# Patient Record
Sex: Male | Born: 1980 | Race: Black or African American | Hispanic: No | Marital: Single | Smoking: Former smoker
Health system: Southern US, Community
[De-identification: ages and names within clinical notes are randomized; demographics above are authoritative.]

## PROBLEM LIST (undated history)

## (undated) DIAGNOSIS — B029 Zoster without complications: Secondary | ICD-10-CM

## (undated) DIAGNOSIS — I469 Cardiac arrest, cause unspecified: Secondary | ICD-10-CM

## (undated) DIAGNOSIS — N139 Obstructive and reflux uropathy, unspecified: Secondary | ICD-10-CM

## (undated) DIAGNOSIS — Z9289 Personal history of other medical treatment: Secondary | ICD-10-CM

## (undated) DIAGNOSIS — A419 Sepsis, unspecified organism: Secondary | ICD-10-CM

## (undated) DIAGNOSIS — R7881 Bacteremia: Secondary | ICD-10-CM

## (undated) DIAGNOSIS — R6521 Severe sepsis with septic shock: Secondary | ICD-10-CM

## (undated) DIAGNOSIS — N4889 Other specified disorders of penis: Secondary | ICD-10-CM

## (undated) DIAGNOSIS — N289 Disorder of kidney and ureter, unspecified: Secondary | ICD-10-CM

## (undated) DIAGNOSIS — M79606 Pain in leg, unspecified: Secondary | ICD-10-CM

## (undated) DIAGNOSIS — B20 Human immunodeficiency virus [HIV] disease: Secondary | ICD-10-CM

## (undated) DIAGNOSIS — N200 Calculus of kidney: Secondary | ICD-10-CM

## (undated) DIAGNOSIS — T80211A Bloodstream infection due to central venous catheter, initial encounter: Secondary | ICD-10-CM

## (undated) DIAGNOSIS — R000 Tachycardia, unspecified: Secondary | ICD-10-CM

## (undated) DIAGNOSIS — N1330 Unspecified hydronephrosis: Secondary | ICD-10-CM

## (undated) DIAGNOSIS — C801 Malignant (primary) neoplasm, unspecified: Secondary | ICD-10-CM

## (undated) DIAGNOSIS — N189 Chronic kidney disease, unspecified: Secondary | ICD-10-CM

## (undated) DIAGNOSIS — I214 Non-ST elevation (NSTEMI) myocardial infarction: Secondary | ICD-10-CM

## (undated) DIAGNOSIS — K529 Noninfective gastroenteritis and colitis, unspecified: Secondary | ICD-10-CM

## (undated) DIAGNOSIS — J9601 Acute respiratory failure with hypoxia: Secondary | ICD-10-CM

## (undated) DIAGNOSIS — L989 Disorder of the skin and subcutaneous tissue, unspecified: Secondary | ICD-10-CM

## (undated) DIAGNOSIS — R740 Nonspecific elevation of levels of transaminase and lactic acid dehydrogenase [LDH]: Secondary | ICD-10-CM

## (undated) HISTORY — DX: Other specified disorders of penis: N48.89

## (undated) HISTORY — DX: Tachycardia, unspecified: R00.0

## (undated) HISTORY — DX: Pain in leg, unspecified: M79.606

## (undated) HISTORY — DX: Human immunodeficiency virus (HIV) disease: B20

## (undated) HISTORY — PX: OTHER SURGICAL HISTORY: SHX169

## (undated) HISTORY — DX: Disorder of the skin and subcutaneous tissue, unspecified: L98.9

---

## 2006-12-12 ENCOUNTER — Emergency Department (HOSPITAL_COMMUNITY): Admission: EM | Admit: 2006-12-12 | Discharge: 2006-12-12 | Payer: Self-pay | Admitting: Family Medicine

## 2007-09-24 ENCOUNTER — Emergency Department (HOSPITAL_COMMUNITY): Admission: EM | Admit: 2007-09-24 | Discharge: 2007-09-24 | Payer: Self-pay | Admitting: Family Medicine

## 2008-06-08 ENCOUNTER — Emergency Department (HOSPITAL_COMMUNITY): Admission: EM | Admit: 2008-06-08 | Discharge: 2008-06-09 | Payer: Self-pay | Admitting: Emergency Medicine

## 2008-08-24 HISTORY — PX: ABCESS DRAINAGE: SHX399

## 2009-07-14 ENCOUNTER — Emergency Department (HOSPITAL_COMMUNITY): Admission: EM | Admit: 2009-07-14 | Discharge: 2009-07-15 | Payer: Self-pay | Admitting: Emergency Medicine

## 2010-08-17 ENCOUNTER — Emergency Department (HOSPITAL_COMMUNITY)
Admission: EM | Admit: 2010-08-17 | Discharge: 2010-08-17 | Payer: Self-pay | Source: Home / Self Care | Admitting: Emergency Medicine

## 2010-09-06 ENCOUNTER — Encounter (HOSPITAL_BASED_OUTPATIENT_CLINIC_OR_DEPARTMENT_OTHER)
Admission: RE | Admit: 2010-09-06 | Discharge: 2010-09-06 | Disposition: A | Discharge status: Home / Self Care | Payer: Medicare Other | Source: Ambulatory Visit | Attending: General Surgery | Admitting: General Surgery

## 2010-09-06 DIAGNOSIS — Z01812 Encounter for preprocedural laboratory examination: Secondary | ICD-10-CM | POA: Insufficient documentation

## 2010-09-06 LAB — CBC
Hemoglobin: 11.6 g/dL — ABNORMAL LOW (ref 13.0–17.0)
MCH: 30.9 pg (ref 26.0–34.0)
MCHC: 33.8 g/dL (ref 30.0–36.0)
Platelets: 203 10*3/uL (ref 150–400)
RDW: 16.4 % — ABNORMAL HIGH (ref 11.5–15.5)
WBC: 5.2 10*3/uL (ref 4.0–10.5)

## 2010-09-06 LAB — DIFFERENTIAL
Lymphocytes Relative: 49 % — ABNORMAL HIGH (ref 12–46)
Lymphs Abs: 2.5 10*3/uL (ref 0.7–4.0)
Neutro Abs: 1.8 10*3/uL (ref 1.7–7.7)
Neutrophils Relative %: 35 % — ABNORMAL LOW (ref 43–77)

## 2010-09-06 LAB — COMPREHENSIVE METABOLIC PANEL
ALT: 24 U/L (ref 0–53)
Creatinine, Ser: 1.03 mg/dL (ref 0.4–1.5)
GFR calc Af Amer: >60 mL/min (ref >60)
GFR calc non Af Amer: >60 mL/min (ref >60)
Glucose, Bld: 98 mg/dL (ref 70–99)
Sodium: 136 mEq/L (ref 135–145)
Total Bilirubin: 0.2 mg/dL — ABNORMAL LOW (ref 0.3–1.2)

## 2010-09-07 ENCOUNTER — Ambulatory Visit (HOSPITAL_BASED_OUTPATIENT_CLINIC_OR_DEPARTMENT_OTHER)
Admission: RE | Admit: 2010-09-07 | Discharge: 2010-09-07 | Disposition: A | Discharge status: Home / Self Care | Payer: Medicare Other | Source: Ambulatory Visit | Attending: General Surgery | Admitting: General Surgery

## 2010-09-07 ENCOUNTER — Other Ambulatory Visit: Payer: Self-pay | Admitting: General Surgery

## 2010-09-07 DIAGNOSIS — K612 Anorectal abscess: Secondary | ICD-10-CM | POA: Insufficient documentation

## 2010-09-07 DIAGNOSIS — K603 Anal fistula, unspecified: Secondary | ICD-10-CM | POA: Insufficient documentation

## 2010-09-07 DIAGNOSIS — F17200 Nicotine dependence, unspecified, uncomplicated: Secondary | ICD-10-CM | POA: Insufficient documentation

## 2010-09-15 NOTE — Op Note (Signed)
NAME:  DARLY, FAILS NO.:  [id number]  MEDICAL RECORD NO.:  [id number]           PATIENT TYPE:  LOCATION:                                 FACILITY:  PHYSICIAN:  Anselm Pancoast. 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  PROCEDURES:  Examination under anesthesia, posterior fistulotomy, and drainage of posterior perirectal and kind of an early ischiorectal abscess.  ANESTHESIA:  General anesthesia.  SURGEON:  Anselm Pancoast. Zachery Dakins, MD  HISTORY:  Thomas [name] is a 30 year old Caucasian male who was referred to me from the emergency room recently with the following history.  He states that for the last 2-3 years he has had this "cyst" and cyst would reoccur.  This has been I and D'ed on several times. This has been over a period of 3-4 years and when I saw him in the office, he had been seen the in previous day or two, and he had not actually got his antibiotic filled.  On examination, you could see this posterior opening probably about an inch to an inch and half from the dentate line area, and I put a probe in the area and you could go and feel a little area of weak in the posterior midline.  He was such that it was not possible to get a seton on him in the office because of the pain, and I recommended that we plan on examining under anesthesia and then discussed with him that if we could identify the internal fistula opening, which I think we could, we will decide whether this could just be a fistulotomy or seton.  He is having a right much drainage and supposedly, he went ahead and got the doxycycline filled and I had scheduled him for the procedure and I think I saw him in the office about 4 days ago.  He is here today, his lab studies are unremarkable, his white count is not elevated  and on examination, you can see right much purulent drainage from this little external fistula opening.  He was taken back to the operative suite and given 1 g of Ancef.  Induction with an LMA tube and then placed up in the Yellowfin stirrups.  With pressing around the perirectal area, you can see purulence coming from this little external opening.  I prepped the external area with Betadine solution and then draped with towels in a sterile manner.  His legs were up in the Yellowfin stirrups and then I placed an anoscope into the anal canal and used a 10 mL syringe filled with Betadine with an Angiocath put it in the little external opening and squeezed it gently and you could see the Betadine solution coming up in this little posterior area where I thought the internal fistula opening was.  I then put a probe through the area and a kind of curved it and it comes up through the little opening and I bent the probe so I could hold it in place.  I then started on the external portion dividing the skin between these little internal and external openings and the most superficial external portion was just skin and subcutaneous tissue but then we got into the actual areas of the sphincters right in the posterior midline.  With the dividing partially weakening this and then I could see that I was getting really right much purulent drainage coming from the posterior aspect and I elected to go ahead and divide the little area, it is not a real high fistula but it does go around truly most of the posterior sphincter.  Then I went ahead and divided the little remaining portion and then you could see the little pocket to the left and right posterior to that.  I am sure he is having the start of a kind of a horseshoe-type abscess.  Then as far as after irrigating everything, washing it, and I used a few little 3-0 chromic sutures on the mucosal edges and cautery in the muscle area and then  reinspected and looked.  The little area, where I think the internal fistula opening is, had what looks like granulation tissue to me and I did send that as a path report.  I do not think this is going to be Crohn disease or anything.  Interesting note is that the patient's father had a similar problem that required 3 or 4 trips to the operating room and ultimately was managed by a colorectal surgeon in [place] but according to the patient's mother, father's was just a kind of a fistula-in-ano in a similar pattern of this reoccurring over several years time.  The procedure was completed, I put on a little 5% lidocaine ointment on a gauze and kind of packed in the little opening for pain control and a little bit of hemostasis and we will start sitz bathing later this evening and we will keep him on amoxicillin for few days.  I will see him back in the office next week.  I think this area will heal now hopefully without recurrent abscess, but the patient will have a little bit of inability to hold his stool for any prolonged periods of time if he would have diarrhea because of the portion of the sphincter that has been divided.    Anselm Pancoast. Zachery Dakins, M.D.     WJW/MEDQ  D:  09/07/2010  T:  09/08/2010  Job:  [id number]  Electronically Signed by Consuello Bossier M.D. on 09/15/2010 09:43:23 AM

## 2010-10-05 ENCOUNTER — Inpatient Hospital Stay (INDEPENDENT_AMBULATORY_CARE_PROVIDER_SITE_OTHER)
Admission: RE | Admit: 2010-10-05 | Discharge: 2010-10-05 | Disposition: A | Discharge status: Home / Self Care | Payer: Medicare Other | Source: Ambulatory Visit

## 2010-10-05 ENCOUNTER — Ambulatory Visit (INDEPENDENT_AMBULATORY_CARE_PROVIDER_SITE_OTHER): Payer: Medicare Other

## 2010-10-05 DIAGNOSIS — S93409A Sprain of unspecified ligament of unspecified ankle, initial encounter: Secondary | ICD-10-CM

## 2011-04-25 LAB — DIFFERENTIAL
Basophils Relative: 2 — ABNORMAL HIGH
Lymphs Abs: 2.4
Monocytes Absolute: 0.5
Monocytes Relative: 8

## 2011-04-25 LAB — RAPID URINE DRUG SCREEN, HOSP PERFORMED
Amphetamines: NOT DETECTED
Barbiturates: NOT DETECTED
Cocaine: NOT DETECTED
Tetrahydrocannabinol: POSITIVE — AB

## 2011-04-25 LAB — POCT CARDIAC MARKERS: CKMB, poc: 4.9

## 2011-04-25 LAB — CBC
HCT: 42.8
MCHC: 34.2
MCV: 99
Platelets: 213
RDW: 12.3

## 2012-02-19 ENCOUNTER — Emergency Department (INDEPENDENT_AMBULATORY_CARE_PROVIDER_SITE_OTHER)
Admission: EM | Admit: 2012-02-19 | Discharge: 2012-02-19 | Disposition: A | Discharge status: Home / Self Care | Payer: Medicare Other | Source: Home / Self Care | Attending: Emergency Medicine | Admitting: Emergency Medicine

## 2012-02-19 ENCOUNTER — Encounter (HOSPITAL_COMMUNITY): Payer: Self-pay | Admitting: Emergency Medicine

## 2012-02-19 DIAGNOSIS — M7750 Other enthesopathy of unspecified foot: Secondary | ICD-10-CM

## 2012-02-19 DIAGNOSIS — R209 Unspecified disturbances of skin sensation: Secondary | ICD-10-CM | POA: Diagnosis not present

## 2012-02-19 DIAGNOSIS — M659 Synovitis and tenosynovitis, unspecified: Secondary | ICD-10-CM

## 2012-02-19 DIAGNOSIS — R202 Paresthesia of skin: Secondary | ICD-10-CM

## 2012-02-19 MED ORDER — MELOXICAM 7.5 MG PO TABS: 7.5000 mg | ORAL_TABLET | Freq: Every day | ORAL | Status: DC

## 2012-02-19 NOTE — ED Provider Notes (Signed)
History     CSN: [id number]  Arrival date & time 02/19/12  1839   First MD Initiated Contact with Patient 02/19/12 1841      Chief Complaint  Patient presents with  . Foot Pain    (Consider location/radiation/quality/duration/timing/severity/associated sxs/prior treatment) HPI Comments: For about 1 year, patient been experiencing R foot pains and numbness to his toes with on and off swelling of his Rankle. Denies any previous injuries like fracatures or sprains that he can remember. He has not seek medical attention or taking anything for it".  The swelling and the numbness have not been constant, it comes and goes,   Patient is a 31 y.o. male presenting with lower extremity pain. The history is provided by the patient.  Foot Pain This is a chronic problem. The current episode started more than 1 week ago. The problem has not changed since onset.Nothing relieves the symptoms. He has tried nothing for the symptoms.    History reviewed. No pertinent past medical history.  History reviewed. No pertinent past surgical history.  No family history on file.  History  Substance Use Topics  . Smoking status: Current Everyday Smoker  . Smokeless tobacco: Not on file  . Alcohol Use: Yes      Review of Systems  Constitutional: Negative for fever, chills, activity change, appetite change and unexpected weight change.  Musculoskeletal: Positive for joint swelling. Negative for back pain and gait problem.  Skin: Negative for color change, pallor, rash and wound.  Neurological: Positive for numbness. Negative for weakness.    Allergies  Review of patient's allergies indicates no known allergies.  Home Medications   Current Outpatient Rx  Name Route Sig Dispense Refill  . MELOXICAM 7.5 MG PO TABS Oral Take 1 tablet (7.5 mg total) by mouth daily. 7 tablet 0    BP 126/89  Pulse 84  Temp 97.5 F (36.4 C) (Oral)  Resp 16  SpO2 100%  Physical Exam  Nursing note and vitals  reviewed. Constitutional: Vital signs are normal. He appears well-developed and well-nourished.  Non-toxic appearance. He does not have a sickly appearance. He does not appear ill.  HENT:  Head: Normocephalic.  Musculoskeletal: He exhibits no edema and no tenderness.       Right ankle: He exhibits normal range of motion, no swelling, no ecchymosis, no deformity, no laceration and normal pulse. No lateral malleolus, no medial malleolus, no AITFL, no CF ligament, no posterior TFL, no head of 5th metatarsal and no proximal fibula tenderness found. Achilles tendon normal.       Feet:  Neurological: He is alert. No cranial nerve deficit. He exhibits normal muscle tone. Coordination normal.  Skin: No rash noted. No erythema.    ED Course  Procedures (including critical care time)  Labs Reviewed - No data to display No results found.   1. Tendinitis of foot   2. Paresthesias       MDM  Foot intermittent swelling and paresthesias to dorsal aspect of R foot ( non-trauma). Meloxicam for 7 days and follow-up with [facility]        Jimmie Molly, MD 02/19/12 1958

## 2012-02-19 NOTE — ED Notes (Signed)
HERE WITH RIGHT FOOT SHARP INTERMIT PAIN RADIATING TO TOES WITH NUMBNESS X 1 YR BUT IS GETTING WORSE.STATES HE ROLLED ANKLE AND THE SWELLING AND DISCOMFORT NEVER IMPROVED.ALSO C/O NUMBNESS IN L FOOT ALSO

## 2012-02-20 ENCOUNTER — Emergency Department (HOSPITAL_COMMUNITY)
Admission: EM | Admit: 2012-02-20 | Discharge: 2012-02-20 | Disposition: A | Discharge status: Home / Self Care | Payer: Medicare Other | Attending: Emergency Medicine | Admitting: Emergency Medicine

## 2012-02-20 ENCOUNTER — Encounter (HOSPITAL_COMMUNITY): Payer: Self-pay | Admitting: Emergency Medicine

## 2012-02-20 DIAGNOSIS — M25579 Pain in unspecified ankle and joints of unspecified foot: Secondary | ICD-10-CM | POA: Insufficient documentation

## 2012-02-20 DIAGNOSIS — F17200 Nicotine dependence, unspecified, uncomplicated: Secondary | ICD-10-CM | POA: Insufficient documentation

## 2012-02-20 DIAGNOSIS — M545 Low back pain, unspecified: Secondary | ICD-10-CM | POA: Diagnosis not present

## 2012-02-20 DIAGNOSIS — M25571 Pain in right ankle and joints of right foot: Secondary | ICD-10-CM

## 2012-02-20 DIAGNOSIS — R109 Unspecified abdominal pain: Secondary | ICD-10-CM | POA: Diagnosis not present

## 2012-02-20 DIAGNOSIS — R197 Diarrhea, unspecified: Secondary | ICD-10-CM | POA: Diagnosis not present

## 2012-02-20 LAB — POCT I-STAT, CHEM 8
BUN: 18 mg/dL (ref 6–23)
Glucose, Bld: 113 mg/dL — ABNORMAL HIGH (ref 70–99)
HCT: 37 % — ABNORMAL LOW (ref 39.0–52.0)
Hemoglobin: 12.6 g/dL — ABNORMAL LOW (ref 13.0–17.0)
TCO2: 28 mmol/L (ref 0–100)

## 2012-02-20 LAB — URINALYSIS, ROUTINE W REFLEX MICROSCOPIC
Glucose, UA: NEGATIVE mg/dL
Leukocytes, UA: NEGATIVE
Nitrite: NEGATIVE
Urobilinogen, UA: 1 mg/dL (ref 0.0–1.0)

## 2012-02-20 LAB — GLUCOSE, CAPILLARY: Glucose-Capillary: 109 mg/dL — ABNORMAL HIGH (ref 70–99)

## 2012-02-20 LAB — URINE MICROSCOPIC-ADD ON

## 2012-02-20 NOTE — ED Provider Notes (Signed)
Medical screening examination/treatment/procedure(s) were performed by non-physician practitioner and as supervising physician I was immediately available for consultation/collaboration.  Doug Sou, MD 02/20/12 [phone]

## 2012-02-20 NOTE — ED Provider Notes (Signed)
History     CSN: [id number]  Arrival date & time 02/20/12  1120   First MD Initiated Contact with Patient 02/20/12 1339      Chief Complaint  Patient presents with  . low back pain   . Abdominal Pain  . Diarrhea  . Ankle Pain    (Consider location/radiation/quality/duration/timing/severity/associated sxs/prior treatment) HPI  31 y/o male INAD c/o right ankle pain x1 year. Pt has not tried any pain medications over that year. Denies remote trauma, Pain is 6/10 worse at night. Low back pain 7 months, Denies fever, Pt reports polyura x2 weeks. Diarrhea over 2 weeks. Abdominal pain x1week 3/10. Pt also reports 70 lb weight loss over 6 months. Denies night sweats, and easy bruising or swollen lymph nodes.  History reviewed. No pertinent past medical history.  History reviewed. No pertinent past surgical history.  No family history on file.  History  Substance Use Topics  . Smoking status: Current Everyday Smoker  . Smokeless tobacco: Not on file  . Alcohol Use: Yes      Review of Systems  Allergies  Review of patient's allergies indicates no known allergies.  Home Medications   Current Outpatient Rx  Name Route Sig Dispense Refill  . MELOXICAM 7.5 MG PO TABS Oral Take 1 tablet (7.5 mg total) by mouth daily. 7 tablet 0    BP 106/75  Pulse 105  Temp 98.5 F (36.9 C) (Oral)  Resp 18  SpO2 100%  Physical Exam  Constitutional: He is oriented to person, place, and time. He appears well-developed and well-nourished. No distress.  HENT:  Head: Normocephalic and atraumatic.  Right Ear: External ear normal.  Left Ear: External ear normal.  Nose: Nose normal.  Eyes: Conjunctivae and EOM are normal. Pupils are equal, round, and reactive to light. No scleral icterus.  Neck: Normal range of motion. Neck supple.  Cardiovascular: Normal rate, regular rhythm, normal heart sounds and intact distal pulses.   Pulmonary/Chest: Effort normal and breath sounds normal. No  respiratory distress. He has no wheezes. He has no rales. He exhibits no tenderness.  Abdominal: Soft. Bowel sounds are normal.  Musculoskeletal: Normal range of motion.  Neurological: He is alert and oriented to person, place, and time.  Skin: Skin is warm and dry. He is not diaphoretic.  Psychiatric: He has a normal mood and affect.    ED Course  Procedures (including critical care time)  Labs Reviewed  URINALYSIS, ROUTINE W REFLEX MICROSCOPIC - Abnormal; Notable for the following:    APPearance CLOUDY (*)     Hgb urine dipstick SMALL (*)     All other components within normal limits  URINE MICROSCOPIC-ADD ON - Abnormal; Notable for the following:    Squamous Epithelial / LPF FEW (*)     Bacteria, UA FEW (*)     All other components within normal limits  GLUCOSE, CAPILLARY - Abnormal; Notable for the following:    Glucose-Capillary 109 (*)     All other components within normal limits   No results found.   1. Right ankle pain       MDM  Patient presenting with multiple complaints. Urinalysis and i-STAT 9 within normal limits. I will Encourage patient to follow with primary care for more thorough evaluation.       Wynetta Emery, PA-C 02/20/12 1431

## 2012-02-20 NOTE — ED Notes (Signed)
Also states has night sweats, every night, denies any high risk behaviors,

## 2012-02-20 NOTE — ED Notes (Signed)
Multiple issues, states that he has lower back pain, body pain, abd pain. Wt loss in the past few months, pt states that he is not on drugs and that he wants to "just make sure he is ok" no n/v toady but in the past.

## 2012-02-20 NOTE — ED Notes (Signed)
States has lost a lot of weight in past few months, polyuria, thirsty all of the time, also has right foot, ankle, and leg pain.

## 2012-04-09 DIAGNOSIS — Z136 Encounter for screening for cardiovascular disorders: Secondary | ICD-10-CM | POA: Diagnosis not present

## 2012-04-09 DIAGNOSIS — M25579 Pain in unspecified ankle and joints of unspecified foot: Secondary | ICD-10-CM | POA: Diagnosis not present

## 2012-04-09 DIAGNOSIS — Z13220 Encounter for screening for lipoid disorders: Secondary | ICD-10-CM | POA: Diagnosis not present

## 2012-04-09 DIAGNOSIS — R634 Abnormal weight loss: Secondary | ICD-10-CM | POA: Diagnosis not present

## 2012-04-09 DIAGNOSIS — Z131 Encounter for screening for diabetes mellitus: Secondary | ICD-10-CM | POA: Diagnosis not present

## 2012-04-16 DIAGNOSIS — Z113 Encounter for screening for infections with a predominantly sexual mode of transmission: Secondary | ICD-10-CM | POA: Diagnosis not present

## 2012-04-16 DIAGNOSIS — L738 Other specified follicular disorders: Secondary | ICD-10-CM | POA: Diagnosis not present

## 2012-04-16 DIAGNOSIS — Z21 Asymptomatic human immunodeficiency virus [HIV] infection status: Secondary | ICD-10-CM | POA: Diagnosis not present

## 2012-05-09 DIAGNOSIS — R634 Abnormal weight loss: Secondary | ICD-10-CM | POA: Diagnosis not present

## 2012-05-09 DIAGNOSIS — Z21 Asymptomatic human immunodeficiency virus [HIV] infection status: Secondary | ICD-10-CM | POA: Diagnosis not present

## 2012-05-16 ENCOUNTER — Ambulatory Visit: Payer: Medicare Other

## 2012-05-17 ENCOUNTER — Telehealth: Payer: Self-pay

## 2012-05-17 NOTE — Telephone Encounter (Signed)
No show for intake. Phone number not working.  Will make Bridge Counselor Referral.    Laurell Josephs, RN

## 2012-05-24 ENCOUNTER — Telehealth: Payer: Self-pay

## 2012-05-24 NOTE — Telephone Encounter (Signed)
I spoke with patient for reschedule.  He states he will keep his June 06, 2012 appointment .   Laurell Josephs, RN

## 2012-06-06 ENCOUNTER — Other Ambulatory Visit (HOSPITAL_COMMUNITY)
Admission: RE | Admit: 2012-06-06 | Discharge: 2012-06-06 | Disposition: A | Discharge status: Home / Self Care | Payer: Medicare Other | Source: Ambulatory Visit | Attending: Infectious Diseases | Admitting: Infectious Diseases

## 2012-06-06 ENCOUNTER — Ambulatory Visit (INDEPENDENT_AMBULATORY_CARE_PROVIDER_SITE_OTHER): Payer: Medicare Other

## 2012-06-06 DIAGNOSIS — E785 Hyperlipidemia, unspecified: Secondary | ICD-10-CM | POA: Diagnosis not present

## 2012-06-06 DIAGNOSIS — Z21 Asymptomatic human immunodeficiency virus [HIV] infection status: Secondary | ICD-10-CM

## 2012-06-06 DIAGNOSIS — Z113 Encounter for screening for infections with a predominantly sexual mode of transmission: Secondary | ICD-10-CM | POA: Insufficient documentation

## 2012-06-06 DIAGNOSIS — Z23 Encounter for immunization: Secondary | ICD-10-CM | POA: Diagnosis not present

## 2012-06-06 DIAGNOSIS — B20 Human immunodeficiency virus [HIV] disease: Secondary | ICD-10-CM

## 2012-06-06 DIAGNOSIS — R3989 Other symptoms and signs involving the genitourinary system: Secondary | ICD-10-CM

## 2012-06-06 DIAGNOSIS — R198 Other specified symptoms and signs involving the digestive system and abdomen: Secondary | ICD-10-CM | POA: Diagnosis not present

## 2012-06-06 LAB — CBC WITH DIFFERENTIAL/PLATELET
MCH: 27.6 pg (ref 26.0–34.0)
MCHC: 32.8 g/dL (ref 30.0–36.0)
Neutro Abs: 1.6 10*3/uL — ABNORMAL LOW (ref 1.7–7.7)
Platelets: 186 10*3/uL (ref 150–400)
WBC: 4.5 10*3/uL (ref 4.0–10.5)

## 2012-06-07 LAB — HEPATITIS C ANTIBODY: HCV Ab: NEGATIVE

## 2012-06-07 LAB — LIPID PANEL
HDL: 12 mg/dL — ABNORMAL LOW (ref >39)
Triglycerides: 286 mg/dL — ABNORMAL HIGH (ref <150)

## 2012-06-07 LAB — T-HELPER CELL (CD4) - (RCID CLINIC ONLY)
CD4 % Helper T Cell: 39 % (ref 33–55)
CD4 T Cell Abs: 850 uL (ref 400–2700)

## 2012-06-07 LAB — COMPLETE METABOLIC PANEL WITH GFR
ALT: 16 U/L (ref 0–53)
AST: 33 U/L (ref 0–37)
Alkaline Phosphatase: 69 U/L (ref 39–117)
BUN: 13 mg/dL (ref 6–23)
CO2: 29 mEq/L (ref 19–32)
Calcium: 9.3 mg/dL (ref 8.4–10.5)
Creat: 0.92 mg/dL (ref 0.50–1.35)
GFR, Est African American: >89 mL/min
GFR, Est Non African American: >89 mL/min
Potassium: 3.7 mEq/L (ref 3.5–5.3)
Sodium: 131 mEq/L — ABNORMAL LOW (ref 135–145)
Total Protein: 9.9 g/dL — ABNORMAL HIGH (ref 6.0–8.3)

## 2012-06-07 LAB — URINALYSIS
Ketones, ur: NEGATIVE mg/dL
Leukocytes, UA: NEGATIVE
Nitrite: NEGATIVE
Protein, ur: 30 mg/dL — AB
Specific Gravity, Urine: 1.017 (ref 1.005–1.030)

## 2012-06-07 LAB — RPR

## 2012-06-07 LAB — HEPATITIS B CORE ANTIBODY, TOTAL: Hep B Core Total Ab: NEGATIVE

## 2012-06-07 LAB — HIV-1 RNA ULTRAQUANT REFLEX TO GENTYP+: HIV-1 RNA Quant, Log: 6 {Log} — ABNORMAL HIGH (ref <1.30)

## 2012-06-10 LAB — HERPES SIMPLEX VIRUS CULTURE: Organism ID, Bacteria: NOT DETECTED

## 2012-06-12 ENCOUNTER — Telehealth: Payer: Self-pay

## 2012-06-12 NOTE — Telephone Encounter (Signed)
Pt called to inform our office he did purchase Medicare Part D plan for $12.00. He will need the name and strengths of medication called to a representative that is asissting him .  Pt was informed he would not have this information until his December visit.    Thomas [name] K Thomas Empson,RN

## 2012-06-13 LAB — HIV-1 GENOTYPR PLUS

## 2012-06-14 NOTE — Progress Notes (Signed)
Pt reports weight loss of 100 lbs in the last  2 months.  He has complaints of night sweats for 1-2 years with insomnia.  He was very afraid and worried when he lost the weight so fast. His appetite is  normal and he eats regularly on a daily basis.  Pt stated he Father thought he was on crack.      Today he reports bumps present on his inner thigh and penis area.  The bumps per pt are raised bumps filled with "white "stiff". . Upon exam pt has 6-10 raised areas on inner right thigh and smaller bumps present on penis.  The bumps on his thigh appear to be the size pf a pencil eraser. To me it looks like molluscum contagiosum.  The  bumps present on penis  are smaller fluid filled bumps.    Cultures taken from thigh area and penile area.   Dr Luciana Axe will examine this area when he returns for office visit .  Laurell Josephs, RN

## 2012-06-25 ENCOUNTER — Other Ambulatory Visit: Payer: Self-pay | Admitting: *Deleted

## 2012-06-25 ENCOUNTER — Ambulatory Visit: Payer: Medicare Other

## 2012-06-25 ENCOUNTER — Encounter: Payer: Self-pay | Admitting: Internal Medicine

## 2012-06-25 ENCOUNTER — Ambulatory Visit (INDEPENDENT_AMBULATORY_CARE_PROVIDER_SITE_OTHER): Payer: Medicare Other | Admitting: Internal Medicine

## 2012-06-25 ENCOUNTER — Telehealth: Payer: Self-pay

## 2012-06-25 VITALS — BP 135/83 | HR 126 | Temp 97.4°F | Ht 73.0 in | Wt 144.0 lb

## 2012-06-25 DIAGNOSIS — B20 Human immunodeficiency virus [HIV] disease: Secondary | ICD-10-CM

## 2012-06-25 DIAGNOSIS — Z23 Encounter for immunization: Secondary | ICD-10-CM

## 2012-06-25 HISTORY — DX: Human immunodeficiency virus (HIV) disease: B20

## 2012-06-25 MED ORDER — SULFAMETHOXAZOLE-TMP DS 800-160 MG PO TABS: 1.0000 | ORAL_TABLET | Freq: Two times a day (BID) | ORAL | Status: DC

## 2012-06-25 MED ORDER — EFAVIRENZ-EMTRICITAB-TENOFOVIR 600-200-300 MG PO TABS: 1.0000 | ORAL_TABLET | Freq: Every day | ORAL | Status: DC

## 2012-06-25 MED ORDER — ENSURE PO LIQD: 237.0000 mL | Freq: Two times a day (BID) | ORAL | Status: DC

## 2012-06-25 NOTE — Progress Notes (Signed)
  Subjective:    Patient ID: Thomas [name], male    DOB: 1981/01/30, 31 y.o.   MRN: [id number]  HPI He comes in as a new patient. He was recently diagnosed by his primary physician with HIV. He was seen his physician do to about 2 months of significant weight loss and fatigue. He was concerned about diabetes at the time but the positive HIV was found. His CD4 is in the normal range and his viral load is over 1 million. He does have lesions on his penis and also rash on his face. He previously had never been tested for HIV. He has been seen in intake but is asking about his future her ability to have kids. He is here with his father who is aware of his diagnosis and is supportive. He has no other known medical problems. He did have a bacterial culture of his penile lesions or intake which have grown staph aureus. The HSV culture is negative. He does not have any pain or discharge in his penis.   Review of Systems  Constitutional: Positive for activity change, fatigue and unexpected weight change. Negative for fever, chills and appetite change.  HENT: Negative for sore throat and trouble swallowing.   Respiratory: Negative for cough and shortness of breath.   Cardiovascular: Negative for chest pain, palpitations and leg swelling.  Gastrointestinal: Negative for nausea, abdominal pain, diarrhea and abdominal distention.  Genitourinary: Positive for genital sores.  Musculoskeletal: Negative for myalgias, joint swelling and arthralgias.  Skin: Positive for rash.  Neurological: Negative for dizziness and headaches.  Hematological: Negative for adenopathy.       Objective:   Physical Exam  Constitutional: He appears well-developed and well-nourished. No distress.  HENT:  Mouth/Throat: Oropharynx is clear and moist. No oropharyngeal exudate.  Eyes: No scleral icterus.  Cardiovascular: Normal rate, regular rhythm and normal heart sounds.  Exam reveals no gallop and no friction rub.   No murmur  heard. Pulmonary/Chest: Effort normal and breath sounds normal. No respiratory distress. He has no wheezes. He has no rales.  Abdominal: Soft. Bowel sounds are normal. He exhibits no distension. There is no tenderness. There is no rebound.  Genitourinary:       Has multiple 1-2 mm lesions that have the appearance of molluscum contagiosum. No pustule lesions  Lymphadenopathy:    He has no cervical adenopathy.  Neurological: He is alert.  Skin:       He has a facial rash that is micropapular, no erythema  Psychiatric: He has a normal mood and affect. His behavior is normal.          Assessment & Plan:

## 2012-06-25 NOTE — Patient Instructions (Addendum)
Start the medicine today 

## 2012-06-25 NOTE — Telephone Encounter (Signed)
Patient came in to apply for SPAP (newly diagnosed - saw Dr today) and did not bring disability letter for 2013  income - was given a list of what to bring before appt., but said he could not find it - father was with patient and said they would go by social security office - so, rescheduled appt for 12/10 at 2pm as father said they needed time to get the letter - Tammy from [facility] called asking about it as his stepmother had called her - told her what happened and also said would check with Randolm Idol to see if he qualified for the emergency atripla for 1 month - was able to get it and called stepmother to pick up voucher and prescription at front desk - advised Tammy at [facility] as well.

## 2012-06-25 NOTE — Telephone Encounter (Signed)
Patient called to ask where his medication was sent to and advised him [facility] on [address]. He advised he needs it sent to the [facility] on [address]. Advised he will [facility] the [facility] at the [facility] today but in the future could we send meds to [facility], advised him yes. Changed the pharmacy in the system.

## 2012-06-25 NOTE — Progress Notes (Signed)
HPI: Thomas [name] is a 31 y.o. male with a hx of HIV who is here for ART initiation.  Allergies: No Known Allergies  Vitals: Temp: 97.4 F (36.3 C) (12/03 0859) Temp src: Oral (12/03 0859) BP: 135/83 mmHg (12/03 0859) Pulse Rate: 126  (12/03 0859)  Past Medical History: No past medical history on file.  Social History: History   Social History  . Marital Status: Single    Spouse Name: N/A    Number of Children: N/A  . Years of Education: N/A   Social History Main Topics  . Smoking status: Current Every Day Smoker -- 0.7 packs/day for 15 years    Types: Cigarettes  . Smokeless tobacco: Never Used  . Alcohol Use: 1.2 oz/week    2 Cans of beer per week     Comment: rarely   . Drug Use: Yes    Special: Marijuana  . Sexually Active: Yes    Birth Control/ Protection: Condom   Other Topics Concern  . None   Social History Narrative  . None    Home Medications:  (Not in a hospital admission)  Current Regimen: None  Labs: HIV 1 RNA Quant (copies/mL)  Date Value  06/06/2012 [id number]*     CD4 T Cell Abs (cmm)  Date Value  06/06/2012 850      Hep B S Ab (no units)  Date Value  06/06/2012 POS*     Hepatitis B Surface Ag (no units)  Date Value  06/06/2012 NEGATIVE      HCV Ab (no units)  Date Value  06/06/2012 NEGATIVE     CrCl: Estimated Creatinine Clearance: 109.4 ml/min (by C-G formula based on Cr of 0.92).  Lipids:    Component Value Date/Time   CHOL 98 06/06/2012 1450   TRIG 286* 06/06/2012 1450   HDL 12* 06/06/2012 1450   CHOLHDL 8.2 06/06/2012 1450   VLDL 57* 06/06/2012 1450   LDLCALC 29 06/06/2012 1450    Assessment: 31 yo with newly dx HIV who is here for ART initiation. He has decided to start Atripla. I discussed HIV process and why he needs to be on therapy. Explained adverse effects and what to watch out for. I told him that he needs to call the clinic if he has any issues before stopping any of his meds. He is also going to  be on septra.  Recommendations: Atripla 1 tab qHS  Clide Cliff, PharmD Clinical Infectious Disease Pharmacist [facility] 06/25/2012, 9:53 AM

## 2012-06-25 NOTE — Assessment & Plan Note (Addendum)
I have discussed the different options with the patient and discussed HIV at length. He also was seen by the pharmacist. In discussing different options, he has opted to start Atripla. He knows that it is sensitive to resistance mutations if he misses doses. He is motivated to take the medication daily. He noticed it taken on an empty stomach and he is aware of the side effects. He knows to call if he has any significant side effects including rash, abdominal pain or debilitating CNS side effects. He will have his labs checked in one month and I will see him one to 2 weeks after his labs. He is going to start tonight.  I did discuss with him the options of future care if he wants to get pregnant with a stable male partner. I did discuss that he needs to first be undetectable and get on medication and needs to disclose this information to his partner and consider at that time preexposure prophylaxis when the time comes.  More than 60 minutes was spent with the patient including face-to-face counseling, from the pharmacist and nurse coordinator and myself, exam and coronation of care.

## 2012-06-26 ENCOUNTER — Telehealth: Payer: Self-pay | Admitting: *Deleted

## 2012-06-26 NOTE — Telephone Encounter (Signed)
Pt reports the tips of his fingers hurting.  This symptom started prior to his HIV diagnosis.  Pt has a PCP.  RN advised pt to call PCP for an appt for this problem.

## 2012-06-28 ENCOUNTER — Other Ambulatory Visit: Payer: Self-pay | Admitting: *Deleted

## 2012-06-28 NOTE — Telephone Encounter (Signed)
Patient mother called to ask if he needs a refill on his Septra as he was only give 5 days worth. After speaking to the doctor told her the patient was only to take for 5 days.

## 2012-07-01 ENCOUNTER — Other Ambulatory Visit: Payer: Self-pay | Admitting: *Deleted

## 2012-07-01 DIAGNOSIS — B20 Human immunodeficiency virus [HIV] disease: Secondary | ICD-10-CM

## 2012-07-01 MED ORDER — ENSURE PO LIQD: 237.0000 mL | Freq: Two times a day (BID) | ORAL | Status: DC

## 2012-07-01 NOTE — Telephone Encounter (Signed)
Patient called and needed the Rx for his ensure sent to [facility]. Printed the Rx and sent it by [employer].

## 2012-07-02 ENCOUNTER — Ambulatory Visit: Payer: Medicare Other

## 2012-07-03 ENCOUNTER — Encounter: Payer: Self-pay | Admitting: *Deleted

## 2012-07-03 ENCOUNTER — Ambulatory Visit (INDEPENDENT_AMBULATORY_CARE_PROVIDER_SITE_OTHER): Payer: Medicare Other | Admitting: Internal Medicine

## 2012-07-03 ENCOUNTER — Other Ambulatory Visit: Payer: Self-pay | Admitting: Licensed Clinical Social Worker

## 2012-07-03 ENCOUNTER — Encounter: Payer: Self-pay | Admitting: Internal Medicine

## 2012-07-03 ENCOUNTER — Ambulatory Visit: Payer: Medicare Other | Admitting: Infectious Diseases

## 2012-07-03 ENCOUNTER — Ambulatory Visit: Payer: Medicare Other

## 2012-07-03 VITALS — BP 104/74 | HR 130 | Temp 97.3°F | Wt 143.0 lb

## 2012-07-03 DIAGNOSIS — L989 Disorder of the skin and subcutaneous tissue, unspecified: Secondary | ICD-10-CM

## 2012-07-03 DIAGNOSIS — M79609 Pain in unspecified limb: Secondary | ICD-10-CM | POA: Diagnosis not present

## 2012-07-03 DIAGNOSIS — M79606 Pain in leg, unspecified: Secondary | ICD-10-CM

## 2012-07-03 DIAGNOSIS — B20 Human immunodeficiency virus [HIV] disease: Secondary | ICD-10-CM

## 2012-07-03 HISTORY — DX: Disorder of the skin and subcutaneous tissue, unspecified: L98.9

## 2012-07-03 HISTORY — DX: Pain in leg, unspecified: M79.606

## 2012-07-03 MED ORDER — CEPHALEXIN 500 MG PO CAPS: 500.0000 mg | ORAL_CAPSULE | Freq: Four times a day (QID) | ORAL | Status: DC

## 2012-07-03 MED ORDER — EFAVIRENZ-EMTRICITAB-TENOFOVIR 600-200-300 MG PO TABS: 1.0000 | ORAL_TABLET | Freq: Every day | ORAL | Status: DC

## 2012-07-03 NOTE — Assessment & Plan Note (Signed)
I will have him sent for evaluation by sports medicine. No concerning signs or signs of vascular issues. This is chronic in nature. He knows that the appointment is for evaluation and is not for pain control or pain medicines.

## 2012-07-03 NOTE — Progress Notes (Signed)
  Subjective:    Patient ID: Thomas [name], male    DOB: Feb 20, 1981, 31 y.o.   MRN: [id number]  HPI Comes in here for evaluation of a neck wound. He developed scratch on his neck and began to scratch it and it began to get infected and he continued to pick at it. He has noted some puslike drainage. No fever or chills. He also complains of some bilateral leg pain which has been going on for months.   Review of Systems  Constitutional: Negative for fever and chills.  Musculoskeletal: Negative for back pain and gait problem.       Leg pain. It is not worsened with activity. It is constant and has been going on for a number of months.       Objective:   Physical Exam  Skin:       Neck with a dried lesion over his Adam's apple and to the right side. No drainage at this time. No surrounding erythema.          Assessment & Plan:

## 2012-07-03 NOTE — Assessment & Plan Note (Signed)
It looks like it is healing pretty well and is dry. I will however give him a course of Keflex since he does state that he did drain today.

## 2012-07-03 NOTE — Patient Instructions (Signed)
Pt to return to clinic this afternoon for appt w/ Dr. Luciana Axe to evaluate the non-healing skin lesion.

## 2012-07-03 NOTE — Progress Notes (Signed)
Patient ID: ALICIA ACKERT, male   DOB: 05/30/1981, 31 y.o.   MRN: [id number] Pt and mother "walked-in" to clinic this AM.  Pt c/o weeping, draining wound on anterior neck x 6 months.  Has been periodically treating it with peroxide.  Wound appears crusted with dry exudate, approximately 3 inches in length and 1/2 inches in width.  Pt stated that he had also had a lesion under his chin which healed after the pt "plucked a hair" from the lesion.  Requesting appt to examine the lesions.   Appt given for this afternoon w/ Dr. Luciana Axe.    Pt and mother asked this RN about cutting the Atripla tablet in half.  RN asked the pt why he needed to do this.  Pt responded "I have a problem swallowing the tablet without any H2O."  RN asked the pt why he was trying to swallow the tablet without any H2O.  Pt and mother responded that he was told to take the medicine on an empty stomach and stated that he thought that included H2O.   RN assured that pt that taking the tablet with H2O was OK just not food.  RN assured that pt that drinking H2O before, during and after taking the Atripla tablet would make the tablet easier to swallow so that splitting the tablet would be unnecessary.  The pt and his mother verbalized understanding of this information and will start taking the Atripla with H2O.

## 2012-07-15 ENCOUNTER — Other Ambulatory Visit: Payer: Self-pay | Admitting: Licensed Clinical Social Worker

## 2012-07-15 DIAGNOSIS — B20 Human immunodeficiency virus [HIV] disease: Secondary | ICD-10-CM

## 2012-07-15 MED ORDER — EFAVIRENZ-EMTRICITAB-TENOFOVIR 600-200-300 MG PO TABS: 1.0000 | ORAL_TABLET | Freq: Every day | ORAL | Status: DC

## 2012-07-22 ENCOUNTER — Other Ambulatory Visit: Payer: Self-pay | Admitting: *Deleted

## 2012-07-22 ENCOUNTER — Other Ambulatory Visit (INDEPENDENT_AMBULATORY_CARE_PROVIDER_SITE_OTHER): Payer: Medicare Other

## 2012-07-22 DIAGNOSIS — B20 Human immunodeficiency virus [HIV] disease: Secondary | ICD-10-CM | POA: Diagnosis not present

## 2012-07-22 LAB — CBC WITH DIFFERENTIAL/PLATELET
Basophils Absolute: 0.1 10*3/uL (ref 0.0–0.1)
Eosinophils Relative: 5 % (ref 0–5)
HCT: 28 % — ABNORMAL LOW (ref 39.0–52.0)
Hemoglobin: 9.4 g/dL — ABNORMAL LOW (ref 13.0–17.0)
Lymphocytes Relative: 49 % — ABNORMAL HIGH (ref 12–46)
Lymphs Abs: 2.7 10*3/uL (ref 0.7–4.0)
MCV: 86.7 fL (ref 78.0–100.0)
Monocytes Absolute: 0.6 10*3/uL (ref 0.1–1.0)
Monocytes Relative: 11 % (ref 3–12)
RDW: 18.1 % — ABNORMAL HIGH (ref 11.5–15.5)
WBC: 5.5 10*3/uL (ref 4.0–10.5)

## 2012-07-22 MED ORDER — ENSURE PO LIQD: 237.0000 mL | Freq: Two times a day (BID) | ORAL | Status: DC

## 2012-07-23 LAB — COMPLETE METABOLIC PANEL WITH GFR
Albumin: 2.8 g/dL — ABNORMAL LOW (ref 3.5–5.2)
BUN: 19 mg/dL (ref 6–23)
CO2: 27 mEq/L (ref 19–32)
Calcium: 8.7 mg/dL (ref 8.4–10.5)
Chloride: 96 mEq/L (ref 96–112)
Creat: 1.1 mg/dL (ref 0.50–1.35)
GFR, Est African American: >89 mL/min
GFR, Est Non African American: >89 mL/min
Glucose, Bld: 136 mg/dL — ABNORMAL HIGH (ref 70–99)
Potassium: 3.7 mEq/L (ref 3.5–5.3)

## 2012-07-23 LAB — T-HELPER CELL (CD4) - (RCID CLINIC ONLY)
CD4 % Helper T Cell: 46 % (ref 33–55)
CD4 T Cell Abs: 1110 uL (ref 400–2700)

## 2012-08-06 ENCOUNTER — Other Ambulatory Visit: Payer: Self-pay

## 2012-08-06 ENCOUNTER — Other Ambulatory Visit: Payer: Self-pay | Admitting: *Deleted

## 2012-08-06 ENCOUNTER — Encounter: Payer: Self-pay | Admitting: Internal Medicine

## 2012-08-06 ENCOUNTER — Ambulatory Visit (INDEPENDENT_AMBULATORY_CARE_PROVIDER_SITE_OTHER): Payer: Medicare Other | Admitting: Internal Medicine

## 2012-08-06 ENCOUNTER — Other Ambulatory Visit: Payer: Self-pay | Admitting: Internal Medicine

## 2012-08-06 ENCOUNTER — Ambulatory Visit (HOSPITAL_COMMUNITY)
Admission: RE | Admit: 2012-08-06 | Discharge: 2012-08-06 | Disposition: A | Discharge status: Home / Self Care | Payer: Medicare Other | Source: Ambulatory Visit | Attending: Internal Medicine | Admitting: Internal Medicine

## 2012-08-06 VITALS — BP 130/83 | HR 125 | Temp 98.3°F | Ht 73.0 in | Wt 142.0 lb

## 2012-08-06 DIAGNOSIS — I498 Other specified cardiac arrhythmias: Secondary | ICD-10-CM | POA: Diagnosis not present

## 2012-08-06 DIAGNOSIS — R000 Tachycardia, unspecified: Secondary | ICD-10-CM | POA: Insufficient documentation

## 2012-08-06 DIAGNOSIS — L989 Disorder of the skin and subcutaneous tissue, unspecified: Secondary | ICD-10-CM | POA: Diagnosis not present

## 2012-08-06 DIAGNOSIS — B20 Human immunodeficiency virus [HIV] disease: Secondary | ICD-10-CM | POA: Diagnosis not present

## 2012-08-06 DIAGNOSIS — N4889 Other specified disorders of penis: Secondary | ICD-10-CM

## 2012-08-06 HISTORY — DX: Other specified disorders of penis: N48.89

## 2012-08-06 HISTORY — DX: Tachycardia, unspecified: R00.0

## 2012-08-06 LAB — ANEMIA PANEL
%SAT: 22 % (ref 20–55)
ABS Retic: 66.2 10*3/uL (ref 19.0–186.0)
Ferritin: 561 ng/mL — ABNORMAL HIGH (ref 22–322)
TIBC: 226 ug/dL (ref 215–435)
Vitamin B-12: 438 pg/mL (ref 211–911)

## 2012-08-06 LAB — TSH: TSH: 4.687 u[IU]/mL — ABNORMAL HIGH (ref 0.350–4.500)

## 2012-08-06 MED ORDER — VALACYCLOVIR HCL 1 G PO TABS: 1000.0000 mg | ORAL_TABLET | Freq: Two times a day (BID) | ORAL | Status: DC

## 2012-08-06 MED ORDER — EFAVIRENZ-EMTRICITAB-TENOFOVIR 600-200-300 MG PO TABS: 1.0000 | ORAL_TABLET | Freq: Every day | ORAL | Status: DC

## 2012-08-06 MED ORDER — CEPHALEXIN 500 MG PO CAPS: 500.0000 mg | ORAL_CAPSULE | Freq: Four times a day (QID) | ORAL | Status: DC

## 2012-08-06 NOTE — Assessment & Plan Note (Signed)
He is doing well with his Atripla with good tolerance. I will have him repeat the labs in about 6 weeks to assure that it is undetectable. He was reminded to use condoms

## 2012-08-06 NOTE — Assessment & Plan Note (Addendum)
His leg does have a painful area that does touch his scrotum. I'm concerned first with HSV and will give him a course of Valtrex. It also could be a boil an abscess collection. If it is not improved with the Valtrex or the Keflex which she is taking for his face, total consider having him referred to surgery to do an incision and drainage. He also has some 1-2 mm lesions on the ventral side of his penis. They are nontender. They may be molluscum or HPV. They should resolve with improving his HIV virus. If not he may need referral for dermatology.

## 2012-08-06 NOTE — Assessment & Plan Note (Signed)
He is asymptomatic for his tachycardia. I did check an EKG which shows sinus tachycardia though I note that appears to be some early repolarization in 2 and 3. It is her also read as left atrial enlargement possible. With the subtle EKG findings, I do not find anything concerning for acute intervention in particular since he is asymptomatic. It is not consistent with SVT and is regular. I am going to check a TSH and have him evaluated by cardiology since this has been persistent. His initial heart rate was 141 the EKG is 109. She does smoke which could be a cause.

## 2012-08-06 NOTE — Progress Notes (Signed)
  Subjective:    Patient ID: Thomas [name], male    DOB: March 05, 1981, 31 y.o.   MRN: [id number]  HPI He comes in for evaluation of his HIV. In December, he started on Atripla and reports good compliance and excellent tolerance of the medication. He denies any missed doses. His viral load is responded well. His CD4 count is now over 1000. He does complain of continued problems with his skin on his face around his beard. He has some drainage noted and a new area. She thinks it is related to his barber who does not clean his instruments. He also complains of a painful lesion on his right inner thigh near his scrotum and some lesions on his penis. No fever or chills.    He is also noted to be tachycardic as he has been in the past. He has not had this evaluated by his primary physician. He does smoke. He denies any drug use, other than marijuana.     Review of Systems  Constitutional: Positive for activity change and unexpected weight change. Negative for fever, chills and fatigue.  HENT: Negative for sore throat and trouble swallowing.   Respiratory: Negative for cough and shortness of breath.   Cardiovascular: Negative for chest pain, palpitations and leg swelling.  Gastrointestinal: Negative for nausea, abdominal pain and diarrhea.  Musculoskeletal: Negative for myalgias and arthralgias.  Skin: Negative for rash.       He does have a lesion in the right lower area with some hyper keratosis in mild drainage.  Neurological: Negative for dizziness and headaches.       Objective:   Physical Exam  Constitutional: He appears well-developed and well-nourished. No distress.  HENT:  Mouth/Throat: Oropharynx is clear and moist. No oropharyngeal exudate.  Cardiovascular: Regular rhythm and normal heart sounds.  Exam reveals no gallop and no friction rub.   No murmur heard.      tachycardia  Pulmonary/Chest: Effort normal and breath sounds normal. No respiratory distress. He has no wheezes. He has  no rales.  Skin: No rash noted.          Assessment & Plan:

## 2012-08-06 NOTE — Assessment & Plan Note (Signed)
He has another lesion on his face that I suspect is related to his beard. We'll give him another course of Keflex. Advised him to make sure that his barber has clean instruments.

## 2012-08-06 NOTE — Patient Instructions (Signed)
Follow up with cardiology regarding her heart rate

## 2012-08-07 LAB — DRUG SCREEN, URINE
Barbiturate Quant, Ur: NEGATIVE
Creatinine,U: 172.42 mg/dL
Marijuana Metabolite: POSITIVE — AB
Opiates: NEGATIVE
Phencyclidine (PCP): NEGATIVE
Propoxyphene: NEGATIVE

## 2012-08-07 LAB — HSV(HERPES SMPLX)ABS-I+II(IGG+IGM)-BLD
HSV 1 Glycoprotein G Ab, IgG: 0.48 IV
HSV 2 Glycoprotein G Ab, IgG: 22.5 IV — ABNORMAL HIGH

## 2012-08-07 LAB — T4: T4, Total: 5.6 ug/dL (ref 5.0–12.5)

## 2012-08-13 ENCOUNTER — Encounter: Payer: Self-pay | Admitting: *Deleted

## 2012-08-15 DIAGNOSIS — G4700 Insomnia, unspecified: Secondary | ICD-10-CM | POA: Diagnosis not present

## 2012-08-15 DIAGNOSIS — Z21 Asymptomatic human immunodeficiency virus [HIV] infection status: Secondary | ICD-10-CM | POA: Diagnosis not present

## 2012-08-15 DIAGNOSIS — M79609 Pain in unspecified limb: Secondary | ICD-10-CM | POA: Diagnosis not present

## 2012-08-16 ENCOUNTER — Ambulatory Visit: Payer: Medicare Other | Admitting: Cardiovascular Disease

## 2012-08-23 ENCOUNTER — Ambulatory Visit (INDEPENDENT_AMBULATORY_CARE_PROVIDER_SITE_OTHER): Payer: Medicare Other | Admitting: Cardiovascular Disease

## 2012-08-23 ENCOUNTER — Encounter: Payer: Self-pay | Admitting: Cardiovascular Disease

## 2012-08-23 VITALS — BP 126/82 | HR 114 | Wt 139.0 lb

## 2012-08-23 DIAGNOSIS — R000 Tachycardia, unspecified: Secondary | ICD-10-CM | POA: Diagnosis not present

## 2012-08-23 DIAGNOSIS — I498 Other specified cardiac arrhythmias: Secondary | ICD-10-CM

## 2012-08-23 DIAGNOSIS — B20 Human immunodeficiency virus [HIV] disease: Secondary | ICD-10-CM

## 2012-08-23 NOTE — Assessment & Plan Note (Signed)
No evidence of structural heart disease.  Normal ECG and exam  Check 24 hour monitor to document normal circadian variation and Echo to r/o structural heart disease.  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

## 2012-08-23 NOTE — Assessment & Plan Note (Signed)
F/U Dr Luciana Axe  Needs further w/u of anemia  Continue antiretrovirals

## 2012-08-23 NOTE — Progress Notes (Signed)
Patient ID: Thomas [name], male   DOB: 02/25/1981, 32 y.o.   MRN: [id number] 32 yo HIV.  Denies drugs but screen positive for marajuana.  Contracted HIV from sexual encounter Referred for tachycardia  Review of ECG seems chronic.  Rates around 100 More anemic last 6 months with low folate Hct now 28 which is contributing.  No fever or opportunistic infection.  Always tired Sleeps most of Thomas Has no energy.  Has atypical sharp chest pains.  Positional No dyspnea. Sees Dr Luciana Axe for HIV.  No history of congenital heart disease. No syncope.  Has lost a lot of weight in last year.    ROS: Denies fever, malais, weight loss, blurry vision, decreased visual acuity, cough, sputum, SOB, hemoptysis, pleuritic pain, palpitaitons, heartburn, abdominal pain, melena, lower extremity edema, claudication, or rash.  All other systems reviewed and negative   General: Affect appropriate Thin black male HEENT: normal Neck supple with no adenopathy JVP normal no bruits no thyromegaly Lungs clear with no wheezing and good diaphragmatic motion Heart:  S1/S2 no murmur,rub, gallop or click PMI normal Abdomen: benighn, BS positve, no tenderness, no AAA no bruit.  No HSM or HJR Distal pulses intact with no bruits No edema Neuro non-focal Skin warm and dry No muscular weakness  Medications Current Outpatient Prescriptions  Medication Sig Dispense Refill  . cephALEXin (KEFLEX) 500 MG capsule Take 1 capsule (500 mg total) by mouth 4 (four) times daily.  28 capsule  1  . efavirenz-emtricitabine-tenofovir (ATRIPLA) 600-200-300 MG per tablet Take 1 tablet by mouth at bedtime.  30 tablet  6  . ENSURE (ENSURE) Take 237 mLs by mouth 2 (two) times daily between meals.  237 mL  11  . valACYclovir (VALTREX) 1000 MG tablet Take 1 tablet (1,000 mg total) by mouth 2 (two) times daily.  14 tablet  2    Allergies Review of patient's allergies indicates no known allergies.  Family History: No family history on file.  Social  History: History   Social History  . Marital Status: Single    Spouse Name: N/A    Number of Children: N/A  . Years of Education: N/A   Occupational History  . Not on file.   Social History Main Topics  . Smoking status: Current Every Thomas Smoker -- 0.7 packs/Thomas for 15 years    Types: Cigarettes  . Smokeless tobacco: Never Used  . Alcohol Use: 1.2 oz/week    2 Cans of beer per week     Comment: rarely   . Drug Use: Yes    Special: Marijuana  . Sexually Active: Yes    Birth Control/ Protection: Condom   Other Topics Concern  . Not on file   Social History Narrative  . No narrative on file    Electrocardiogram:  SR/ST normal otherwise  Assessment and Plan

## 2012-08-23 NOTE — Patient Instructions (Signed)
Your physician recommends that you schedule a follow-up appointment in: AS NEEDED Your physician recommends that you continue on your current medications as directed. Please refer to the Current Medication list given to you today.  Your physician has requested that you have an echocardiogram. Echocardiography is a painless test that uses sound waves to create images of your heart. It provides your doctor with information about the size and shape of your heart and how well your heart's chambers and valves are working. This procedure takes approximately one hour. There are no restrictions for this procedure.  Your physician has recommended that you wear a holter monitor. Holter monitors are medical devices that record the heart's electrical activity. Doctors most often use these monitors to diagnose arrhythmias. Arrhythmias are problems with the speed or rhythm of the heartbeat. The monitor is a small, portable device. You can wear one while you do your normal daily activities. This is usually used to diagnose what is causing palpitations/syncope (passing out).

## 2012-09-02 ENCOUNTER — Telehealth: Payer: Self-pay | Admitting: *Deleted

## 2012-09-02 ENCOUNTER — Ambulatory Visit (HOSPITAL_COMMUNITY): Payer: Medicare Other | Attending: Cardiology | Admitting: Radiology

## 2012-09-02 ENCOUNTER — Encounter (INDEPENDENT_AMBULATORY_CARE_PROVIDER_SITE_OTHER): Payer: Medicare Other

## 2012-09-02 ENCOUNTER — Other Ambulatory Visit: Payer: Self-pay | Admitting: *Deleted

## 2012-09-02 DIAGNOSIS — I498 Other specified cardiac arrhythmias: Secondary | ICD-10-CM

## 2012-09-02 DIAGNOSIS — B20 Human immunodeficiency virus [HIV] disease: Secondary | ICD-10-CM | POA: Diagnosis not present

## 2012-09-02 DIAGNOSIS — R000 Tachycardia, unspecified: Secondary | ICD-10-CM

## 2012-09-02 DIAGNOSIS — I471 Supraventricular tachycardia: Secondary | ICD-10-CM | POA: Diagnosis not present

## 2012-09-02 DIAGNOSIS — F17200 Nicotine dependence, unspecified, uncomplicated: Secondary | ICD-10-CM | POA: Insufficient documentation

## 2012-09-02 DIAGNOSIS — R079 Chest pain, unspecified: Secondary | ICD-10-CM | POA: Insufficient documentation

## 2012-09-02 DIAGNOSIS — R072 Precordial pain: Secondary | ICD-10-CM

## 2012-09-02 NOTE — Telephone Encounter (Signed)
24 hr holter monitor placed on pt 09/02/12 TK

## 2012-09-02 NOTE — Progress Notes (Signed)
Echocardiogram performed.  

## 2012-09-03 DIAGNOSIS — M25579 Pain in unspecified ankle and joints of unspecified foot: Secondary | ICD-10-CM | POA: Diagnosis not present

## 2012-09-03 DIAGNOSIS — M79609 Pain in unspecified limb: Secondary | ICD-10-CM | POA: Diagnosis not present

## 2012-09-17 ENCOUNTER — Telehealth: Payer: Self-pay | Admitting: *Deleted

## 2012-09-17 MED ORDER — ATENOLOL 25 MG PO TABS: 25.0000 mg | ORAL_TABLET | Freq: Every day | ORAL | Status: DC

## 2012-09-17 NOTE — Telephone Encounter (Signed)
CALLED PT RE MONITOR PER DR NISHAN   MIN HR  100 START ATENOLOL 25 MG  QD AND FOLLOW UP WITH EP APPT MADE WITH DR Johney Frame  FOR 10-07-12 AT 10:45 AM MED SENT  TO [facility] VIA EPIC .Zack Seal

## 2012-09-26 ENCOUNTER — Other Ambulatory Visit: Payer: Medicare Other

## 2012-09-26 ENCOUNTER — Ambulatory Visit: Payer: Medicare Other

## 2012-09-26 DIAGNOSIS — B20 Human immunodeficiency virus [HIV] disease: Secondary | ICD-10-CM

## 2012-09-26 DIAGNOSIS — F329 Major depressive disorder, single episode, unspecified: Secondary | ICD-10-CM | POA: Diagnosis not present

## 2012-09-26 DIAGNOSIS — M25579 Pain in unspecified ankle and joints of unspecified foot: Secondary | ICD-10-CM | POA: Diagnosis not present

## 2012-09-26 DIAGNOSIS — Z21 Asymptomatic human immunodeficiency virus [HIV] infection status: Secondary | ICD-10-CM | POA: Diagnosis not present

## 2012-09-27 LAB — T-HELPER CELL (CD4) - (RCID CLINIC ONLY): CD4 % Helper T Cell: 51 % (ref 33–55)

## 2012-09-29 LAB — HIV-1 RNA QUANT-NO REFLEX-BLD
HIV 1 RNA Quant: 33 copies/mL — ABNORMAL HIGH (ref <20)
HIV-1 RNA Quant, Log: 1.52 {Log} — ABNORMAL HIGH (ref <1.30)

## 2012-10-01 DIAGNOSIS — G589 Mononeuropathy, unspecified: Secondary | ICD-10-CM | POA: Diagnosis not present

## 2012-10-07 ENCOUNTER — Ambulatory Visit (INDEPENDENT_AMBULATORY_CARE_PROVIDER_SITE_OTHER): Payer: Medicare Other | Admitting: Internal Medicine

## 2012-10-07 ENCOUNTER — Encounter: Payer: Self-pay | Admitting: Internal Medicine

## 2012-10-07 VITALS — BP 102/63 | HR 97 | Wt 136.1 lb

## 2012-10-07 DIAGNOSIS — D649 Anemia, unspecified: Secondary | ICD-10-CM

## 2012-10-07 DIAGNOSIS — I498 Other specified cardiac arrhythmias: Secondary | ICD-10-CM | POA: Diagnosis not present

## 2012-10-07 DIAGNOSIS — R000 Tachycardia, unspecified: Secondary | ICD-10-CM

## 2012-10-07 NOTE — Patient Instructions (Addendum)
Your physician recommends that you schedule a follow-up appointment as needed with Dr Johney Frame   Keep hydrated, avoid alcohol and smoking

## 2012-10-10 ENCOUNTER — Ambulatory Visit (INDEPENDENT_AMBULATORY_CARE_PROVIDER_SITE_OTHER): Payer: Medicare Other | Admitting: Internal Medicine

## 2012-10-10 ENCOUNTER — Encounter: Payer: Self-pay | Admitting: Internal Medicine

## 2012-10-10 VITALS — BP 118/78 | HR 96 | Temp 97.9°F | Ht 73.0 in | Wt 135.0 lb

## 2012-10-10 DIAGNOSIS — B20 Human immunodeficiency virus [HIV] disease: Secondary | ICD-10-CM

## 2012-10-10 DIAGNOSIS — D649 Anemia, unspecified: Secondary | ICD-10-CM | POA: Diagnosis not present

## 2012-10-10 DIAGNOSIS — I498 Other specified cardiac arrhythmias: Secondary | ICD-10-CM

## 2012-10-10 DIAGNOSIS — R000 Tachycardia, unspecified: Secondary | ICD-10-CM

## 2012-10-10 LAB — CBC WITH DIFFERENTIAL/PLATELET
Basophils Absolute: 0.1 10*3/uL (ref 0.0–0.1)
Eosinophils Relative: 1 % (ref 0–5)
Lymphocytes Relative: 49 % — ABNORMAL HIGH (ref 12–46)
Neutro Abs: 1.9 10*3/uL (ref 1.7–7.7)
Platelets: 180 10*3/uL (ref 150–400)
RDW: 16 % — ABNORMAL HIGH (ref 11.5–15.5)
WBC: 5.1 10*3/uL (ref 4.0–10.5)

## 2012-10-10 LAB — RETICULOCYTES: RBC.: 3.42 MIL/uL — ABNORMAL LOW (ref 4.22–5.81)

## 2012-10-11 ENCOUNTER — Telehealth: Payer: Self-pay | Admitting: *Deleted

## 2012-10-11 DIAGNOSIS — D649 Anemia, unspecified: Secondary | ICD-10-CM | POA: Insufficient documentation

## 2012-10-11 NOTE — Telephone Encounter (Signed)
Patient notified and he is ok with waiting and watching his labs. Thomas [name]

## 2012-10-11 NOTE — Assessment & Plan Note (Signed)
He is doing well on Atripla and nearly undetectable. RTC in 3 months.

## 2012-10-11 NOTE — Assessment & Plan Note (Signed)
Likely related to HIV with a relatively low retic with some bone marrow suppression.  It has improved some and will continue to monitor.  I will consider hematology input if it does not continue improving some.  It is normocytic so less likely genetic.

## 2012-10-11 NOTE — Assessment & Plan Note (Signed)
Improved on atenolol

## 2012-10-11 NOTE — Telephone Encounter (Signed)
Message copied by Macy Mis on Fri Oct 11, 2012  2:58 PM ------      Message from: Gardiner Barefoot      Created: Fri Oct 11, 2012  2:08 PM       The note is in, though I actually changed my mind.  His anemia is a bit better and is likely related to the HIV that now is getting in better control.  We can hold off on the referral and consider if it doesn't continue to improve.  If he really wants to though, he can be referred since we talked about it.  Thanks            ----- Message -----         From: Wendall Mola, CMA         Sent: 10/11/2012  11:07 AM           To: Gardiner Barefoot, MD            Can you please finish yesterdays note so I can send to hematology      Thank you       ------

## 2012-10-11 NOTE — Progress Notes (Signed)
  Subjective:    Patient ID: Thomas [name], male    DOB: 12-23-1980, 32 y.o.   MRN: [id number]  HPI Here for follow up of his hiv.  Started on Atripla in December of 2013 and reports excellent compliance with no missed doses and good tolerance.  No rashes.  He has been evaluated by cardiology for his tachycardia and holter noted to confirm persistent tachycardia.  He was then started on Atenolol and currently is without tachycardia.  He never developed symptoms.  He also has had anemia with a little folate deficiency.     Review of Systems  Constitutional: Negative for fever, fatigue and unexpected weight change.  HENT: Negative for sore throat and trouble swallowing.   Respiratory: Negative for cough and shortness of breath.   Cardiovascular: Negative for chest pain, palpitations and leg swelling.  Gastrointestinal: Negative for nausea, abdominal pain and diarrhea.  Musculoskeletal: Negative for myalgias, joint swelling and arthralgias.  Skin: Negative for rash.  Neurological: Negative for dizziness and headaches.       Objective:   Physical Exam  Constitutional: He appears well-developed and well-nourished. No distress.  HENT:  Mouth/Throat: Oropharynx is clear and moist. No oropharyngeal exudate.  Cardiovascular: Normal rate, regular rhythm and normal heart sounds.  Exam reveals no gallop and no friction rub.   No murmur heard. Not tachycardic  Pulmonary/Chest: Effort normal and breath sounds normal. No respiratory distress. He has no wheezes. He has no rales.  Skin: No rash noted.          Assessment & Plan:

## 2012-10-20 NOTE — Progress Notes (Signed)
Primary Care Physician: Dorrene German, MD Referring Physician:  Dr Thomas [name] Thomas [name] is a 32 y.o. male with a h/o sinus tachycardia who presents for EP consultation.  The patient has a h/o HIV for which he is medically managed.  He has been observed to have chronic sinus tachycardia. He presented to Dr [name] for consultation.  At that time, he reported occasional sharp chest pains as well as fatigue.  He had an event monitor placed which revealed sinus rhythm with an average heart rate of 102 bpm (range 69-172 bpm).  He was placed on low dose atenolol.  He has done very well since that time.  His chest pain and fatigue have both resolved.  He is doing well from a CV standpoint.  He has poor PO intake chronically and was lost a lot of weight in last year.    Today, he denies symptoms of palpitations, further chest pain, shortness of breath, orthopnea, PND, lower extremity edema, dizziness, presyncope, syncope, or neurologic sequela. The patient is tolerating medications without difficulties and is otherwise without complaint today.   Past Medical History  Diagnosis Date  . HIV disease 06/25/2012  . Skin lesion 07/03/2012  . Leg pain 07/03/2012  . Sinus tachycardia 08/06/2012  . Penile cyst 08/06/2012   No past surgical history on file.  Current Outpatient Prescriptions  Medication Sig Dispense Refill  . atenolol (TENORMIN) 25 MG tablet Take 1 tablet (25 mg total) by mouth daily.  30 tablet  6  . efavirenz-emtricitabine-tenofovir (ATRIPLA) 600-200-300 MG per tablet Take 1 tablet by mouth at bedtime.  30 tablet  6  . ENSURE (ENSURE) Take 237 mLs by mouth 2 (two) times daily between meals.  237 mL  11  . gabapentin (NEURONTIN) 300 MG capsule       . valACYclovir (VALTREX) 1000 MG tablet Take 1 tablet (1,000 mg total) by mouth 2 (two) times daily.  14 tablet  2   No current facility-administered medications for this visit.    No Known Allergies  History   Social History  .  Marital Status: Single    Spouse Name: N/A    Number of Children: N/A  . Years of Education: N/A   Occupational History  . Not on file.   Social History Main Topics  . Smoking status: Current Every Day Smoker -- 0.25 packs/day for 15 years    Types: Cigarettes  . Smokeless tobacco: Never Used  . Alcohol Use: 1.2 oz/week    2 Cans of beer per week     Comment: rarely   . Drug Use: Yes    Special: Marijuana  . Sexually Active: Yes    Birth Control/ Protection: Condom   Other Topics Concern  . Not on file   Social History Narrative  . No narrative on file    No family history on file.  ROS- All systems are reviewed and negative except as per the HPI above  Physical Exam: Filed Vitals:   10/07/12 1109  BP: 102/63  Pulse: 97  Weight: 136 lb 1.9 oz (61.744 kg)    GEN- The patient is thin but pleasant appearing, alert and oriented x 3 today.   Head- normocephalic, atraumatic Eyes-  Sclera clear, conjunctiva pink Ears- hearing intact Oropharynx- clear Neck- supple, no JVD Lungs- Clear to ausculation bilaterally, normal work of breathing Heart- Regular rate and rhythm, no murmurs, rubs or gallops, PMI not laterally displaced GI- soft, NT, ND, + BS Extremities- no  clubbing, cyanosis, or edema MS- thin with diffuse muscle atrophy Skin- no rash or lesion Psych- euthymic mood, full affect Neuro- strength and sensation are intact  EKGs/ event monitor and echo are reviewed  Assessment and Plan:  1. Sinus tachycardia- reactive and likely related to underlying chronic medical illness/ poor PO intake.  He has HIV, chronic anemia, and poor nutrition/ PO intake. Adequate intake and hydration are encouraged.  I have encouraged smoking cessation as smoking has been known to cause persistent tachycardia.  He will continue to receive treatment for his anemia/ HIV.  No further EP workup is advised.  He has clinically improved with low dose beta blocker.  This will be continued.  I  would not favor more aggressive medical management at this point.  He will continue to follow with Dr [name] and I will see as needed going forward.

## 2012-10-24 ENCOUNTER — Other Ambulatory Visit: Payer: Self-pay | Admitting: *Deleted

## 2012-10-24 DIAGNOSIS — A64 Unspecified sexually transmitted disease: Secondary | ICD-10-CM

## 2012-10-24 MED ORDER — VALACYCLOVIR HCL 1 G PO TABS: 1000.0000 mg | ORAL_TABLET | Freq: Two times a day (BID) | ORAL | Status: DC

## 2012-12-07 ENCOUNTER — Encounter (HOSPITAL_COMMUNITY): Payer: Self-pay | Admitting: *Deleted

## 2012-12-07 ENCOUNTER — Emergency Department (INDEPENDENT_AMBULATORY_CARE_PROVIDER_SITE_OTHER): Payer: Medicare Other

## 2012-12-07 ENCOUNTER — Emergency Department (INDEPENDENT_AMBULATORY_CARE_PROVIDER_SITE_OTHER)
Admission: EM | Admit: 2012-12-07 | Discharge: 2012-12-07 | Disposition: A | Discharge status: Home / Self Care | Payer: Medicare Other | Source: Home / Self Care | Attending: Family Medicine | Admitting: Family Medicine

## 2012-12-07 DIAGNOSIS — N201 Calculus of ureter: Secondary | ICD-10-CM | POA: Diagnosis not present

## 2012-12-07 DIAGNOSIS — N200 Calculus of kidney: Secondary | ICD-10-CM | POA: Diagnosis not present

## 2012-12-07 LAB — POCT URINALYSIS DIP (DEVICE)
Protein, ur: 30 mg/dL — AB
Specific Gravity, Urine: 1.02 (ref 1.005–1.030)
Urobilinogen, UA: 0.2 mg/dL (ref 0.0–1.0)

## 2012-12-07 MED ORDER — KETOROLAC TROMETHAMINE 10 MG PO TABS: 10.0000 mg | ORAL_TABLET | Freq: Four times a day (QID) | ORAL | Status: DC | PRN

## 2012-12-07 MED ORDER — TAMSULOSIN HCL 0.4 MG PO CAPS: 0.4000 mg | ORAL_CAPSULE | Freq: Every day | ORAL | Status: DC

## 2012-12-07 NOTE — ED Provider Notes (Addendum)
History     CSN: [id number]  Arrival date & time 12/07/12  1507   None     Chief Complaint  Patient presents with  . Abdominal Pain    (Consider location/radiation/quality/duration/timing/severity/associated sxs/prior treatment) Patient is a 32 y.o. male presenting with abdominal pain. The history is provided by the patient and a parent.  Abdominal Pain Pain location:  LLQ Pain quality: cramping and dull   Pain radiates to:  Does not radiate Pain severity:  Mild Onset quality:  Gradual Duration:  2 days Progression:  Improving Chronicity:  New Context comment:  No bm x 2 d until in [facility] lobby, sx improving. Relieved by:  Bowel activity Associated symptoms: constipation   Associated symptoms: no diarrhea, no nausea and no vomiting     Past Medical History  Diagnosis Date  . HIV disease 06/25/2012  . Skin lesion 07/03/2012  . Leg pain 07/03/2012  . Sinus tachycardia 08/06/2012  . Penile cyst 08/06/2012    History reviewed. No pertinent past surgical history.  History reviewed. No pertinent family history.  History  Substance Use Topics  . Smoking status: Current Every Day Smoker -- 0.25 packs/day for 15 years    Types: Cigarettes  . Smokeless tobacco: Never Used  . Alcohol Use: 1.2 oz/week    2 Cans of beer per week     Comment: rarely       Review of Systems  Constitutional: Negative.   Gastrointestinal: Positive for abdominal pain and constipation. Negative for nausea, vomiting and diarrhea.    Allergies  Review of patient's allergies indicates no known allergies.  Home Medications   Current Outpatient Rx  Name  Route  Sig  Dispense  Refill  . atenolol (TENORMIN) 25 MG tablet   Oral   Take 1 tablet (25 mg total) by mouth daily.   30 tablet   6   . efavirenz-emtricitabine-tenofovir (ATRIPLA) 600-200-300 MG per tablet   Oral   Take 1 tablet by mouth at bedtime.   30 tablet   6   . ENSURE (ENSURE)   Oral   Take 237 mLs by mouth 2 (two) times  daily between meals.   237 mL   11   . gabapentin (NEURONTIN) 300 MG capsule               . ketorolac (TORADOL) 10 MG tablet   Oral   Take 1 tablet (10 mg total) by mouth every 6 (six) hours as needed for pain.   20 tablet   0   . tamsulosin (FLOMAX) 0.4 MG CAPS   Oral   Take 1 capsule (0.4 mg total) by mouth daily.   30 capsule   0   . valACYclovir (VALTREX) 1000 MG tablet   Oral   Take 1 tablet (1,000 mg total) by mouth 2 (two) times daily.   14 tablet   2     BP 129/80  Pulse 109  Temp(Src) 98.4 F (36.9 C) (Oral)  Resp 18  SpO2 98%  Physical Exam  Nursing note and vitals reviewed. Constitutional: He is oriented to person, place, and time. He appears well-developed and well-nourished. No distress.  Cardiovascular: Normal rate, regular rhythm, normal heart sounds and intact distal pulses.   Pulmonary/Chest: Effort normal and breath sounds normal.  Abdominal: Soft. Bowel sounds are normal. He exhibits no distension and no mass. There is no tenderness. There is no rebound and no guarding.  Neurological: He is alert and oriented to person, place, and  time.  Skin: Skin is warm and dry.    ED Course  Procedures (including critical care time)  Labs Reviewed  POCT URINALYSIS DIP (DEVICE) - Abnormal; Notable for the following:    Hgb urine dipstick SMALL (*)    Protein, ur 30 (*)    All other components within normal limits   Dg Abd 1 View  12/07/2012   *RADIOLOGY REPORT*  Clinical Data: Abdominal pain  ABDOMEN - 1 VIEW  Comparison: None  Findings: Bowel gas pattern appears normal.  There are no abnormally dilated loops of small bowel or fluid levels. Bilateral renal stones are identified.  These measure up to 4 mm. No stone identified along the course of the ureters or within the urinary bladder.  IMPRESSION:  1.  Normal bowel gas pattern. 2.  Bilateral renal calculi.   Original Report Authenticated By: Signa Kell, M.D.     1. Ureteral stone       MDM   X-rays reviewed and report per radiologist.         Linna Hoff, MD 12/07/12 1758  Linna Hoff, MD 12/08/12 [phone]

## 2012-12-07 NOTE — ED Notes (Signed)
Pt  Reports  Low  abd   Pain  X  2  Days   denys  Any  Nausea      -  denys  Any  Vomiting       Had  bm  2  Days  -         No  Urinary  Symptoms  Or  Pain on  Urination

## 2012-12-26 NOTE — Progress Notes (Signed)
Patient ID: Thomas [name], male   DOB: [birth year], 32 y.o.   MRN: [id number] THP CM: Neila Gear

## 2013-02-06 ENCOUNTER — Other Ambulatory Visit: Payer: Medicare Other

## 2013-02-06 DIAGNOSIS — B20 Human immunodeficiency virus [HIV] disease: Secondary | ICD-10-CM

## 2013-02-06 LAB — CBC WITH DIFFERENTIAL/PLATELET
Basophils Relative: 1 % (ref 0–1)
Eosinophils Absolute: 0 10*3/uL (ref 0.0–0.7)
Eosinophils Relative: 0 % (ref 0–5)
HCT: 25.1 % — ABNORMAL LOW (ref 39.0–52.0)
Hemoglobin: 8.7 g/dL — ABNORMAL LOW (ref 13.0–17.0)
MCH: 28.5 pg (ref 26.0–34.0)
MCHC: 34.7 g/dL (ref 30.0–36.0)
MCV: 82.3 fL (ref 78.0–100.0)
Monocytes Absolute: 0.9 10*3/uL (ref 0.1–1.0)
Monocytes Relative: 17 % — ABNORMAL HIGH (ref 3–12)

## 2013-02-06 LAB — COMPLETE METABOLIC PANEL WITH GFR
Albumin: 2.8 g/dL — ABNORMAL LOW (ref 3.5–5.2)
Alkaline Phosphatase: 158 U/L — ABNORMAL HIGH (ref 39–117)
BUN: 16 mg/dL (ref 6–23)
GFR, Est Non African American: 80 mL/min
Glucose, Bld: 105 mg/dL — ABNORMAL HIGH (ref 70–99)
Potassium: 4.3 mEq/L (ref 3.5–5.3)
Total Bilirubin: 0.4 mg/dL (ref 0.3–1.2)

## 2013-02-07 LAB — HIV-1 RNA QUANT-NO REFLEX-BLD
HIV 1 RNA Quant: 31 copies/mL — ABNORMAL HIGH (ref <20)
HIV-1 RNA Quant, Log: 1.49 {Log} — ABNORMAL HIGH (ref <1.30)

## 2013-02-07 LAB — T-HELPER CELL (CD4) - (RCID CLINIC ONLY): CD4 T Cell Abs: 730 uL (ref 400–2700)

## 2013-02-20 ENCOUNTER — Ambulatory Visit: Payer: Medicare Other | Admitting: Internal Medicine

## 2013-02-20 ENCOUNTER — Telehealth: Payer: Self-pay | Admitting: *Deleted

## 2013-02-20 NOTE — Telephone Encounter (Signed)
Attempted to call patient to reschedule his appt, he no showed today and his phone is not working. He did however have his labs done 02/06/13. Thomas [name]

## 2013-03-14 ENCOUNTER — Other Ambulatory Visit: Payer: Self-pay | Admitting: Internal Medicine

## 2013-04-02 ENCOUNTER — Telehealth: Payer: Self-pay | Admitting: *Deleted

## 2013-04-02 NOTE — Telephone Encounter (Signed)
Invalid number. Can not confirm

## 2013-04-03 ENCOUNTER — Ambulatory Visit: Payer: Self-pay | Admitting: Nurse Practitioner

## 2013-04-15 ENCOUNTER — Other Ambulatory Visit: Payer: Self-pay | Admitting: Internal Medicine

## 2013-05-20 ENCOUNTER — Other Ambulatory Visit: Payer: Self-pay | Admitting: Internal Medicine

## 2013-06-03 ENCOUNTER — Telehealth: Payer: Self-pay | Admitting: *Deleted

## 2013-06-03 ENCOUNTER — Encounter: Payer: Self-pay | Admitting: *Deleted

## 2013-06-03 NOTE — Telephone Encounter (Signed)
Will send letter.

## 2013-06-05 ENCOUNTER — Other Ambulatory Visit: Payer: Medicare Other

## 2013-06-10 ENCOUNTER — Other Ambulatory Visit: Payer: Medicare Other

## 2013-06-11 ENCOUNTER — Other Ambulatory Visit: Payer: Self-pay | Admitting: Internal Medicine

## 2013-06-17 ENCOUNTER — Telehealth: Payer: Self-pay | Admitting: Internal Medicine

## 2013-06-17 ENCOUNTER — Other Ambulatory Visit: Payer: Self-pay | Admitting: Internal Medicine

## 2013-06-17 ENCOUNTER — Other Ambulatory Visit (INDEPENDENT_AMBULATORY_CARE_PROVIDER_SITE_OTHER): Payer: Medicare Other

## 2013-06-17 ENCOUNTER — Ambulatory Visit: Payer: Medicare Other | Admitting: Internal Medicine

## 2013-06-17 ENCOUNTER — Ambulatory Visit: Payer: Medicare Other

## 2013-06-17 DIAGNOSIS — Z23 Encounter for immunization: Secondary | ICD-10-CM

## 2013-06-17 DIAGNOSIS — Z79899 Other long term (current) drug therapy: Secondary | ICD-10-CM

## 2013-06-17 DIAGNOSIS — D649 Anemia, unspecified: Secondary | ICD-10-CM

## 2013-06-17 DIAGNOSIS — B20 Human immunodeficiency virus [HIV] disease: Secondary | ICD-10-CM

## 2013-06-17 LAB — CBC WITH DIFFERENTIAL/PLATELET
Basophils Absolute: 0 10*3/uL (ref 0.0–0.1)
HCT: 18.8 % — ABNORMAL LOW (ref 39.0–52.0)
Lymphocytes Relative: 25 % (ref 12–46)
Lymphs Abs: 1.1 10*3/uL (ref 0.7–4.0)
MCV: 83.2 fL (ref 78.0–100.0)
Neutro Abs: 2.6 10*3/uL (ref 1.7–7.7)
Platelets: 189 10*3/uL (ref 150–400)
RBC: 2.26 MIL/uL — ABNORMAL LOW (ref 4.22–5.81)
RDW: 19.2 % — ABNORMAL HIGH (ref 11.5–15.5)

## 2013-06-17 LAB — LIPID PANEL: Cholesterol: 88 mg/dL (ref 0–200)

## 2013-06-17 LAB — COMPREHENSIVE METABOLIC PANEL
ALT: 30 U/L (ref 0–53)
AST: 55 U/L — ABNORMAL HIGH (ref 0–37)
Albumin: 2.7 g/dL — ABNORMAL LOW (ref 3.5–5.2)
CO2: 20 mEq/L (ref 19–32)
Calcium: 8.2 mg/dL — ABNORMAL LOW (ref 8.4–10.5)
Chloride: 96 mEq/L (ref 96–112)
Potassium: 4.2 mEq/L (ref 3.5–5.3)
Total Protein: 7.5 g/dL (ref 6.0–8.3)

## 2013-06-17 NOTE — Telephone Encounter (Signed)
   Reason for call:   I received a call from [facility] at 9:21 AM indicating critical lab value for [name] with Hb 6.2. He appears to have had lab work done today @1658  per Dr. Luciana [name].    Pertinent Data:   Hb today 6.2  Last Hb 8.7 02/06/13  HIV--per Dr. Ephriam [name] office note in March 2014: unspecified anemia likely related to HIV with low retic and some BM suppression. Will continue to monitor and consider heme input it does not improve.   Hb has not been <8 per epic records prior to today   Assessment / Plan / Recommendations:   Thomas [name] is a 32 year old male with HIV and hx of sinus tachycardia and anemia with critical lab value of Hb 6.2 per lab's drawn earlier today.  Last Hb noted to be 8.7 in July 2014.   Discussed with Dr. Drue [name], on call ID attending who will get in touch with patient. Patient phone number and emergency contact given to Dr. Drue [name] over the phone.  Will route note to Dr. Luciana [name], Dr. Drue [name], and pcp as well   Darden Palmer, MD   06/17/2013, 9:36 PM

## 2013-06-18 ENCOUNTER — Telehealth: Payer: Self-pay | Admitting: *Deleted

## 2013-06-18 ENCOUNTER — Other Ambulatory Visit: Payer: Medicare Other

## 2013-06-18 ENCOUNTER — Other Ambulatory Visit: Payer: Self-pay | Admitting: *Deleted

## 2013-06-18 ENCOUNTER — Telehealth: Payer: Self-pay | Admitting: Internal Medicine

## 2013-06-18 DIAGNOSIS — Z23 Encounter for immunization: Secondary | ICD-10-CM | POA: Diagnosis not present

## 2013-06-18 DIAGNOSIS — D649 Anemia, unspecified: Secondary | ICD-10-CM

## 2013-06-18 DIAGNOSIS — N201 Calculus of ureter: Secondary | ICD-10-CM | POA: Diagnosis not present

## 2013-06-18 LAB — T-HELPER CELL (CD4) - (RCID CLINIC ONLY): CD4 % Helper T Cell: 36 % (ref 33–55)

## 2013-06-18 LAB — CBC
HCT: 21 % — ABNORMAL LOW (ref 39.0–52.0)
Hemoglobin: 6.8 g/dL — CL (ref 13.0–17.0)
RBC: 2.42 MIL/uL — ABNORMAL LOW (ref 4.22–5.81)
RDW: 19 % — ABNORMAL HIGH (ref 11.5–15.5)
WBC: 6.4 10*3/uL (ref 4.0–10.5)

## 2013-06-18 NOTE — Telephone Encounter (Signed)
Thanks for letting us know of the abn lab. We had him come in for stat labs on 11/26 for repeat and re-evaluation

## 2013-06-18 NOTE — Telephone Encounter (Signed)
Called patient and asked him to come in today for a repeat CBC.  Hgb from 11/25 = 6.2.  Pt denied being symptomatic. Pt will come in today for a repeat. [name], RN

## 2013-06-18 NOTE — Telephone Encounter (Signed)
Spoke to patient last night about abnormal CBC and he did not report any evidence of overt bleeding, or blood in stool. He had repeat cbc this morning at is still abn low with hgb of 6.8 and hct 21. He does report sob with exertion which could be related to anemia. Asked him to call in order to discuss being admitted to work up anemia.

## 2013-06-20 LAB — HIV-1 RNA QUANT-NO REFLEX-BLD: HIV 1 RNA Quant: 23 copies/mL — ABNORMAL HIGH (ref <20)

## 2013-06-23 ENCOUNTER — Telehealth: Payer: Self-pay | Admitting: *Deleted

## 2013-06-23 ENCOUNTER — Ambulatory Visit: Payer: Medicare Other | Admitting: Internal Medicine

## 2013-06-23 ENCOUNTER — Other Ambulatory Visit: Payer: Self-pay | Admitting: *Deleted

## 2013-06-23 DIAGNOSIS — B20 Human immunodeficiency virus [HIV] disease: Secondary | ICD-10-CM

## 2013-06-23 DIAGNOSIS — D649 Anemia, unspecified: Secondary | ICD-10-CM

## 2013-06-23 NOTE — Addendum Note (Signed)
Addended bySteva Colder on: 06/23/2013 04:15 PM   Modules accepted: Orders

## 2013-06-23 NOTE — Telephone Encounter (Signed)
Spoke with patient, he will come in to see Dr. [name] today at 11 for work-up of his anemia.  Gave patient 10:15 appointment slot, but he needs to coordinate a ride and will not be able to arrive any sooner than 11:00. [name], RN

## 2013-06-24 ENCOUNTER — Ambulatory Visit (INDEPENDENT_AMBULATORY_CARE_PROVIDER_SITE_OTHER): Payer: Medicare Other | Admitting: Internal Medicine

## 2013-06-24 ENCOUNTER — Telehealth: Payer: Self-pay | Admitting: *Deleted

## 2013-06-24 ENCOUNTER — Encounter: Payer: Self-pay | Admitting: Internal Medicine

## 2013-06-24 VITALS — BP 102/68 | HR 137 | Temp 98.2°F | Ht 72.0 in | Wt 121.0 lb

## 2013-06-24 DIAGNOSIS — R634 Abnormal weight loss: Secondary | ICD-10-CM | POA: Diagnosis not present

## 2013-06-24 DIAGNOSIS — R64 Cachexia: Secondary | ICD-10-CM | POA: Diagnosis not present

## 2013-06-24 DIAGNOSIS — I498 Other specified cardiac arrhythmias: Secondary | ICD-10-CM

## 2013-06-24 DIAGNOSIS — D638 Anemia in other chronic diseases classified elsewhere: Secondary | ICD-10-CM | POA: Diagnosis not present

## 2013-06-24 DIAGNOSIS — D649 Anemia, unspecified: Secondary | ICD-10-CM | POA: Diagnosis not present

## 2013-06-24 DIAGNOSIS — B20 Human immunodeficiency virus [HIV] disease: Secondary | ICD-10-CM

## 2013-06-24 DIAGNOSIS — R000 Tachycardia, unspecified: Secondary | ICD-10-CM

## 2013-06-24 DIAGNOSIS — Z21 Asymptomatic human immunodeficiency virus [HIV] infection status: Secondary | ICD-10-CM

## 2013-06-24 LAB — IRON AND TIBC
%SAT: 10 % — ABNORMAL LOW (ref 20–55)
Iron: 18 ug/dL — ABNORMAL LOW (ref 42–165)
UIBC: 161 ug/dL (ref 125–400)

## 2013-06-24 LAB — RETICULOCYTES: ABS Retic: 51.2 10*3/uL (ref 19.0–186.0)

## 2013-06-24 LAB — TSH: TSH: 2.143 u[IU]/mL (ref 0.350–4.500)

## 2013-06-24 MED ORDER — ENSURE PO LIQD: 237.0000 mL | Freq: Two times a day (BID) | ORAL | Status: DC

## 2013-06-24 MED ORDER — MEGESTROL ACETATE 400 MG/10ML PO SUSP: 400.0000 mg | Freq: Two times a day (BID) | ORAL | Status: DC

## 2013-06-24 MED ORDER — EFAVIRENZ-EMTRICITAB-TENOFOVIR 600-200-300 MG PO TABS: ORAL_TABLET | ORAL | Status: DC

## 2013-06-24 MED ORDER — FERROUS SULFATE 325 (65 FE) MG PO TABS: 325.0000 mg | ORAL_TABLET | Freq: Two times a day (BID) | ORAL | Status: DC

## 2013-06-24 NOTE — Assessment & Plan Note (Signed)
Be doing okay with his Atripla though has had a notable decrease in his CD4 count. I suspect this is due to his underlying illness is viral suppression has been good. I did actually check with the pharmacy and he has been getting his medication routinely filled at least since July though prior to July I did not have him getting medications at any other pharmacy. So he denies being off medications suspect he has had a period where he was off prior to July. His current numbers OR reassuring that he is currently on treatment.

## 2013-06-24 NOTE — Addendum Note (Signed)
Addended by: Gardiner Barefoot on: 06/24/2013 04:55 PM   Modules accepted: Orders

## 2013-06-24 NOTE — Assessment & Plan Note (Signed)
It is an unclear etiology. He does not seem to be hemolyzing with a normal bilirubin and his platelets and white cell count are essentially within the normal range so it seems to be isolated to his hemoglobin. It has been persistent and dropping since he initially presented. His previous reticulocyte count was significantly suppressed so maybe a bone marrow process though isolated to the hemoglobin.  He may have some underlying genetic issue however is not microcytic. I'm going to resend some of his labs and have him sent to hematology as soon as possible. If he has any further symptomatic issues such as shortness of breath I will consider having him sent to the hospital for further evaluation and transfusion however since this appears to be chronic issue with the decrease over time, there is no acute indication for hospitalization.

## 2013-06-24 NOTE — Assessment & Plan Note (Signed)
This may be part of his underlying illness. He previously had a normal TSH and I will recheck this again. I also will check a urine drug screen to assure no other drugs are involved

## 2013-06-24 NOTE — Progress Notes (Signed)
   Subjective:    Patient ID: Thomas [name], male    DOB: [birth year], 32 y.o.   MRN: [id number]  HPI He comes in for work in visit. He has a history of HIV and I started him on Atripla last year. He also had a notable normocytic anemia at that time. Additionally he was tachycardic. His anemia was significant for a low retic count and otherwise normal iron and ferritin.  He unfortunately fell out of care and has not been seen since March of this year. He then returned for labs last week and his hemoglobin was notably low at 6.2 and this was repeated and again was 6.8.  He has had some fatigue but no shortness of breath. He has had some weight loss. He says he's been taking his Atripla daily and his viral load is suppressed though his CD4 count is down to 370 which is much lower than previous. He denies any sleep issues, no lymphadenopathy. His weight is down 14 pounds from when he was last here in March. He has a poor appetite.   Review of Systems  Constitutional: Positive for unexpected weight change. Negative for fever and fatigue.  HENT: Negative for sore throat.   Eyes: Negative for visual disturbance.  Respiratory: Negative for shortness of breath.   Gastrointestinal: Negative for nausea, abdominal pain, diarrhea and blood in stool.  Endocrine: Negative for cold intolerance, heat intolerance, polydipsia and polyuria.  Musculoskeletal: Negative for joint swelling.  Skin: Positive for pallor. Negative for rash.  Neurological: Negative for dizziness, light-headedness and headaches.  Hematological: Negative for adenopathy.  Psychiatric/Behavioral: Positive for dysphoric mood. The patient is not nervous/anxious.        Objective:   Physical Exam  Constitutional: He is oriented to person, place, and time.  He is thin and pale appearing  HENT:  Mouth/Throat: No oropharyngeal exudate.  Pulmonary/Chest: Effort normal and breath sounds normal. No respiratory distress. He has no wheezes.    Lymphadenopathy:       Head (right side): No submandibular adenopathy present.       Head (left side): No submandibular adenopathy present.       Right cervical: No superficial cervical, no deep cervical and no posterior cervical adenopathy present.      Left cervical: No superficial cervical, no deep cervical and no posterior cervical adenopathy present.       Right: No supraclavicular adenopathy present.       Left: No supraclavicular adenopathy present.  Neurological: He is alert and oriented to person, place, and time.  Skin: Skin is warm and dry. No rash noted. There is pallor.  Psychiatric: He has a normal mood and affect.          Assessment & Plan:

## 2013-06-24 NOTE — Telephone Encounter (Signed)
Notified patient of referral to Hematology at [facility].  Gave patient appointment information, asked him to contact us if he had any questions before his appointment.  Also let him know he has a prescription for iron tablets ready for pick up at [facility]. [name], RN

## 2013-06-24 NOTE — Telephone Encounter (Signed)
Called patient about his missed appt and he came this morning to see Dr Luciana Axe.

## 2013-06-25 LAB — DRUG SCREEN, URINE
Amphetamine Screen, Ur: NEGATIVE
Cocaine Metabolites: NEGATIVE
Creatinine,U: 89.76 mg/dL
Opiates: NEGATIVE
Phencyclidine (PCP): NEGATIVE

## 2013-06-26 ENCOUNTER — Telehealth: Payer: Self-pay | Admitting: Oncology

## 2013-06-26 NOTE — Telephone Encounter (Signed)
C/D 06/26/13 for appt.07/02/13

## 2013-06-27 ENCOUNTER — Other Ambulatory Visit: Payer: Self-pay | Admitting: Oncology

## 2013-06-27 DIAGNOSIS — D539 Nutritional anemia, unspecified: Secondary | ICD-10-CM

## 2013-06-28 LAB — PARVOVIRUS B19 ANTIBODY, IGG AND IGM: Parovirus B19 IgM Abs: 0 index (ref <0.9)

## 2013-07-01 ENCOUNTER — Ambulatory Visit: Payer: Medicare Other | Admitting: Internal Medicine

## 2013-07-02 ENCOUNTER — Encounter: Payer: Self-pay | Admitting: Oncology

## 2013-07-02 ENCOUNTER — Telehealth: Payer: Self-pay | Admitting: Oncology

## 2013-07-02 ENCOUNTER — Ambulatory Visit (HOSPITAL_BASED_OUTPATIENT_CLINIC_OR_DEPARTMENT_OTHER): Payer: Medicare Other | Admitting: Oncology

## 2013-07-02 ENCOUNTER — Other Ambulatory Visit (HOSPITAL_BASED_OUTPATIENT_CLINIC_OR_DEPARTMENT_OTHER): Payer: Medicare Other

## 2013-07-02 ENCOUNTER — Ambulatory Visit: Payer: Medicare Other

## 2013-07-02 VITALS — BP 95/59 | HR 129 | Temp 98.2°F | Resp 20 | Ht 72.0 in | Wt 122.0 lb

## 2013-07-02 DIAGNOSIS — R634 Abnormal weight loss: Secondary | ICD-10-CM

## 2013-07-02 DIAGNOSIS — B20 Human immunodeficiency virus [HIV] disease: Secondary | ICD-10-CM

## 2013-07-02 DIAGNOSIS — D649 Anemia, unspecified: Secondary | ICD-10-CM | POA: Diagnosis not present

## 2013-07-02 DIAGNOSIS — L738 Other specified follicular disorders: Secondary | ICD-10-CM | POA: Diagnosis not present

## 2013-07-02 DIAGNOSIS — Z21 Asymptomatic human immunodeficiency virus [HIV] infection status: Secondary | ICD-10-CM | POA: Diagnosis not present

## 2013-07-02 DIAGNOSIS — D539 Nutritional anemia, unspecified: Secondary | ICD-10-CM | POA: Diagnosis not present

## 2013-07-02 DIAGNOSIS — R599 Enlarged lymph nodes, unspecified: Secondary | ICD-10-CM | POA: Diagnosis not present

## 2013-07-02 DIAGNOSIS — N201 Calculus of ureter: Secondary | ICD-10-CM | POA: Diagnosis not present

## 2013-07-02 DIAGNOSIS — D72819 Decreased white blood cell count, unspecified: Secondary | ICD-10-CM | POA: Diagnosis not present

## 2013-07-02 LAB — COMPREHENSIVE METABOLIC PANEL (CC13)
Albumin: 1.8 g/dL — ABNORMAL LOW (ref 3.5–5.0)
Anion Gap: 13 mEq/L — ABNORMAL HIGH (ref 3–11)
CO2: 24 mEq/L (ref 22–29)
Calcium: 9.5 mg/dL (ref 8.4–10.4)
Glucose: 89 mg/dl (ref 70–140)
Potassium: 3.7 mEq/L (ref 3.5–5.1)
Sodium: 132 mEq/L — ABNORMAL LOW (ref 136–145)
Total Protein: 8.1 g/dL (ref 6.4–8.3)

## 2013-07-02 LAB — CBC WITH DIFFERENTIAL/PLATELET
Basophils Absolute: 0 10*3/uL (ref 0.0–0.1)
Eosinophils Absolute: 0 10*3/uL (ref 0.0–0.5)
HCT: 22.8 % — ABNORMAL LOW (ref 38.4–49.9)
HGB: 6.9 g/dL — CL (ref 13.0–17.1)
MCV: 85.7 fL (ref 79.3–98.0)
NEUT#: 3 10*3/uL (ref 1.5–6.5)
RDW: 19.8 % — ABNORMAL HIGH (ref 11.0–14.6)
lymph#: 1.4 10*3/uL (ref 0.9–3.3)

## 2013-07-02 LAB — CHCC SMEAR

## 2013-07-02 NOTE — Progress Notes (Signed)
Please see consult note.  

## 2013-07-02 NOTE — Telephone Encounter (Signed)
gv and printed appt sched and avs forpt for March 2015  °

## 2013-07-02 NOTE — Progress Notes (Signed)
Checked in new patient with no financial issues and he has appt card.

## 2013-07-02 NOTE — Consult Note (Signed)
Reason for Referral: Anemia.   HPI: 32 year old gentleman currently of [place] where he lived the majority of his life referred to me for the evaluation of anemia. He is a gentleman that was diagnosed with HIV in October of 2013 after he had presented with weight loss and failure to thrive. He was started on a Atripla by Dr. Ronnie Derby who is following him for me to assist disease standpoint. He was lost to followup for few months but presented in November of 2014 with further weight loss and a hemoglobin of 6.2. His white cells were normal at 4.5 and platelet count of 189. His differential for his white cells were perfectly normal. A repeat CBC on 1126 showed a white cell count of 6.4 but a hemoglobin of 6.8. His reticulocyte count was normal and his iron studies showed an iron level of 18 which was low iron saturation of 10% but his ferritin was elevated at 4229. He was started on oral iron which she has not taken units but also was started on Megace and Ensure supplements. Previous hemoglobins have ranged previously between 9 and 10.  Clinically, he reports some mild fatigue and poor appetite but other labs he is really asymptomatic. He has not reported any chest pain shortness of breath or difficulty breathing. Has not reported any hemoptysis or hematemesis. Has not reported any hematochezia or melena. Has not reported any neurological symptoms such as headaches blurred vision double vision or seizures. He has not reported any nausea or vomiting or abdominal pain. Has not reported any constipation or diarrhea. Is not reporting any genitourinary bleeding or dysuria. Has not reported any musculoskeletal complaints. He is continued to perform most activities of daily living without any hindrance or decline.   Past Medical History  Diagnosis Date  . HIV disease 06/25/2012  . Skin lesion 07/03/2012  . Leg pain 07/03/2012  . Sinus tachycardia 08/06/2012  . Penile cyst 08/06/2012  :  No past surgical  history on file.:   Current Outpatient Prescriptions  Medication Sig Dispense Refill  . atenolol (TENORMIN) 25 MG tablet Take 1 tablet (25 mg total) by mouth daily.  30 tablet  6  . efavirenz-emtricitabine-tenofovir (ATRIPLA) 600-200-300 MG per tablet TAKE 1 TABLET BY MOUTH EVERY NIGHT AT BEDTIME  30 tablet  11  . ENSURE (ENSURE) Take 237 mLs by mouth 2 (two) times daily between meals.  237 mL  11  . ferrous sulfate 325 (65 FE) MG tablet Take 1 tablet (325 mg total) by mouth 2 (two) times daily with a meal.  60 tablet  3  . HYDROcodone-acetaminophen (NORCO/VICODIN) 5-325 MG per tablet       . megestrol (MEGACE) 400 MG/10ML suspension Take 10 mLs (400 mg total) by mouth 2 (two) times daily.  240 mL  0  . valACYclovir (VALTREX) 1000 MG tablet Take 1 tablet (1,000 mg total) by mouth 2 (two) times daily.  14 tablet  2   No current facility-administered medications for this visit.      No Known Allergies:  Family History: History of any hemoglobinopathy or blood disorders.   History   Social History  . Marital Status: Single    Spouse Name: N/A    Number of Children: N/A  . Years of Education: N/A   Occupational History  . Not on file.   Social History Main Topics  . Smoking status: Current Every Day Smoker -- 1.00 packs/day for 15 years    Types: Cigarettes  . Smokeless tobacco: Never  Used     Comment: quitline info, counseling  . Alcohol Use: 0.0 oz/week     Comment: rarely a glass of wine  . Drug Use: Yes    Special: Marijuana  . Sexual Activity: Not Currently    Partners: Female    Birth Control/ Protection: Condom     Comment: declined condoms   Other Topics Concern  . Not on file   Social History Narrative  . No narrative on file  :  Pertinent items are noted in HPI.  Exam: Blood pressure 95/59, pulse 129, temperature 98.2 F (36.8 C), temperature source Oral, resp. rate 20, height 6' (1.829 m), weight 122 lb (55.339 kg). General appearance: alert and  cooperative Head: Normocephalic, without obvious abnormality, atraumatic Nose: Nares normal. Septum midline. Mucosa normal. No drainage or sinus tenderness. Throat: lips, mucosa, and tongue normal; teeth and gums normal Neck: no adenopathy, no carotid bruit, no JVD, supple, symmetrical, trachea midline and thyroid not enlarged, symmetric, no tenderness/mass/nodules Back: negative Resp: clear to auscultation bilaterally Chest wall: no tenderness Cardio: regular rate and rhythm, S1, S2 normal, no murmur, click, rub or gallop GI: soft, non-tender; bowel sounds normal; no masses,  no organomegaly Extremities: extremities normal, atraumatic, no cyanosis or edema Pulses: 2+ and symmetric Skin: Skin color, texture, turgor normal. No rashes or lesions Lymph nodes: Cervical, supraclavicular, and axillary nodes normal. Neurologic: Grossly normal   Recent Labs  07/02/13 1348  WBC 5.2  HGB 6.9*  HCT 22.8*  PLT 209      Blood smear review: The peripheral smear was personally reviewed today and showed clearcut microcytosis and hypochromia. The white cells appeared normal. I could not appreciate any red cell fragmentation or spherocytosis.    Assessment and Plan:   32 year old gentleman with the following issues:  1. Normocytic normochromic anemia with an evidence of iron deficiency component. His MCV might be normal but his peripheral smear strongly suggest iron deficiency with microcytosis and hypochromia. His MCV might be normal due to the mixed population of red cells that could be macrocytic due to his HIV medication. Other causes of anemia to be considered would be hemolysis but less likely at this time. Anemia of chronic disease is always a possibility with history of HIV but I doubt that he has a malignant disorder such as leukemia or lymphoma. Anemia it due to his HIV medication Atripla has not been routinely documented. For the management standpoint, I agree with oral iron supplements  given the clear-cut component of his iron deficiency I have offered him IV iron supplementation and he prefer to proceed with oral iron for the time being. I've is reasonable to give this a try and recheck his counts in about 2-3 months. If his hemoglobin have corrected that no further intervention is needed. If he continued to be deficient and we will consider IV iron. If his iron stores are completely replete and he continued to be anemic then we will consider further workup.  2. Weight loss: This is likely related to his HIV rather than an occult malignancy. He is currently on Megace as well as Ensure supplement.

## 2013-07-04 LAB — HAPTOGLOBIN: Haptoglobin: 390 mg/dL — ABNORMAL HIGH (ref 45–215)

## 2013-07-04 LAB — DIRECT ANTIGLOBULIN TEST (NOT AT ARMC): DAT (Complement): NEGATIVE

## 2013-07-08 ENCOUNTER — Ambulatory Visit (INDEPENDENT_AMBULATORY_CARE_PROVIDER_SITE_OTHER): Payer: Medicare Other | Admitting: Internal Medicine

## 2013-07-08 ENCOUNTER — Encounter: Payer: Self-pay | Admitting: Internal Medicine

## 2013-07-08 VITALS — BP 99/66 | Temp 98.5°F | Ht 72.0 in | Wt 120.0 lb

## 2013-07-08 DIAGNOSIS — B20 Human immunodeficiency virus [HIV] disease: Secondary | ICD-10-CM

## 2013-07-08 DIAGNOSIS — L738 Other specified follicular disorders: Secondary | ICD-10-CM

## 2013-07-08 DIAGNOSIS — D649 Anemia, unspecified: Secondary | ICD-10-CM | POA: Diagnosis not present

## 2013-07-08 DIAGNOSIS — N201 Calculus of ureter: Secondary | ICD-10-CM | POA: Diagnosis not present

## 2013-07-08 DIAGNOSIS — L853 Xerosis cutis: Secondary | ICD-10-CM

## 2013-07-08 DIAGNOSIS — R634 Abnormal weight loss: Secondary | ICD-10-CM | POA: Diagnosis not present

## 2013-07-08 DIAGNOSIS — Z21 Asymptomatic human immunodeficiency virus [HIV] infection status: Secondary | ICD-10-CM | POA: Diagnosis not present

## 2013-07-08 MED ORDER — EFAVIRENZ-EMTRICITAB-TENOFOVIR 600-200-300 MG PO TABS: ORAL_TABLET | ORAL | Status: DC

## 2013-07-08 MED ORDER — ATENOLOL 25 MG PO TABS: 25.0000 mg | ORAL_TABLET | Freq: Every day | ORAL | Status: DC

## 2013-07-08 NOTE — Progress Notes (Signed)
   Subjective:    Patient ID: Thomas [name], male    DOB: Feb 01, 1981, 32 y.o.   MRN: [id number]  HPI He comes in for followup of his anemia and HIV. He has since started Ensure and does take his iron pills. He also has been seen by hematology who agrees with iron deficiency anemia and the biggest issue. Certainly HIV may be a big part of this. He has not been taking his atenolol. He has a moderate weight gain and is eating well along with the Ensure. He he has had some problems with dry skin.   Review of Systems  Constitutional: Negative for unexpected weight change.       But still markedly underweight  Eyes: Negative for visual disturbance.  Respiratory: Negative for cough and shortness of breath.   Cardiovascular: Negative for chest pain and palpitations.  Gastrointestinal: Negative for nausea and diarrhea.  Neurological: Negative for dizziness and light-headedness.  Psychiatric/Behavioral: Negative for dysphoric mood.       Objective:   Physical Exam  Constitutional: He is oriented to person, place, and time.  Very thin appearing  HENT:  Mouth/Throat: No oropharyngeal exudate.  Eyes: Right eye exhibits no discharge. Left eye exhibits no discharge. No scleral icterus.  Cardiovascular: Regular rhythm and normal heart sounds.   No murmur heard. Pulmonary/Chest: Effort normal and breath sounds normal. No respiratory distress. He has no wheezes.  Lymphadenopathy:    He has no cervical adenopathy.  Neurological: He is alert and oriented to person, place, and time.  Skin: Skin is warm and dry. No rash noted.  Dry skin throughout  Psychiatric: He has a normal mood and affect.          Assessment & Plan:

## 2013-07-08 NOTE — Assessment & Plan Note (Signed)
He is doing okay with his medicine will continue. We'll check his labs in 3-4 weeks the

## 2013-07-08 NOTE — Assessment & Plan Note (Signed)
I appreciate input from hematology. He will continue with iron and I will check him in about 4 weeks and if he has good improvement with his hemoglobin he will continue. If there is not much improvement I will consider sending him back for IV iron. He will continue to take iron supplementation in iron rich foods.

## 2013-07-08 NOTE — Assessment & Plan Note (Signed)
I recommended Eucerin lotion.

## 2013-08-02 ENCOUNTER — Emergency Department (HOSPITAL_COMMUNITY): Payer: Medicare Other

## 2013-08-02 ENCOUNTER — Inpatient Hospital Stay (HOSPITAL_COMMUNITY)
Admission: EM | Admit: 2013-08-02 | Discharge: 2013-08-11 | DRG: 977 | Disposition: A | Discharge status: Home / Self Care | Payer: Medicare Other | Attending: Internal Medicine | Admitting: Internal Medicine

## 2013-08-02 ENCOUNTER — Encounter (HOSPITAL_COMMUNITY): Payer: Self-pay | Admitting: Emergency Medicine

## 2013-08-02 DIAGNOSIS — R4182 Altered mental status, unspecified: Secondary | ICD-10-CM | POA: Diagnosis present

## 2013-08-02 DIAGNOSIS — B20 Human immunodeficiency virus [HIV] disease: Secondary | ICD-10-CM | POA: Diagnosis not present

## 2013-08-02 DIAGNOSIS — I959 Hypotension, unspecified: Secondary | ICD-10-CM | POA: Diagnosis not present

## 2013-08-02 DIAGNOSIS — D649 Anemia, unspecified: Secondary | ICD-10-CM | POA: Diagnosis not present

## 2013-08-02 DIAGNOSIS — R591 Generalized enlarged lymph nodes: Secondary | ICD-10-CM

## 2013-08-02 DIAGNOSIS — D509 Iron deficiency anemia, unspecified: Secondary | ICD-10-CM | POA: Diagnosis not present

## 2013-08-02 DIAGNOSIS — Z21 Asymptomatic human immunodeficiency virus [HIV] infection status: Secondary | ICD-10-CM | POA: Diagnosis not present

## 2013-08-02 DIAGNOSIS — R51 Headache: Secondary | ICD-10-CM | POA: Diagnosis not present

## 2013-08-02 DIAGNOSIS — F17200 Nicotine dependence, unspecified, uncomplicated: Secondary | ICD-10-CM | POA: Diagnosis present

## 2013-08-02 DIAGNOSIS — R000 Tachycardia, unspecified: Secondary | ICD-10-CM | POA: Diagnosis present

## 2013-08-02 DIAGNOSIS — R64 Cachexia: Secondary | ICD-10-CM | POA: Diagnosis present

## 2013-08-02 DIAGNOSIS — Z833 Family history of diabetes mellitus: Secondary | ICD-10-CM

## 2013-08-02 DIAGNOSIS — Z79899 Other long term (current) drug therapy: Secondary | ICD-10-CM | POA: Diagnosis not present

## 2013-08-02 DIAGNOSIS — C8119 Nodular sclerosis classical Hodgkin lymphoma, extranodal and solid organ sites: Secondary | ICD-10-CM | POA: Diagnosis not present

## 2013-08-02 DIAGNOSIS — D72819 Decreased white blood cell count, unspecified: Secondary | ICD-10-CM | POA: Diagnosis not present

## 2013-08-02 DIAGNOSIS — Z681 Body mass index (BMI) 19 or less, adult: Secondary | ICD-10-CM | POA: Diagnosis not present

## 2013-08-02 DIAGNOSIS — L853 Xerosis cutis: Secondary | ICD-10-CM

## 2013-08-02 DIAGNOSIS — K5900 Constipation, unspecified: Secondary | ICD-10-CM | POA: Diagnosis not present

## 2013-08-02 DIAGNOSIS — R079 Chest pain, unspecified: Secondary | ICD-10-CM | POA: Diagnosis not present

## 2013-08-02 DIAGNOSIS — C8589 Other specified types of non-Hodgkin lymphoma, extranodal and solid organ sites: Secondary | ICD-10-CM | POA: Diagnosis not present

## 2013-08-02 DIAGNOSIS — R6889 Other general symptoms and signs: Secondary | ICD-10-CM | POA: Diagnosis not present

## 2013-08-02 DIAGNOSIS — R197 Diarrhea, unspecified: Secondary | ICD-10-CM | POA: Diagnosis not present

## 2013-08-02 DIAGNOSIS — N200 Calculus of kidney: Secondary | ICD-10-CM | POA: Diagnosis not present

## 2013-08-02 DIAGNOSIS — R680 Hypothermia, not associated with low environmental temperature: Secondary | ICD-10-CM | POA: Diagnosis not present

## 2013-08-02 DIAGNOSIS — T68XXXA Hypothermia, initial encounter: Secondary | ICD-10-CM | POA: Diagnosis not present

## 2013-08-02 DIAGNOSIS — L989 Disorder of the skin and subcutaneous tissue, unspecified: Secondary | ICD-10-CM

## 2013-08-02 DIAGNOSIS — R319 Hematuria, unspecified: Secondary | ICD-10-CM | POA: Diagnosis not present

## 2013-08-02 DIAGNOSIS — E861 Hypovolemia: Secondary | ICD-10-CM | POA: Diagnosis present

## 2013-08-02 DIAGNOSIS — Z886 Allergy status to analgesic agent status: Secondary | ICD-10-CM

## 2013-08-02 DIAGNOSIS — R627 Adult failure to thrive: Secondary | ICD-10-CM | POA: Diagnosis present

## 2013-08-02 DIAGNOSIS — L738 Other specified follicular disorders: Secondary | ICD-10-CM | POA: Diagnosis not present

## 2013-08-02 DIAGNOSIS — R599 Enlarged lymph nodes, unspecified: Secondary | ICD-10-CM | POA: Diagnosis present

## 2013-08-02 DIAGNOSIS — M79606 Pain in leg, unspecified: Secondary | ICD-10-CM

## 2013-08-02 DIAGNOSIS — D696 Thrombocytopenia, unspecified: Secondary | ICD-10-CM | POA: Diagnosis not present

## 2013-08-02 DIAGNOSIS — R42 Dizziness and giddiness: Secondary | ICD-10-CM | POA: Diagnosis not present

## 2013-08-02 DIAGNOSIS — E43 Unspecified severe protein-calorie malnutrition: Secondary | ICD-10-CM | POA: Diagnosis not present

## 2013-08-02 DIAGNOSIS — R0602 Shortness of breath: Secondary | ICD-10-CM | POA: Diagnosis not present

## 2013-08-02 DIAGNOSIS — N4889 Other specified disorders of penis: Secondary | ICD-10-CM

## 2013-08-02 LAB — CBC WITH DIFFERENTIAL/PLATELET
BASOS ABS: 0 10*3/uL (ref 0.0–0.1)
Basophils Relative: 1 % (ref 0–1)
Eosinophils Absolute: 0 10*3/uL (ref 0.0–0.7)
Eosinophils Relative: 0 % (ref 0–5)
HEMATOCRIT: 19.3 % — AB (ref 39.0–52.0)
Hemoglobin: 5.6 g/dL — CL (ref 13.0–17.0)
Lymphocytes Relative: 53 % — ABNORMAL HIGH (ref 12–46)
Lymphs Abs: 2.5 10*3/uL (ref 0.7–4.0)
MCH: 28.9 pg (ref 26.0–34.0)
MCHC: 29 g/dL — ABNORMAL LOW (ref 30.0–36.0)
MCV: 99.5 fL (ref 78.0–100.0)
MONOS PCT: 19 % — AB (ref 3–12)
Monocytes Absolute: 0.9 10*3/uL (ref 0.1–1.0)
NEUTROS PCT: 27 % — AB (ref 43–77)
Neutro Abs: 1.2 10*3/uL — ABNORMAL LOW (ref 1.7–7.7)
PLATELETS: 103 10*3/uL — AB (ref 150–400)
RBC: 1.94 MIL/uL — ABNORMAL LOW (ref 4.22–5.81)
RDW: 26.1 % — AB (ref 11.5–15.5)
WBC: 4.6 10*3/uL (ref 4.0–10.5)

## 2013-08-02 LAB — URINALYSIS, ROUTINE W REFLEX MICROSCOPIC
Bilirubin Urine: NEGATIVE
GLUCOSE, UA: NEGATIVE mg/dL
KETONES UR: NEGATIVE mg/dL
Nitrite: NEGATIVE
PH: 7 (ref 5.0–8.0)
Protein, ur: 30 mg/dL — AB
SPECIFIC GRAVITY, URINE: 1.015 (ref 1.005–1.030)
Urobilinogen, UA: 0.2 mg/dL (ref 0.0–1.0)

## 2013-08-02 LAB — COMPREHENSIVE METABOLIC PANEL
ALBUMIN: 1.8 g/dL — AB (ref 3.5–5.2)
ALK PHOS: 123 U/L — AB (ref 39–117)
ALT: 7 U/L (ref 0–53)
AST: 17 U/L (ref 0–37)
BUN: 18 mg/dL (ref 6–23)
CHLORIDE: 109 meq/L (ref 96–112)
CO2: 23 mEq/L (ref 19–32)
Calcium: 8.2 mg/dL — ABNORMAL LOW (ref 8.4–10.5)
Creatinine, Ser: 0.62 mg/dL (ref 0.50–1.35)
GFR calc Af Amer: >90 mL/min (ref >90)
GFR calc non Af Amer: >90 mL/min (ref >90)
Glucose, Bld: 100 mg/dL — ABNORMAL HIGH (ref 70–99)
POTASSIUM: 3.8 meq/L (ref 3.7–5.3)
SODIUM: 143 meq/L (ref 137–147)
TOTAL PROTEIN: 6.8 g/dL (ref 6.0–8.3)
Total Bilirubin: 0.3 mg/dL (ref 0.3–1.2)

## 2013-08-02 LAB — URINE MICROSCOPIC-ADD ON

## 2013-08-02 LAB — PREPARE RBC (CROSSMATCH)

## 2013-08-02 LAB — ABO/RH: ABO/RH(D): O POS

## 2013-08-02 LAB — LIPASE, BLOOD: Lipase: 19 U/L (ref 11–59)

## 2013-08-02 MED ORDER — PANTOPRAZOLE SODIUM 40 MG IV SOLR
40.0000 mg | Freq: Two times a day (BID) | INTRAVENOUS | Status: DC
  Administered 2013-08-02 – 2013-08-04 (×5): 40 mg via INTRAVENOUS
  Filled 2013-08-02 (×8): qty 40

## 2013-08-02 MED ORDER — EFAVIRENZ-EMTRICITAB-TENOFOVIR 600-200-300 MG PO TABS
1.0000 | ORAL_TABLET | Freq: Every day | ORAL | Status: DC
  Administered 2013-08-02 – 2013-08-10 (×9): 1 via ORAL
  Filled 2013-08-02 (×10): qty 1

## 2013-08-02 MED ORDER — ZOLPIDEM TARTRATE 5 MG PO TABS: 5.0000 mg | ORAL_TABLET | Freq: Every evening | ORAL | Status: DC | PRN

## 2013-08-02 MED ORDER — ONDANSETRON HCL 4 MG PO TABS: 4.0000 mg | ORAL_TABLET | Freq: Four times a day (QID) | ORAL | Status: DC | PRN

## 2013-08-02 MED ORDER — MORPHINE SULFATE 2 MG/ML IJ SOLN: 1.0000 mg | INTRAMUSCULAR | Status: DC | PRN

## 2013-08-02 MED ORDER — ONDANSETRON HCL 4 MG/2ML IJ SOLN: 4.0000 mg | Freq: Four times a day (QID) | INTRAMUSCULAR | Status: DC | PRN

## 2013-08-02 MED ORDER — ACETAMINOPHEN 325 MG PO TABS: 650.0000 mg | ORAL_TABLET | Freq: Four times a day (QID) | ORAL | Status: DC | PRN

## 2013-08-02 MED ORDER — HYDROCODONE-ACETAMINOPHEN 5-325 MG PO TABS
1.0000 | ORAL_TABLET | ORAL | Status: DC | PRN
  Administered 2013-08-08 (×2): 2 via ORAL
  Administered 2013-08-10 – 2013-08-11 (×2): 1 via ORAL
  Filled 2013-08-02: qty 2
  Filled 2013-08-02 (×2): qty 1

## 2013-08-02 MED ORDER — SODIUM CHLORIDE 0.9 % IV BOLUS (SEPSIS)
500.0000 mL | Freq: Once | INTRAVENOUS | Status: AC
  Administered 2013-08-02: 500 mL via INTRAVENOUS

## 2013-08-02 MED ORDER — SODIUM CHLORIDE 0.9 % IV SOLN
INTRAVENOUS | Status: DC
  Administered 2013-08-02: 19:00:00 via INTRAVENOUS

## 2013-08-02 MED ORDER — ACETAMINOPHEN 650 MG RE SUPP: 650.0000 mg | Freq: Four times a day (QID) | RECTAL | Status: DC | PRN

## 2013-08-02 MED ORDER — FERROUS SULFATE 325 (65 FE) MG PO TABS
325.0000 mg | ORAL_TABLET | Freq: Two times a day (BID) | ORAL | Status: DC
  Administered 2013-08-03 – 2013-08-11 (×15): 325 mg via ORAL
  Filled 2013-08-02 (×25): qty 1

## 2013-08-02 MED ORDER — ATENOLOL 25 MG PO TABS
25.0000 mg | ORAL_TABLET | Freq: Every day | ORAL | Status: DC
  Filled 2013-08-02: qty 1

## 2013-08-02 NOTE — ED Notes (Signed)
PA made aware of pts today weight.

## 2013-08-02 NOTE — ED Provider Notes (Signed)
CSN: [id number]     Arrival date & time 08/02/13  1544 History   First MD Initiated Contact with Patient 08/02/13 1548     Chief Complaint  Patient presents with  . Weight Loss    onset 2 weeks unknown weight loss, decreased appetite  . Fatigue    x 1 1/2 month   . Abdominal Pain    onset several days suprapubic pain  . Diarrhea    onset 2 weeks diarreha x 1-2 per day    (Consider location/radiation/quality/duration/timing/severity/associated sxs/prior Treatment) HPI  This is a 33 year old male with a past medical history of HIV who presents the emergency department  after episode this morning of shaking chills, severe fatigue and altered mentation.  He should states that he will this morning sweating and shaking.  His parents state that when they arrived he was sitting in a chair crying.  He was acting as if he was unaware appear he called EMS and had him transported to the emergency department.  The patient had a significant weight loss in a period of a few months.  He was previously taking Megace but stopped using it due to it causing him nausea.  The patient states that he has been taking his anti-retroviral medications as directed.  Review of his records shows a significant drop in his CD4 counts from the 700s down into the 300s last month.  Patient also had a significant drop in his hemoglobin.  He should states that he is extremely fatigued, weak, he has frequent black runny stools.  The patient is currently taking iron.  He also complains of some intermittent hematuria and lower quadrant abdominal pain.  The patient denies chest pain, cough, shortness of breath, headaches, rashes, fever.    Past Medical History  Diagnosis Date  . HIV disease 06/25/2012  . Skin lesion 07/03/2012  . Leg pain 07/03/2012  . Sinus tachycardia 08/06/2012  . Penile cyst 08/06/2012   History reviewed. No pertinent past surgical history. History reviewed. No pertinent family history. History  Substance  Use Topics  . Smoking status: Current Every Day Smoker -- 1.00 packs/day for 15 years    Types: Cigarettes  . Smokeless tobacco: Never Used     Comment: quitline info, counseling  . Alcohol Use: No    Review of Systems Ten systems reviewed and are negative for acute change, except as noted in the HPI.    Allergies  Review of patient's allergies indicates no known allergies.  Home Medications   Current Outpatient Rx  Name  Route  Sig  Dispense  Refill  . atenolol (TENORMIN) 25 MG tablet   Oral   Take 1 tablet (25 mg total) by mouth daily.   30 tablet   11   . efavirenz-emtricitabine-tenofovir (ATRIPLA) 600-200-300 MG per tablet      TAKE 1 TABLET BY MOUTH EVERY NIGHT AT BEDTIME   30 tablet   11   . ENSURE (ENSURE)   Oral   Take 237 mLs by mouth 2 (two) times daily between meals.   237 mL   11   . ferrous sulfate 325 (65 FE) MG tablet   Oral   Take 1 tablet (325 mg total) by mouth 2 (two) times daily with a meal.   60 tablet   3   . megestrol (MEGACE) 400 MG/10ML suspension   Oral   Take 10 mLs (400 mg total) by mouth 2 (two) times daily.   240 mL   0   .  valACYclovir (VALTREX) 1000 MG tablet   Oral   Take 1 tablet (1,000 mg total) by mouth 2 (two) times daily.   14 tablet   2    BP 100/61  Pulse 63  Temp(Src) 97.5 F (36.4 C) (Axillary)  Resp 18  SpO2 96% Physical Exam  Constitutional: He appears cachectic. He has a sickly appearance.  HENT:  Head: Normocephalic and atraumatic.  Eyes: EOM are normal. Pupils are equal, round, and reactive to light.  Pale oral mucosa and conjunctiva  Neck: Normal range of motion. No rigidity. No Brudzinski's sign and no Kernig's sign noted.  Cardiovascular: Regular rhythm.  Bradycardia present.   Pulmonary/Chest: Effort normal and breath sounds normal.  Abdominal: There is tenderness in the right lower quadrant and left lower quadrant. Hernia confirmed negative in the right inguinal area and confirmed negative in the  left inguinal area.    Genitourinary: Testes normal and penis normal. Right testis shows no tenderness. Left testis shows no tenderness. Circumcised.  Lymphadenopathy:    He has cervical adenopathy.       Right cervical: Posterior cervical adenopathy present.       Left cervical: Posterior cervical adenopathy present.       Right: Supraclavicular adenopathy present. No inguinal adenopathy present.       Left: Supraclavicular adenopathy present. No inguinal adenopathy present.  Neurological: He is alert. GCS eye subscore is 4. GCS verbal subscore is 5. GCS motor subscore is 6.  Skin: Skin is warm, dry and intact. No rash noted.  Redundant skin  Psychiatric: He is slowed.    ED Course  Procedures (including critical care time) Labs Review Labs Reviewed - No data to display Imaging Review No results found.  EKG Interpretation   None      CRITICAL CARE Performed by: Margarita Mail   Total critical care time: 35  Critical care time was exclusive of separately billable procedures and treating other patients.  Critical care was necessary to treat or prevent imminent or life-threatening deterioration.  Critical care was time spent personally by me on the following activities: development of treatment plan with patient and/or surrogate as well as nursing, discussions with consultants, evaluation of patient's response to treatment, examination of patient, obtaining history from patient or surrogate, ordering and performing treatments and interventions, ordering and review of laboratory studies, ordering and review of radiographic studies, pulse oximetry and re-evaluation of patient's condition.  MDM   1. Anemia   2. HIV (human immunodeficiency virus infection)   3. Cachexia   4. Lymphadenopathy    5:56 PM BP 92/64  Pulse 44  Temp(Src) 97.5 F (36.4 C) (Axillary)  Resp 11  Wt 105 lb (47.628 kg)  SpO2 95%  Patient with sig. Drop in his hgb, I have seen the patient is  shared visit with Dr. Curly Rim.  Patient concern for possible lymphoma versus other infectious etiology of his posterior chain supraclavicular lymphadenopathy.  The patient is cachectic with significant weight loss.  He also appears to have a significant drop in his hemoglobin.  I spoke with Dr. Johnnye Sima from infectious disease.  Patient will be given blood transfusion of 2 units starting here in the emergency department.  CT head and a chest x-ray showed no acute abnormality. I've spoken with Dr. Laren Everts will admit the patient for his symptomatic anemia.  Stool culture c-diff, cmv  are pending at this time.    Margarita Mail, PA-C 08/02/13 [phone]

## 2013-08-02 NOTE — ED Notes (Addendum)
Report given to Diane advised stool culture anad Cdiff by PCR and  CMV culture still needed to be collected.

## 2013-08-02 NOTE — ED Notes (Signed)
hgb 5.6 called by lab. PA made aware.

## 2013-08-02 NOTE — ED Notes (Signed)
Patient transported to X-ray 

## 2013-08-02 NOTE — Progress Notes (Signed)
Thomas [name] [id number] Admission Data: 08/02/2013 7:56 PM Attending Provider: Merton Border, MD  MHD:[id number], MD Consults/ Treatment Team:    Thomas [name] is a 33 y.o. male patient admitted from ED awake, alert  & orientated  X 3,  Full Code, VSS - Blood pressure 79/54, pulse 62, temperature 97.8 F (36.6 C), temperature source Oral, resp. rate 16, height 6\' 1"  (1.854 m), weight 48.3 kg (106 lb 7.7 oz), SpO2 100.00%.,  no c/o shortness of breath, no c/o chest pain, no distress noted.    IV site WDL:  forearm right, L AC condition patent and no redness with a transparent dsg that's clean dry and intact.  Allergies:   Allergies  Allergen Reactions  . Tylenol [Acetaminophen]     sweats     Past Medical History  Diagnosis Date  . HIV disease 06/25/2012  . Skin lesion 07/03/2012  . Leg pain 07/03/2012  . Sinus tachycardia 08/06/2012  . Penile cyst 08/06/2012    History:  obtained from the patient.   Pt orientation to unit, room and routine. Information packet given to patient/family and safety video watched.  Admission INP armband ID verified with patient/family, and in place. SR up x 2, fall risk assessment complete with Patient and family verbalizing understanding of risks associated with falls. Pt verbalizes an understanding of how to use the call bell and to call for help before getting out of bed.  Skin, clean-dry- intact without evidence of bruising, or skin tears.   No evidence of skin break down noted on exam.     Will cont to monitor and assist as needed.  Park Breed, RN 08/02/2013 7:56 PM

## 2013-08-02 NOTE — ED Notes (Signed)
To room via EMS.  Onset 2 weeks unknown weight loss, appetite, diarrhea x 1-2 per day.  Onset several days intermittant suprapubic pain.  No abd pain at this time.  Onset 1 1/2 months increased fatigue.  EMS BP 98/42 HR 48.  BS 113.  Is med compliant with HIV meds.  MD put pt back on Atenolol 3 weeks ago.

## 2013-08-02 NOTE — ED Provider Notes (Signed)
Medical screening examination/treatment/procedure(s) were conducted as a shared visit with non-physician practitioner(s) and myself.  I personally evaluated the patient during the encounter.  EKG Interpretation    Date/Time:    Ventricular Rate:    PR Interval:    QRS Duration:   QT Interval:    QTC Calculation:   R Axis:     Text Interpretation:              33 yo male with history of HIV presents emergency department with weakness, significant weight loss, lymphadenopathy, and significant anemia. Plan for transfusion and admission to internal medicine. Infectious disease was consulted by Ms. Harris.   Elmer Sow, MD 08/02/13 2103

## 2013-08-02 NOTE — H&P (Signed)
[facility]                                                                                    Patient Demographics  Thomas [name], is a 33 y.o. male  CSN: [id number]  MRN: [id number]  DOB - Jul 07, 1981  Admit Date - 08/02/2013  Outpatient Primary MD for the patient is Philis Fendt, MD   With History of -  Past Medical History  Diagnosis Date  . HIV disease 06/25/2012  . Skin lesion 07/03/2012  . Leg pain 07/03/2012  . Sinus tachycardia 08/06/2012  . Penile cyst 08/06/2012      History reviewed. No pertinent past surgical history.  in for   Chief Complaint  Patient presents with  . Weight Loss    onset 2 weeks unknown weight loss, decreased appetite  . Fatigue    x 1 1/2 month   . Abdominal Pain    onset several days suprapubic pain  . Diarrhea    onset 2 weeks diarreha x 1-2 per day      HPI  Thomas [name]  is a 33 y.o. male, with past medical history significant for HIV, diagnosed in October 2013 and placed on Atripla since then, presenting with altered mental status, anxiety , sweating and shaking . The patient has been having extreme weight loss for the last couple of weeks with frequent black stools at around 1-2 times per day. No nausea or vomiting, no chest pain or shortness of breath. In the emergency room his hemoglobin was found to be 5.6 and I was called to admit. He was also noted to have supraclavicular lymphadenopathy, bilaterally.     Review of Systems    In addition to the HPI above,  No Fever-chills, No Headache, No changes with Vision or hearing, No problems swallowing food or Liquids, No Chest pain, Cough or Shortness of Breath, Mild suprapubic Abdominal pain, No Nausea or Vommitting, Bowel movements are regular, No Blood in stool or Urine, No dysuria, No new skin rashes or bruises, No new joints pains-aches,  No new weakness, tingling, numbness in any extremity, No recent weight gain or loss, No polyuria, polydypsia or  polyphagia, No significant Mental Stressors.  A full 10 point Review of Systems was done, except as stated above, all other Review of Systems were negative.   Social History History  Substance Use Topics  . Smoking status: Current Every Day Smoker -- 1.00 packs/day for 15 years    Types: Cigarettes  . Smokeless tobacco: Never Used     Comment: quitline info, counseling  . Alcohol Use: No     Family History Significant for diabetes mellitus  Prior to Admission medications   Medication Sig Start Date End Date Taking? Authorizing Provider  atenolol (TENORMIN) 25 MG tablet Take 1 tablet (25 mg total) by mouth daily. 07/08/13  Yes [name], MD  efavirenz-emtricitabine-tenofovir (ATRIPLA) 600-200-300 MG per tablet Take 1 tablet by mouth at bedtime.   Yes Historical Provider, MD  ferrous sulfate 325 (65 FE) MG tablet Take 1 tablet (325 mg total) by mouth 2 (two) times daily with a meal. 06/24/13  Yes [name],  MD    Allergies  Allergen Reactions  . Tylenol [Acetaminophen]     sweats    Physical Exam  Vitals  Blood pressure 92/64, pulse 44, temperature 97.5 F (36.4 C), temperature source Axillary, resp. rate 11, weight 47.628 kg (105 lb), SpO2 95.00%.   1. General Young emaciated African American male  2. Normal affect and insight, Not Suicidal or Homicidal, Awake Alert, Oriented X 3.  3. No F.N deficits, ALL C.Nerves Intact, Strength 5/5 all 4 extremities, Sensation intact all 4 extremities, Plantars down going.  4. Ears and Eyes appear Normal, Conjunctivae clear, PERRLA. Moist Oral Mucosa.  5. Supple Neck, No JVD, No cervical lymphadenopathy appriciated, No Carotid Bruits. Supraclavicular lymph node enlargement noted  6. Symmetrical Chest wall movement, Good air movement bilaterally, CTAB.  7. RRR, No Gallops, Rubs or Murmurs, No Parasternal Heave.  8. Positive Bowel Sounds, Abdomen Soft, Non tender, No organomegaly appriciated,No rebound -guarding or  rigidity.  9.  No Cyanosis, Normal Skin Turgor, No Skin Rash or Bruise.  10. Decreased muscle mass,  joints appear normal , no effusions, Normal ROM.      Data Review  CBC  Recent Labs Lab 08/02/13 1620  WBC 4.6  HGB 5.6*  HCT 19.3*  PLT PENDING  MCV 99.5  MCH 28.9  MCHC 29.0*  RDW 26.1*  LYMPHSABS PENDING  MONOABS PENDING  EOSABS PENDING  BASOSABS PENDING   ------------------------------------------------------------------------------------------------------------------  Chemistries   Recent Labs Lab 08/02/13 1620  NA 143  K 3.8  CL 109  CO2 23  GLUCOSE 100*  BUN 18  CREATININE 0.62  CALCIUM 8.2*  AST 17  ALT 7  ALKPHOS 123*  BILITOT 0.3     - Dg Chest 2 View  08/02/2013   CLINICAL DATA:  Shortness of breath and chest pain  EXAM: CHEST  2 VIEW  COMPARISON:  06/09/2008  FINDINGS: The heart size and mediastinal contours are within normal limits. Both lungs are clear. The visualized skeletal structures are unremarkable.  IMPRESSION: No active cardiopulmonary disease.   Electronically Signed   By: Inez Catalina M.D.   On: 08/02/2013 17:20   Ct Head Wo Contrast  08/02/2013   CLINICAL DATA:  Dizziness, headache.  EXAM: CT HEAD WITHOUT CONTRAST  TECHNIQUE: Contiguous axial images were obtained from the base of the skull through the vertex without intravenous contrast.  COMPARISON:  None.  FINDINGS: Bony calvarium appears intact. No mass effect or midline shift is noted. Ventricular size is within normal limits. There is no evidence of mass lesion, hemorrhage or acute infarction.  IMPRESSION: No gross intracranial abnormality seen.   Electronically Signed   By: Sabino Dick M.D.   On: 08/02/2013 17:04     Assessment & Plan 1. Anemia, symptomatic   Transfuse 2 units of packed RBCs   Check CMV and C. difficile 2. HIV     Continue Atripla    According to patient his CD4 count is good. Does not remember it.    Followed by Dr. Dwyane Dee 3. Lymphadenopathy;  bilateral supraclavicular 4. altered mental status    CT of the head is negative      DVT Prophylaxis TED hoses  AM Labs Ordered, also please review Full Orders  Family Communication: Admission, patients condition and plan of care including tests being ordered have been discussed with the patient and father who indicate understanding and agree with the plan and Code Status.  Code Status   we willDisposition Plan: Home  Time spent in  minutes : 32 minutes  Condition GUARDED

## 2013-08-03 DIAGNOSIS — R627 Adult failure to thrive: Secondary | ICD-10-CM

## 2013-08-03 DIAGNOSIS — T68XXXA Hypothermia, initial encounter: Secondary | ICD-10-CM

## 2013-08-03 DIAGNOSIS — R599 Enlarged lymph nodes, unspecified: Secondary | ICD-10-CM

## 2013-08-03 DIAGNOSIS — I959 Hypotension, unspecified: Secondary | ICD-10-CM

## 2013-08-03 DIAGNOSIS — R680 Hypothermia, not associated with low environmental temperature: Secondary | ICD-10-CM

## 2013-08-03 DIAGNOSIS — B20 Human immunodeficiency virus [HIV] disease: Secondary | ICD-10-CM

## 2013-08-03 DIAGNOSIS — D649 Anemia, unspecified: Secondary | ICD-10-CM

## 2013-08-03 LAB — RPR: RPR Ser Ql: NONREACTIVE

## 2013-08-03 LAB — TROPONIN I
Troponin I: <0.3 ng/mL (ref <0.30)
Troponin I: <0.3 ng/mL (ref <0.30)

## 2013-08-03 LAB — CBC
HEMATOCRIT: 23.6 % — AB (ref 39.0–52.0)
Hemoglobin: 7.5 g/dL — ABNORMAL LOW (ref 13.0–17.0)
MCH: 29.9 pg (ref 26.0–34.0)
MCHC: 31.8 g/dL (ref 30.0–36.0)
MCV: 94 fL (ref 78.0–100.0)
Platelets: 77 10*3/uL — ABNORMAL LOW (ref 150–400)
RBC: 2.51 MIL/uL — ABNORMAL LOW (ref 4.22–5.81)
RDW: 22.7 % — AB (ref 11.5–15.5)
WBC: 3.2 10*3/uL — AB (ref 4.0–10.5)

## 2013-08-03 LAB — BASIC METABOLIC PANEL
BUN: 15 mg/dL (ref 6–23)
CALCIUM: 8.1 mg/dL — AB (ref 8.4–10.5)
CO2: 22 meq/L (ref 19–32)
CREATININE: 0.6 mg/dL (ref 0.50–1.35)
Chloride: 112 mEq/L (ref 96–112)
GFR calc Af Amer: >90 mL/min (ref >90)
GFR calc non Af Amer: >90 mL/min (ref >90)
GLUCOSE: 70 mg/dL (ref 70–99)
Potassium: 3.8 mEq/L (ref 3.7–5.3)
Sodium: 145 mEq/L (ref 137–147)

## 2013-08-03 LAB — LIPID PANEL
CHOL/HDL RATIO: 6.6 ratio
Cholesterol: 112 mg/dL (ref 0–200)
HDL: 17 mg/dL — ABNORMAL LOW (ref >39)
LDL CALC: 54 mg/dL (ref 0–99)
TRIGLYCERIDES: 204 mg/dL — AB (ref <150)
VLDL: 41 mg/dL — AB (ref 0–40)

## 2013-08-03 LAB — TSH: TSH: 3.813 u[IU]/mL (ref 0.350–4.500)

## 2013-08-03 LAB — CORTISOL: Cortisol, Plasma: 13.2 ug/dL

## 2013-08-03 LAB — LACTIC ACID, PLASMA: Lactic Acid, Venous: 1.1 mmol/L (ref 0.5–2.2)

## 2013-08-03 MED ORDER — SODIUM CHLORIDE 0.9 % IV SOLN
INTRAVENOUS | Status: DC
  Administered 2013-08-04 – 2013-08-08 (×8): via INTRAVENOUS

## 2013-08-03 MED ORDER — SODIUM CHLORIDE 0.9 % IV BOLUS (SEPSIS)
1000.0000 mL | Freq: Once | INTRAVENOUS | Status: AC
  Administered 2013-08-03: 1000 mL via INTRAVENOUS

## 2013-08-03 NOTE — Progress Notes (Signed)
Pt transfused 2 units of blood. Procedure well tolerated. Bp still same with baseline. Temp below normal. No acute distress noted. Will continue to monitor.

## 2013-08-03 NOTE — Progress Notes (Signed)
Pt on BT no adverse reaction noted. Tolerated well. C/o hunger, educated about diet restrictions. Will continue to monitor.

## 2013-08-03 NOTE — Progress Notes (Signed)
Pt is getting second bag of blood transfusion. No adverse reaction. Will continue to monitor.

## 2013-08-03 NOTE — Progress Notes (Signed)
Patient to be transferred to [place] for closer observation. Report called and all questions answered. Pt contacted his father to tell him of new room assignment.   Samanthan Dugo J. Conley Canal RN

## 2013-08-03 NOTE — Progress Notes (Signed)
EKG done - showed normal sinus rhythm. Rogue Bussing, Np floor cover made aware of result.

## 2013-08-03 NOTE — Progress Notes (Signed)
Pt still has low blood pressure and low temperature. [place] floor cover came up and checked pt. Pt c/o chest pain, stat ekg placed on order. Pt stated he wanted ginger ale for him to burp but Probation officer on this note reminded pt that he is still on NPO. Awaiting for result.

## 2013-08-03 NOTE — Progress Notes (Signed)
[facility] progress note. Chief complaint. Hypothermia, hypotension. History of present illness. This 33 year old male in hospital with weakness, anorexia, extreme weight loss. His hemoglobin was noted to be 5.6 on admission. He has a known history of HIV. Patient also with diarrhea once or twice daily with C. difficile and CMV pending. Patient is currently being transfused the second to of 2 units of packed red blood cells. Nursing noted patient was hypothermic rectal temperature about 94-95 range. I ordered a heating blanket though this is not improved patient's temperature significantly. Patient's blood pressure is also been soft with systolic in the 80 range. I came to see the patient at bedside to further assess. Patient is alert and his only complaint is some mild chest pain with a feeling that food has gotten stuck on the way to his stomach. He denies shortness of breath, productive cough, dysuria or abdominal pain. Patient had a urinalysis and chest x-ray on admission that did not appear suggestive of infection. Vital signs. Temperature 93.7, pulse 63, respiration 15, blood pressure 84/56. O2 sats 100%. General appearance. Frail thin middle-aged male who is alert in no distress. Cardiac. Rate and rhythm regular. Lungs. Breath sounds clear and equal. Abdomen. Soft with positive bowel sounds. No pain. Urinary genital. No pain with palpation over the bladder. No CVA tenderness. Impression/plan Problem #1. Hypothermia/hypotension. Etiology at this point is unclear. I see no indication of an adverse reaction to blood products. Sepsis is a consideration though the patient does not look clinically ill. I will obtain a lactic acid to further evaluate. CBC is pending post completion of packed red blood cell transfusion. I will move patient to a step down unit for closer monitoring. Problem #2. Chest pain. We'll obtain a stat EKG and review. From patient's description this sounds more like reflux  than cardiac pain. There is no radiation, diaphoresis, or associated nausea. Patient states his sensation is like something stuck in his esophagus. Will obtain troponin now and then every 6 hours for a total of 3 sets. We'll repeat an EKG in 6 hours to evaluate for any changes.

## 2013-08-03 NOTE — Progress Notes (Signed)
Utilization review completed.  

## 2013-08-03 NOTE — Progress Notes (Signed)
[facility] PROGRESS NOTE  Thomas [name] ZOX:[id number] DOB: 07-Mar-1981 DOA: 08/02/2013 PCP: Philis Fendt, MD  Assessment/Plan: 1. Anemia, symptomatic  -Hemoglobin 7.5 status post Transfusion of 2 units of packed RBCs  -He is followed by Dr. Alen Blew and last seen in December and started on by mouth iron supplements>> with plans for IV iron if the iron deficiency was persisting -Follow consult him GI, heme to see am  -Follow and transfuse as appropriate  2.Hypotension with hypothermia -Infection versus hypovolemia versus meds -Obtain urine cultures, agree with blood, stool cultures,  C. Difficile -DC atenolol -Also obtain TSH, cortisol levels and follow -Fluid resuscitation, warm IV fluids -Continue Bear hugger 2. HIV/AIDS Continue Atripla  -Appreciate ID input 3. Lymphadenopathy; bilateral supraclavicular  -Surgery consult in a.m. for lymph node biopsy 4. altered mental status  CT of the head is negative -He is mentating well this a.m.   Code Status: full Family Communication: None at bedside Disposition Plan: Keep in step down   Consultants:  Infectious disease  Procedures:  None  Antibiotics:  None  HPI/Subjective: Denies abdominal pain today. States he's been having loose stools1-2/day for about a month, but none today. He denies nausea/vomiting  Objective: Filed Vitals:   08/03/13 1338  BP:   Pulse:   Temp: 95.7 F (35.4 C)  Resp:     Intake/Output Summary (Last 24 hours) at 08/03/13 1349 Last data filed at 08/03/13 1300  Gross per 24 hour  Intake    850 ml  Output    600 ml  Net    250 ml   Filed Weights   08/02/13 1705 08/02/13 1949 08/03/13 1135  Weight: 47.628 kg (105 lb) 48.3 kg (106 lb 7.7 oz) 48.3 kg (106 lb 7.7 oz)    Exam:  General: Thin appearing young male, alert & oriented x 3 In NAD Cardiovascular: RRR, nl S1 s2 Respiratory: CTAB Abdomen: soft +BS NT/ND, no masses palpable Extremities: No cyanosis and no  edema    Data Reviewed: Basic Metabolic Panel:  Recent Labs Lab 08/02/13 1620 08/03/13 0710  NA 143 145  K 3.8 3.8  CL 109 112  CO2 23 22  GLUCOSE 100* 70  BUN 18 15  CREATININE 0.62 0.60  CALCIUM 8.2* 8.1*   Liver Function Tests:  Recent Labs Lab 08/02/13 1620  AST 17  ALT 7  ALKPHOS 123*  BILITOT 0.3  PROT 6.8  ALBUMIN 1.8*    Recent Labs Lab 08/02/13 1620  LIPASE 19   No results found for this basename: AMMONIA,  in the last 168 hours CBC:  Recent Labs Lab 08/02/13 1620 08/03/13 0630  WBC 4.6 3.2*  NEUTROABS 1.2*  --   HGB 5.6* 7.5*  HCT 19.3* 23.6*  MCV 99.5 94.0  PLT 103* 77*   Cardiac Enzymes:  Recent Labs Lab 08/03/13 0540 08/03/13 1005  TROPONINI <0.30 <0.30   BNP (last 3 results) No results found for this basename: PROBNP,  in the last 8760 hours CBG: No results found for this basename: GLUCAP,  in the last 168 hours  No results found for this or any previous visit (from the past 240 hour(s)).   Studies: Dg Chest 2 View  08/02/2013   CLINICAL DATA:  Shortness of breath and chest pain  EXAM: CHEST  2 VIEW  COMPARISON:  06/09/2008  FINDINGS: The heart size and mediastinal contours are within normal limits. Both lungs are clear. The visualized skeletal structures are unremarkable.  IMPRESSION: No active cardiopulmonary disease.  Electronically Signed   By: Inez Catalina M.D.   On: 08/02/2013 17:20   Ct Head Wo Contrast  08/02/2013   CLINICAL DATA:  Dizziness, headache.  EXAM: CT HEAD WITHOUT CONTRAST  TECHNIQUE: Contiguous axial images were obtained from the base of the skull through the vertex without intravenous contrast.  COMPARISON:  None.  FINDINGS: Bony calvarium appears intact. No mass effect or midline shift is noted. Ventricular size is within normal limits. There is no evidence of mass lesion, hemorrhage or acute infarction.  IMPRESSION: No gross intracranial abnormality seen.   Electronically Signed   By: Sabino Dick M.D.   On:  08/02/2013 17:04    Scheduled Meds: . atenolol  25 mg Oral Daily  . efavirenz-emtricitabine-tenofovir  1 tablet Oral QHS  . ferrous sulfate  325 mg Oral BID WC  . pantoprazole (PROTONIX) IV  40 mg Intravenous Q12H  . sodium chloride  1,000 mL Intravenous Once   Continuous Infusions: . sodium chloride 75 mL/hr at 08/02/13 1917  . sodium chloride 125 mL/hr at 08/03/13 1200    Principal Problem:   Anemia, unspecified Active Problems:   Anemia    Time spent: [facility] Pager [phone]. If 7PM-7AM, please contact night-coverage at [email or web address], password [facility] 08/03/2013, 1:49 PM  LOS: 1 day

## 2013-08-03 NOTE — Consult Note (Addendum)
INFECTIOUS DISEASE CONSULT NOTE  Date of Admission:  08/02/2013  Date of Consult:  08/03/2013  Reason for Consult: AIDS Referring Physician: Inis Sizer  Impression/Recommendation AIDS Failure to thrive Hypothermia Anemia LAN  Would Check BCx routine and AFB Check CD4 and HIV RNA with genotype Would ask heme to see in AM Would ask GI to see him in AM Would ask general surgery to see in AM for LN Bx Check TSH, CMV PCR, RPR, cortisol Transfuse PRN Consider CT chest/abd/pelvis for lymphoma eval  Comment- His previous CD4 would seem to preclude MAI as the cause of this syndrome. It would be classic for this scenario. He could have lymphoma, possible with higher CD4. As well, KS could present like this (although his previous CD4 was too high and he does not have skin lesions).  He does not appear septic (lactate normal, normal mentation).   Thank you so much for this interesting consult,   Bobby Rumpf (pager) [phone] [email or web address]  Thomas [name] is an 33 y.o. male.  HPI: 33 yo M with AIDS (on atripla, Dx.2013, last CD4 ) and anemia. He was seen in ID clinic on 12-16 and appeared to be doing fairly well.  HIV 1 RNA Quant (copies/mL)  Date Value  06/17/2013 23*  02/06/2013 31*  09/26/2012 33*     CD4 T Cell Abs (/uL)  Date Value  06/17/2013 370*  02/06/2013 730   09/26/2012 1390    He comes to ED 1-10 with anxiety, sweating, rigors. He also complained of black stools and has lost 6 kg since December. He was previously on megace however this made him nauseated and he stopped it. In ED he was found to have HCT 19.3 (previous 22.8). Since admission he has been hypothermic and hypotensive. He has been transfused 2 u PRBC.    Past Medical History  Diagnosis Date  . HIV disease 06/25/2012  . Skin lesion 07/03/2012  . Leg pain 07/03/2012  . Sinus tachycardia 08/06/2012  . Penile cyst 08/06/2012    History reviewed. No pertinent past surgical history.   Allergies    Allergen Reactions  . Tylenol [Acetaminophen]     sweats    Medications:  Scheduled: . atenolol  25 mg Oral Daily  . efavirenz-emtricitabine-tenofovir  1 tablet Oral QHS  . ferrous sulfate  325 mg Oral BID WC  . pantoprazole (PROTONIX) IV  40 mg Intravenous Q12H  . sodium chloride  1,000 mL Intravenous Once    Total days of antibiotics: 0          Social History:  reports that he has been smoking Cigarettes.  He has a 15 pack-year smoking history. He has never used smokeless tobacco. He reports that he does not drink alcohol or use illicit drugs.  History reviewed. No pertinent family history. Denies- father with previous boils  General ROS: no oral ulcers, loose BM > 1 month, drak green. no SOb, no cough, +rhinorrhea. no sore throat. no dysuria. cramping in hands, no paresthesias, see HPI.   Blood pressure 92/64, pulse 54, temperature 93.4 F (34.1 C), temperature source Oral, resp. rate 15, height _0  (1.854 m), weight 48.3 kg (106 lb 7.7 oz), SpO2 100.00%. General appearance: alert, cooperative and no distress Eyes: negative findings: conjunctivae and sclerae normal and pupils equal, round, reactive to light and accomodation Throat: normal findings: oropharynx pink & moist without lesions or evidence of thrush Neck: supple, symmetrical, trachea midline Lungs: clear to auscultation bilaterally Heart: regular rate and rhythm Abdomen:  normal findings: bowel sounds normal and soft, non-tender Extremities: edema none and tenderness of ankles with gripping Lymph nodes: Supraclavicular adenopathy: large, rubbery, mobile LN in R supraclavicular fossa. ~ 3cm   Results for orders placed during the hospital encounter of 08/02/13 (from the past 48 hour(s))  CBC WITH DIFFERENTIAL     Status: Abnormal   Collection Time    08/02/13  4:20 PM      Result Value Range   WBC 4.6  4.0 - 10.5 K/uL   RBC 1.94 (*) 4.22 - 5.81 MIL/uL   Hemoglobin 5.6 (*) 13.0 - 17.0 g/dL   Comment:  CRITICAL RESULT CALLED TO, READ BACK BY AND VERIFIED WITH:     PRICE,C RN 08/02/13 1713 WOOTEN,K   HCT 19.3 (*) 39.0 - 52.0 %   MCV 99.5  78.0 - 100.0 fL   MCH 28.9  26.0 - 34.0 pg   MCHC 29.0 (*) 30.0 - 36.0 g/dL   RDW 26.1 (*) 11.5 - 15.5 %   Platelets 103 (*) 150 - 400 K/uL   Comment: PLATELET COUNT CONFIRMED BY SMEAR   Neutrophils Relative % 27 (*) 43 - 77 %   Lymphocytes Relative 53 (*) 12 - 46 %   Monocytes Relative 19 (*) 3 - 12 %   Eosinophils Relative 0  0 - 5 %   Basophils Relative 1  0 - 1 %   Neutro Abs 1.2 (*) 1.7 - 7.7 K/uL   Lymphs Abs 2.5  0.7 - 4.0 K/uL   Monocytes Absolute 0.9  0.1 - 1.0 K/uL   Eosinophils Absolute 0.0  0.0 - 0.7 K/uL   Basophils Absolute 0.0  0.0 - 0.1 K/uL   RBC Morphology POLYCHROMASIA PRESENT    COMPREHENSIVE METABOLIC PANEL     Status: Abnormal   Collection Time    08/02/13  4:20 PM      Result Value Range   Sodium 143  137 - 147 mEq/L   Potassium 3.8  3.7 - 5.3 mEq/L   Chloride 109  96 - 112 mEq/L   CO2 23  19 - 32 mEq/L   Glucose, Bld 100 (*) 70 - 99 mg/dL   BUN 18  6 - 23 mg/dL   Creatinine, Ser 0.62  0.50 - 1.35 mg/dL   Calcium 8.2 (*) 8.4 - 10.5 mg/dL   Total Protein 6.8  6.0 - 8.3 g/dL   Albumin 1.8 (*) 3.5 - 5.2 g/dL   AST 17  0 - 37 U/L   ALT 7  0 - 53 U/L   Alkaline Phosphatase 123 (*) 39 - 117 U/L   Total Bilirubin 0.3  0.3 - 1.2 mg/dL   GFR calc non Af Amer >90  >90 mL/min   GFR calc Af Amer >90  >90 mL/min   Comment: (NOTE)     The eGFR has been calculated using the CKD EPI equation.     This calculation has not been validated in all clinical situations.     eGFR's persistently <90 mL/min signify possible Chronic Kidney     Disease.  LIPASE, BLOOD     Status: None   Collection Time    08/02/13  4:20 PM      Result Value Range   Lipase 19  11 - 59 U/L  TYPE AND SCREEN     Status: None   Collection Time    08/02/13  5:35 PM      Result Value Range   ABO/RH(D) O POS  Antibody Screen NEG     Sample Expiration  08/05/2013     Unit Number [id number]     Blood Component Type RED CELLS,LR     Unit division 00     Status of Unit ISSUED     Transfusion Status OK TO TRANSFUSE     Crossmatch Result Compatible     Unit Number [id number]     Blood Component Type RED CELLS,LR     Unit division 00     Status of Unit ISSUED     Transfusion Status OK TO TRANSFUSE     Crossmatch Result Compatible    PREPARE RBC (CROSSMATCH)     Status: None   Collection Time    08/02/13  5:35 PM      Result Value Range   Order Confirmation ORDER PROCESSED BY BLOOD BANK    ABO/RH     Status: None   Collection Time    08/02/13  5:35 PM      Result Value Range   ABO/RH(D) O POS    URINALYSIS, ROUTINE W REFLEX MICROSCOPIC     Status: Abnormal   Collection Time    08/02/13  5:38 PM      Result Value Range   Color, Urine YELLOW  YELLOW   APPearance CLOUDY (*) CLEAR   Specific Gravity, Urine 1.015  1.005 - 1.030   pH 7.0  5.0 - 8.0   Glucose, UA NEGATIVE  NEGATIVE mg/dL   Hgb urine dipstick LARGE (*) NEGATIVE   Bilirubin Urine NEGATIVE  NEGATIVE   Ketones, ur NEGATIVE  NEGATIVE mg/dL   Protein, ur 30 (*) NEGATIVE mg/dL   Urobilinogen, UA 0.2  0.0 - 1.0 mg/dL   Nitrite NEGATIVE  NEGATIVE   Leukocytes, UA SMALL (*) NEGATIVE  URINE MICROSCOPIC-ADD ON     Status: None   Collection Time    08/02/13  5:38 PM      Result Value Range   WBC, UA 7-10  <3 WBC/hpf   RBC / HPF TOO NUMEROUS TO COUNT  <3 RBC/hpf   Urine-Other AMORPHOUS URATES/PHOSPHATES    PREPARE RBC (CROSSMATCH)     Status: None   Collection Time    08/02/13  8:30 PM      Result Value Range   Order Confirmation ORDER PROCESSED BY BLOOD BANK    PREPARE RBC (CROSSMATCH)     Status: None   Collection Time    08/03/13 12:30 AM      Result Value Range   Order Confirmation BB SAMPLE OR UNITS ALREADY AVAILABLE    TROPONIN I     Status: None   Collection Time    08/03/13  5:40 AM      Result Value Range   Troponin I <0.30  <0.30 ng/mL   Comment:             Due to the release kinetics of cTnI,     a negative result within the first hours     of the onset of symptoms does not rule out     myocardial infarction with certainty.     If myocardial infarction is still suspected,     repeat the test at appropriate intervals.  LACTIC ACID, PLASMA     Status: None   Collection Time    08/03/13  5:40 AM      Result Value Range   Lactic Acid, Venous 1.1  0.5 - 2.2 mmol/L  CBC     Status: Abnormal  Collection Time    08/03/13  6:30 AM      Result Value Range   WBC 3.2 (*) 4.0 - 10.5 K/uL   RBC 2.51 (*) 4.22 - 5.81 MIL/uL   Hemoglobin 7.5 (*) 13.0 - 17.0 g/dL   Comment: POST TRANSFUSION SPECIMEN   HCT 23.6 (*) 39.0 - 52.0 %   MCV 94.0  78.0 - 100.0 fL   MCH 29.9  26.0 - 34.0 pg   MCHC 31.8  30.0 - 36.0 g/dL   RDW 22.7 (*) 11.5 - 15.5 %   Platelets 77 (*) 150 - 400 K/uL   Comment: DELTA CHECK NOTED     REPEATED TO VERIFY     PLATELET COUNT CONFIRMED BY SMEAR  BASIC METABOLIC PANEL     Status: Abnormal   Collection Time    08/03/13  7:10 AM      Result Value Range   Sodium 145  137 - 147 mEq/L   Potassium 3.8  3.7 - 5.3 mEq/L   Chloride 112  96 - 112 mEq/L   CO2 22  19 - 32 mEq/L   Glucose, Bld 70  70 - 99 mg/dL   BUN 15  6 - 23 mg/dL   Creatinine, Ser 0.60  0.50 - 1.35 mg/dL   Calcium 8.1 (*) 8.4 - 10.5 mg/dL   GFR calc non Af Amer >90  >90 mL/min   GFR calc Af Amer >90  >90 mL/min   Comment: (NOTE)     The eGFR has been calculated using the CKD EPI equation.     This calculation has not been validated in all clinical situations.     eGFR's persistently <90 mL/min signify possible Chronic Kidney     Disease.  TROPONIN I     Status: None   Collection Time    08/03/13 10:05 AM      Result Value Range   Troponin I <0.30  <0.30 ng/mL   Comment:            Due to the release kinetics of cTnI,     a negative result within the first hours     of the onset of symptoms does not rule out     myocardial infarction with  certainty.     If myocardial infarction is still suspected,     repeat the test at appropriate intervals.   No results found for this basename: sdes, specrequest, cult, reptstatus   Dg Chest 2 View  08/02/2013   CLINICAL DATA:  Shortness of breath and chest pain  EXAM: CHEST  2 VIEW  COMPARISON:  06/09/2008  FINDINGS: The heart size and mediastinal contours are within normal limits. Both lungs are clear. The visualized skeletal structures are unremarkable.  IMPRESSION: No active cardiopulmonary disease.   Electronically Signed   By: Inez Catalina M.D.   On: 08/02/2013 17:20   Ct Head Wo Contrast  08/02/2013   CLINICAL DATA:  Dizziness, headache.  EXAM: CT HEAD WITHOUT CONTRAST  TECHNIQUE: Contiguous axial images were obtained from the base of the skull through the vertex without intravenous contrast.  COMPARISON:  None.  FINDINGS: Bony calvarium appears intact. No mass effect or midline shift is noted. Ventricular size is within normal limits. There is no evidence of mass lesion, hemorrhage or acute infarction.  IMPRESSION: No gross intracranial abnormality seen.   Electronically Signed   By: Sabino Dick M.D.   On: 08/02/2013 17:04   No results found for this or any  previous visit (from the past 240 hour(s)).    08/03/2013, 12:13 PM     LOS: 1 day

## 2013-08-03 NOTE — Progress Notes (Signed)
Temp trending up now- 96.2 still on hugger (at low setting). Will continue to monitor.

## 2013-08-03 NOTE — Progress Notes (Signed)
Pt arrived from 5W-placed back on bear hugger-current temp 93.4 orally, pt refusing rectal temp. Oriented to unit and routine, call bell within reach. Notified CMT and eLink. Pt requesting to eat-currently NPO. Notified attending, new orders given, will carry out and continue to monitor.

## 2013-08-04 ENCOUNTER — Encounter (HOSPITAL_COMMUNITY): Payer: Self-pay | Admitting: *Deleted

## 2013-08-04 ENCOUNTER — Inpatient Hospital Stay (HOSPITAL_COMMUNITY): Payer: Medicare Other

## 2013-08-04 DIAGNOSIS — E43 Unspecified severe protein-calorie malnutrition: Secondary | ICD-10-CM | POA: Insufficient documentation

## 2013-08-04 DIAGNOSIS — D72819 Decreased white blood cell count, unspecified: Secondary | ICD-10-CM

## 2013-08-04 DIAGNOSIS — D696 Thrombocytopenia, unspecified: Secondary | ICD-10-CM

## 2013-08-04 DIAGNOSIS — R599 Enlarged lymph nodes, unspecified: Secondary | ICD-10-CM | POA: Diagnosis not present

## 2013-08-04 DIAGNOSIS — D509 Iron deficiency anemia, unspecified: Principal | ICD-10-CM

## 2013-08-04 LAB — T-HELPER CELLS (CD4) COUNT (NOT AT ARMC)
CD4 % Helper T Cell: 45 % (ref 33–55)
CD4 T Cell Abs: 660 /uL (ref 400–2700)

## 2013-08-04 LAB — BASIC METABOLIC PANEL
BUN: 12 mg/dL (ref 6–23)
CALCIUM: 7.5 mg/dL — AB (ref 8.4–10.5)
CO2: 20 mEq/L (ref 19–32)
Chloride: 111 mEq/L (ref 96–112)
Creatinine, Ser: 0.71 mg/dL (ref 0.50–1.35)
GFR calc Af Amer: >90 mL/min (ref >90)
GLUCOSE: 84 mg/dL (ref 70–99)
Potassium: 3.6 mEq/L — ABNORMAL LOW (ref 3.7–5.3)
Sodium: 141 mEq/L (ref 137–147)

## 2013-08-04 LAB — TYPE AND SCREEN
ABO/RH(D): O POS
ANTIBODY SCREEN: NEGATIVE
UNIT DIVISION: 0
Unit division: 0

## 2013-08-04 LAB — CBC
HEMATOCRIT: 22.5 % — AB (ref 39.0–52.0)
HEMOGLOBIN: 7.1 g/dL — AB (ref 13.0–17.0)
MCH: 30 pg (ref 26.0–34.0)
MCHC: 31.6 g/dL (ref 30.0–36.0)
MCV: 94.9 fL (ref 78.0–100.0)
Platelets: 86 10*3/uL — ABNORMAL LOW (ref 150–400)
RBC: 2.37 MIL/uL — ABNORMAL LOW (ref 4.22–5.81)
RDW: 24 % — ABNORMAL HIGH (ref 11.5–15.5)
WBC: 3.3 10*3/uL — ABNORMAL LOW (ref 4.0–10.5)

## 2013-08-04 LAB — OCCULT BLOOD X 1 CARD TO LAB, STOOL: FECAL OCCULT BLD: NEGATIVE

## 2013-08-04 LAB — GC/CHLAMYDIA PROBE AMP
CT Probe RNA: NEGATIVE
GC Probe RNA: NEGATIVE

## 2013-08-04 LAB — PREPARE RBC (CROSSMATCH)

## 2013-08-04 MED ORDER — ENSURE COMPLETE PO LIQD
237.0000 mL | Freq: Three times a day (TID) | ORAL | Status: DC
  Administered 2013-08-04 – 2013-08-07 (×8): 237 mL via ORAL

## 2013-08-04 MED ORDER — IOHEXOL 300 MG/ML  SOLN
100.0000 mL | Freq: Once | INTRAMUSCULAR | Status: AC | PRN
  Administered 2013-08-04: 100 mL via INTRAVENOUS

## 2013-08-04 MED ORDER — IOHEXOL 300 MG/ML  SOLN
25.0000 mL | INTRAMUSCULAR | Status: AC
  Administered 2013-08-04 (×2): 25 mL via ORAL

## 2013-08-04 NOTE — Consult Note (Addendum)
[facility] Consult: 2:02 PM 08/04/2013  LOS: 2 days    Referring Provider:  Dr Philipp Deputy  Primary Care Physician:  Philis Fendt, MD Primary Gastroenterologist:  Althia Forts.     Reason for Consultation:  Anemia and black stools.    HPI: Thomas [name] is a 33 y.o. male. Has HIV, diagnosed 04/2012.  On Atripla since then.  Latest CD 4 count 660.  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  Admitted 2 days ago with weight loss, black stools. Has been transfused with 2 units PRBCs.  Hgb was 5.6, compared to 6.9 on 07/02/13, 6.2 on 06/17/13, 10.0 in 09/2012. For about one week pt was having loose BM one per day, intermixed with some days of formed stool.  Greenish dark brown (on oral iron).  Mild, limited mid abdominal cramping.  No nausea. No gross blood. Baseline BM is formed, dark brown 2 to 3 times per day.   Stool today is again formed, to the point that specimen sent for C diff was refused by lab.  Stool culture in process.  Not yet sent for FOBT.   Started on oral iron in November 2014. Mostly compliant with this except for a few days when he ran out and delayed refill of prescription.  Followed by Dr Alen Blew as of 07/02/2013 for the anemia. He noted normal retic count, low iron and iron saturation, ferritin 4229, stated "peripheral smear strongly suggest iron deficiency with microcytosis and hypochromia".  He suspected cause was his HIV/chronic disease.  Not convinced it was his Atripla, though remotely possible.   He offered parenteral iron but pt preferred to stay on oral iron.   Has been losing weight (122 to 106# in one month) and previously started on Megace for anorexia.   Stopped taking Megace at Christmas 2014 when he says it caused nausea.      Never had problems with heartburn, dysphagia.  No ASA products or NSAIDs.  No unusual bleeding or bruising.  Never before transfused.     Past Medical History  Diagnosis Date  . HIV disease 06/25/2012  . Skin lesion 07/03/2012  . Leg pain 07/03/2012  . Sinus tachycardia 08/06/2012  . Penile cyst 08/06/2012    Past Surgical History  Procedure Laterality Date  . 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  this after several previous I and Ds of rectal abcess.     Prior to Admission medications   Medication Sig Start Date End Date Taking? Authorizing Provider  atenolol (TENORMIN) 25 MG tablet Take 1 tablet (25 mg total) by mouth daily. 07/08/13  Yes [name], MD  efavirenz-emtricitabine-tenofovir (ATRIPLA) 600-200-300 MG per tablet Take 1 tablet by mouth at bedtime.   Yes Historical Provider, MD  ferrous sulfate 325 (65 FE) MG tablet Take 1 tablet (325 mg total) by mouth 2 (two) times daily with a meal. 06/24/13  Yes [name], MD  Scheduled Meds: . efavirenz-emtricitabine-tenofovir  1 tablet Oral QHS  . ferrous sulfate  325 mg Oral BID WC  . pantoprazole (PROTONIX) IV  40 mg Intravenous Q12H   Infusions: . sodium chloride 75 mL/hr at 08/02/13 1917  . sodium chloride 125 mL/hr at 08/04/13 1010   PRN Meds: acetaminophen, acetaminophen, HYDROcodone-acetaminophen, morphine injection, ondansetron (ZOFRAN) IV, ondansetron, zolpidem   Allergies as of 08/02/2013 - Review Complete 08/02/2013  Allergen Reaction Noted  . Tylenol [acetaminophen]  08/02/2013    History reviewed. No pertinent family history.  History   Social History  . Marital Status: Single    Spouse Name: N/A    Number of Children: N/A  . Years of Education: N/A   Occupational History  . Not on file.   Social History Main Topics  . Smoking status: Current Every  Day Smoker -- 1.00 packs/day for 15 years    Types: Cigarettes  . Smokeless tobacco: Never Used     Comment: quitline info, counseling  . Alcohol Use: No  . Drug Use: No     + UDS for THC on 06/24/2013  . Sexual Activity: Not Currently    Partners: Female    Birth Control/ Protection: Condom     Comment: declined condoms   Other Topics Concern  . Not on file   Social History Narrative  . No narrative on file    REVIEW OF SYSTEMS: Constitutional:  As per [facility] ENT:  No nose bleeds Pulm:  No cough or dyspnea.  CV:  No palpitations, no LE edema.  GU:  No hematuria, no frequency GI:  Per HPI.   Heme:  Per HPI   Transfusions:  Per HPI Neuro:  No headaches, no peripheral tingling or numbness Derm:  No itching, no rash or sores.  Endocrine:  No sweats or chills.  No polyuria or dysuria Immunization:  Prior Hep A vaccine, up to date flu vaccine and Tdap and pneumovax.  Hepatitis B surface Ab positive, , Hep B core AB and surface Ag negative.  Thinks he has been vaccinated for Hep B but it is not documented Travel:  None beyond local counties in last few months.    PHYSICAL EXAM: Vital signs in last 24 hours: Filed Vitals:   08/04/13 1107  BP: 111/75  Pulse: 95  Temp: 97.4 F (36.3 C)  Resp: 19   Wt Readings from Last 3 Encounters:  08/03/13 48.3 kg (106 lb 7.7 oz)  07/08/13 54.432 kg (120 lb)  07/02/13 55.339 kg (122 lb)    General: cachectic looking AAM, not toxic Head:  No swelling or asymmetry  Eyes:  No icterus or pallor Ears:  Not HOH  Nose:  No discharge or congestion Mouth:  No lesions or exudates Neck:  No mass , no bruits, no JVD Lungs:  Clear bil   No cough or dyspnea Heart: RRR Abdomen:  Soft, NT, ND.  No mass or HSM.  No bruits..   Rectal: pt declined rectal exam   Musc/Skeltl: no deformity or swelling Extremities:  + Pedal edema  Neurologic:  Oriented x3.  No tremor.  No limb weakness.  Skin:  No telangectasia Tattoos:  On arm Nodes:  No cervical  adenopathy.    Psych:  Cooperative, relaxed, pleasant.   Intake/Output from previous day: 01/11 0701 - 01/12 0700 In: 1975 [P.O.:600; I.V.:1375] Out: 1300 [Urine:1300] Intake/Output this shift: Total I/O In: 3105 [P.O.:480; I.V.:625; Other:2000] Out: 1225 [Urine:1225]  LAB RESULTS:  Recent Labs  08/02/13 1620  08/03/13 0630 08/04/13 0339  WBC 4.6 3.2* 3.3*  HGB 5.6* 7.5* 7.1*  HCT 19.3* 23.6* 22.5*  PLT 103* 77* 86*   BMET Lab Results  Component Value Date   NA 141 08/04/2013   NA 145 08/03/2013   NA 143 08/02/2013   K 3.6* 08/04/2013   K 3.8 08/03/2013   K 3.8 08/02/2013   CL 111 08/04/2013   CL 112 08/03/2013   CL 109 08/02/2013   CO2 20 08/04/2013   CO2 22 08/03/2013   CO2 23 08/02/2013   GLUCOSE 84 08/04/2013   GLUCOSE 70 08/03/2013   GLUCOSE 100* 08/02/2013   BUN 12 08/04/2013   BUN 15 08/03/2013   BUN 18 08/02/2013   CREATININE 0.71 08/04/2013   CREATININE 0.60 08/03/2013   CREATININE 0.62 08/02/2013   CALCIUM 7.5* 08/04/2013   CALCIUM 8.1* 08/03/2013   CALCIUM 8.2* 08/02/2013   LFT  Recent Labs  08/02/13 1620  PROT 6.8  ALBUMIN 1.8*  AST 17  ALT 7  ALKPHOS 123*  BILITOT 0.3   PT/INR No results found for this basename: INR,  PROTIME   Hepatitis Panel No results found for this basename: HEPBSAG, HCVAB, HEPAIGM, HEPBIGM,  in the last 72 hours C-Diff No components found with this basename: cdiff   Lipase     Component Value Date/Time   LIPASE 19 08/02/2013 1620    Drugs of Abuse     Component Value Date/Time   LABOPIA NEG 06/24/2013 1234   COCAINSCRNUR NEG 06/24/2013 1234   COCAINSCRNUR NONE DETECTED 06/09/2008 0123   LABBENZ NEG 06/24/2013 1234   LABBENZ NONE DETECTED 06/09/2008 0123   AMPHETMU NEG 06/24/2013 1234    RADIOLOGY STUDIES: Dg Chest 2 View 08/02/2013   CLINICAL DATA:  Shortness of breath and chest pain  EXAM: CHEST  2 VIEW  COMPARISON:  06/09/2008  FINDINGS: The heart size and mediastinal contours are within normal limits. Both lungs are  clear. The visualized skeletal structures are unremarkable.  IMPRESSION: No active cardiopulmonary disease.   Electronically Signed   By: Inez Catalina M.D.   On: 08/02/2013 17:20   Ct Head Wo Contrast 08/02/2013   CLINICAL DATA:  Dizziness, headache.  EXAM: CT HEAD WITHOUT CONTRAST  TECHNIQUE: Contiguous axial images were obtained from the base of the skull through the vertex without intravenous contrast.  COMPARISON:  None.  FINDINGS: Bony calvarium appears intact. No mass effect or midline shift is noted. Ventricular size is within normal limits. There is no evidence of mass lesion, hemorrhage or acute infarction.  IMPRESSION: No gross intracranial abnormality seen.   Electronically Signed   By: Sabino Dick M.D.   On: 08/02/2013 17:04    ENDOSCOPIC STUDIES: None ever  IMPRESSION:   *  Chronic anemia.  Slightly worse at admission, despite compliance with po Iron.    *  Loose stools, dark but not melenic or bloody.  Resolved.  Had formed stool today.  Enteric iron causes dark stools and can cause FOBT + stools.  Not convinced of a gi source for the anemia.   *  Weight loss.  Stopped taking Megace after Christmas, when it started causing nausea.   * HIV disease, compliant with Atripla. CD 4 count is 660, c/w low of 370 6 weeks ago.  General downward trend over last 6 months. HIV RNA count overall reduced c/w one year ago.   *  S/p drainage of peri rectal abcess and rectal fisurotomy 08/2010.    PLAN:     *  Await FOBT testing.    Azucena Freed  08/04/2013, 2:02 PM Pager: [phone]       Attending physician's note   I have taken a history, examined the patient and reviewed the chart. I agree with the Advanced Practitioner's note, impression and recommendations. He had a Hematology consult with Dr. [name] in 06/2013. His impression was iron deficiency anemia with possible anemia of chronic disease or anemia related to medication side effects. Agree with fe supplements and would  consider IV Fe while in the hospital. Not convinced he has significant GI blood loss contributing. Awaiting stool hemoccults. Outpatient follow up with Dr. [name].  Ladene Artist, MD [facility]

## 2013-08-04 NOTE — Progress Notes (Signed)
[facility] PROGRESS NOTE  Thomas [name] NKN:[id number] DOB: 1980-09-30 DOA: 08/02/2013 PCP: Philis Fendt, MD  Assessment/Plan: 1. Anemia, symptomatic  -Hemoglobin 7.5 status post Transfusion of 2 units of packed RBCs, 7.1 this am -He is followed by Dr. Alen Blew and last seen in December and started on by mouth iron supplements>> with plans for IV iron if the iron deficiency was persisting - GI consulted this am, LB gi to see, also heme this am  -Follow and transfuse as appropriate  2.Hypotension with hypothermia -Infection versus hypovolemia versus meds - urine cultures,  with blood, stool cultures, so far neg, no sample obtained for C. Difficile - atenolol dc'ed 1/10 -TSH, cortisol levels wnl -BPs improved with hydration and hypothermia resolved with IVF 2. HIV/AIDS Continue Atripla  -Appreciate ID input 3. Lymphadenopathy; bilateral supraclavicular  -ENT consulted for lymph node biopsy -Discussed with Dr Alen Blew and CT of chest abd and pelvis ordered 4. altered mental status  CT of the head is negative -He is mentating well this a.m.   Code Status: full Family Communication: None at bedside Disposition Plan: Keep in step down   Consultants:  Infectious disease  Procedures:  None  Antibiotics:  None  HPI/Subjective: Denies abdominal pain today. States he's been having loose stools1-2/day for about a month, but none today. He denies nausea/vomiting  Objective: Filed Vitals:   08/04/13 0815  BP: 104/68  Pulse: 85  Temp: 98.1 F (36.7 C)  Resp: 15    Intake/Output Summary (Last 24 hours) at 08/04/13 1041 Last data filed at 08/04/13 0900  Gross per 24 hour  Intake   2830 ml  Output   1600 ml  Net   1230 ml   Filed Weights   08/02/13 1705 08/02/13 1949 08/03/13 1135  Weight: 47.628 kg (105 lb) 48.3 kg (106 lb 7.7 oz) 48.3 kg (106 lb 7.7 oz)    Exam:  General: Thin appearing young male, alert & oriented x 3 In NAD Cardiovascular: RRR, nl S1  s2 Respiratory: CTAB Abdomen: soft +BS NT/ND, no masses palpable Extremities: No cyanosis and no edema    Data Reviewed: Basic Metabolic Panel:  Recent Labs Lab 08/02/13 1620 08/03/13 0710 08/04/13 0339  NA 143 145 141  K 3.8 3.8 3.6*  CL 109 112 111  CO2 23 22 20   GLUCOSE 100* 70 84  BUN 18 15 12   CREATININE 0.62 0.60 0.71  CALCIUM 8.2* 8.1* 7.5*   Liver Function Tests:  Recent Labs Lab 08/02/13 1620  AST 17  ALT 7  ALKPHOS 123*  BILITOT 0.3  PROT 6.8  ALBUMIN 1.8*    Recent Labs Lab 08/02/13 1620  LIPASE 19   No results found for this basename: AMMONIA,  in the last 168 hours CBC:  Recent Labs Lab 08/02/13 1620 08/03/13 0630 08/04/13 0339  WBC 4.6 3.2* 3.3*  NEUTROABS 1.2*  --   --   HGB 5.6* 7.5* 7.1*  HCT 19.3* 23.6* 22.5*  MCV 99.5 94.0 94.9  PLT 103* 77* 86*   Cardiac Enzymes:  Recent Labs Lab 08/03/13 0540 08/03/13 1005 08/03/13 1608  TROPONINI <0.30 <0.30 <0.30   BNP (last 3 results) No results found for this basename: PROBNP,  in the last 8760 hours CBG: No results found for this basename: GLUCAP,  in the last 168 hours  Recent Results (from the past 240 hour(s))  CULTURE, BLOOD (ROUTINE X 2)     Status: None   Collection Time    08/03/13  2:14 PM  Result Value Range Status   Specimen Description BLOOD RIGHT ARM   Final   Special Requests BOTTLES DRAWN AEROBIC AND ANAEROBIC 10CC EACH   Final   Culture  Setup Time     Final   Value: 08/03/2013 22:51     Performed at [facility]   Culture     Final   Value:        BLOOD CULTURE RECEIVED NO GROWTH TO DATE CULTURE WILL BE HELD FOR 5 DAYS BEFORE ISSUING A FINAL NEGATIVE REPORT     Performed at [facility]   Report Status PENDING   Incomplete  AFB CULTURE, BLOOD     Status: None   Collection Time    08/03/13  2:14 PM      Result Value Range Status   Specimen Description BLOOD RIGHT ARM   Final   Special Requests BOTTLES DRAWN AEROBIC ONLY 5CC   Final    Culture     Final   Value: CULTURE WILL BE EXAMINED FOR 6 WEEKS BEFORE ISSUING A FINAL REPORT     Performed at [facility]   Report Status PENDING   Incomplete     Studies: Dg Chest 2 View  08/02/2013   CLINICAL DATA:  Shortness of breath and chest pain  EXAM: CHEST  2 VIEW  COMPARISON:  06/09/2008  FINDINGS: The heart size and mediastinal contours are within normal limits. Both lungs are clear. The visualized skeletal structures are unremarkable.  IMPRESSION: No active cardiopulmonary disease.   Electronically Signed   By: Inez Catalina M.D.   On: 08/02/2013 17:20   Ct Head Wo Contrast  08/02/2013   CLINICAL DATA:  Dizziness, headache.  EXAM: CT HEAD WITHOUT CONTRAST  TECHNIQUE: Contiguous axial images were obtained from the base of the skull through the vertex without intravenous contrast.  COMPARISON:  None.  FINDINGS: Bony calvarium appears intact. No mass effect or midline shift is noted. Ventricular size is within normal limits. There is no evidence of mass lesion, hemorrhage or acute infarction.  IMPRESSION: No gross intracranial abnormality seen.   Electronically Signed   By: Sabino Dick M.D.   On: 08/02/2013 17:04    Scheduled Meds: . efavirenz-emtricitabine-tenofovir  1 tablet Oral QHS  . ferrous sulfate  325 mg Oral BID WC  . iohexol  25 mL Oral Q1 Hr x 2  . pantoprazole (PROTONIX) IV  40 mg Intravenous Q12H   Continuous Infusions: . sodium chloride 75 mL/hr at 08/02/13 1917  . sodium chloride 125 mL/hr at 08/04/13 1010    Principal Problem:   Anemia, unspecified Active Problems:   Anemia    Time spent: [facility] Pager [phone]. If 7PM-7AM, please contact night-coverage at [email or web address], password [facility] 08/04/2013, 10:41 AM  LOS: 2 days

## 2013-08-04 NOTE — Progress Notes (Signed)
While discussing all the risks, benefits and options of the approach to the patient's right supraclavicular node, the mother who apparently is the primary caretaker was on the phone. Two times the patient looked over at her to tell her to stop talking so loudly and that the doctor was discussing his issues and would she hang up the phone to participate. Through 20 minutes of discussion about the options and details about surgery versus needle biopsy, the issues with his problem, the findings on his exam, and issues with lymphoma the mother never got off the phone. After completing the patient visit with Mr. Cotten he said he wanted to think about things. I called back to the the nurse later to get Mr. Desena decision regarding needle versus surgical resection. The nurse gave her phone to the patient. Patient still had not decided and felt like he could not decide and he put his mother on the phone who wanted to know all the details that I discussed with him while she was on the phone in the room. I told her that it was unfortunate and rude that she never hung the phone up to participate in the discussion, but I proceeded to tell her all the same details that I told the patient several hours prior. It was not completely clear but it seemed like they had decided to proceed with the fine-needle aspiration in the room tomorrow. When I asked her to give the phone back to the nurse so I could order the fine-needle aspiration tray she said that it was not  her phone and she couldn't find the nurse. She then proceeded to call me' nasty'. I asked her to explain what she meant by that and what I had done besides drawing attention to the fact that she remained on the phone during a very important discussion about  her son. She had no specific reason just  repeated that I was  nasty. I feel like with the mother having such a pivotal participation in the patient's care that the doctor-patient relation which includes her is  severely compromised. I  feel like continuing his care is inappropriate and another otolaryngologist or  general surgeon should be selected.
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
## 2013-08-04 NOTE — Consult Note (Signed)
Reason for Consult: Neck mass Referring Physician: Hospitalist  Thomas [name] is an 33 y.o. male.  HPI: 33 year old with a diagnosis of HIV and has had anemia and weight loss here over a number of weeks to months. He has a concern for lymphoma by the oncologist and a biopsy of his right neck node was recommended. Patient states that this mass has been present for about one year. It has been nontender and asymptomatic. It has not enlarged. He is not aware of any other nodes that he has in other areas of his body. His anemia has fluctuated to some degree. No dysphagia or odynophagia. No hoarseness.  Past Medical History  Diagnosis Date  . HIV disease 06/25/2012  . Skin lesion 07/03/2012  . Leg pain 07/03/2012  . Sinus tachycardia 08/06/2012  . Penile cyst 08/06/2012    History reviewed. No pertinent past surgical history.  History reviewed. No pertinent family history.  Social History:  reports that he has been smoking Cigarettes.  He has a 15 pack-year smoking history. He has never used smokeless tobacco. He reports that he does not drink alcohol or use illicit drugs.  Allergies:  Allergies  Allergen Reactions  . Tylenol [Acetaminophen]     sweats    Medications: I have reviewed the patient's current medications.  Results for orders placed during the hospital encounter of 08/02/13 (from the past 48 hour(s))  CBC WITH DIFFERENTIAL     Status: Abnormal   Collection Time    08/02/13  4:20 PM      Result Value Range   WBC 4.6  4.0 - 10.5 K/uL   RBC 1.94 (*) 4.22 - 5.81 MIL/uL   Hemoglobin 5.6 (*) 13.0 - 17.0 g/dL   Comment: CRITICAL RESULT CALLED TO, READ BACK BY AND VERIFIED WITH:     PRICE,C RN 08/02/13 1713 WOOTEN,K   HCT 19.3 (*) 39.0 - 52.0 %   MCV 99.5  78.0 - 100.0 fL   MCH 28.9  26.0 - 34.0 pg   MCHC 29.0 (*) 30.0 - 36.0 g/dL   RDW 26.1 (*) 11.5 - 15.5 %   Platelets 103 (*) 150 - 400 K/uL   Comment: PLATELET COUNT CONFIRMED BY SMEAR   Neutrophils Relative % 27 (*) 43  - 77 %   Lymphocytes Relative 53 (*) 12 - 46 %   Monocytes Relative 19 (*) 3 - 12 %   Eosinophils Relative 0  0 - 5 %   Basophils Relative 1  0 - 1 %   Neutro Abs 1.2 (*) 1.7 - 7.7 K/uL   Lymphs Abs 2.5  0.7 - 4.0 K/uL   Monocytes Absolute 0.9  0.1 - 1.0 K/uL   Eosinophils Absolute 0.0  0.0 - 0.7 K/uL   Basophils Absolute 0.0  0.0 - 0.1 K/uL   RBC Morphology POLYCHROMASIA PRESENT    COMPREHENSIVE METABOLIC PANEL     Status: Abnormal   Collection Time    08/02/13  4:20 PM      Result Value Range   Sodium 143  137 - 147 mEq/L   Potassium 3.8  3.7 - 5.3 mEq/L   Chloride 109  96 - 112 mEq/L   CO2 23  19 - 32 mEq/L   Glucose, Bld 100 (*) 70 - 99 mg/dL   BUN 18  6 - 23 mg/dL   Creatinine, Ser 0.62  0.50 - 1.35 mg/dL   Calcium 8.2 (*) 8.4 - 10.5 mg/dL   Total Protein 6.8  6.0 -  8.3 g/dL   Albumin 1.8 (*) 3.5 - 5.2 g/dL   AST 17  0 - 37 U/L   ALT 7  0 - 53 U/L   Alkaline Phosphatase 123 (*) 39 - 117 U/L   Total Bilirubin 0.3  0.3 - 1.2 mg/dL   GFR calc non Af Amer >90  >90 mL/min   GFR calc Af Amer >90  >90 mL/min   Comment: (NOTE)     The eGFR has been calculated using the CKD EPI equation.     This calculation has not been validated in all clinical situations.     eGFR's persistently <90 mL/min signify possible Chronic Kidney     Disease.  LIPASE, BLOOD     Status: None   Collection Time    08/02/13  4:20 PM      Result Value Range   Lipase 19  11 - 59 U/L  TYPE AND SCREEN     Status: None   Collection Time    08/02/13  5:35 PM      Result Value Range   ABO/RH(D) O POS     Antibody Screen NEG     Sample Expiration 08/05/2013     Unit Number [id number]     Blood Component Type RED CELLS,LR     Unit division 00     Status of Unit ISSUED,FINAL     Transfusion Status OK TO TRANSFUSE     Crossmatch Result Compatible     Unit Number [id number]     Blood Component Type RED CELLS,LR     Unit division 00     Status of Unit ISSUED,FINAL     Transfusion Status OK TO  TRANSFUSE     Crossmatch Result Compatible    PREPARE RBC (CROSSMATCH)     Status: None   Collection Time    08/02/13  5:35 PM      Result Value Range   Order Confirmation ORDER PROCESSED BY BLOOD BANK    ABO/RH     Status: None   Collection Time    08/02/13  5:35 PM      Result Value Range   ABO/RH(D) O POS    URINALYSIS, ROUTINE W REFLEX MICROSCOPIC     Status: Abnormal   Collection Time    08/02/13  5:38 PM      Result Value Range   Color, Urine YELLOW  YELLOW   APPearance CLOUDY (*) CLEAR   Specific Gravity, Urine 1.015  1.005 - 1.030   pH 7.0  5.0 - 8.0   Glucose, UA NEGATIVE  NEGATIVE mg/dL   Hgb urine dipstick LARGE (*) NEGATIVE   Bilirubin Urine NEGATIVE  NEGATIVE   Ketones, ur NEGATIVE  NEGATIVE mg/dL   Protein, ur 30 (*) NEGATIVE mg/dL   Urobilinogen, UA 0.2  0.0 - 1.0 mg/dL   Nitrite NEGATIVE  NEGATIVE   Leukocytes, UA SMALL (*) NEGATIVE  URINE MICROSCOPIC-ADD ON     Status: None   Collection Time    08/02/13  5:38 PM      Result Value Range   WBC, UA 7-10  <3 WBC/hpf   RBC / HPF TOO NUMEROUS TO COUNT  <3 RBC/hpf   Urine-Other AMORPHOUS URATES/PHOSPHATES    PREPARE RBC (CROSSMATCH)     Status: None   Collection Time    08/02/13  8:30 PM      Result Value Range   Order Confirmation ORDER PROCESSED BY BLOOD BANK    PREPARE RBC (CROSSMATCH)  Status: None   Collection Time    08/03/13 12:30 AM      Result Value Range   Order Confirmation BB SAMPLE OR UNITS ALREADY AVAILABLE    TROPONIN I     Status: None   Collection Time    08/03/13  5:40 AM      Result Value Range   Troponin I <0.30  <0.30 ng/mL   Comment:            Due to the release kinetics of cTnI,     a negative result within the first hours     of the onset of symptoms does not rule out     myocardial infarction with certainty.     If myocardial infarction is still suspected,     repeat the test at appropriate intervals.  LACTIC ACID, PLASMA     Status: None   Collection Time    08/03/13   5:40 AM      Result Value Range   Lactic Acid, Venous 1.1  0.5 - 2.2 mmol/L  CBC     Status: Abnormal   Collection Time    08/03/13  6:30 AM      Result Value Range   WBC 3.2 (*) 4.0 - 10.5 K/uL   RBC 2.51 (*) 4.22 - 5.81 MIL/uL   Hemoglobin 7.5 (*) 13.0 - 17.0 g/dL   Comment: POST TRANSFUSION SPECIMEN   HCT 23.6 (*) 39.0 - 52.0 %   MCV 94.0  78.0 - 100.0 fL   MCH 29.9  26.0 - 34.0 pg   MCHC 31.8  30.0 - 36.0 g/dL   RDW 22.7 (*) 11.5 - 15.5 %   Platelets 77 (*) 150 - 400 K/uL   Comment: DELTA CHECK NOTED     REPEATED TO VERIFY     PLATELET COUNT CONFIRMED BY SMEAR  BASIC METABOLIC PANEL     Status: Abnormal   Collection Time    08/03/13  7:10 AM      Result Value Range   Sodium 145  137 - 147 mEq/L   Potassium 3.8  3.7 - 5.3 mEq/L   Chloride 112  96 - 112 mEq/L   CO2 22  19 - 32 mEq/L   Glucose, Bld 70  70 - 99 mg/dL   BUN 15  6 - 23 mg/dL   Creatinine, Ser 0.60  0.50 - 1.35 mg/dL   Calcium 8.1 (*) 8.4 - 10.5 mg/dL   GFR calc non Af Amer >90  >90 mL/min   GFR calc Af Amer >90  >90 mL/min   Comment: (NOTE)     The eGFR has been calculated using the CKD EPI equation.     This calculation has not been validated in all clinical situations.     eGFR's persistently <90 mL/min signify possible Chronic Kidney     Disease.  TROPONIN I     Status: None   Collection Time    08/03/13 10:05 AM      Result Value Range   Troponin I <0.30  <0.30 ng/mL   Comment:            Due to the release kinetics of cTnI,     a negative result within the first hours     of the onset of symptoms does not rule out     myocardial infarction with certainty.     If myocardial infarction is still suspected,     repeat the test at appropriate intervals.  CULTURE, BLOOD (ROUTINE X 2)     Status: None   Collection Time    08/03/13  2:14 PM      Result Value Range   Specimen Description BLOOD RIGHT ARM     Special Requests BOTTLES DRAWN AEROBIC AND ANAEROBIC 10CC EACH     Culture  Setup Time        Value: 08/03/2013 22:51     Performed at [facility]   Culture       Value:        BLOOD CULTURE RECEIVED NO GROWTH TO DATE CULTURE WILL BE HELD FOR 5 DAYS BEFORE ISSUING A FINAL NEGATIVE REPORT     Performed at [facility]   Report Status PENDING    AFB CULTURE, BLOOD     Status: None   Collection Time    08/03/13  2:14 PM      Result Value Range   Specimen Description BLOOD RIGHT ARM     Special Requests BOTTLES DRAWN AEROBIC ONLY 5CC     Culture       Value: CULTURE WILL BE EXAMINED FOR 6 WEEKS BEFORE ISSUING A FINAL REPORT     Performed at [facility]   Report Status PENDING    TSH     Status: None   Collection Time    08/03/13  2:14 PM      Result Value Range   TSH 3.813  0.350 - 4.500 uIU/mL   Comment: Performed at [place]     Status: None   Collection Time    08/03/13  2:14 PM      Result Value Range   Cortisol, Plasma 13.2     Comment: (NOTE)     AM:  4.3 - 22.4 ug/dL     PM:  3.1 - 16.7 ug/dL     Performed at [facility]  LIPID PANEL     Status: Abnormal   Collection Time    08/03/13  2:14 PM      Result Value Range   Cholesterol 112  0 - 200 mg/dL   Triglycerides 204 (*) <150 mg/dL   HDL 17 (*) >39 mg/dL   Total CHOL/HDL Ratio 6.6     VLDL 41 (*) 0 - 40 mg/dL   LDL Cholesterol 54  0 - 99 mg/dL   Comment:            Total Cholesterol/HDL:CHD Risk     Coronary Heart Disease Risk Table                         Men   Women      1/2 Average Risk   3.4   3.3      Average Risk       5.0   4.4      2 X Average Risk   9.6   7.1      3 X Average Risk  23.4   11.0                Use the calculated Patient Ratio     above and the CHD Risk Table     to determine the patient's CHD Risk.                ATP III CLASSIFICATION (LDL):      <100     mg/dL   Optimal      100-129  mg/dL   Near or Above                        Optimal      130-159  mg/dL   Borderline      160-189  mg/dL   High      >190     mg/dL    Very High  RPR     Status: None   Collection Time    08/03/13  2:14 PM      Result Value Range   RPR NON REACTIVE  NON REACTIVE   Comment: Performed at [facility]  TROPONIN I     Status: None   Collection Time    08/03/13  4:08 PM      Result Value Range   Troponin I <0.30  <0.30 ng/mL   Comment:            Due to the release kinetics of cTnI,     a negative result within the first hours     of the onset of symptoms does not rule out     myocardial infarction with certainty.     If myocardial infarction is still suspected,     repeat the test at appropriate intervals.  CBC     Status: Abnormal   Collection Time    08/04/13  3:39 AM      Result Value Range   WBC 3.3 (*) 4.0 - 10.5 K/uL   RBC 2.37 (*) 4.22 - 5.81 MIL/uL   Hemoglobin 7.1 (*) 13.0 - 17.0 g/dL   HCT 22.5 (*) 39.0 - 52.0 %   MCV 94.9  78.0 - 100.0 fL   MCH 30.0  26.0 - 34.0 pg   MCHC 31.6  30.0 - 36.0 g/dL   RDW 24.0 (*) 11.5 - 15.5 %   Platelets 86 (*) 150 - 400 K/uL   Comment: CONSISTENT WITH PREVIOUS RESULT  BASIC METABOLIC PANEL     Status: Abnormal   Collection Time    08/04/13  3:39 AM      Result Value Range   Sodium 141  137 - 147 mEq/L   Potassium 3.6 (*) 3.7 - 5.3 mEq/L   Chloride 111  96 - 112 mEq/L   CO2 20  19 - 32 mEq/L   Glucose, Bld 84  70 - 99 mg/dL   BUN 12  6 - 23 mg/dL   Creatinine, Ser 0.71  0.50 - 1.35 mg/dL   Calcium 7.5 (*) 8.4 - 10.5 mg/dL   GFR calc non Af Amer >90  >90 mL/min   GFR calc Af Amer >90  >90 mL/min   Comment: (NOTE)     The eGFR has been calculated using the CKD EPI equation.     This calculation has not been validated in all clinical situations.     eGFR's persistently <90 mL/min signify possible Chronic Kidney     Disease.    Dg Chest 2 View  08/02/2013   CLINICAL DATA:  Shortness of breath and chest pain  EXAM: CHEST  2 VIEW  COMPARISON:  06/09/2008  FINDINGS: The heart size and mediastinal contours are within normal limits. Both lungs are clear.  The visualized skeletal structures are unremarkable.  IMPRESSION: No active cardiopulmonary disease.   Electronically Signed   By: Inez Catalina M.D.   On: 08/02/2013 17:20   Ct Head Wo Contrast  08/02/2013   CLINICAL DATA:  Dizziness, headache.  EXAM:  CT HEAD WITHOUT CONTRAST  TECHNIQUE: Contiguous axial images were obtained from the base of the skull through the vertex without intravenous contrast.  COMPARISON:  None.  FINDINGS: Bony calvarium appears intact. No mass effect or midline shift is noted. Ventricular size is within normal limits. There is no evidence of mass lesion, hemorrhage or acute infarction.  IMPRESSION: No gross intracranial abnormality seen.   Electronically Signed   By: Sabino Dick M.D.   On: 08/02/2013 17:04    ROS Blood pressure 111/75, pulse 95, temperature 97.4 F (36.3 C), temperature source Oral, resp. rate 19, height _0  (1.854 m), weight 48.3 kg (106 lb 7.7 oz), SpO2 100.00%. Physical Exam  HENT:  Very cachectic, nose is clear. Oral cavity/oropharynx-no lesions. Neck-there is a few shotty nodes in the left neck in the supraclavicular area. The right side has a 2 cm node is mobile smooth and nontender. No other masses noted.  Eyes: Conjunctivae are normal. Pupils are equal, round, and reactive to light.    Assessment/Plan: Right supraclavicular node-this is a single nontender node that has been the same size for over a year. I would recommend a fine-needle aspiration of this first to make sure that is not related to some other epithelial or sarcoid-type tumor. If that is then negative then a biopsy of the node for lymphoma can be performed. Another option is to perform a core biopsy under ultrasound guidance which might be enough tissue to diagnose all of the potential diagnosis. I talked to the patient about the options and he is going to talk it over with his mother and decide whether he wants to proceed with the needle biopsy. That can be done this afternoon if the  equipment is obtained from pathology. If a ultrasound or CT guidance is decided upon then that can be done in the hospital by radiology.  Melissa Montane 08/04/2013, 12:20 PM

## 2013-08-04 NOTE — Progress Notes (Signed)
INITIAL NUTRITION ASSESSMENT  DOCUMENTATION CODES Per approved criteria  -Severe malnutrition in the context of chronic illness -Underweight   INTERVENTION: 1. Ensure complete PO three times daily, each 8 oz bottle provides 350 kcal and 13 grams protein  2. MD: please consider alternative appetite stimulant per pt and family request.  3. RD to continue to follow nutrition care plan.  NUTRITION DIAGNOSIS: Malnutrition related to chronic illness/HIV as evidenced by severe weight loss and fat/muscle wasting.   Goal: Pt meal completion >/= 90%   Monitor:  Weight trends, labs, oral intake of meals and supplements  Reason for Assessment: Pt identified for nutritional risk by malnutrition screening tool  33 y.o. male  Admitting Dx: Anemia, unspecified  ASSESSMENT: Pt has past medical hx significant for HIV, diagnosed in October 2013. Presented to hospital with altered mental status, anxiety, sweating and shaking. The patient has been having extreme weight loss for the last couple of weeks with frequent black stools at around 1-2 times per day. Work up reveals pt with anemia and neck mass.   Pt reports extreme weight loss since diagnosis of HIV. Pt reports fatigue and weakness. Pt appetite at home has been good with consumption of high calorie/high fat foods (pig feet, chicken wings, fried foods) with at home meal completions >90%, but reports that gaining weight has been unsuccessful. Pt reports taking ensure with his three meals a day. Meal completion while admitted in hospital has been 100%. Pt is willing to take ensure with his meals three times a day. Pt with obvious severe muscle and fat mass loss.   Pt meets criteria for severe malnutrition in the context of chronic illness secondary to severe muscle and fat mass loss and 24% weight loss in a year.  Height: Ht Readings from Last 1 Encounters:  08/03/13 6\' 1"  (1.854 m)    Weight: Wt Readings from Last 1 Encounters:  08/03/13  106 lb 7.7 oz (48.3 kg)    Ideal Body Weight: 184 lbs (83.6 kg)  % Ideal Body Weight: 58%  Wt Readings from Last 10 Encounters:  08/03/13 106 lb 7.7 oz (48.3 kg)  07/08/13 120 lb (54.432 kg)  07/02/13 122 lb (55.339 kg)  06/24/13 121 lb (54.885 kg)  10/10/12 135 lb (61.236 kg)  10/07/12 136 lb 1.9 oz (61.744 kg)  08/23/12 139 lb (63.05 kg)  08/06/12 142 lb (64.411 kg)  07/03/12 143 lb (64.864 kg)  06/25/12 144 lb (65.318 kg)    Usual Body Weight: 135-145 lb  % Usual Body Weight: 76%   BMI:  Body mass index is 14.05 kg/(m^2). Underweight  Estimated Nutritional Needs: Kcal: 1900-2200 kcals/day Protein: 72-85 grams/day Fluid: >1.5L/day  Skin: No issues noted  Diet Order: General Pt meal completion is 100%  EDUCATION NEEDS: -No education needs identified at this time   Intake/Output Summary (Last 24 hours) at 08/04/13 1514 Last data filed at 08/04/13 1300  Gross per 24 hour  Intake   4480 ml  Output   2525 ml  Net   1955 ml    Last BM: 1/11   Labs:   Recent Labs Lab 08/02/13 1620 08/03/13 0710 08/04/13 0339  NA 143 145 141  K 3.8 3.8 3.6*  CL 109 112 111  CO2 23 22 20   BUN 18 15 12   CREATININE 0.62 0.60 0.71  CALCIUM 8.2* 8.1* 7.5*  GLUCOSE 100* 70 84    CBG (last 3)  No results found for this basename: GLUCAP,  in the last 72  hours  Scheduled Meds: . efavirenz-emtricitabine-tenofovir  1 tablet Oral QHS  . ferrous sulfate  325 mg Oral BID WC  . pantoprazole (PROTONIX) IV  40 mg Intravenous Q12H    Continuous Infusions: . sodium chloride 75 mL/hr at 08/02/13 1917  . sodium chloride 125 mL/hr at 08/04/13 1010    Past Medical History  Diagnosis Date  . HIV disease 06/25/2012  . Skin lesion 07/03/2012  . Leg pain 07/03/2012  . Sinus tachycardia 08/06/2012  . Penile cyst 08/06/2012    Past Surgical History  Procedure Laterality Date  . 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   this after several previous I and Ds of rectal abcess.     Kallie Locks Dietetic Student Pager: [phone]  I agree with the above information and made appropriate revisions. Inda Coke MS, RD, LDN Pager: [phone] After-hours pager: [phone]

## 2013-08-05 ENCOUNTER — Other Ambulatory Visit: Payer: Medicare Other

## 2013-08-05 DIAGNOSIS — R64 Cachexia: Secondary | ICD-10-CM

## 2013-08-05 DIAGNOSIS — Z21 Asymptomatic human immunodeficiency virus [HIV] infection status: Secondary | ICD-10-CM

## 2013-08-05 LAB — HIV-1 RNA ULTRAQUANT REFLEX TO GENTYP+
HIV 1 RNA Quant: 289 copies/mL — ABNORMAL HIGH (ref <20)
HIV-1 RNA QUANT, LOG: 2.46 {Log} — AB (ref <1.30)

## 2013-08-05 LAB — URINE CULTURE

## 2013-08-05 LAB — CLOSTRIDIUM DIFFICILE BY PCR: Toxigenic C. Difficile by PCR: NEGATIVE

## 2013-08-05 LAB — CYTOMEGALOVIRUS PCR, QUALITATIVE: Cytomegalovirus DNA: NOT DETECTED

## 2013-08-05 MED ORDER — PANTOPRAZOLE SODIUM 40 MG PO TBEC
40.0000 mg | DELAYED_RELEASE_TABLET | Freq: Two times a day (BID) | ORAL | Status: DC
  Administered 2013-08-05 – 2013-08-11 (×11): 40 mg via ORAL
  Filled 2013-08-05 (×10): qty 1

## 2013-08-05 NOTE — Progress Notes (Signed)
HR maintaining in the low 120s, BP 113/80. Forrest Moron notified. New order received. Will continue to monitor.   Toney Sang A RN

## 2013-08-05 NOTE — Progress Notes (Signed)
Pt and pt's mother requested to walk around the hospital (pt in wheelchair). I told them they needed to stay on the unit for safety and observation purposes. They agreed. Pt and his mother left the unit (I was notified by the unit secretary). I went to the main entrance and the ED and finally found the pt and his mother by the gift shop. I asked them to please come back to the unit with me and they agreed. They verbalized understanding that they needed to remain on the unit. Pt is eating his supper now, vitals stable, no pain or distress observed or stated. Will continue to monitor.  - Soyla Dryer, RN

## 2013-08-05 NOTE — Progress Notes (Signed)
[facility]    Date of Admission:  08/02/2013   Total days of antibiotics 0           ID: Thomas [name] is a 33 y.o. male with HIV, CD 4 count of 660/VL 289 (previously 23 copies) on atripla present with weight loss anemia, and LAD concern for malignancy vs. Other infectious causes such as MAI Principal Problem:   Anemia, unspecified Active Problems:   Anemia   Iron deficiency anemia, unspecified   Protein-calorie malnutrition, severe    Subjective: Afebrile. Being evaluated for surgical biopsy of supraclavicular LN  Medications:  . efavirenz-emtricitabine-tenofovir  1 tablet Oral QHS  . feeding supplement (ENSURE COMPLETE)  237 mL Oral TID WC  . ferrous sulfate  325 mg Oral BID WC  . pantoprazole  40 mg Oral BID AC    Objective: Vital signs in last 24 hours: Temp:  [97.3 F (36.3 C)-97.6 F (36.4 C)] 97.5 F (36.4 C) (01/13 0726) Resp:  [14-17] 15 (01/13 0726) BP: (90-112)/(52-70) 109/68 mmHg (01/13 0726) SpO2:  [98 %-100 %] 98 % (01/13 0726)  Physical Exam  Constitutional: He is oriented to person, place, and time. He appears cachetic. No distress. cachetic HENT:  Mouth/Throat: Oropharynx is clear and moist. No oropharyngeal exudate.  Cardiovascular: Normal rate, regular rhythm and normal heart sounds. Exam reveals no gallop and no friction rub.  No murmur heard.  Pulmonary/Chest: Effort normal and breath sounds normal. No respiratory distress. He has no wheezes.  Abdominal: Soft. Bowel sounds are normal. He exhibits no distension. There is no tenderness.  Lymphadenopathy: right supraclavicular LN, and shotty left sided cervical adenopathy.  Neurological: He is alert and oriented to person, place, and time.  Skin: Skin is warm and dry. No rash noted. No erythema.  Psychiatric: He has a normal mood and affect. His behavior is normal.    Lab Results  Recent Labs  08/03/13 0630 08/03/13 0710 08/04/13 0339  WBC 3.2*  --  3.3*    HGB 7.5*  --  7.1*  HCT 23.6*  --  22.5*  NA  --  145 141  K  --  3.8 3.6*  CL  --  112 111  CO2  --  22 20  BUN  --  15 12  CREATININE  --  0.60 0.71   Liver Panel  Recent Labs  08/02/13 1620  PROT 6.8  ALBUMIN 1.8*  AST 17  ALT 7  ALKPHOS 123*  BILITOT 0.3   Microbiology: 1/11 blood cx NGTD 1/11 ur cx 100,000 CoNS, but patient asymptomatic 1/11 stool cx pending 1/12 cdiff negative Studies/Results: Ct Chest W Contrast  08/04/2013   CLINICAL DATA:  Anemia, fever and weight loss. Evaluate for potential lymphoma. HIV-positive patient.  EXAM: CT CHEST, ABDOMEN AND PELVIS WITHOUT CONTRAST  TECHNIQUE: Multidetector CT imaging of the chest, abdomen and pelvis was performed following the standard protocol without IV contrast.  COMPARISON:  No priors.  FINDINGS: CT CHEST FINDINGS  Mediastinum: Numerous borderline enlarged and mildly enlarged mediastinal lymph nodes are noted, largest of which include a 12 mm low left paratracheal lymph node and a 11 mm lymph node in the superior mediastinum immediately to the right of the innominate artery. Enlarged retrocrural and posterior mediastinal lymph nodes measuring up to 15 mm in short axis. Heart size is normal. Small amount of pericardial fluid and/or thickening, unlikely to be of hemodynamic significance at this time. No associated pericardial calcification. Esophagus is unremarkable  in appearance.  Lungs/Pleura: No acute consolidative airspace disease. No pleural effusions. No suspicious appearing pulmonary nodules or masses.  Musculoskeletal: There are no aggressive appearing lytic or blastic lesions noted in the visualized portions of the skeleton.  CT ABDOMEN AND PELVIS FINDINGS  Abdomen/Pelvis: The spleen is massively enlarged measuring 14.3 x 7.5 x 24.0 cm (estimated splenic volume of 1,287 mL), and the spleen is diffusely heterogeneous in attenuation/enhancement, suggesting diffuse infiltration by a innumerable lesions, presumably  lymphomatous. There is extensive lymphadenopathy throughout the retroperitoneum and upper abdomen. Numerous enlarged lymph nodes in the celiac axis are noted, best demonstrated on image 58 of series 2 retroperitoneal lymph nodes measuring up to 21 mm in short axis are noted in the left para-aortic station anterior to the left renal hilum. This retroperitoneal lymphadenopathy extends to the pelvis where there are also bilateral external iliac lymph nodes (left greater than right), largest of which on the left side measures up to 15 mm.  The appearance of the liver, gallbladder, pancreas and bilateral adrenal glands is unremarkable. There are numerous nonobstructive calculi within the collecting systems of the kidneys bilaterally, largest of which measures 11 mm in the upper pole collecting system of the right kidney. In addition, there appear to be 2 calculi in the proximal right ureter just beyond the right ureteropelvic junction, largest of which measures 1 cm. Mild fullness in the renal collecting systems bilaterally, but no frank hydroureteronephrosis or perinephric stranding to suggest significant urinary tract obstruction at this time. No bladder calculi identified.  Trace volume of ascites in the low anatomic pelvis. No pneumoperitoneum. No pathologic distention of small bowel. Prostate gland and urinary bladder are unremarkable in appearance.  Musculoskeletal: There are no aggressive appearing lytic or blastic lesions noted in the visualized portions of the skeleton.  IMPRESSION: 1. Extensive lymphadenopathy in the chest, abdomen and pelvis, with massive splenomegaly, as detailed above, highly concerning for lymphoma. 2. Numerous nonobstructive calculi within the collecting systems of the kidneys bilaterally, largest of which on the right side measure up to 11 mm. In addition, there are 2 calculi in the proximal right ureter just distal to the right ureteropelvic junction, largest of which measures up to 1  cm. Despite this, there does not appear to be significant urinary tract obstruction at this time. 3. Trace volume of ascites. 4. Small amount of pericardial fluid and/or thickening, unlikely to be of hemodynamic significance at this time.   Electronically Signed   By: Vinnie Langton M.D.   On: 08/04/2013 14:42   Ct Abdomen Pelvis W Contrast  08/04/2013   CLINICAL DATA:  Anemia, fever and weight loss. Evaluate for potential lymphoma. HIV-positive patient.  EXAM: CT CHEST, ABDOMEN AND PELVIS WITHOUT CONTRAST  TECHNIQUE: Multidetector CT imaging of the chest, abdomen and pelvis was performed following the standard protocol without IV contrast.  COMPARISON:  No priors.  FINDINGS: CT CHEST FINDINGS  Mediastinum: Numerous borderline enlarged and mildly enlarged mediastinal lymph nodes are noted, largest of which include a 12 mm low left paratracheal lymph node and a 11 mm lymph node in the superior mediastinum immediately to the right of the innominate artery. Enlarged retrocrural and posterior mediastinal lymph nodes measuring up to 15 mm in short axis. Heart size is normal. Small amount of pericardial fluid and/or thickening, unlikely to be of hemodynamic significance at this time. No associated pericardial calcification. Esophagus is unremarkable in appearance.  Lungs/Pleura: No acute consolidative airspace disease. No pleural effusions. No suspicious appearing pulmonary nodules or  masses.  Musculoskeletal: There are no aggressive appearing lytic or blastic lesions noted in the visualized portions of the skeleton.  CT ABDOMEN AND PELVIS FINDINGS  Abdomen/Pelvis: The spleen is massively enlarged measuring 14.3 x 7.5 x 24.0 cm (estimated splenic volume of 1,287 mL), and the spleen is diffusely heterogeneous in attenuation/enhancement, suggesting diffuse infiltration by a innumerable lesions, presumably lymphomatous. There is extensive lymphadenopathy throughout the retroperitoneum and upper abdomen. Numerous  enlarged lymph nodes in the celiac axis are noted, best demonstrated on image 58 of series 2 retroperitoneal lymph nodes measuring up to 21 mm in short axis are noted in the left para-aortic station anterior to the left renal hilum. This retroperitoneal lymphadenopathy extends to the pelvis where there are also bilateral external iliac lymph nodes (left greater than right), largest of which on the left side measures up to 15 mm.  The appearance of the liver, gallbladder, pancreas and bilateral adrenal glands is unremarkable. There are numerous nonobstructive calculi within the collecting systems of the kidneys bilaterally, largest of which measures 11 mm in the upper pole collecting system of the right kidney. In addition, there appear to be 2 calculi in the proximal right ureter just beyond the right ureteropelvic junction, largest of which measures 1 cm. Mild fullness in the renal collecting systems bilaterally, but no frank hydroureteronephrosis or perinephric stranding to suggest significant urinary tract obstruction at this time. No bladder calculi identified.  Trace volume of ascites in the low anatomic pelvis. No pneumoperitoneum. No pathologic distention of small bowel. Prostate gland and urinary bladder are unremarkable in appearance.  Musculoskeletal: There are no aggressive appearing lytic or blastic lesions noted in the visualized portions of the skeleton.  IMPRESSION: 1. Extensive lymphadenopathy in the chest, abdomen and pelvis, with massive splenomegaly, as detailed above, highly concerning for lymphoma. 2. Numerous nonobstructive calculi within the collecting systems of the kidneys bilaterally, largest of which on the right side measure up to 11 mm. In addition, there are 2 calculi in the proximal right ureter just distal to the right ureteropelvic junction, largest of which measures up to 1 cm. Despite this, there does not appear to be significant urinary tract obstruction at this time. 3. Trace  volume of ascites. 4. Small amount of pericardial fluid and/or thickening, unlikely to be of hemodynamic significance at this time.   Electronically Signed   By: Vinnie Langton M.D.   On: 08/04/2013 14:42     Assessment/Plan: Lymphadenopathy = concur with primary team to do biopsy in order to determine if patient may have malignancy. Recommend to send some tissue for aerobic, fungal, and AFB culture.  hiv = continue with atripla. Unclear if patient had a break in his therapy to account for detectable viral load vs. 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 CD 4 count 660, no risk for OI.  Baxter Flattery [facility] for Infectious Diseases Cell: [phone] Pager: [phone]  08/05/2013, 11:36 AM

## 2013-08-05 NOTE — Progress Notes (Signed)
Daily Rounding Note  08/05/2013, 9:03 AM  LOS: 3 days   SUBJECTIVE:       Another soft, formed, greenish dark brown stool last PM.  No heartburn or nausea.  No abdominal pain.  Appetite better.  OBJECTIVE:         Vital signs in last 24 hours:    Temp:  [97.3 F (36.3 C)-97.6 F (36.4 C)] 97.5 F (36.4 C) (01/13 0726) Pulse Rate:  [95] 95 (01/12 1107) Resp:  [14-19] 15 (01/13 0726) BP: (90-112)/(52-75) 109/68 mmHg (01/13 0726) SpO2:  [98 %-100 %] 98 % (01/13 0726) Last BM Date: 08/04/13 General: looks cachectic but not acutely ill.    Heart: RRR Chest: clear bil Abdomen: soft, NT, ND active BS  Extremities: non-pitting pedal edema Neuro/Psych:  Pleasant, no confusion. No gross deficits.   Intake/Output from previous day: 01/12 0701 - 01/13 0700 In: 5848.3 [P.O.:840; I.V.:3008.3] Out: 4075 [Urine:4075]  Intake/Output this shift: Total I/O In: -  Out: 950 [Urine:950]  Lab Results:  Recent Labs  08/02/13 1620 08/03/13 0630 08/04/13 0339  WBC 4.6 3.2* 3.3*  HGB 5.6* 7.5* 7.1*  HCT 19.3* 23.6* 22.5*  PLT 103* 77* 86*   BMET  Recent Labs  08/02/13 1620 08/03/13 0710 08/04/13 0339  NA 143 145 141  K 3.8 3.8 3.6*  CL 109 112 111  CO2 23 22 20   GLUCOSE 100* 70 84  BUN 18 15 12   CREATININE 0.62 0.60 0.71  CALCIUM 8.2* 8.1* 7.5*   LFT  Recent Labs  08/02/13 1620  PROT 6.8  ALBUMIN 1.8*  AST 17  ALT 7  ALKPHOS 123*  BILITOT 0.3   C diff negative FOBT negative.    Studies/Results: Ct Chest W Contrast Ct Abdomen Pelvis W Contrast 08/04/2013    FINDINGS: CT CHEST FINDINGS  Mediastinum: Numerous borderline enlarged and mildly enlarged mediastinal lymph nodes are noted, largest of which include a 12 mm low left paratracheal lymph node and a 11 mm lymph node in the superior mediastinum immediately to the right of the innominate artery. Enlarged retrocrural and posterior mediastinal lymph  nodes measuring up to 15 mm in short axis. Heart size is normal. Small amount of pericardial fluid and/or thickening, unlikely to be of hemodynamic significance at this time. No associated pericardial calcification. Esophagus is unremarkable in appearance.  Lungs/Pleura: No acute consolidative airspace disease. No pleural effusions. No suspicious appearing pulmonary nodules or masses.  Musculoskeletal: There are no aggressive appearing lytic or blastic lesions noted in the visualized portions of the skeleton.  CT ABDOMEN AND PELVIS FINDINGS  Abdomen/Pelvis: The spleen is massively enlarged measuring 14.3 x 7.5 x 24.0 cm (estimated splenic volume of 1,287 mL), and the spleen is diffusely heterogeneous in attenuation/enhancement, suggesting diffuse infiltration by a innumerable lesions, presumably lymphomatous. There is extensive lymphadenopathy throughout the retroperitoneum and upper abdomen. Numerous enlarged lymph nodes in the celiac axis are noted, best demonstrated on image 58 of series 2 retroperitoneal lymph nodes measuring up to 21 mm in short axis are noted in the left para-aortic station anterior to the left renal hilum. This retroperitoneal lymphadenopathy extends to the pelvis where there are also bilateral external iliac lymph nodes (left greater than right), largest of which on the left side measures up to 15 mm.  The appearance of the liver, gallbladder, pancreas and bilateral adrenal glands is unremarkable. There are numerous nonobstructive calculi within the collecting systems of the kidneys bilaterally, largest of  which measures 11 mm in the upper pole collecting system of the right kidney. In addition, there appear to be 2 calculi in the proximal right ureter just beyond the right ureteropelvic junction, largest of which measures 1 cm. Mild fullness in the renal collecting systems bilaterally, but no frank hydroureteronephrosis or perinephric stranding to suggest significant urinary tract  obstruction at this time. No bladder calculi identified.  Trace volume of ascites in the low anatomic pelvis. No pneumoperitoneum. No pathologic distention of small bowel. Prostate gland and urinary bladder are unremarkable in appearance.  Musculoskeletal: There are no aggressive appearing lytic or blastic lesions noted in the visualized portions of the skeleton.  IMPRESSION: 1. Extensive lymphadenopathy in the chest, abdomen and pelvis, with massive splenomegaly, as detailed above, highly concerning for lymphoma. 2. Numerous nonobstructive calculi within the collecting systems of the kidneys bilaterally, largest of which on the right side measure up to 11 mm. In addition, there are 2 calculi in the proximal right ureter just distal to the right ureteropelvic junction, largest of which measures up to 1 cm. Despite this, there does not appear to be significant urinary tract obstruction at this time. 3. Trace volume of ascites. 4. Small amount of pericardial fluid and/or thickening, unlikely to be of hemodynamic significance at this time.   Electronically Signed   By: Vinnie Langton M.D.   On: 08/04/2013 14:42    ASSESMENT:   * Chronic anemia. Slightly worse at admission, despite compliance with po Iron.  * Loose stools, dark but not melenic or bloody. Resolved. Had formed stool as of 1/13.  FOBT negative 08/04/13  Not convinced of a gi source for the anemia.  * Weight loss. Stopped taking Megace after Christmas, when it started causing nausea.  *  Lymphadenopathy, massive splenomegaly, trace ascites: concerning for lymphoma per CT.  Need lymph node biopsy per Dr Hazeline Junker note.   * HIV disease, compliant with Atripla. CD 4 count is 660, c/w low of 370 6 weeks ago. General downward trend over last 6 months. HIV RNA count overall reduced c/w one year ago.  *  Nephrolithiasis, non-obstructing.  * S/p drainage of peri rectal abcess and rectal fisurotomy 08/2010.    PLAN   *  No plans for EGD or  colonoscopy with the FOBT being negative.  Will sign off.  *  D/c contact precautions.    Azucena Freed  08/05/2013, 9:03 AM Pager: [phone]     Attending physician's note   I have taken an interval history, reviewed the chart and examined the patient. I agree with the Advanced Practitioner's note, impression and recommendations. FOBT negative. Continue Fe replacement. No plans for EGD or colonoscopy at this time. Evaluation of lymphadenopathy and massive splenomegaly is in progress. GI signing off.  Pricilla Riffle. Fuller Plan, MD [facility]

## 2013-08-05 NOTE — Progress Notes (Signed)
[facility] PROGRESS NOTE  Thomas [name] [id number] DOB: Sep 24, 1980 DOA: 08/02/2013 PCP: Philis Fendt, MD  Assessment/Plan: 1. Anemia, symptomatic  -Hemoglobin 7.5 status post Transfusion of 2 units of packed RBCs, 7.1 this am -He is followed by Dr. Alen Blew and last seen in December and started on by mouth iron supplements>> with plans for IV iron if the iron deficiency was persisting - Appreciate GI assistance, patient Hemoccult negative -Per GI no endoscopy indicated at this time -heme following and recommend lymph node bx 2.Hypotension with hypothermia -Infection versus hypovolemia versus meds - urine cultures,  with blood, stool cultures, so far neg,  -Still negative for C. difficile - atenolol dc'ed 1/10 -TSH, cortisol levels wnl -BPs improved with hydration and hypothermia resolved with IVF 2. HIV/AIDS Continue Atripla  -Appreciate ID input 3. Extensive Lymphadenopathy and massive splenomegaly -CT scan of abdomen pelvis and chest with extensive lymphadenopathy in chest abdomen and pelvis and massive splenomegaly -ENT consulted for lymph node biopsy initially and Dr. Janace Hoard saw patient>> please refer to his note of 1/12 he is not able to proceed with bx -I have consulted thoracic surgery today>> Dr. Cyndia Bent to see patient for possible supraclavicular bx to eval lymphoma 4. altered mental status  CT of the head is negative -He is mentating well    Code Status: full Family Communication: mother at bedside Disposition Plan: transfer to tele   Consultants:  Infectious disease  Thoracic surgery  Procedures:  None  Antibiotics:  None  HPI/Subjective: States 1 stool so far today more formed, denies abdominal pain Objective: Filed Vitals:   08/05/13 1500  BP:   Pulse:   Temp: 97.6 F (36.4 C)  Resp:     Intake/Output Summary (Last 24 hours) at 08/05/13 1840 Last data filed at 08/05/13 1800  Gross per 24 hour  Intake 9522.08 ml  Output   4200  ml  Net 5322.08 ml   Filed Weights   08/02/13 1705 08/02/13 1949 08/03/13 1135  Weight: 47.628 kg (105 lb) 48.3 kg (106 lb 7.7 oz) 48.3 kg (106 lb 7.7 oz)    Exam:  General: Thin appearing young male, alert & oriented x 3 In NAD Neck:supraclavicular lymph nodes present bilaterally Cardiovascular: RRR, nl S1 s2 Respiratory: CTAB Abdomen: soft +BS NT/ND,  Extremities: No cyanosis and no edema    Data Reviewed: Basic Metabolic Panel:  Recent Labs Lab 08/02/13 1620 08/03/13 0710 08/04/13 0339  NA 143 145 141  K 3.8 3.8 3.6*  CL 109 112 111  CO2 23 22 20   GLUCOSE 100* 70 84  BUN 18 15 12   CREATININE 0.62 0.60 0.71  CALCIUM 8.2* 8.1* 7.5*   Liver Function Tests:  Recent Labs Lab 08/02/13 1620  AST 17  ALT 7  ALKPHOS 123*  BILITOT 0.3  PROT 6.8  ALBUMIN 1.8*    Recent Labs Lab 08/02/13 1620  LIPASE 19   No results found for this basename: AMMONIA,  in the last 168 hours CBC:  Recent Labs Lab 08/02/13 1620 08/03/13 0630 08/04/13 0339  WBC 4.6 3.2* 3.3*  NEUTROABS 1.2*  --   --   HGB 5.6* 7.5* 7.1*  HCT 19.3* 23.6* 22.5*  MCV 99.5 94.0 94.9  PLT 103* 77* 86*   Cardiac Enzymes:  Recent Labs Lab 08/03/13 0540 08/03/13 1005 08/03/13 1608  TROPONINI <0.30 <0.30 <0.30   BNP (last 3 results) No results found for this basename: PROBNP,  in the last 8760 hours CBG: No results found for this basename:  GLUCAP,  in the last 168 hours  Recent Results (from the past 240 hour(s))  CMV CULTURE CMVC     Status: None   Collection Time    08/02/13  9:12 PM      Result Value Range Status   Specimen Description NASAL SWAB   Final   Special Requests NONE   Final   Culture     Final   Value: Culture has been initiated.     Performed at [facility]   Report Status PENDING   Incomplete  GC/CHLAMYDIA PROBE AMP     Status: None   Collection Time    08/03/13  2:04 PM      Result Value Range Status   CT Probe RNA NEGATIVE  NEGATIVE Final   GC  Probe RNA NEGATIVE  NEGATIVE Final   Comment: (NOTE)                                                                                               **Normal Reference Range: Negative**          Assay performed using the Gen-Probe APTIMA COMBO2 (R) Assay.     Acceptable specimen types for this assay include APTIMA Swabs (Unisex,     endocervical, urethral, or vaginal), first void urine, and ThinPrep     liquid based cytology samples.     Performed at [facility]     Status: None   Collection Time    08/03/13  2:04 PM      Result Value Range Status   Specimen Description URINE, RANDOM   Final   Special Requests NONE   Final   Culture  Setup Time     Final   Value: 08/03/2013 18:59     Performed at [facility]     Final   Value: >=100,000 COLONIES/ML     Performed at [facility]   Culture     Final   Value: STAPHYLOCOCCUS SPECIES (COAGULASE NEGATIVE)     Note: RIFAMPIN AND GENTAMICIN SHOULD NOT BE USED AS SINGLE DRUGS FOR TREATMENT OF STAPH INFECTIONS.     Performed at [facility]   Report Status 08/05/2013 FINAL   Final   Organism ID, Bacteria STAPHYLOCOCCUS SPECIES (COAGULASE NEGATIVE)   Final  CULTURE, BLOOD (ROUTINE X 2)     Status: None   Collection Time    08/03/13  2:14 PM      Result Value Range Status   Specimen Description BLOOD RIGHT ARM   Final   Special Requests BOTTLES DRAWN AEROBIC AND ANAEROBIC 10CC EACH   Final   Culture  Setup Time     Final   Value: 08/03/2013 22:51     Performed at [facility]   Culture     Final   Value:        BLOOD CULTURE RECEIVED NO GROWTH TO DATE CULTURE WILL BE HELD FOR 5 DAYS BEFORE ISSUING A FINAL NEGATIVE REPORT     Performed at [facility]   Report Status PENDING  Incomplete  AFB CULTURE, BLOOD     Status: None   Collection Time    08/03/13  2:14 PM      Result Value Range Status   Specimen Description BLOOD RIGHT ARM   Final   Special Requests  BOTTLES DRAWN AEROBIC ONLY 5CC   Final   Culture     Final   Value: CULTURE WILL BE EXAMINED FOR 6 WEEKS BEFORE ISSUING A FINAL REPORT     Performed at [facility]   Report Status PENDING   Incomplete  STOOL CULTURE     Status: None   Collection Time    08/03/13 10:48 PM      Result Value Range Status   Specimen Description STOOL   Final   Special Requests NONE   Final   Culture     Final   Value: Culture reincubated for better growth     Performed at [facility]   Report Status PENDING   Incomplete  CLOSTRIDIUM DIFFICILE BY PCR     Status: None   Collection Time    08/04/13 10:07 PM      Result Value Range Status   C difficile by pcr NEGATIVE  NEGATIVE Final     Studies: Ct Chest W Contrast  08/04/2013   CLINICAL DATA:  Anemia, fever and weight loss. Evaluate for potential lymphoma. HIV-positive patient.  EXAM: CT CHEST, ABDOMEN AND PELVIS WITHOUT CONTRAST  TECHNIQUE: Multidetector CT imaging of the chest, abdomen and pelvis was performed following the standard protocol without IV contrast.  COMPARISON:  No priors.  FINDINGS: CT CHEST FINDINGS  Mediastinum: Numerous borderline enlarged and mildly enlarged mediastinal lymph nodes are noted, largest of which include a 12 mm low left paratracheal lymph node and a 11 mm lymph node in the superior mediastinum immediately to the right of the innominate artery. Enlarged retrocrural and posterior mediastinal lymph nodes measuring up to 15 mm in short axis. Heart size is normal. Small amount of pericardial fluid and/or thickening, unlikely to be of hemodynamic significance at this time. No associated pericardial calcification. Esophagus is unremarkable in appearance.  Lungs/Pleura: No acute consolidative airspace disease. No pleural effusions. No suspicious appearing pulmonary nodules or masses.  Musculoskeletal: There are no aggressive appearing lytic or blastic lesions noted in the visualized portions of the skeleton.  CT ABDOMEN  AND PELVIS FINDINGS  Abdomen/Pelvis: The spleen is massively enlarged measuring 14.3 x 7.5 x 24.0 cm (estimated splenic volume of 1,287 mL), and the spleen is diffusely heterogeneous in attenuation/enhancement, suggesting diffuse infiltration by a innumerable lesions, presumably lymphomatous. There is extensive lymphadenopathy throughout the retroperitoneum and upper abdomen. Numerous enlarged lymph nodes in the celiac axis are noted, best demonstrated on image 58 of series 2 retroperitoneal lymph nodes measuring up to 21 mm in short axis are noted in the left para-aortic station anterior to the left renal hilum. This retroperitoneal lymphadenopathy extends to the pelvis where there are also bilateral external iliac lymph nodes (left greater than right), largest of which on the left side measures up to 15 mm.  The appearance of the liver, gallbladder, pancreas and bilateral adrenal glands is unremarkable. There are numerous nonobstructive calculi within the collecting systems of the kidneys bilaterally, largest of which measures 11 mm in the upper pole collecting system of the right kidney. In addition, there appear to be 2 calculi in the proximal right ureter just beyond the right ureteropelvic junction, largest of which measures 1 cm. Mild fullness in the  renal collecting systems bilaterally, but no frank hydroureteronephrosis or perinephric stranding to suggest significant urinary tract obstruction at this time. No bladder calculi identified.  Trace volume of ascites in the low anatomic pelvis. No pneumoperitoneum. No pathologic distention of small bowel. Prostate gland and urinary bladder are unremarkable in appearance.  Musculoskeletal: There are no aggressive appearing lytic or blastic lesions noted in the visualized portions of the skeleton.  IMPRESSION: 1. Extensive lymphadenopathy in the chest, abdomen and pelvis, with massive splenomegaly, as detailed above, highly concerning for lymphoma. 2. Numerous  nonobstructive calculi within the collecting systems of the kidneys bilaterally, largest of which on the right side measure up to 11 mm. In addition, there are 2 calculi in the proximal right ureter just distal to the right ureteropelvic junction, largest of which measures up to 1 cm. Despite this, there does not appear to be significant urinary tract obstruction at this time. 3. Trace volume of ascites. 4. Small amount of pericardial fluid and/or thickening, unlikely to be of hemodynamic significance at this time.   Electronically Signed   By: Vinnie Langton M.D.   On: 08/04/2013 14:42   Ct Abdomen Pelvis W Contrast  08/04/2013   CLINICAL DATA:  Anemia, fever and weight loss. Evaluate for potential lymphoma. HIV-positive patient.  EXAM: CT CHEST, ABDOMEN AND PELVIS WITHOUT CONTRAST  TECHNIQUE: Multidetector CT imaging of the chest, abdomen and pelvis was performed following the standard protocol without IV contrast.  COMPARISON:  No priors.  FINDINGS: CT CHEST FINDINGS  Mediastinum: Numerous borderline enlarged and mildly enlarged mediastinal lymph nodes are noted, largest of which include a 12 mm low left paratracheal lymph node and a 11 mm lymph node in the superior mediastinum immediately to the right of the innominate artery. Enlarged retrocrural and posterior mediastinal lymph nodes measuring up to 15 mm in short axis. Heart size is normal. Small amount of pericardial fluid and/or thickening, unlikely to be of hemodynamic significance at this time. No associated pericardial calcification. Esophagus is unremarkable in appearance.  Lungs/Pleura: No acute consolidative airspace disease. No pleural effusions. No suspicious appearing pulmonary nodules or masses.  Musculoskeletal: There are no aggressive appearing lytic or blastic lesions noted in the visualized portions of the skeleton.  CT ABDOMEN AND PELVIS FINDINGS  Abdomen/Pelvis: The spleen is massively enlarged measuring 14.3 x 7.5 x 24.0 cm (estimated  splenic volume of 1,287 mL), and the spleen is diffusely heterogeneous in attenuation/enhancement, suggesting diffuse infiltration by a innumerable lesions, presumably lymphomatous. There is extensive lymphadenopathy throughout the retroperitoneum and upper abdomen. Numerous enlarged lymph nodes in the celiac axis are noted, best demonstrated on image 58 of series 2 retroperitoneal lymph nodes measuring up to 21 mm in short axis are noted in the left para-aortic station anterior to the left renal hilum. This retroperitoneal lymphadenopathy extends to the pelvis where there are also bilateral external iliac lymph nodes (left greater than right), largest of which on the left side measures up to 15 mm.  The appearance of the liver, gallbladder, pancreas and bilateral adrenal glands is unremarkable. There are numerous nonobstructive calculi within the collecting systems of the kidneys bilaterally, largest of which measures 11 mm in the upper pole collecting system of the right kidney. In addition, there appear to be 2 calculi in the proximal right ureter just beyond the right ureteropelvic junction, largest of which measures 1 cm. Mild fullness in the renal collecting systems bilaterally, but no frank hydroureteronephrosis or perinephric stranding to suggest significant urinary tract obstruction at  this time. No bladder calculi identified.  Trace volume of ascites in the low anatomic pelvis. No pneumoperitoneum. No pathologic distention of small bowel. Prostate gland and urinary bladder are unremarkable in appearance.  Musculoskeletal: There are no aggressive appearing lytic or blastic lesions noted in the visualized portions of the skeleton.  IMPRESSION: 1. Extensive lymphadenopathy in the chest, abdomen and pelvis, with massive splenomegaly, as detailed above, highly concerning for lymphoma. 2. Numerous nonobstructive calculi within the collecting systems of the kidneys bilaterally, largest of which on the right side  measure up to 11 mm. In addition, there are 2 calculi in the proximal right ureter just distal to the right ureteropelvic junction, largest of which measures up to 1 cm. Despite this, there does not appear to be significant urinary tract obstruction at this time. 3. Trace volume of ascites. 4. Small amount of pericardial fluid and/or thickening, unlikely to be of hemodynamic significance at this time.   Electronically Signed   By: Vinnie Langton M.D.   On: 08/04/2013 14:42    Scheduled Meds: . efavirenz-emtricitabine-tenofovir  1 tablet Oral QHS  . feeding supplement (ENSURE COMPLETE)  237 mL Oral TID WC  . ferrous sulfate  325 mg Oral BID WC  . pantoprazole  40 mg Oral BID AC   Continuous Infusions: . sodium chloride 75 mL/hr at 08/05/13 1800  . sodium chloride 125 mL/hr at 08/05/13 1800    Principal Problem:   Anemia, unspecified Active Problems:   Anemia   Iron deficiency anemia, unspecified   Protein-calorie malnutrition, severe    Time spent: [facility] Pager [phone]. If 7PM-7AM, please contact night-coverage at [email or web address], password [facility] 08/05/2013, 6:40 PM  LOS: 3 days

## 2013-08-05 NOTE — Progress Notes (Signed)
Results of the CT scan noted. He will need a lymph node biopsy to confirm etiology. The biopsy has to be excisional (by ENT of the neck or General surgery for abdominal). Once the results of the biopsy are in, we will comment on the results.

## 2013-08-06 DIAGNOSIS — R599 Enlarged lymph nodes, unspecified: Secondary | ICD-10-CM

## 2013-08-06 DIAGNOSIS — I498 Other specified cardiac arrhythmias: Secondary | ICD-10-CM

## 2013-08-06 LAB — CBC
HCT: 23.5 % — ABNORMAL LOW (ref 39.0–52.0)
Hemoglobin: 7.3 g/dL — ABNORMAL LOW (ref 13.0–17.0)
MCH: 30.3 pg (ref 26.0–34.0)
MCHC: 31.1 g/dL (ref 30.0–36.0)
MCV: 97.5 fL (ref 78.0–100.0)
Platelets: 87 10*3/uL — ABNORMAL LOW (ref 150–400)
RBC: 2.41 MIL/uL — ABNORMAL LOW (ref 4.22–5.81)
RDW: 24.7 % — ABNORMAL HIGH (ref 11.5–15.5)
WBC: 4.3 10*3/uL (ref 4.0–10.5)

## 2013-08-06 LAB — PREPARE RBC (CROSSMATCH)

## 2013-08-06 MED ORDER — SODIUM CHLORIDE 0.9 % IV BOLUS (SEPSIS)
500.0000 mL | Freq: Once | INTRAVENOUS | Status: AC
  Administered 2013-08-06: 500 mL via INTRAVENOUS

## 2013-08-06 NOTE — Progress Notes (Addendum)
[facility]    Date of Admission:  08/02/2013   Total days of antibiotics 0           ID: Thomas [name] is a 33 y.o. male with HIV, CD 4 count of 660/VL 289 (previously 23 copies) on atripla present with significant weight loss anemia, and LAD concern for malignancy. Imaging suggestive of numerous lymphadenopathy to chest and abd and has massive splenomegaly  Principal Problem:   Anemia, unspecified Active Problems:   Anemia   Iron deficiency anemia, unspecified   Protein-calorie malnutrition, severe    Subjective: Afebrile. Being evaluated for surgical biopsy of supraclavicular LN. Wants to improve on nutritional intake  Medications:  . efavirenz-emtricitabine-tenofovir  1 tablet Oral QHS  . feeding supplement (ENSURE COMPLETE)  237 mL Oral TID WC  . ferrous sulfate  325 mg Oral BID WC  . pantoprazole  40 mg Oral BID AC    Objective: Vital signs in last 24 hours: Temp:  [97.6 F (36.4 C)-98.5 F (36.9 C)] 98.2 F (36.8 C) (01/14 0605) Pulse Rate:  [101-119] 101 (01/14 0605) Resp:  [15-22] 16 (01/14 0605) BP: (95-113)/(50-80) 95/55 mmHg (01/14 0605) SpO2:  [100 %] 100 % (01/14 9833)  Physical Exam  Constitutional: He is oriented to person, place, and time. He appears cachetic. No distress. cachetic HENT:  Mouth/Throat: Oropharynx is clear and moist. No oropharyngeal exudate.  Cardiovascular: Normal rate, regular rhythm and normal heart sounds. Exam reveals no gallop and no friction rub.  No murmur heard.  Pulmonary/Chest: Effort normal and breath sounds normal. No respiratory distress. He has no wheezes.  Abdominal: Soft. Bowel sounds are normal. He exhibits no distension. There is no tenderness. + splenomegaly Lymphadenopathy: right supraclavicular LN, and shotty left sided cervical adenopathy.  Neurological: He is alert and oriented to person, place, and time.  Skin: Skin is warm and dry. No rash noted. No erythema.  Psychiatric: He  has a normal mood and affect. His behavior is normal.    Lab Results  Recent Labs  08/04/13 0339 08/06/13 0451  WBC 3.3* 4.3  HGB 7.1* 7.3*  HCT 22.5* 23.5*  NA 141  --   K 3.6*  --   CL 111  --   CO2 20  --   BUN 12  --   CREATININE 0.71  --    Liver Panel No results found for this basename: PROT, ALBUMIN, AST, ALT, ALKPHOS, BILITOT, BILIDIR, IBILI,  in the last 72 hours Microbiology: 1/11 blood cx NGTD 1/11 ur cx 100,000 CoNS, but patient asymptomatic 1/11 stool cx pending 1/12 cdiff negative Studies/Results: Ct Chest W Contrast  08/04/2013   CLINICAL DATA:  Anemia, fever and weight loss. Evaluate for potential lymphoma. HIV-positive patient.  EXAM: CT CHEST, ABDOMEN AND PELVIS WITHOUT CONTRAST  TECHNIQUE: Multidetector CT imaging of the chest, abdomen and pelvis was performed following the standard protocol without IV contrast.  COMPARISON:  No priors.  FINDINGS: CT CHEST FINDINGS  Mediastinum: Numerous borderline enlarged and mildly enlarged mediastinal lymph nodes are noted, largest of which include a 12 mm low left paratracheal lymph node and a 11 mm lymph node in the superior mediastinum immediately to the right of the innominate artery. Enlarged retrocrural and posterior mediastinal lymph nodes measuring up to 15 mm in short axis. Heart size is normal. Small amount of pericardial fluid and/or thickening, unlikely to be of hemodynamic significance at this time. No associated pericardial calcification. Esophagus is unremarkable in appearance.  Lungs/Pleura: No acute consolidative airspace disease. No pleural effusions. No suspicious appearing pulmonary nodules or masses.  Musculoskeletal: There are no aggressive appearing lytic or blastic lesions noted in the visualized portions of the skeleton.  CT ABDOMEN AND PELVIS FINDINGS  Abdomen/Pelvis: The spleen is massively enlarged measuring 14.3 x 7.5 x 24.0 cm (estimated splenic volume of 1,287 mL), and the spleen is diffusely  heterogeneous in attenuation/enhancement, suggesting diffuse infiltration by a innumerable lesions, presumably lymphomatous. There is extensive lymphadenopathy throughout the retroperitoneum and upper abdomen. Numerous enlarged lymph nodes in the celiac axis are noted, best demonstrated on image 58 of series 2 retroperitoneal lymph nodes measuring up to 21 mm in short axis are noted in the left para-aortic station anterior to the left renal hilum. This retroperitoneal lymphadenopathy extends to the pelvis where there are also bilateral external iliac lymph nodes (left greater than right), largest of which on the left side measures up to 15 mm.  The appearance of the liver, gallbladder, pancreas and bilateral adrenal glands is unremarkable. There are numerous nonobstructive calculi within the collecting systems of the kidneys bilaterally, largest of which measures 11 mm in the upper pole collecting system of the right kidney. In addition, there appear to be 2 calculi in the proximal right ureter just beyond the right ureteropelvic junction, largest of which measures 1 cm. Mild fullness in the renal collecting systems bilaterally, but no frank hydroureteronephrosis or perinephric stranding to suggest significant urinary tract obstruction at this time. No bladder calculi identified.  Trace volume of ascites in the low anatomic pelvis. No pneumoperitoneum. No pathologic distention of small bowel. Prostate gland and urinary bladder are unremarkable in appearance.  Musculoskeletal: There are no aggressive appearing lytic or blastic lesions noted in the visualized portions of the skeleton.  IMPRESSION: 1. Extensive lymphadenopathy in the chest, abdomen and pelvis, with massive splenomegaly, as detailed above, highly concerning for lymphoma. 2. Numerous nonobstructive calculi within the collecting systems of the kidneys bilaterally, largest of which on the right side measure up to 11 mm. In addition, there are 2 calculi in  the proximal right ureter just distal to the right ureteropelvic junction, largest of which measures up to 1 cm. Despite this, there does not appear to be significant urinary tract obstruction at this time. 3. Trace volume of ascites. 4. Small amount of pericardial fluid and/or thickening, unlikely to be of hemodynamic significance at this time.   Electronically Signed   By: Vinnie Langton M.D.   On: 08/04/2013 14:42   Ct Abdomen Pelvis W Contrast  08/04/2013   CLINICAL DATA:  Anemia, fever and weight loss. Evaluate for potential lymphoma. HIV-positive patient.  EXAM: CT CHEST, ABDOMEN AND PELVIS WITHOUT CONTRAST  TECHNIQUE: Multidetector CT imaging of the chest, abdomen and pelvis was performed following the standard protocol without IV contrast.  COMPARISON:  No priors.  FINDINGS: CT CHEST FINDINGS  Mediastinum: Numerous borderline enlarged and mildly enlarged mediastinal lymph nodes are noted, largest of which include a 12 mm low left paratracheal lymph node and a 11 mm lymph node in the superior mediastinum immediately to the right of the innominate artery. Enlarged retrocrural and posterior mediastinal lymph nodes measuring up to 15 mm in short axis. Heart size is normal. Small amount of pericardial fluid and/or thickening, unlikely to be of hemodynamic significance at this time. No associated pericardial calcification. Esophagus is unremarkable in appearance.  Lungs/Pleura: No acute consolidative airspace disease. No pleural effusions. No suspicious appearing pulmonary nodules or masses.  Musculoskeletal:  There are no aggressive appearing lytic or blastic lesions noted in the visualized portions of the skeleton.  CT ABDOMEN AND PELVIS FINDINGS  Abdomen/Pelvis: The spleen is massively enlarged measuring 14.3 x 7.5 x 24.0 cm (estimated splenic volume of 1,287 mL), and the spleen is diffusely heterogeneous in attenuation/enhancement, suggesting diffuse infiltration by a innumerable lesions, presumably  lymphomatous. There is extensive lymphadenopathy throughout the retroperitoneum and upper abdomen. Numerous enlarged lymph nodes in the celiac axis are noted, best demonstrated on image 58 of series 2 retroperitoneal lymph nodes measuring up to 21 mm in short axis are noted in the left para-aortic station anterior to the left renal hilum. This retroperitoneal lymphadenopathy extends to the pelvis where there are also bilateral external iliac lymph nodes (left greater than right), largest of which on the left side measures up to 15 mm.  The appearance of the liver, gallbladder, pancreas and bilateral adrenal glands is unremarkable. There are numerous nonobstructive calculi within the collecting systems of the kidneys bilaterally, largest of which measures 11 mm in the upper pole collecting system of the right kidney. In addition, there appear to be 2 calculi in the proximal right ureter just beyond the right ureteropelvic junction, largest of which measures 1 cm. Mild fullness in the renal collecting systems bilaterally, but no frank hydroureteronephrosis or perinephric stranding to suggest significant urinary tract obstruction at this time. No bladder calculi identified.  Trace volume of ascites in the low anatomic pelvis. No pneumoperitoneum. No pathologic distention of small bowel. Prostate gland and urinary bladder are unremarkable in appearance.  Musculoskeletal: There are no aggressive appearing lytic or blastic lesions noted in the visualized portions of the skeleton.  IMPRESSION: 1. Extensive lymphadenopathy in the chest, abdomen and pelvis, with massive splenomegaly, as detailed above, highly concerning for lymphoma. 2. Numerous nonobstructive calculi within the collecting systems of the kidneys bilaterally, largest of which on the right side measure up to 11 mm. In addition, there are 2 calculi in the proximal right ureter just distal to the right ureteropelvic junction, largest of which measures up to 1  cm. Despite this, there does not appear to be significant urinary tract obstruction at this time. 3. Trace volume of ascites. 4. Small amount of pericardial fluid and/or thickening, unlikely to be of hemodynamic significance at this time.   Electronically Signed   By: Vinnie Langton M.D.   On: 08/04/2013 14:42     Assessment/Plan: Lymphadenopathy = concur with primary team to do biopsy in order to determine if presentation is c/w lymphoma. Consider getting hem/onc consultation. Recommend to send some tissue for aerobic, fungal, and AFB culture. Will check for EBV viral load to see if related to probable  hiv = continue with atripla. Patient reports being very adherent to his meds. 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 CD 4 count 660, no risk for OI.  Cachexia/weight loss = probably due to malignancy. Would do calorie count. Consider adding megace as appetite stimulant  Amias Hutchinson, [facility] for Infectious Diseases Cell: [phone] Pager: [phone]  08/06/2013, 11:25 AM

## 2013-08-06 NOTE — Consult Note (Signed)
[address]       [place] [address]             [phone]    Cardiothoracic Surgery Consultation   Reason for Consult: Extensive adenopathy and weight loss in HIV positive patient. Referring Physician: Minette Headland, MD  Thomas [name] is an 33 y.o. male.  HPI:   The patient is a 33 year old gentleman with a history of HIV diagnosed in October 2013 who has been treated with Atripla since then. He presented with altered mental status, anxiety, diaphoresis, shaking, and a history of significant weight loss over the past few weeks. He has also had frequent black stools and had a Hgb of 5.6 in the ER. He was noted to have significant bilateral supraclavicular adenopathy on exam and CT of chest, abdomen, and pelvis shows significant mediastinal, retrocrural, and abdominal/retroperitoneal lymphadenopathy with marked splenic enlargement.  Past Medical History  Diagnosis Date  . HIV disease 06/25/2012  . Skin lesion 07/03/2012  . Leg pain 07/03/2012  . Sinus tachycardia 08/06/2012  . Penile cyst 08/06/2012    Past Surgical History  Procedure Laterality Date  . 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  this after several previous I and Ds of rectal abcess.     History reviewed. No pertinent family history.  Social History:  reports that he has been smoking Cigarettes.  He has a 15 pack-year smoking history. He has never used smokeless tobacco. He reports that he does not drink alcohol or use illicit drugs.  Allergies:  Allergies  Allergen Reactions  . Tylenol [Acetaminophen]     sweats    Medications:  I have reviewed the patient's current medications. Prior to Admission:  Prescriptions prior to admission  Medication Sig Dispense Refill  . atenolol (TENORMIN) 25 MG tablet Take 1 tablet (25 mg total) by mouth daily.  30 tablet  11  . efavirenz-emtricitabine-tenofovir (ATRIPLA) 600-200-300  MG per tablet Take 1 tablet by mouth at bedtime.      . ferrous sulfate 325 (65 FE) MG tablet Take 1 tablet (325 mg total) by mouth 2 (two) times daily with a meal.  60 tablet  3   Scheduled: . efavirenz-emtricitabine-tenofovir  1 tablet Oral QHS  . feeding supplement (ENSURE COMPLETE)  237 mL Oral TID WC  . ferrous sulfate  325 mg Oral BID WC  . pantoprazole  40 mg Oral BID AC   Continuous: . sodium chloride 75 mL/hr at 08/05/13 1800  . sodium chloride 125 mL/hr at 08/06/13 1947   [id number], acetaminophen, HYDROcodone-acetaminophen, morphine injection, ondansetron (ZOFRAN) IV, ondansetron, zolpidem Anti-infectives   Start     Dose/Rate Route Frequency Ordered Stop   08/02/13 2200  efavirenz-emtricitabine-tenofovir (ATRIPLA) 600-200-300 MG per tablet 1 tablet     1 tablet Oral Daily at bedtime 08/02/13 1835        Results for orders placed during the hospital encounter of 08/02/13 (from the past 48 hour(s))  CLOSTRIDIUM DIFFICILE BY PCR     Status: None   Collection Time    08/04/13 10:07 PM      Result Value Range   C difficile by pcr NEGATIVE  NEGATIVE  OCCULT BLOOD X 1 CARD TO LAB, STOOL     Status: None   Collection Time    08/04/13 10:07 PM      Result Value Range   Fecal Occult Bld NEGATIVE  NEGATIVE  CBC     Status: Abnormal   Collection Time    08/06/13  4:51 AM      Result Value Range   WBC 4.3  4.0 - 10.5 K/uL   RBC 2.41 (*) 4.22 - 5.81 MIL/uL   Hemoglobin 7.3 (*) 13.0 - 17.0 g/dL   HCT 23.5 (*) 39.0 - 52.0 %   MCV 97.5  78.0 - 100.0 fL   MCH 30.3  26.0 - 34.0 pg   MCHC 31.1  30.0 - 36.0 g/dL   RDW 24.7 (*) 11.5 - 15.5 %   Platelets 87 (*) 150 - 400 K/uL   Comment: CONSISTENT WITH PREVIOUS RESULT  PREPARE RBC (CROSSMATCH)     Status: None   Collection Time    08/06/13  4:13 PM      Result Value Range   Order Confirmation ORDER PROCESSED BY BLOOD BANK    TYPE AND SCREEN     Status: None   Collection Time    08/06/13  4:13 PM      Result Value  Range   ABO/RH(D) O POS     Antibody Screen NEG     Sample Expiration 08/09/2013     Unit Number [id number]     Blood Component Type RED CELLS,LR     Unit division 00     Status of Unit ALLOCATED     Transfusion Status OK TO TRANSFUSE     Crossmatch Result Compatible      No results found.  Review of Systems  Constitutional: Positive for weight loss, malaise/fatigue and diaphoresis.  HENT: Negative.   Eyes: Negative.   Respiratory: Positive for shortness of breath and wheezing.   Cardiovascular: Negative.   Gastrointestinal: Negative.   Genitourinary: Negative.   Musculoskeletal: Negative.   Skin: Negative.   Neurological: Negative.   Endo/Heme/Allergies: Negative.   Psychiatric/Behavioral: Negative.    Blood pressure 78/50, pulse 123, temperature 98.7 F (37.1 C), temperature source Oral, resp. rate 16, height 6\' 1"  (1.854 m), weight 106 lb 7.7 oz (48.3 kg), SpO2 100.00%. Physical Exam  Constitutional: He is oriented to person, place, and time.  Chronically ill-appearing cachectic black male in no distress  HENT:  Head: Normocephalic and atraumatic.  Mouth/Throat: Oropharynx is clear and moist.  Eyes: Conjunctivae and EOM are normal. Pupils are equal, round, and reactive to light.  Neck: No JVD present. No thyromegaly present.  Bilateral supraclavicular lymphadenopathy that is firm, nontender and mobile.  Cardiovascular: Normal rate, regular rhythm, normal heart sounds and intact distal pulses.  Exam reveals no gallop and no friction rub.   No murmur heard. Respiratory: Breath sounds normal. No respiratory distress. He has no rales. He exhibits no tenderness.  GI: Soft. Bowel sounds are normal. He exhibits no distension. There is no tenderness.  Musculoskeletal: Normal range of motion. He exhibits no edema and no tenderness.  Neurological: He is alert and oriented to person, place, and time. No cranial nerve deficit or sensory deficit.  Skin: Skin is warm and dry.    Psychiatric: He has a normal mood and affect.   CLINICAL DATA: Anemia, fever and weight loss. Evaluate for  potential lymphoma. HIV-positive patient.  EXAM:  CT CHEST, ABDOMEN AND PELVIS WITHOUT CONTRAST  TECHNIQUE:  Multidetector CT imaging of the chest, abdomen and pelvis was  performed following the standard protocol without IV contrast.  COMPARISON: No priors.  FINDINGS:  CT CHEST FINDINGS  Mediastinum: Numerous borderline enlarged and mildly enlarged  mediastinal lymph nodes  are noted, largest of which include a 12 mm  low left paratracheal lymph node and a 11 mm lymph node in the  superior mediastinum immediately to the right of the innominate  artery. Enlarged retrocrural and posterior mediastinal lymph nodes  measuring up to 15 mm in short axis. Heart size is normal. Small  amount of pericardial fluid and/or thickening, unlikely to be of  hemodynamic significance at this time. No associated pericardial  calcification. Esophagus is unremarkable in appearance.  Lungs/Pleura: No acute consolidative airspace disease. No pleural  effusions. No suspicious appearing pulmonary nodules or masses.  Musculoskeletal: There are no aggressive appearing lytic or blastic  lesions noted in the visualized portions of the skeleton.  CT ABDOMEN AND PELVIS FINDINGS  Abdomen/Pelvis: The spleen is massively enlarged measuring 14.3 x  7.5 x 24.0 cm (estimated splenic volume of 1,287 mL), and the spleen  is diffusely heterogeneous in attenuation/enhancement, suggesting  diffuse infiltration by a innumerable lesions, presumably  lymphomatous. There is extensive lymphadenopathy throughout the  retroperitoneum and upper abdomen. Numerous enlarged lymph nodes in  the celiac axis are noted, best demonstrated on image 58 of series 2  retroperitoneal lymph nodes measuring up to 21 mm in short axis are  noted in the left para-aortic station anterior to the left renal  hilum. This retroperitoneal  lymphadenopathy extends to the pelvis  where there are also bilateral external iliac lymph nodes (left  greater than right), largest of which on the left side measures up  to 15 mm.  The appearance of the liver, gallbladder, pancreas and bilateral  adrenal glands is unremarkable. There are numerous nonobstructive  calculi within the collecting systems of the kidneys bilaterally,  largest of which measures 11 mm in the upper pole collecting system  of the right kidney. In addition, there appear to be 2 calculi in  the proximal right ureter just beyond the right ureteropelvic  junction, largest of which measures 1 cm. Mild fullness in the renal  collecting systems bilaterally, but no frank hydroureteronephrosis  or perinephric stranding to suggest significant urinary tract  obstruction at this time. No bladder calculi identified.  Trace volume of ascites in the low anatomic pelvis. No  pneumoperitoneum. No pathologic distention of small bowel. Prostate  gland and urinary bladder are unremarkable in appearance.  Musculoskeletal: There are no aggressive appearing lytic or blastic  lesions noted in the visualized portions of the skeleton.  IMPRESSION:  1. Extensive lymphadenopathy in the chest, abdomen and pelvis, with  massive splenomegaly, as detailed above, highly concerning for  lymphoma.  2. Numerous nonobstructive calculi within the collecting systems of  the kidneys bilaterally, largest of which on the right side measure  up to 11 mm. In addition, there are 2 calculi in the proximal right  ureter just distal to the right ureteropelvic junction, largest of  which measures up to 1 cm. Despite this, there does not appear to be  significant urinary tract obstruction at this time.  3. Trace volume of ascites.  4. Small amount of pericardial fluid and/or thickening, unlikely to  be of hemodynamic significance at this time.  Electronically Signed  By: Vinnie Langton M.D.  On: 08/04/2013  14:42   Assessment/Plan:  He has HIV disease with anemia, leukopenia, and thrombocytopenia, marked weight loss and diffuse lymphadenopathy. I have been consulted to get a lymph node biopsy to aid with further diagnosis and management, rule out lymphoma. I think the right supraclavicular lymph nodes are the most amenable to excisional  biopsy. I discussed the procedure, indications, benefits, and risks with the patient and his mother. They understand and agree to proceed. I will do this on Friday.  BARTLE,BRYAN K 08/06/2013, 8:02 PM

## 2013-08-06 NOTE — Progress Notes (Addendum)
[facility]                                                                              Patient Demographics  Thomas [name], is a 33 y.o. male, DOB - 1980-08-16, YHC:[id number]  Admit date - 08/02/2013   Admitting Physician Thomas Border, MD  Outpatient Primary MD for the patient is Thomas Fendt, MD  LOS - 4   Chief Complaint  Patient presents with  . Weight Loss    onset 2 weeks unknown weight loss, decreased appetite  . Fatigue    x 1 1/2 month   . Abdominal Pain    onset several days suprapubic pain  . Diarrhea    onset 2 weeks diarreha x 1-2 per day         Assessment & Plan   Symptomatic anemia, iron deficiency -H/H 7.3/23.5 -Patient remained symptomatic with hypotension as well as tachycardia -Patient has received 2 units of packed red blood cells, will order one additional unit to be given -FOBT was negative, GI was consulted and did recommend endoscopy at this time -Hematology also consulted and recommended lymph node biopsy -Patient has been followed by Dr. Bobbye [name] data in the past and was seen in December 2014 and was placed on iron supplementation, with plans for IV iron  Hypotension with hypovolemia -Infectious versus hypovolemia (from anemia) versus medication induced -To date, urine and blood cultures as well as stool cultures negative -C. difficile PCR negative -Atenolol discontinued on 08/02/2013 -TSH as well as cortisol levels were in normal limits -Will give 500 cc bolus of normal saline and continue to monitor  HIV/AIDS -Continue atripla -Infectious disease also following -CD4 count 660  Extensive lymphadenopathy and massive splenomegaly -CT scan of the abdomen and chest with extensive lymphadenopathy -ENT, Thomas [name] consulted for possible lymph node biopsy, , however verbal altercation with patient's mother -Thoracic surgery consulted by Dr. Dillard [name] on 1/13//15, and reconsulted today -Will also consult IR for possible biopsy  Altered  mental status -Resolved, patient is back to his baseline -CT of the head was negative   Protein malnutrition -Will consult nutrition -Continue being supplementation -Consider Megace  Code Status: Full Family Communication: Mother at bedside Disposition Plan: Admitted  Time Spent in minutes   35 minutes  Procedures none  Consults   Infectious disease Oncology Thoracic surgery Interventional radiology  DVT Prophylaxis  TED hose  Lab Results  Component Value Date   PLT 87* 08/06/2013    Medications  Scheduled Meds: . efavirenz-emtricitabine-tenofovir  1 tablet Oral QHS  . feeding supplement (ENSURE COMPLETE)  237 mL Oral TID WC  . ferrous sulfate  325 mg Oral BID WC  . pantoprazole  40 mg Oral BID AC  . sodium chloride  500 mL Intravenous Once   Continuous Infusions: . sodium chloride 75 mL/hr at 08/05/13 1800  . sodium chloride 125 mL/hr at 08/06/13 0540   PRN Meds:.acetaminophen, acetaminophen, HYDROcodone-acetaminophen, morphine injection, ondansetron (ZOFRAN) IV, ondansetron, zolpidem  Antibiotics    Anti-infectives   Start     Dose/Rate Route Frequency Ordered Stop   08/02/13 2200  efavirenz-emtricitabine-tenofovir (ATRIPLA) 600-200-300 MG per tablet 1 tablet     1 tablet Oral  Daily at bedtime 08/02/13 1835          Subjective:   Thomas [name] seen and examined today.  Patient has no complaints today. States he's feeling improved and his appetite is back. He states is extremely hungry.   Objective:   Filed Vitals:   08/05/13 2133 08/05/13 2228 08/06/13 0605 08/06/13 1446  BP: 102/50 109/66 95/55 78/50   Pulse: 119 115 101 123  Temp:  98.5 F (36.9 C) 98.2 F (36.8 C) 98.7 F (37.1 C)  TempSrc:  Oral Oral Oral  Resp: 22 16 16 16   Height:      Weight:      SpO2: 100% 100% 100% 100%    Wt Readings from Last 3 Encounters:  08/03/13 48.3 kg (106 lb 7.7 oz)  07/08/13 54.432 kg (120 lb)  07/02/13 55.339 kg (122 lb)     Intake/Output  Summary (Last 24 hours) at 08/06/13 1518 Last data filed at 08/06/13 1415  Gross per 24 hour  Intake 8378.75 ml  Output   3500 ml  Net 4878.75 ml    Exam  General: Well developed, malnourished, NAD, appears stated age  HEENT: NCAT, PERRLA, EOMI, Anicteic Sclera, mucous membranes moist. No pharyngeal erythema or exudates  Neck: Supple, no JVD, bilateral supraclavicular lymph nodes present  Cardiovascular: S1 S2 auscultated, no rubs, murmurs or gallops. Regular rate and rhythm.  Respiratory: Clear to auscultation bilaterally with equal chest rise  Abdomen: Soft, nontender, nondistended, + bowel sounds  Extremities: warm dry without cyanosis clubbing or edema  Neuro: AAOx3, cranial nerves grossly intact. Strength 5/5 in patient's upper and lower extremities bilaterally  Skin: Without rashes exudates or nodules  Psych: Normal affect and demeanor with intact judgement and insight   Data Review   Micro Results Recent Results (from the past 240 hour(s))  CMV CULTURE CMVC     Status: None   Collection Time    08/02/13  9:12 PM      Result Value Range Status   Specimen Description NASAL SWAB   Final   Special Requests NONE   Final   Culture     Final   Value: Culture has been initiated.     Performed at [facility]   Report Status PENDING   Incomplete  GC/CHLAMYDIA PROBE AMP     Status: None   Collection Time    08/03/13  2:04 PM      Result Value Range Status   CT Probe RNA NEGATIVE  NEGATIVE Final   GC Probe RNA NEGATIVE  NEGATIVE Final   Comment: (NOTE)                                                                                               **Normal Reference Range: Negative**          Assay performed using the Gen-Probe APTIMA COMBO2 (R) Assay.     Acceptable specimen types for this assay include APTIMA Swabs (Unisex,     endocervical, urethral, or vaginal), first void urine, and ThinPrep     liquid based cytology samples.     Performed at  [facility]  URINE CULTURE     Status: None   Collection Time    08/03/13  2:04 PM      Result Value Range Status   Specimen Description URINE, RANDOM   Final   Special Requests NONE   Final   Culture  Setup Time     Final   Value: 08/03/2013 18:59     Performed at [place]     Final   Value: >=100,000 COLONIES/ML     Performed at [facility]   Culture     Final   Value: STAPHYLOCOCCUS SPECIES (COAGULASE NEGATIVE)     Note: RIFAMPIN AND GENTAMICIN SHOULD NOT BE USED AS SINGLE DRUGS FOR TREATMENT OF STAPH INFECTIONS.     Performed at [facility]   Report Status 08/05/2013 FINAL   Final   Organism ID, Bacteria STAPHYLOCOCCUS SPECIES (COAGULASE NEGATIVE)   Final  CULTURE, BLOOD (ROUTINE X 2)     Status: None   Collection Time    08/03/13  2:14 PM      Result Value Range Status   Specimen Description BLOOD RIGHT ARM   Final   Special Requests BOTTLES DRAWN AEROBIC AND ANAEROBIC 10CC EACH   Final   Culture  Setup Time     Final   Value: 08/03/2013 22:51     Performed at [facility]   Culture     Final   Value:        BLOOD CULTURE RECEIVED NO GROWTH TO DATE CULTURE WILL BE HELD FOR 5 DAYS BEFORE ISSUING A FINAL NEGATIVE REPORT     Performed at [facility]   Report Status PENDING   Incomplete  AFB CULTURE, BLOOD     Status: None   Collection Time    08/03/13  2:14 PM      Result Value Range Status   Specimen Description BLOOD RIGHT ARM   Final   Special Requests BOTTLES DRAWN AEROBIC ONLY 5CC   Final   Culture     Final   Value: CULTURE WILL BE EXAMINED FOR 6 WEEKS BEFORE ISSUING A FINAL REPORT     Performed at [facility]   Report Status PENDING   Incomplete  STOOL CULTURE     Status: None   Collection Time    08/03/13 10:48 PM      Result Value Range Status   Specimen Description STOOL   Final   Special Requests NONE   Final   Culture     Final   Value: NO SUSPICIOUS COLONIES, CONTINUING TO HOLD      Performed at [facility]   Report Status PENDING   Incomplete  CLOSTRIDIUM DIFFICILE BY PCR     Status: None   Collection Time    08/04/13 10:07 PM      Result Value Range Status   C difficile by pcr NEGATIVE  NEGATIVE Final    Radiology Reports Dg Chest 2 View  08/02/2013   CLINICAL DATA:  Shortness of breath and chest pain  EXAM: CHEST  2 VIEW  COMPARISON:  06/09/2008  FINDINGS: The heart size and mediastinal contours are within normal limits. Both lungs are clear. The visualized skeletal structures are unremarkable.  IMPRESSION: No active cardiopulmonary disease.   Electronically Signed   By: Inez Catalina M.D.   On: 08/02/2013 17:20   Ct Head Wo Contrast  08/02/2013   CLINICAL DATA:  Dizziness,  headache.  EXAM: CT HEAD WITHOUT CONTRAST  TECHNIQUE: Contiguous axial images were obtained from the base of the skull through the vertex without intravenous contrast.  COMPARISON:  None.  FINDINGS: Bony calvarium appears intact. No mass effect or midline shift is noted. Ventricular size is within normal limits. There is no evidence of mass lesion, hemorrhage or acute infarction.  IMPRESSION: No gross intracranial abnormality seen.   Electronically Signed   By: Sabino Dick M.D.   On: 08/02/2013 17:04   Ct Chest W Contrast  08/04/2013   CLINICAL DATA:  Anemia, fever and weight loss. Evaluate for potential lymphoma. HIV-positive patient.  EXAM: CT CHEST, ABDOMEN AND PELVIS WITHOUT CONTRAST  TECHNIQUE: Multidetector CT imaging of the chest, abdomen and pelvis was performed following the standard protocol without IV contrast.  COMPARISON:  No priors.  FINDINGS: CT CHEST FINDINGS  Mediastinum: Numerous borderline enlarged and mildly enlarged mediastinal lymph nodes are noted, largest of which include a 12 mm low left paratracheal lymph node and a 11 mm lymph node in the superior mediastinum immediately to the right of the innominate artery. Enlarged retrocrural and posterior mediastinal lymph nodes  measuring up to 15 mm in short axis. Heart size is normal. Small amount of pericardial fluid and/or thickening, unlikely to be of hemodynamic significance at this time. No associated pericardial calcification. Esophagus is unremarkable in appearance.  Lungs/Pleura: No acute consolidative airspace disease. No pleural effusions. No suspicious appearing pulmonary nodules or masses.  Musculoskeletal: There are no aggressive appearing lytic or blastic lesions noted in the visualized portions of the skeleton.  CT ABDOMEN AND PELVIS FINDINGS  Abdomen/Pelvis: The spleen is massively enlarged measuring 14.3 x 7.5 x 24.0 cm (estimated splenic volume of 1,287 mL), and the spleen is diffusely heterogeneous in attenuation/enhancement, suggesting diffuse infiltration by a innumerable lesions, presumably lymphomatous. There is extensive lymphadenopathy throughout the retroperitoneum and upper abdomen. Numerous enlarged lymph nodes in the celiac axis are noted, best demonstrated on image 58 of series 2 retroperitoneal lymph nodes measuring up to 21 mm in short axis are noted in the left para-aortic station anterior to the left renal hilum. This retroperitoneal lymphadenopathy extends to the pelvis where there are also bilateral external iliac lymph nodes (left greater than right), largest of which on the left side measures up to 15 mm.  The appearance of the liver, gallbladder, pancreas and bilateral adrenal glands is unremarkable. There are numerous nonobstructive calculi within the collecting systems of the kidneys bilaterally, largest of which measures 11 mm in the upper pole collecting system of the right kidney. In addition, there appear to be 2 calculi in the proximal right ureter just beyond the right ureteropelvic junction, largest of which measures 1 cm. Mild fullness in the renal collecting systems bilaterally, but no frank hydroureteronephrosis or perinephric stranding to suggest significant urinary tract obstruction at  this time. No bladder calculi identified.  Trace volume of ascites in the low anatomic pelvis. No pneumoperitoneum. No pathologic distention of small bowel. Prostate gland and urinary bladder are unremarkable in appearance.  Musculoskeletal: There are no aggressive appearing lytic or blastic lesions noted in the visualized portions of the skeleton.  IMPRESSION: 1. Extensive lymphadenopathy in the chest, abdomen and pelvis, with massive splenomegaly, as detailed above, highly concerning for lymphoma. 2. Numerous nonobstructive calculi within the collecting systems of the kidneys bilaterally, largest of which on the right side measure up to 11 mm. In addition, there are 2 calculi in the proximal right ureter just distal to  the right ureteropelvic junction, largest of which measures up to 1 cm. Despite this, there does not appear to be significant urinary tract obstruction at this time. 3. Trace volume of ascites. 4. Small amount of pericardial fluid and/or thickening, unlikely to be of hemodynamic significance at this time.   Electronically Signed   By: Vinnie Langton M.D.   On: 08/04/2013 14:42   Ct Abdomen Pelvis W Contrast  08/04/2013   CLINICAL DATA:  Anemia, fever and weight loss. Evaluate for potential lymphoma. HIV-positive patient.  EXAM: CT CHEST, ABDOMEN AND PELVIS WITHOUT CONTRAST  TECHNIQUE: Multidetector CT imaging of the chest, abdomen and pelvis was performed following the standard protocol without IV contrast.  COMPARISON:  No priors.  FINDINGS: CT CHEST FINDINGS  Mediastinum: Numerous borderline enlarged and mildly enlarged mediastinal lymph nodes are noted, largest of which include a 12 mm low left paratracheal lymph node and a 11 mm lymph node in the superior mediastinum immediately to the right of the innominate artery. Enlarged retrocrural and posterior mediastinal lymph nodes measuring up to 15 mm in short axis. Heart size is normal. Small amount of pericardial fluid and/or thickening,  unlikely to be of hemodynamic significance at this time. No associated pericardial calcification. Esophagus is unremarkable in appearance.  Lungs/Pleura: No acute consolidative airspace disease. No pleural effusions. No suspicious appearing pulmonary nodules or masses.  Musculoskeletal: There are no aggressive appearing lytic or blastic lesions noted in the visualized portions of the skeleton.  CT ABDOMEN AND PELVIS FINDINGS  Abdomen/Pelvis: The spleen is massively enlarged measuring 14.3 x 7.5 x 24.0 cm (estimated splenic volume of 1,287 mL), and the spleen is diffusely heterogeneous in attenuation/enhancement, suggesting diffuse infiltration by a innumerable lesions, presumably lymphomatous. There is extensive lymphadenopathy throughout the retroperitoneum and upper abdomen. Numerous enlarged lymph nodes in the celiac axis are noted, best demonstrated on image 58 of series 2 retroperitoneal lymph nodes measuring up to 21 mm in short axis are noted in the left para-aortic station anterior to the left renal hilum. This retroperitoneal lymphadenopathy extends to the pelvis where there are also bilateral external iliac lymph nodes (left greater than right), largest of which on the left side measures up to 15 mm.  The appearance of the liver, gallbladder, pancreas and bilateral adrenal glands is unremarkable. There are numerous nonobstructive calculi within the collecting systems of the kidneys bilaterally, largest of which measures 11 mm in the upper pole collecting system of the right kidney. In addition, there appear to be 2 calculi in the proximal right ureter just beyond the right ureteropelvic junction, largest of which measures 1 cm. Mild fullness in the renal collecting systems bilaterally, but no frank hydroureteronephrosis or perinephric stranding to suggest significant urinary tract obstruction at this time. No bladder calculi identified.  Trace volume of ascites in the low anatomic pelvis. No  pneumoperitoneum. No pathologic distention of small bowel. Prostate gland and urinary bladder are unremarkable in appearance.  Musculoskeletal: There are no aggressive appearing lytic or blastic lesions noted in the visualized portions of the skeleton.  IMPRESSION: 1. Extensive lymphadenopathy in the chest, abdomen and pelvis, with massive splenomegaly, as detailed above, highly concerning for lymphoma. 2. Numerous nonobstructive calculi within the collecting systems of the kidneys bilaterally, largest of which on the right side measure up to 11 mm. In addition, there are 2 calculi in the proximal right ureter just distal to the right ureteropelvic junction, largest of which measures up to 1 cm. Despite this, there does not appear  to be significant urinary tract obstruction at this time. 3. Trace volume of ascites. 4. Small amount of pericardial fluid and/or thickening, unlikely to be of hemodynamic significance at this time.   Electronically Signed   By: Vinnie Langton M.D.   On: 08/04/2013 14:42    CBC  Recent Labs Lab 08/02/13 1620 08/03/13 0630 08/04/13 0339 08/06/13 0451  WBC 4.6 3.2* 3.3* 4.3  HGB 5.6* 7.5* 7.1* 7.3*  HCT 19.3* 23.6* 22.5* 23.5*  PLT 103* 77* 86* 87*  MCV 99.5 94.0 94.9 97.5  MCH 28.9 29.9 30.0 30.3  MCHC 29.0* 31.8 31.6 31.1  RDW 26.1* 22.7* 24.0* 24.7*  LYMPHSABS 2.5  --   --   --   MONOABS 0.9  --   --   --   EOSABS 0.0  --   --   --   BASOSABS 0.0  --   --   --     Chemistries   Recent Labs Lab 08/02/13 1620 08/03/13 0710 08/04/13 0339  NA 143 145 141  K 3.8 3.8 3.6*  CL 109 112 111  CO2 23 22 20   GLUCOSE 100* 70 84  BUN 18 15 12   CREATININE 0.62 0.60 0.71  CALCIUM 8.2* 8.1* 7.5*  AST 17  --   --   ALT 7  --   --   ALKPHOS 123*  --   --   BILITOT 0.3  --   --    ------------------------------------------------------------------------------------------------------------------ estimated creatinine clearance is 92.2 ml/min (by C-G formula based  on Cr of 0.71). ------------------------------------------------------------------------------------------------------------------ No results found for this basename: HGBA1C,  in the last 72 hours ------------------------------------------------------------------------------------------------------------------ No results found for this basename: CHOL, HDL, LDLCALC, TRIG, CHOLHDL, LDLDIRECT,  in the last 72 hours ------------------------------------------------------------------------------------------------------------------ No results found for this basename: TSH, T4TOTAL, FREET3, T3FREE, THYROIDAB,  in the last 72 hours ------------------------------------------------------------------------------------------------------------------ No results found for this basename: VITAMINB12, FOLATE, FERRITIN, TIBC, IRON, RETICCTPCT,  in the last 72 hours  Coagulation profile No results found for this basename: INR, PROTIME,  in the last 168 hours  No results found for this basename: DDIMER,  in the last 72 hours  Cardiac Enzymes  Recent Labs Lab 08/03/13 0540 08/03/13 1005 08/03/13 1608  TROPONINI <0.30 <0.30 <0.30   ------------------------------------------------------------------------------------------------------------------ No components found with this basename: POCBNP,     Dianca Owensby D.O. on 08/06/2013 at 3:18 PM  Between 7am to 7pm - Pager - [phone]  After 7pm go to [email or web address] - password [email or web address]  And look for the night coverage person covering for me after hours  [facility] Office  [phone]

## 2013-08-07 DIAGNOSIS — L738 Other specified follicular disorders: Secondary | ICD-10-CM

## 2013-08-07 LAB — TYPE AND SCREEN
ABO/RH(D): O POS
Antibody Screen: NEGATIVE
Unit division: 0

## 2013-08-07 LAB — CBC
HCT: 25.3 % — ABNORMAL LOW (ref 39.0–52.0)
HEMOGLOBIN: 7.8 g/dL — AB (ref 13.0–17.0)
MCH: 30.4 pg (ref 26.0–34.0)
MCHC: 30.8 g/dL (ref 30.0–36.0)
MCV: 98.4 fL (ref 78.0–100.0)
Platelets: 78 10*3/uL — ABNORMAL LOW (ref 150–400)
RBC: 2.57 MIL/uL — ABNORMAL LOW (ref 4.22–5.81)
RDW: 23.1 % — AB (ref 11.5–15.5)
WBC: 4 10*3/uL (ref 4.0–10.5)

## 2013-08-07 LAB — CMV CULTURE CMVC

## 2013-08-07 MED ORDER — ENSURE COMPLETE PO LIQD
237.0000 mL | Freq: Three times a day (TID) | ORAL | Status: DC
  Administered 2013-08-07 – 2013-08-11 (×10): 237 mL via ORAL

## 2013-08-07 MED ORDER — CEFAZOLIN SODIUM 1-5 GM-% IV SOLN
1.0000 g | INTRAVENOUS | Status: DC
  Filled 2013-08-07: qty 50

## 2013-08-07 NOTE — Progress Notes (Signed)
[facility]                                                                              Patient Demographics  Thomas [name], is a 33 y.o. male, DOB - [birth year], EXH:[id number]  Admit date - 08/02/2013   Admitting Physician Merton Border, MD  Outpatient Primary MD for the patient is Philis Fendt, MD  LOS - 5   Chief Complaint  Patient presents with  . Weight Loss    onset 2 weeks unknown weight loss, decreased appetite  . Fatigue    x 1 1/2 month   . Abdominal Pain    onset several days suprapubic pain  . Diarrhea    onset 2 weeks diarreha x 1-2 per day         Assessment & Plan   Symptomatic anemia, iron deficiency -H/H 7.8/25.3 -Patient became tachycardic and hypotensive, received 1 unit PRBCs 08/06/13 (has received 3 units since admission) -FOBT was negative, GI was consulted and did recommend endoscopy at this time -Hematology also consulted and recommended lymph node biopsy which will be done Friday by Dr. Cyndia Bent (thoracic surg) -Patient has been followed by Dr. Bobbye Charleston data in the past and was seen in December 2014 and was placed on iron supplementation, with plans for IV iron  Hypotension with hypovolemia -Infectious versus hypovolemia (from anemia) versus medication induced -To date, urine and blood cultures as well as stool cultures negative -C. difficile PCR negative -Atenolol discontinued on 08/02/2013 -TSH as well as cortisol levels were in normal limits  HIV/AIDS -Continue atripla -Infectious disease also following -CD4 count 660  Extensive lymphadenopathy and massive splenomegaly -CT scan of the abdomen and chest with extensive lymphadenopathy -ENT, Dr. Janace Hoard consulted for possible lymph node biopsy, , however verbal altercation with patient's mother -Thoracic surgery consulted and planned biopsy for Friday, 1/16  Altered mental status -Resolved, patient is back to his baseline -CT of the head was negative   Protein malnutrition -Consulted  nutrition -Continue feeding supplementation -Consider Megace  Code Status: Full Family Communication: Mother at bedside Disposition Plan: Admitted  Time Spent in minutes   25 minutes  Procedures none  Consults   Infectious disease Oncology Thoracic surgery Interventional radiology  DVT Prophylaxis  TED hose  Lab Results  Component Value Date   PLT 78* 08/07/2013    Medications  Scheduled Meds: . efavirenz-emtricitabine-tenofovir  1 tablet Oral QHS  . feeding supplement (ENSURE COMPLETE)  237 mL Oral TID WC  . ferrous sulfate  325 mg Oral BID WC  . pantoprazole  40 mg Oral BID AC   Continuous Infusions: . sodium chloride 75 mL/hr at 08/05/13 1800  . sodium chloride 125 mL/hr at 08/06/13 1947   PRN Meds:.acetaminophen, acetaminophen, HYDROcodone-acetaminophen, morphine injection, ondansetron (ZOFRAN) IV, ondansetron, zolpidem  Antibiotics    Anti-infectives   Start     Dose/Rate Route Frequency Ordered Stop   08/02/13 2200  efavirenz-emtricitabine-tenofovir (ATRIPLA) 600-200-300 MG per tablet 1 tablet     1 tablet Oral Daily at bedtime 08/02/13 1835          Subjective:   Thomas [name] seen and examined today.  Patient has no complaints today and has been  eating.  He states he would like to gain weight..   Objective:   Filed Vitals:   08/06/13 2205 08/06/13 2300 08/07/13 0000 08/07/13 0556  BP: 109/67 99/65 94/58  98/63  Pulse: 69 110 120 67  Temp: 98.4 F (36.9 C) 98.3 F (36.8 C) 98.7 F (37.1 C) 97.8 F (36.6 C)  TempSrc: Oral Oral Oral Oral  Resp: 18 18 18 18   Height:      Weight:      SpO2: 100% 100% 100% 100%    Wt Readings from Last 3 Encounters:  08/03/13 48.3 kg (106 lb 7.7 oz)  07/08/13 54.432 kg (120 lb)  07/02/13 55.339 kg (122 lb)     Intake/Output Summary (Last 24 hours) at 08/07/13 1226 Last data filed at 08/07/13 0941  Gross per 24 hour  Intake   2586 ml  Output   5150 ml  Net  -2564 ml    Exam  General: Well  developed, malnourished, NAD, appears stated age  HEENT: NCAT, PERRLA, EOMI, Anicteic Sclera, mucous membranes moist.  Neck: Supple, no JVD, bilateral supraclavicular lymph nodes present  Cardiovascular: S1 S2 auscultated, no rubs, murmurs or gallops. Regular rate and rhythm.  Respiratory: Clear to auscultation bilaterally with equal chest rise  Abdomen: Soft, nontender, nondistended, + bowel sounds  Extremities: warm dry without cyanosis clubbing or edema  Neuro: AAOx3, cranial nerves grossly intact.   Skin: Without rashes exudates or nodules  Psych: Normal affect and demeanor with intact judgement and insight   Data Review   Micro Results Recent Results (from the past 240 hour(s))  CMV CULTURE CMVC     Status: None   Collection Time    08/02/13  9:12 PM      Result Value Range Status   Specimen Description NASAL SWAB   Final   Special Requests NONE   Final   Culture     Final   Value: Culture has been initiated.     Performed at [facility]   Report Status PENDING   Incomplete  GC/CHLAMYDIA PROBE AMP     Status: None   Collection Time    08/03/13  2:04 PM      Result Value Range Status   CT Probe RNA NEGATIVE  NEGATIVE Final   GC Probe RNA NEGATIVE  NEGATIVE Final   Comment: (NOTE)                                                                                               **Normal Reference Range: Negative**          Assay performed using the Gen-Probe APTIMA COMBO2 (R) Assay.     Acceptable specimen types for this assay include APTIMA Swabs (Unisex,     endocervical, urethral, or vaginal), first void urine, and ThinPrep     liquid based cytology samples.     Performed at [facility]     Status: None   Collection Time    08/03/13  2:04 PM      Result Value Range Status   Specimen Description URINE, RANDOM  Final   Special Requests NONE   Final   Culture  Setup Time     Final   Value: 08/03/2013 18:59     Performed at  [place]     Final   Value: >=100,000 COLONIES/ML     Performed at [facility]   Culture     Final   Value: STAPHYLOCOCCUS SPECIES (COAGULASE NEGATIVE)     Note: RIFAMPIN AND GENTAMICIN SHOULD NOT BE USED AS SINGLE DRUGS FOR TREATMENT OF STAPH INFECTIONS.     Performed at [facility]   Report Status 08/05/2013 FINAL   Final   Organism ID, Bacteria STAPHYLOCOCCUS SPECIES (COAGULASE NEGATIVE)   Final  CULTURE, BLOOD (ROUTINE X 2)     Status: None   Collection Time    08/03/13  2:14 PM      Result Value Range Status   Specimen Description BLOOD RIGHT ARM   Final   Special Requests BOTTLES DRAWN AEROBIC AND ANAEROBIC 10CC EACH   Final   Culture  Setup Time     Final   Value: 08/03/2013 22:51     Performed at [facility]   Culture     Final   Value:        BLOOD CULTURE RECEIVED NO GROWTH TO DATE CULTURE WILL BE HELD FOR 5 DAYS BEFORE ISSUING A FINAL NEGATIVE REPORT     Performed at [facility]   Report Status PENDING   Incomplete  AFB CULTURE, BLOOD     Status: None   Collection Time    08/03/13  2:14 PM      Result Value Range Status   Specimen Description BLOOD RIGHT ARM   Final   Special Requests BOTTLES DRAWN AEROBIC ONLY 5CC   Final   Culture     Final   Value: CULTURE WILL BE EXAMINED FOR 6 WEEKS BEFORE ISSUING A FINAL REPORT     Performed at [facility]   Report Status PENDING   Incomplete  STOOL CULTURE     Status: None   Collection Time    08/03/13 10:48 PM      Result Value Range Status   Specimen Description STOOL   Final   Special Requests NONE   Final   Culture     Final   Value: NO SUSPICIOUS COLONIES, CONTINUING TO HOLD     Performed at [facility]   Report Status PENDING   Incomplete  CLOSTRIDIUM DIFFICILE BY PCR     Status: None   Collection Time    08/04/13 10:07 PM      Result Value Range Status   C difficile by pcr NEGATIVE  NEGATIVE Final    Radiology Reports Dg Chest 2  View  08/02/2013   CLINICAL DATA:  Shortness of breath and chest pain  EXAM: CHEST  2 VIEW  COMPARISON:  06/09/2008  FINDINGS: The heart size and mediastinal contours are within normal limits. Both lungs are clear. The visualized skeletal structures are unremarkable.  IMPRESSION: No active cardiopulmonary disease.   Electronically Signed   By: Inez Catalina M.D.   On: 08/02/2013 17:20   Ct Head Wo Contrast  08/02/2013   CLINICAL DATA:  Dizziness, headache.  EXAM: CT HEAD WITHOUT CONTRAST  TECHNIQUE: Contiguous axial images were obtained from the base of the skull through the vertex without intravenous contrast.  COMPARISON:  None.  FINDINGS: Bony calvarium appears intact. No mass effect or  midline shift is noted. Ventricular size is within normal limits. There is no evidence of mass lesion, hemorrhage or acute infarction.  IMPRESSION: No gross intracranial abnormality seen.   Electronically Signed   By: Sabino Dick M.D.   On: 08/02/2013 17:04   Ct Chest W Contrast  08/04/2013   CLINICAL DATA:  Anemia, fever and weight loss. Evaluate for potential lymphoma. HIV-positive patient.  EXAM: CT CHEST, ABDOMEN AND PELVIS WITHOUT CONTRAST  TECHNIQUE: Multidetector CT imaging of the chest, abdomen and pelvis was performed following the standard protocol without IV contrast.  COMPARISON:  No priors.  FINDINGS: CT CHEST FINDINGS  Mediastinum: Numerous borderline enlarged and mildly enlarged mediastinal lymph nodes are noted, largest of which include a 12 mm low left paratracheal lymph node and a 11 mm lymph node in the superior mediastinum immediately to the right of the innominate artery. Enlarged retrocrural and posterior mediastinal lymph nodes measuring up to 15 mm in short axis. Heart size is normal. Small amount of pericardial fluid and/or thickening, unlikely to be of hemodynamic significance at this time. No associated pericardial calcification. Esophagus is unremarkable in appearance.  Lungs/Pleura: No acute  consolidative airspace disease. No pleural effusions. No suspicious appearing pulmonary nodules or masses.  Musculoskeletal: There are no aggressive appearing lytic or blastic lesions noted in the visualized portions of the skeleton.  CT ABDOMEN AND PELVIS FINDINGS  Abdomen/Pelvis: The spleen is massively enlarged measuring 14.3 x 7.5 x 24.0 cm (estimated splenic volume of 1,287 mL), and the spleen is diffusely heterogeneous in attenuation/enhancement, suggesting diffuse infiltration by a innumerable lesions, presumably lymphomatous. There is extensive lymphadenopathy throughout the retroperitoneum and upper abdomen. Numerous enlarged lymph nodes in the celiac axis are noted, best demonstrated on image 58 of series 2 retroperitoneal lymph nodes measuring up to 21 mm in short axis are noted in the left para-aortic station anterior to the left renal hilum. This retroperitoneal lymphadenopathy extends to the pelvis where there are also bilateral external iliac lymph nodes (left greater than right), largest of which on the left side measures up to 15 mm.  The appearance of the liver, gallbladder, pancreas and bilateral adrenal glands is unremarkable. There are numerous nonobstructive calculi within the collecting systems of the kidneys bilaterally, largest of which measures 11 mm in the upper pole collecting system of the right kidney. In addition, there appear to be 2 calculi in the proximal right ureter just beyond the right ureteropelvic junction, largest of which measures 1 cm. Mild fullness in the renal collecting systems bilaterally, but no frank hydroureteronephrosis or perinephric stranding to suggest significant urinary tract obstruction at this time. No bladder calculi identified.  Trace volume of ascites in the low anatomic pelvis. No pneumoperitoneum. No pathologic distention of small bowel. Prostate gland and urinary bladder are unremarkable in appearance.  Musculoskeletal: There are no aggressive appearing  lytic or blastic lesions noted in the visualized portions of the skeleton.  IMPRESSION: 1. Extensive lymphadenopathy in the chest, abdomen and pelvis, with massive splenomegaly, as detailed above, highly concerning for lymphoma. 2. Numerous nonobstructive calculi within the collecting systems of the kidneys bilaterally, largest of which on the right side measure up to 11 mm. In addition, there are 2 calculi in the proximal right ureter just distal to the right ureteropelvic junction, largest of which measures up to 1 cm. Despite this, there does not appear to be significant urinary tract obstruction at this time. 3. Trace volume of ascites. 4. Small amount of pericardial fluid and/or thickening,  unlikely to be of hemodynamic significance at this time.   Electronically Signed   By: Vinnie Langton M.D.   On: 08/04/2013 14:42   Ct Abdomen Pelvis W Contrast  08/04/2013   CLINICAL DATA:  Anemia, fever and weight loss. Evaluate for potential lymphoma. HIV-positive patient.  EXAM: CT CHEST, ABDOMEN AND PELVIS WITHOUT CONTRAST  TECHNIQUE: Multidetector CT imaging of the chest, abdomen and pelvis was performed following the standard protocol without IV contrast.  COMPARISON:  No priors.  FINDINGS: CT CHEST FINDINGS  Mediastinum: Numerous borderline enlarged and mildly enlarged mediastinal lymph nodes are noted, largest of which include a 12 mm low left paratracheal lymph node and a 11 mm lymph node in the superior mediastinum immediately to the right of the innominate artery. Enlarged retrocrural and posterior mediastinal lymph nodes measuring up to 15 mm in short axis. Heart size is normal. Small amount of pericardial fluid and/or thickening, unlikely to be of hemodynamic significance at this time. No associated pericardial calcification. Esophagus is unremarkable in appearance.  Lungs/Pleura: No acute consolidative airspace disease. No pleural effusions. No suspicious appearing pulmonary nodules or masses.   Musculoskeletal: There are no aggressive appearing lytic or blastic lesions noted in the visualized portions of the skeleton.  CT ABDOMEN AND PELVIS FINDINGS  Abdomen/Pelvis: The spleen is massively enlarged measuring 14.3 x 7.5 x 24.0 cm (estimated splenic volume of 1,287 mL), and the spleen is diffusely heterogeneous in attenuation/enhancement, suggesting diffuse infiltration by a innumerable lesions, presumably lymphomatous. There is extensive lymphadenopathy throughout the retroperitoneum and upper abdomen. Numerous enlarged lymph nodes in the celiac axis are noted, best demonstrated on image 58 of series 2 retroperitoneal lymph nodes measuring up to 21 mm in short axis are noted in the left para-aortic station anterior to the left renal hilum. This retroperitoneal lymphadenopathy extends to the pelvis where there are also bilateral external iliac lymph nodes (left greater than right), largest of which on the left side measures up to 15 mm.  The appearance of the liver, gallbladder, pancreas and bilateral adrenal glands is unremarkable. There are numerous nonobstructive calculi within the collecting systems of the kidneys bilaterally, largest of which measures 11 mm in the upper pole collecting system of the right kidney. In addition, there appear to be 2 calculi in the proximal right ureter just beyond the right ureteropelvic junction, largest of which measures 1 cm. Mild fullness in the renal collecting systems bilaterally, but no frank hydroureteronephrosis or perinephric stranding to suggest significant urinary tract obstruction at this time. No bladder calculi identified.  Trace volume of ascites in the low anatomic pelvis. No pneumoperitoneum. No pathologic distention of small bowel. Prostate gland and urinary bladder are unremarkable in appearance.  Musculoskeletal: There are no aggressive appearing lytic or blastic lesions noted in the visualized portions of the skeleton.  IMPRESSION: 1. Extensive  lymphadenopathy in the chest, abdomen and pelvis, with massive splenomegaly, as detailed above, highly concerning for lymphoma. 2. Numerous nonobstructive calculi within the collecting systems of the kidneys bilaterally, largest of which on the right side measure up to 11 mm. In addition, there are 2 calculi in the proximal right ureter just distal to the right ureteropelvic junction, largest of which measures up to 1 cm. Despite this, there does not appear to be significant urinary tract obstruction at this time. 3. Trace volume of ascites. 4. Small amount of pericardial fluid and/or thickening, unlikely to be of hemodynamic significance at this time.   Electronically Signed   By: Quillian Quince  Entrikin M.D.   On: 08/04/2013 14:42    CBC  Recent Labs Lab 08/02/13 1620 08/03/13 0630 08/04/13 0339 08/06/13 0451 08/07/13 0430  WBC 4.6 3.2* 3.3* 4.3 4.0  HGB 5.6* 7.5* 7.1* 7.3* 7.8*  HCT 19.3* 23.6* 22.5* 23.5* 25.3*  PLT 103* 77* 86* 87* 78*  MCV 99.5 94.0 94.9 97.5 98.4  MCH 28.9 29.9 30.0 30.3 30.4  MCHC 29.0* 31.8 31.6 31.1 30.8  RDW 26.1* 22.7* 24.0* 24.7* 23.1*  LYMPHSABS 2.5  --   --   --   --   MONOABS 0.9  --   --   --   --   EOSABS 0.0  --   --   --   --   BASOSABS 0.0  --   --   --   --     Chemistries   Recent Labs Lab 08/02/13 1620 08/03/13 0710 08/04/13 0339  NA 143 145 141  K 3.8 3.8 3.6*  CL 109 112 111  CO2 23 22 20   GLUCOSE 100* 70 84  BUN 18 15 12   CREATININE 0.62 0.60 0.71  CALCIUM 8.2* 8.1* 7.5*  AST 17  --   --   ALT 7  --   --   ALKPHOS 123*  --   --   BILITOT 0.3  --   --    ------------------------------------------------------------------------------------------------------------------ estimated creatinine clearance is 92.2 ml/min (by C-G formula based on Cr of 0.71). ------------------------------------------------------------------------------------------------------------------ No results found for this basename: HGBA1C,  in the last 72  hours ------------------------------------------------------------------------------------------------------------------ No results found for this basename: CHOL, HDL, LDLCALC, TRIG, CHOLHDL, LDLDIRECT,  in the last 72 hours ------------------------------------------------------------------------------------------------------------------ No results found for this basename: TSH, T4TOTAL, FREET3, T3FREE, THYROIDAB,  in the last 72 hours ------------------------------------------------------------------------------------------------------------------ No results found for this basename: VITAMINB12, FOLATE, FERRITIN, TIBC, IRON, RETICCTPCT,  in the last 72 hours  Coagulation profile No results found for this basename: INR, PROTIME,  in the last 168 hours  No results found for this basename: DDIMER,  in the last 72 hours  Cardiac Enzymes  Recent Labs Lab 08/03/13 0540 08/03/13 1005 08/03/13 1608  TROPONINI <0.30 <0.30 <0.30   ------------------------------------------------------------------------------------------------------------------ No components found with this basename: POCBNP,     Rajeev Escue D.O. on 08/07/2013 at 12:26 PM  Between 7am to 7pm - Pager - [phone]  After 7pm go to [email or web address] - password [email or web address]  And look for the night coverage person covering for me after hours  [facility] Office  [phone]

## 2013-08-07 NOTE — Progress Notes (Signed)
Events noted and labs reviewed. Counts relatively stable including WBC and platelets.  Hgb still around 7 or so.  Lymph node biopsy scheduled for Friday.  I agree with the current management and care and will follow up on the results of his biopsy as soon it becomes available.

## 2013-08-07 NOTE — Progress Notes (Signed)
Procedure(s) (LRB): SUPRACLAVICAL LYMPH NODES BIOPSY (Right) Subjective:  No complaints  Objective: Vital signs in last 24 hours: Temp:  [97.6 F (36.4 C)-98.7 F (37.1 C)] 97.6 F (36.4 C) (01/15 1400) Pulse Rate:  [67-120] 106 (01/15 1400) Cardiac Rhythm:  [-] Sinus tachycardia (01/15 0238) Resp:  [16-18] 17 (01/15 1400) BP: (94-109)/(53-67) 100/60 mmHg (01/15 1400) SpO2:  [100 %] 100 % (01/15 1400)  Hemodynamic parameters for last 24 hours:    Intake/Output from previous day: 01/14 0701 - 01/15 0700 In: 2586 [I.V.:2261; Blood:325] Out: 0626 [Urine:4550] Intake/Output this shift: Total I/O In: -  Out: 2400 [Urine:2400]  General appearance: alert and cooperative Heart: regular rate and rhythm, S1, S2 normal, no murmur, click, rub or gallop Lungs: clear to auscultation bilaterally  Lab Results:  Recent Labs  08/06/13 0451 08/07/13 0430  WBC 4.3 4.0  HGB 7.3* 7.8*  HCT 23.5* 25.3*  PLT 87* 78*   BMET: No results found for this basename: NA, K, CL, CO2, GLUCOSE, BUN, CREATININE, CALCIUM,  in the last 72 hours  PT/INR: No results found for this basename: LABPROT, INR,  in the last 72 hours ABG    Component Value Date/Time   TCO2 28 02/20/2012 1421   CBG (last 3)  No results found for this basename: GLUCAP,  in the last 72 hours  Assessment/Plan:  Stable. Plan right supraclavicular lymph node biopsy tomorrow afternoon. I discussed the procedure, alternatives, benefits, and risks with the patient and his mother and they agree to proceed.  LOS: 5 days    BARTLE,BRYAN K 08/07/2013

## 2013-08-07 NOTE — Progress Notes (Signed)
NUTRITION FOLLOW UP  Intervention:   Continue Ensure Complete TID, each supplement provides 350 kcal and 13 grams of protein Provide Snacks BID  Nutrition Dx:   Malnutrition related to chronic illness/HIV as evidenced by severe weight loss and fat/muscle wasting; ongoing, improving  Goal:   Pt meal completion >/= 90%; being met  Monitor:   Weight trends; now new weight since 1/11 Labs; low hemoglobin, low calcium Oral intake; 100% of meals and supplements  Assessment:   RD consulted for calorie count Pt reports eating 100% of meals yesterday and drinking Ensure TID  Breakfast: 1099 kcal, 38 grams protein Lunch: 890 kcal, 28 grams protein Dinner: 573 kcal, 31 grams protein Supplements: 1050 kcal, 39 grams protein  Total intake: 3612 kcal (190% of minimum estimated needs)  136 grams protein (189% of minimum estimated needs)  Pt reports great appetite now. Pt is exceeding his estimated energy and protein needs. Encouraged pt to continue eating well and continue Ensure supplements to promote weight gain.  Will not continue calorie count as pt is eating very well.   Height: Ht Readings from Last 1 Encounters:  08/03/13 6' 1"  (1.854 m)    Weight Status:   Wt Readings from Last 1 Encounters:  08/03/13 106 lb 7.7 oz (48.3 kg)    Re-estimated needs:  Kcal: 1900-2200 kcals/day  Protein: 72-85 grams/day  Fluid: >1.5 L/day  Skin: intact  Diet Order: General   Intake/Output Summary (Last 24 hours) at 08/07/13 1036 Last data filed at 08/07/13 0941  Gross per 24 hour  Intake   2586 ml  Output   5150 ml  Net  -2564 ml    Last BM: 1/13   Labs:   Recent Labs Lab 08/02/13 1620 08/03/13 0710 08/04/13 0339  NA 143 145 141  K 3.8 3.8 3.6*  CL 109 112 111  CO2 23 22 20   BUN 18 15 12   CREATININE 0.62 0.60 0.71  CALCIUM 8.2* 8.1* 7.5*  GLUCOSE 100* 70 84    CBG (last 3)  No results found for this basename: GLUCAP,  in the last 72 hours  Scheduled Meds: .  efavirenz-emtricitabine-tenofovir  1 tablet Oral QHS  . feeding supplement (ENSURE COMPLETE)  237 mL Oral TID WC  . ferrous sulfate  325 mg Oral BID WC  . pantoprazole  40 mg Oral BID AC    Continuous Infusions: . sodium chloride 75 mL/hr at 08/05/13 1800  . sodium chloride 125 mL/hr at 08/06/13 1947    Pryor Ochoa RD, LDN Inpatient Clinical Dietitian Pager: [phone] After Hours Pager: [phone]

## 2013-08-08 ENCOUNTER — Encounter (HOSPITAL_COMMUNITY)
Admission: EM | Disposition: A | Discharge status: Home / Self Care | Payer: Self-pay | Source: Home / Self Care | Attending: Internal Medicine

## 2013-08-08 ENCOUNTER — Encounter (HOSPITAL_COMMUNITY): Payer: Self-pay | Admitting: Critical Care Medicine

## 2013-08-08 ENCOUNTER — Inpatient Hospital Stay (HOSPITAL_COMMUNITY): Payer: Medicare Other | Admitting: Anesthesiology

## 2013-08-08 ENCOUNTER — Encounter (HOSPITAL_COMMUNITY): Payer: Medicare Other | Admitting: Anesthesiology

## 2013-08-08 DIAGNOSIS — R599 Enlarged lymph nodes, unspecified: Secondary | ICD-10-CM

## 2013-08-08 HISTORY — PX: SUPRACLAVICAL NODE BIOPSY: SHX5165

## 2013-08-08 LAB — CBC
HCT: 24.7 % — ABNORMAL LOW (ref 39.0–52.0)
HEMOGLOBIN: 7.6 g/dL — AB (ref 13.0–17.0)
MCH: 30.3 pg (ref 26.0–34.0)
MCHC: 30.8 g/dL (ref 30.0–36.0)
MCV: 98.4 fL (ref 78.0–100.0)
Platelets: 78 10*3/uL — ABNORMAL LOW (ref 150–400)
RBC: 2.51 MIL/uL — AB (ref 4.22–5.81)
RDW: 22.6 % — ABNORMAL HIGH (ref 11.5–15.5)
WBC: 4.3 10*3/uL (ref 4.0–10.5)

## 2013-08-08 LAB — STOOL CULTURE

## 2013-08-08 LAB — SURGICAL PCR SCREEN
MRSA, PCR: NEGATIVE
Staphylococcus aureus: NEGATIVE

## 2013-08-08 SURGERY — BIOPSY, LYMPH NODE, SUPRACLAVICULAR
Anesthesia: General | Site: Neck | Laterality: Right

## 2013-08-08 MED ORDER — 0.9 % SODIUM CHLORIDE (POUR BTL) OPTIME
TOPICAL | Status: DC | PRN
  Administered 2013-08-08: 1000 mL

## 2013-08-08 MED ORDER — PROPOFOL 10 MG/ML IV BOLUS
INTRAVENOUS | Status: DC | PRN
  Administered 2013-08-08: 30 mg via INTRAVENOUS
  Administered 2013-08-08: 150 mg via INTRAVENOUS

## 2013-08-08 MED ORDER — PHENYLEPHRINE HCL 10 MG/ML IJ SOLN
INTRAMUSCULAR | Status: DC | PRN
  Administered 2013-08-08: 80 ug via INTRAVENOUS
  Administered 2013-08-08: 120 ug via INTRAVENOUS
  Administered 2013-08-08 (×4): 80 ug via INTRAVENOUS

## 2013-08-08 MED ORDER — LIDOCAINE HCL (CARDIAC) 20 MG/ML IV SOLN
INTRAVENOUS | Status: DC | PRN
  Administered 2013-08-08: 60 mg via INTRAVENOUS

## 2013-08-08 MED ORDER — HYDROCODONE-ACETAMINOPHEN 5-325 MG PO TABS
ORAL_TABLET | ORAL | Status: AC
  Filled 2013-08-08: qty 2

## 2013-08-08 MED ORDER — DEXAMETHASONE SODIUM PHOSPHATE 4 MG/ML IJ SOLN
INTRAMUSCULAR | Status: DC | PRN
  Administered 2013-08-08: 4 mg via INTRAVENOUS

## 2013-08-08 MED ORDER — LACTATED RINGERS IV SOLN
INTRAVENOUS | Status: DC | PRN
  Administered 2013-08-08: 13:00:00 via INTRAVENOUS

## 2013-08-08 MED ORDER — MIDAZOLAM HCL 5 MG/5ML IJ SOLN
INTRAMUSCULAR | Status: DC | PRN
  Administered 2013-08-08: 2 mg via INTRAVENOUS

## 2013-08-08 MED ORDER — ONDANSETRON HCL 4 MG/2ML IJ SOLN
INTRAMUSCULAR | Status: DC | PRN
  Administered 2013-08-08: 4 mg via INTRAVENOUS

## 2013-08-08 MED ORDER — FENTANYL CITRATE 0.05 MG/ML IJ SOLN
INTRAMUSCULAR | Status: DC | PRN
  Administered 2013-08-08 (×3): 50 ug via INTRAVENOUS

## 2013-08-08 MED ORDER — LACTATED RINGERS IV SOLN
INTRAVENOUS | Status: DC
  Administered 2013-08-08 – 2013-08-11 (×4): via INTRAVENOUS

## 2013-08-08 SURGICAL SUPPLY — 37 items
CANISTER SUCTION 2500CC (MISCELLANEOUS) ×2 IMPLANT
CLIP TI WIDE RED SMALL 6 (CLIP) ×4 IMPLANT
CONT SPEC 4OZ CLIKSEAL STRL BL (MISCELLANEOUS) ×2 IMPLANT
COVER SURGICAL LIGHT HANDLE (MISCELLANEOUS) ×4 IMPLANT
DERMABOND ADVANCED (GAUZE/BANDAGES/DRESSINGS) ×1
DERMABOND ADVANCED .7 DNX12 (GAUZE/BANDAGES/DRESSINGS) ×1 IMPLANT
DRAPE LAPAROTOMY T 102X78X121 (DRAPES) ×2 IMPLANT
ELECT CAUTERY BLADE 6.4 (BLADE) ×2 IMPLANT
ELECT REM PT RETURN 9FT ADLT (ELECTROSURGICAL) ×2
ELECTRODE REM PT RTRN 9FT ADLT (ELECTROSURGICAL) ×1 IMPLANT
GLOVE BIOGEL PI IND STRL 6.5 (GLOVE) ×1 IMPLANT
GLOVE BIOGEL PI IND STRL 7.0 (GLOVE) ×2 IMPLANT
GLOVE BIOGEL PI INDICATOR 6.5 (GLOVE) ×1
GLOVE BIOGEL PI INDICATOR 7.0 (GLOVE) ×2
GLOVE EUDERMIC 7 POWDERFREE (GLOVE) ×2 IMPLANT
GLOVE SURG SS PI 6.5 STRL IVOR (GLOVE) ×2 IMPLANT
GOWN PREVENTION PLUS XLARGE (GOWN DISPOSABLE) ×6 IMPLANT
GOWN STRL NON-REIN LRG LVL3 (GOWN DISPOSABLE) ×2 IMPLANT
KIT BASIN OR (CUSTOM PROCEDURE TRAY) ×2 IMPLANT
KIT ROOM TURNOVER OR (KITS) ×2 IMPLANT
NEEDLE 22X1 1/2 (OR ONLY) (NEEDLE) IMPLANT
NS IRRIG 1000ML POUR BTL (IV SOLUTION) ×2 IMPLANT
PACK GENERAL/GYN (CUSTOM PROCEDURE TRAY) ×2 IMPLANT
PAD ARMBOARD 7.5X6 YLW CONV (MISCELLANEOUS) ×4 IMPLANT
SPONGE GAUZE 4X4 12PLY (GAUZE/BANDAGES/DRESSINGS) ×2 IMPLANT
SPONGE INTESTINAL PEANUT (DISPOSABLE) ×2 IMPLANT
SUT SILK 2 0 SH (SUTURE) ×2 IMPLANT
SUT SILK 2 0 TIES 10X30 (SUTURE) ×2 IMPLANT
SUT VIC AB 2-0 CT1 27 (SUTURE) ×1
SUT VIC AB 2-0 CT1 TAPERPNT 27 (SUTURE) ×1 IMPLANT
SUT VIC AB 3-0 SH 27 (SUTURE) ×1
SUT VIC AB 3-0 SH 27X BRD (SUTURE) ×1 IMPLANT
SUT VIC AB 3-0 X1 27 (SUTURE) ×2 IMPLANT
SYR CONTROL 10ML LL (SYRINGE) IMPLANT
TOWEL OR 17X24 6PK STRL BLUE (TOWEL DISPOSABLE) ×4 IMPLANT
TOWEL OR 17X26 10 PK STRL BLUE (TOWEL DISPOSABLE) ×2 IMPLANT
WATER STERILE IRR 1000ML POUR (IV SOLUTION) ×2 IMPLANT

## 2013-08-08 NOTE — Anesthesia Preprocedure Evaluation (Addendum)
Anesthesia Evaluation  Patient identified by MRN, date of birth, ID band Patient awake    Reviewed: Allergy & Precautions, H&P , Patient's Chart, lab work & pertinent test results  History of Anesthesia Complications Negative for: history of anesthetic complications  Airway Mallampati: I TM Distance: >3 FB Neck ROM: Full    Dental  (+) Teeth Intact and Dental Advisory Given   Pulmonary Current Smoker,  breath sounds clear to auscultation        Cardiovascular Rhythm:Regular Rate:Tachycardia     Neuro/Psych    GI/Hepatic Severe malnutriton   Endo/Other    Renal/GU      Musculoskeletal   Abdominal   Peds  Hematology  (+) anemia , HIV,   Anesthesia Other Findings   Reproductive/Obstetrics                          Anesthesia Physical Anesthesia Plan  ASA: IV  Anesthesia Plan: General   Post-op Pain Management:    Induction: Intravenous  Airway Management Planned: LMA and Oral ETT  Additional Equipment:   Intra-op Plan:   Post-operative Plan: Possible Post-op intubation/ventilation  Informed Consent: I have reviewed the patients History and Physical, chart, labs and discussed the procedure including the risks, benefits and alternatives for the proposed anesthesia with the patient or authorized representative who has indicated his/her understanding and acceptance.   Dental advisory given  Plan Discussed with: CRNA and Surgeon  Anesthesia Plan Comments:         Anesthesia Quick Evaluation

## 2013-08-08 NOTE — Preoperative (Signed)
Beta Blockers   Reason not to administer Beta Blockers:Not Applicable, atenolol d/c'd this admission due to hypotension

## 2013-08-08 NOTE — Transfer of Care (Signed)
Immediate Anesthesia Transfer of Care Note  Patient: Thomas [name]  Procedure(s) Performed: Procedure(s): SUPRACLAVICULAR LYMPH NODE BIOPSY (Right)  Patient Location: PACU  Anesthesia Type:General  Level of Consciousness: alert and oriented  Airway & Oxygen Therapy: Patient Spontanous Breathing and Patient connected to nasal cannula oxygen  Post-op Assessment: Report given to PACU RN, Post -op Vital signs reviewed and stable and Patient moving all extremities X 4  Post vital signs: Reviewed and stable  Complications: No apparent anesthesia complications

## 2013-08-08 NOTE — Op Note (Signed)
      [address]       [place] [address]             [id number]      CARDIOVASCULAR SURGERY OPERATIVE NOTE  08/08/2013 Thomas [name] [id number]  Surgeon:  Gaye Pollack, MD  First Assistant: Waldron Labs, RNFA   Preoperative Diagnosis:  Supraclavicular adenopathy   Postoperative Diagnosis:  Same   Procedure:  1.  Excisional biopsy of right supraclavicular lymph node  Anesthesia:  General LMA   Clinical History/Surgical Indication:  The patient is a 33 year old gentleman with a history of HIV diagnosed in October 2013 who has been treated with Atripla since then. He presented with altered mental status, anxiety, diaphoresis, shaking, and a history of significant weight loss over the past few weeks. He has also had frequent black stools and had a Hgb of 5.6 in the ER. He was noted to have significant bilateral supraclavicular adenopathy on exam and CT of chest, abdomen, and pelvis shows significant mediastinal, retrocrural, and abdominal/retroperitoneal lymphadenopathy with marked splenic enlargement. He has HIV disease with anemia, leukopenia, and thrombocytopenia, marked weight loss and diffuse lymphadenopathy. I have been consulted to get a lymph node biopsy to aid with further diagnosis and management, rule out lymphoma. I think the right supraclavicular lymph nodes are the most amenable to excisional biopsy. I discussed the procedure, indications, benefits, and risks with the patient and his mother. They understand and agree to proceed.   Preparation:  The patient was seen in the preoperative holding area and the correct patient, correct operation and correct side were confirmed with the patient after reviewing the medical record and examining him. The right supraclavicular fossa was signed by me. The consent was signed by me. Preoperative antibiotics were given. The patient was taken back to the operating room and positioned supine on the operating  room table. After being placed under general anesthesia by the anesthesia team the neck and chest were prepped with betadine soap and solution and draped in the usual sterile manner. A surgical time-out was taken and the correct patient and operative procedure, correct side were confirmed with the nursing and anesthesia staff.   Supraclavicular lymph node biopsy:  A 2 cm curvilinear incision was made over the right lower neck along the natural skin line. The platysma muscle was divided and mobilized slightly off the sternocleidomastoid muscle. This muscle was retracted posteriorly to expose a large lymph node. The loose attachments to the lymph node were divided with cautery and the blood supply ligated with a 2-0 silk suture. The entire lymph node was removed. It measured about 2 cm in diameter. It was sent to pathology. There was complete hemostasis. The platysma muscle was approximated with 3-0 vicryl suture. The skin was closed with 3-0 vicryl subcuticular suture and Dermabond was applied. The sponge, needle and instrument counts were correct according to the nurses. The patient was awakened, extubated and transported to the PACU in satisfactory condition.

## 2013-08-08 NOTE — Progress Notes (Signed)
Called Thomas [name] about Thomas [name] BP=84/50 HR=96. He is not symptomatic. No orders where giving at present time. Will continue checking vitals.

## 2013-08-08 NOTE — Progress Notes (Signed)
Events noted. No major clinical changes.  Biopsy scheduled this afternoon and will awaite the the results of the biopsy. I prefer he stays inpatient till we get a preliminary results on his lymphoma as it might by high grade and might require immediate treatment.  I will be out of the office next week.  Dr. Marin Olp will see him on Monday and beyond if needed.

## 2013-08-08 NOTE — Progress Notes (Signed)
    [facility] [name] for Infectious Disease    Date of Admission:  08/02/2013   Total days of antibiotics 0           ID: Thomas [name] is a 33 y.o. male with HIV, CD 4 count of 660/VL 289 (previously 23 copies) on atripla present with significant weight loss anemia, and LAD concern for malignancy. Imaging suggestive of numerous lymphadenopathy to chest and abd and has massive splenomegaly  Principal Problem:   Anemia, unspecified Active Problems:   Anemia   Iron deficiency anemia, unspecified   Protein-calorie malnutrition, severe    Subjective: Afebrile. Awaiting supraclavicular LN biopsy today  Medications:  .  ceFAZolin (ANCEF) IV  1 g Intravenous On Call to OR  . efavirenz-emtricitabine-tenofovir  1 tablet Oral QHS  . feeding supplement (ENSURE COMPLETE)  237 mL Oral TID BM  . ferrous sulfate  325 mg Oral BID WC  . pantoprazole  40 mg Oral BID AC    Objective: Vital signs in last 24 hours: Temp:  [97.3 F (36.3 C)-97.6 F (36.4 C)] 97.4 F (36.3 C) (01/16 1155) Pulse Rate:  [62-106] 94 (01/16 1155) Resp:  [17-19] 19 (01/16 1155) BP: (84-100)/(49-63) 89/63 mmHg (01/16 1155) SpO2:  [100 %] 100 % (01/16 1155)  Did not exam since he was heading to OR  Lab Results  Recent Labs  08/07/13 0430 08/08/13 0307  WBC 4.0 4.3  HGB 7.8* 7.6*  HCT 25.3* 24.7*   Liver Panel No results found for this basename: PROT, ALBUMIN, AST, ALT, ALKPHOS, BILITOT, BILIDIR, IBILI,  in the last 72 hours Microbiology: 1/11 blood cx NGTD 1/11 ur cx 100,000 CoNS, but patient asymptomatic 1/11 stool cx pending 1/12 cdiff negative Studies/Results: No results found.   Assessment/Plan: Lymphadenopathy = awaiting biopsy and path to confirm suspicion for high grade lymphoma. Recommend to send some tissue for aerobic, fungal, and AFB culture. Will check for EBV viral load to see if related to probable lymphoma  hiv = continue with atripla. Patient reports being very adherent to his meds.  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 CD 4 count 660, no risk for OI. If he is to start chemotherapy, we will recommend to change to different regimen to minimize drug interaction  Cachexia/weight loss = probably due to malignancy. Would do calorie count. Consider adding megace as appetite stimulant  Dr. Lucianne Lei [name] available for questions over the weekend. I will see him back on Monday  Thomas [name], [facility] Cell: [phone] Pager: [phone]  08/08/2013, 12:04 PM

## 2013-08-08 NOTE — Progress Notes (Signed)
[facility]                                                                   Progress Note  Patient Demographics  Thomas [name], is a 33 y.o. male, DOB - [birth year], [id number]  Admit date - 08/02/2013   Admitting Physician Merton Border, MD  Outpatient Primary MD for the patient is Philis Fendt, MD  LOS - 6   Chief Complaint  Patient presents with  . Weight Loss    onset 2 weeks unknown weight loss, decreased appetite  . Fatigue    x 1 1/2 month   . Abdominal Pain    onset several days suprapubic pain  . Diarrhea    onset 2 weeks diarreha x 1-2 per day         Assessment & Plan   Symptomatic anemia, iron deficiency -No longer symptomatic at this time -H/H 7.6/24.7, will continue to monitor -Patient became tachycardic and hypotensive and has received 3 units since admission -FOBT was negative, GI was consulted and did recommend endoscopy at this time -Hematology also consulted and recommended lymph node biopsy which will be done Friday by Dr. Cyndia Bent (thoracic surg) -Patient has been followed by Dr. Bobbye Charleston data in the past and was seen in December 2014 and was placed on iron supplementation, with plans for IV iron  Hypotension with hypovolemia -Infectious versus hypovolemia (from anemia) versus medication induced -To date, urine and blood cultures as well as stool cultures negative -C. difficile PCR negative -Atenolol discontinued on 08/02/2013 -TSH as well as cortisol levels were in normal limits  HIV/AIDS -Continue atripla -Infectious disease also following -CD4 count 660  Extensive lymphadenopathy and massive splenomegaly -CT scan of the abdomen and chest with extensive lymphadenopathy -ENT, Dr. Janace Hoard consulted for possible lymph node biopsy, , however verbal altercation with patient's mother -Thoracic surgery consulted, Dr. Cyndia Bent, and planned biopsy for today  Altered mental status -Resolved, patient is back to his baseline -CT of the head was  negative   Protein malnutrition -Consulted nutrition -Continue feeding supplementation  Code Status: Full Family Communication: Mother at bedside Disposition Plan: Admitted  Time Spent in minutes   25 minutes  Procedures none  Consults   Infectious disease Oncology Thoracic surgery Interventional radiology  DVT Prophylaxis  TED hose  Lab Results  Component Value Date   PLT 78* 08/08/2013    Medications  Scheduled Meds: .  ceFAZolin (ANCEF) IV  1 g Intravenous On Call to OR  . efavirenz-emtricitabine-tenofovir  1 tablet Oral QHS  . feeding supplement (ENSURE COMPLETE)  237 mL Oral TID BM  . ferrous sulfate  325 mg Oral BID WC  . pantoprazole  40 mg Oral BID AC   Continuous Infusions: . sodium chloride 75 mL/hr at 08/05/13 1800  . sodium chloride 125 mL/hr at 08/08/13 0841   PRN Meds:.acetaminophen, acetaminophen, HYDROcodone-acetaminophen, morphine injection, ondansetron (ZOFRAN) IV, ondansetron, zolpidem  Antibiotics    Anti-infectives   Start     Dose/Rate Route Frequency Ordered Stop   08/08/13 0600  ceFAZolin (ANCEF) IVPB 1 g/50 mL premix     1 g 100 mL/hr over 30 Minutes Intravenous On call to O.R. 08/07/13 1750 08/09/13 0559   08/02/13 2200  efavirenz-emtricitabine-tenofovir (ATRIPLA) 600-200-300 MG per  tablet 1 tablet     1 tablet Oral Daily at bedtime 08/02/13 1835          Subjective:   Brevon Siqueira seen and examined today.  Patient has no complaints today, however states he is very sleepy.    Objective:   Filed Vitals:   08/07/13 1400 08/07/13 2101 08/08/13 0525 08/08/13 0610  BP: 100/60 99/62 85/49  84/50  Pulse: 106 62 96   Temp: 97.6 F (36.4 C) 97.4 F (36.3 C) 97.3 F (36.3 C)   TempSrc: Oral Oral Oral   Resp: 17 18 18    Height:      Weight:      SpO2: 100% 100% 100%     Wt Readings from Last 3 Encounters:  08/03/13 48.3 kg (106 lb 7.7 oz)  08/03/13 48.3 kg (106 lb 7.7 oz)  07/08/13 54.432 kg (120 lb)     Intake/Output  Summary (Last 24 hours) at 08/08/13 0848 Last data filed at 08/08/13 0600  Gross per 24 hour  Intake   4464 ml  Output   4800 ml  Net   -336 ml    Exam  General: Well developed, malnourished, NAD, appears stated age  HEENT: NCAT, PERRLA, EOMI, Anicteic Sclera, mucous membranes moist.  Neck: Supple, no JVD, bilateral supraclavicular lymph nodes present  Cardiovascular: S1 S2 auscultated, no rubs, murmurs or gallops. Regular rate and rhythm.  Respiratory: Clear to auscultation bilaterally with equal chest rise  Abdomen: Soft, nontender, nondistended, + bowel sounds  Extremities: warm dry without cyanosis clubbing or edema  Neuro: AAOx3, cranial nerves grossly intact.   Skin: Without rashes exudates or nodules  Psych: Normal affect and demeanor with intact judgement and insight   Data Review   Micro Results Recent Results (from the past 240 hour(s))  CMV CULTURE CMVC     Status: None   Collection Time    08/02/13  9:12 PM      Result Value Range Status   Specimen Description NASAL SWAB   Final   Special Requests NONE   Final   Culture     Final   Value: No Cytomegalovirus identified in cell culture                                                                A negative result does not exclude the possibility of CMV infection;inappropriate specimen collection,storage and transport may lead to false      negative results.     Performed at [facility]   Report Status 08/07/2013 FINAL   Final  GC/CHLAMYDIA PROBE AMP     Status: None   Collection Time    08/03/13  2:04 PM      Result Value Range Status   CT Probe RNA NEGATIVE  NEGATIVE Final   GC Probe RNA NEGATIVE  NEGATIVE Final   Comment: (NOTE)                                                                                               **  Normal Reference Range: Negative**          Assay performed using the Gen-Probe APTIMA COMBO2 (R) Assay.     Acceptable specimen types for this assay include APTIMA  Swabs (Unisex,     endocervical, urethral, or vaginal), first void urine, and ThinPrep     liquid based cytology samples.     Performed at [place]     Status: None   Collection Time    08/03/13  2:04 PM      Result Value Range Status   Specimen Description URINE, RANDOM   Final   Special Requests NONE   Final   Culture  Setup Time     Final   Value: 08/03/2013 18:59     Performed at [place]     Final   Value: >=100,000 COLONIES/ML     Performed at [facility]   Culture     Final   Value: STAPHYLOCOCCUS SPECIES (COAGULASE NEGATIVE)     Note: RIFAMPIN AND GENTAMICIN SHOULD NOT BE USED AS SINGLE DRUGS FOR TREATMENT OF STAPH INFECTIONS.     Performed at [facility]   Report Status 08/05/2013 FINAL   Final   Organism ID, Bacteria STAPHYLOCOCCUS SPECIES (COAGULASE NEGATIVE)   Final  CULTURE, BLOOD (ROUTINE X 2)     Status: None   Collection Time    08/03/13  2:14 PM      Result Value Range Status   Specimen Description BLOOD RIGHT ARM   Final   Special Requests BOTTLES DRAWN AEROBIC AND ANAEROBIC 10CC EACH   Final   Culture  Setup Time     Final   Value: 08/03/2013 22:51     Performed at [facility]   Culture     Final   Value:        BLOOD CULTURE RECEIVED NO GROWTH TO DATE CULTURE WILL BE HELD FOR 5 DAYS BEFORE ISSUING A FINAL NEGATIVE REPORT     Performed at [facility]   Report Status PENDING   Incomplete  AFB CULTURE, BLOOD     Status: None   Collection Time    08/03/13  2:14 PM      Result Value Range Status   Specimen Description BLOOD RIGHT ARM   Final   Special Requests BOTTLES DRAWN AEROBIC ONLY 5CC   Final   Culture     Final   Value: CULTURE WILL BE EXAMINED FOR 6 WEEKS BEFORE ISSUING A FINAL REPORT     Performed at [facility]   Report Status PENDING   Incomplete  STOOL CULTURE     Status: None   Collection Time    08/03/13 10:48 PM      Result Value Range Status    Specimen Description STOOL   Final   Special Requests NONE   Final   Culture     Final   Value: NO SALMONELLA, SHIGELLA, CAMPYLOBACTER, YERSINIA, OR E.COLI 0157:H7 ISOLATED     Performed at [facility]   Report Status 08/08/2013 FINAL   Final  CLOSTRIDIUM DIFFICILE BY PCR     Status: None   Collection Time    08/04/13 10:07 PM      Result Value Range Status   C difficile by pcr NEGATIVE  NEGATIVE Final    Radiology Reports Dg Chest 2 View  08/02/2013   CLINICAL DATA:  Shortness of breath and chest pain  EXAM: CHEST  2 VIEW  COMPARISON:  06/09/2008  FINDINGS: The heart size and mediastinal contours are within normal limits. Both lungs are clear. The visualized skeletal structures are unremarkable.  IMPRESSION: No active cardiopulmonary disease.   Electronically Signed   By: Inez Catalina M.D.   On: 08/02/2013 17:20   Ct Head Wo Contrast  08/02/2013   CLINICAL DATA:  Dizziness, headache.  EXAM: CT HEAD WITHOUT CONTRAST  TECHNIQUE: Contiguous axial images were obtained from the base of the skull through the vertex without intravenous contrast.  COMPARISON:  None.  FINDINGS: Bony calvarium appears intact. No mass effect or midline shift is noted. Ventricular size is within normal limits. There is no evidence of mass lesion, hemorrhage or acute infarction.  IMPRESSION: No gross intracranial abnormality seen.   Electronically Signed   By: Sabino Dick M.D.   On: 08/02/2013 17:04   Ct Chest W Contrast  08/04/2013   CLINICAL DATA:  Anemia, fever and weight loss. Evaluate for potential lymphoma. HIV-positive patient.  EXAM: CT CHEST, ABDOMEN AND PELVIS WITHOUT CONTRAST  TECHNIQUE: Multidetector CT imaging of the chest, abdomen and pelvis was performed following the standard protocol without IV contrast.  COMPARISON:  No priors.  FINDINGS: CT CHEST FINDINGS  Mediastinum: Numerous borderline enlarged and mildly enlarged mediastinal lymph nodes are noted, largest of which include a 12 mm low left  paratracheal lymph node and a 11 mm lymph node in the superior mediastinum immediately to the right of the innominate artery. Enlarged retrocrural and posterior mediastinal lymph nodes measuring up to 15 mm in short axis. Heart size is normal. Small amount of pericardial fluid and/or thickening, unlikely to be of hemodynamic significance at this time. No associated pericardial calcification. Esophagus is unremarkable in appearance.  Lungs/Pleura: No acute consolidative airspace disease. No pleural effusions. No suspicious appearing pulmonary nodules or masses.  Musculoskeletal: There are no aggressive appearing lytic or blastic lesions noted in the visualized portions of the skeleton.  CT ABDOMEN AND PELVIS FINDINGS  Abdomen/Pelvis: The spleen is massively enlarged measuring 14.3 x 7.5 x 24.0 cm (estimated splenic volume of 1,287 mL), and the spleen is diffusely heterogeneous in attenuation/enhancement, suggesting diffuse infiltration by a innumerable lesions, presumably lymphomatous. There is extensive lymphadenopathy throughout the retroperitoneum and upper abdomen. Numerous enlarged lymph nodes in the celiac axis are noted, best demonstrated on image 58 of series 2 retroperitoneal lymph nodes measuring up to 21 mm in short axis are noted in the left para-aortic station anterior to the left renal hilum. This retroperitoneal lymphadenopathy extends to the pelvis where there are also bilateral external iliac lymph nodes (left greater than right), largest of which on the left side measures up to 15 mm.  The appearance of the liver, gallbladder, pancreas and bilateral adrenal glands is unremarkable. There are numerous nonobstructive calculi within the collecting systems of the kidneys bilaterally, largest of which measures 11 mm in the upper pole collecting system of the right kidney. In addition, there appear to be 2 calculi in the proximal right ureter just beyond the right ureteropelvic junction, largest of which  measures 1 cm. Mild fullness in the renal collecting systems bilaterally, but no frank hydroureteronephrosis or perinephric stranding to suggest significant urinary tract obstruction at this time. No bladder calculi identified.  Trace volume of ascites in the low anatomic pelvis. No pneumoperitoneum. No pathologic distention of small bowel. Prostate gland and urinary bladder are unremarkable in appearance.  Musculoskeletal: There are no aggressive appearing lytic or blastic lesions  noted in the visualized portions of the skeleton.  IMPRESSION: 1. Extensive lymphadenopathy in the chest, abdomen and pelvis, with massive splenomegaly, as detailed above, highly concerning for lymphoma. 2. Numerous nonobstructive calculi within the collecting systems of the kidneys bilaterally, largest of which on the right side measure up to 11 mm. In addition, there are 2 calculi in the proximal right ureter just distal to the right ureteropelvic junction, largest of which measures up to 1 cm. Despite this, there does not appear to be significant urinary tract obstruction at this time. 3. Trace volume of ascites. 4. Small amount of pericardial fluid and/or thickening, unlikely to be of hemodynamic significance at this time.   Electronically Signed   By: Vinnie Langton M.D.   On: 08/04/2013 14:42   Ct Abdomen Pelvis W Contrast  08/04/2013   CLINICAL DATA:  Anemia, fever and weight loss. Evaluate for potential lymphoma. HIV-positive patient.  EXAM: CT CHEST, ABDOMEN AND PELVIS WITHOUT CONTRAST  TECHNIQUE: Multidetector CT imaging of the chest, abdomen and pelvis was performed following the standard protocol without IV contrast.  COMPARISON:  No priors.  FINDINGS: CT CHEST FINDINGS  Mediastinum: Numerous borderline enlarged and mildly enlarged mediastinal lymph nodes are noted, largest of which include a 12 mm low left paratracheal lymph node and a 11 mm lymph node in the superior mediastinum immediately to the right of the  innominate artery. Enlarged retrocrural and posterior mediastinal lymph nodes measuring up to 15 mm in short axis. Heart size is normal. Small amount of pericardial fluid and/or thickening, unlikely to be of hemodynamic significance at this time. No associated pericardial calcification. Esophagus is unremarkable in appearance.  Lungs/Pleura: No acute consolidative airspace disease. No pleural effusions. No suspicious appearing pulmonary nodules or masses.  Musculoskeletal: There are no aggressive appearing lytic or blastic lesions noted in the visualized portions of the skeleton.  CT ABDOMEN AND PELVIS FINDINGS  Abdomen/Pelvis: The spleen is massively enlarged measuring 14.3 x 7.5 x 24.0 cm (estimated splenic volume of 1,287 mL), and the spleen is diffusely heterogeneous in attenuation/enhancement, suggesting diffuse infiltration by a innumerable lesions, presumably lymphomatous. There is extensive lymphadenopathy throughout the retroperitoneum and upper abdomen. Numerous enlarged lymph nodes in the celiac axis are noted, best demonstrated on image 58 of series 2 retroperitoneal lymph nodes measuring up to 21 mm in short axis are noted in the left para-aortic station anterior to the left renal hilum. This retroperitoneal lymphadenopathy extends to the pelvis where there are also bilateral external iliac lymph nodes (left greater than right), largest of which on the left side measures up to 15 mm.  The appearance of the liver, gallbladder, pancreas and bilateral adrenal glands is unremarkable. There are numerous nonobstructive calculi within the collecting systems of the kidneys bilaterally, largest of which measures 11 mm in the upper pole collecting system of the right kidney. In addition, there appear to be 2 calculi in the proximal right ureter just beyond the right ureteropelvic junction, largest of which measures 1 cm. Mild fullness in the renal collecting systems bilaterally, but no frank hydroureteronephrosis  or perinephric stranding to suggest significant urinary tract obstruction at this time. No bladder calculi identified.  Trace volume of ascites in the low anatomic pelvis. No pneumoperitoneum. No pathologic distention of small bowel. Prostate gland and urinary bladder are unremarkable in appearance.  Musculoskeletal: There are no aggressive appearing lytic or blastic lesions noted in the visualized portions of the skeleton.  IMPRESSION: 1. Extensive lymphadenopathy in the chest, abdomen and  pelvis, with massive splenomegaly, as detailed above, highly concerning for lymphoma. 2. Numerous nonobstructive calculi within the collecting systems of the kidneys bilaterally, largest of which on the right side measure up to 11 mm. In addition, there are 2 calculi in the proximal right ureter just distal to the right ureteropelvic junction, largest of which measures up to 1 cm. Despite this, there does not appear to be significant urinary tract obstruction at this time. 3. Trace volume of ascites. 4. Small amount of pericardial fluid and/or thickening, unlikely to be of hemodynamic significance at this time.   Electronically Signed   By: Vinnie Langton M.D.   On: 08/04/2013 14:42    CBC  Recent Labs Lab 08/02/13 1620 08/03/13 0630 08/04/13 0339 08/06/13 0451 08/07/13 0430 08/08/13 0307  WBC 4.6 3.2* 3.3* 4.3 4.0 4.3  HGB 5.6* 7.5* 7.1* 7.3* 7.8* 7.6*  HCT 19.3* 23.6* 22.5* 23.5* 25.3* 24.7*  PLT 103* 77* 86* 87* 78* 78*  MCV 99.5 94.0 94.9 97.5 98.4 98.4  MCH 28.9 29.9 30.0 30.3 30.4 30.3  MCHC 29.0* 31.8 31.6 31.1 30.8 30.8  RDW 26.1* 22.7* 24.0* 24.7* 23.1* 22.6*  LYMPHSABS 2.5  --   --   --   --   --   MONOABS 0.9  --   --   --   --   --   EOSABS 0.0  --   --   --   --   --   BASOSABS 0.0  --   --   --   --   --     Chemistries   Recent Labs Lab 08/02/13 1620 08/03/13 0710 08/04/13 0339  NA 143 145 141  K 3.8 3.8 3.6*  CL 109 112 111  CO2 23 22 20   GLUCOSE 100* 70 84  BUN 18 15 12    CREATININE 0.62 0.60 0.71  CALCIUM 8.2* 8.1* 7.5*  AST 17  --   --   ALT 7  --   --   ALKPHOS 123*  --   --   BILITOT 0.3  --   --    ------------------------------------------------------------------------------------------------------------------ estimated creatinine clearance is 92.2 ml/min (by C-G formula based on Cr of 0.71). ------------------------------------------------------------------------------------------------------------------ No results found for this basename: HGBA1C,  in the last 72 hours ------------------------------------------------------------------------------------------------------------------ No results found for this basename: CHOL, HDL, LDLCALC, TRIG, CHOLHDL, LDLDIRECT,  in the last 72 hours ------------------------------------------------------------------------------------------------------------------ No results found for this basename: TSH, T4TOTAL, FREET3, T3FREE, THYROIDAB,  in the last 72 hours ------------------------------------------------------------------------------------------------------------------ No results found for this basename: VITAMINB12, FOLATE, FERRITIN, TIBC, IRON, RETICCTPCT,  in the last 72 hours  Coagulation profile No results found for this basename: INR, PROTIME,  in the last 168 hours  No results found for this basename: DDIMER,  in the last 72 hours  Cardiac Enzymes  Recent Labs Lab 08/03/13 0540 08/03/13 1005 08/03/13 1608  TROPONINI <0.30 <0.30 <0.30   ------------------------------------------------------------------------------------------------------------------ No components found with this basename: POCBNP,     Saidi Santacroce D.O. on 08/08/2013 at 8:48 AM  Between 7am to 7pm - Pager - [phone]  After 7pm go to [email or web address] - password [email or web address]  And look for the night coverage person covering for me after hours  [facility] Office  [phone]

## 2013-08-08 NOTE — Anesthesia Postprocedure Evaluation (Signed)
  Anesthesia Post-op Note  Patient: Thomas [name]  Procedure(s) Performed: Procedure(s): SUPRACLAVICULAR LYMPH NODE BIOPSY (Right)  Patient Location: PACU  Anesthesia Type:General  Level of Consciousness: awake and alert   Airway and Oxygen Therapy: Patient Spontanous Breathing  Post-op Pain: none  Post-op Assessment: Post-op Vital signs reviewed, Patient's Cardiovascular Status Stable and Respiratory Function Stable  Post-op Vital Signs: Reviewed  Filed Vitals:   08/08/13 1415  BP:   Pulse: 84  Temp:   Resp: 16    Complications: No apparent anesthesia complications

## 2013-08-08 NOTE — Brief Op Note (Signed)
08/02/2013 - 08/08/2013  1:34 PM  PATIENT:  [name]  33 y.o. male  PRE-OPERATIVE DIAGNOSIS:  LYMPHADENOPATHY  POST-OPERATIVE DIAGNOSIS:  lymphadenopathy  PROCEDURE:  Procedure(s): SUPRACLAVICULAR LYMPH NODE BIOPSY (Right)  SURGEON:  Surgeon(s) and Role:    * Gaye Pollack, MD - Primary  PHYSICIAN ASSISTANT: none  ASSISTANTS: RNFA   ANESTHESIA:   general  EBL:  Total I/O In: -  Out: 505 [Urine:500; Blood:5]  BLOOD ADMINISTERED:none  DRAINS: none   LOCAL MEDICATIONS USED:  NONE  SPECIMEN:  Source of Specimen:  right supraclavicular lymph node  DISPOSITION OF SPECIMEN:  PATHOLOGY  COUNTS:  YES  DICTATION: .Note written in EPIC  PLAN OF CARE: Admit to inpatient   PATIENT DISPOSITION:  PACU - hemodynamically stable.   Delay start of Pharmacological VTE agent (>24hrs) due to surgical blood loss or risk of bleeding: yes

## 2013-08-08 NOTE — Anesthesia Procedure Notes (Signed)
Procedure Name: LMA Insertion Date/Time: 08/08/2013 12:49 PM Performed by: Carola Frost Pre-anesthesia Checklist: Emergency Drugs available, Patient identified, Timeout performed, Suction available and Patient being monitored Patient Re-evaluated:Patient Re-evaluated prior to inductionOxygen Delivery Method: Circle system utilized Preoxygenation: Pre-oxygenation with 100% oxygen Intubation Type: IV induction LMA: LMA inserted LMA Size: 4.0 Number of attempts: 1 Placement Confirmation: positive ETCO2 and breath sounds checked- equal and bilateral Tube secured with: Tape Dental Injury: Teeth and Oropharynx as per pre-operative assessment

## 2013-08-09 LAB — CULTURE, BLOOD (ROUTINE X 2): Culture: NO GROWTH

## 2013-08-09 LAB — HEMOGLOBIN AND HEMATOCRIT, BLOOD
HCT: 25.9 % — ABNORMAL LOW (ref 39.0–52.0)
Hemoglobin: 7.9 g/dL — ABNORMAL LOW (ref 13.0–17.0)

## 2013-08-09 MED ORDER — DOCUSATE SODIUM 100 MG PO CAPS: 100.0000 mg | ORAL_CAPSULE | Freq: Every day | ORAL | Status: DC | PRN

## 2013-08-09 MED ORDER — POLYETHYLENE GLYCOL 3350 17 G PO PACK
17.0000 g | PACK | Freq: Every day | ORAL | Status: DC
  Administered 2013-08-09 – 2013-08-10 (×2): 17 g via ORAL
  Filled 2013-08-09 (×3): qty 1

## 2013-08-09 NOTE — Progress Notes (Signed)
[facility]                                                                   Progress Note  Patient Demographics  Thomas [name], is a 33 y.o. male, DOB - 10/09/1980, NTI:[id number]  Admit date - 08/02/2013   Admitting Physician Merton Border, MD  Outpatient Primary MD for the patient is Philis Fendt, MD  LOS - 7   Chief Complaint  Patient presents with  . Weight Loss    onset 2 weeks unknown weight loss, decreased appetite  . Fatigue    x 1 1/2 month   . Abdominal Pain    onset several days suprapubic pain  . Diarrhea    onset 2 weeks diarreha x 1-2 per day         Assessment & Plan   Symptomatic anemia, iron deficiency -No longer symptomatic at this time -H/H [phone], will continue to monitor -Patient became tachycardic and hypotensive and has received 3 units since admission -FOBT was negative, GI was consulted and did recommend endoscopy at this time -Hematology consulted -Cardiothoracic surg consulted for supraclavicular lymph node biopsy, done 1/16 -Patient has been followed by Dr. Bobbye Charleston data in the past and was seen in December 2014 and was placed on iron supplementation, with plans for IV iron  Hypotension with hypovolemia -Infectious versus hypovolemia (from anemia) versus medication induced -To date, urine and blood cultures as well as stool cultures negative -C. difficile PCR negative -Atenolol discontinued on 08/02/2013 -TSH as well as cortisol levels were in normal limits  HIV/AIDS -Continue atripla -Infectious disease also following -CD4 count 660  Extensive lymphadenopathy and massive splenomegaly -CT scan of the abdomen and chest with extensive lymphadenopathy -ENT, Dr. Janace Hoard consulted for possible lymph node biopsy, , however verbal altercation with patient's mother -Thoracic surgery consulted, Dr. Cyndia Bent, biopsy conducted, pending results  Altered mental status -Resolved, patient is back to his baseline -CT of the head was negative    Protein malnutrition -Consulted nutrition -Continue feeding supplementation  Code Status: Full Family Communication: Mother at bedside Disposition Plan: Admitted, will likely discharge tomorrow 1/18  Time Spent in minutes   20 minutes  Procedures  Right supraclavicular lymph node biopsy  Consults   Infectious disease Oncology Thoracic surgery Interventional radiology  DVT Prophylaxis  TED hose  Lab Results  Component Value Date   PLT 78* 08/08/2013    Medications  Scheduled Meds: . efavirenz-emtricitabine-tenofovir  1 tablet Oral QHS  . feeding supplement (ENSURE COMPLETE)  237 mL Oral TID BM  . ferrous sulfate  325 mg Oral BID WC  . pantoprazole  40 mg Oral BID AC   Continuous Infusions: . lactated ringers 50 mL/hr at 08/09/13 0853   PRN Meds:.HYDROcodone-acetaminophen, morphine injection, ondansetron (ZOFRAN) IV, ondansetron, zolpidem  Antibiotics    Anti-infectives   Start     Dose/Rate Route Frequency Ordered Stop   08/08/13 0600  [MAR Hold]  ceFAZolin (ANCEF) IVPB 1 g/50 mL premix  Status:  Discontinued     (On MAR Hold since 08/08/13 1215)   1 g 100 mL/hr over 30 Minutes Intravenous On call to O.R. 08/07/13 1750 08/08/13 1438   08/02/13 2200  efavirenz-emtricitabine-tenofovir (ATRIPLA) 600-200-300 MG per tablet 1 tablet  1 tablet Oral Daily at bedtime 08/02/13 1835          Subjective:   Thomas [name] seen and examined today.  Patient has no complaints today.  States he is feeling better.   Objective:   Filed Vitals:   08/08/13 2231 08/09/13 0200 08/09/13 0548 08/09/13 1143  BP: 99/59 100/67 88/55 92/46   Pulse: 112 117 112   Temp: 98.7 F (37.1 C) 97.4 F (36.3 C) 98 F (36.7 C)   TempSrc: Oral Oral Oral   Resp: 20 18 18    Height:      Weight:      SpO2: 99% 100% 100%     Wt Readings from Last 3 Encounters:  08/03/13 48.3 kg (106 lb 7.7 oz)  08/03/13 48.3 kg (106 lb 7.7 oz)  07/08/13 54.432 kg (120 lb)     Intake/Output  Summary (Last 24 hours) at 08/09/13 1156 Last data filed at 08/09/13 1040  Gross per 24 hour  Intake   2251 ml  Output   3984 ml  Net  -1733 ml    Exam  General: Well developed, malnourished, NAD, appears stated age  HEENT: NCAT, PERRLA, EOMI, Anicteic Sclera, mucous membranes moist.  Neck: Supple, no JVD, bilateral supraclavicular lymph nodes present  Cardiovascular: S1 S2 auscultated, no rubs, murmurs or gallops. Regular rate and rhythm.  Respiratory: Clear to auscultation bilaterally with equal chest rise  Abdomen: Soft, nontender, nondistended, + bowel sounds  Extremities: warm dry without cyanosis clubbing or edema  Neuro: AAOx3, cranial nerves grossly intact.   Skin: Without rashes exudates or nodules  Psych: Normal affect and demeanor with intact judgement and insight   Data Review   Micro Results Recent Results (from the past 240 hour(s))  CMV CULTURE CMVC     Status: None   Collection Time    08/02/13  9:12 PM      Result Value Range Status   Specimen Description NASAL SWAB   Final   Special Requests NONE   Final   Culture     Final   Value: No Cytomegalovirus identified in cell culture                                                                A negative result does not exclude the possibility of CMV infection;inappropriate specimen collection,storage and transport may lead to false      negative results.     Performed at [facility]   Report Status 08/07/2013 FINAL   Final  GC/CHLAMYDIA PROBE AMP     Status: None   Collection Time    08/03/13  2:04 PM      Result Value Range Status   CT Probe RNA NEGATIVE  NEGATIVE Final   GC Probe RNA NEGATIVE  NEGATIVE Final   Comment: (NOTE)                                                                                               **  Normal Reference Range: Negative**          Assay performed using the Gen-Probe APTIMA COMBO2 (R) Assay.     Acceptable specimen types for this assay include APTIMA  Swabs (Unisex,     endocervical, urethral, or vaginal), first void urine, and ThinPrep     liquid based cytology samples.     Performed at [facility]     Status: None   Collection Time    08/03/13  2:04 PM      Result Value Range Status   Specimen Description URINE, RANDOM   Final   Special Requests NONE   Final   Culture  Setup Time     Final   Value: 08/03/2013 18:59     Performed at [facility]     Final   Value: >=100,000 COLONIES/ML     Performed at [facility]   Culture     Final   Value: STAPHYLOCOCCUS SPECIES (COAGULASE NEGATIVE)     Note: RIFAMPIN AND GENTAMICIN SHOULD NOT BE USED AS SINGLE DRUGS FOR TREATMENT OF STAPH INFECTIONS.     Performed at [facility]   Report Status 08/05/2013 FINAL   Final   Organism ID, Bacteria STAPHYLOCOCCUS SPECIES (COAGULASE NEGATIVE)   Final  CULTURE, BLOOD (ROUTINE X 2)     Status: None   Collection Time    08/03/13  2:14 PM      Result Value Range Status   Specimen Description BLOOD RIGHT ARM   Final   Special Requests BOTTLES DRAWN AEROBIC AND ANAEROBIC 10CC EACH   Final   Culture  Setup Time     Final   Value: 08/03/2013 22:51     Performed at [facility]   Culture     Final   Value:        BLOOD CULTURE RECEIVED NO GROWTH TO DATE CULTURE WILL BE HELD FOR 5 DAYS BEFORE ISSUING A FINAL NEGATIVE REPORT     Performed at [facility]   Report Status PENDING   Incomplete  AFB CULTURE, BLOOD     Status: None   Collection Time    08/03/13  2:14 PM      Result Value Range Status   Specimen Description BLOOD RIGHT ARM   Final   Special Requests BOTTLES DRAWN AEROBIC ONLY 5CC   Final   Culture     Final   Value: CULTURE WILL BE EXAMINED FOR 6 WEEKS BEFORE ISSUING A FINAL REPORT     Performed at [facility]   Report Status PENDING   Incomplete  STOOL CULTURE     Status: None   Collection Time    08/03/13 10:48 PM      Result Value Range Status    Specimen Description STOOL   Final   Special Requests NONE   Final   Culture     Final   Value: NO SALMONELLA, SHIGELLA, CAMPYLOBACTER, YERSINIA, OR E.COLI 0157:H7 ISOLATED     Performed at [facility]   Report Status 08/08/2013 FINAL   Final  CLOSTRIDIUM DIFFICILE BY PCR     Status: None   Collection Time    08/04/13 10:07 PM      Result Value Range Status   C difficile by pcr NEGATIVE  NEGATIVE Final  SURGICAL PCR SCREEN     Status: None   Collection Time    08/08/13  7:49 AM  Result Value Range Status   MRSA, PCR NEGATIVE  NEGATIVE Final   Staphylococcus aureus NEGATIVE  NEGATIVE Final   Comment:            The Xpert SA Assay (FDA     approved for NASAL specimens     in patients over 57 years of age),     is one component of     a comprehensive surveillance     program.  Test performance has     been validated by [employer] for patients greater     than or equal to 78 year old.     It is not intended     to diagnose infection nor to     guide or monitor treatment.    Radiology Reports Dg Chest 2 View  08/02/2013   CLINICAL DATA:  Shortness of breath and chest pain  EXAM: CHEST  2 VIEW  COMPARISON:  06/09/2008  FINDINGS: The heart size and mediastinal contours are within normal limits. Both lungs are clear. The visualized skeletal structures are unremarkable.  IMPRESSION: No active cardiopulmonary disease.   Electronically Signed   By: Alcide Clever M.D.   On: 08/02/2013 17:20   Ct Head Wo Contrast  08/02/2013   CLINICAL DATA:  Dizziness, headache.  EXAM: CT HEAD WITHOUT CONTRAST  TECHNIQUE: Contiguous axial images were obtained from the base of the skull through the vertex without intravenous contrast.  COMPARISON:  None.  FINDINGS: Bony calvarium appears intact. No mass effect or midline shift is noted. Ventricular size is within normal limits. There is no evidence of mass lesion, hemorrhage or acute infarction.  IMPRESSION: No gross intracranial  abnormality seen.   Electronically Signed   By: Roque Lias M.D.   On: 08/02/2013 17:04   Ct Chest W Contrast  08/04/2013   CLINICAL DATA:  Anemia, fever and weight loss. Evaluate for potential lymphoma. HIV-positive patient.  EXAM: CT CHEST, ABDOMEN AND PELVIS WITHOUT CONTRAST  TECHNIQUE: Multidetector CT imaging of the chest, abdomen and pelvis was performed following the standard protocol without IV contrast.  COMPARISON:  No priors.  FINDINGS: CT CHEST FINDINGS  Mediastinum: Numerous borderline enlarged and mildly enlarged mediastinal lymph nodes are noted, largest of which include a 12 mm low left paratracheal lymph node and a 11 mm lymph node in the superior mediastinum immediately to the right of the innominate artery. Enlarged retrocrural and posterior mediastinal lymph nodes measuring up to 15 mm in short axis. Heart size is normal. Small amount of pericardial fluid and/or thickening, unlikely to be of hemodynamic significance at this time. No associated pericardial calcification. Esophagus is unremarkable in appearance.  Lungs/Pleura: No acute consolidative airspace disease. No pleural effusions. No suspicious appearing pulmonary nodules or masses.  Musculoskeletal: There are no aggressive appearing lytic or blastic lesions noted in the visualized portions of the skeleton.  CT ABDOMEN AND PELVIS FINDINGS  Abdomen/Pelvis: The spleen is massively enlarged measuring 14.3 x 7.5 x 24.0 cm (estimated splenic volume of 1,287 mL), and the spleen is diffusely heterogeneous in attenuation/enhancement, suggesting diffuse infiltration by a innumerable lesions, presumably lymphomatous. There is extensive lymphadenopathy throughout the retroperitoneum and upper abdomen. Numerous enlarged lymph nodes in the celiac axis are noted, best demonstrated on image 58 of series 2 retroperitoneal lymph nodes measuring up to 21 mm in short axis are noted in the left para-aortic station anterior to the left renal hilum. This  retroperitoneal lymphadenopathy extends to the pelvis where there  are also bilateral external iliac lymph nodes (left greater than right), largest of which on the left side measures up to 15 mm.  The appearance of the liver, gallbladder, pancreas and bilateral adrenal glands is unremarkable. There are numerous nonobstructive calculi within the collecting systems of the kidneys bilaterally, largest of which measures 11 mm in the upper pole collecting system of the right kidney. In addition, there appear to be 2 calculi in the proximal right ureter just beyond the right ureteropelvic junction, largest of which measures 1 cm. Mild fullness in the renal collecting systems bilaterally, but no frank hydroureteronephrosis or perinephric stranding to suggest significant urinary tract obstruction at this time. No bladder calculi identified.  Trace volume of ascites in the low anatomic pelvis. No pneumoperitoneum. No pathologic distention of small bowel. Prostate gland and urinary bladder are unremarkable in appearance.  Musculoskeletal: There are no aggressive appearing lytic or blastic lesions noted in the visualized portions of the skeleton.  IMPRESSION: 1. Extensive lymphadenopathy in the chest, abdomen and pelvis, with massive splenomegaly, as detailed above, highly concerning for lymphoma. 2. Numerous nonobstructive calculi within the collecting systems of the kidneys bilaterally, largest of which on the right side measure up to 11 mm. In addition, there are 2 calculi in the proximal right ureter just distal to the right ureteropelvic junction, largest of which measures up to 1 cm. Despite this, there does not appear to be significant urinary tract obstruction at this time. 3. Trace volume of ascites. 4. Small amount of pericardial fluid and/or thickening, unlikely to be of hemodynamic significance at this time.   Electronically Signed   By: Vinnie Langton M.D.   On: 08/04/2013 14:42   Ct Abdomen Pelvis W  Contrast  08/04/2013   CLINICAL DATA:  Anemia, fever and weight loss. Evaluate for potential lymphoma. HIV-positive patient.  EXAM: CT CHEST, ABDOMEN AND PELVIS WITHOUT CONTRAST  TECHNIQUE: Multidetector CT imaging of the chest, abdomen and pelvis was performed following the standard protocol without IV contrast.  COMPARISON:  No priors.  FINDINGS: CT CHEST FINDINGS  Mediastinum: Numerous borderline enlarged and mildly enlarged mediastinal lymph nodes are noted, largest of which include a 12 mm low left paratracheal lymph node and a 11 mm lymph node in the superior mediastinum immediately to the right of the innominate artery. Enlarged retrocrural and posterior mediastinal lymph nodes measuring up to 15 mm in short axis. Heart size is normal. Small amount of pericardial fluid and/or thickening, unlikely to be of hemodynamic significance at this time. No associated pericardial calcification. Esophagus is unremarkable in appearance.  Lungs/Pleura: No acute consolidative airspace disease. No pleural effusions. No suspicious appearing pulmonary nodules or masses.  Musculoskeletal: There are no aggressive appearing lytic or blastic lesions noted in the visualized portions of the skeleton.  CT ABDOMEN AND PELVIS FINDINGS  Abdomen/Pelvis: The spleen is massively enlarged measuring 14.3 x 7.5 x 24.0 cm (estimated splenic volume of 1,287 mL), and the spleen is diffusely heterogeneous in attenuation/enhancement, suggesting diffuse infiltration by a innumerable lesions, presumably lymphomatous. There is extensive lymphadenopathy throughout the retroperitoneum and upper abdomen. Numerous enlarged lymph nodes in the celiac axis are noted, best demonstrated on image 58 of series 2 retroperitoneal lymph nodes measuring up to 21 mm in short axis are noted in the left para-aortic station anterior to the left renal hilum. This retroperitoneal lymphadenopathy extends to the pelvis where there are also bilateral external iliac lymph  nodes (left greater than right), largest of which on the left side  measures up to 15 mm.  The appearance of the liver, gallbladder, pancreas and bilateral adrenal glands is unremarkable. There are numerous nonobstructive calculi within the collecting systems of the kidneys bilaterally, largest of which measures 11 mm in the upper pole collecting system of the right kidney. In addition, there appear to be 2 calculi in the proximal right ureter just beyond the right ureteropelvic junction, largest of which measures 1 cm. Mild fullness in the renal collecting systems bilaterally, but no frank hydroureteronephrosis or perinephric stranding to suggest significant urinary tract obstruction at this time. No bladder calculi identified.  Trace volume of ascites in the low anatomic pelvis. No pneumoperitoneum. No pathologic distention of small bowel. Prostate gland and urinary bladder are unremarkable in appearance.  Musculoskeletal: There are no aggressive appearing lytic or blastic lesions noted in the visualized portions of the skeleton.  IMPRESSION: 1. Extensive lymphadenopathy in the chest, abdomen and pelvis, with massive splenomegaly, as detailed above, highly concerning for lymphoma. 2. Numerous nonobstructive calculi within the collecting systems of the kidneys bilaterally, largest of which on the right side measure up to 11 mm. In addition, there are 2 calculi in the proximal right ureter just distal to the right ureteropelvic junction, largest of which measures up to 1 cm. Despite this, there does not appear to be significant urinary tract obstruction at this time. 3. Trace volume of ascites. 4. Small amount of pericardial fluid and/or thickening, unlikely to be of hemodynamic significance at this time.   Electronically Signed   By: Vinnie Langton M.D.   On: 08/04/2013 14:42    CBC  Recent Labs Lab 08/02/13 1620 08/03/13 0630 08/04/13 0339 08/06/13 0451 08/07/13 0430 08/08/13 0307 08/09/13 0327  WBC  4.6 3.2* 3.3* 4.3 4.0 4.3  --   HGB 5.6* 7.5* 7.1* 7.3* 7.8* 7.6* 7.9*  HCT 19.3* 23.6* 22.5* 23.5* 25.3* 24.7* 25.9*  PLT 103* 77* 86* 87* 78* 78*  --   MCV 99.5 94.0 94.9 97.5 98.4 98.4  --   MCH 28.9 29.9 30.0 30.3 30.4 30.3  --   MCHC 29.0* 31.8 31.6 31.1 30.8 30.8  --   RDW 26.1* 22.7* 24.0* 24.7* 23.1* 22.6*  --   LYMPHSABS 2.5  --   --   --   --   --   --   MONOABS 0.9  --   --   --   --   --   --   EOSABS 0.0  --   --   --   --   --   --   BASOSABS 0.0  --   --   --   --   --   --     Chemistries   Recent Labs Lab 08/02/13 1620 08/03/13 0710 08/04/13 0339  NA 143 145 141  K 3.8 3.8 3.6*  CL 109 112 111  CO2 23 22 20   GLUCOSE 100* 70 84  BUN 18 15 12   CREATININE 0.62 0.60 0.71  CALCIUM 8.2* 8.1* 7.5*  AST 17  --   --   ALT 7  --   --   ALKPHOS 123*  --   --   BILITOT 0.3  --   --    ------------------------------------------------------------------------------------------------------------------ estimated creatinine clearance is 92.2 ml/min (by C-G formula based on Cr of 0.71). ------------------------------------------------------------------------------------------------------------------ No results found for this basename: HGBA1C,  in the last 72 hours ------------------------------------------------------------------------------------------------------------------ No results found for this basename: CHOL, HDL, LDLCALC, TRIG, CHOLHDL, LDLDIRECT,  in the  last 72 hours ------------------------------------------------------------------------------------------------------------------ No results found for this basename: TSH, T4TOTAL, FREET3, T3FREE, THYROIDAB,  in the last 72 hours ------------------------------------------------------------------------------------------------------------------ No results found for this basename: VITAMINB12, FOLATE, FERRITIN, TIBC, IRON, RETICCTPCT,  in the last 72 hours  Coagulation profile No results found for this basename: INR,  PROTIME,  in the last 168 hours  No results found for this basename: DDIMER,  in the last 72 hours  Cardiac Enzymes  Recent Labs Lab 08/03/13 0540 08/03/13 1005 08/03/13 1608  TROPONINI <0.30 <0.30 <0.30   ------------------------------------------------------------------------------------------------------------------ No components found with this basename: POCBNP,     Galileo Colello D.O. on 08/09/2013 at 11:56 AM  Between 7am to 7pm - Pager - [phone]  After 7pm go to [email or web address] - password [email or web address]  And look for the night coverage person covering for me after hours  [facility] Office  [phone]

## 2013-08-09 NOTE — Progress Notes (Addendum)
      [address]       ,[place] [address]             [phone]      1 Day Post-Op Procedure(s) (LRB): SUPRACLAVICULAR LYMPH NODE BIOPSY (Right)  Subjective:  Patient with no complaints.  Minimal pain at incision site.  He does ask if he can shower.  Objective: Vital signs in last 24 hours: Temp:  [97.1 F (36.2 C)-98.7 F (37.1 C)] 98 F (36.7 C) (01/17 0548) Pulse Rate:  [84-117] 112 (01/17 0548) Cardiac Rhythm:  [-] Normal sinus rhythm (01/16 1445) Resp:  [14-20] 18 (01/17 0548) BP: (88-100)/(55-67) 88/55 mmHg (01/17 0548) SpO2:  [99 %-100 %] 100 % (01/17 0548)  Intake/Output from previous day: 01/16 0701 - 01/17 0700 In: 2251 [I.V.:2251] Out: 3734 [Urine:3729; Blood:5]  General appearance: alert, cooperative and no distress Heart: regular rate and rhythm Lungs: clear to auscultation bilaterally Abdomen: soft, non-tender; bowel sounds normal; no masses,  no organomegaly Wound: clean and dry, dermabond in place  Lab Results:  Recent Labs  08/07/13 0430 08/08/13 0307 08/09/13 0327  WBC 4.0 4.3  --   HGB 7.8* 7.6* 7.9*  HCT 25.3* 24.7* 25.9*  PLT 78* 78*  --    BMET: No results found for this basename: NA, K, CL, CO2, GLUCOSE, BUN, CREATININE, CALCIUM,  in the last 72 hours  PT/INR: No results found for this basename: LABPROT, INR,  in the last 72 hours ABG    Component Value Date/Time   TCO2 28 02/20/2012 1421   CBG (last 3)  No results found for this basename: GLUCAP,  in the last 72 hours  Assessment/Plan: S/P Procedure(s) (LRB): SUPRACLAVICULAR LYMPH NODE BIOPSY (Right)  1. Wound- is clean and dry, dermabond in place 2. Pain control- minimal pain 3. Dispo- okay to shower from our standpoint, gave patient proper care instructions for incision in shower 4. Care per primary   LOS: 7 days    BARRETT, ERIN 08/09/2013  I have seen and examined the patient and agree with the assessment and plan as outlined.  OWEN,CLARENCE  H 08/09/2013 12:00 PM

## 2013-08-10 LAB — HEMOGLOBIN AND HEMATOCRIT, BLOOD
HCT: 25.9 % — ABNORMAL LOW (ref 39.0–52.0)
Hemoglobin: 8 g/dL — ABNORMAL LOW (ref 13.0–17.0)

## 2013-08-10 NOTE — Progress Notes (Signed)
Smoking cessation information given to the patient.

## 2013-08-10 NOTE — Progress Notes (Addendum)
      [address]       [place] [address]             [phone]      2 Days Post-Op Procedure(s) (LRB): SUPRACLAVICULAR LYMPH NODE BIOPSY (Right)  Subjective:  Mr. Conery has no complaints this morning.  Possibly going home tomorrow Objective: Vital signs in last 24 hours: Temp:  [97.4 F (36.3 C)-98.5 F (36.9 C)] 97.4 F (36.3 C) (01/18 0540) Pulse Rate:  [106-122] 106 (01/18 0540) Cardiac Rhythm:  [-]  Resp:  [16-18] 16 (01/18 0540) BP: (82-107)/(46-69) 103/69 mmHg (01/18 0540) SpO2:  [100 %] 100 % (01/18 0540)  Intake/Output from previous day: 01/17 0701 - 01/18 0700 In: 1267 [P.O.:720; I.V.:547] Out: 4000 [Urine:4000]  General appearance: alert, cooperative and no distress Heart: regular rate and rhythm Lungs: clear to auscultation bilaterally Wound: clean and dry  Lab Results:  Recent Labs  08/08/13 0307 08/09/13 0327 08/10/13 0515  WBC 4.3  --   --   HGB 7.6* 7.9* 8.0*  HCT 24.7* 25.9* 25.9*  PLT 78*  --   --    BMET: No results found for this basename: NA, K, CL, CO2, GLUCOSE, BUN, CREATININE, CALCIUM,  in the last 72 hours  PT/INR: No results found for this basename: LABPROT, INR,  in the last 72 hours ABG    Component Value Date/Time   TCO2 28 02/20/2012 1421   CBG (last 3)  No results found for this basename: GLUCAP,  in the last 72 hours  Assessment/Plan: S/P Procedure(s) (LRB): SUPRACLAVICULAR LYMPH NODE BIOPSY (Right)  1. Wound clean and dry, dermabond remains intact 2. Dispo- wound looks good, will see patient in 1 week for wound check, pathology is pending, care per primary     LOS: 8 days    BARRETT, ERIN 08/10/2013  I have seen and examined the patient and agree with the assessment and plan as outlined.  Rika Daughdrill H 08/10/2013

## 2013-08-10 NOTE — Progress Notes (Signed)
[facility]                                                                   Progress Note  Patient Demographics  Thomas [name], is a 33 y.o. male, DOB - 09-28-1980, [id number]  Admit date - 08/02/2013   Admitting Physician Merton Border, MD  Outpatient Primary MD for the patient is Philis Fendt, MD  LOS - 8   Chief Complaint  Patient presents with  . Weight Loss    onset 2 weeks unknown weight loss, decreased appetite  . Fatigue    x 1 1/2 month   . Abdominal Pain    onset several days suprapubic pain  . Diarrhea    onset 2 weeks diarreha x 1-2 per day         Assessment & Plan   Symptomatic anemia, iron deficiency -No longer symptomatic at this time -H/H 8.0/25.9, will continue to monitor -No longer tachycardic and hypotensive, has received 3 units since admission -FOBT was negative, GI was consulted and did recommend endoscopy at this time -Hematology consulted -Cardiothoracic surg consulted for supraclavicular lymph node biopsy, done 1/16 -Patient has been followed by Dr. Bobbye Charleston data in the past and was seen in December 2014 and was placed on iron supplementation, with plans for IV iron  Hypotension with hypovolemia -Infectious versus hypovolemia (from anemia) versus medication induced -To date, urine and blood cultures as well as stool cultures negative -C. difficile PCR negative -Atenolol discontinued on 08/02/2013 -TSH as well as cortisol levels were in normal limits  HIV/AIDS -Continue atripla -Infectious disease also following -CD4 count 660  Extensive lymphadenopathy and massive splenomegaly -CT scan of the abdomen and chest with extensive lymphadenopathy -ENT, Dr. Janace Hoard consulted for possible lymph node biopsy, , however verbal altercation with patient's mother -Thoracic surgery consulted, Dr. Cyndia Bent, biopsy conducted, pending results  Altered mental status -Resolved, patient is back to his baseline -CT of the head was negative   Protein  malnutrition -Consulted nutrition -Continue feeding supplementation  Constipation -Will add on stool softeners and continue to monitor -Encouraged patient to get out of bed and walk   Code Status: Full Family Communication: Mother at bedside Disposition Plan: Admitted, will likely discharge tomorrow 1/19  Time Spent in minutes   20 minutes  Procedures  Right supraclavicular lymph node biopsy  Consults   Infectious disease Oncology Thoracic surgery Interventional radiology  DVT Prophylaxis  TED hose  Lab Results  Component Value Date   PLT 78* 08/08/2013    Medications  Scheduled Meds: . efavirenz-emtricitabine-tenofovir  1 tablet Oral QHS  . feeding supplement (ENSURE COMPLETE)  237 mL Oral TID BM  . ferrous sulfate  325 mg Oral BID WC  . pantoprazole  40 mg Oral BID AC  . polyethylene glycol  17 g Oral Daily   Continuous Infusions: . lactated ringers 50 mL/hr at 08/10/13 0311   PRN Meds:.docusate sodium, HYDROcodone-acetaminophen, morphine injection, ondansetron (ZOFRAN) IV, ondansetron, zolpidem  Antibiotics    Anti-infectives   Start     Dose/Rate Route Frequency Ordered Stop   08/08/13 0600  [MAR Hold]  ceFAZolin (ANCEF) IVPB 1 g/50 mL premix  Status:  Discontinued     (On MAR Hold since 08/08/13 1215)   1 g  100 mL/hr over 30 Minutes Intravenous On call to O.R. 08/07/13 1750 08/08/13 1438   08/02/13 2200  efavirenz-emtricitabine-tenofovir (ATRIPLA) 600-200-300 MG per tablet 1 tablet     1 tablet Oral Daily at bedtime 08/02/13 1835          Subjective:   Thomas [name] seen and examined today. States he is feeling better. Patient complains about constipation.  Objective:   Filed Vitals:   08/09/13 1143 08/09/13 1410 08/09/13 2146 08/10/13 0540  BP: 92/46 107/64 100/61 103/69  Pulse:  122 114 106  Temp:  97.8 F (36.6 C) 98.3 F (36.8 C) 97.4 F (36.3 C)  TempSrc:  Oral Oral Oral  Resp:  18 18 16   Height:      Weight:      SpO2:  100% 100%  100%    Wt Readings from Last 3 Encounters:  08/03/13 48.3 kg (106 lb 7.7 oz)  08/03/13 48.3 kg (106 lb 7.7 oz)  07/08/13 54.432 kg (120 lb)     Intake/Output Summary (Last 24 hours) at 08/10/13 [id number] Last data filed at 08/10/13 0541  Gross per 24 hour  Intake   1267 ml  Output   4000 ml  Net  -2733 ml    Exam  General: Well developed, malnourished, NAD, appears stated age  HEENT: NCAT, PERRLA, EOMI, Anicteic Sclera, mucous membranes moist.  Neck: Supple, no JVD, bilateral supraclavicular lymph nodes present  Cardiovascular: S1 S2 auscultated, no rubs, murmurs or gallops. Regular rate and rhythm.  Respiratory: Clear to auscultation bilaterally with equal chest rise  Abdomen: Soft, nontender, nondistended, + bowel sounds  Extremities: warm dry without cyanosis clubbing or edema  Neuro: AAOx3, cranial nerves grossly intact.   Skin: Without rashes exudates or nodules  Psych: Normal affect and demeanor with intact judgement and insight   Data Review   Micro Results Recent Results (from the past 240 hour(s))  CMV CULTURE CMVC     Status: None   Collection Time    08/02/13  9:12 PM      Result Value Range Status   Specimen Description NASAL SWAB   Final   Special Requests NONE   Final   Culture     Final   Value: No Cytomegalovirus identified in cell culture                                                                A negative result does not exclude the possibility of CMV infection;inappropriate specimen collection,storage and transport may lead to false      negative results.     Performed at [facility]   Report Status 08/07/2013 FINAL   Final  GC/CHLAMYDIA PROBE AMP     Status: None   Collection Time    08/03/13  2:04 PM      Result Value Range Status   CT Probe RNA NEGATIVE  NEGATIVE Final   GC Probe RNA NEGATIVE  NEGATIVE Final   Comment: (NOTE)                                                                                                **  Normal Reference Range: Negative**          Assay performed using the Gen-Probe APTIMA COMBO2 (R) Assay.     Acceptable specimen types for this assay include APTIMA Swabs (Unisex,     endocervical, urethral, or vaginal), first void urine, and ThinPrep     liquid based cytology samples.     Performed at [facility]     Status: None   Collection Time    08/03/13  2:04 PM      Result Value Range Status   Specimen Description URINE, RANDOM   Final   Special Requests NONE   Final   Culture  Setup Time     Final   Value: 08/03/2013 18:59     Performed at [place]     Final   Value: >=100,000 COLONIES/ML     Performed at [facility]   Culture     Final   Value: STAPHYLOCOCCUS SPECIES (COAGULASE NEGATIVE)     Note: RIFAMPIN AND GENTAMICIN SHOULD NOT BE USED AS SINGLE DRUGS FOR TREATMENT OF STAPH INFECTIONS.     Performed at [facility]   Report Status 08/05/2013 FINAL   Final   Organism ID, Bacteria STAPHYLOCOCCUS SPECIES (COAGULASE NEGATIVE)   Final  CULTURE, BLOOD (ROUTINE X 2)     Status: None   Collection Time    08/03/13  2:14 PM      Result Value Range Status   Specimen Description BLOOD RIGHT ARM   Final   Special Requests BOTTLES DRAWN AEROBIC AND ANAEROBIC 10CC EACH   Final   Culture  Setup Time     Final   Value: 08/03/2013 22:51     Performed at [facility]   Culture     Final   Value: NO GROWTH 5 DAYS     Performed at [facility]   Report Status 08/09/2013 FINAL   Final  AFB CULTURE, BLOOD     Status: None   Collection Time    08/03/13  2:14 PM      Result Value Range Status   Specimen Description BLOOD RIGHT ARM   Final   Special Requests BOTTLES DRAWN AEROBIC ONLY 5CC   Final   Culture     Final   Value: CULTURE WILL BE EXAMINED FOR 6 WEEKS BEFORE ISSUING A FINAL REPORT     Performed at [facility]   Report Status PENDING   Incomplete  STOOL CULTURE     Status: None    Collection Time    08/03/13 10:48 PM      Result Value Range Status   Specimen Description STOOL   Final   Special Requests NONE   Final   Culture     Final   Value: NO SALMONELLA, SHIGELLA, CAMPYLOBACTER, YERSINIA, OR E.COLI 0157:[id number] ISOLATED     Performed at [facility]   Report Status 08/08/2013 FINAL   Final  CLOSTRIDIUM DIFFICILE BY PCR     Status: None   Collection Time    08/04/13 10:07 PM      Result Value Range Status   C difficile by pcr NEGATIVE  NEGATIVE Final  SURGICAL PCR SCREEN     Status: None   Collection Time    08/08/13  7:49 AM      Result Value Range Status   MRSA, PCR NEGATIVE  NEGATIVE Final   Staphylococcus aureus NEGATIVE  NEGATIVE Final  Comment:            The Xpert SA Assay (FDA     approved for NASAL specimens     in patients over 53 years of age),     is one component of     a comprehensive surveillance     program.  Test performance has     been validated by [employer] for patients greater     than or equal to 54 year old.     It is not intended     to diagnose infection nor to     guide or monitor treatment.    Radiology Reports Dg Chest 2 View  08/02/2013   CLINICAL DATA:  Shortness of breath and chest pain  EXAM: CHEST  2 VIEW  COMPARISON:  06/09/2008  FINDINGS: The heart size and mediastinal contours are within normal limits. Both lungs are clear. The visualized skeletal structures are unremarkable.  IMPRESSION: No active cardiopulmonary disease.   Electronically Signed   By: Inez Catalina M.D.   On: 08/02/2013 17:20   Ct Head Wo Contrast  08/02/2013   CLINICAL DATA:  Dizziness, headache.  EXAM: CT HEAD WITHOUT CONTRAST  TECHNIQUE: Contiguous axial images were obtained from the base of the skull through the vertex without intravenous contrast.  COMPARISON:  None.  FINDINGS: Bony calvarium appears intact. No mass effect or midline shift is noted. Ventricular size is within normal limits. There is no evidence of mass lesion,  hemorrhage or acute infarction.  IMPRESSION: No gross intracranial abnormality seen.   Electronically Signed   By: Sabino Dick M.D.   On: 08/02/2013 17:04   Ct Chest W Contrast  08/04/2013   CLINICAL DATA:  Anemia, fever and weight loss. Evaluate for potential lymphoma. HIV-positive patient.  EXAM: CT CHEST, ABDOMEN AND PELVIS WITHOUT CONTRAST  TECHNIQUE: Multidetector CT imaging of the chest, abdomen and pelvis was performed following the standard protocol without IV contrast.  COMPARISON:  No priors.  FINDINGS: CT CHEST FINDINGS  Mediastinum: Numerous borderline enlarged and mildly enlarged mediastinal lymph nodes are noted, largest of which include a 12 mm low left paratracheal lymph node and a 11 mm lymph node in the superior mediastinum immediately to the right of the innominate artery. Enlarged retrocrural and posterior mediastinal lymph nodes measuring up to 15 mm in short axis. Heart size is normal. Small amount of pericardial fluid and/or thickening, unlikely to be of hemodynamic significance at this time. No associated pericardial calcification. Esophagus is unremarkable in appearance.  Lungs/Pleura: No acute consolidative airspace disease. No pleural effusions. No suspicious appearing pulmonary nodules or masses.  Musculoskeletal: There are no aggressive appearing lytic or blastic lesions noted in the visualized portions of the skeleton.  CT ABDOMEN AND PELVIS FINDINGS  Abdomen/Pelvis: The spleen is massively enlarged measuring 14.3 x 7.5 x 24.0 cm (estimated splenic volume of 1,287 mL), and the spleen is diffusely heterogeneous in attenuation/enhancement, suggesting diffuse infiltration by a innumerable lesions, presumably lymphomatous. There is extensive lymphadenopathy throughout the retroperitoneum and upper abdomen. Numerous enlarged lymph nodes in the celiac axis are noted, best demonstrated on image 58 of series 2 retroperitoneal lymph nodes measuring up to 21 mm in short axis are noted in the  left para-aortic station anterior to the left renal hilum. This retroperitoneal lymphadenopathy extends to the pelvis where there are also bilateral external iliac lymph nodes (left greater than right), largest of which on the left side measures up to 15  mm.  The appearance of the liver, gallbladder, pancreas and bilateral adrenal glands is unremarkable. There are numerous nonobstructive calculi within the collecting systems of the kidneys bilaterally, largest of which measures 11 mm in the upper pole collecting system of the right kidney. In addition, there appear to be 2 calculi in the proximal right ureter just beyond the right ureteropelvic junction, largest of which measures 1 cm. Mild fullness in the renal collecting systems bilaterally, but no frank hydroureteronephrosis or perinephric stranding to suggest significant urinary tract obstruction at this time. No bladder calculi identified.  Trace volume of ascites in the low anatomic pelvis. No pneumoperitoneum. No pathologic distention of small bowel. Prostate gland and urinary bladder are unremarkable in appearance.  Musculoskeletal: There are no aggressive appearing lytic or blastic lesions noted in the visualized portions of the skeleton.  IMPRESSION: 1. Extensive lymphadenopathy in the chest, abdomen and pelvis, with massive splenomegaly, as detailed above, highly concerning for lymphoma. 2. Numerous nonobstructive calculi within the collecting systems of the kidneys bilaterally, largest of which on the right side measure up to 11 mm. In addition, there are 2 calculi in the proximal right ureter just distal to the right ureteropelvic junction, largest of which measures up to 1 cm. Despite this, there does not appear to be significant urinary tract obstruction at this time. 3. Trace volume of ascites. 4. Small amount of pericardial fluid and/or thickening, unlikely to be of hemodynamic significance at this time.   Electronically Signed   By: Vinnie Langton M.D.   On: 08/04/2013 14:42   Ct Abdomen Pelvis W Contrast  08/04/2013   CLINICAL DATA:  Anemia, fever and weight loss. Evaluate for potential lymphoma. HIV-positive patient.  EXAM: CT CHEST, ABDOMEN AND PELVIS WITHOUT CONTRAST  TECHNIQUE: Multidetector CT imaging of the chest, abdomen and pelvis was performed following the standard protocol without IV contrast.  COMPARISON:  No priors.  FINDINGS: CT CHEST FINDINGS  Mediastinum: Numerous borderline enlarged and mildly enlarged mediastinal lymph nodes are noted, largest of which include a 12 mm low left paratracheal lymph node and a 11 mm lymph node in the superior mediastinum immediately to the right of the innominate artery. Enlarged retrocrural and posterior mediastinal lymph nodes measuring up to 15 mm in short axis. Heart size is normal. Small amount of pericardial fluid and/or thickening, unlikely to be of hemodynamic significance at this time. No associated pericardial calcification. Esophagus is unremarkable in appearance.  Lungs/Pleura: No acute consolidative airspace disease. No pleural effusions. No suspicious appearing pulmonary nodules or masses.  Musculoskeletal: There are no aggressive appearing lytic or blastic lesions noted in the visualized portions of the skeleton.  CT ABDOMEN AND PELVIS FINDINGS  Abdomen/Pelvis: The spleen is massively enlarged measuring 14.3 x 7.5 x 24.0 cm (estimated splenic volume of 1,287 mL), and the spleen is diffusely heterogeneous in attenuation/enhancement, suggesting diffuse infiltration by a innumerable lesions, presumably lymphomatous. There is extensive lymphadenopathy throughout the retroperitoneum and upper abdomen. Numerous enlarged lymph nodes in the celiac axis are noted, best demonstrated on image 58 of series 2 retroperitoneal lymph nodes measuring up to 21 mm in short axis are noted in the left para-aortic station anterior to the left renal hilum. This retroperitoneal lymphadenopathy extends to the  pelvis where there are also bilateral external iliac lymph nodes (left greater than right), largest of which on the left side measures up to 15 mm.  The appearance of the liver, gallbladder, pancreas and bilateral adrenal glands is unremarkable. There are numerous  nonobstructive calculi within the collecting systems of the kidneys bilaterally, largest of which measures 11 mm in the upper pole collecting system of the right kidney. In addition, there appear to be 2 calculi in the proximal right ureter just beyond the right ureteropelvic junction, largest of which measures 1 cm. Mild fullness in the renal collecting systems bilaterally, but no frank hydroureteronephrosis or perinephric stranding to suggest significant urinary tract obstruction at this time. No bladder calculi identified.  Trace volume of ascites in the low anatomic pelvis. No pneumoperitoneum. No pathologic distention of small bowel. Prostate gland and urinary bladder are unremarkable in appearance.  Musculoskeletal: There are no aggressive appearing lytic or blastic lesions noted in the visualized portions of the skeleton.  IMPRESSION: 1. Extensive lymphadenopathy in the chest, abdomen and pelvis, with massive splenomegaly, as detailed above, highly concerning for lymphoma. 2. Numerous nonobstructive calculi within the collecting systems of the kidneys bilaterally, largest of which on the right side measure up to 11 mm. In addition, there are 2 calculi in the proximal right ureter just distal to the right ureteropelvic junction, largest of which measures up to 1 cm. Despite this, there does not appear to be significant urinary tract obstruction at this time. 3. Trace volume of ascites. 4. Small amount of pericardial fluid and/or thickening, unlikely to be of hemodynamic significance at this time.   Electronically Signed   By: Vinnie Langton M.D.   On: 08/04/2013 14:42    CBC  Recent Labs Lab 08/04/13 0339 08/06/13 0451 08/07/13 0430  08/08/13 0307 08/09/13 0327 08/10/13 0515  WBC 3.3* 4.3 4.0 4.3  --   --   HGB 7.1* 7.3* 7.8* 7.6* 7.9* 8.0*  HCT 22.5* 23.5* 25.3* 24.7* 25.9* 25.9*  PLT 86* 87* 78* 78*  --   --   MCV 94.9 97.5 98.4 98.4  --   --   MCH 30.0 30.3 30.4 30.3  --   --   MCHC 31.6 31.1 30.8 30.8  --   --   RDW 24.0* 24.7* 23.1* 22.6*  --   --     Chemistries   Recent Labs Lab 08/04/13 0339  NA 141  K 3.6*  CL 111  CO2 20  GLUCOSE 84  BUN 12  CREATININE 0.71  CALCIUM 7.5*   ------------------------------------------------------------------------------------------------------------------ estimated creatinine clearance is 92.2 ml/min (by C-G formula based on Cr of 0.71). ------------------------------------------------------------------------------------------------------------------ No results found for this basename: HGBA1C,  in the last 72 hours ------------------------------------------------------------------------------------------------------------------ No results found for this basename: CHOL, HDL, LDLCALC, TRIG, CHOLHDL, LDLDIRECT,  in the last 72 hours ------------------------------------------------------------------------------------------------------------------ No results found for this basename: TSH, T4TOTAL, FREET3, T3FREE, THYROIDAB,  in the last 72 hours ------------------------------------------------------------------------------------------------------------------ No results found for this basename: VITAMINB12, FOLATE, FERRITIN, TIBC, IRON, RETICCTPCT,  in the last 72 hours  Coagulation profile No results found for this basename: INR, PROTIME,  in the last 168 hours  No results found for this basename: DDIMER,  in the last 72 hours  Cardiac Enzymes  Recent Labs Lab 08/03/13 1005 08/03/13 1608  TROPONINI <0.30 <0.30   ------------------------------------------------------------------------------------------------------------------ No components found with this  basename: POCBNP,     Tinea Nobile D.O. on 08/10/2013 at 8:32 AM  Between 7am to 7pm - Pager - [phone]  After 7pm go to [email or web address] - password [email or web address]  And look for the night coverage person covering for me after hours  [facility] Office  [phone]

## 2013-08-10 NOTE — Discharge Instructions (Signed)
Incision Care ° An incision is a surgical cut to open your skin. You need to take care of your incision. This helps you to not get an infection. °HOME CARE °· Only take medicine as told by your doctor. °· Do not take off your bandage (dressing) or get your incision wet until your doctor approves. Change the bandage and call your doctor if the bandage gets wet, dirty, or starts to smell. °· Take showers. Do not take baths, swim, or do anything that may soak your incision until it heals. °· Return to your normal diet and activities as told or allowed by your doctor. °· Avoid lifting any weight until your doctor approves. °· Put medicine that helps lessen itching on your incision as told by your doctor. Do not pick or scratch at your incision. °· Keep your doctor visit to have your stitches (sutures) or staples removed. °· Drink enough fluids to keep your pee (urine) clear or pale yellow. °GET HELP RIGHT AWAY IF: °· You have a fever. °· You have a rash. °· You are dizzy, or you pass out (faint) while standing. °· You have trouble breathing. °· You have a reaction or side effects to medicine given to you. °· You have redness, puffiness (swelling), or more pain in the incision and medicine does not help. °· You have fluid, blood, or yellowish-white fluid (pus) coming from the incision lasting over 1 day. °· You have muscle aches, chills, or you feel sick. °· You have a bad smell coming from the incision or bandage. °· Your incision opens up after stitches, staples, or sticky strips have been removed. °· You keep feeling sick to your stomach (nauseous) or keep throwing up (vomiting). °MAKE SURE YOU:  °· Understand these instructions. °· Will watch your condition. °· Will get help right away if you are not doing well or get worse. °Document Released: 10/02/2011 Document Reviewed: 10/02/2011 °[employer]® Patient Information ©2014 [employer]. ° °

## 2013-08-11 MED ORDER — PANTOPRAZOLE SODIUM 40 MG PO TBEC
40.0000 mg | DELAYED_RELEASE_TABLET | Freq: Two times a day (BID) | ORAL | Status: DC
Start: 2013-08-11 — End: 2014-01-17

## 2013-08-11 NOTE — Discharge Summary (Signed)
Physician Discharge Summary  Thomas [name] XTG:[id number] DOB: [birth year] DOA: 08/02/2013  PCP: Philis Fendt, MD  Admit date: 08/02/2013 Discharge date: 08/11/2013  Time spent: 35 minutes  Recommendations for Outpatient Follow-up:  Patient will be discharged home. He should continue taking his medications as prescribed. Patient will need to follow up with his primary care physician within one week of discharge. He will also need to follow up with Dr. Dema Severin at the specified time. Patient will need to be followed by oncology.   Discharge Diagnoses:  Principal Problem:   Anemia, unspecified Active Problems:   Anemia   Iron deficiency anemia, unspecified   Protein-calorie malnutrition, severe   Discharge Condition: Stable  Diet recommendation: Regular  Filed Weights   08/02/13 1705 08/02/13 1949 08/03/13 1135  Weight: 47.628 kg (105 lb) 48.3 kg (106 lb 7.7 oz) 48.3 kg (106 lb 7.7 oz)    History of present illness:  Thomas [name] is a 33 y.o. male, with past medical history significant for HIV, diagnosed in October 2013 and placed on Atripla since then, presenting with altered mental status, anxiety , sweating and shaking .  The patient has been having extreme weight loss for the last couple of weeks with frequent black stools at around 1-2 times per day. No nausea or vomiting, no chest pain or shortness of breath.  In the emergency room his hemoglobin was found to be 5.6 and I was called to admit. He was also noted to have supraclavicular lymphadenopathy, bilaterally.    Hospital Course:  Symptomatic anemia, iron deficiency  -No longer symptomatic at this time  -H/H 8.0/25.9, will need monitoring and possible further work up -No longer tachycardic and hypotensive, has received 3 units since admission  -FOBT was negative, GI was consulted and did recommend endoscopy at this time  -Hematology consulted  -Cardiothoracic surg consulted for supraclavicular lymph node biopsy,  done 1/16  -Patient has been followed by Dr. Bobbye Charleston data in the past and was seen in December 2014 and was placed on iron supplementation, with plans for IV iron   Hypotension with hypovolemia  -Infectious versus hypovolemia (from anemia) versus medication induced  -To date, urine and blood cultures as well as stool cultures negative  -C. difficile PCR negative  -Atenolol discontinued on 08/02/2013  -TSH as well as cortisol levels were in normal limits   HIV/AIDS  -Continue atripla  -Infectious disease consulted, patient will need to follow up with his ID physician -CD4 count 660   Extensive lymphadenopathy and massive splenomegaly  -CT scan of the abdomen and chest with extensive lymphadenopathy  -ENT, Dr. Janace Hoard consulted for possible lymph node biopsy, , however verbal altercation with patient's mother  -Thoracic surgery consulted, Dr. Cyndia Bent, biopsy conducted, pending results  -Patient will need to follow up with Dr. Cyndia Bent as an outpatient as well as Dr. Shadad/oncology for biopsy results -Patient did not wish to remain inpatient for results  Altered mental status  -Resolved, patient is back to his baseline  -CT of the head was negative   Protein malnutrition  -Consulted nutrition  -Continue feeding supplementation   Constipation  -Improved, continue over the counter stool softeners.  Procedures: Right supraclavicular lymph node biopsy  Consultations: Infectious disease  Oncology  Thoracic surgery  Interventional radiology  Discharge Exam: Filed Vitals:   08/11/13 0611  BP: 90/58  Pulse: 108  Temp: 98.2 F (36.8 C)  Resp: 18   Exam  General: Well developed, malnourished, NAD, appears stated age  43: NCAT, PERRLA,  EOMI, Anicteic Sclera, mucous membranes moist.  Neck: Supple, no JVD, bilateral supraclavicular lymph nodes present  Cardiovascular: S1 S2 auscultated, Regular rate and rhythm.  Respiratory: Clear to auscultation bilaterally with equal chest rise    Abdomen: Soft, nontender, nondistended, + bowel sounds  Extremities: warm dry without cyanosis clubbing or edema  Neuro: AAOx3, cranial nerves grossly intact.  Skin: Without rashes exudates or nodules  Psych: Normal affect and demeanor with intact judgement and insight  Discharge Instructions  Discharge Orders   Future Appointments Provider Department Dept Phone   08/19/2013 11:00 AM Gardiner Barefoot, MD [facility] [phone]   08/19/2013 4:00 PM Alleen Borne, MD [facility] [phone]   09/30/2013 3:00 PM [facility] [phone]   09/30/2013 3:30 PM Benjiman Core, MD [facility] [phone]   Future Orders Complete By Expires   Discharge instructions  As directed    Comments:     Patient will be discharged home. He should continue taking his medications as prescribed. Patient will need to follow up with his primary care physician within one week of discharge. He will also need to follow up with Dr. Carolyn Stare at the specified time. Patient will need to be followed by oncology.   Increase activity slowly  As directed        Medication List    STOP taking these medications       atenolol 25 MG tablet  Commonly known as:  TENORMIN     ENSURE      TAKE these medications       efavirenz-emtricitabine-tenofovir 600-200-300 MG per tablet  Commonly known as:  ATRIPLA  Take 1 tablet by mouth at bedtime.     ferrous sulfate 325 (65 FE) MG tablet  Take 1 tablet (325 mg total) by mouth 2 (two) times daily with a meal.     pantoprazole 40 MG tablet  Commonly known as:  PROTONIX  Take 1 tablet (40 mg total) by mouth 2 (two) times daily before a meal.       Allergies  Allergen Reactions  . Tylenol [Acetaminophen]     sweats       Follow-up Information   Follow up with Alleen Borne, MD On 08/19/2013. (Office will  contact you with appointment at 4 pm)    Specialty:  Cardiothoracic Surgery   Contact information:   [address] [place] [address] [phone]       Call Eli Hose, MD.   Specialty:  Oncology   Contact information:   [address] [place] [address] [phone]       Follow up with Fleet Contras A, MD. Schedule an appointment as soon as possible for a visit in 1 week.   Specialty:  Internal Medicine   Contact information:   [address] [place] [address] [phone]        The results of significant diagnostics from this hospitalization (including imaging, microbiology, ancillary and laboratory) are listed below for reference.    Significant Diagnostic Studies: Dg Chest 2 View  08/02/2013   CLINICAL DATA:  Shortness of breath and chest pain  EXAM: CHEST  2 VIEW  COMPARISON:  06/09/2008  FINDINGS: The heart size and mediastinal contours are within normal limits. Both lungs are clear. The visualized skeletal structures are unremarkable.  IMPRESSION: No active cardiopulmonary disease.   Electronically Signed  By: Inez Catalina M.D.   On: 08/02/2013 17:20   Ct Head Wo Contrast  08/02/2013   CLINICAL DATA:  Dizziness, headache.  EXAM: CT HEAD WITHOUT CONTRAST  TECHNIQUE: Contiguous axial images were obtained from the base of the skull through the vertex without intravenous contrast.  COMPARISON:  None.  FINDINGS: Bony calvarium appears intact. No mass effect or midline shift is noted. Ventricular size is within normal limits. There is no evidence of mass lesion, hemorrhage or acute infarction.  IMPRESSION: No gross intracranial abnormality seen.   Electronically Signed   By: Sabino Dick M.D.   On: 08/02/2013 17:04   Ct Chest W Contrast  08/04/2013   CLINICAL DATA:  Anemia, fever and weight loss. Evaluate for potential lymphoma. HIV-positive patient.  EXAM: CT CHEST, ABDOMEN AND PELVIS WITHOUT CONTRAST  TECHNIQUE: Multidetector CT imaging of the  chest, abdomen and pelvis was performed following the standard protocol without IV contrast.  COMPARISON:  No priors.  FINDINGS: CT CHEST FINDINGS  Mediastinum: Numerous borderline enlarged and mildly enlarged mediastinal lymph nodes are noted, largest of which include a 12 mm low left paratracheal lymph node and a 11 mm lymph node in the superior mediastinum immediately to the right of the innominate artery. Enlarged retrocrural and posterior mediastinal lymph nodes measuring up to 15 mm in short axis. Heart size is normal. Small amount of pericardial fluid and/or thickening, unlikely to be of hemodynamic significance at this time. No associated pericardial calcification. Esophagus is unremarkable in appearance.  Lungs/Pleura: No acute consolidative airspace disease. No pleural effusions. No suspicious appearing pulmonary nodules or masses.  Musculoskeletal: There are no aggressive appearing lytic or blastic lesions noted in the visualized portions of the skeleton.  CT ABDOMEN AND PELVIS FINDINGS  Abdomen/Pelvis: The spleen is massively enlarged measuring 14.3 x 7.5 x 24.0 cm (estimated splenic volume of 1,287 mL), and the spleen is diffusely heterogeneous in attenuation/enhancement, suggesting diffuse infiltration by a innumerable lesions, presumably lymphomatous. There is extensive lymphadenopathy throughout the retroperitoneum and upper abdomen. Numerous enlarged lymph nodes in the celiac axis are noted, best demonstrated on image 58 of series 2 retroperitoneal lymph nodes measuring up to 21 mm in short axis are noted in the left para-aortic station anterior to the left renal hilum. This retroperitoneal lymphadenopathy extends to the pelvis where there are also bilateral external iliac lymph nodes (left greater than right), largest of which on the left side measures up to 15 mm.  The appearance of the liver, gallbladder, pancreas and bilateral adrenal glands is unremarkable. There are numerous nonobstructive  calculi within the collecting systems of the kidneys bilaterally, largest of which measures 11 mm in the upper pole collecting system of the right kidney. In addition, there appear to be 2 calculi in the proximal right ureter just beyond the right ureteropelvic junction, largest of which measures 1 cm. Mild fullness in the renal collecting systems bilaterally, but no frank hydroureteronephrosis or perinephric stranding to suggest significant urinary tract obstruction at this time. No bladder calculi identified.  Trace volume of ascites in the low anatomic pelvis. No pneumoperitoneum. No pathologic distention of small bowel. Prostate gland and urinary bladder are unremarkable in appearance.  Musculoskeletal: There are no aggressive appearing lytic or blastic lesions noted in the visualized portions of the skeleton.  IMPRESSION: 1. Extensive lymphadenopathy in the chest, abdomen and pelvis, with massive splenomegaly, as detailed above, highly concerning for lymphoma. 2. Numerous nonobstructive calculi within the collecting systems of the kidneys bilaterally, largest of  which on the right side measure up to 11 mm. In addition, there are 2 calculi in the proximal right ureter just distal to the right ureteropelvic junction, largest of which measures up to 1 cm. Despite this, there does not appear to be significant urinary tract obstruction at this time. 3. Trace volume of ascites. 4. Small amount of pericardial fluid and/or thickening, unlikely to be of hemodynamic significance at this time.   Electronically Signed   By: Vinnie Langton M.D.   On: 08/04/2013 14:42   Ct Abdomen Pelvis W Contrast  08/04/2013   CLINICAL DATA:  Anemia, fever and weight loss. Evaluate for potential lymphoma. HIV-positive patient.  EXAM: CT CHEST, ABDOMEN AND PELVIS WITHOUT CONTRAST  TECHNIQUE: Multidetector CT imaging of the chest, abdomen and pelvis was performed following the standard protocol without IV contrast.  COMPARISON:  No  priors.  FINDINGS: CT CHEST FINDINGS  Mediastinum: Numerous borderline enlarged and mildly enlarged mediastinal lymph nodes are noted, largest of which include a 12 mm low left paratracheal lymph node and a 11 mm lymph node in the superior mediastinum immediately to the right of the innominate artery. Enlarged retrocrural and posterior mediastinal lymph nodes measuring up to 15 mm in short axis. Heart size is normal. Small amount of pericardial fluid and/or thickening, unlikely to be of hemodynamic significance at this time. No associated pericardial calcification. Esophagus is unremarkable in appearance.  Lungs/Pleura: No acute consolidative airspace disease. No pleural effusions. No suspicious appearing pulmonary nodules or masses.  Musculoskeletal: There are no aggressive appearing lytic or blastic lesions noted in the visualized portions of the skeleton.  CT ABDOMEN AND PELVIS FINDINGS  Abdomen/Pelvis: The spleen is massively enlarged measuring 14.3 x 7.5 x 24.0 cm (estimated splenic volume of 1,287 mL), and the spleen is diffusely heterogeneous in attenuation/enhancement, suggesting diffuse infiltration by a innumerable lesions, presumably lymphomatous. There is extensive lymphadenopathy throughout the retroperitoneum and upper abdomen. Numerous enlarged lymph nodes in the celiac axis are noted, best demonstrated on image 58 of series 2 retroperitoneal lymph nodes measuring up to 21 mm in short axis are noted in the left para-aortic station anterior to the left renal hilum. This retroperitoneal lymphadenopathy extends to the pelvis where there are also bilateral external iliac lymph nodes (left greater than right), largest of which on the left side measures up to 15 mm.  The appearance of the liver, gallbladder, pancreas and bilateral adrenal glands is unremarkable. There are numerous nonobstructive calculi within the collecting systems of the kidneys bilaterally, largest of which measures 11 mm in the upper  pole collecting system of the right kidney. In addition, there appear to be 2 calculi in the proximal right ureter just beyond the right ureteropelvic junction, largest of which measures 1 cm. Mild fullness in the renal collecting systems bilaterally, but no frank hydroureteronephrosis or perinephric stranding to suggest significant urinary tract obstruction at this time. No bladder calculi identified.  Trace volume of ascites in the low anatomic pelvis. No pneumoperitoneum. No pathologic distention of small bowel. Prostate gland and urinary bladder are unremarkable in appearance.  Musculoskeletal: There are no aggressive appearing lytic or blastic lesions noted in the visualized portions of the skeleton.  IMPRESSION: 1. Extensive lymphadenopathy in the chest, abdomen and pelvis, with massive splenomegaly, as detailed above, highly concerning for lymphoma. 2. Numerous nonobstructive calculi within the collecting systems of the kidneys bilaterally, largest of which on the right side measure up to 11 mm. In addition, there are 2 calculi in the  proximal right ureter just distal to the right ureteropelvic junction, largest of which measures up to 1 cm. Despite this, there does not appear to be significant urinary tract obstruction at this time. 3. Trace volume of ascites. 4. Small amount of pericardial fluid and/or thickening, unlikely to be of hemodynamic significance at this time.   Electronically Signed   By: Vinnie Langton M.D.   On: 08/04/2013 14:42    Microbiology: Recent Results (from the past 240 hour(s))  CMV CULTURE CMVC     Status: None   Collection Time    08/02/13  9:12 PM      Result Value Range Status   Specimen Description NASAL SWAB   Final   Special Requests NONE   Final   Culture     Final   Value: No Cytomegalovirus identified in cell culture                                                                A negative result does not exclude the possibility of CMV infection;inappropriate  specimen collection,storage and transport may lead to false      negative results.     Performed at [facility]   Report Status 08/07/2013 FINAL   Final  GC/CHLAMYDIA PROBE AMP     Status: None   Collection Time    08/03/13  2:04 PM      Result Value Range Status   CT Probe RNA NEGATIVE  NEGATIVE Final   GC Probe RNA NEGATIVE  NEGATIVE Final   Comment: (NOTE)                                                                                               **Normal Reference Range: Negative**          Assay performed using the Gen-Probe APTIMA COMBO2 (R) Assay.     Acceptable specimen types for this assay include APTIMA Swabs (Unisex,     endocervical, urethral, or vaginal), first void urine, and ThinPrep     liquid based cytology samples.     Performed at [facility]     Status: None   Collection Time    08/03/13  2:04 PM      Result Value Range Status   Specimen Description URINE, RANDOM   Final   Special Requests NONE   Final   Culture  Setup Time     Final   Value: 08/03/2013 18:59     Performed at [place]     Final   Value: >=100,000 COLONIES/ML     Performed at [facility]   Culture     Final   Value: STAPHYLOCOCCUS SPECIES (COAGULASE NEGATIVE)     Note: RIFAMPIN AND GENTAMICIN SHOULD NOT BE USED AS SINGLE DRUGS FOR TREATMENT OF STAPH INFECTIONS.     Performed  at [facility]   Report Status 08/05/2013 FINAL   Final   Organism ID, Bacteria STAPHYLOCOCCUS SPECIES (COAGULASE NEGATIVE)   Final  CULTURE, BLOOD (ROUTINE X 2)     Status: None   Collection Time    08/03/13  2:14 PM      Result Value Range Status   Specimen Description BLOOD RIGHT ARM   Final   Special Requests BOTTLES DRAWN AEROBIC AND ANAEROBIC 10CC EACH   Final   Culture  Setup Time     Final   Value: 08/03/2013 22:51     Performed at [facility]   Culture     Final   Value: NO GROWTH 5 DAYS     Performed at [facility]   Report Status 08/09/2013 FINAL   Final  AFB CULTURE, BLOOD     Status: None   Collection Time    08/03/13  2:14 PM      Result Value Range Status   Specimen Description BLOOD RIGHT ARM   Final   Special Requests BOTTLES DRAWN AEROBIC ONLY 5CC   Final   Culture     Final   Value: CULTURE WILL BE EXAMINED FOR 6 WEEKS BEFORE ISSUING A FINAL REPORT     Performed at [facility]   Report Status PENDING   Incomplete  STOOL CULTURE     Status: None   Collection Time    08/03/13 10:48 PM      Result Value Range Status   Specimen Description STOOL   Final   Special Requests NONE   Final   Culture     Final   Value: NO SALMONELLA, SHIGELLA, CAMPYLOBACTER, YERSINIA, OR E.COLI 0157:H7 ISOLATED     Performed at [facility]   Report Status 08/08/2013 FINAL   Final  CLOSTRIDIUM DIFFICILE BY PCR     Status: None   Collection Time    08/04/13 10:07 PM      Result Value Range Status   C difficile by pcr NEGATIVE  NEGATIVE Final  SURGICAL PCR SCREEN     Status: None   Collection Time    08/08/13  7:49 AM      Result Value Range Status   MRSA, PCR NEGATIVE  NEGATIVE Final   Staphylococcus aureus NEGATIVE  NEGATIVE Final   Comment:            The Xpert SA Assay (FDA     approved for NASAL specimens     in patients over 44 years of age),     is one component of     a comprehensive surveillance     program.  Test performance has     been validated by [employer] for patients greater     than or equal to 71 year old.     It is not intended     to diagnose infection nor to     guide or monitor treatment.     Labs: Basic Metabolic Panel: No results found for this basename: NA, K, CL, CO2, GLUCOSE, BUN, CREATININE, CALCIUM, MG, PHOS,  in the last 168 hours Liver Function Tests: No results found for this basename: AST, ALT, ALKPHOS, BILITOT, PROT, ALBUMIN,  in the last 168 hours No results found for this basename: LIPASE, AMYLASE,  in the last 168 hours No  results found for this basename: AMMONIA,  in the last 168 hours CBC:  Recent Labs Lab 08/06/13 0451  08/07/13 0430 08/08/13 0307 08/09/13 0327 08/10/13 0515  WBC 4.3 4.0 4.3  --   --   HGB 7.3* 7.8* 7.6* 7.9* 8.0*  HCT 23.5* 25.3* 24.7* 25.9* 25.9*  MCV 97.5 98.4 98.4  --   --   PLT 87* 78* 78*  --   --    Cardiac Enzymes: No results found for this basename: CKTOTAL, CKMB, CKMBINDEX, TROPONINI,  in the last 168 hours BNP: BNP (last 3 results) No results found for this basename: PROBNP,  in the last 8760 hours CBG: No results found for this basename: GLUCAP,  in the last 168 hours     Signed:  Cristal Ford  [facility] 08/11/2013, 11:22 AM

## 2013-08-11 NOTE — Progress Notes (Signed)
Discharge home. Home discharge instruction given, no question verbalized. 

## 2013-08-11 NOTE — Progress Notes (Addendum)
      [address]       [place] [address]             [phone]      2 Days Post-Op Procedure(s) (LRB): SUPRACLAVICULAR LYMPH NODE BIOPSY (Right)  Subjective:  His only complaint is itching around dermabond.  He is going home today  Objective: Vital signs in last 24 hours: Temp:  [98.2 F (36.8 C)-100.7 F (38.2 C)] 98.2 F (36.8 C) (01/19 0611) Pulse Rate:  [106-122] 108 (01/19 0611) Cardiac Rhythm:  [-]  Resp:  [18] 18 (01/19 0611) BP: (90-104)/(58-69) 90/58 mmHg (01/19 0611) SpO2:  [100 %] 100 % (01/19 0611)  Intake/Output from previous day: 01/18 0701 - 01/19 0700 In: 2312.5 [P.O.:490; I.V.:1822.5] Out: 3975 [Urine:3975]  General appearance: alert, cooperative and no distress Heart: regular rate and rhythm Lungs: clear to auscultation bilaterally Wound: clean and dry. Derma bond intact. No signs of cellulitis.  Lab Results:  Recent Labs  08/09/13 0327 08/10/13 0515  HGB 7.9* 8.0*  HCT 25.9* 25.9*   BMET: No results found for this basename: NA, K, CL, CO2, GLUCOSE, BUN, CREATININE, CALCIUM,  in the last 72 hours  PT/INR: No results found for this basename: LABPROT, INR,  in the last 72 hours ABG    Component Value Date/Time   TCO2 28 02/20/2012 1421   CBG (last 3)  No results found for this basename: GLUCAP,  in the last 72 hours  Assessment/Plan: S/P Procedure(s) (LRB): SUPRACLAVICULAR LYMPH NODE BIOPSY (Right)   His wound looks good, will see patient in 1 week for wound check. Pathology result is pending. Disposition per primary     LOS: 9 days    Jayshawn Colston M PA-C 08/11/2013

## 2013-08-12 ENCOUNTER — Encounter (HOSPITAL_COMMUNITY): Payer: Self-pay | Admitting: Surgery

## 2013-08-14 ENCOUNTER — Telehealth: Payer: Self-pay | Admitting: Medical Oncology

## 2013-08-14 ENCOUNTER — Other Ambulatory Visit: Payer: Self-pay | Admitting: Hematology & Oncology

## 2013-08-14 ENCOUNTER — Other Ambulatory Visit (HOSPITAL_COMMUNITY): Payer: Self-pay | Admitting: Radiology

## 2013-08-14 ENCOUNTER — Telehealth: Payer: Self-pay | Admitting: Hematology & Oncology

## 2013-08-14 DIAGNOSIS — C8110 Nodular sclerosis classical Hodgkin lymphoma, unspecified site: Secondary | ICD-10-CM

## 2013-08-14 DIAGNOSIS — I429 Cardiomyopathy, unspecified: Secondary | ICD-10-CM

## 2013-08-14 NOTE — Telephone Encounter (Signed)
Liliane Channel, from Dr. Antonieta Pert office called. Dr. Marin Olp would like for Dr. Alen Blew to see patient 08/25/13. Rick informed Dr. Alen Blew out of office till next week. Mssg placed in inbox.  Patient with schedule appts: PET 01/23, CT Biopsy 01/28, MUGA 01/29  Patient currently sched with Dr. Alen Blew 03/10.

## 2013-08-14 NOTE — Telephone Encounter (Signed)
Pt aware of 1-23 PET from [place], 1-28 BMBX to be NPO after midnight and he needs a driver. Pt aware of 1-29 MUGA. Rubin Payor RN for Dr. Alen Blew aware pt needs appointment to see MD 08-25-12 and will let pt know.

## 2013-08-15 ENCOUNTER — Telehealth: Payer: Self-pay | Admitting: Hematology & Oncology

## 2013-08-15 ENCOUNTER — Ambulatory Visit (HOSPITAL_COMMUNITY): Payer: Medicare Other

## 2013-08-15 NOTE — Telephone Encounter (Signed)
Received call from [place], pt was no show for PET. I left message on pt cell to call. Dr. Marin Olp aware

## 2013-08-15 NOTE — Telephone Encounter (Signed)
I called and left a message on his phone that he has Hodgkin's disease. I told him that this is very curable with chemotherapy and that the only treatment is chemotherapy.  I told him that we have him set up with his tests that need to be done before any treatment can be given. He apparently did not make his PET scan today.  He has an appointment with Dr. Alen Blew next week.  Again, I told him that this Hodgkin's disease is very curable but that chemotherapy is necessary to do this.  Laurey Arrow

## 2013-08-18 ENCOUNTER — Other Ambulatory Visit: Payer: Self-pay | Admitting: Oncology

## 2013-08-18 ENCOUNTER — Telehealth: Payer: Self-pay | Admitting: Oncology

## 2013-08-18 DIAGNOSIS — B20 Human immunodeficiency virus [HIV] disease: Secondary | ICD-10-CM

## 2013-08-18 DIAGNOSIS — C8190 Hodgkin lymphoma, unspecified, unspecified site: Secondary | ICD-10-CM

## 2013-08-18 NOTE — Telephone Encounter (Signed)
s.w. pt and advised on Feb apt....pt ok adn aware

## 2013-08-19 ENCOUNTER — Encounter (HOSPITAL_COMMUNITY): Payer: Self-pay | Admitting: Pharmacy Technician

## 2013-08-19 ENCOUNTER — Ambulatory Visit: Payer: Medicare Other | Admitting: Internal Medicine

## 2013-08-19 ENCOUNTER — Ambulatory Visit: Payer: Medicare Other | Admitting: Surgery

## 2013-08-19 ENCOUNTER — Telehealth: Payer: Self-pay | Admitting: Hematology & Oncology

## 2013-08-19 NOTE — Telephone Encounter (Signed)
Dixie RN with Dr. Alen Blew aware scheduling talked to pt about biopsy and he put his mom on the phone and they are thinking about not going for appointment.

## 2013-08-20 ENCOUNTER — Telehealth (HOSPITAL_COMMUNITY): Payer: Self-pay | Admitting: *Deleted

## 2013-08-20 ENCOUNTER — Ambulatory Visit (HOSPITAL_COMMUNITY): Admission: RE | Admit: 2013-08-20 | Payer: Medicare Other | Source: Ambulatory Visit

## 2013-08-20 ENCOUNTER — Ambulatory Visit (HOSPITAL_COMMUNITY)
Admission: RE | Admit: 2013-08-20 | Discharge: 2013-08-20 | Disposition: A | Discharge status: Home / Self Care | Payer: Medicare Other | Source: Ambulatory Visit | Attending: Hematology & Oncology | Admitting: Hematology & Oncology

## 2013-08-20 ENCOUNTER — Telehealth: Payer: Self-pay | Admitting: *Deleted

## 2013-08-20 NOTE — Telephone Encounter (Signed)
Received call from Concha Se, patient's case manager with concerns of patient missing appointments. He canceled appts for a bone marrow biopsy and missed appt with Dr. Linus Salmons. Called and spoke with patient's Mom and she said she wanted the results of his lymph node biopsy before the next biopsy is scheduled. She placed a call to Dr. Vivi Martens office regarding this. Patient's appt with Dr. Linus Salmons rescheduled for 08/28/13 at 4:00 pm. Myrtis Hopping

## 2013-08-21 ENCOUNTER — Ambulatory Visit: Payer: Medicare Other

## 2013-08-21 ENCOUNTER — Ambulatory Visit (INDEPENDENT_AMBULATORY_CARE_PROVIDER_SITE_OTHER): Payer: Self-pay | Admitting: Surgery

## 2013-08-21 ENCOUNTER — Encounter: Payer: Self-pay | Admitting: Surgery

## 2013-08-21 ENCOUNTER — Encounter (HOSPITAL_COMMUNITY): Payer: Medicare Other

## 2013-08-21 VITALS — BP 106/54 | HR 115 | Resp 18 | Ht 73.0 in | Wt 127.0 lb

## 2013-08-21 DIAGNOSIS — R599 Enlarged lymph nodes, unspecified: Secondary | ICD-10-CM

## 2013-08-21 DIAGNOSIS — C8190 Hodgkin lymphoma, unspecified, unspecified site: Secondary | ICD-10-CM

## 2013-08-21 DIAGNOSIS — Z9889 Other specified postprocedural states: Secondary | ICD-10-CM

## 2013-08-21 DIAGNOSIS — R590 Localized enlarged lymph nodes: Secondary | ICD-10-CM

## 2013-08-21 NOTE — Progress Notes (Signed)
     HPI:  Patient returns for routine postoperative follow-up having undergone excisional biopsy of a right supraclavicular lymph node  on 08/08/2013. The final pathology showed Hodgkin's lymphoma. The patient's early postoperative recovery while in the hospital was notable for an uncomplicated postop course. Since hospital discharge the patient reports no change in his condition with generalized weakness, poor appetite and failure to thrive. His mother is with him today and she is upset because he was scheduled for other tests for workup of his lymphoma, particularly a bone marrow biopsy which she did not think he was strong enough to have done.   Current Outpatient Prescriptions  Medication Sig Dispense Refill  . atenolol (TENORMIN) 25 MG tablet Take 25 mg by mouth every morning.      Marland Kitchen efavirenz-emtricitabine-tenofovir (ATRIPLA) 600-200-300 MG per tablet Take 1 tablet by mouth at bedtime.      . ferrous sulfate 325 (65 FE) MG tablet Take 1 tablet (325 mg total) by mouth 2 (two) times daily with a meal.  60 tablet  3  . pantoprazole (PROTONIX) 40 MG tablet Take 1 tablet (40 mg total) by mouth 2 (two) times daily before a meal.  30 tablet  0   No current facility-administered medications for this visit.    Physical Exam: BP 106/54  Pulse 115  Resp 18  Ht _0  (1.854 m)  Wt 127 lb (57.607 kg)  BMI 16.76 kg/m2  SpO2 98% He looks cachectic and weak The biopsy site is healing well   Impression:  He has HIV disease and Hodgkin's lymphoma with progressive weight loss and fatigue. I reviewed the pathology again with the patient and his mother, the need for rapid work up and treatment. I discussed the need for the further testing that has been ordered by oncology and strongly encouraged them to proceed with that.  Plan:  The patient's mother said that she would contact Dr. Hazeline Junker office to reschedule the PET, BMBX, and MUGA.

## 2013-08-26 ENCOUNTER — Telehealth: Payer: Self-pay | Admitting: Oncology

## 2013-08-26 ENCOUNTER — Encounter: Payer: Self-pay | Admitting: Oncology

## 2013-08-26 ENCOUNTER — Ambulatory Visit (HOSPITAL_BASED_OUTPATIENT_CLINIC_OR_DEPARTMENT_OTHER): Payer: Medicare Other | Admitting: Oncology

## 2013-08-26 ENCOUNTER — Other Ambulatory Visit (HOSPITAL_BASED_OUTPATIENT_CLINIC_OR_DEPARTMENT_OTHER): Payer: Medicare Other

## 2013-08-26 VITALS — BP 94/52 | HR 107 | Temp 98.0°F | Resp 18 | Ht 73.0 in | Wt 124.0 lb

## 2013-08-26 DIAGNOSIS — D649 Anemia, unspecified: Secondary | ICD-10-CM | POA: Diagnosis not present

## 2013-08-26 DIAGNOSIS — Z21 Asymptomatic human immunodeficiency virus [HIV] infection status: Secondary | ICD-10-CM | POA: Diagnosis not present

## 2013-08-26 DIAGNOSIS — C8198 Hodgkin lymphoma, unspecified, lymph nodes of multiple sites: Secondary | ICD-10-CM

## 2013-08-26 DIAGNOSIS — B20 Human immunodeficiency virus [HIV] disease: Secondary | ICD-10-CM | POA: Diagnosis not present

## 2013-08-26 DIAGNOSIS — R599 Enlarged lymph nodes, unspecified: Secondary | ICD-10-CM | POA: Diagnosis not present

## 2013-08-26 DIAGNOSIS — R634 Abnormal weight loss: Secondary | ICD-10-CM | POA: Diagnosis not present

## 2013-08-26 DIAGNOSIS — C8190 Hodgkin lymphoma, unspecified, unspecified site: Secondary | ICD-10-CM | POA: Insufficient documentation

## 2013-08-26 DIAGNOSIS — D72819 Decreased white blood cell count, unspecified: Secondary | ICD-10-CM | POA: Diagnosis not present

## 2013-08-26 DIAGNOSIS — Z23 Encounter for immunization: Secondary | ICD-10-CM | POA: Diagnosis not present

## 2013-08-26 DIAGNOSIS — L738 Other specified follicular disorders: Secondary | ICD-10-CM | POA: Diagnosis not present

## 2013-08-26 DIAGNOSIS — N201 Calculus of ureter: Secondary | ICD-10-CM | POA: Diagnosis not present

## 2013-08-26 LAB — COMPREHENSIVE METABOLIC PANEL (CC13)
ALK PHOS: 141 U/L (ref 40–150)
ALT: 18 U/L (ref 0–55)
AST: 28 U/L (ref 5–34)
Albumin: 2 g/dL — ABNORMAL LOW (ref 3.5–5.0)
Anion Gap: 12 mEq/L — ABNORMAL HIGH (ref 3–11)
BILIRUBIN TOTAL: 0.65 mg/dL (ref 0.20–1.20)
BUN: 16.7 mg/dL (ref 7.0–26.0)
CO2: 18 mEq/L — ABNORMAL LOW (ref 22–29)
CREATININE: 0.9 mg/dL (ref 0.7–1.3)
Calcium: 9.3 mg/dL (ref 8.4–10.4)
Chloride: 97 mEq/L — ABNORMAL LOW (ref 98–109)
Glucose: 97 mg/dl (ref 70–140)
Potassium: 4 mEq/L (ref 3.5–5.1)
Sodium: 127 mEq/L — ABNORMAL LOW (ref 136–145)
Total Protein: 7.2 g/dL (ref 6.4–8.3)

## 2013-08-26 LAB — CBC WITH DIFFERENTIAL/PLATELET
BASO%: 0.3 % (ref 0.0–2.0)
BASOS ABS: 0 10*3/uL (ref 0.0–0.1)
EOS ABS: 0 10*3/uL (ref 0.0–0.5)
EOS%: 0.1 % (ref 0.0–7.0)
HCT: 19.3 % — ABNORMAL LOW (ref 38.4–49.9)
HEMOGLOBIN: 6.3 g/dL — AB (ref 13.0–17.1)
LYMPH%: 23.1 % (ref 14.0–49.0)
MCH: 29.9 pg (ref 27.2–33.4)
MCHC: 32.7 g/dL (ref 32.0–36.0)
MCV: 91.4 fL (ref 79.3–98.0)
MONO#: 1 10*3/uL — ABNORMAL HIGH (ref 0.1–0.9)
MONO%: 15.7 % — AB (ref 0.0–14.0)
NEUT%: 60.8 % (ref 39.0–75.0)
NEUTROS ABS: 3.8 10*3/uL (ref 1.5–6.5)
PLATELETS: 137 10*3/uL — AB (ref 140–400)
RBC: 2.12 10*6/uL — ABNORMAL LOW (ref 4.20–5.82)
RDW: 20.4 % — ABNORMAL HIGH (ref 11.0–14.6)
WBC: 6.2 10*3/uL (ref 4.0–10.3)
lymph#: 1.4 10*3/uL (ref 0.9–3.3)

## 2013-08-26 LAB — TECHNOLOGIST REVIEW

## 2013-08-26 NOTE — Progress Notes (Signed)
Hematology and Oncology Follow Up Visit  Thomas [name] [id number] 07/03/1981 33 y.o. 08/26/2013 4:32 PM   Principle Diagnosis:  33 year old HIV positive gentleman diagnosed with Hodgkin's disease in January of 2015 after he presented with anemia and diffuse lymphadenopathy.   Prior Therapy: He is status post supraclavicular right lymph node biopsy done on 08/02/2013.  Current therapy: Under evaluation for to start chemotherapy after his staging workup to be completed.  Interim History:  Thomas [name] presents today for a followup visit accompanied by his mother. Since his discharge from the hospital, he has felt relatively better but still overall weak and reports fatigue on exertion. He was set up to undergo staging workup including a PET scan and a bone marrow biopsy but he did not show up to these appointments. Is not reporting any fevers or chills or sweats. Has not reported any pruritus or any constitutional symptoms. He is no longer reporting any lymphadenopathy. Is not reporting any hematochezia or melena or any bleeding problems. His mother is living with him and assisting him in his appointments and his care.  Medications: I have reviewed the patient's current medications.  Current Outpatient Prescriptions  Medication Sig Dispense Refill  . atenolol (TENORMIN) 25 MG tablet Take 25 mg by mouth every morning.      Marland Kitchen efavirenz-emtricitabine-tenofovir (ATRIPLA) 600-200-300 MG per tablet Take 1 tablet by mouth at bedtime.      . ferrous sulfate 325 (65 FE) MG tablet Take 1 tablet (325 mg total) by mouth 2 (two) times daily with a meal.  60 tablet  3  . pantoprazole (PROTONIX) 40 MG tablet Take 1 tablet (40 mg total) by mouth 2 (two) times daily before a meal.  30 tablet  0   No current facility-administered medications for this visit.     Allergies:  Allergies  Allergen Reactions  . Tylenol [Acetaminophen]     sweats    Past Medical History, Surgical history, Social history, and  Family History were reviewed and updated.  Review of Systems: Constitutional:  Negative for fever, chills, night sweats, anorexia, weight loss, pain. Cardiovascular: negative for - chest pain, dyspnea on exertion or palpitations Respiratory: positive for - cough, hemoptysis and orthopnea Neurological: negative for - behavioral changes, dizziness or visual changes Dermatological: negative for acne, eczema and lumps ENT: negative for - headaches, sinus pain, sneezing or sore throat Skin: Negative. Gastrointestinal: negative for - abdominal pain, diarrhea or hematemesis Genito-Urinary: negative for - change in urinary stream, dysuria or hematuria Hematological and Lymphatic: negative for - bruising, jaundice or night sweats Breast: negative Musculoskeletal: negative for - joint swelling, muscle pain or muscular weakness Remaining ROS negative. Physical Exam: Blood pressure 94/52, pulse 107, temperature 98 F (36.7 C), temperature source Oral, resp. rate 18, height 6' 1"  (1.854 m), weight 124 lb (56.246 kg). ECOG: 1 General appearance: alert, cooperative and appears stated age Head: Normocephalic, without obvious abnormality, atraumatic Neck: no adenopathy, no carotid bruit, no JVD, supple, symmetrical, trachea midline and thyroid not enlarged, symmetric, no tenderness/mass/nodules Lymph nodes: Cervical, supraclavicular, and axillary nodes normal. Heart:regular rate and rhythm, S1, S2 normal, no murmur, click, rub or gallop Lung:chest clear, no wheezing, rales, normal symmetric air entry Abdomin: soft, non-tender, without masses or organomegaly EXT:no erythema, induration, or nodules   Lab Results: Lab Results  Component Value Date   WBC 6.2 08/26/2013   HGB 6.3* 08/26/2013   HCT 19.3* 08/26/2013   MCV 91.4 08/26/2013   PLT 137* 08/26/2013  Chemistry      Component Value Date/Time   NA 127* 08/26/2013 1527   NA 141 08/04/2013 0339   K 4.0 08/26/2013 1527   K 3.6* 08/04/2013 0339   CL 111  08/04/2013 0339   CO2 18* 08/26/2013 1527   CO2 20 08/04/2013 0339   BUN 16.7 08/26/2013 1527   BUN 12 08/04/2013 0339   CREATININE 0.9 08/26/2013 1527   CREATININE 0.71 08/04/2013 0339   CREATININE 0.85 06/17/2013 1658      Component Value Date/Time   CALCIUM 9.3 08/26/2013 1527   CALCIUM 7.5* 08/04/2013 0339   ALKPHOS 141 08/26/2013 1527   ALKPHOS 123* 08/02/2013 1620   AST 28 08/26/2013 1527   AST 17 08/02/2013 1620   ALT 18 08/26/2013 1527   ALT 7 08/02/2013 1620   BILITOT 0.65 08/26/2013 1527   BILITOT 0.3 08/02/2013 1620       Impression and Plan:  33 year old gentleman with the following issues:  1. Hodgkin's disease presented with lymphadenopathy above and below the diaphragm without any constitutional symptoms indicating at least stage IIIA. His PET scan and bone marrow biopsy has not been done yet. I had a lengthy discussion today discussing the natural course of this disease with Thomas [name] and his mother that accompanied him today. I explained to him that systemic chemotherapy in the form of ABVD will be the way to go. Complication from this chemotherapy includes but not limited to nausea, vomiting, myelosuppression, neutropenia, neutropenic sepsis and possible need for growth factor support. Rarely that will result in hospitalization and severe illness and rarely death. I've also eluded and explained cardiac and pulmonary complications associated with Adriamycin and bleomycin respectively. I have emphasized to him the importance of obtaining baseline cardiac and pulmonary function in anticipation of these drugs. I also explained to him complications that is delayed such as infertility and secondary cancers and leukemias. After further discussion today he is more agreeable to get treated and agreeable to undergo the staging workup.  I have arranged for him to have a PET/CT scan, an echocardiogram and pulmonary function tests before he leaves today. I will also ask interventional radiology to  potentially perform a bone marrow biopsy and a Port-A-Cath insertion on the same day.  I'll set him up with chemotherapy education class upon the start of his chemotherapy.  2. Anemia: This is undoubtedly related to his Hodgkin's disease but the bone marrow biopsy will determine that as well. He has an element of iron deficiency which she takes iron replacement at this time.  3. IV access: Risks and benefits of a Port-A-Cath insertion were discussed complication that includes thrombosis, bleeding and infection were outlined and he is agreeable to proceed.  4. Nausea prophylaxis: A prescription for anti-emetics as well as an EMLA cream will be given prior to his chemotherapy.  5. HIV: He is on Atripla and followed by ID.    [facility], MD 2/3/20154:32 PM

## 2013-08-26 NOTE — Telephone Encounter (Signed)
per 2/3 pof no f/u yet. echo to [place] for pre auth - [facility] for cardiopulmonary requesting appt for PFT - s/w Tiffany in IR and per Tiffany she will contact pt w/appt for port. central will contact pt re bx and pet scan. pt/mom aware that central to call w/bx and pet - i will call w/pft - IR will call re port

## 2013-08-27 ENCOUNTER — Telehealth: Payer: Self-pay | Admitting: *Deleted

## 2013-08-27 NOTE — Telephone Encounter (Signed)
Patient's mother germaine donaldson calling to ask if dr Alen Blew would write a note for medicaid transportation, attn: mrs blue, that patient must have assistance from his mother when being transported, she must ride with him. Signed Note faxed, [phone]

## 2013-08-28 ENCOUNTER — Telehealth: Payer: Self-pay | Admitting: Oncology

## 2013-08-28 ENCOUNTER — Ambulatory Visit (INDEPENDENT_AMBULATORY_CARE_PROVIDER_SITE_OTHER): Payer: Medicare Other | Admitting: Internal Medicine

## 2013-08-28 ENCOUNTER — Encounter: Payer: Self-pay | Admitting: Internal Medicine

## 2013-08-28 VITALS — BP 107/73 | HR 106 | Temp 97.5°F | Wt 126.0 lb

## 2013-08-28 DIAGNOSIS — I498 Other specified cardiac arrhythmias: Secondary | ICD-10-CM | POA: Diagnosis not present

## 2013-08-28 DIAGNOSIS — C8190 Hodgkin lymphoma, unspecified, unspecified site: Secondary | ICD-10-CM | POA: Diagnosis not present

## 2013-08-28 DIAGNOSIS — B20 Human immunodeficiency virus [HIV] disease: Secondary | ICD-10-CM | POA: Diagnosis not present

## 2013-08-28 DIAGNOSIS — D638 Anemia in other chronic diseases classified elsewhere: Secondary | ICD-10-CM | POA: Diagnosis not present

## 2013-08-28 DIAGNOSIS — Z21 Asymptomatic human immunodeficiency virus [HIV] infection status: Secondary | ICD-10-CM | POA: Diagnosis not present

## 2013-08-28 DIAGNOSIS — R64 Cachexia: Secondary | ICD-10-CM | POA: Diagnosis not present

## 2013-08-28 DIAGNOSIS — R634 Abnormal weight loss: Secondary | ICD-10-CM | POA: Diagnosis not present

## 2013-08-28 DIAGNOSIS — D649 Anemia, unspecified: Secondary | ICD-10-CM | POA: Diagnosis not present

## 2013-08-28 NOTE — Telephone Encounter (Signed)
Pt called and r/s appt from pm to am lab and MD on march 2015

## 2013-08-28 NOTE — Assessment & Plan Note (Signed)
I reinforced with him and his mother the need to follow through and they are aware of the importance of the treatment.

## 2013-08-28 NOTE — Assessment & Plan Note (Signed)
I will recheck his viral load since it was up over 200 last time.  He endorses compliance.  If ok, rtc in 2 months.

## 2013-08-28 NOTE — Addendum Note (Signed)
Addendum created 08/28/13 1418 by Kate Sable, MD   Modules edited: Anesthesia Responsible Staff

## 2013-08-28 NOTE — Progress Notes (Signed)
   Subjective:    Patient ID: Thomas [name], male    DOB: 1980/10/03, 33 y.o.   MRN: [id number]  HPI Here for follow up of HIV.  Events of last few months include hospitalization, diagnosis of lymphoma.  Patient followed by Dr. Doylene Canard and to get staging PET, bone marrow, port-a-cath.  Here with his mother who is here with him in town to take care of him.  Some weight gain.  Still very weak and does not do much activity.  No fever.     Review of Systems  Constitutional: Positive for activity change and fatigue. Negative for fever.  Respiratory: Negative for cough and shortness of breath.   Cardiovascular: Negative for chest pain.  Gastrointestinal:       Some loose stool and has had occasional episodes of incontinence.   Musculoskeletal: Negative for joint swelling.  Skin: Negative for rash.       Objective:   Physical Exam  Constitutional:  Thin, chronically ill appearing  Eyes: Right eye exhibits no discharge. Left eye exhibits no discharge. No scleral icterus.  Cardiovascular: Normal rate, regular rhythm and normal heart sounds.   No murmur heard. Pulmonary/Chest: Effort normal and breath sounds normal. No respiratory distress. He has no wheezes.  Lymphadenopathy:    He has no cervical adenopathy.  Skin: No rash noted.          Assessment & Plan:

## 2013-08-29 ENCOUNTER — Telehealth: Payer: Self-pay | Admitting: Oncology

## 2013-08-29 ENCOUNTER — Other Ambulatory Visit: Payer: Self-pay | Admitting: Radiology

## 2013-08-29 ENCOUNTER — Encounter (HOSPITAL_COMMUNITY): Payer: Self-pay | Admitting: Pharmacy Technician

## 2013-08-29 NOTE — Telephone Encounter (Signed)
s/w pt mom re echo/pft 2/10 9am for echo and 10am for pft. also confirmed 3/10 lb/fu appt.

## 2013-08-31 LAB — HIV-1 RNA ULTRAQUANT REFLEX TO GENTYP+
HIV 1 RNA QUANT: 179 {copies}/mL — AB (ref <20)
HIV-1 RNA Quant, Log: 2.25 {Log} — ABNORMAL HIGH (ref <1.30)

## 2013-09-01 ENCOUNTER — Ambulatory Visit (HOSPITAL_BASED_OUTPATIENT_CLINIC_OR_DEPARTMENT_OTHER): Payer: Medicare Other

## 2013-09-01 ENCOUNTER — Other Ambulatory Visit: Payer: Self-pay | Admitting: Oncology

## 2013-09-01 ENCOUNTER — Ambulatory Visit (HOSPITAL_COMMUNITY)
Admission: RE | Admit: 2013-09-01 | Discharge: 2013-09-01 | Disposition: A | Discharge status: Home / Self Care | Payer: Medicare Other | Source: Ambulatory Visit | Attending: Oncology | Admitting: Oncology

## 2013-09-01 ENCOUNTER — Ambulatory Visit: Payer: Medicare Other | Admitting: Nutrition

## 2013-09-01 ENCOUNTER — Other Ambulatory Visit: Payer: Self-pay | Admitting: Medical Oncology

## 2013-09-01 ENCOUNTER — Telehealth: Payer: Self-pay | Admitting: Oncology

## 2013-09-01 VITALS — BP 101/68 | HR 83 | Temp 96.9°F | Resp 18

## 2013-09-01 DIAGNOSIS — D72819 Decreased white blood cell count, unspecified: Secondary | ICD-10-CM | POA: Diagnosis not present

## 2013-09-01 DIAGNOSIS — D649 Anemia, unspecified: Secondary | ICD-10-CM | POA: Insufficient documentation

## 2013-09-01 DIAGNOSIS — Z21 Asymptomatic human immunodeficiency virus [HIV] infection status: Secondary | ICD-10-CM | POA: Insufficient documentation

## 2013-09-01 DIAGNOSIS — R634 Abnormal weight loss: Secondary | ICD-10-CM | POA: Diagnosis not present

## 2013-09-01 DIAGNOSIS — D696 Thrombocytopenia, unspecified: Secondary | ICD-10-CM | POA: Diagnosis not present

## 2013-09-01 DIAGNOSIS — C8190 Hodgkin lymphoma, unspecified, unspecified site: Secondary | ICD-10-CM

## 2013-09-01 DIAGNOSIS — D539 Nutritional anemia, unspecified: Secondary | ICD-10-CM

## 2013-09-01 DIAGNOSIS — F17200 Nicotine dependence, unspecified, uncomplicated: Secondary | ICD-10-CM | POA: Insufficient documentation

## 2013-09-01 DIAGNOSIS — L738 Other specified follicular disorders: Secondary | ICD-10-CM | POA: Diagnosis not present

## 2013-09-01 DIAGNOSIS — B20 Human immunodeficiency virus [HIV] disease: Secondary | ICD-10-CM | POA: Insufficient documentation

## 2013-09-01 DIAGNOSIS — R599 Enlarged lymph nodes, unspecified: Secondary | ICD-10-CM | POA: Diagnosis not present

## 2013-09-01 DIAGNOSIS — N201 Calculus of ureter: Secondary | ICD-10-CM | POA: Diagnosis not present

## 2013-09-01 DIAGNOSIS — C8589 Other specified types of non-Hodgkin lymphoma, extranodal and solid organ sites: Secondary | ICD-10-CM | POA: Diagnosis not present

## 2013-09-01 DIAGNOSIS — Z79899 Other long term (current) drug therapy: Secondary | ICD-10-CM | POA: Insufficient documentation

## 2013-09-01 LAB — CBC
HEMATOCRIT: 17.5 % — AB (ref 39.0–52.0)
HEMATOCRIT: 17.1 % — AB (ref 39.0–52.0)
HEMOGLOBIN: 5.8 g/dL — AB (ref 13.0–17.0)
HEMOGLOBIN: 5.5 g/dL — AB (ref 13.0–17.0)
MCH: 29.4 pg (ref 26.0–34.0)
MCH: 28.8 pg (ref 26.0–34.0)
MCHC: 32.2 g/dL (ref 30.0–36.0)
MCHC: 33.1 g/dL (ref 30.0–36.0)
MCV: 88.8 fL (ref 78.0–100.0)
MCV: 89.5 fL (ref 78.0–100.0)
Platelets: 122 10*3/uL — ABNORMAL LOW (ref 150–400)
Platelets: 120 10*3/uL — ABNORMAL LOW (ref 150–400)
RBC: 1.97 MIL/uL — AB (ref 4.22–5.81)
RBC: 1.91 MIL/uL — AB (ref 4.22–5.81)
RDW: 20.2 % — ABNORMAL HIGH (ref 11.5–15.5)
RDW: 20.3 % — ABNORMAL HIGH (ref 11.5–15.5)
WBC: 5.5 10*3/uL (ref 4.0–10.5)
WBC: 6.1 10*3/uL (ref 4.0–10.5)

## 2013-09-01 LAB — BONE MARROW EXAM

## 2013-09-01 LAB — APTT: APTT: 47 s — AB (ref 24–37)

## 2013-09-01 LAB — PROTIME-INR
INR: 1.43 (ref 0.00–1.49)
Prothrombin Time: 17.1 seconds — ABNORMAL HIGH (ref 11.6–15.2)

## 2013-09-01 LAB — ABO/RH: ABO/RH(D): O POS

## 2013-09-01 MED ORDER — LIDOCAINE-EPINEPHRINE (PF) 2 %-1:200000 IJ SOLN
INTRAMUSCULAR | Status: AC
  Filled 2013-09-01: qty 20

## 2013-09-01 MED ORDER — HEPARIN SOD (PORK) LOCK FLUSH 100 UNIT/ML IV SOLN
500.0000 [IU] | Freq: Once | INTRAVENOUS | Status: AC
  Administered 2013-09-01: 500 [IU] via INTRAVENOUS
  Filled 2013-09-01: qty 5

## 2013-09-01 MED ORDER — SODIUM CHLORIDE 0.9 % IJ SOLN
10.0000 mL | INTRAMUSCULAR | Status: DC | PRN
  Administered 2013-09-01: 10 mL via INTRAVENOUS
  Filled 2013-09-01: qty 10

## 2013-09-01 MED ORDER — FENTANYL CITRATE 0.05 MG/ML IJ SOLN
INTRAMUSCULAR | Status: DC
Start: 2013-09-01 — End: 2013-09-02
  Filled 2013-09-01: qty 6

## 2013-09-01 MED ORDER — MIDAZOLAM HCL 2 MG/2ML IJ SOLN
INTRAMUSCULAR | Status: AC | PRN
  Administered 2013-09-01 (×4): 0.5 mg via INTRAVENOUS

## 2013-09-01 MED ORDER — MIDAZOLAM HCL 2 MG/2ML IJ SOLN
INTRAMUSCULAR | Status: AC
  Filled 2013-09-01: qty 6

## 2013-09-01 MED ORDER — DIPHENHYDRAMINE HCL 25 MG PO CAPS
25.0000 mg | ORAL_CAPSULE | Freq: Once | ORAL | Status: AC
  Administered 2013-09-01: 25 mg via ORAL

## 2013-09-01 MED ORDER — ACETAMINOPHEN 325 MG PO TABS
650.0000 mg | ORAL_TABLET | Freq: Once | ORAL | Status: AC
  Administered 2013-09-01: 650 mg via ORAL

## 2013-09-01 MED ORDER — LIDOCAINE HCL 1 % IJ SOLN
INTRAMUSCULAR | Status: AC
  Filled 2013-09-01: qty 20

## 2013-09-01 MED ORDER — SODIUM CHLORIDE 0.9 % IV SOLN
250.0000 mL | Freq: Once | INTRAVENOUS | Status: AC
  Administered 2013-09-01: 250 mL via INTRAVENOUS

## 2013-09-01 MED ORDER — CEFAZOLIN SODIUM-DEXTROSE 2-3 GM-% IV SOLR
2.0000 g | Freq: Once | INTRAVENOUS | Status: AC
  Administered 2013-09-01: 2 g via INTRAVENOUS
  Filled 2013-09-01: qty 50

## 2013-09-01 MED ORDER — SODIUM CHLORIDE 0.9 % IV SOLN
INTRAVENOUS | Status: AC | PRN
  Administered 2013-09-01 (×2): 500 mL via INTRAVENOUS

## 2013-09-01 MED ORDER — SODIUM CHLORIDE 0.9 % IV SOLN
Freq: Once | INTRAVENOUS | Status: AC
  Administered 2013-09-01: 08:00:00 via INTRAVENOUS

## 2013-09-01 MED ORDER — DIPHENHYDRAMINE HCL 25 MG PO CAPS
ORAL_CAPSULE | ORAL | Status: AC
  Filled 2013-09-01: qty 1

## 2013-09-01 MED ORDER — ACETAMINOPHEN 325 MG PO TABS
ORAL_TABLET | ORAL | Status: AC
  Filled 2013-09-01: qty 2

## 2013-09-01 NOTE — Procedures (Signed)
Interventional Radiology Procedure Note  Procedure: Placement of a right IJ approach single lumen PowerPort.  Tip is positioned at the superior cavoatrial junction and catheter is ready for immediate use.  Complications: No immediate Recommendations:  - Line left accessed.  Pt to [place] for blood transfusion.  - Ok to shower tomorrow - Do not submerge for 7 days - Routine line care   Signed,  Criselda Peaches, MD [facility]

## 2013-09-01 NOTE — Progress Notes (Signed)
Call from Dr. Beryle Beams, on call MD, regarding patients hgb results. Patient currently in IR for [facility] placement. Reviewed with Dr. Alen Blew, patient to be scheduled for blood transfusion next available opening in transfusion room. Per Karena Addison at short stay they will type and cross patient.   Type and Cross orders placed.

## 2013-09-01 NOTE — Progress Notes (Signed)
Notified Dr. Laurence Ferrari of HGB of 5.8 order to redraw lab.

## 2013-09-01 NOTE — Telephone Encounter (Signed)
lvm for pt regarding to tx today.

## 2013-09-01 NOTE — Discharge Instructions (Signed)
Bone Marrow Aspiration, Bone Marrow Biopsy Care After  May remove dressing in 24 hours. Tylenol if needed for pain. Call [facility] for bleeding or pain not relieved by medication.  Follow instructions from [place] regarding care of your [place].   Read the instructions outlined below and refer to this sheet in the next few weeks. These discharge instructions provide you with general information on caring for yourself after you leave the hospital. Your caregiver may also give you specific instructions. While your treatment has been planned according to the most current medical practices available, unavoidable complications occasionally occur. If you have any problems or questions after discharge, call your caregiver. FINDING OUT THE RESULTS OF YOUR TEST Not all test results are available during your visit. If your test results are not back during the visit, make an appointment with your caregiver to find out the results. Do not assume everything is normal if you have not heard from your caregiver or the medical facility. It is important for you to follow up on all of your test results.  HOME CARE INSTRUCTIONS  You have had sedation and may be sleepy or dizzy. Your thinking may not be as clear as usual. For the next 24 hours:  Only take over-the-counter or prescription medicines for pain, discomfort, and or fever as directed by your caregiver.  Do not drink alcohol.  Do not smoke.  Do not drive.  Do not make important legal decisions.  Do not operate heavy machinery.  Do not care for small children by yourself.  Keep your dressing clean and dry. You may replace dressing with a bandage after 24 hours.  You may take a bath or shower after 24 hours.  Use an ice pack for 20 minutes every 2 hours while awake for pain as needed. SEEK MEDICAL CARE IF:   There is redness, swelling, or increasing pain at the biopsy site.  There is pus coming from the biopsy site.  There is  drainage from a biopsy site lasting longer than one day.  An unexplained oral temperature above 102 F (38.9 C) develops. SEEK IMMEDIATE MEDICAL CARE IF:   You develop a rash.  You have difficulty breathing.  You develop any reaction or side effects to medications given. Document Released: 01/27/2005 Document Revised: 10/02/2011 Document Reviewed: 07/07/2008 [facility] Patient Information 2014 [place].

## 2013-09-01 NOTE — Discharge Instructions (Signed)
Implanted Port Home Guide °An implanted port is a type of central line that is placed under the skin. Central lines are used to provide IV access when treatment or nutrition needs to be given through a person's veins. Implanted ports are used for long-term IV access. An implanted port may be placed because:  °· You need IV medicine that would be irritating to the small veins in your hands or arms.   °· You need long-term IV medicines, such as antibiotics.   °· You need IV nutrition for a long period.   °· You need frequent blood draws for lab tests.   °· You need dialysis.   °Implanted ports are usually placed in the chest area, but they can also be placed in the upper arm, the abdomen, or the leg. An implanted port has two main parts:  °· Reservoir. The reservoir is round and will appear as a small, raised area under your skin. The reservoir is the part where a needle is inserted to give medicines or draw blood.   °· Catheter. The catheter is a thin, flexible tube that extends from the reservoir. The catheter is placed into a large vein. Medicine that is inserted into the reservoir goes into the catheter and then into the vein.   °HOW WILL I CARE FOR MY INCISION SITE? °Do not get the incision site wet. Bathe or shower as directed by your health care provider.  °HOW IS MY PORT ACCESSED? °Special steps must be taken to access the port:  °· Before the port is accessed, a numbing cream can be placed on the skin. This helps numb the skin over the port site.   °· Your health care provider uses a sterile technique to access the port. °· Your health care provider must put on a mask and sterile gloves. °· The skin over your port is cleaned carefully with an antiseptic and allowed to dry. °· The port is gently pinched between sterile gloves, and a needle is inserted into the port. °· Only "non-coring" port needles should be used to access the port. Once the port is accessed, a blood return should be checked. This helps  ensure that the port is in the vein and is not clogged.   °· If your port needs to remain accessed for a constant infusion, a clear (transparent) bandage will be placed over the needle site. The bandage and needle will need to be changed every week, or as directed by your health care provider.   °· Keep the bandage covering the needle clean and dry. Do not get it wet. Follow your health care provider's instructions on how to take a shower or bath while the port is accessed.   °· If your port does not need to stay accessed, no bandage is needed over the port.   °WHAT IS FLUSHING? °Flushing helps keep the port from getting clogged. Follow your health care provider's instructions on how and when to flush the port. Ports are usually flushed with saline solution or a medicine called heparin. The need for flushing will depend on how the port is used.  °· If the port is used for intermittent medicines or blood draws, the port will need to be flushed:   °· After medicines have been given.   °· After blood has been drawn.   °· As part of routine maintenance.   °· If a constant infusion is running, the port may not need to be flushed.   °HOW LONG WILL MY PORT STAY IMPLANTED? °The port can stay in for as long as your health care   provider thinks it is needed. When it is time for the port to come out, surgery will be done to remove it. The procedure is similar to the one performed when the port was put in.  °WHEN SHOULD I SEEK IMMEDIATE MEDICAL CARE? °When you have an implanted port, you should seek immediate medical care if:  °· You notice a bad smell coming from the incision site.   °· You have swelling, redness, or drainage at the incision site.   °· You have more swelling or pain at the port site or the surrounding area.   °· You have a fever that is not controlled with medicine. °Document Released: 07/10/2005 Document Revised: 04/30/2013 Document Reviewed: 03/17/2013 °ExitCare® Patient Information ©2014 [employer]  [employer]. °Moderate Sedation, Adult °Moderate sedation is given to help you relax or even sleep through a procedure. You may remain sleepy, be clumsy, or have poor balance for several hours following this procedure. Arrange for a responsible adult, family member, or friend to take you home. A responsible adult should stay with you for at least 24 hours or until the medicines have worn off. °· Do not participate in any activities where you could become injured for the next 24 hours, or until you feel normal again. Do not: °· Drive. °· Swim. °· Ride a bicycle. °· Operate heavy machinery. °· Cook. °· Use power tools. °· Climb ladders. °· Work at heights. °· Do not make important decisions or sign legal documents until you are improved. °· Vomiting may occur if you eat too soon. When you can drink without vomiting, try water, juice, or soup. Try solid foods if you feel little or no nausea. °· Only take over-the-counter or prescription medications for pain, discomfort, or fever as directed by your caregiver.If pain medications have been prescribed for you, ask your caregiver how soon it is safe to take them. °· Make sure you and your family fully understands everything about the medication given to you. Make sure you understand what side effects may occur. °· You should not drink alcohol, take sleeping pills, or medications that cause drowsiness for at least 24 hours. °· If you smoke, do not smoke alone. °· If you are feeling better, you may resume normal activities 24 hours after receiving sedation. °· Keep all appointments as scheduled. Follow all instructions. °· Ask questions if you do not understand. °SEEK MEDICAL CARE IF:  °· Your skin is pale or bluish in color. °· You continue to feel sick to your stomach (nauseous) or throw up (vomit). °· Your pain is getting worse and not helped by medication. °· You have bleeding or swelling. °· You are still sleepy or feeling clumsy after 24 hours. °SEEK IMMEDIATE MEDICAL CARE IF:   °· You develop a rash. °· You have difficulty breathing. °· You develop any type of allergic problem. °· You have a fever. °Document Released: 04/04/2001 Document Revised: 10/02/2011 Document Reviewed: 03/17/2013 °ExitCare® Patient Information ©2014 [employer]. °Bone Marrow Aspiration, Bone Marrow Biopsy °Care After °Read the instructions outlined below and refer to this sheet in the next few weeks. These discharge instructions provide you with general information on caring for yourself after you leave the hospital. Your caregiver may also give you specific instructions. While your treatment has been planned according to the most current medical practices available, unavoidable complications occasionally occur. If you have any problems or questions after discharge, call your caregiver. °FINDING OUT THE RESULTS OF YOUR TEST °Not all test results are available during your visit. If   your test results are not back during the visit, make an appointment with your caregiver to find out the results. Do not assume everything is normal if you have not heard from your caregiver or the medical facility. It is important for you to follow up on all of your test results.  °HOME CARE INSTRUCTIONS  °You have had sedation and may be sleepy or dizzy. Your thinking may not be as clear as usual. For the next 24 hours: °· Only take over-the-counter or prescription medicines for pain, discomfort, and or fever as directed by your caregiver. °· Do not drink alcohol. °· Do not smoke. °· Do not drive. °· Do not make important legal decisions. °· Do not operate heavy machinery. °· Do not care for small children by yourself. °· Keep your dressing clean and dry. You may replace dressing with a bandage after 24 hours. °· You may take a bath or shower after 24 hours. °· Use an ice pack for 20 minutes every 2 hours while awake for pain as needed. °SEEK MEDICAL CARE IF:  °· There is redness, swelling, or increasing pain at the biopsy  site. °· There is pus coming from the biopsy site. °· There is drainage from a biopsy site lasting longer than one day. °· An unexplained oral temperature above 102° F (38.9° C) develops. °SEEK IMMEDIATE MEDICAL CARE IF:  °· You develop a rash. °· You have difficulty breathing. °· You develop any reaction or side effects to medications given. °Document Released: 01/27/2005 Document Revised: 10/02/2011 Document Reviewed: 07/07/2008 °ExitCare® Patient Information ©2014 [employer]. ° °

## 2013-09-01 NOTE — Procedures (Signed)
Interventional Radiology Procedure Note  Procedure: CT guided aspirate and core biopsy of right iliac bone using On-Control drill.  Complications: None Recommendations: - Bedrest supine x 2 hrs - Pt to IR for portacatheter placement - Hydrocodone PRN  Pain - Follow biopsy results  Signed,  Criselda Peaches, MD [facility]

## 2013-09-01 NOTE — Patient Instructions (Signed)
Blood Transfusion  A blood transfusion replaces your blood or some of its parts. Blood is replaced when you have lost blood because of surgery, an accident, or for severe blood conditions like anemia. You can donate blood to be used on yourself if you have a planned surgery. If you lose blood during that surgery, your own blood can be given back to you. Any blood given to you is checked to make sure it matches your blood type. Your temperature, blood pressure, and heart rate (vital signs) will be checked often.  GET HELP RIGHT AWAY IF:   You feel sick to your stomach (nauseous) or throw up (vomit).  You have watery poop (diarrhea).  You have shortness of breath or trouble breathing.  You have blood in your pee (urine) or have dark colored pee.  You have chest pain or tightness.  Your eyes or skin turn yellow (jaundice).  You have a temperature by mouth above 102 F (38.9 C), not controlled by medicine.  You start to shake and have chills.  You develop a a red rash (hives) or feel itchy.  You develop lightheadedness or feel confused.  You develop back, joint, or muscle pain.  You do not feel hungry (lost appetite).  You feel tired, restless, or nervous.  You develop belly (abdominal) cramps. Document Released: 10/06/2008 Document Revised: 10/02/2011 Document Reviewed: 10/06/2008 Exit[employer] Patient Information 2014 [employer].  

## 2013-09-01 NOTE — H&P (Signed)
Thomas [name] is an 33 y.o. male.   Chief Complaint: "I'm here for a bone marrow biopsy and port a cath" HPI: Patient with HIV and recently diagnosed Hodgkin's disease presents today for staging CT guided bone marrow biopsy and port a cath placement for chemotherapy.  Past Medical History  Diagnosis Date  . HIV disease 06/25/2012  . Skin lesion 07/03/2012  . Leg pain 07/03/2012  . Sinus tachycardia 08/06/2012  . Penile cyst 08/06/2012    Past Surgical History  Procedure Laterality Date  . 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  this after several previous I and Ds of rectal abcess.   [name] node biopsy Right 08/08/2013    Procedure: SUPRACLAVICULAR LYMPH NODE BIOPSY;  Surgeon: Gaye Pollack, MD;  Location: [facility];  Service: Thoracic;  Laterality: Right;    No family history on file. Social History:  reports that he has been smoking Cigarettes.  He has a 15 pack-year smoking history. He has never used smokeless tobacco. He reports that he does not drink alcohol or use illicit drugs.  Allergies:  Allergies  Allergen Reactions  . Tylenol [Acetaminophen]     sweats    Current outpatient prescriptions:atenolol (TENORMIN) 25 MG tablet, Take 25 mg by mouth every morning., Disp: , Rfl: ;  efavirenz-emtricitabine-tenofovir (ATRIPLA) [phone] MG per tablet, Take 1 tablet by mouth at bedtime., Disp: , Rfl: ;  ferrous sulfate 325 (65 FE) MG tablet, Take 1 tablet (325 mg total) by mouth 2 (two) times daily with a meal., Disp: 60 tablet, Rfl: 3 pantoprazole (PROTONIX) 40 MG tablet, Take 1 tablet (40 mg total) by mouth 2 (two) times daily before a meal., Disp: 30 tablet, Rfl: 0 Current facility-administered medications:ceFAZolin (ANCEF) IVPB 2 g/50 mL premix, 2 g, Intravenous, Once, [employer], PA-C;  fentaNYL (SUBLIMAZE) 0.05 MG/ML injection, , , , ;  midazolam (VERSED) 2 MG/2ML injection, , , ,    Results for orders  placed during the hospital encounter of 09/01/13 (from the past 48 hour(s))  APTT     Status: Abnormal   Collection Time    09/01/13  7:45 AM      Result Value Range   aPTT 47 (*) 24 - 37 seconds   Comment:            IF BASELINE aPTT IS ELEVATED,     SUGGEST PATIENT RISK ASSESSMENT     BE USED TO DETERMINE APPROPRIATE     ANTICOAGULANT THERAPY.  CBC     Status: Abnormal   Collection Time    09/01/13  7:45 AM      Result Value Range   WBC 6.1  4.0 - 10.5 K/uL   RBC 1.97 (*) 4.22 - 5.81 MIL/uL   Hemoglobin 5.8 (*) 13.0 - 17.0 g/dL   Comment: REPEATED TO VERIFY     CRITICAL RESULT CALLED TO, READ BACK BY AND VERIFIED WITH:     B. ROBERTS RN AT 0805 ON 02.09.15 BY SHUEA   HCT 17.5 (*) 39.0 - 52.0 %   MCV 88.8  78.0 - 100.0 fL   MCH 29.4  26.0 - 34.0 pg   MCHC 33.1  30.0 - 36.0 g/dL   RDW 20.2 (*) 11.5 - 15.5 %   Platelets 122 (*) 150 - 400 K/uL  PROTIME-INR     Status: Abnormal   Collection Time    09/01/13  7:45 AM  Result Value Range   Prothrombin Time 17.1 (*) 11.6 - 15.2 seconds   INR 1.43  0.00 - 1.49  CBC     Status: Abnormal   Collection Time    09/01/13  8:11 AM      Result Value Range   WBC 5.5  4.0 - 10.5 K/uL   RBC 1.91 (*) 4.22 - 5.81 MIL/uL   Hemoglobin 5.5 (*) 13.0 - 17.0 g/dL   Comment: REPEATED TO VERIFY     CRITICAL VALUE NOTED.  VALUE IS CONSISTENT WITH PREVIOUSLY REPORTED AND CALLED VALUE.     RESULTS VERIFIED VIA RECOLLECT   HCT 17.1 (*) 39.0 - 52.0 %   MCV 89.5  78.0 - 100.0 fL   MCH 28.8  26.0 - 34.0 pg   MCHC 32.2  30.0 - 36.0 g/dL   RDW 20.3 (*) 11.5 - 15.5 %   Platelets 120 (*) 150 - 400 K/uL   Comment: SPECIMEN CHECKED FOR CLOTS     REPEATED TO VERIFY   No results found.  Review of Systems  Constitutional: Positive for weight loss and malaise/fatigue. Negative for fever and chills.  Respiratory: Positive for shortness of breath. Negative for cough and hemoptysis.   Cardiovascular: Negative for chest pain.  Gastrointestinal: Negative  for nausea, vomiting and abdominal pain.  Musculoskeletal: Negative for back pain.  Neurological: Negative for headaches.  Endo/Heme/Allergies: Does not bruise/bleed easily.    Blood pressure 104/65, pulse 114, temperature 99.2 F (37.3 C), temperature source Oral, resp. rate 16, weight 119 lb 8 oz (54.205 kg), SpO2 100.00%. Physical Exam  Constitutional: He is oriented to person, place, and time.  Thin frail BM in NAD  Cardiovascular:  Tachy but regular rhythm  Respiratory: Effort normal and breath sounds normal.  GI: Soft. Bowel sounds are normal. There is no tenderness.  Musculoskeletal: He exhibits no edema.  Neurological: He is alert and oriented to person, place, and time.     Assessment/Plan Patient with HIV and recently diagnosed Hodgkin's disease presents today for staging CT guided bone marrow biopsy and port a cath placement for chemotherapy. Details/risks of procedures d/w pt/mother with their understanding and consent. Pt noted to have hgb of 5.5 today. Case d/w oncology service and pt will undergo transfusion at cancer center today following port a cath placement.  Thomas [name],D KEVIN 09/01/2013, 9:04 AM

## 2013-09-01 NOTE — Progress Notes (Signed)
Patient's BP running consistently low since arrival from IR. Patient asymptomatic. Dr. Alen Blew paged - has not returned page. Rubin Payor, Dr. Hazeline Junker nurse notified and stated she will pass information along to Dr. Alen Blew when he is back from lunch. Dr. Beryle Beams (MD on call) paged and notified of low BP. Per Dr. Beryle Beams, give 2 units of blood. No new orders given since patient is asymptomatic. Will continue to monitor closely. Dr. Beryle Beams came to see patient in fusion room. Okay to run blood at 278mL/hr per Dr. Darnell Level. Upon discharge, BP stable at 101/68. Cindi Carbon, RN

## 2013-09-01 NOTE — Progress Notes (Signed)
Report to Tonya at cancer center patient to go there at 12:30 for blood.

## 2013-09-01 NOTE — Progress Notes (Signed)
Nurse requesting I visit with patient today.  Patient is a 32 year old male diagnosed with Hodgkin's lymphoma.  Past medical history includes HIV, skin lesions, and severe protein calorie malnutrition.  Patient reports usual body weight of 235 pounds 2 years ago.   Weight 142 pounds in January 2014.  Weight on 09/01/2013, was documented as 119.5 pounds, which is a BMI of 15.77, which is underweight.  Patient denies nausea and vomiting.  He does have loose stools 1-3 daily.  Oral intake appears inadequate based on dietary recall.  He will drink Ensure Plus and denies intolerance.  I educated patient on ways to increase calories and protein throughout the day.  I've encouraged him to add protein containing foods to his meals.  Recommended patient drink Ensure Plus or boost plus a minimum of 4 times daily.  Provided patient with fact sheets and coupons.  Questions were answered.  Teach back method used.  I will follow patient as needed.

## 2013-09-01 NOTE — H&P (Signed)
Agree with PA note.  Discussed worsening anemia with Dr. Beryle Beams. He is arranging for transfusion today.   Signed,  Criselda Peaches, MD [facility]

## 2013-09-02 ENCOUNTER — Ambulatory Visit (HOSPITAL_COMMUNITY)
Admission: RE | Admit: 2013-09-02 | Discharge: 2013-09-02 | Disposition: A | Discharge status: Home / Self Care | Payer: Medicare Other | Source: Ambulatory Visit | Attending: Oncology | Admitting: Oncology

## 2013-09-02 DIAGNOSIS — F17200 Nicotine dependence, unspecified, uncomplicated: Secondary | ICD-10-CM | POA: Insufficient documentation

## 2013-09-02 DIAGNOSIS — R0609 Other forms of dyspnea: Secondary | ICD-10-CM | POA: Diagnosis not present

## 2013-09-02 DIAGNOSIS — Z01818 Encounter for other preprocedural examination: Secondary | ICD-10-CM | POA: Insufficient documentation

## 2013-09-02 DIAGNOSIS — R0989 Other specified symptoms and signs involving the circulatory and respiratory systems: Secondary | ICD-10-CM | POA: Insufficient documentation

## 2013-09-02 DIAGNOSIS — Z5111 Encounter for antineoplastic chemotherapy: Secondary | ICD-10-CM | POA: Diagnosis not present

## 2013-09-02 DIAGNOSIS — C8190 Hodgkin lymphoma, unspecified, unspecified site: Secondary | ICD-10-CM

## 2013-09-02 LAB — TYPE AND SCREEN
ABO/RH(D): O POS
ANTIBODY SCREEN: NEGATIVE
UNIT DIVISION: 0
Unit division: 0

## 2013-09-02 NOTE — Progress Notes (Addendum)
  Echocardiogram 2D Echocardiogram limited has been performed.  Thomas [name] 09/02/2013, 9:50 AM

## 2013-09-09 LAB — CHROMOSOME ANALYSIS, BONE MARROW

## 2013-09-10 ENCOUNTER — Encounter (HOSPITAL_COMMUNITY): Payer: Medicare Other

## 2013-09-12 NOTE — Progress Notes (Unsigned)
Patient ID: Thomas [name], male   DOB: 1981/06/09, 33 y.o.   MRN: [id number]

## 2013-09-15 LAB — AFB CULTURE, BLOOD

## 2013-09-17 ENCOUNTER — Telehealth: Payer: Self-pay | Admitting: *Deleted

## 2013-09-17 NOTE — Telephone Encounter (Signed)
Called and spoke with Mrs. Thomas [name]' mother.  She told me his co-pay for his Atripla is $3.60 per month.  I told her we did not have a patient assistance program that would take care of that amount.  I told her to call his case worker to see if he qualifies for the Cap program.

## 2013-09-19 ENCOUNTER — Encounter (HOSPITAL_COMMUNITY): Payer: Self-pay

## 2013-09-19 ENCOUNTER — Ambulatory Visit (HOSPITAL_COMMUNITY)
Admission: RE | Admit: 2013-09-19 | Discharge: 2013-09-19 | Disposition: A | Discharge status: Home / Self Care | Payer: Medicare Other | Source: Ambulatory Visit | Attending: Oncology | Admitting: Oncology

## 2013-09-19 DIAGNOSIS — R599 Enlarged lymph nodes, unspecified: Secondary | ICD-10-CM | POA: Insufficient documentation

## 2013-09-19 DIAGNOSIS — N1330 Unspecified hydronephrosis: Secondary | ICD-10-CM | POA: Insufficient documentation

## 2013-09-19 DIAGNOSIS — C8190 Hodgkin lymphoma, unspecified, unspecified site: Secondary | ICD-10-CM | POA: Diagnosis not present

## 2013-09-19 DIAGNOSIS — N201 Calculus of ureter: Secondary | ICD-10-CM | POA: Insufficient documentation

## 2013-09-19 DIAGNOSIS — N200 Calculus of kidney: Secondary | ICD-10-CM | POA: Diagnosis not present

## 2013-09-19 DIAGNOSIS — R161 Splenomegaly, not elsewhere classified: Secondary | ICD-10-CM | POA: Insufficient documentation

## 2013-09-19 LAB — GLUCOSE, CAPILLARY: Glucose-Capillary: 95 mg/dL (ref 70–99)

## 2013-09-19 MED ORDER — FLUDEOXYGLUCOSE F - 18 (FDG) INJECTION
6.3000 | Freq: Once | INTRAVENOUS | Status: AC | PRN
  Administered 2013-09-19: 6.3 via INTRAVENOUS

## 2013-09-21 HISTORY — PX: BONE MARROW TRANSPLANT: SHX200

## 2013-09-23 ENCOUNTER — Telehealth: Payer: Self-pay

## 2013-09-23 NOTE — Telephone Encounter (Signed)
Patient's Mother has several questions. She did not want to share any information with me. She wanted to speak with Dr Linus Salmons directly.  She asked for results of scans ordered by [facility] which I was not able to share. I advised her to call the [facility] and she stated she had already called and it seems she was not able to get any helpful information.  I asked her to share her concerns so I could speak with the doctor and she refused.  I explained the normal procedure for triage messages that I should screen the calls and then share the information with the physician and do my best to make sure her concerns are heard.  She still refused. She feels there is no way to speak with a doctor by phone and will wait until his next office visit to discuss situation.   I will forward this message to Dr Linus Salmons.   Laverle Patter, RN

## 2013-09-25 ENCOUNTER — Telehealth: Payer: Self-pay | Admitting: Medical Oncology

## 2013-09-25 NOTE — Telephone Encounter (Signed)
Patient's mother called requesting to know when patient will be starting chemotherapy. Reviewed with MD, md to discuss schedule of treatments at upcoming app on 03/10. Mother expressed thanks, no further questions at this time.

## 2013-09-30 ENCOUNTER — Ambulatory Visit: Payer: Medicare Other | Admitting: Oncology

## 2013-09-30 ENCOUNTER — Ambulatory Visit (HOSPITAL_COMMUNITY)
Admission: RE | Admit: 2013-09-30 | Discharge: 2013-09-30 | Disposition: A | Discharge status: Home / Self Care | Payer: Medicare Other | Source: Ambulatory Visit | Attending: Oncology | Admitting: Oncology

## 2013-09-30 ENCOUNTER — Ambulatory Visit: Payer: Medicare Other

## 2013-09-30 ENCOUNTER — Other Ambulatory Visit: Payer: Self-pay | Admitting: *Deleted

## 2013-09-30 ENCOUNTER — Encounter: Payer: Self-pay | Admitting: Oncology

## 2013-09-30 ENCOUNTER — Telehealth: Payer: Self-pay | Admitting: Oncology

## 2013-09-30 ENCOUNTER — Other Ambulatory Visit: Payer: Medicare Other

## 2013-09-30 ENCOUNTER — Ambulatory Visit (HOSPITAL_BASED_OUTPATIENT_CLINIC_OR_DEPARTMENT_OTHER): Payer: Medicare Other | Admitting: Oncology

## 2013-09-30 ENCOUNTER — Other Ambulatory Visit (HOSPITAL_BASED_OUTPATIENT_CLINIC_OR_DEPARTMENT_OTHER): Payer: Medicare Other

## 2013-09-30 VITALS — BP 96/56 | HR 124 | Temp 97.3°F | Resp 18 | Ht 73.0 in | Wt 112.6 lb

## 2013-09-30 DIAGNOSIS — D72819 Decreased white blood cell count, unspecified: Secondary | ICD-10-CM | POA: Diagnosis not present

## 2013-09-30 DIAGNOSIS — D649 Anemia, unspecified: Secondary | ICD-10-CM

## 2013-09-30 DIAGNOSIS — D539 Nutritional anemia, unspecified: Secondary | ICD-10-CM

## 2013-09-30 DIAGNOSIS — Z95828 Presence of other vascular implants and grafts: Secondary | ICD-10-CM

## 2013-09-30 DIAGNOSIS — R5381 Other malaise: Secondary | ICD-10-CM | POA: Diagnosis not present

## 2013-09-30 DIAGNOSIS — C8190 Hodgkin lymphoma, unspecified, unspecified site: Secondary | ICD-10-CM

## 2013-09-30 DIAGNOSIS — D696 Thrombocytopenia, unspecified: Secondary | ICD-10-CM | POA: Diagnosis not present

## 2013-09-30 DIAGNOSIS — Z21 Asymptomatic human immunodeficiency virus [HIV] infection status: Secondary | ICD-10-CM | POA: Diagnosis not present

## 2013-09-30 DIAGNOSIS — B20 Human immunodeficiency virus [HIV] disease: Secondary | ICD-10-CM | POA: Diagnosis not present

## 2013-09-30 DIAGNOSIS — D6481 Anemia due to antineoplastic chemotherapy: Secondary | ICD-10-CM | POA: Diagnosis not present

## 2013-09-30 DIAGNOSIS — C8198 Hodgkin lymphoma, unspecified, lymph nodes of multiple sites: Secondary | ICD-10-CM

## 2013-09-30 DIAGNOSIS — N201 Calculus of ureter: Secondary | ICD-10-CM | POA: Diagnosis not present

## 2013-09-30 DIAGNOSIS — R634 Abnormal weight loss: Secondary | ICD-10-CM | POA: Diagnosis not present

## 2013-09-30 DIAGNOSIS — T451X5A Adverse effect of antineoplastic and immunosuppressive drugs, initial encounter: Secondary | ICD-10-CM | POA: Insufficient documentation

## 2013-09-30 DIAGNOSIS — R630 Anorexia: Secondary | ICD-10-CM

## 2013-09-30 DIAGNOSIS — L738 Other specified follicular disorders: Secondary | ICD-10-CM | POA: Diagnosis not present

## 2013-09-30 DIAGNOSIS — R599 Enlarged lymph nodes, unspecified: Secondary | ICD-10-CM | POA: Diagnosis not present

## 2013-09-30 DIAGNOSIS — R5383 Other fatigue: Secondary | ICD-10-CM

## 2013-09-30 LAB — CBC WITH DIFFERENTIAL/PLATELET
BASO%: 0.5 % (ref 0.0–2.0)
Basophils Absolute: 0 10*3/uL (ref 0.0–0.1)
EOS%: 0 % (ref 0.0–7.0)
Eosinophils Absolute: 0 10*3/uL (ref 0.0–0.5)
HCT: 14.9 % — ABNORMAL LOW (ref 38.4–49.9)
HGB: 4.4 g/dL — CL (ref 13.0–17.1)
LYMPH%: 55.3 % — ABNORMAL HIGH (ref 14.0–49.0)
MCH: 28.6 pg (ref 27.2–33.4)
MCHC: 29.5 g/dL — ABNORMAL LOW (ref 32.0–36.0)
MCV: 96.8 fL (ref 79.3–98.0)
MONO#: 0.5 10*3/uL (ref 0.1–0.9)
MONO%: 9.5 % (ref 0.0–14.0)
NEUT#: 2 10*3/uL (ref 1.5–6.5)
NEUT%: 34.7 % — ABNORMAL LOW (ref 39.0–75.0)
Platelets: 78 10*3/uL — ABNORMAL LOW (ref 140–400)
RBC: 1.54 10*6/uL — ABNORMAL LOW (ref 4.20–5.82)
RDW: 27.6 % — ABNORMAL HIGH (ref 11.0–14.6)
WBC: 5.7 10*3/uL (ref 4.0–10.3)
lymph#: 3.2 10*3/uL (ref 0.9–3.3)
nRBC: 0 % (ref 0–0)

## 2013-09-30 LAB — IRON AND TIBC CHCC
%SAT: 36 % (ref 20–55)
IRON: 36 ug/dL — AB (ref 42–163)
TIBC: 101 ug/dL — ABNORMAL LOW (ref 202–409)
UIBC: 65 ug/dL — AB (ref 117–376)

## 2013-09-30 LAB — TECHNOLOGIST REVIEW

## 2013-09-30 LAB — PREPARE RBC (CROSSMATCH)

## 2013-09-30 LAB — HOLD TUBE, BLOOD BANK

## 2013-09-30 LAB — FERRITIN CHCC: Ferritin: 4391 ng/ml — ABNORMAL HIGH (ref 22–316)

## 2013-09-30 MED ORDER — SODIUM CHLORIDE 0.9 % IJ SOLN
10.0000 mL | INTRAMUSCULAR | Status: DC | PRN
  Administered 2013-09-30: 10 mL via INTRAVENOUS
  Filled 2013-09-30: qty 10

## 2013-09-30 MED ORDER — HEPARIN SOD (PORK) LOCK FLUSH 100 UNIT/ML IV SOLN
500.0000 [IU] | Freq: Once | INTRAVENOUS | Status: AC
  Administered 2013-09-30: 500 [IU] via INTRAVENOUS
  Filled 2013-09-30: qty 5

## 2013-09-30 MED ORDER — PROCHLORPERAZINE MALEATE 10 MG PO TABS: 10.0000 mg | ORAL_TABLET | Freq: Four times a day (QID) | ORAL | Status: DC | PRN

## 2013-09-30 MED ORDER — LIDOCAINE-PRILOCAINE 2.5-2.5 % EX KIT: PACK | Freq: Once | CUTANEOUS | Status: DC

## 2013-09-30 NOTE — Patient Instructions (Signed)

## 2013-09-30 NOTE — Progress Notes (Signed)
Hematology and Oncology Follow Up Visit  Thomas [name] [id number] Oct 25, 1980 33 y.o. 09/30/2013 11:10 AM   Principle Diagnosis:  33 year old HIV positive gentleman diagnosed with Hodgkin's disease in January of 2015 after he presented with anemia and diffuse lymphadenopathy.   Prior Therapy: He is status post supraclavicular right lymph node biopsy done on 08/02/2013.  Current therapy: Systemic chemotherapy in the form of ABVD to start on 10/03/2013.  Interim History:  Mr. Prokop presents today for a followup visit accompanied by his mother. Since his his last visit, he underwent the appropriate staging workup prior to the start of his systemic chemotherapy. He is not reporting any fevers or chills or sweats. Has not reported any pruritus or any constitutional symptoms. He is no longer reporting any lymphadenopathy. Is not reporting any hematochezia or melena or any bleeding problems. He is still quite fatigued and debilitated and has very limited performance status. Has not reported any recent hospitalizations or illnesses. His appetite remains poor with about 10 pound weight loss.  Medications: I have reviewed the patient's current medications.  Current Outpatient Prescriptions  Medication Sig Dispense Refill  . atenolol (TENORMIN) 25 MG tablet Take 25 mg by mouth every morning.      Marland Kitchen efavirenz-emtricitabine-tenofovir (ATRIPLA) [id number] MG per tablet Take 1 tablet by mouth at bedtime.      . ferrous sulfate 325 (65 FE) MG tablet Take 1 tablet (325 mg total) by mouth 2 (two) times daily with a meal.  60 tablet  3  . pantoprazole (PROTONIX) 40 MG tablet Take 1 tablet (40 mg total) by mouth 2 (two) times daily before a meal.  30 tablet  0  . lidocaine-prilocaine (EMLA) cream Apply topically once.  1 each  0  . prochlorperazine (COMPAZINE) 10 MG tablet Take 1 tablet (10 mg total) by mouth every 6 (six) hours as needed for nausea or vomiting.  30 tablet  0   No current  facility-administered medications for this visit.     Allergies:  Allergies  Allergen Reactions  . Tylenol [Acetaminophen]     sweats    Past Medical History, Surgical history, Social history, and Family History were reviewed and updated.  Review of Systems: Constitutional:  Negative for fever, chills, night sweats, anorexia, weight loss, pain. Cardiovascular: negative for - chest pain, dyspnea on exertion or palpitations Respiratory: positive for - cough, hemoptysis and orthopnea Neurological: negative for - behavioral changes, dizziness or visual changes Dermatological: negative for acne, eczema and lumps ENT: negative for - headaches, sinus pain, sneezing or sore throat Skin: Negative. Gastrointestinal: negative for - abdominal pain, diarrhea or hematemesis Genito-Urinary: negative for - change in urinary stream, dysuria or hematuria Hematological and Lymphatic: negative for - bruising, jaundice or night sweats Breast: negative Musculoskeletal: negative for - joint swelling, muscle pain or muscular weakness Remaining ROS negative. Physical Exam: Blood pressure 96/56, pulse 124, temperature 97.3 F (36.3 C), temperature source Oral, resp. rate 18, height _0  (1.854 m), weight 112 lb 9.6 oz (51.075 kg), SpO2 100.00%. ECOG: 1 General appearance: alert, cooperative and appears stated age Head: Normocephalic, without obvious abnormality, atraumatic Neck: no adenopathy, no carotid bruit, no JVD, supple, symmetrical, trachea midline and thyroid not enlarged, symmetric, no tenderness/mass/nodules Lymph nodes: Cervical, supraclavicular, and axillary nodes normal. Heart:regular rate and rhythm, S1, S2 normal, no murmur, click, rub or gallop Lung:chest clear, no wheezing, rales, normal symmetric air entry Abdomin: soft, non-tender, without masses or organomegaly EXT:no erythema, induration, or nodules   Lab Results:  Lab Results  Component Value Date   WBC 5.7 09/30/2013   HGB  4.4* 09/30/2013   HCT 14.9* 09/30/2013   MCV 96.8 09/30/2013   PLT 78* 09/30/2013     Chemistry      Component Value Date/Time   NA 127* 08/26/2013 1527   NA 141 08/04/2013 0339   K 4.0 08/26/2013 1527   K 3.6* 08/04/2013 0339   CL 111 08/04/2013 0339   CO2 18* 08/26/2013 1527   CO2 20 08/04/2013 0339   BUN 16.7 08/26/2013 1527   BUN 12 08/04/2013 0339   CREATININE 0.9 08/26/2013 1527   CREATININE 0.71 08/04/2013 0339   CREATININE 0.85 06/17/2013 1658      Component Value Date/Time   CALCIUM 9.3 08/26/2013 1527   CALCIUM 7.5* 08/04/2013 0339   ALKPHOS 141 08/26/2013 1527   ALKPHOS 123* 08/02/2013 1620   AST 28 08/26/2013 1527   AST 17 08/02/2013 1620   ALT 18 08/26/2013 1527   ALT 7 08/02/2013 1620   BILITOT 0.65 08/26/2013 1527   BILITOT 0.3 08/02/2013 1620     Study Conclusions  - Left ventricle: The cavity size was normal. Wall thickness was normal. Systolic function was normal. The estimated ejection fraction was in the range of 55% to 60%. Wall motion was normal; there were no regional wall motion abnormalities. Left ventricular diastolic function parameters were normal. Logitudinal strain imaging values ranged from -14 to -20% which is normal. - Left atrium: The atrium was normal in size. - Atrial septum: No defect or patent foramen ovale was identified. - Pericardium, extracardiac: There was no pericardial effusion. Impressions:  - Normal study. Transthoracic echocardiography. M-mode, limited 2D, limited spectral Doppler, and color Doppler. Height: Height: 185.4cm. Height: 73in. Weight: Weight: 56.8kg. Weight: 125lb. Body mass index: BMI: 16.5kg/m^2. Body surface area: BSA: 1.20m^2. Blood pressure: 87/60. Patient status: Outpatient. Location: Echo laboratory.   EXAM:  NUCLEAR MEDICINE PET SKULL BASE TO THIGH  FASTING BLOOD GLUCOSE: Value: 90 mg/dl  TECHNIQUE:  6.3 mCi F-18 FDG was injected intravenously. Full-ring PET imaging  was performed from the skull base to thigh after the  radiotracer. CT  data was obtained and used for attenuation correction and anatomic  localization.  COMPARISON: CT CHEST W/CM dated 08/04/2013; CT ABD/PELVIS W CM dated  08/04/2013  FINDINGS:  NECK  Bilateral hypermetabolic level two lymph nodes with SUV max = 5.9.  CHEST  There is bulky hypermetabolic axial lymphadenopathy with SUV max  8.5. Example lymph node measures 30 mm short axis (image 52,  series).  There is bulky hypermetabolic mediastinal hilar adenopathy with SUV  max 8.2. There is no pulmonary parenchymal lesions present.  ABDOMEN/PELVIS  The spleen is massively enlarged and intensely hypermetabolic  measuring 28 cm in craniocaudad dimension with SUV max 8.5  There is hypermetabolic periportal and retrocrural adenopathy. For  example retrocrural lymph node measures 18 mm short axis (image  104).  There is bilateral renal calculi. Moderate hydronephrosis and  hydroureter on the right. There is a partially obstructing calculus  in the proximal right ureter measuring 9 mm. There several distal  right ureteral calculi measuring 8 mm (image 183), 6 mm (image 186)  and 5 mm (image 192). These distal right ureteral calculi are  approximately 2 to 4 cm from the vesicoureteral junction. No  evidence of hydronephrosis or hydroureter on the left. There is a  calculus in the right left renal pelvis measuring 1 5 mm (image  113).  There is intensely  hypermetabolic inguinal and iliac adenopathy.  Left external iliac lymph node measures 22 mm with SUV max 12 point  3.  Of note the liver activity is relatively low (SUV max 1.5).  SKELETON  There is diffuse intense hypermetabolic marrow activity throughout  the visualized skeleton with SUV max 10.7.  IMPRESSION:  1. Intense hypermetabolic adenopathy involving the neck, axilla,  mediastinum, retroperitoneum and iliac nodal stations. The activity  is significantly above liver ( Deauville 5 )  2. Hypermetabolic massively enlarged  spleen ( Deauville 5)  3. Intensely hypermetabolic diffuse marrow activity ( Deauville 5).  4. Bilateral ureteral calculi with increased obstruction on the  right. Recommend urology consultation.  Findings conveyed toFIRAS Korena Nass's nurse Dixxie on 09/19/2013  at12:21.   Impression and Plan:  33 year old gentleman with the following issues:  1. Hodgkin's disease presented with lymphadenopathy above and below the diaphragm without any constitutional symptoms indicating at least stage IIIA. His PET scan and bone marrow biopsy results were discussed with the patient and his mother. He does have disease involvement above and below the diaphragm as well as bone marrow involvement his echocardiogram as well should normal EF. He is ready to proceed with systemic chemotherapy. Complication from this chemotherapy includes but not limited to nausea, vomiting, myelosuppression, neutropenia, neutropenic sepsis and possible need for growth factor support. Rarely that will result in hospitalization and severe illness and rarely death. I've also eluded and explained cardiac and pulmonary complications associated with Adriamycin and bleomycin respectively.   Chemotherapy education class will be set up this week prior to the start of systemic chemotherapy. I anticipate he'll require 4-6 cycles of treatment at this time.    2. Anemia: This is undoubtedly related to his Hodgkin's disease he'll receive 2 units of packed red cell transfusion today.  3. IV access: Port-A-Cath has already been inserted and EMLA cream will be called in today.  4. Nausea prophylaxis: A prescription for anti-emetics will be called in today.  5. HIV: He is on Atripla and followed by ID.   6. thrombocytopenia: Likely related to his malignancy involvement of the bone marrow he has no bleeding at this time requires no transfusion.  7. Followup: Will be on 10/17/2013 for cycle 1 day 15 ABVD.  8. Neutropenic prophylaxis: No receive  Neulasta after each chemotherapy treatment.   [facility], MD 3/10/201511:10 AM

## 2013-09-30 NOTE — Telephone Encounter (Signed)
gv adn printed appt sched and avs for pt for March adn April....sed added tx. °

## 2013-10-02 ENCOUNTER — Ambulatory Visit (HOSPITAL_BASED_OUTPATIENT_CLINIC_OR_DEPARTMENT_OTHER): Payer: Medicare Other

## 2013-10-02 ENCOUNTER — Other Ambulatory Visit: Payer: Medicare Other

## 2013-10-02 ENCOUNTER — Encounter: Payer: Self-pay | Admitting: *Deleted

## 2013-10-02 VITALS — BP 100/78 | HR 77 | Temp 97.8°F | Resp 18

## 2013-10-02 DIAGNOSIS — C8190 Hodgkin lymphoma, unspecified, unspecified site: Secondary | ICD-10-CM

## 2013-10-02 DIAGNOSIS — B20 Human immunodeficiency virus [HIV] disease: Secondary | ICD-10-CM | POA: Diagnosis not present

## 2013-10-02 DIAGNOSIS — D539 Nutritional anemia, unspecified: Secondary | ICD-10-CM

## 2013-10-02 DIAGNOSIS — R5381 Other malaise: Secondary | ICD-10-CM | POA: Diagnosis not present

## 2013-10-02 DIAGNOSIS — D72819 Decreased white blood cell count, unspecified: Secondary | ICD-10-CM | POA: Diagnosis not present

## 2013-10-02 DIAGNOSIS — C8198 Hodgkin lymphoma, unspecified, lymph nodes of multiple sites: Secondary | ICD-10-CM | POA: Diagnosis not present

## 2013-10-02 DIAGNOSIS — R630 Anorexia: Secondary | ICD-10-CM | POA: Diagnosis not present

## 2013-10-02 DIAGNOSIS — L738 Other specified follicular disorders: Secondary | ICD-10-CM | POA: Diagnosis not present

## 2013-10-02 DIAGNOSIS — T451X5A Adverse effect of antineoplastic and immunosuppressive drugs, initial encounter: Secondary | ICD-10-CM | POA: Diagnosis not present

## 2013-10-02 DIAGNOSIS — R634 Abnormal weight loss: Secondary | ICD-10-CM | POA: Diagnosis not present

## 2013-10-02 DIAGNOSIS — D630 Anemia in neoplastic disease: Secondary | ICD-10-CM

## 2013-10-02 DIAGNOSIS — C8110 Nodular sclerosis classical Hodgkin lymphoma, unspecified site: Secondary | ICD-10-CM

## 2013-10-02 DIAGNOSIS — D696 Thrombocytopenia, unspecified: Secondary | ICD-10-CM | POA: Diagnosis not present

## 2013-10-02 DIAGNOSIS — D6481 Anemia due to antineoplastic chemotherapy: Secondary | ICD-10-CM | POA: Diagnosis not present

## 2013-10-02 DIAGNOSIS — R599 Enlarged lymph nodes, unspecified: Secondary | ICD-10-CM | POA: Diagnosis not present

## 2013-10-02 DIAGNOSIS — Z21 Asymptomatic human immunodeficiency virus [HIV] infection status: Secondary | ICD-10-CM | POA: Diagnosis not present

## 2013-10-02 DIAGNOSIS — N201 Calculus of ureter: Secondary | ICD-10-CM | POA: Diagnosis not present

## 2013-10-02 DIAGNOSIS — R5383 Other fatigue: Secondary | ICD-10-CM | POA: Diagnosis not present

## 2013-10-02 DIAGNOSIS — D649 Anemia, unspecified: Secondary | ICD-10-CM | POA: Diagnosis not present

## 2013-10-02 LAB — PREPARE RBC (CROSSMATCH)

## 2013-10-02 MED ORDER — HEPARIN SOD (PORK) LOCK FLUSH 100 UNIT/ML IV SOLN
250.0000 [IU] | INTRAVENOUS | Status: DC | PRN
  Filled 2013-10-02: qty 5

## 2013-10-02 MED ORDER — DIPHENHYDRAMINE HCL 25 MG PO CAPS
25.0000 mg | ORAL_CAPSULE | Freq: Once | ORAL | Status: AC
  Administered 2013-10-02: 25 mg via ORAL

## 2013-10-02 MED ORDER — DIPHENHYDRAMINE HCL 25 MG PO CAPS
ORAL_CAPSULE | ORAL | Status: AC
  Filled 2013-10-02: qty 1

## 2013-10-02 MED ORDER — ACETAMINOPHEN 325 MG PO TABS
ORAL_TABLET | ORAL | Status: AC
  Filled 2013-10-02: qty 2

## 2013-10-02 MED ORDER — SODIUM CHLORIDE 0.9 % IJ SOLN
10.0000 mL | INTRAMUSCULAR | Status: AC | PRN
  Administered 2013-10-02: 10 mL
  Filled 2013-10-02: qty 10

## 2013-10-02 MED ORDER — HEPARIN SOD (PORK) LOCK FLUSH 100 UNIT/ML IV SOLN
500.0000 [IU] | Freq: Once | INTRAVENOUS | Status: AC
  Administered 2013-10-02: 500 [IU] via INTRAVENOUS
  Filled 2013-10-02: qty 5

## 2013-10-02 MED ORDER — ACETAMINOPHEN 325 MG PO TABS
650.0000 mg | ORAL_TABLET | Freq: Once | ORAL | Status: AC
  Administered 2013-10-02: 650 mg via ORAL

## 2013-10-02 MED ORDER — SODIUM CHLORIDE 0.9 % IJ SOLN
3.0000 mL | INTRAMUSCULAR | Status: DC | PRN
  Filled 2013-10-02: qty 10

## 2013-10-02 NOTE — Patient Instructions (Signed)
Blood Transfusion Information WHAT IS A BLOOD TRANSFUSION? A transfusion is the replacement of blood or some of its parts. Blood is made up of multiple cells which provide different functions.  Red blood cells carry oxygen and are used for blood loss replacement.  White blood cells fight against infection.  Platelets control bleeding.  Plasma helps clot blood.  Other blood products are available for specialized needs, such as hemophilia or other clotting disorders. BEFORE THE TRANSFUSION  Who gives blood for transfusions?   You may be able to donate blood to be used at a later date on yourself (autologous donation).  Relatives can be asked to donate blood. This is generally not any safer than if you have received blood from a stranger. The same precautions are taken to ensure safety when a relative's blood is donated.  Healthy volunteers who are fully evaluated to make sure their blood is safe. This is blood bank blood. Transfusion therapy is the safest it has ever been in the practice of medicine. Before blood is taken from a donor, a complete history is taken to make sure that person has no history of diseases nor engages in risky social behavior (examples are intravenous drug use or sexual activity with multiple partners). The donor's travel history is screened to minimize risk of transmitting infections, such as malaria. The donated blood is tested for signs of infectious diseases, such as HIV and hepatitis. The blood is then tested to be sure it is compatible with you in order to minimize the chance of a transfusion reaction. If you or a relative donates blood, this is often done in anticipation of surgery and is not appropriate for emergency situations. It takes many days to process the donated blood. RISKS AND COMPLICATIONS Although transfusion therapy is very safe and saves many lives, the main dangers of transfusion include:   Getting an infectious disease.  Developing a  transfusion reaction. This is an allergic reaction to something in the blood you were given. Every precaution is taken to prevent this. The decision to have a blood transfusion has been considered carefully by your caregiver before blood is given. Blood is not given unless the benefits outweigh the risks. AFTER THE TRANSFUSION  Right after receiving a blood transfusion, you will usually feel much better and more energetic. This is especially true if your red blood cells have gotten low (anemic). The transfusion raises the level of the red blood cells which carry oxygen, and this usually causes an energy increase.  The nurse administering the transfusion will monitor you carefully for complications. HOME CARE INSTRUCTIONS  No special instructions are needed after a transfusion. You may find your energy is better. Speak with your caregiver about any limitations on activity for underlying diseases you may have. SEEK MEDICAL CARE IF:   Your condition is not improving after your transfusion.  You develop redness or irritation at the intravenous (IV) site. SEEK IMMEDIATE MEDICAL CARE IF:  Any of the following symptoms occur over the next 12 hours:  Shaking chills.  You have a temperature by mouth above 102 F (38.9 C), not controlled by medicine.  Chest, back, or muscle pain.  People around you feel you are not acting correctly or are confused.  Shortness of breath or difficulty breathing.  Dizziness and fainting.  You get a rash or develop hives.  You have a decrease in urine output.  Your urine turns a dark color or changes to pink, red, or brown. Any of the following   symptoms occur over the next 10 days:  You have a temperature by mouth above 102 F (38.9 C), not controlled by medicine.  Shortness of breath.  Weakness after normal activity.  The white part of the eye turns yellow (jaundice).  You have a decrease in the amount of urine or are urinating less often.  Your  urine turns a dark color or changes to pink, red, or brown. Document Released: 07/07/2000 Document Revised: 10/02/2011 Document Reviewed: 02/24/2008 [employer] Patient Information 2014 [employer].  

## 2013-10-02 NOTE — Progress Notes (Signed)
[facility] Psychosocial Distress Screening Clinical Social Work  Clinical Social Work was referred by distress screening protocol.  The patient scored a 10 on the Psychosocial Distress Thermometer which indicates severe distress. Clinical Social Worker Intern spoke with Patient in chemo to assess for distress and other psychosocial needs. Patient indicated issues on screen had been resolved.  Patient was given information for contacting [facility] if further assistance was needed.   Clinical Social Worker follow up needed: no  If yes, follow up plan:   Natanya Holecek S. Rarden Work Intern [employer] [phone]

## 2013-10-02 NOTE — Progress Notes (Signed)
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 States " makes me sweat really bad " .  Held per premed for blood and benadryl 25mg  given as ordered.  Informed ordering MD desk nurse who will alert MD.

## 2013-10-03 ENCOUNTER — Other Ambulatory Visit: Payer: Self-pay | Admitting: Oncology

## 2013-10-03 ENCOUNTER — Ambulatory Visit (HOSPITAL_BASED_OUTPATIENT_CLINIC_OR_DEPARTMENT_OTHER): Payer: Medicare Other

## 2013-10-03 VITALS — BP 92/62 | HR 93 | Temp 96.8°F | Resp 16

## 2013-10-03 DIAGNOSIS — N201 Calculus of ureter: Secondary | ICD-10-CM | POA: Diagnosis not present

## 2013-10-03 DIAGNOSIS — B20 Human immunodeficiency virus [HIV] disease: Secondary | ICD-10-CM | POA: Diagnosis not present

## 2013-10-03 DIAGNOSIS — Z21 Asymptomatic human immunodeficiency virus [HIV] infection status: Secondary | ICD-10-CM | POA: Diagnosis not present

## 2013-10-03 DIAGNOSIS — Z5189 Encounter for other specified aftercare: Secondary | ICD-10-CM | POA: Diagnosis not present

## 2013-10-03 DIAGNOSIS — C8190 Hodgkin lymphoma, unspecified, unspecified site: Secondary | ICD-10-CM

## 2013-10-03 DIAGNOSIS — Z5111 Encounter for antineoplastic chemotherapy: Secondary | ICD-10-CM

## 2013-10-03 DIAGNOSIS — R5383 Other fatigue: Secondary | ICD-10-CM | POA: Diagnosis not present

## 2013-10-03 DIAGNOSIS — R630 Anorexia: Secondary | ICD-10-CM | POA: Diagnosis not present

## 2013-10-03 DIAGNOSIS — D72819 Decreased white blood cell count, unspecified: Secondary | ICD-10-CM | POA: Diagnosis not present

## 2013-10-03 DIAGNOSIS — D630 Anemia in neoplastic disease: Secondary | ICD-10-CM | POA: Diagnosis not present

## 2013-10-03 DIAGNOSIS — R5381 Other malaise: Secondary | ICD-10-CM | POA: Diagnosis not present

## 2013-10-03 DIAGNOSIS — C8198 Hodgkin lymphoma, unspecified, lymph nodes of multiple sites: Secondary | ICD-10-CM

## 2013-10-03 DIAGNOSIS — D539 Nutritional anemia, unspecified: Secondary | ICD-10-CM | POA: Diagnosis not present

## 2013-10-03 DIAGNOSIS — R599 Enlarged lymph nodes, unspecified: Secondary | ICD-10-CM | POA: Diagnosis not present

## 2013-10-03 DIAGNOSIS — D649 Anemia, unspecified: Secondary | ICD-10-CM | POA: Diagnosis not present

## 2013-10-03 DIAGNOSIS — L738 Other specified follicular disorders: Secondary | ICD-10-CM | POA: Diagnosis not present

## 2013-10-03 DIAGNOSIS — R634 Abnormal weight loss: Secondary | ICD-10-CM | POA: Diagnosis not present

## 2013-10-03 DIAGNOSIS — D696 Thrombocytopenia, unspecified: Secondary | ICD-10-CM | POA: Diagnosis not present

## 2013-10-03 LAB — TYPE AND SCREEN
ABO/RH(D): O POS
Antibody Screen: NEGATIVE
Unit division: 0
Unit division: 0

## 2013-10-03 MED ORDER — DEXAMETHASONE SODIUM PHOSPHATE 20 MG/5ML IJ SOLN
20.0000 mg | Freq: Once | INTRAMUSCULAR | Status: AC
  Administered 2013-10-03: 20 mg via INTRAVENOUS

## 2013-10-03 MED ORDER — BLEOMYCIN SULFATE CHEMO INJECTION 30 UNIT
9.0000 [IU]/m2 | Freq: Once | INTRAMUSCULAR | Status: AC
  Administered 2013-10-03: 15 [IU] via INTRAVENOUS
  Filled 2013-10-03: qty 5

## 2013-10-03 MED ORDER — SODIUM CHLORIDE 0.9 % IJ SOLN
10.0000 mL | INTRAMUSCULAR | Status: DC | PRN
  Filled 2013-10-03: qty 10

## 2013-10-03 MED ORDER — VINBLASTINE SULFATE CHEMO INJECTION 1 MG/ML
6.2000 mg/m2 | Freq: Once | INTRAVENOUS | Status: AC
  Administered 2013-10-03: 10 mg via INTRAVENOUS
  Filled 2013-10-03: qty 10

## 2013-10-03 MED ORDER — DOXORUBICIN HCL CHEMO IV INJECTION 2 MG/ML
25.0000 mg/m2 | Freq: Once | INTRAVENOUS | Status: AC
  Administered 2013-10-03: 40 mg via INTRAVENOUS
  Filled 2013-10-03: qty 20

## 2013-10-03 MED ORDER — ONDANSETRON 16 MG/50ML IVPB (CHCC)
INTRAVENOUS | Status: AC
  Filled 2013-10-03: qty 16

## 2013-10-03 MED ORDER — HEPARIN SOD (PORK) LOCK FLUSH 100 UNIT/ML IV SOLN
500.0000 [IU] | Freq: Once | INTRAVENOUS | Status: AC | PRN
  Administered 2013-10-03: 500 [IU]
  Filled 2013-10-03: qty 5

## 2013-10-03 MED ORDER — SODIUM CHLORIDE 0.9 % IV SOLN
375.0000 mg/m2 | Freq: Once | INTRAVENOUS | Status: AC
  Administered 2013-10-03: 600 mg via INTRAVENOUS
  Filled 2013-10-03: qty 30

## 2013-10-03 MED ORDER — ONDANSETRON 16 MG/50ML IVPB (CHCC)
16.0000 mg | Freq: Once | INTRAVENOUS | Status: AC
  Administered 2013-10-03: 16 mg via INTRAVENOUS

## 2013-10-03 MED ORDER — DEXAMETHASONE SODIUM PHOSPHATE 20 MG/5ML IJ SOLN
INTRAMUSCULAR | Status: AC
  Filled 2013-10-03: qty 5

## 2013-10-03 MED ORDER — SODIUM CHLORIDE 0.9 % IV SOLN
Freq: Once | INTRAVENOUS | Status: AC
  Administered 2013-10-03: 11:00:00 via INTRAVENOUS

## 2013-10-03 NOTE — Patient Instructions (Addendum)
[facility] Discharge Instructions for Patients Receiving Chemotherapy  Today you received the following chemotherapy agents: Doxorubicin, Vinblastine, Bleocin, Dacarbazine   To help prevent nausea and vomiting after your treatment, we encourage you to take your nausea medication as prescribed by your physician: Compazine 10 mg every 6 hrs as needed. TAKE ONE DOSE TONIGHT BEFORE BEDTIME, REGARDLESS IF YOU ARE NAUSEATED OR NOT.    If you develop nausea and vomiting that is not controlled by your nausea medication, call the clinic.   BELOW ARE SYMPTOMS THAT SHOULD BE REPORTED IMMEDIATELY:  *FEVER GREATER THAN 100.5 F  *CHILLS WITH OR WITHOUT FEVER  NAUSEA AND VOMITING THAT IS NOT CONTROLLED WITH YOUR NAUSEA MEDICATION  *UNUSUAL SHORTNESS OF BREATH  *UNUSUAL BRUISING OR BLEEDING  TENDERNESS IN MOUTH AND THROAT WITH OR WITHOUT PRESENCE OF ULCERS  *URINARY PROBLEMS  *BOWEL PROBLEMS  UNUSUAL RASH Items with * indicate a potential emergency and should be followed up as soon as possible.  Feel free to call the clinic you have any questions or concerns. The clinic phone number is [phone].   Doxorubicin injection What is this medicine? DOXORUBICIN (dox oh ROO bi sin) is a chemotherapy drug. It is used to treat many kinds of cancer like Hodgkin's disease, leukemia, non-Hodgkin's lymphoma, neuroblastoma, sarcoma, and Wilms' tumor. It is also used to treat bladder cancer, breast cancer, lung cancer, ovarian cancer, stomach cancer, and thyroid cancer. This medicine may be used for other purposes; ask your health care provider or pharmacist if you have questions. COMMON BRAND NAME(S): Adriamycin PFS, Adriamycin RDF, Adriamycin, Rubex What should I tell my health care provider before I take this medicine? They need to know if you have any of these conditions: -blood disorders -heart disease, recent heart attack -infection (especially a virus infection such as  chickenpox, cold sores, or herpes) -irregular heartbeat -liver disease -recent or ongoing radiation therapy -an unusual or allergic reaction to doxorubicin, other chemotherapy agents, other medicines, foods, dyes, or preservatives -pregnant or trying to get pregnant -breast-feeding How should I use this medicine? This drug is given as an infusion into a vein. It is administered in a hospital or clinic by a specially trained health care professional. If you have pain, swelling, burning or any unusual feeling around the site of your injection, tell your health care professional right away. Talk to your pediatrician regarding the use of this medicine in children. Special care may be needed. Overdosage: If you think you have taken too much of this medicine contact a poison control center or emergency room at once. NOTE: This medicine is only for you. Do not share this medicine with others. What if I miss a dose? It is important not to miss your dose. Call your doctor or health care professional if you are unable to keep an appointment. What may interact with this medicine? Do not take this medicine with any of the following medications: -cisapride -droperidol -halofantrine -pimozide -zidovudine This medicine may also interact with the following medications: -chloroquine -chlorpromazine -clarithromycin -cyclophosphamide -cyclosporine -erythromycin -medicines for depression, anxiety, or psychotic disturbances -medicines for irregular heart beat like amiodarone, bepridil, dofetilide, encainide, flecainide, propafenone, quinidine -medicines for seizures like ethotoin, fosphenytoin, phenytoin -medicines for nausea, vomiting like dolasetron, ondansetron, palonosetron -medicines to increase blood counts like filgrastim, pegfilgrastim, sargramostim -methadone -methotrexate -pentamidine -progesterone -vaccines -verapamil Talk to your doctor or health care professional before taking any of  these medicines: -acetaminophen -aspirin -ibuprofen -ketoprofen -naproxen This list may not describe all possible interactions.  Give your health care provider a list of all the medicines, herbs, non-prescription drugs, or dietary supplements you use. Also tell them if you smoke, drink alcohol, or use illegal drugs. Some items may interact with your medicine. What should I watch for while using this medicine? Your condition will be monitored carefully while you are receiving this medicine. You will need important blood work done while you are taking this medicine. This drug may make you feel generally unwell. This is not uncommon, as chemotherapy can affect healthy cells as well as cancer cells. Report any side effects. Continue your course of treatment even though you feel ill unless your doctor tells you to stop. Your urine may turn red for a few days after your dose. This is not blood. If your urine is dark or brown, call your doctor. In some cases, you may be given additional medicines to help with side effects. Follow all directions for their use. Call your doctor or health care professional for advice if you get a fever, chills or sore throat, or other symptoms of a cold or flu. Do not treat yourself. This drug decreases your body's ability to fight infections. Try to avoid being around people who are sick. This medicine may increase your risk to bruise or bleed. Call your doctor or health care professional if you notice any unusual bleeding. Be careful brushing and flossing your teeth or using a toothpick because you may get an infection or bleed more easily. If you have any dental work done, tell your dentist you are receiving this medicine. Avoid taking products that contain aspirin, acetaminophen, ibuprofen, naproxen, or ketoprofen unless instructed by your doctor. These medicines may hide a fever. Men and women of childbearing age should use effective birth control methods while using  taking this medicine. Do not become pregnant while taking this medicine. There is a potential for serious side effects to an unborn child. Talk to your health care professional or pharmacist for more information. Do not breast-feed an infant while taking this medicine. Do not let others touch your urine or other body fluids for 5 days after each treatment with this medicine. Caregivers should wear latex gloves to avoid touching body fluids during this time. There is a maximum amount of this medicine you should receive throughout your life. The amount depends on the medical condition being treated and your overall health. Your doctor will watch how much of this medicine you receive in your lifetime. Tell your doctor if you have taken this medicine before. What side effects may I notice from receiving this medicine? Side effects that you should report to your doctor or health care professional as soon as possible: -allergic reactions like skin rash, itching or hives, swelling of the face, lips, or tongue -low blood counts - this medicine may decrease the number of white blood cells, red blood cells and platelets. You may be at increased risk for infections and bleeding. -signs of infection - fever or chills, cough, sore throat, pain or difficulty passing urine -signs of decreased platelets or bleeding - bruising, pinpoint red spots on the skin, black, tarry stools, blood in the urine -signs of decreased red blood cells - unusually weak or tired, fainting spells, lightheadedness -breathing problems -chest pain -fast, irregular heartbeat -mouth sores -nausea, vomiting -pain, swelling, redness at site where injected -pain, tingling, numbness in the hands or feet -swelling of ankles, feet, or hands -unusual bleeding or bruising Side effects that usually do not require medical attention (report  to your doctor or health care professional if they continue or are bothersome): -diarrhea -facial  flushing -hair loss -loss of appetite -missed menstrual periods -nail discoloration or damage -red or watery eyes -red colored urine -stomach upset This list may not describe all possible side effects. Call your doctor for medical advice about side effects. You may report side effects to FDA at 1-800-FDA-1088. Where should I keep my medicine? This drug is given in a hospital or clinic and will not be stored at home. NOTE: This sheet is a summary. It may not cover all possible information. If you have questions about this medicine, talk to your doctor, pharmacist, or health care provider.  2014, [employer]. (2012-11-05 09:54:34)   Vinblastine injection What is this medicine? VINBLASTINE (vin BLAS teen) is a chemotherapy drug. It slows the growth of cancer cells. This medicine is used to treat many types of cancer like breast cancer, testicular cancer, Hodgkin's disease, non-Hodgkin's lymphoma, and sarcoma. This medicine may be used for other purposes; ask your health care provider or pharmacist if you have questions. COMMON BRAND NAME(S): Velban What should I tell my health care provider before I take this medicine? They need to know if you have any of these conditions: -blood disorders -dental disease -gout -infection (especially a virus infection such as chickenpox, cold sores, or herpes) -liver disease -lung disease -nervous system disease -recent or ongoing radiation therapy -an unusual or allergic reaction to vinblastine, other chemotherapy agents, other medicines, foods, dyes, or preservatives -pregnant or trying to get pregnant -breast-feeding How should I use this medicine? This drug is given as an infusion into a vein. It is administered in a hospital or clinic by a specially trained health care professional. If you have pain, swelling, burning or any unusual feeling around the site of your injection, tell your health care professional right away. Talk to your  pediatrician regarding the use of this medicine in children. While this drug may be prescribed for selected conditions, precautions do apply. Overdosage: If you think you have taken too much of this medicine contact a poison control center or emergency room at once. NOTE: This medicine is only for you. Do not share this medicine with others. What if I miss a dose? It is important not to miss your dose. Call your doctor or health care professional if you are unable to keep an appointment. What may interact with this medicine? Do not take this medicine with any of the following medications: -erythromycin -itraconazole -mibefradil -voriconazole This medicine may also interact with the following medications: -cyclosporine -fluconazole -ketoconazole -medicines for seizures like phenytoin -medicines to increase blood counts like filgrastim, pegfilgrastim, sargramostim -vaccines -verapamil Talk to your doctor or health care professional before taking any of these medicines: -acetaminophen -aspirin -ibuprofen -ketoprofen -naproxen This list may not describe all possible interactions. Give your health care provider a list of all the medicines, herbs, non-prescription drugs, or dietary supplements you use. Also tell them if you smoke, drink alcohol, or use illegal drugs. Some items may interact with your medicine. What should I watch for while using this medicine? Your condition will be monitored carefully while you are receiving this medicine. You will need important blood work done while you are taking this medicine. This drug may make you feel generally unwell. This is not uncommon, as chemotherapy can affect healthy cells as well as cancer cells. Report any side effects. Continue your course of treatment even though you feel ill unless your doctor tells you to  stop. In some cases, you may be given additional medicines to help with side effects. Follow all directions for their use. Call your  doctor or health care professional for advice if you get a fever, chills or sore throat, or other symptoms of a cold or flu. Do not treat yourself. This drug decreases your body's ability to fight infections. Try to avoid being around people who are sick. This medicine may increase your risk to bruise or bleed. Call your doctor or health care professional if you notice any unusual bleeding. Be careful brushing and flossing your teeth or using a toothpick because you may get an infection or bleed more easily. If you have any dental work done, tell your dentist you are receiving this medicine. Avoid taking products that contain aspirin, acetaminophen, ibuprofen, naproxen, or ketoprofen unless instructed by your doctor. These medicines may hide a fever. Do not become pregnant while taking this medicine. Women should inform their doctor if they wish to become pregnant or think they might be pregnant. There is a potential for serious side effects to an unborn child. Talk to your health care professional or pharmacist for more information. Do not breast-feed an infant while taking this medicine. Men may have a lower sperm count while taking this medicine. Talk to your doctor if you plan to father a child. What side effects may I notice from receiving this medicine? Side effects that you should report to your doctor or health care professional as soon as possible: -allergic reactions like skin rash, itching or hives, swelling of the face, lips, or tongue -low blood counts - This drug may decrease the number of white blood cells, red blood cells and platelets. You may be at increased risk for infections and bleeding. -signs of infection - fever or chills, cough, sore throat, pain or difficulty passing urine -signs of decreased platelets or bleeding - bruising, pinpoint red spots on the skin, black, tarry stools, nosebleeds -signs of decreased red blood cells - unusually weak or tired, fainting spells,  lightheadedness -breathing problems -changes in hearing -change in the amount of urine -chest pain -high blood pressure -mouth sores -nausea and vomiting -pain, swelling, redness or irritation at the injection site -pain, tingling, numbness in the hands or feet -problems with balance, dizziness -seizures Side effects that usually do not require medical attention (report to your doctor or health care professional if they continue or are bothersome): -constipation -hair loss -jaw pain -loss of appetite -sensitivity to light -stomach pain -tumor pain This list may not describe all possible side effects. Call your doctor for medical advice about side effects. You may report side effects to FDA at 1-800-FDA-1088. Where should I keep my medicine? This drug is given in a hospital or clinic and will not be stored at home. NOTE: This sheet is a summary. It may not cover all possible information. If you have questions about this medicine, talk to your doctor, pharmacist, or health care provider.  2014, [employer] Standard. (2008-04-06 17:15:59)   Bleomycin injection What is this medicine? BLEOMYCIN (blee oh MYE sin) is a chemotherapy drug. It is used to treat many kinds of cancer like lymphoma, cervical cancer, head and neck cancer, and testicular cancer. It is also used to prevent and to treat fluid build-up around the lungs caused by some cancers. This medicine may be used for other purposes; ask your health care provider or pharmacist if you have questions. COMMON BRAND NAME(S): Blenoxane What should I tell my health care provider  before I take this medicine? They need to know if you have any of these conditions: -cigarette smoker -kidney disease -lung disease -recent or ongoing radiation therapy -an unusual or allergic reaction to bleomycin, other chemotherapy agents, other medicines, foods, dyes, or preservatives -pregnant or trying to get pregnant -breast-feeding How should I  use this medicine? This drug is given as an infusion into a vein or a body cavity. It can also be given as an injection into a muscle or under the skin. It is administered in a hospital or clinic by a specially trained health care professional. Talk to your pediatrician regarding the use of this medicine in children. Special care may be needed. Overdosage: If you think you have taken too much of this medicine contact a poison control center or emergency room at once. NOTE: This medicine is only for you. Do not share this medicine with others. What if I miss a dose? It is important not to miss your dose. Call your doctor or health care professional if you are unable to keep an appointment. What may interact with this medicine? -certain antibiotics given by injection -cisplatin -cyclosporine -diuretics -foscarnet -medicines to increase blood counts like filgrastim, pegfilgrastim, sargramostim -vaccines This list may not describe all possible interactions. Give your health care provider a list of all the medicines, herbs, non-prescription drugs, or dietary supplements you use. Also tell them if you smoke, drink alcohol, or use illegal drugs. Some items may interact with your medicine. What should I watch for while using this medicine? Visit your doctor for checks on your progress. This drug may make you feel generally unwell. This is not uncommon, as chemotherapy can affect healthy cells as well as cancer cells. Report any side effects. Continue your course of treatment even though you feel ill unless your doctor tells you to stop. Call your doctor or health care professional for advice if you get a fever, chills or sore throat, or other symptoms of a cold or flu. Do not treat yourself. This drug decreases your body's ability to fight infections. Try to avoid being around people who are sick. Avoid taking products that contain aspirin, acetaminophen, ibuprofen, naproxen, or ketoprofen unless  instructed by your doctor. These medicines may hide a fever. Do not become pregnant while taking this medicine. Women should inform their doctor if they wish to become pregnant or think they might be pregnant. There is a potential for serious side effects to an unborn child. Talk to your health care professional or pharmacist for more information. Do not breast-feed an infant while taking this medicine. There is a maximum amount of this medicine you should receive throughout your life. The amount depends on the medical condition being treated and your overall health. Your doctor will watch how much of this medicine you receive in your lifetime. Tell your doctor if you have taken this medicine before. What side effects may I notice from receiving this medicine? Side effects that you should report to your doctor or health care professional as soon as possible: -allergic reactions like skin rash, itching or hives, swelling of the face, lips, or tongue -breathing problems -chest pain -confusion -cough -fast, irregular heartbeat -feeling faint or lightheaded, falls -fever or chills -mouth sores -pain, tingling, numbness in the hands or feet -trouble passing urine or change in the amount of urine -yellowing of the eyes or skin Side effects that usually do not require medical attention (report to your doctor or health care professional if they continue or  are bothersome): -darker skin color -hair loss -irritation at site where injected -loss of appetite -nail changes -nausea and vomiting -weight loss This list may not describe all possible side effects. Call your doctor for medical advice about side effects. You may report side effects to FDA at 1-800-FDA-1088. Where should I keep my medicine? This drug is given in a hospital or clinic and will not be stored at home. NOTE: This sheet is a summary. It may not cover all possible information. If you have questions about this medicine, talk to your  doctor, pharmacist, or health care provider.  2014, [employer] Standard. (2012-11-05 09:36:48)   Dacarbazine, DTIC injection What is this medicine? DACARBAZINE (da KAR ba zeen) is a chemotherapy drug. This medicine is used to treat skin cancer. It is also used with other medicines to treat Hodgkin's disease. This medicine may be used for other purposes; ask your health care provider or pharmacist if you have questions. COMMON BRAND NAME(S): DTIC-Dome What should I tell my health care provider before I take this medicine? They need to know if you have any of these conditions: -infection (especially virus infection such as chickenpox, cold sores, or herpes) -kidney disease -liver disease -low blood counts like low platelets, red blood cells, white blood cells -recent radiation therapy -an unusual or allergic reaction to dacarbazine, other chemotherapy agents, other medicines, foods, dyes, or preservatives -pregnant or trying to get pregnant -breast-feeding How should I use this medicine? This drug is given as an injection or infusion into a vein. It is administered in a hospital or clinic by a specially trained health care professional. Talk to your pediatrician regarding the use of this medicine in children. While this drug may be prescribed for selected conditions, precautions do apply. Overdosage: If you think you have taken too much of this medicine contact a poison control center or emergency room at once. NOTE: This medicine is only for you. Do not share this medicine with others. What if I miss a dose? It is important not to miss your dose. Call your doctor or health care professional if you are unable to keep an appointment. What may interact with this medicine? -medicines to increase blood counts like filgrastim, pegfilgrastim, sargramostim -vaccines This list may not describe all possible interactions. Give your health care provider a list of all the medicines, herbs,  non-prescription drugs, or dietary supplements you use. Also tell them if you smoke, drink alcohol, or use illegal drugs. Some items may interact with your medicine. What should I watch for while using this medicine? Your condition will be monitored carefully while you are receiving this medicine. You will need important blood work done while you are taking this medicine. This drug may make you feel generally unwell. This is not uncommon, as chemotherapy can affect healthy cells as well as cancer cells. Report any side effects. Continue your course of treatment even though you feel ill unless your doctor tells you to stop. Call your doctor or health care professional for advice if you get a fever, chills or sore throat, or other symptoms of a cold or flu. Do not treat yourself. This drug decreases your body's ability to fight infections. Try to avoid being around people who are sick. This medicine may increase your risk to bruise or bleed. Call your doctor or health care professional if you notice any unusual bleeding. Be careful brushing and flossing your teeth or using a toothpick because you may get an infection or bleed more easily. If  you have any dental work done, tell your dentist you are receiving this medicine. Avoid taking products that contain aspirin, acetaminophen, ibuprofen, naproxen, or ketoprofen unless instructed by your doctor. These medicines may hide a fever. Do not become pregnant while taking this medicine. Women should inform their doctor if they wish to become pregnant or think they might be pregnant. There is a potential for serious side effects to an unborn child. Talk to your health care professional or pharmacist for more information. Do not breast-feed an infant while taking this medicine. What side effects may I notice from receiving this medicine? Side effects that you should report to your doctor or health care professional as soon as possible: -allergic reactions like skin  rash, itching or hives, swelling of the face, lips, or tongue -low blood counts - this medicine may decrease the number of white blood cells, red blood cells and platelets. You may be at increased risk for infections and bleeding. -signs of infection - fever or chills, cough, sore throat, pain or difficulty passing urine -signs of decreased platelets or bleeding - bruising, pinpoint red spots on the skin, black, tarry stools, blood in the urine -signs of decreased red blood cells - unusually weak or tired, fainting spells, lightheadedness -breathing problems -muscle pains -pain at site where injected -trouble passing urine or change in the amount of urine -vomiting -yellowing of the eyes or skin Side effects that usually do not require medical attention (report to your doctor or health care professional if they continue or are bothersome): -diarrhea -hair loss -loss of appetite -nausea -skin more sensitive to sun or ultraviolet light -stomach upset This list may not describe all possible side effects. Call your doctor for medical advice about side effects. You may report side effects to FDA at 1-800-FDA-1088. Where should I keep my medicine? This drug is given in a hospital or clinic and will not be stored at home. NOTE: This sheet is a summary. It may not cover all possible information. If you have questions about this medicine, talk to your doctor, pharmacist, or health care provider.  2014, Elsevier/Gold Standard. (2007-10-29 16:56:39)

## 2013-10-03 NOTE — Progress Notes (Signed)
Per Dr. Alen Blew, okay to treat with 09/30/13 labs.

## 2013-10-04 ENCOUNTER — Ambulatory Visit: Payer: Medicare Other

## 2013-10-06 ENCOUNTER — Telehealth: Payer: Self-pay | Admitting: *Deleted

## 2013-10-06 ENCOUNTER — Ambulatory Visit (HOSPITAL_BASED_OUTPATIENT_CLINIC_OR_DEPARTMENT_OTHER): Payer: Medicare Other

## 2013-10-06 VITALS — BP 90/55 | HR 102 | Temp 97.2°F

## 2013-10-06 DIAGNOSIS — Z5111 Encounter for antineoplastic chemotherapy: Secondary | ICD-10-CM | POA: Diagnosis not present

## 2013-10-06 DIAGNOSIS — R5381 Other malaise: Secondary | ICD-10-CM | POA: Diagnosis not present

## 2013-10-06 DIAGNOSIS — C8190 Hodgkin lymphoma, unspecified, unspecified site: Secondary | ICD-10-CM

## 2013-10-06 DIAGNOSIS — C8198 Hodgkin lymphoma, unspecified, lymph nodes of multiple sites: Secondary | ICD-10-CM

## 2013-10-06 DIAGNOSIS — D696 Thrombocytopenia, unspecified: Secondary | ICD-10-CM | POA: Diagnosis not present

## 2013-10-06 DIAGNOSIS — Z5189 Encounter for other specified aftercare: Secondary | ICD-10-CM | POA: Diagnosis not present

## 2013-10-06 DIAGNOSIS — Z21 Asymptomatic human immunodeficiency virus [HIV] infection status: Secondary | ICD-10-CM | POA: Diagnosis not present

## 2013-10-06 DIAGNOSIS — D649 Anemia, unspecified: Secondary | ICD-10-CM | POA: Diagnosis not present

## 2013-10-06 DIAGNOSIS — B20 Human immunodeficiency virus [HIV] disease: Secondary | ICD-10-CM | POA: Diagnosis not present

## 2013-10-06 DIAGNOSIS — D539 Nutritional anemia, unspecified: Secondary | ICD-10-CM | POA: Diagnosis not present

## 2013-10-06 DIAGNOSIS — R630 Anorexia: Secondary | ICD-10-CM | POA: Diagnosis not present

## 2013-10-06 DIAGNOSIS — N201 Calculus of ureter: Secondary | ICD-10-CM | POA: Diagnosis not present

## 2013-10-06 DIAGNOSIS — D72819 Decreased white blood cell count, unspecified: Secondary | ICD-10-CM | POA: Diagnosis not present

## 2013-10-06 DIAGNOSIS — D630 Anemia in neoplastic disease: Secondary | ICD-10-CM | POA: Diagnosis not present

## 2013-10-06 DIAGNOSIS — L738 Other specified follicular disorders: Secondary | ICD-10-CM | POA: Diagnosis not present

## 2013-10-06 DIAGNOSIS — R634 Abnormal weight loss: Secondary | ICD-10-CM | POA: Diagnosis not present

## 2013-10-06 DIAGNOSIS — R599 Enlarged lymph nodes, unspecified: Secondary | ICD-10-CM | POA: Diagnosis not present

## 2013-10-06 MED ORDER — PEGFILGRASTIM INJECTION 6 MG/0.6ML
6.0000 mg | Freq: Once | SUBCUTANEOUS | Status: AC
  Administered 2013-10-06: 6 mg via SUBCUTANEOUS
  Filled 2013-10-06: qty 0.6

## 2013-10-06 NOTE — Patient Instructions (Signed)

## 2013-10-06 NOTE — Telephone Encounter (Addendum)
Mother called to report they spoke with Dr. Alen Blew and was told to come to office today for Thomas [name] to receive his Neulasta injection that he missed on Saturday. She had no transportation for this Saturday. Is waiting for her ride to call and let them know when they can bring Thomas [name] in. Tentatively put him down with flush nurse today at 1:30 pm.

## 2013-10-06 NOTE — Telephone Encounter (Addendum)
Spoke to patient when he arrived for Neulasta injection. Eating well and pushing fluids. Had one episode of emesis yesterday that was resolved with Compazine dose. Reviewed Compazine dosing with him. He has mild constipation and reports his mother has purchased a laxative for him. Told him to have BM at least every 3 days. He adds that coming in on Saturday is difficult due to the [place] service he uses does not run on Saturday and can be difficult to get a ride. Asking about changing treatment day to avoid Saturday appointments. Forwarded this request to MD for approval/schedule change.

## 2013-10-06 NOTE — Telephone Encounter (Signed)
Left VM for patient to call office and request triage nurse.

## 2013-10-17 ENCOUNTER — Ambulatory Visit (HOSPITAL_BASED_OUTPATIENT_CLINIC_OR_DEPARTMENT_OTHER): Payer: Medicare Other | Admitting: Physician Assistant

## 2013-10-17 ENCOUNTER — Ambulatory Visit (HOSPITAL_BASED_OUTPATIENT_CLINIC_OR_DEPARTMENT_OTHER): Payer: Medicare Other

## 2013-10-17 ENCOUNTER — Encounter: Payer: Self-pay | Admitting: Physician Assistant

## 2013-10-17 ENCOUNTER — Other Ambulatory Visit (HOSPITAL_BASED_OUTPATIENT_CLINIC_OR_DEPARTMENT_OTHER): Payer: Medicare Other

## 2013-10-17 ENCOUNTER — Telehealth: Payer: Self-pay | Admitting: Oncology

## 2013-10-17 VITALS — BP 95/64 | HR 86 | Temp 97.5°F | Resp 17 | Ht 73.0 in | Wt 113.5 lb

## 2013-10-17 VITALS — BP 87/55 | HR 93 | Temp 97.7°F | Resp 16

## 2013-10-17 DIAGNOSIS — D630 Anemia in neoplastic disease: Secondary | ICD-10-CM

## 2013-10-17 DIAGNOSIS — C8190 Hodgkin lymphoma, unspecified, unspecified site: Secondary | ICD-10-CM

## 2013-10-17 DIAGNOSIS — C8198 Hodgkin lymphoma, unspecified, lymph nodes of multiple sites: Secondary | ICD-10-CM

## 2013-10-17 DIAGNOSIS — Z21 Asymptomatic human immunodeficiency virus [HIV] infection status: Secondary | ICD-10-CM | POA: Diagnosis not present

## 2013-10-17 DIAGNOSIS — N201 Calculus of ureter: Secondary | ICD-10-CM | POA: Diagnosis not present

## 2013-10-17 DIAGNOSIS — R599 Enlarged lymph nodes, unspecified: Secondary | ICD-10-CM | POA: Diagnosis not present

## 2013-10-17 DIAGNOSIS — D6481 Anemia due to antineoplastic chemotherapy: Secondary | ICD-10-CM

## 2013-10-17 DIAGNOSIS — T451X5A Adverse effect of antineoplastic and immunosuppressive drugs, initial encounter: Secondary | ICD-10-CM

## 2013-10-17 DIAGNOSIS — B20 Human immunodeficiency virus [HIV] disease: Secondary | ICD-10-CM | POA: Diagnosis not present

## 2013-10-17 DIAGNOSIS — D696 Thrombocytopenia, unspecified: Secondary | ICD-10-CM

## 2013-10-17 DIAGNOSIS — Z5111 Encounter for antineoplastic chemotherapy: Secondary | ICD-10-CM

## 2013-10-17 DIAGNOSIS — D72819 Decreased white blood cell count, unspecified: Secondary | ICD-10-CM | POA: Diagnosis not present

## 2013-10-17 DIAGNOSIS — D539 Nutritional anemia, unspecified: Secondary | ICD-10-CM | POA: Diagnosis not present

## 2013-10-17 DIAGNOSIS — L738 Other specified follicular disorders: Secondary | ICD-10-CM | POA: Diagnosis not present

## 2013-10-17 DIAGNOSIS — R634 Abnormal weight loss: Secondary | ICD-10-CM | POA: Diagnosis not present

## 2013-10-17 LAB — COMPREHENSIVE METABOLIC PANEL (CC13)
ALBUMIN: 2.2 g/dL — AB (ref 3.5–5.0)
ALK PHOS: 140 U/L (ref 40–150)
ALT: 10 U/L (ref 0–55)
AST: 14 U/L (ref 5–34)
Anion Gap: 10 mEq/L (ref 3–11)
BILIRUBIN TOTAL: 0.24 mg/dL (ref 0.20–1.20)
BUN: 18.4 mg/dL (ref 7.0–26.0)
CO2: 24 mEq/L (ref 22–29)
Calcium: 8.9 mg/dL (ref 8.4–10.4)
Chloride: 109 mEq/L (ref 98–109)
Creatinine: 1.4 mg/dL — ABNORMAL HIGH (ref 0.7–1.3)
GLUCOSE: 89 mg/dL (ref 70–140)
POTASSIUM: 3.8 meq/L (ref 3.5–5.1)
SODIUM: 143 meq/L (ref 136–145)
Total Protein: 7 g/dL (ref 6.4–8.3)

## 2013-10-17 LAB — CBC WITH DIFFERENTIAL/PLATELET
BASO%: 0.3 % (ref 0.0–2.0)
Basophils Absolute: 0 10*3/uL (ref 0.0–0.1)
EOS%: 0.3 % (ref 0.0–7.0)
Eosinophils Absolute: 0 10*3/uL (ref 0.0–0.5)
HCT: 23.4 % — ABNORMAL LOW (ref 38.4–49.9)
HGB: 7.5 g/dL — ABNORMAL LOW (ref 13.0–17.1)
LYMPH%: 18 % (ref 14.0–49.0)
MCH: 32.9 pg (ref 27.2–33.4)
MCHC: 32.2 g/dL (ref 32.0–36.0)
MCV: 102.4 fL — AB (ref 79.3–98.0)
MONO#: 0.5 10*3/uL (ref 0.1–0.9)
MONO%: 4.1 % (ref 0.0–14.0)
NEUT%: 77.3 % — ABNORMAL HIGH (ref 39.0–75.0)
NEUTROS ABS: 9.8 10*3/uL — AB (ref 1.5–6.5)
Platelets: 77 10*3/uL — ABNORMAL LOW (ref 140–400)
RBC: 2.28 10*6/uL — AB (ref 4.20–5.82)
RDW: 27.4 % — ABNORMAL HIGH (ref 11.0–14.6)
WBC: 12.6 10*3/uL — ABNORMAL HIGH (ref 4.0–10.3)
lymph#: 2.3 10*3/uL (ref 0.9–3.3)

## 2013-10-17 LAB — PREPARE RBC (CROSSMATCH)

## 2013-10-17 MED ORDER — SODIUM CHLORIDE 0.9 % IV SOLN
9.5000 [IU]/m2 | Freq: Once | INTRAVENOUS | Status: AC
  Administered 2013-10-17: 15 [IU] via INTRAVENOUS
  Filled 2013-10-17: qty 5

## 2013-10-17 MED ORDER — ACETAMINOPHEN 325 MG PO TABS: 650.0000 mg | ORAL_TABLET | Freq: Once | ORAL | Status: DC

## 2013-10-17 MED ORDER — DIPHENHYDRAMINE HCL 25 MG PO CAPS
ORAL_CAPSULE | ORAL | Status: AC
  Filled 2013-10-17: qty 1

## 2013-10-17 MED ORDER — SODIUM CHLORIDE 0.9 % IV SOLN
6.2000 mg/m2 | Freq: Once | INTRAVENOUS | Status: AC
  Administered 2013-10-17: 10 mg via INTRAVENOUS
  Filled 2013-10-17: qty 10

## 2013-10-17 MED ORDER — DEXAMETHASONE SODIUM PHOSPHATE 20 MG/5ML IJ SOLN
20.0000 mg | Freq: Once | INTRAMUSCULAR | Status: AC
  Administered 2013-10-17: 20 mg via INTRAVENOUS

## 2013-10-17 MED ORDER — ONDANSETRON 16 MG/50ML IVPB (CHCC)
16.0000 mg | Freq: Once | INTRAVENOUS | Status: AC
  Administered 2013-10-17: 16 mg via INTRAVENOUS

## 2013-10-17 MED ORDER — DOXORUBICIN HCL CHEMO IV INJECTION 2 MG/ML
25.0000 mg/m2 | Freq: Once | INTRAVENOUS | Status: AC
  Administered 2013-10-17: 40 mg via INTRAVENOUS
  Filled 2013-10-17: qty 20

## 2013-10-17 MED ORDER — SODIUM CHLORIDE 0.9 % IJ SOLN
10.0000 mL | INTRAMUSCULAR | Status: DC | PRN
  Filled 2013-10-17: qty 10

## 2013-10-17 MED ORDER — IBUPROFEN 200 MG PO TABS
ORAL_TABLET | ORAL | Status: AC
  Filled 2013-10-17: qty 3

## 2013-10-17 MED ORDER — SODIUM CHLORIDE 0.9 % IJ SOLN
10.0000 mL | INTRAMUSCULAR | Status: AC | PRN
  Administered 2013-10-17: 10 mL
  Filled 2013-10-17: qty 10

## 2013-10-17 MED ORDER — ONDANSETRON 16 MG/50ML IVPB (CHCC)
INTRAVENOUS | Status: AC
  Filled 2013-10-17: qty 16

## 2013-10-17 MED ORDER — HEPARIN SOD (PORK) LOCK FLUSH 100 UNIT/ML IV SOLN
500.0000 [IU] | Freq: Every day | INTRAVENOUS | Status: AC | PRN
  Administered 2013-10-17: 500 [IU]
  Filled 2013-10-17: qty 5

## 2013-10-17 MED ORDER — SODIUM CHLORIDE 0.9 % IV SOLN
250.0000 mL | Freq: Once | INTRAVENOUS | Status: AC
  Administered 2013-10-17: 250 mL via INTRAVENOUS

## 2013-10-17 MED ORDER — DIPHENHYDRAMINE HCL 25 MG PO CAPS
25.0000 mg | ORAL_CAPSULE | Freq: Once | ORAL | Status: AC
  Administered 2013-10-17: 25 mg via ORAL

## 2013-10-17 MED ORDER — HEPARIN SOD (PORK) LOCK FLUSH 100 UNIT/ML IV SOLN
500.0000 [IU] | Freq: Once | INTRAVENOUS | Status: DC | PRN
  Filled 2013-10-17: qty 5

## 2013-10-17 MED ORDER — IBUPROFEN 200 MG PO TABS
600.0000 mg | ORAL_TABLET | Freq: Once | ORAL | Status: AC
Start: 2013-10-17 — End: 2013-10-17
  Administered 2013-10-17: 600 mg via ORAL

## 2013-10-17 MED ORDER — DACARBAZINE 200 MG IV SOLR
375.0000 mg/m2 | Freq: Once | INTRAVENOUS | Status: AC
  Administered 2013-10-17: 600 mg via INTRAVENOUS
  Filled 2013-10-17: qty 30

## 2013-10-17 MED ORDER — SODIUM CHLORIDE 0.9 % IV SOLN
Freq: Once | INTRAVENOUS | Status: AC
  Administered 2013-10-17: 12:00:00 via INTRAVENOUS

## 2013-10-17 MED ORDER — DEXAMETHASONE SODIUM PHOSPHATE 20 MG/5ML IJ SOLN
INTRAMUSCULAR | Status: AC
Start: 2013-10-17 — End: 2013-10-17
  Filled 2013-10-17: qty 5

## 2013-10-17 NOTE — Patient Instructions (Signed)
Your hemoglobin is low and we will arrange to transfuse you a unit of blood to address this level of anemia Return on 10/18/2012 for your Neulasta injection Continue weekly labs as scheduled Followup with Dr. Alen Blew on 10/31/2013 prior to the start of her next scheduled cycle of chemotherapy

## 2013-10-17 NOTE — Progress Notes (Signed)
Ok to treat with lab values of 10/17/13 per [facility].  Pt to receive one unit of blood today.  Completed and faxed Medicaid verification of receipt.  Original returned to patient.   Blood return verified prior to Adriamycin. Blood return verified prior to Vinblastine.

## 2013-10-17 NOTE — Telephone Encounter (Signed)
gv adn printed appt sched and avs for March and April....sed added tx.Marland KitchenMarland KitchenMarland Kitchen

## 2013-10-17 NOTE — Progress Notes (Signed)
Hematology and Oncology Follow Up Visit  Thomas [name] [id number] [birth year] 33 y.o. 10/17/2013 12:04 PM   Principle Diagnosis:  33 year old HIV positive gentleman diagnosed with Hodgkin's disease in January of 2015 after he presented with anemia and diffuse lymphadenopathy.   Prior Therapy: He is status post supraclavicular right lymph node biopsy done on 08/02/2013.  Current therapy: Systemic chemotherapy in the form of ABVD to start on 10/03/2013.  Interim History:  [name] presents today for a followup visit accompanied by his mother. Overall is tolerating his chemotherapy without difficulty. He reports occasional subjective chills but has no documented fever. His appetite is fair however his mother is not does not feel as though he is eating enough. He does drink 3 Ensure nutritional supplements daily. His mother states she fixes some of the plate of food. I discussed at length having him eat several small meals throughout the day as it would be less overwhelming for him and he would be more likely to ingest more. The mother states that they have a significant transportation problems and she does not on a car. She did arrange transportation for him to come to the [facility] tomorrow (Saturday, 10/18/2013) to receive his injection. He denies fevers or chills or sweats. Denied pruritus or any constitutional symptoms. He is no longer reporting any lymphadenopathy. Denied hematochezia or melena or any bleeding problems. He continues to be quite fatigued and debilitated and has very limited performance status. Has not reported any recent hospitalizations or illnesses. His appetite remains poor bu t has gained about 1 pound since his last office visit.  Medications: I have reviewed the patient's current medications.  Current Outpatient Prescriptions  Medication Sig Dispense Refill  . atenolol (TENORMIN) 25 MG tablet Take 25 mg by mouth every morning.      Marland Kitchen efavirenz-emtricitabine-tenofovir  (ATRIPLA) 600-200-300 MG per tablet Take 1 tablet by mouth at bedtime.      . ferrous sulfate 325 (65 FE) MG tablet Take 1 tablet (325 mg total) by mouth 2 (two) times daily with a meal.  60 tablet  3  . lidocaine-prilocaine (EMLA) cream Apply topically once.  1 each  0  . prochlorperazine (COMPAZINE) 10 MG tablet Take 1 tablet (10 mg total) by mouth every 6 (six) hours as needed for nausea or vomiting.  30 tablet  0  . pantoprazole (PROTONIX) 40 MG tablet Take 1 tablet (40 mg total) by mouth 2 (two) times daily before a meal.  30 tablet  0   No current facility-administered medications for this visit.   Facility-Administered Medications Ordered in Other Visits  Medication Dose Route Frequency Provider Last Rate Last Dose  . bleomycin (BLEOCIN) 15 Units in sodium chloride 0.9 % 50 mL chemo infusion  9.5 Units/m2 (Treatment Plan Actual) Intravenous Once Wyatt Portela, MD      . dacarbazine (DTIC) 600 mg in sodium chloride 0.9 % 250 mL chemo infusion  375 mg/m2 (Treatment Plan Actual) Intravenous Once Wyatt Portela, MD      . DOXOrubicin (ADRIAMYCIN) chemo injection 40 mg  25 mg/m2 (Treatment Plan Actual) Intravenous Once Wyatt Portela, MD      . heparin lock flush 100 unit/mL  500 Units Intracatheter Once PRN Wyatt Portela, MD      . ondansetron (ZOFRAN) IVPB 16 mg  16 mg Intravenous Once Wyatt Portela, MD   16 mg at 10/17/13 1155  . sodium chloride 0.9 % injection 10 mL  10 mL Intracatheter PRN  Wyatt Portela, MD      . vinBLAStine (VELBAN) 10 mg in sodium chloride 0.9 % 50 mL chemo infusion  6.2 mg/m2 (Treatment Plan Actual) Intravenous Once Wyatt Portela, MD         Allergies:  Allergies  Allergen Reactions  . Tylenol [Acetaminophen]     sweats    Past Medical History, Surgical history, Social history, and Family History were reviewed and updated.  Review of Systems: Constitutional:  Negative for fever, chills, night sweats, anorexia, weight loss, pain. Cardiovascular: negative  for - chest pain, dyspnea on exertion or palpitations Respiratory: positive for - cough, hemoptysis and orthopnea Neurological: negative for - behavioral changes, dizziness or visual changes Dermatological: negative for acne, eczema and lumps ENT: negative for - headaches, sinus pain, sneezing or sore throat Skin: Negative. Gastrointestinal: negative for - abdominal pain, diarrhea or hematemesis Genito-Urinary: negative for - change in urinary stream, dysuria or hematuria Hematological and Lymphatic: negative for - bruising, jaundice or night sweats Breast: negative Musculoskeletal: negative for - joint swelling, muscle pain or muscular weakness Remaining ROS negative.  Physical Exam: Blood pressure 95/64, pulse 86, temperature 97.5 F (36.4 C), temperature source Oral, resp. rate 17, height 6' 1"  (1.854 m), weight 113 lb 8 oz (51.483 kg), SpO2 100.00%.  ECOG: 1  General appearance: alert, cooperative and appears stated age Head: Normocephalic, without obvious abnormality, atraumatic Neck: no adenopathy, no carotid bruit, no JVD, supple, symmetrical, trachea midline and thyroid not enlarged, symmetric, no tenderness/mass/nodules Lymph nodes: Cervical, supraclavicular, and axillary nodes normal. Heart:regular rate and rhythm, S1, S2 normal, no murmur, click, rub or gallop Lung:chest clear, no wheezing, rales, normal symmetric air entry Abdomin: soft, non-tender, without masses or organomegaly EXT:no erythema, induration, or nodules   Lab Results: Lab Results  Component Value Date   WBC 12.6* 10/17/2013   HGB 7.5* 10/17/2013   HCT 23.4* 10/17/2013   MCV 102.4* 10/17/2013   PLT 77* 10/17/2013     Chemistry      Component Value Date/Time   NA 143 10/17/2013 0944   NA 141 08/04/2013 0339   K 3.8 10/17/2013 0944   K 3.6* 08/04/2013 0339   CL 111 08/04/2013 0339   CO2 24 10/17/2013 0944   CO2 20 08/04/2013 0339   BUN 18.4 10/17/2013 0944   BUN 12 08/04/2013 0339   CREATININE 1.4*  10/17/2013 0944   CREATININE 0.71 08/04/2013 0339   CREATININE 0.85 06/17/2013 1658      Component Value Date/Time   CALCIUM 8.9 10/17/2013 0944   CALCIUM 7.5* 08/04/2013 0339   ALKPHOS 140 10/17/2013 0944   ALKPHOS 123* 08/02/2013 1620   AST 14 10/17/2013 0944   AST 17 08/02/2013 1620   ALT 10 10/17/2013 0944   ALT 7 08/02/2013 1620   BILITOT 0.24 10/17/2013 0944   BILITOT 0.3 08/02/2013 1620     Study Conclusions  - Left ventricle: The cavity size was normal. Wall thickness was normal. Systolic function was normal. The estimated ejection fraction was in the range of 55% to 60%. Wall motion was normal; there were no regional wall motion abnormalities. Left ventricular diastolic function parameters were normal. Logitudinal strain imaging values ranged from -14 to -20% which is normal. - Left atrium: The atrium was normal in size. - Atrial septum: No defect or patent foramen ovale was identified. - Pericardium, extracardiac: There was no pericardial effusion. Impressions:  - Normal study. Transthoracic echocardiography. M-mode, limited 2D, limited spectral Doppler, and color Doppler. Height: Height:  185.4cm. Height: 73in. Weight: Weight: 56.8kg. Weight: 125lb. Body mass index: BMI: 16.5kg/m^2. Body surface area: BSA: 1.61m2. Blood pressure: 87/60. Patient status: Outpatient. Location: Echo laboratory.   EXAM:  NUCLEAR MEDICINE PET SKULL BASE TO THIGH  FASTING BLOOD GLUCOSE: Value: 90 mg/dl  TECHNIQUE:  6.3 mCi F-18 FDG was injected intravenously. Full-ring PET imaging  was performed from the skull base to thigh after the radiotracer. CT  data was obtained and used for attenuation correction and anatomic  localization.  COMPARISON: CT CHEST W/CM dated 08/04/2013; CT ABD/PELVIS W CM dated  08/04/2013  FINDINGS:  NECK  Bilateral hypermetabolic level two lymph nodes with SUV max = 5.9.  CHEST  There is bulky hypermetabolic axial lymphadenopathy with SUV max  8.5. Example lymph  node measures 30 mm short axis (image 52,  series).  There is bulky hypermetabolic mediastinal hilar adenopathy with SUV  max 8.2. There is no pulmonary parenchymal lesions present.  ABDOMEN/PELVIS  The spleen is massively enlarged and intensely hypermetabolic  measuring 28 cm in craniocaudad dimension with SUV max 8.5  There is hypermetabolic periportal and retrocrural adenopathy. For  example retrocrural lymph node measures 18 mm short axis (image  104).  There is bilateral renal calculi. Moderate hydronephrosis and  hydroureter on the right. There is a partially obstructing calculus  in the proximal right ureter measuring 9 mm. There several distal  right ureteral calculi measuring 8 mm (image 183), 6 mm (image 186)  and 5 mm (image 192). These distal right ureteral calculi are  approximately 2 to 4 cm from the vesicoureteral junction. No  evidence of hydronephrosis or hydroureter on the left. There is a  calculus in the right left renal pelvis measuring 1 5 mm (image  113).  There is intensely hypermetabolic inguinal and iliac adenopathy.  Left external iliac lymph node measures 22 mm with SUV max 12 point  3.  Of note the liver activity is relatively low (SUV max 1.5).  SKELETON  There is diffuse intense hypermetabolic marrow activity throughout  the visualized skeleton with SUV max 10.7.  IMPRESSION:  1. Intense hypermetabolic adenopathy involving the neck, axilla,  mediastinum, retroperitoneum and iliac nodal stations. The activity  is significantly above liver ( [place] 5 )  2. Hypermetabolic massively enlarged spleen ( [place] 5)  3. Intensely hypermetabolic diffuse marrow activity ( [place]auville 5).  4. Bilateral ureteral calculi with increased obstruction on the  right. Recommend urology consultation.  Findings conveyed toFIRAS SHADAD's nurse Dixxie on 09/19/2013  at12:21.   Impression and Plan:  33year old gentleman with the following issues:  1. Hodgkin's  disease presented with lymphadenopathy above and below the diaphragm without any constitutional symptoms indicating at least stage IIIA. His PET scan and bone marrow biopsy results were discussed with the patient and his mother. He does have disease involvement above and below the diaphragm as well as bone marrow involvement his echocardiogram as well should normal EF. He is currently tolerating his chemotherapy without difficulty. Patient reviewed with Dr. SAlen Blew He will proceed with day 15 of cycle 1 of his systemic chemotherapy with ABVD today as scheduled. His mother will make every effort to get him here on 10/18/2013 for his Neulasta injection.  Chemotherapy -expecting to complete a total of 4-6 cycles of treatment    2. Anemia: This is undoubtedly related to his Hodgkin's disease. His hemoglobin of 7.5 g/dL. He will receive a single unit of blood today to address the chemotherapy-induced anemia.  3. IV access: Port-A-Cath has  already been inserted and is utilizing EMLA cream   4. Nausea prophylaxis: A prescription for anti-emetics will be called in today.  5. HIV: He is on Atripla and followed by ID.   6. thrombocytopenia: Likely related to his malignancy involvement of the bone marrow he has no bleeding at this time requires no transfusion.  7. Followup: Will be on 10/31/2013 for cycle 2 day 1 ABVD.  8. Neutropenic prophylaxis: To receive Neulasta after each chemotherapy treatment.   YALE, GOLLA, PA-C  3/27/201512:04 PM

## 2013-10-17 NOTE — Patient Instructions (Signed)
[facility] Discharge Instructions for Patients Receiving Chemotherapy  Today you received the following chemotherapy agents: adriamycin, vinblastine, bleomycin, dacarbazine.  To help prevent nausea and vomiting after your treatment, we encourage you to take your nausea medication.  Take it as often as prescribed.     If you develop nausea and vomiting that is not controlled by your nausea medication, call the clinic. If it is after clinic hours your family physician or the after hours number for the clinic or go to the Emergency Department.   BELOW ARE SYMPTOMS THAT SHOULD BE REPORTED IMMEDIATELY:  *FEVER GREATER THAN 100.5 F  *CHILLS WITH OR WITHOUT FEVER  NAUSEA AND VOMITING THAT IS NOT CONTROLLED WITH YOUR NAUSEA MEDICATION  *UNUSUAL SHORTNESS OF BREATH  *UNUSUAL BRUISING OR BLEEDING  TENDERNESS IN MOUTH AND THROAT WITH OR WITHOUT PRESENCE OF ULCERS  *URINARY PROBLEMS  *BOWEL PROBLEMS  UNUSUAL RASH Items with * indicate a potential emergency and should be followed up as soon as possible.  Feel free to call the clinic you have any questions or concerns. The clinic phone number is [phone].   I have been informed and understand all the instructions given to me. I know to contact the clinic, my physician, or go to the Emergency Department if any problems should occur. I do not have any questions at this time, but understand that I may call the clinic during office hours   should I have any questions or need assistance in obtaining follow up care.    __________________________________________  _____________  __________ Signature of Patient or Authorized Representative            Date                   Time    __________________________________________ Nurse's Signature   Blood Transfusion Information WHAT IS A BLOOD TRANSFUSION? A transfusion is the replacement of blood or some of its parts. Blood is made up of multiple cells which provide  different functions.  Red blood cells carry oxygen and are used for blood loss replacement.  White blood cells fight against infection.  Platelets control bleeding.  Plasma helps clot blood.  Other blood products are available for specialized needs, such as hemophilia or other clotting disorders. BEFORE THE TRANSFUSION  Who gives blood for transfusions?   You may be able to donate blood to be used at a later date on yourself (autologous donation).  Relatives can be asked to donate blood. This is generally not any safer than if you have received blood from a stranger. The same precautions are taken to ensure safety when a relative's blood is donated.  Healthy volunteers who are fully evaluated to make sure their blood is safe. This is blood bank blood. Transfusion therapy is the safest it has ever been in the practice of medicine. Before blood is taken from a donor, a complete history is taken to make sure that person has no history of diseases nor engages in risky social behavior (examples are intravenous drug use or sexual activity with multiple partners). The donor's travel history is screened to minimize risk of transmitting infections, such as malaria. The donated blood is tested for signs of infectious diseases, such as HIV and hepatitis. The blood is then tested to be sure it is compatible with you in order to minimize the chance of a transfusion reaction. If you or a relative donates blood, this is often done in anticipation of surgery and is not appropriate  for emergency situations. It takes many days to process the donated blood. RISKS AND COMPLICATIONS Although transfusion therapy is very safe and saves many lives, the main dangers of transfusion include:   Getting an infectious disease.  Developing a transfusion reaction. This is an allergic reaction to something in the blood you were given. Every precaution is taken to prevent this. The decision to have a blood transfusion has  been considered carefully by your caregiver before blood is given. Blood is not given unless the benefits outweigh the risks. AFTER THE TRANSFUSION  Right after receiving a blood transfusion, you will usually feel much better and more energetic. This is especially true if your red blood cells have gotten low (anemic). The transfusion raises the level of the red blood cells which carry oxygen, and this usually causes an energy increase.  The nurse administering the transfusion will monitor you carefully for complications. HOME CARE INSTRUCTIONS  No special instructions are needed after a transfusion. You may find your energy is better. Speak with your caregiver about any limitations on activity for underlying diseases you may have. SEEK MEDICAL CARE IF:   Your condition is not improving after your transfusion.  You develop redness or irritation at the intravenous (IV) site. SEEK IMMEDIATE MEDICAL CARE IF:  Any of the following symptoms occur over the next 12 hours:  Shaking chills.  You have a temperature by mouth above 102 F (38.9 C), not controlled by medicine.  Chest, back, or muscle pain.  People around you feel you are not acting correctly or are confused.  Shortness of breath or difficulty breathing.  Dizziness and fainting.  You get a rash or develop hives.  You have a decrease in urine output.  Your urine turns a dark color or changes to pink, red, or brown. Any of the following symptoms occur over the next 10 days:  You have a temperature by mouth above 102 F (38.9 C), not controlled by medicine.  Shortness of breath.  Weakness after normal activity.  The white part of the eye turns yellow (jaundice).  You have a decrease in the amount of urine or are urinating less often.  Your urine turns a dark color or changes to pink, red, or brown. Document Released: 07/07/2000 Document Revised: 10/02/2011 Document Reviewed: 02/24/2008 [facility] Patient Information  2014 [place].

## 2013-10-18 ENCOUNTER — Ambulatory Visit (HOSPITAL_BASED_OUTPATIENT_CLINIC_OR_DEPARTMENT_OTHER): Payer: Medicare Other

## 2013-10-18 VITALS — BP 93/53 | HR 95 | Temp 97.8°F

## 2013-10-18 DIAGNOSIS — N201 Calculus of ureter: Secondary | ICD-10-CM | POA: Diagnosis not present

## 2013-10-18 DIAGNOSIS — Z5189 Encounter for other specified aftercare: Secondary | ICD-10-CM

## 2013-10-18 DIAGNOSIS — B20 Human immunodeficiency virus [HIV] disease: Secondary | ICD-10-CM | POA: Diagnosis not present

## 2013-10-18 DIAGNOSIS — C8190 Hodgkin lymphoma, unspecified, unspecified site: Secondary | ICD-10-CM

## 2013-10-18 DIAGNOSIS — C8198 Hodgkin lymphoma, unspecified, lymph nodes of multiple sites: Secondary | ICD-10-CM | POA: Diagnosis not present

## 2013-10-18 DIAGNOSIS — L738 Other specified follicular disorders: Secondary | ICD-10-CM | POA: Diagnosis not present

## 2013-10-18 DIAGNOSIS — D72819 Decreased white blood cell count, unspecified: Secondary | ICD-10-CM | POA: Diagnosis not present

## 2013-10-18 DIAGNOSIS — D539 Nutritional anemia, unspecified: Secondary | ICD-10-CM | POA: Diagnosis not present

## 2013-10-18 DIAGNOSIS — R634 Abnormal weight loss: Secondary | ICD-10-CM | POA: Diagnosis not present

## 2013-10-18 DIAGNOSIS — R599 Enlarged lymph nodes, unspecified: Secondary | ICD-10-CM | POA: Diagnosis not present

## 2013-10-18 DIAGNOSIS — Z21 Asymptomatic human immunodeficiency virus [HIV] infection status: Secondary | ICD-10-CM | POA: Diagnosis not present

## 2013-10-18 LAB — TYPE AND SCREEN
ABO/RH(D): O POS
Antibody Screen: NEGATIVE
UNIT DIVISION: 0

## 2013-10-18 MED ORDER — PEGFILGRASTIM INJECTION 6 MG/0.6ML
6.0000 mg | Freq: Once | SUBCUTANEOUS | Status: AC
  Administered 2013-10-18: 6 mg via SUBCUTANEOUS

## 2013-10-18 NOTE — Patient Instructions (Signed)

## 2013-10-21 ENCOUNTER — Other Ambulatory Visit: Payer: Self-pay | Admitting: *Deleted

## 2013-10-21 DIAGNOSIS — D649 Anemia, unspecified: Secondary | ICD-10-CM

## 2013-10-21 DIAGNOSIS — D6481 Anemia due to antineoplastic chemotherapy: Secondary | ICD-10-CM

## 2013-10-21 DIAGNOSIS — T451X5A Adverse effect of antineoplastic and immunosuppressive drugs, initial encounter: Principal | ICD-10-CM

## 2013-10-21 LAB — PULMONARY FUNCTION TEST
DL/VA % PRED: 58 %
DL/VA: 2.73 ml/min/mmHg/L
DLCO cor % pred: 51 %
DLCO cor: 16.61 ml/min/mmHg
DLCO unc % pred: 51 %
DLCO unc: 16.61 ml/min/mmHg
FEF 25-75 PRE: 4 L/s
FEF2575-%Pred-Pre: 96 %
FEV1-%Pred-Pre: 104 %
FEV1-PRE: 3.98 L
FEV1FVC-%Pred-Pre: 97 %
FEV6-%PRED-PRE: 107 %
FEV6-Pre: 4.85 L
FEV6FVC-%PRED-PRE: 101 %
FVC-%Pred-Pre: 106 %
FVC-Pre: 4.85 L
PRE FEV1/FVC RATIO: 82 %
PRE FEV6/FVC RATIO: 100 %

## 2013-10-21 MED ORDER — PROCHLORPERAZINE MALEATE 10 MG PO TABS: 10.0000 mg | ORAL_TABLET | Freq: Four times a day (QID) | ORAL | Status: DC | PRN

## 2013-10-21 MED ORDER — LIDOCAINE-PRILOCAINE 2.5-2.5 % EX KIT: PACK | CUTANEOUS | Status: DC

## 2013-10-21 MED ORDER — FERROUS SULFATE 325 (65 FE) MG PO TABS: 325.0000 mg | ORAL_TABLET | Freq: Two times a day (BID) | ORAL | Status: DC

## 2013-10-27 ENCOUNTER — Telehealth: Payer: Self-pay | Admitting: Dietician

## 2013-10-27 NOTE — Telephone Encounter (Signed)
Brief Outpatient Oncology Nutrition Note  Patient has been identified to be at risk on malnutrition screen.  Wt Readings from Last 10 Encounters:  10/17/13 113 lb 8 oz (51.483 kg)  09/30/13 112 lb 9.6 oz (51.075 kg)  09/01/13 119 lb 8 oz (54.205 kg)  08/28/13 126 lb (57.153 kg)  08/26/13 124 lb (56.246 kg)  08/21/13 127 lb (57.607 kg)  08/03/13 106 lb 7.7 oz (48.3 kg)  08/03/13 106 lb 7.7 oz (48.3 kg)  07/08/13 120 lb (54.432 kg)  07/02/13 122 lb (55.339 kg)    Dx:  Hodgkin's lymphoma with hx of HIV and Severe protein calorie malnutrition.    Called patient due to low weight.  Spoke with patient's mother who reported that intake has improved and he continues to drink 3 Ensure daily.  He has a case worker who brings the Ensure free of charge.  Discussed tips to increase calorie and protein intake.  [place] RD last saw patient 09/01/13 and is available as needed.  Antonieta Iba, RD, LDN

## 2013-10-30 ENCOUNTER — Other Ambulatory Visit: Payer: Self-pay

## 2013-10-31 ENCOUNTER — Ambulatory Visit (HOSPITAL_BASED_OUTPATIENT_CLINIC_OR_DEPARTMENT_OTHER): Payer: Medicare Other

## 2013-10-31 ENCOUNTER — Telehealth: Payer: Self-pay | Admitting: Oncology

## 2013-10-31 ENCOUNTER — Encounter: Payer: Self-pay | Admitting: Oncology

## 2013-10-31 ENCOUNTER — Telehealth: Payer: Self-pay | Admitting: *Deleted

## 2013-10-31 ENCOUNTER — Ambulatory Visit (HOSPITAL_BASED_OUTPATIENT_CLINIC_OR_DEPARTMENT_OTHER): Payer: Medicare Other | Admitting: Oncology

## 2013-10-31 VITALS — BP 93/58 | HR 99 | Temp 97.4°F | Resp 19 | Ht 73.0 in | Wt 120.7 lb

## 2013-10-31 DIAGNOSIS — R5381 Other malaise: Secondary | ICD-10-CM | POA: Diagnosis not present

## 2013-10-31 DIAGNOSIS — C8190 Hodgkin lymphoma, unspecified, unspecified site: Secondary | ICD-10-CM

## 2013-10-31 DIAGNOSIS — C8198 Hodgkin lymphoma, unspecified, lymph nodes of multiple sites: Secondary | ICD-10-CM | POA: Diagnosis not present

## 2013-10-31 DIAGNOSIS — D649 Anemia, unspecified: Secondary | ICD-10-CM

## 2013-10-31 DIAGNOSIS — Z95828 Presence of other vascular implants and grafts: Secondary | ICD-10-CM

## 2013-10-31 DIAGNOSIS — R634 Abnormal weight loss: Secondary | ICD-10-CM | POA: Diagnosis not present

## 2013-10-31 DIAGNOSIS — D696 Thrombocytopenia, unspecified: Secondary | ICD-10-CM

## 2013-10-31 DIAGNOSIS — N201 Calculus of ureter: Secondary | ICD-10-CM | POA: Diagnosis not present

## 2013-10-31 DIAGNOSIS — R599 Enlarged lymph nodes, unspecified: Secondary | ICD-10-CM | POA: Diagnosis not present

## 2013-10-31 DIAGNOSIS — Z21 Asymptomatic human immunodeficiency virus [HIV] infection status: Secondary | ICD-10-CM | POA: Diagnosis not present

## 2013-10-31 DIAGNOSIS — B20 Human immunodeficiency virus [HIV] disease: Secondary | ICD-10-CM

## 2013-10-31 DIAGNOSIS — R5383 Other fatigue: Secondary | ICD-10-CM

## 2013-10-31 DIAGNOSIS — D539 Nutritional anemia, unspecified: Secondary | ICD-10-CM | POA: Diagnosis not present

## 2013-10-31 DIAGNOSIS — D509 Iron deficiency anemia, unspecified: Secondary | ICD-10-CM

## 2013-10-31 DIAGNOSIS — Z5111 Encounter for antineoplastic chemotherapy: Secondary | ICD-10-CM | POA: Diagnosis not present

## 2013-10-31 DIAGNOSIS — L738 Other specified follicular disorders: Secondary | ICD-10-CM | POA: Diagnosis not present

## 2013-10-31 DIAGNOSIS — D72819 Decreased white blood cell count, unspecified: Secondary | ICD-10-CM | POA: Diagnosis not present

## 2013-10-31 LAB — CBC WITH DIFFERENTIAL/PLATELET
BASO%: 0.7 % (ref 0.0–2.0)
BASOS ABS: 0.1 10*3/uL (ref 0.0–0.1)
EOS ABS: 0.1 10*3/uL (ref 0.0–0.5)
EOS%: 0.9 % (ref 0.0–7.0)
HEMATOCRIT: 23.6 % — AB (ref 38.4–49.9)
HEMOGLOBIN: 8 g/dL — AB (ref 13.0–17.1)
LYMPH#: 1.7 10*3/uL (ref 0.9–3.3)
LYMPH%: 21.7 % (ref 14.0–49.0)
MCH: 34.5 pg — ABNORMAL HIGH (ref 27.2–33.4)
MCHC: 33.8 g/dL (ref 32.0–36.0)
MCV: 101.9 fL — ABNORMAL HIGH (ref 79.3–98.0)
MONO#: 0.6 10*3/uL (ref 0.1–0.9)
MONO%: 7.2 % (ref 0.0–14.0)
NEUT#: 5.5 10*3/uL (ref 1.5–6.5)
NEUT%: 69.5 % (ref 39.0–75.0)
PLATELETS: 191 10*3/uL (ref 140–400)
RBC: 2.32 10*6/uL — ABNORMAL LOW (ref 4.20–5.82)
RDW: 26.9 % — ABNORMAL HIGH (ref 11.0–14.6)
WBC: 8 10*3/uL (ref 4.0–10.3)

## 2013-10-31 LAB — COMPREHENSIVE METABOLIC PANEL (CC13)
ALT: 6 U/L (ref 0–55)
AST: 15 U/L (ref 5–34)
Albumin: 2.9 g/dL — ABNORMAL LOW (ref 3.5–5.0)
Alkaline Phosphatase: 121 U/L (ref 40–150)
Anion Gap: 6 mEq/L (ref 3–11)
BUN: 19.6 mg/dL (ref 7.0–26.0)
CALCIUM: 8.9 mg/dL (ref 8.4–10.4)
CHLORIDE: 105 meq/L (ref 98–109)
CO2: 22 mEq/L (ref 22–29)
Creatinine: 1.1 mg/dL (ref 0.7–1.3)
GLUCOSE: 92 mg/dL (ref 70–140)
Potassium: 4 mEq/L (ref 3.5–5.1)
Sodium: 133 mEq/L — ABNORMAL LOW (ref 136–145)
Total Protein: 7.4 g/dL (ref 6.4–8.3)

## 2013-10-31 MED ORDER — SODIUM CHLORIDE 0.9 % IV SOLN
Freq: Once | INTRAVENOUS | Status: AC
  Administered 2013-10-31: 14:00:00 via INTRAVENOUS

## 2013-10-31 MED ORDER — ONDANSETRON 16 MG/50ML IVPB (CHCC)
INTRAVENOUS | Status: AC
  Filled 2013-10-31: qty 16

## 2013-10-31 MED ORDER — DEXAMETHASONE SODIUM PHOSPHATE 20 MG/5ML IJ SOLN
20.0000 mg | Freq: Once | INTRAMUSCULAR | Status: AC
  Administered 2013-10-31: 20 mg via INTRAVENOUS

## 2013-10-31 MED ORDER — SODIUM CHLORIDE 0.9 % IV SOLN
9.0000 [IU]/m2 | Freq: Once | INTRAVENOUS | Status: AC
  Administered 2013-10-31: 15 [IU] via INTRAVENOUS
  Filled 2013-10-31: qty 5

## 2013-10-31 MED ORDER — DOXORUBICIN HCL CHEMO IV INJECTION 2 MG/ML
25.0000 mg/m2 | Freq: Once | INTRAVENOUS | Status: AC
  Administered 2013-10-31: 40 mg via INTRAVENOUS
  Filled 2013-10-31: qty 20

## 2013-10-31 MED ORDER — VINBLASTINE SULFATE CHEMO INJECTION 1 MG/ML
6.2000 mg/m2 | Freq: Once | INTRAVENOUS | Status: AC
  Administered 2013-10-31: 10 mg via INTRAVENOUS
  Filled 2013-10-31: qty 10

## 2013-10-31 MED ORDER — SODIUM CHLORIDE 0.9 % IV SOLN
375.0000 mg/m2 | Freq: Once | INTRAVENOUS | Status: AC
  Administered 2013-10-31: 600 mg via INTRAVENOUS
  Filled 2013-10-31: qty 30

## 2013-10-31 MED ORDER — ONDANSETRON 16 MG/50ML IVPB (CHCC)
16.0000 mg | Freq: Once | INTRAVENOUS | Status: AC
  Administered 2013-10-31: 16 mg via INTRAVENOUS

## 2013-10-31 MED ORDER — SODIUM CHLORIDE 0.9 % IJ SOLN
10.0000 mL | INTRAMUSCULAR | Status: DC | PRN
  Filled 2013-10-31: qty 10

## 2013-10-31 MED ORDER — SODIUM CHLORIDE 0.9 % IJ SOLN
10.0000 mL | INTRAMUSCULAR | Status: DC | PRN
  Administered 2013-10-31 (×2): 10 mL via INTRAVENOUS
  Filled 2013-10-31: qty 10

## 2013-10-31 MED ORDER — DEXAMETHASONE SODIUM PHOSPHATE 20 MG/5ML IJ SOLN
INTRAMUSCULAR | Status: AC
  Filled 2013-10-31: qty 5

## 2013-10-31 MED ORDER — CARRINGTON MOISTURE BARRIER EX CREA: TOPICAL_CREAM | CUTANEOUS | Status: DC

## 2013-10-31 MED ORDER — HEPARIN SOD (PORK) LOCK FLUSH 100 UNIT/ML IV SOLN
500.0000 [IU] | Freq: Once | INTRAVENOUS | Status: AC | PRN
  Administered 2013-10-31: 500 [IU]
  Filled 2013-10-31: qty 5

## 2013-10-31 NOTE — Telephone Encounter (Signed)
Gave pt appt for lab,md and chemo for APril and May 2015

## 2013-10-31 NOTE — Progress Notes (Signed)
Faxed script for eucerin cream to [facility]. Faxed medicaid transportation verification of receipt to [place]. [phone]

## 2013-10-31 NOTE — Progress Notes (Signed)
Hematology and Oncology Follow Up Visit  Thomas [name] [id number] [birth year] 33 y.o. 10/31/2013 1:16 PM   Principle Diagnosis:  33 year old HIV positive gentleman diagnosed with Hodgkin's disease in January of 2015 after he presented with anemia and diffuse lymphadenopathy.   Prior Therapy: He is status post supraclavicular right lymph node biopsy done on 08/02/2013.  Current therapy: Systemic chemotherapy in the form of ABVD to start on 10/03/2013.  Interim History:  [name] presents today for a followup visit accompanied by his mother. Overall is tolerating his chemotherapy without Complications.  His appetite is improving rapidly and have gained 6-7 pound since last visit. He denies fevers or chills or sweats. Denied pruritus or any constitutional symptoms. He is no longer reporting any lymphadenopathy. Denied hematochezia or melena or any bleeding problems. He continues to be quite fatigued and debilitated and has very limited performance status. Has not reported any recent hospitalizations or illnesses. He is reporting increased skin dryness and slight pruritus and his back and legs.  Medications: I have reviewed the patient's current medications.  Current Outpatient Prescriptions  Medication Sig Dispense Refill  . atenolol (TENORMIN) 25 MG tablet Take 25 mg by mouth every morning.      Marland Kitchen efavirenz-emtricitabine-tenofovir (ATRIPLA) 600-200-300 MG per tablet Take 1 tablet by mouth at bedtime.      . ferrous sulfate 325 (65 FE) MG tablet Take 1 tablet (325 mg total) by mouth 2 (two) times daily with a meal.  60 tablet  5  . lidocaine-prilocaine (EMLA) cream APPLY TOPICALLY TO PORT AS DIRECTED.  1 each  0  . pantoprazole (PROTONIX) 40 MG tablet Take 1 tablet (40 mg total) by mouth 2 (two) times daily before a meal.  30 tablet  0  . prochlorperazine (COMPAZINE) 10 MG tablet Take 1 tablet (10 mg total) by mouth every 6 (six) hours as needed for nausea or vomiting.  30 tablet  0  . Skin  Protectants, Misc. (EUCERIN) cream Apply to Skin twice a day.  397 g  0   No current facility-administered medications for this visit.   Facility-Administered Medications Ordered in Other Visits  Medication Dose Route Frequency Provider Last Rate Last Dose  . sodium chloride 0.9 % injection 10 mL  10 mL Intravenous PRN Wyatt Portela, MD   10 mL at 10/31/13 1200     Allergies:  Allergies  Allergen Reactions  . Tylenol [Acetaminophen]     sweats    Past Medical History, Surgical history, Social history, and Family History were reviewed and updated.  Review of Systems: Constitutional:  Negative for fever, chills, night sweats, anorexia, weight loss, pain. Cardiovascular: negative for - chest pain, dyspnea on exertion or palpitations Respiratory: positive for - cough, hemoptysis and orthopnea Neurological: negative for - behavioral changes, dizziness or visual changes Dermatological: negative for acne, eczema and lumps ENT: negative for - headaches, sinus pain, sneezing or sore throat Skin: Negative. Gastrointestinal: negative for - abdominal pain, diarrhea or hematemesis Genito-Urinary: negative for - change in urinary stream, dysuria or hematuria Hematological and Lymphatic: negative for - bruising, jaundice or night sweats Breast: negative Musculoskeletal: negative for - joint swelling, muscle pain or muscular weakness Remaining ROS negative.  Physical Exam: Blood pressure 93/58, pulse 99, temperature 97.4 F (36.3 C), temperature source Oral, resp. rate 19, height 6\' 1"  (1.854 m), weight 120 lb 11.2 oz (54.749 kg).  ECOG: 1  General appearance: alert, cooperative and appears stated age Head: Normocephalic, without obvious abnormality, atraumatic Neck: no  adenopathy, no carotid bruit, no JVD, supple, symmetrical, trachea midline and thyroid not enlarged, symmetric, no tenderness/mass/nodules Lymph nodes: Cervical, supraclavicular, and axillary nodes normal. Heart:regular  rate and rhythm, S1, S2 normal, no murmur, click, rub or gallop Lung:chest clear, no wheezing, rales, normal symmetric air entry Abdomin: soft, non-tender, without masses or organomegaly EXT:no erythema, induration, or nodules Skin: Dry skin noted on his upper abdomen and lower extremities but no rashes or lesions.   Lab Results: Lab Results  Component Value Date   WBC 8.0 10/31/2013   HGB 8.0* 10/31/2013   HCT 23.6* 10/31/2013   MCV 101.9* 10/31/2013   PLT 191 10/31/2013     Chemistry      Component Value Date/Time   NA 133* 10/31/2013 1156   NA 141 08/04/2013 0339   K 4.0 10/31/2013 1156   K 3.6* 08/04/2013 0339   CL 111 08/04/2013 0339   CO2 22 10/31/2013 1156   CO2 20 08/04/2013 0339   BUN 19.6 10/31/2013 1156   BUN 12 08/04/2013 0339   CREATININE 1.1 10/31/2013 1156   CREATININE 0.71 08/04/2013 0339   CREATININE 0.85 06/17/2013 1658      Component Value Date/Time   CALCIUM 8.9 10/31/2013 1156   CALCIUM 7.5* 08/04/2013 0339   ALKPHOS 121 10/31/2013 1156   ALKPHOS 123* 08/02/2013 1620   AST 15 10/31/2013 1156   AST 17 08/02/2013 1620   ALT 6 10/31/2013 1156   ALT 7 08/02/2013 1620   BILITOT <0.20 10/31/2013 1156   BILITOT 0.3 08/02/2013 1620      Impression and Plan:  33 year old gentleman with the following issues:  1. Hodgkin's disease presented with lymphadenopathy above and below the diaphragm without any constitutional symptoms indicating at least stage IIIA. He is ready to proceed with cycle 2 day 1 without any treatment delays.   2. Anemia: This is undoubtedly related to his Hodgkin's disease. His hemoglobin is improving with the treatment which is expected at this time. He does not require any blood transfusions at this time.  3. IV access: Port-A-Cath has already been inserted and is utilizing EMLA cream   4. Nausea prophylaxis: A prescription for anti-emetics is available for him to take.  5. HIV: He is on Atripla and followed by ID.   6. 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 His blood counts are recovering nicely.  7. Followup: Will be on 11/14/2013 for cycle 2 day 8 ABVD.  8. Neutropenic prophylaxis: To receive Neulasta after each chemotherapy treatment.  9. Skin dryness: I've prescribed to him Eucerin cream to apply to affected area as needed.   Wyatt Portela, MD 4/10/20151:16 PM

## 2013-10-31 NOTE — Patient Instructions (Signed)
[facility] Discharge Instructions for Patients Receiving Chemotherapy  Today you received the following chemotherapy agents Adriamycin/Velban/Bleomycin/DTIC To help prevent nausea and vomiting after your treatment, we encourage you to take your nausea medication as prescribed.   If you develop nausea and vomiting that is not controlled by your nausea medication, call the clinic.   BELOW ARE SYMPTOMS THAT SHOULD BE REPORTED IMMEDIATELY:  *FEVER GREATER THAN 100.5 F  *CHILLS WITH OR WITHOUT FEVER  NAUSEA AND VOMITING THAT IS NOT CONTROLLED WITH YOUR NAUSEA MEDICATION  *UNUSUAL SHORTNESS OF BREATH  *UNUSUAL BRUISING OR BLEEDING  TENDERNESS IN MOUTH AND THROAT WITH OR WITHOUT PRESENCE OF ULCERS  *URINARY PROBLEMS  *BOWEL PROBLEMS  UNUSUAL RASH Items with * indicate a potential emergency and should be followed up as soon as possible.  Feel free to call the clinic you have any questions or concerns. The clinic phone number is [phone].    

## 2013-10-31 NOTE — Telephone Encounter (Signed)
Per staff message and POF I have scheduled appts.  JMW  

## 2013-10-31 NOTE — Progress Notes (Signed)
Ok to treat with hgb 8.0 per Dr. Alen Blew.

## 2013-10-31 NOTE — Patient Instructions (Signed)

## 2013-11-01 ENCOUNTER — Ambulatory Visit (HOSPITAL_BASED_OUTPATIENT_CLINIC_OR_DEPARTMENT_OTHER): Payer: Medicare Other

## 2013-11-01 VITALS — BP 89/47 | HR 100 | Temp 98.0°F | Resp 20

## 2013-11-01 DIAGNOSIS — Z5189 Encounter for other specified aftercare: Secondary | ICD-10-CM | POA: Diagnosis not present

## 2013-11-01 DIAGNOSIS — C8198 Hodgkin lymphoma, unspecified, lymph nodes of multiple sites: Secondary | ICD-10-CM

## 2013-11-01 MED ORDER — PEGFILGRASTIM INJECTION 6 MG/0.6ML
6.0000 mg | Freq: Once | SUBCUTANEOUS | Status: AC
  Administered 2013-11-01: 6 mg via SUBCUTANEOUS

## 2013-11-01 NOTE — Patient Instructions (Signed)

## 2013-11-04 ENCOUNTER — Ambulatory Visit: Payer: Medicare Other | Admitting: Internal Medicine

## 2013-11-13 ENCOUNTER — Other Ambulatory Visit: Payer: Self-pay | Admitting: Oncology

## 2013-11-13 DIAGNOSIS — C8190 Hodgkin lymphoma, unspecified, unspecified site: Secondary | ICD-10-CM

## 2013-11-14 ENCOUNTER — Ambulatory Visit: Payer: Medicare Other

## 2013-11-14 ENCOUNTER — Other Ambulatory Visit: Payer: Medicare Other

## 2013-11-15 ENCOUNTER — Ambulatory Visit: Payer: Medicare Other

## 2013-11-17 ENCOUNTER — Telehealth: Payer: Self-pay | Admitting: *Deleted

## 2013-11-17 NOTE — Telephone Encounter (Signed)
Patient's mother called and rescheduled his missed appts. I have rescheduled his appts and called her with new appts.  JMW

## 2013-11-17 NOTE — Telephone Encounter (Signed)
Left message for desk RN that we will need to move appts.

## 2013-11-18 ENCOUNTER — Other Ambulatory Visit: Payer: Self-pay | Admitting: Medical Oncology

## 2013-11-18 ENCOUNTER — Encounter: Payer: Self-pay | Admitting: Pharmacist

## 2013-11-18 NOTE — Progress Notes (Signed)
Patient with r/s appt for lab and treatment on 04/29 d/t missed tx appt on 04/24. MD informed.  Per MD, POF sent to scheduling for future appts to be adjusted d/t r/s missed appts.

## 2013-11-19 ENCOUNTER — Other Ambulatory Visit (HOSPITAL_BASED_OUTPATIENT_CLINIC_OR_DEPARTMENT_OTHER): Payer: Medicare Other

## 2013-11-19 ENCOUNTER — Other Ambulatory Visit: Payer: Self-pay | Admitting: Oncology

## 2013-11-19 ENCOUNTER — Ambulatory Visit (HOSPITAL_BASED_OUTPATIENT_CLINIC_OR_DEPARTMENT_OTHER): Payer: Medicare Other

## 2013-11-19 ENCOUNTER — Encounter: Payer: Self-pay | Admitting: *Deleted

## 2013-11-19 VITALS — BP 97/65 | HR 86 | Temp 97.7°F | Resp 18

## 2013-11-19 DIAGNOSIS — Z21 Asymptomatic human immunodeficiency virus [HIV] infection status: Secondary | ICD-10-CM | POA: Diagnosis not present

## 2013-11-19 DIAGNOSIS — L738 Other specified follicular disorders: Secondary | ICD-10-CM | POA: Diagnosis not present

## 2013-11-19 DIAGNOSIS — R599 Enlarged lymph nodes, unspecified: Secondary | ICD-10-CM | POA: Diagnosis not present

## 2013-11-19 DIAGNOSIS — N201 Calculus of ureter: Secondary | ICD-10-CM | POA: Diagnosis not present

## 2013-11-19 DIAGNOSIS — Z5111 Encounter for antineoplastic chemotherapy: Secondary | ICD-10-CM | POA: Diagnosis not present

## 2013-11-19 DIAGNOSIS — C8198 Hodgkin lymphoma, unspecified, lymph nodes of multiple sites: Secondary | ICD-10-CM

## 2013-11-19 DIAGNOSIS — R634 Abnormal weight loss: Secondary | ICD-10-CM | POA: Diagnosis not present

## 2013-11-19 DIAGNOSIS — D539 Nutritional anemia, unspecified: Secondary | ICD-10-CM | POA: Diagnosis not present

## 2013-11-19 DIAGNOSIS — D72819 Decreased white blood cell count, unspecified: Secondary | ICD-10-CM | POA: Diagnosis not present

## 2013-11-19 DIAGNOSIS — B20 Human immunodeficiency virus [HIV] disease: Secondary | ICD-10-CM | POA: Diagnosis not present

## 2013-11-19 DIAGNOSIS — C8190 Hodgkin lymphoma, unspecified, unspecified site: Secondary | ICD-10-CM

## 2013-11-19 LAB — COMPREHENSIVE METABOLIC PANEL (CC13)
ALT: 10 U/L (ref 0–55)
ANION GAP: 9 meq/L (ref 3–11)
AST: 15 U/L (ref 5–34)
Albumin: 3 g/dL — ABNORMAL LOW (ref 3.5–5.0)
Alkaline Phosphatase: 141 U/L (ref 40–150)
BUN: 26.5 mg/dL — ABNORMAL HIGH (ref 7.0–26.0)
CO2: 21 meq/L — AB (ref 22–29)
CREATININE: 1.6 mg/dL — AB (ref 0.7–1.3)
Calcium: 9.9 mg/dL (ref 8.4–10.4)
Chloride: 104 mEq/L (ref 98–109)
Glucose: 83 mg/dl (ref 70–140)
Potassium: 3.8 mEq/L (ref 3.5–5.1)
Sodium: 134 mEq/L — ABNORMAL LOW (ref 136–145)
Total Bilirubin: 0.23 mg/dL (ref 0.20–1.20)
Total Protein: 8.1 g/dL (ref 6.4–8.3)

## 2013-11-19 LAB — CBC WITH DIFFERENTIAL/PLATELET
BASO%: 0.5 % (ref 0.0–2.0)
BASOS ABS: 0 10*3/uL (ref 0.0–0.1)
EOS%: 1 % (ref 0.0–7.0)
Eosinophils Absolute: 0 10*3/uL (ref 0.0–0.5)
HEMATOCRIT: 25.8 % — AB (ref 38.4–49.9)
HEMOGLOBIN: 8.8 g/dL — AB (ref 13.0–17.1)
LYMPH%: 26.9 % (ref 14.0–49.0)
MCH: 34 pg — AB (ref 27.2–33.4)
MCHC: 34.1 g/dL (ref 32.0–36.0)
MCV: 99.6 fL — ABNORMAL HIGH (ref 79.3–98.0)
MONO#: 0.4 10*3/uL (ref 0.1–0.9)
MONO%: 10.1 % (ref 0.0–14.0)
NEUT#: 2.5 10*3/uL (ref 1.5–6.5)
NEUT%: 61.5 % (ref 39.0–75.0)
PLATELETS: 207 10*3/uL (ref 140–400)
RBC: 2.59 10*6/uL — ABNORMAL LOW (ref 4.20–5.82)
RDW: 19.6 % — ABNORMAL HIGH (ref 11.0–14.6)
WBC: 4 10*3/uL (ref 4.0–10.3)
lymph#: 1.1 10*3/uL (ref 0.9–3.3)

## 2013-11-19 MED ORDER — DEXAMETHASONE SODIUM PHOSPHATE 20 MG/5ML IJ SOLN
20.0000 mg | Freq: Once | INTRAMUSCULAR | Status: AC
  Administered 2013-11-19: 20 mg via INTRAVENOUS

## 2013-11-19 MED ORDER — VINBLASTINE SULFATE CHEMO INJECTION 1 MG/ML
10.0000 mg | Freq: Once | INTRAVENOUS | Status: AC
  Administered 2013-11-19: 10 mg via INTRAVENOUS
  Filled 2013-11-19: qty 10

## 2013-11-19 MED ORDER — SODIUM CHLORIDE 0.9 % IJ SOLN
10.0000 mL | INTRAMUSCULAR | Status: DC | PRN
  Administered 2013-11-19: 10 mL
  Filled 2013-11-19: qty 10

## 2013-11-19 MED ORDER — DEXAMETHASONE SODIUM PHOSPHATE 20 MG/5ML IJ SOLN
INTRAMUSCULAR | Status: AC
  Filled 2013-11-19: qty 5

## 2013-11-19 MED ORDER — SODIUM CHLORIDE 0.9 % IV SOLN
Freq: Once | INTRAVENOUS | Status: AC
  Administered 2013-11-19: 14:00:00 via INTRAVENOUS

## 2013-11-19 MED ORDER — SODIUM CHLORIDE 0.9 % IV SOLN
375.0000 mg/m2 | Freq: Once | INTRAVENOUS | Status: AC
  Administered 2013-11-19: 600 mg via INTRAVENOUS
  Filled 2013-11-19: qty 30

## 2013-11-19 MED ORDER — ONDANSETRON 16 MG/50ML IVPB (CHCC)
INTRAVENOUS | Status: AC
  Filled 2013-11-19: qty 16

## 2013-11-19 MED ORDER — BLEOMYCIN SULFATE CHEMO INJECTION 30 UNIT
15.0000 [IU] | Freq: Once | INTRAMUSCULAR | Status: AC
  Administered 2013-11-19: 15 [IU] via INTRAVENOUS
  Filled 2013-11-19: qty 5

## 2013-11-19 MED ORDER — HEPARIN SOD (PORK) LOCK FLUSH 100 UNIT/ML IV SOLN
500.0000 [IU] | Freq: Once | INTRAVENOUS | Status: AC | PRN
  Administered 2013-11-19: 500 [IU]
  Filled 2013-11-19: qty 5

## 2013-11-19 MED ORDER — DOXORUBICIN HCL CHEMO IV INJECTION 2 MG/ML
25.0000 mg/m2 | Freq: Once | INTRAVENOUS | Status: AC
  Administered 2013-11-19: 40 mg via INTRAVENOUS
  Filled 2013-11-19: qty 20

## 2013-11-19 MED ORDER — ONDANSETRON 16 MG/50ML IVPB (CHCC)
16.0000 mg | Freq: Once | INTRAVENOUS | Status: AC
  Administered 2013-11-19: 16 mg via INTRAVENOUS

## 2013-11-19 NOTE — Patient Instructions (Signed)
[facility] Discharge Instructions for Patients Receiving Chemotherapy  Today you received the following chemotherapy agents: adriamycin, vinblastine, bleomycin, dacarbazine  To help prevent nausea and vomiting after your treatment, we encourage you to take your nausea medication.  Take it as often as prescribed.     If you develop nausea and vomiting that is not controlled by your nausea medication, call the clinic. If it is after clinic hours your family physician or the after hours number for the clinic or go to the Emergency Department.   BELOW ARE SYMPTOMS THAT SHOULD BE REPORTED IMMEDIATELY:  *FEVER GREATER THAN 100.5 F  *CHILLS WITH OR WITHOUT FEVER  NAUSEA AND VOMITING THAT IS NOT CONTROLLED WITH YOUR NAUSEA MEDICATION  *UNUSUAL SHORTNESS OF BREATH  *UNUSUAL BRUISING OR BLEEDING  TENDERNESS IN MOUTH AND THROAT WITH OR WITHOUT PRESENCE OF ULCERS  *URINARY PROBLEMS  *BOWEL PROBLEMS  UNUSUAL RASH Items with * indicate a potential emergency and should be followed up as soon as possible.  Feel free to call the clinic you have any questions or concerns. The clinic phone number is [phone].   I have been informed and understand all the instructions given to me. I know to contact the clinic, my physician, or go to the Emergency Department if any problems should occur. I do not have any questions at this time, but understand that I may call the clinic during office hours   should I have any questions or need assistance in obtaining follow up care.    __________________________________________  _____________  __________ Signature of Patient or Authorized Representative            Date                   Time    __________________________________________ Nurse's Signature    

## 2013-11-19 NOTE — Progress Notes (Signed)
Ok to treat with Cr 1.6 per Dr. Shadad 

## 2013-11-20 ENCOUNTER — Encounter: Payer: Self-pay | Admitting: Internal Medicine

## 2013-11-20 ENCOUNTER — Other Ambulatory Visit: Payer: Self-pay | Admitting: *Deleted

## 2013-11-20 ENCOUNTER — Ambulatory Visit (INDEPENDENT_AMBULATORY_CARE_PROVIDER_SITE_OTHER): Payer: Medicare Other | Admitting: Internal Medicine

## 2013-11-20 ENCOUNTER — Ambulatory Visit (HOSPITAL_BASED_OUTPATIENT_CLINIC_OR_DEPARTMENT_OTHER): Payer: Medicare Other

## 2013-11-20 VITALS — BP 94/60 | HR 105 | Temp 98.6°F | Ht 73.0 in | Wt 124.0 lb

## 2013-11-20 VITALS — BP 84/53 | HR 101 | Temp 98.1°F

## 2013-11-20 DIAGNOSIS — B20 Human immunodeficiency virus [HIV] disease: Secondary | ICD-10-CM | POA: Diagnosis not present

## 2013-11-20 DIAGNOSIS — Z5189 Encounter for other specified aftercare: Secondary | ICD-10-CM

## 2013-11-20 DIAGNOSIS — C8190 Hodgkin lymphoma, unspecified, unspecified site: Secondary | ICD-10-CM

## 2013-11-20 DIAGNOSIS — C8198 Hodgkin lymphoma, unspecified, lymph nodes of multiple sites: Secondary | ICD-10-CM | POA: Diagnosis not present

## 2013-11-20 DIAGNOSIS — D6481 Anemia due to antineoplastic chemotherapy: Secondary | ICD-10-CM

## 2013-11-20 DIAGNOSIS — E43 Unspecified severe protein-calorie malnutrition: Secondary | ICD-10-CM | POA: Diagnosis not present

## 2013-11-20 DIAGNOSIS — T451X5A Adverse effect of antineoplastic and immunosuppressive drugs, initial encounter: Principal | ICD-10-CM

## 2013-11-20 MED ORDER — PROCHLORPERAZINE MALEATE 10 MG PO TABS: 10.0000 mg | ORAL_TABLET | Freq: Four times a day (QID) | ORAL | Status: DC | PRN

## 2013-11-20 MED ORDER — PEGFILGRASTIM INJECTION 6 MG/0.6ML
6.0000 mg | Freq: Once | SUBCUTANEOUS | Status: AC
  Administered 2013-11-20: 6 mg via SUBCUTANEOUS
  Filled 2013-11-20: qty 0.6

## 2013-11-20 NOTE — Patient Instructions (Signed)

## 2013-11-20 NOTE — Progress Notes (Signed)
   Subjective:    Patient ID: DOIL KAMARA, male    DOB: Jun 25, 1981, 33 y.o.   MRN: [id number]  HPI  Here for follow up of HIV.  Events of last few months include hospitalization, diagnosis of lymphoma.  Patient followed by Dr. Doylene Canard and getting chemo for Hodgkins lymphoma.    Some weight gain of about 4 lbs.  Pleased with the progress of chemo and is feeling better overall.     Review of Systems  Constitutional: Positive for activity change and fatigue. Negative for fever.  Respiratory: Negative for cough and shortness of breath.   Cardiovascular: Negative for chest pain.  Gastrointestinal: Negative for nausea and diarrhea.          Musculoskeletal: Negative for joint swelling.  Skin: Positive for color change. Negative for rash.       Fingers are darkening a bit       Objective:   Physical Exam  Constitutional:  Thin, chronically ill appearing  Eyes: Right eye exhibits no discharge. Left eye exhibits no discharge. No scleral icterus.  Cardiovascular: Normal rate, regular rhythm and normal heart sounds.   No murmur heard. Pulmonary/Chest: Effort normal and breath sounds normal. No respiratory distress. He has no wheezes.  Lymphadenopathy:    He has no cervical adenopathy.  Skin: No rash noted.  Some hyperpigmentation of fingers patient reports is related to chemo          Assessment & Plan:

## 2013-11-20 NOTE — Assessment & Plan Note (Addendum)
Will check labs today.   RTC 3 months.

## 2013-11-20 NOTE — Assessment & Plan Note (Signed)
Encouraged continued eating.

## 2013-11-21 LAB — HIV-1 RNA QUANT-NO REFLEX-BLD

## 2013-11-21 LAB — T-HELPER CELL (CD4) - (RCID CLINIC ONLY)
CD4 % Helper T Cell: 34 % (ref 33–55)
CD4 T Cell Abs: 360 /uL — ABNORMAL LOW (ref 400–2700)

## 2013-11-27 ENCOUNTER — Telehealth: Payer: Self-pay | Admitting: Medical Oncology

## 2013-11-27 NOTE — Telephone Encounter (Signed)
Patient with appt tomorrow for labs/tx, per pharmacy too soon since pt received tx only ten days ago, due to pt missed a sched appt tx schedule is off. Reviewed with MD, per MD patient to be r/s for labs/tx/ and injection next week.  Patient informed not to come to tomorrow's appt's because he just received tx ten days ago and to expect call from scheduling for new appts for next week. Patient expressed understanding.    Per MD, POF sent for adjustment to pt/'s sched.

## 2013-11-28 ENCOUNTER — Ambulatory Visit: Payer: Medicare Other | Admitting: Physician Assistant

## 2013-11-28 ENCOUNTER — Ambulatory Visit: Payer: Medicare Other

## 2013-11-28 ENCOUNTER — Other Ambulatory Visit: Payer: Medicare Other

## 2013-11-28 ENCOUNTER — Telehealth: Payer: Self-pay | Admitting: *Deleted

## 2013-11-28 NOTE — Telephone Encounter (Signed)
Per staff message and POF I have scheduled appts.  JMW  

## 2013-11-29 ENCOUNTER — Ambulatory Visit: Payer: Medicare Other

## 2013-12-01 ENCOUNTER — Telehealth: Payer: Self-pay | Admitting: Oncology

## 2013-12-01 NOTE — Telephone Encounter (Signed)
s/w pt mom re appts for 5/15 and 5/16. mom aware remaining appts to be adjusted if needed after f/u on 5/15.

## 2013-12-05 ENCOUNTER — Telehealth: Payer: Self-pay | Admitting: Oncology

## 2013-12-05 ENCOUNTER — Ambulatory Visit (HOSPITAL_BASED_OUTPATIENT_CLINIC_OR_DEPARTMENT_OTHER): Payer: Medicare Other | Admitting: Physician Assistant

## 2013-12-05 ENCOUNTER — Other Ambulatory Visit (HOSPITAL_BASED_OUTPATIENT_CLINIC_OR_DEPARTMENT_OTHER): Payer: Medicare Other

## 2013-12-05 ENCOUNTER — Other Ambulatory Visit: Payer: Self-pay

## 2013-12-05 ENCOUNTER — Ambulatory Visit (HOSPITAL_BASED_OUTPATIENT_CLINIC_OR_DEPARTMENT_OTHER): Payer: Medicare Other

## 2013-12-05 ENCOUNTER — Encounter: Payer: Self-pay | Admitting: Physician Assistant

## 2013-12-05 VITALS — BP 100/68 | HR 84 | Temp 97.8°F | Resp 20 | Ht 73.0 in | Wt 129.2 lb

## 2013-12-05 DIAGNOSIS — B20 Human immunodeficiency virus [HIV] disease: Secondary | ICD-10-CM | POA: Diagnosis not present

## 2013-12-05 DIAGNOSIS — L988 Other specified disorders of the skin and subcutaneous tissue: Secondary | ICD-10-CM

## 2013-12-05 DIAGNOSIS — D630 Anemia in neoplastic disease: Secondary | ICD-10-CM | POA: Diagnosis not present

## 2013-12-05 DIAGNOSIS — N201 Calculus of ureter: Secondary | ICD-10-CM | POA: Diagnosis not present

## 2013-12-05 DIAGNOSIS — Z21 Asymptomatic human immunodeficiency virus [HIV] infection status: Secondary | ICD-10-CM | POA: Diagnosis not present

## 2013-12-05 DIAGNOSIS — D72819 Decreased white blood cell count, unspecified: Secondary | ICD-10-CM | POA: Diagnosis not present

## 2013-12-05 DIAGNOSIS — D696 Thrombocytopenia, unspecified: Secondary | ICD-10-CM

## 2013-12-05 DIAGNOSIS — D539 Nutritional anemia, unspecified: Secondary | ICD-10-CM | POA: Diagnosis not present

## 2013-12-05 DIAGNOSIS — L738 Other specified follicular disorders: Secondary | ICD-10-CM | POA: Diagnosis not present

## 2013-12-05 DIAGNOSIS — R599 Enlarged lymph nodes, unspecified: Secondary | ICD-10-CM | POA: Diagnosis not present

## 2013-12-05 DIAGNOSIS — C8198 Hodgkin lymphoma, unspecified, lymph nodes of multiple sites: Secondary | ICD-10-CM | POA: Diagnosis not present

## 2013-12-05 DIAGNOSIS — Z5111 Encounter for antineoplastic chemotherapy: Secondary | ICD-10-CM

## 2013-12-05 DIAGNOSIS — C8190 Hodgkin lymphoma, unspecified, unspecified site: Secondary | ICD-10-CM

## 2013-12-05 DIAGNOSIS — R634 Abnormal weight loss: Secondary | ICD-10-CM | POA: Diagnosis not present

## 2013-12-05 DIAGNOSIS — D509 Iron deficiency anemia, unspecified: Secondary | ICD-10-CM

## 2013-12-05 LAB — COMPREHENSIVE METABOLIC PANEL (CC13)
ALK PHOS: 130 U/L (ref 40–150)
ALT: 14 U/L (ref 0–55)
ANION GAP: 9 meq/L (ref 3–11)
AST: 15 U/L (ref 5–34)
Albumin: 3.2 g/dL — ABNORMAL LOW (ref 3.5–5.0)
BUN: 27.5 mg/dL — ABNORMAL HIGH (ref 7.0–26.0)
CO2: 23 meq/L (ref 22–29)
CREATININE: 1.6 mg/dL — AB (ref 0.7–1.3)
Calcium: 9.8 mg/dL (ref 8.4–10.4)
Chloride: 103 mEq/L (ref 98–109)
GLUCOSE: 85 mg/dL (ref 70–140)
Potassium: 4 mEq/L (ref 3.5–5.1)
SODIUM: 135 meq/L — AB (ref 136–145)
Total Protein: 7.8 g/dL (ref 6.4–8.3)

## 2013-12-05 LAB — CBC WITH DIFFERENTIAL/PLATELET
BASO%: 0.3 % (ref 0.0–2.0)
Basophils Absolute: 0 10*3/uL (ref 0.0–0.1)
EOS ABS: 0.1 10*3/uL (ref 0.0–0.5)
EOS%: 1.7 % (ref 0.0–7.0)
HCT: 31.1 % — ABNORMAL LOW (ref 38.4–49.9)
HGB: 10 g/dL — ABNORMAL LOW (ref 13.0–17.1)
LYMPH%: 21.2 % (ref 14.0–49.0)
MCH: 33.4 pg (ref 27.2–33.4)
MCHC: 32.2 g/dL (ref 32.0–36.0)
MCV: 104 fL — AB (ref 79.3–98.0)
MONO#: 0.4 10*3/uL (ref 0.1–0.9)
MONO%: 7.2 % (ref 0.0–14.0)
NEUT%: 69.6 % (ref 39.0–75.0)
NEUTROS ABS: 4.1 10*3/uL (ref 1.5–6.5)
Platelets: 168 10*3/uL (ref 140–400)
RBC: 2.99 10*6/uL — AB (ref 4.20–5.82)
RDW: 17.4 % — ABNORMAL HIGH (ref 11.0–14.6)
WBC: 5.9 10*3/uL (ref 4.0–10.3)
lymph#: 1.2 10*3/uL (ref 0.9–3.3)

## 2013-12-05 MED ORDER — BLEOMYCIN SULFATE CHEMO INJECTION 30 UNIT
16.0000 [IU] | Freq: Once | INTRAMUSCULAR | Status: AC
  Administered 2013-12-05: 16 [IU] via INTRAVENOUS
  Filled 2013-12-05: qty 5.33

## 2013-12-05 MED ORDER — ONDANSETRON 16 MG/50ML IVPB (CHCC)
16.0000 mg | Freq: Once | INTRAVENOUS | Status: AC
  Administered 2013-12-05: 16 mg via INTRAVENOUS

## 2013-12-05 MED ORDER — DEXAMETHASONE SODIUM PHOSPHATE 20 MG/5ML IJ SOLN
INTRAMUSCULAR | Status: AC
  Filled 2013-12-05: qty 5

## 2013-12-05 MED ORDER — VINBLASTINE SULFATE CHEMO INJECTION 1 MG/ML
10.0000 mg | Freq: Once | INTRAVENOUS | Status: AC
  Administered 2013-12-05: 10 mg via INTRAVENOUS
  Filled 2013-12-05: qty 10

## 2013-12-05 MED ORDER — SODIUM CHLORIDE 0.9 % IJ SOLN
10.0000 mL | INTRAMUSCULAR | Status: DC | PRN
  Administered 2013-12-05: 10 mL
  Filled 2013-12-05: qty 10

## 2013-12-05 MED ORDER — ONDANSETRON 16 MG/50ML IVPB (CHCC)
INTRAVENOUS | Status: AC
  Filled 2013-12-05: qty 16

## 2013-12-05 MED ORDER — SODIUM CHLORIDE 0.9 % IV SOLN
Freq: Once | INTRAVENOUS | Status: AC
  Administered 2013-12-05: 10:00:00 via INTRAVENOUS

## 2013-12-05 MED ORDER — HEPARIN SOD (PORK) LOCK FLUSH 100 UNIT/ML IV SOLN
500.0000 [IU] | Freq: Once | INTRAVENOUS | Status: AC | PRN
  Administered 2013-12-05: 500 [IU]
  Filled 2013-12-05: qty 5

## 2013-12-05 MED ORDER — SODIUM CHLORIDE 0.9 % IV SOLN
375.0000 mg/m2 | Freq: Once | INTRAVENOUS | Status: AC
  Administered 2013-12-05: 600 mg via INTRAVENOUS
  Filled 2013-12-05: qty 30

## 2013-12-05 MED ORDER — DOXORUBICIN HCL CHEMO IV INJECTION 2 MG/ML
25.0000 mg/m2 | Freq: Once | INTRAVENOUS | Status: AC
  Administered 2013-12-05: 40 mg via INTRAVENOUS
  Filled 2013-12-05: qty 20

## 2013-12-05 MED ORDER — DEXAMETHASONE SODIUM PHOSPHATE 20 MG/5ML IJ SOLN
20.0000 mg | Freq: Once | INTRAMUSCULAR | Status: AC
  Administered 2013-12-05: 20 mg via INTRAVENOUS

## 2013-12-05 NOTE — Patient Instructions (Signed)
[facility] Discharge Instructions for Patients Receiving Chemotherapy  Today you received the following chemotherapy agents: Adriamycin, Vinblastine, Bleocin, Dacarbazine  To help prevent nausea and vomiting after your treatment, we encourage you to take your nausea medication: Compazine 10 mg every 6 hrs as needed.    If you develop nausea and vomiting that is not controlled by your nausea medication, call the clinic.   BELOW ARE SYMPTOMS THAT SHOULD BE REPORTED IMMEDIATELY:  *FEVER GREATER THAN 100.5 F  *CHILLS WITH OR WITHOUT FEVER  NAUSEA AND VOMITING THAT IS NOT CONTROLLED WITH YOUR NAUSEA MEDICATION  *UNUSUAL SHORTNESS OF BREATH  *UNUSUAL BRUISING OR BLEEDING  TENDERNESS IN MOUTH AND THROAT WITH OR WITHOUT PRESENCE OF ULCERS  *URINARY PROBLEMS  *BOWEL PROBLEMS  UNUSUAL RASH Items with * indicate a potential emergency and should be followed up as soon as possible.  Feel free to call the clinic you have any questions or concerns. The clinic phone number is [phone].

## 2013-12-05 NOTE — Telephone Encounter (Signed)
gv adn prnted appt sched and avs for pt for May adn June....sed added tx.

## 2013-12-05 NOTE — Progress Notes (Signed)
Per Adrena PA okay to tx with creatinine 1.6.

## 2013-12-05 NOTE — Progress Notes (Signed)
Hematology and Oncology Follow Up Visit  Thomas [name] [id number] April 07, 1981 33 y.o. 12/05/2013 2:41 PM   Principle Diagnosis:  33 year old HIV positive gentleman diagnosed with Hodgkin's disease in January of 2015 after he presented with anemia and diffuse lymphadenopathy.   Prior Therapy: He is status post supraclavicular right lymph node biopsy done on 08/02/2013.  Current therapy: Systemic chemotherapy in the form of ABVD started on 10/03/2013.  Interim History:  Thomas [name] presents today for a followup visit accompanied by his mother. Overall is tolerating his chemotherapy without complications.  His appetite is improving rapidly and has gained an additional 5 pounds. He denies fevers or chills or sweats. Denied pruritus or any constitutional symptoms. He is no longer reporting any lymphadenopathy. Denied hematochezia or melena or any bleeding problems. He continues to be quite fatigued and debilitated and has very limited performance status. Has not reported any recent hospitalizations or illnesses. His skin dryness has improved with the use of topical emollients as prescribed. He is encouraged to continue with his increase by mouth intake and to consume 2-3 Ensure type supple omental drinks daily.  Medications: I have reviewed the patient's current medications.  Current Outpatient Prescriptions  Medication Sig Dispense Refill  . atenolol (TENORMIN) 25 MG tablet Take 25 mg by mouth every morning.      Marland Kitchen efavirenz-emtricitabine-tenofovir (ATRIPLA) 600-200-300 MG per tablet Take 1 tablet by mouth at bedtime.      . ferrous sulfate 325 (65 FE) MG tablet Take 1 tablet (325 mg total) by mouth 2 (two) times daily with a meal.  60 tablet  5  . lidocaine-prilocaine (EMLA) cream APPLY TOPICALLY TO PORT AS DIRECTED.  1 each  0  . pantoprazole (PROTONIX) 40 MG tablet Take 1 tablet (40 mg total) by mouth 2 (two) times daily before a meal.  30 tablet  0  . prochlorperazine (COMPAZINE) 10 MG tablet  Take 1 tablet (10 mg total) by mouth every 6 (six) hours as needed for nausea or vomiting.  30 tablet  0  . Skin Protectants, Misc. (EUCERIN) cream Apply to Skin twice a day.  397 g  0   No current facility-administered medications for this visit.   Facility-Administered Medications Ordered in Other Visits  Medication Dose Route Frequency Provider Last Rate Last Dose  . sodium chloride 0.9 % injection 10 mL  10 mL Intravenous PRN Wyatt Portela, MD   10 mL at 10/31/13 1601  . sodium chloride 0.9 % injection 10 mL  10 mL Intracatheter PRN Wyatt Portela, MD   10 mL at 12/05/13 1243     Allergies:  Allergies  Allergen Reactions  . Tylenol [Acetaminophen]     sweats    Past Medical History, Surgical history, Social history, and Family History were reviewed and updated.  Review of Systems: Constitutional:  Negative for fever, chills, night sweats, anorexia, weight loss, pain. Cardiovascular: negative for - chest pain, dyspnea on exertion or palpitations Respiratory: positive for - cough, hemoptysis and orthopnea Neurological: negative for - behavioral changes, dizziness or visual changes Dermatological: negative for acne, eczema and lumps ENT: negative for - headaches, sinus pain, sneezing or sore throat Skin: Negative. Gastrointestinal: negative for - abdominal pain, diarrhea or hematemesis Genito-Urinary: negative for - change in urinary stream, dysuria or hematuria Hematological and Lymphatic: negative for - bruising, jaundice or night sweats Breast: negative Musculoskeletal: negative for - joint swelling, muscle pain or muscular weakness Remaining ROS negative.  Physical Exam: Blood pressure 100/68, pulse 84,  temperature 97.8 F (36.6 C), temperature source Oral, resp. rate 20, height 6\' 1"  (1.854 m), weight 129 lb 3.2 oz (58.605 kg), SpO2 100.00%.  ECOG: 1  General appearance: alert, cooperative and appears stated age Head: Normocephalic, without obvious abnormality,  atraumatic Neck: no adenopathy, no carotid bruit, no JVD, supple, symmetrical, trachea midline and thyroid not enlarged, symmetric, no tenderness/mass/nodules Lymph nodes: Cervical, supraclavicular, and axillary nodes normal. Heart:regular rate and rhythm, S1, S2 normal, no murmur, click, rub or gallop Lung:chest clear, no wheezing, rales, normal symmetric air entry Abdomin: soft, non-tender, without masses or organomegaly EXT:no erythema, induration, or nodules Skin: Globally less dry without distinct rash or lesions   Lab Results: Lab Results  Component Value Date   WBC 5.9 12/05/2013   HGB 10.0* 12/05/2013   HCT 31.1* 12/05/2013   MCV 104.0* 12/05/2013   PLT 168 12/05/2013     Chemistry      Component Value Date/Time   NA 135* 12/05/2013 0901   NA 141 08/04/2013 0339   K 4.0 12/05/2013 0901   K 3.6* 08/04/2013 0339   CL 111 08/04/2013 0339   CO2 23 12/05/2013 0901   CO2 20 08/04/2013 0339   BUN 27.5* 12/05/2013 0901   BUN 12 08/04/2013 0339   CREATININE 1.6* 12/05/2013 0901   CREATININE 0.71 08/04/2013 0339   CREATININE 0.85 06/17/2013 1658      Component Value Date/Time   CALCIUM 9.8 12/05/2013 0901   CALCIUM 7.5* 08/04/2013 0339   ALKPHOS 130 12/05/2013 0901   ALKPHOS 123* 08/02/2013 1620   AST 15 12/05/2013 0901   AST 17 08/02/2013 1620   ALT 14 12/05/2013 0901   ALT 7 08/02/2013 1620   BILITOT <0.20 12/05/2013 0901   BILITOT 0.3 08/02/2013 1620      Impression and Plan:  33 year old gentleman with the following issues:  1. Hodgkin's disease presented with lymphadenopathy above and below the diaphragm without any constitutional symptoms indicating at least stage IIIA. He is ready to proceed with cycle 3 day 8 without any treatment delays.   2. Anemia: This is undoubtedly related to his Hodgkin's disease. His hemoglobin is improving with the treatment which is expected at this time. He does not require any blood transfusions at this time.  3. IV access: Port-A-Cath has already  been inserted and is utilizing EMLA cream   4. Nausea prophylaxis: A prescription for anti-emetics is available for him to take.  5. HIV: He is on Atripla and followed by ID.   6. thrombocytopenia: Likely related to his malignancy involvement of the bone marrow. His blood counts are recovering nicely.  7. Followup: Will be on 12/19/2013 for cycle 3 day 15 ABVD.  8. Neutropenic prophylaxis: To receive Neulasta after each chemotherapy treatment.  9. Skin dryness: Continue Eucerin cream to apply to affected area as needed.   Carlton Adam, PA-C  5/15/20152:41 PM

## 2013-12-06 ENCOUNTER — Ambulatory Visit (HOSPITAL_BASED_OUTPATIENT_CLINIC_OR_DEPARTMENT_OTHER): Payer: Medicare Other

## 2013-12-06 VITALS — BP 91/56 | HR 93 | Temp 97.8°F

## 2013-12-06 DIAGNOSIS — Z5189 Encounter for other specified aftercare: Secondary | ICD-10-CM | POA: Diagnosis not present

## 2013-12-06 DIAGNOSIS — C8190 Hodgkin lymphoma, unspecified, unspecified site: Secondary | ICD-10-CM | POA: Diagnosis not present

## 2013-12-06 MED ORDER — PEGFILGRASTIM INJECTION 6 MG/0.6ML
6.0000 mg | Freq: Once | SUBCUTANEOUS | Status: AC
  Administered 2013-12-06: 6 mg via SUBCUTANEOUS

## 2013-12-08 NOTE — Patient Instructions (Signed)
Continue using Eucerin as prescribed for your dry skin Continue labs chemotherapy as scheduled Followup 2 weeks

## 2013-12-10 ENCOUNTER — Ambulatory Visit (HOSPITAL_COMMUNITY)
Admission: RE | Admit: 2013-12-10 | Discharge: 2013-12-10 | Disposition: A | Discharge status: Home / Self Care | Payer: Medicare Other | Source: Ambulatory Visit | Attending: Oncology | Admitting: Oncology

## 2013-12-10 ENCOUNTER — Encounter (HOSPITAL_COMMUNITY): Payer: Self-pay

## 2013-12-10 DIAGNOSIS — N200 Calculus of kidney: Secondary | ICD-10-CM | POA: Diagnosis not present

## 2013-12-10 DIAGNOSIS — N1330 Unspecified hydronephrosis: Secondary | ICD-10-CM | POA: Insufficient documentation

## 2013-12-10 DIAGNOSIS — R161 Splenomegaly, not elsewhere classified: Secondary | ICD-10-CM | POA: Diagnosis not present

## 2013-12-10 DIAGNOSIS — C8190 Hodgkin lymphoma, unspecified, unspecified site: Secondary | ICD-10-CM | POA: Diagnosis not present

## 2013-12-10 DIAGNOSIS — N201 Calculus of ureter: Secondary | ICD-10-CM | POA: Diagnosis not present

## 2013-12-10 DIAGNOSIS — D509 Iron deficiency anemia, unspecified: Secondary | ICD-10-CM

## 2013-12-10 DIAGNOSIS — Z21 Asymptomatic human immunodeficiency virus [HIV] infection status: Secondary | ICD-10-CM | POA: Diagnosis not present

## 2013-12-10 DIAGNOSIS — R599 Enlarged lymph nodes, unspecified: Secondary | ICD-10-CM | POA: Diagnosis not present

## 2013-12-10 DIAGNOSIS — K8020 Calculus of gallbladder without cholecystitis without obstruction: Secondary | ICD-10-CM | POA: Diagnosis not present

## 2013-12-10 DIAGNOSIS — C8589 Other specified types of non-Hodgkin lymphoma, extranodal and solid organ sites: Secondary | ICD-10-CM | POA: Diagnosis not present

## 2013-12-10 LAB — GLUCOSE, CAPILLARY: GLUCOSE-CAPILLARY: 85 mg/dL (ref 70–99)

## 2013-12-10 MED ORDER — FLUDEOXYGLUCOSE F - 18 (FDG) INJECTION
7.0000 | Freq: Once | INTRAVENOUS | Status: AC | PRN
  Administered 2013-12-10: 7 via INTRAVENOUS

## 2013-12-12 ENCOUNTER — Other Ambulatory Visit: Payer: Medicare Other

## 2013-12-12 ENCOUNTER — Ambulatory Visit: Payer: Medicare Other | Admitting: Oncology

## 2013-12-12 ENCOUNTER — Ambulatory Visit: Payer: Medicare Other

## 2013-12-13 ENCOUNTER — Ambulatory Visit: Payer: Medicare Other

## 2013-12-19 ENCOUNTER — Ambulatory Visit (HOSPITAL_BASED_OUTPATIENT_CLINIC_OR_DEPARTMENT_OTHER): Payer: Medicare Other | Admitting: Physician Assistant

## 2013-12-19 ENCOUNTER — Ambulatory Visit (HOSPITAL_BASED_OUTPATIENT_CLINIC_OR_DEPARTMENT_OTHER): Payer: Medicare Other

## 2013-12-19 ENCOUNTER — Telehealth: Payer: Self-pay | Admitting: Oncology

## 2013-12-19 ENCOUNTER — Encounter: Payer: Self-pay | Admitting: Physician Assistant

## 2013-12-19 ENCOUNTER — Other Ambulatory Visit: Payer: Medicare Other

## 2013-12-19 ENCOUNTER — Other Ambulatory Visit (HOSPITAL_BASED_OUTPATIENT_CLINIC_OR_DEPARTMENT_OTHER): Payer: Medicare Other

## 2013-12-19 VITALS — BP 95/57 | HR 89

## 2013-12-19 VITALS — BP 90/64 | HR 74 | Temp 98.0°F | Resp 17 | Ht 73.0 in | Wt 133.9 lb

## 2013-12-19 DIAGNOSIS — R599 Enlarged lymph nodes, unspecified: Secondary | ICD-10-CM | POA: Diagnosis not present

## 2013-12-19 DIAGNOSIS — C8190 Hodgkin lymphoma, unspecified, unspecified site: Secondary | ICD-10-CM

## 2013-12-19 DIAGNOSIS — D696 Thrombocytopenia, unspecified: Secondary | ICD-10-CM

## 2013-12-19 DIAGNOSIS — D649 Anemia, unspecified: Secondary | ICD-10-CM

## 2013-12-19 DIAGNOSIS — B20 Human immunodeficiency virus [HIV] disease: Secondary | ICD-10-CM

## 2013-12-19 DIAGNOSIS — C8198 Hodgkin lymphoma, unspecified, lymph nodes of multiple sites: Secondary | ICD-10-CM | POA: Diagnosis not present

## 2013-12-19 DIAGNOSIS — Z5111 Encounter for antineoplastic chemotherapy: Secondary | ICD-10-CM | POA: Diagnosis not present

## 2013-12-19 DIAGNOSIS — N201 Calculus of ureter: Secondary | ICD-10-CM | POA: Diagnosis not present

## 2013-12-19 LAB — CBC WITH DIFFERENTIAL/PLATELET
BASO%: 0.6 % (ref 0.0–2.0)
Basophils Absolute: 0 10*3/uL (ref 0.0–0.1)
EOS%: 1.2 % (ref 0.0–7.0)
Eosinophils Absolute: 0.1 10*3/uL (ref 0.0–0.5)
HCT: 32.3 % — ABNORMAL LOW (ref 38.4–49.9)
HGB: 10.7 g/dL — ABNORMAL LOW (ref 13.0–17.1)
LYMPH%: 16.5 % (ref 14.0–49.0)
MCH: 35.1 pg — ABNORMAL HIGH (ref 27.2–33.4)
MCHC: 33 g/dL (ref 32.0–36.0)
MCV: 106.6 fL — ABNORMAL HIGH (ref 79.3–98.0)
MONO#: 0.5 10*3/uL (ref 0.1–0.9)
MONO%: 10.1 % (ref 0.0–14.0)
NEUT#: 3.4 10*3/uL (ref 1.5–6.5)
NEUT%: 71.6 % (ref 39.0–75.0)
Platelets: 181 10*3/uL (ref 140–400)
RBC: 3.04 10*6/uL — ABNORMAL LOW (ref 4.20–5.82)
RDW: 17.3 % — ABNORMAL HIGH (ref 11.0–14.6)
WBC: 4.7 10*3/uL (ref 4.0–10.3)
lymph#: 0.8 10*3/uL — ABNORMAL LOW (ref 0.9–3.3)

## 2013-12-19 LAB — COMPREHENSIVE METABOLIC PANEL (CC13)
ALT: 11 U/L (ref 0–55)
AST: 13 U/L (ref 5–34)
Albumin: 3.2 g/dL — ABNORMAL LOW (ref 3.5–5.0)
Alkaline Phosphatase: 136 U/L (ref 40–150)
Anion Gap: 11 mEq/L (ref 3–11)
BUN: 22.8 mg/dL (ref 7.0–26.0)
CALCIUM: 9.2 mg/dL (ref 8.4–10.4)
CHLORIDE: 107 meq/L (ref 98–109)
CO2: 18 mEq/L — ABNORMAL LOW (ref 22–29)
CREATININE: 1.7 mg/dL — AB (ref 0.7–1.3)
Glucose: 100 mg/dl (ref 70–140)
POTASSIUM: 3.3 meq/L — AB (ref 3.5–5.1)
Sodium: 136 mEq/L (ref 136–145)
Total Bilirubin: 0.21 mg/dL (ref 0.20–1.20)
Total Protein: 7.6 g/dL (ref 6.4–8.3)

## 2013-12-19 MED ORDER — SODIUM CHLORIDE 0.9 % IJ SOLN
10.0000 mL | INTRAMUSCULAR | Status: DC | PRN
  Administered 2013-12-19: 10 mL
  Filled 2013-12-19: qty 10

## 2013-12-19 MED ORDER — BLEOMYCIN SULFATE CHEMO INJECTION 30 UNIT
10.0000 [IU]/m2 | Freq: Once | INTRAMUSCULAR | Status: AC
  Administered 2013-12-19: 16 [IU] via INTRAVENOUS
  Filled 2013-12-19: qty 5.33

## 2013-12-19 MED ORDER — DEXAMETHASONE SODIUM PHOSPHATE 20 MG/5ML IJ SOLN
INTRAMUSCULAR | Status: AC
  Filled 2013-12-19: qty 5

## 2013-12-19 MED ORDER — SODIUM CHLORIDE 0.9 % IV SOLN
6.1500 mg/m2 | Freq: Once | INTRAVENOUS | Status: AC
  Administered 2013-12-19: 10 mg via INTRAVENOUS
  Filled 2013-12-19: qty 10

## 2013-12-19 MED ORDER — DACARBAZINE 200 MG IV SOLR
375.0000 mg/m2 | Freq: Once | INTRAVENOUS | Status: AC
  Administered 2013-12-19: 600 mg via INTRAVENOUS
  Filled 2013-12-19: qty 30

## 2013-12-19 MED ORDER — DEXAMETHASONE SODIUM PHOSPHATE 20 MG/5ML IJ SOLN
20.0000 mg | Freq: Once | INTRAMUSCULAR | Status: AC
  Administered 2013-12-19: 20 mg via INTRAVENOUS

## 2013-12-19 MED ORDER — HEPARIN SOD (PORK) LOCK FLUSH 100 UNIT/ML IV SOLN
500.0000 [IU] | Freq: Once | INTRAVENOUS | Status: AC | PRN
  Administered 2013-12-19: 500 [IU]
  Filled 2013-12-19: qty 5

## 2013-12-19 MED ORDER — ONDANSETRON 16 MG/50ML IVPB (CHCC)
16.0000 mg | Freq: Once | INTRAVENOUS | Status: AC
  Administered 2013-12-19: 16 mg via INTRAVENOUS

## 2013-12-19 MED ORDER — SODIUM CHLORIDE 0.9 % IV SOLN
Freq: Once | INTRAVENOUS | Status: AC
  Administered 2013-12-19: 10:00:00 via INTRAVENOUS

## 2013-12-19 MED ORDER — DOXORUBICIN HCL CHEMO IV INJECTION 2 MG/ML
25.0000 mg/m2 | Freq: Once | INTRAVENOUS | Status: AC
Start: 2013-12-19 — End: 2013-12-19
  Administered 2013-12-19: 40 mg via INTRAVENOUS
  Filled 2013-12-19: qty 20

## 2013-12-19 MED ORDER — ONDANSETRON 16 MG/50ML IVPB (CHCC)
INTRAVENOUS | Status: AC
Start: 2013-12-19 — End: 2013-12-19
  Filled 2013-12-19: qty 16

## 2013-12-19 NOTE — Patient Instructions (Signed)
[facility] Discharge Instructions for Patients Receiving Chemotherapy  Today you received the following chemotherapy agents :  Adriamycin, Velban, Bleomycin, Dacarbazine.  To help prevent nausea and vomiting after your treatment, we encourage you to take your nausea medication as prescribed.   If you develop nausea and vomiting that is not controlled by your nausea medication, call the clinic.   BELOW ARE SYMPTOMS THAT SHOULD BE REPORTED IMMEDIATELY:  *FEVER GREATER THAN 100.5 F  *CHILLS WITH OR WITHOUT FEVER  NAUSEA AND VOMITING THAT IS NOT CONTROLLED WITH YOUR NAUSEA MEDICATION  *UNUSUAL SHORTNESS OF BREATH  *UNUSUAL BRUISING OR BLEEDING  TENDERNESS IN MOUTH AND THROAT WITH OR WITHOUT PRESENCE OF ULCERS  *URINARY PROBLEMS  *BOWEL PROBLEMS  UNUSUAL RASH Items with * indicate a potential emergency and should be followed up as soon as possible.  Feel free to call the clinic you have any questions or concerns. The clinic phone number is [phone].

## 2013-12-19 NOTE — Patient Instructions (Signed)
Your PET scan showed significant improvment in your disease Follow up in 2 weeks

## 2013-12-19 NOTE — Progress Notes (Signed)
Pt saw Thomas [name], Thomas [name] today prior to chemo.  Proceed with chemo as planned after PA reviewed all lab results.  Explanations given to pt about giving extra normal saline due to low blood pressure.  Instructed pt to eat foods high in potassium due to low potassium level as per PA's instructions.  Pt voiced understanding.

## 2013-12-19 NOTE — Progress Notes (Addendum)
Hematology and Oncology Follow Up Visit  Thomas [name] [id number] 1980-08-31 33 y.o. 12/19/2013 1:52 PM   Principle Diagnosis:  33 year old HIV positive gentleman diagnosed with Hodgkin's disease in January of 2015 after he presented with anemia and diffuse lymphadenopathy.   Prior Therapy: He is status post supraclavicular right lymph node biopsy done on 08/02/2013.  Current therapy: Systemic chemotherapy in the form of ABVD started on 10/03/2013.  Interim History:  Thomas [name] presents today for a followup visit un-accompanied. He reports that his mother is currently in orientation with the city to drive buses.  Overall is tolerating his chemotherapy without complications.  His appetite continues to improve and has gained an additional 4.7 pounds. He denies fevers or chills or sweats. Denied pruritus or any constitutional symptoms. He is no longer reporting any lymphadenopathy. Denied hematochezia or melena or any bleeding problems. He continues to be quite fatigued and debilitated and has very limited performance status. Has not reported any recent hospitalizations or illnesses. His skin dryness has improved with the use of topical emollients as prescribed. He is encouraged to continue with his increased by mouth intake and to consume 2-3 Ensure type supple omental drinks daily. He recently had a half restaging PET CT scan and presents to discuss results as well as proceed with day 15 of his current cycle of chemotherapy.  Medications: I have reviewed the patient's current medications.  Current Outpatient Prescriptions  Medication Sig Dispense Refill  . atenolol (TENORMIN) 25 MG tablet Take 25 mg by mouth every morning.      Marland Kitchen efavirenz-emtricitabine-tenofovir (ATRIPLA) 600-200-300 MG per tablet Take 1 tablet by mouth at bedtime.      . ferrous sulfate 325 (65 FE) MG tablet Take 1 tablet (325 mg total) by mouth 2 (two) times daily with a meal.  60 tablet  5  . lidocaine-prilocaine (EMLA)  cream APPLY TOPICALLY TO PORT AS DIRECTED.  1 each  0  . pantoprazole (PROTONIX) 40 MG tablet Take 1 tablet (40 mg total) by mouth 2 (two) times daily before a meal.  30 tablet  0  . prochlorperazine (COMPAZINE) 10 MG tablet Take 1 tablet (10 mg total) by mouth every 6 (six) hours as needed for nausea or vomiting.  30 tablet  0  . Skin Protectants, Misc. (EUCERIN) cream Apply to Skin twice a day.  397 g  0   No current facility-administered medications for this visit.   Facility-Administered Medications Ordered in Other Visits  Medication Dose Route Frequency Provider Last Rate Last Dose  . sodium chloride 0.9 % injection 10 mL  10 mL Intravenous PRN Wyatt Portela, MD   10 mL at 10/31/13 1601  . sodium chloride 0.9 % injection 10 mL  10 mL Intracatheter PRN Wyatt Portela, MD   10 mL at 12/19/13 1241     Allergies:  Allergies  Allergen Reactions  . Tylenol [Acetaminophen]     sweats    Past Medical History, Surgical history, Social history, and Family History were reviewed and updated.  Review of Systems: Constitutional:  Negative for fever, chills, night sweats, anorexia, weight loss, pain. Cardiovascular: negative for - chest pain, dyspnea on exertion or palpitations Respiratory: positive for - cough, hemoptysis and orthopnea Neurological: negative for - behavioral changes, dizziness or visual changes Dermatological: negative for acne, eczema and lumps ENT: negative for - headaches, sinus pain, sneezing or sore throat Skin: Negative. Gastrointestinal: negative for - abdominal pain, diarrhea or hematemesis Genito-Urinary: negative for - change in  urinary stream, dysuria or hematuria Hematological and Lymphatic: negative for - bruising, jaundice or night sweats Breast: negative Musculoskeletal: negative for - joint swelling, muscle pain or muscular weakness Remaining ROS negative.  Physical Exam: Blood pressure 90/64, pulse 74, temperature 98 F (36.7 C), temperature source  Oral, resp. rate 17, height 6\' 1"  (1.854 m), weight 133 lb 14.4 oz (60.737 kg), SpO2 100.00%.  ECOG: 1  General appearance: alert, cooperative and appears stated age Head: Normocephalic, without obvious abnormality, atraumatic Neck: no carotid bruit, no JVD, supple, symmetrical, trachea midline, thyroid not enlarged, symmetric, no tenderness/mass/nodules and Mild palpable left supraclavicular adenopathy, nontender Lymph nodes: Mildly palpable/1+ left supraclavicular lymphadenopathy, nontender Heart:regular rate and rhythm, S1, S2 normal, no murmur, click, rub or gallop Lung:chest clear, no wheezing, rales, normal symmetric air entry Abdomin: soft, non-tender, without masses or organomegaly EXT:no erythema, induration, or nodules Skin: Globally less dry without distinct rash or lesions   Lab Results: Lab Results  Component Value Date   WBC 4.7 12/19/2013   HGB 10.7* 12/19/2013   HCT 32.3* 12/19/2013   MCV 106.6* 12/19/2013   PLT 181 12/19/2013     Chemistry      Component Value Date/Time   NA 136 12/19/2013 0818   NA 141 08/04/2013 0339   K 3.3* 12/19/2013 0818   K 3.6* 08/04/2013 0339   CL 111 08/04/2013 0339   CO2 18* 12/19/2013 0818   CO2 20 08/04/2013 0339   BUN 22.8 12/19/2013 0818   BUN 12 08/04/2013 0339   CREATININE 1.7* 12/19/2013 0818   CREATININE 0.71 08/04/2013 0339   CREATININE 0.85 06/17/2013 1658      Component Value Date/Time   CALCIUM 9.2 12/19/2013 0818   CALCIUM 7.5* 08/04/2013 0339   ALKPHOS 136 12/19/2013 0818   ALKPHOS 123* 08/02/2013 1620   AST 13 12/19/2013 0818   AST 17 08/02/2013 1620   ALT 11 12/19/2013 0818   ALT 7 08/02/2013 1620   BILITOT 0.21 12/19/2013 0818   BILITOT 0.3 08/02/2013 1620      Impression and Plan:  34 year old gentleman with the following issues:  1. Hodgkin's disease presented with lymphadenopathy above and below the diaphragm without any constitutional symptoms indicating at least stage IIIA. He is ready to proceed with cycle 3 day 15  without any treatment delays. His restaging PET/CT revealed marked response to therapy of his lymphoma within the neck, chest, abdomen and pelvis. No residual malignant range activity identified. There is decreased splenomegaly with resolution of splenic hypermetabolism. Nontender DG avid adenopathy remains drop the chest abdomen and pelvis. Hypermetabolic superior segment left lower lobe opacity with new ill-defined right lower lobe nodule opacities, favoring infection. His reticulocyte recommendation correlate with infectious symptoms. Right ureteric calculus with similar moderate right hydronephrosis. Bilateral nephrolithiasis as well as cholelithiasis.   2. Anemia: This is undoubtedly related to his Hodgkin's disease. His hemoglobin continues to improve with the treatment which is expected at this time. He does not require any blood transfusions at this time.  3. IV access: Port-A-Cath has already been inserted and is utilizing EMLA cream   4. Nausea prophylaxis: A prescription for anti-emetics is available for him to take.  5. HIV: He is on Atripla and followed by ID.   6. thrombocytopenia: Likely related to his malignancy involvement of the bone marrow. His blood counts are recovering nicely.  7. Followup: Will be on 01/02/2014 for cycle 4 day 1 ABVD.  8. Neutropenic prophylaxis: To receive Neulasta after each chemotherapy treatment.  9. Skin dryness: Continue Eucerin cream to apply to affected area as needed.  10. Hypokalemia-patient's potassium level is 3.3 mEq per liter today. He is advised to increase his dietary intake of potassium rich foods  11. Mild renal insufficiency-his creatinine today is 1.7. Patient will receive 500 mL is of normal saline in the infusion area today with this chemotherapy.   Carlton Adam, PA-C  5/29/20151:52 PM

## 2013-12-19 NOTE — Telephone Encounter (Signed)
gv and printed appt sched and avs for pt for May and June °

## 2013-12-20 ENCOUNTER — Ambulatory Visit (HOSPITAL_BASED_OUTPATIENT_CLINIC_OR_DEPARTMENT_OTHER): Payer: Medicare Other

## 2013-12-20 VITALS — BP 94/61 | HR 99 | Temp 98.0°F

## 2013-12-20 DIAGNOSIS — C8198 Hodgkin lymphoma, unspecified, lymph nodes of multiple sites: Secondary | ICD-10-CM | POA: Diagnosis not present

## 2013-12-20 DIAGNOSIS — Z5189 Encounter for other specified aftercare: Secondary | ICD-10-CM

## 2013-12-20 MED ORDER — PEGFILGRASTIM INJECTION 6 MG/0.6ML
6.0000 mg | Freq: Once | SUBCUTANEOUS | Status: AC
  Administered 2013-12-20: 6 mg via SUBCUTANEOUS

## 2013-12-20 NOTE — Patient Instructions (Signed)

## 2013-12-23 ENCOUNTER — Encounter: Payer: Self-pay | Admitting: Oncology

## 2013-12-23 ENCOUNTER — Telehealth: Payer: Self-pay | Admitting: Medical Oncology

## 2013-12-23 NOTE — Telephone Encounter (Signed)
Per MD, informed patient's mother that letter from Dr. Alen Blew is ready for p.u. And will be placed with Ms. Thomas [name]. Mrs Thomas [name] expressed thanks and states will pick up ltr tomorrow.

## 2014-01-02 ENCOUNTER — Ambulatory Visit (HOSPITAL_BASED_OUTPATIENT_CLINIC_OR_DEPARTMENT_OTHER): Payer: Medicare Other

## 2014-01-02 ENCOUNTER — Telehealth: Payer: Self-pay | Admitting: Oncology

## 2014-01-02 ENCOUNTER — Other Ambulatory Visit (HOSPITAL_BASED_OUTPATIENT_CLINIC_OR_DEPARTMENT_OTHER): Payer: Medicare Other

## 2014-01-02 ENCOUNTER — Other Ambulatory Visit: Payer: Medicare Other

## 2014-01-02 ENCOUNTER — Telehealth: Payer: Self-pay | Admitting: *Deleted

## 2014-01-02 ENCOUNTER — Ambulatory Visit (HOSPITAL_BASED_OUTPATIENT_CLINIC_OR_DEPARTMENT_OTHER): Payer: Medicare Other | Admitting: Oncology

## 2014-01-02 ENCOUNTER — Ambulatory Visit: Payer: Medicare Other

## 2014-01-02 ENCOUNTER — Encounter: Payer: Self-pay | Admitting: Oncology

## 2014-01-02 VITALS — BP 91/58 | HR 81 | Temp 98.7°F | Resp 20 | Ht 73.0 in | Wt 138.6 lb

## 2014-01-02 DIAGNOSIS — C8198 Hodgkin lymphoma, unspecified, lymph nodes of multiple sites: Secondary | ICD-10-CM

## 2014-01-02 DIAGNOSIS — B20 Human immunodeficiency virus [HIV] disease: Secondary | ICD-10-CM | POA: Diagnosis not present

## 2014-01-02 DIAGNOSIS — D649 Anemia, unspecified: Secondary | ICD-10-CM | POA: Diagnosis not present

## 2014-01-02 DIAGNOSIS — Z5111 Encounter for antineoplastic chemotherapy: Secondary | ICD-10-CM

## 2014-01-02 DIAGNOSIS — D696 Thrombocytopenia, unspecified: Secondary | ICD-10-CM | POA: Diagnosis not present

## 2014-01-02 DIAGNOSIS — N201 Calculus of ureter: Secondary | ICD-10-CM | POA: Diagnosis not present

## 2014-01-02 DIAGNOSIS — C8190 Hodgkin lymphoma, unspecified, unspecified site: Secondary | ICD-10-CM

## 2014-01-02 DIAGNOSIS — N289 Disorder of kidney and ureter, unspecified: Secondary | ICD-10-CM | POA: Diagnosis not present

## 2014-01-02 DIAGNOSIS — E876 Hypokalemia: Secondary | ICD-10-CM

## 2014-01-02 LAB — CBC WITH DIFFERENTIAL/PLATELET
BASO%: 0.8 % (ref 0.0–2.0)
BASOS ABS: 0 10*3/uL (ref 0.0–0.1)
EOS%: 0.7 % (ref 0.0–7.0)
Eosinophils Absolute: 0 10*3/uL (ref 0.0–0.5)
HCT: 32.7 % — ABNORMAL LOW (ref 38.4–49.9)
HEMOGLOBIN: 10.8 g/dL — AB (ref 13.0–17.1)
LYMPH%: 13.4 % — ABNORMAL LOW (ref 14.0–49.0)
MCH: 35.2 pg — AB (ref 27.2–33.4)
MCHC: 32.9 g/dL (ref 32.0–36.0)
MCV: 106.9 fL — ABNORMAL HIGH (ref 79.3–98.0)
MONO#: 0.5 10*3/uL (ref 0.1–0.9)
MONO%: 9.7 % (ref 0.0–14.0)
NEUT#: 4.2 10*3/uL (ref 1.5–6.5)
NEUT%: 75.4 % — ABNORMAL HIGH (ref 39.0–75.0)
Platelets: 170 10*3/uL (ref 140–400)
RBC: 3.06 10*6/uL — AB (ref 4.20–5.82)
RDW: 15.8 % — AB (ref 11.0–14.6)
WBC: 5.6 10*3/uL (ref 4.0–10.3)
lymph#: 0.7 10*3/uL — ABNORMAL LOW (ref 0.9–3.3)

## 2014-01-02 LAB — COMPREHENSIVE METABOLIC PANEL (CC13)
ALBUMIN: 3.4 g/dL — AB (ref 3.5–5.0)
ALK PHOS: 138 U/L (ref 40–150)
ALT: 11 U/L (ref 0–55)
AST: 13 U/L (ref 5–34)
Anion Gap: 7 mEq/L (ref 3–11)
BUN: 20.1 mg/dL (ref 7.0–26.0)
CHLORIDE: 104 meq/L (ref 98–109)
CO2: 25 mEq/L (ref 22–29)
Calcium: 9.4 mg/dL (ref 8.4–10.4)
Creatinine: 1.8 mg/dL — ABNORMAL HIGH (ref 0.7–1.3)
Glucose: 88 mg/dl (ref 70–140)
POTASSIUM: 3.7 meq/L (ref 3.5–5.1)
SODIUM: 135 meq/L — AB (ref 136–145)
TOTAL PROTEIN: 7.7 g/dL (ref 6.4–8.3)
Total Bilirubin: 0.21 mg/dL (ref 0.20–1.20)

## 2014-01-02 MED ORDER — SODIUM CHLORIDE 0.9 % IV SOLN
375.0000 mg/m2 | Freq: Once | INTRAVENOUS | Status: AC
  Administered 2014-01-02: 600 mg via INTRAVENOUS
  Filled 2014-01-02: qty 30

## 2014-01-02 MED ORDER — ONDANSETRON 16 MG/50ML IVPB (CHCC)
16.0000 mg | Freq: Once | INTRAVENOUS | Status: AC
  Administered 2014-01-02: 16 mg via INTRAVENOUS

## 2014-01-02 MED ORDER — HEPARIN SOD (PORK) LOCK FLUSH 100 UNIT/ML IV SOLN
500.0000 [IU] | Freq: Once | INTRAVENOUS | Status: AC | PRN
  Administered 2014-01-02: 500 [IU]
  Filled 2014-01-02: qty 5

## 2014-01-02 MED ORDER — ONDANSETRON 16 MG/50ML IVPB (CHCC)
INTRAVENOUS | Status: AC
  Filled 2014-01-02: qty 16

## 2014-01-02 MED ORDER — DEXAMETHASONE SODIUM PHOSPHATE 20 MG/5ML IJ SOLN
20.0000 mg | Freq: Once | INTRAMUSCULAR | Status: AC
  Administered 2014-01-02: 20 mg via INTRAVENOUS

## 2014-01-02 MED ORDER — DEXAMETHASONE SODIUM PHOSPHATE 20 MG/5ML IJ SOLN
INTRAMUSCULAR | Status: AC
  Filled 2014-01-02: qty 5

## 2014-01-02 MED ORDER — SODIUM CHLORIDE 0.9 % IV SOLN
6.0000 mg/m2 | Freq: Once | INTRAVENOUS | Status: AC
  Administered 2014-01-02: 10 mg via INTRAVENOUS
  Filled 2014-01-02: qty 10

## 2014-01-02 MED ORDER — SODIUM CHLORIDE 0.9 % IV SOLN
10.0000 [IU]/m2 | Freq: Once | INTRAVENOUS | Status: AC
  Administered 2014-01-02: 16 [IU] via INTRAVENOUS
  Filled 2014-01-02: qty 5.33

## 2014-01-02 MED ORDER — DOXORUBICIN HCL CHEMO IV INJECTION 2 MG/ML
25.0000 mg/m2 | Freq: Once | INTRAVENOUS | Status: AC
  Administered 2014-01-02: 40 mg via INTRAVENOUS
  Filled 2014-01-02: qty 20

## 2014-01-02 MED ORDER — SODIUM CHLORIDE 0.9 % IV SOLN
Freq: Once | INTRAVENOUS | Status: AC
  Administered 2014-01-02: 13:00:00 via INTRAVENOUS

## 2014-01-02 MED ORDER — SODIUM CHLORIDE 0.9 % IJ SOLN
10.0000 mL | INTRAMUSCULAR | Status: DC | PRN
  Administered 2014-01-02: 10 mL
  Filled 2014-01-02: qty 10

## 2014-01-02 NOTE — Progress Notes (Signed)
Hematology and Oncology Follow Up Visit  Thomas [name] [id number] 07/05/1981 33 y.o. 01/02/2014 11:55 AM   Principle Diagnosis:  33 year old HIV positive gentleman diagnosed with Hodgkin's disease in January of 2015 after he presented with anemia and diffuse lymphadenopathy. Stage IIIA disease.    Prior Therapy: He is status post supraclavicular right lymph node biopsy done on 08/02/2013.  Current therapy: Systemic chemotherapy in the form of ABVD started on 10/03/2013.  Interim History:  Thomas [name] presents today for a followup visit by himself. Since his last visit, he continues to do very well. He continues to gain weight and have improved appetite. He also noticed significant improvement in his quality of life and energy level. He is tolerating his chemotherapy without complications. He denies any nausea, vomiting or abdominal pain. He does not report any chest pain or shortness of breath. He denies fevers or chills or sweats. Denied pruritus or any constitutional symptoms. He is no longer reporting any lymphadenopathy. Denied hematochezia or melena or any bleeding problems. Has not reported any recent hospitalizations or illnesses. His skin dryness has improved with the use of topical emollients as prescribed. He does not report any headaches or blurry vision or double vision. Did not report any alteration of mental status her psychiatric issues. As that report any syncope or seizures. Does not report any chest pain palpitation or difficulty breathing. Does not report any leg edema or orthopnea. He does not report any abdominal distention or early satiety. As that report any ascites. Does not report any musculoskeletal complaints. Does not report any arthralgias or myalgias or skin rashes or lymphadenopathy. Is not reporting any petechiae rashes or bleeding.  Medications: I have reviewed the patient's current medications. Unchanged by my review today Current Outpatient Prescriptions   Medication Sig Dispense Refill  . atenolol (TENORMIN) 25 MG tablet Take 25 mg by mouth every morning.      Marland Kitchen efavirenz-emtricitabine-tenofovir (ATRIPLA) 600-200-300 MG per tablet Take 1 tablet by mouth at bedtime.      . ferrous sulfate 325 (65 FE) MG tablet Take 1 tablet (325 mg total) by mouth 2 (two) times daily with a meal.  60 tablet  5  . lidocaine-prilocaine (EMLA) cream APPLY TOPICALLY TO PORT AS DIRECTED.  1 each  0  . pantoprazole (PROTONIX) 40 MG tablet Take 1 tablet (40 mg total) by mouth 2 (two) times daily before a meal.  30 tablet  0  . prochlorperazine (COMPAZINE) 10 MG tablet Take 1 tablet (10 mg total) by mouth every 6 (six) hours as needed for nausea or vomiting.  30 tablet  0  . Skin Protectants, Misc. (EUCERIN) cream Apply to Skin twice a day.  397 g  0   No current facility-administered medications for this visit.   Facility-Administered Medications Ordered in Other Visits  Medication Dose Route Frequency Provider Last Rate Last Dose  . sodium chloride 0.9 % injection 10 mL  10 mL Intravenous PRN Wyatt Portela, MD   10 mL at 10/31/13 1601     Allergies:  Allergies  Allergen Reactions  . Tylenol [Acetaminophen]     sweats    Past Medical History, Surgical history, Social history, and Family History were reviewed and updated.  Review of Systems:  Remaining ROS negative.  Physical Exam: Blood pressure 91/58, pulse 81, temperature 98.7 F (37.1 C), temperature source Oral, resp. rate 20, height 6\' 1"  (1.854 m), weight 138 lb 9.6 oz (62.869 kg).  ECOG: 1  General appearance: alert and  cooperative much healthier appearing gentleman than previously.  Head: Normocephalic, without obvious abnormality, atraumatic Neck: no carotid bruit no thyroid masses or lymphadenopathy. Lymph nodes: No lymphadenopathy noted.  Heart:regular rate and rhythm, S1, S2 normal, no murmur, click, rub or gallop Lung:chest clear, no wheezing, rales, normal symmetric air  entry Abdomin: soft, non-tender, without masses or organomegaly EXT:no erythema, induration, or nodules Skin: Dry skin noted. Neurological exam: No motor or since deficits.    Lab Results: Lab Results  Component Value Date   WBC 5.6 01/02/2014   HGB 10.8* 01/02/2014   HCT 32.7* 01/02/2014   MCV 106.9* 01/02/2014   PLT 170 01/02/2014     Chemistry      Component Value Date/Time   NA 136 12/19/2013 0818   NA 141 08/04/2013 0339   K 3.3* 12/19/2013 0818   K 3.6* 08/04/2013 0339   CL 111 08/04/2013 0339   CO2 18* 12/19/2013 0818   CO2 20 08/04/2013 0339   BUN 22.8 12/19/2013 0818   BUN 12 08/04/2013 0339   CREATININE 1.7* 12/19/2013 0818   CREATININE 0.71 08/04/2013 0339   CREATININE 0.85 06/17/2013 1658      Component Value Date/Time   CALCIUM 9.2 12/19/2013 0818   CALCIUM 7.5* 08/04/2013 0339   ALKPHOS 136 12/19/2013 0818   ALKPHOS 123* 08/02/2013 1620   AST 13 12/19/2013 0818   AST 17 08/02/2013 1620   ALT 11 12/19/2013 0818   ALT 7 08/02/2013 1620   BILITOT 0.21 12/19/2013 0818   BILITOT 0.3 08/02/2013 1620      Impression and Plan:  33 year old gentleman with the following issues:  1. Hodgkin's disease presented with lymphadenopathy above and below the diaphragm as well as bone marrow involvement without any constitutional symptoms indicating at least stage IIIA. He is status post 3 cycles of ABVD with a PET/CT scan on 12/10/2013 showed near-complete response. The plan is to continue with the current regimen for a total of 6 cycles. He continues to tolerate chemotherapy without any complications and continues to have excellent clinical response. If continued to gain weight and have dramatic improvements in his performance status.   2. Anemia: This is undoubtedly related to his Hodgkin's disease. His hemoglobin has improved and does not require any intervention.  3. IV access: Port-A-Cath has already been inserted and is utilizing EMLA cream. No complications noted to  4. Nausea  prophylaxis: A prescription for anti-emetics is available for him to take. But has not been an issue at this time.  5. HIV: He is on Atripla and followed by infectious disease service.  6. Thrombocytopenia: Likely related to his malignancy involvement of the bone marrow. His count has normalized now.  7. Followup: Will be 6/26 before his next chemotherapy cycle which will be day 8 of cycle 4.  8. Neutropenic prophylaxis: To receive Neulasta after each chemotherapy treatment.  9. Skin dryness: Continue Eucerin cream to apply to affected area as needed. This is improved at this time.  10. Hypokalemia: Repeat potassium is pending from today and we'll continue to monitor.   11. Mild renal insufficiency: His creatinine have increased slightly to 1.7 and that will  be repeated today. This could be related to mild dehydration and we'll follow closely.   [facility], MD 6/12/201511:55 AM

## 2014-01-02 NOTE — Telephone Encounter (Signed)
, °

## 2014-01-02 NOTE — Telephone Encounter (Signed)
Per staff phone call and POF I have schedueld appts. Scheduler advised of appts.  JMW  

## 2014-01-02 NOTE — Patient Instructions (Signed)
[facility] Discharge Instructions for Patients Receiving Chemotherapy  Today you received the following chemotherapy agents ABVD for adriamycin, vinblastin, bleomycin, DTIC  To help prevent nausea and vomiting after your treatment, we encourage you to take your nausea medication IF NEEDED   If you develop nausea and vomiting that is not controlled by your nausea medication, call the clinic.   BELOW ARE SYMPTOMS THAT SHOULD BE REPORTED IMMEDIATELY:  *FEVER GREATER THAN 100.5 F  *CHILLS WITH OR WITHOUT FEVER  NAUSEA AND VOMITING THAT IS NOT CONTROLLED WITH YOUR NAUSEA MEDICATION  *UNUSUAL SHORTNESS OF BREATH  *UNUSUAL BRUISING OR BLEEDING  TENDERNESS IN MOUTH AND THROAT WITH OR WITHOUT PRESENCE OF ULCERS  *URINARY PROBLEMS  *BOWEL PROBLEMS  UNUSUAL RASH Items with * indicate a potential emergency and should be followed up as soon as possible.  Feel free to call the clinic you have any questions or concerns. The clinic phone number is [phone].

## 2014-01-03 ENCOUNTER — Ambulatory Visit (HOSPITAL_BASED_OUTPATIENT_CLINIC_OR_DEPARTMENT_OTHER): Payer: Medicare Other

## 2014-01-03 ENCOUNTER — Ambulatory Visit: Payer: Medicare Other

## 2014-01-03 VITALS — BP 90/56 | HR 103 | Temp 98.1°F

## 2014-01-03 DIAGNOSIS — Z5189 Encounter for other specified aftercare: Secondary | ICD-10-CM

## 2014-01-03 DIAGNOSIS — C8198 Hodgkin lymphoma, unspecified, lymph nodes of multiple sites: Secondary | ICD-10-CM

## 2014-01-03 DIAGNOSIS — C8190 Hodgkin lymphoma, unspecified, unspecified site: Secondary | ICD-10-CM

## 2014-01-03 MED ORDER — PEGFILGRASTIM INJECTION 6 MG/0.6ML
6.0000 mg | Freq: Once | SUBCUTANEOUS | Status: AC
  Administered 2014-01-03: 6 mg via SUBCUTANEOUS

## 2014-01-15 ENCOUNTER — Other Ambulatory Visit: Payer: Self-pay | Admitting: Medical Oncology

## 2014-01-16 ENCOUNTER — Ambulatory Visit (HOSPITAL_BASED_OUTPATIENT_CLINIC_OR_DEPARTMENT_OTHER): Payer: Medicare Other | Admitting: Physician Assistant

## 2014-01-16 ENCOUNTER — Other Ambulatory Visit: Payer: Medicare Other

## 2014-01-16 ENCOUNTER — Ambulatory Visit (HOSPITAL_BASED_OUTPATIENT_CLINIC_OR_DEPARTMENT_OTHER): Payer: Medicare Other

## 2014-01-16 ENCOUNTER — Telehealth: Payer: Self-pay | Admitting: Oncology

## 2014-01-16 ENCOUNTER — Encounter: Payer: Self-pay | Admitting: Physician Assistant

## 2014-01-16 ENCOUNTER — Other Ambulatory Visit (HOSPITAL_BASED_OUTPATIENT_CLINIC_OR_DEPARTMENT_OTHER): Payer: Medicare Other

## 2014-01-16 VITALS — BP 84/54 | HR 104 | Temp 98.2°F | Resp 18 | Ht 73.0 in | Wt 135.9 lb

## 2014-01-16 DIAGNOSIS — C8198 Hodgkin lymphoma, unspecified, lymph nodes of multiple sites: Secondary | ICD-10-CM

## 2014-01-16 DIAGNOSIS — N289 Disorder of kidney and ureter, unspecified: Secondary | ICD-10-CM

## 2014-01-16 DIAGNOSIS — C8190 Hodgkin lymphoma, unspecified, unspecified site: Secondary | ICD-10-CM

## 2014-01-16 DIAGNOSIS — D696 Thrombocytopenia, unspecified: Secondary | ICD-10-CM

## 2014-01-16 DIAGNOSIS — Z5111 Encounter for antineoplastic chemotherapy: Secondary | ICD-10-CM

## 2014-01-16 DIAGNOSIS — E876 Hypokalemia: Secondary | ICD-10-CM | POA: Diagnosis not present

## 2014-01-16 DIAGNOSIS — B20 Human immunodeficiency virus [HIV] disease: Secondary | ICD-10-CM

## 2014-01-16 LAB — COMPREHENSIVE METABOLIC PANEL (CC13)
ALK PHOS: 147 U/L (ref 40–150)
ALT: 13 U/L (ref 0–55)
AST: 15 U/L (ref 5–34)
Albumin: 3.2 g/dL — ABNORMAL LOW (ref 3.5–5.0)
Anion Gap: 10 mEq/L (ref 3–11)
BUN: 15.8 mg/dL (ref 7.0–26.0)
CO2: 20 mEq/L — ABNORMAL LOW (ref 22–29)
Calcium: 9 mg/dL (ref 8.4–10.4)
Chloride: 109 mEq/L (ref 98–109)
Creatinine: 1.6 mg/dL — ABNORMAL HIGH (ref 0.7–1.3)
Glucose: 112 mg/dl (ref 70–140)
Potassium: 3.4 mEq/L — ABNORMAL LOW (ref 3.5–5.1)
SODIUM: 139 meq/L (ref 136–145)
Total Bilirubin: 0.22 mg/dL (ref 0.20–1.20)
Total Protein: 7.4 g/dL (ref 6.4–8.3)

## 2014-01-16 LAB — CBC WITH DIFFERENTIAL/PLATELET
BASO%: 0.8 % (ref 0.0–2.0)
Basophils Absolute: 0 10*3/uL (ref 0.0–0.1)
EOS%: 0.3 % (ref 0.0–7.0)
Eosinophils Absolute: 0 10*3/uL (ref 0.0–0.5)
HCT: 32.6 % — ABNORMAL LOW (ref 38.4–49.9)
HGB: 10.8 g/dL — ABNORMAL LOW (ref 13.0–17.1)
LYMPH%: 15 % (ref 14.0–49.0)
MCH: 35.6 pg — AB (ref 27.2–33.4)
MCHC: 33.2 g/dL (ref 32.0–36.0)
MCV: 107.3 fL — AB (ref 79.3–98.0)
MONO#: 0.4 10*3/uL (ref 0.1–0.9)
MONO%: 11.5 % (ref 0.0–14.0)
NEUT%: 72.4 % (ref 39.0–75.0)
NEUTROS ABS: 2.8 10*3/uL (ref 1.5–6.5)
Platelets: 161 10*3/uL (ref 140–400)
RBC: 3.03 10*6/uL — AB (ref 4.20–5.82)
RDW: 16.7 % — AB (ref 11.0–14.6)
WBC: 3.8 10*3/uL — ABNORMAL LOW (ref 4.0–10.3)
lymph#: 0.6 10*3/uL — ABNORMAL LOW (ref 0.9–3.3)

## 2014-01-16 MED ORDER — VINBLASTINE SULFATE CHEMO INJECTION 1 MG/ML
6.0000 mg/m2 | Freq: Once | INTRAVENOUS | Status: AC
  Administered 2014-01-16: 10 mg via INTRAVENOUS
  Filled 2014-01-16: qty 10

## 2014-01-16 MED ORDER — DACARBAZINE 200 MG IV SOLR
375.0000 mg/m2 | Freq: Once | INTRAVENOUS | Status: AC
  Administered 2014-01-16: 600 mg via INTRAVENOUS
  Filled 2014-01-16: qty 30

## 2014-01-16 MED ORDER — SODIUM CHLORIDE 0.9 % IV SOLN
Freq: Once | INTRAVENOUS | Status: AC
  Administered 2014-01-16: 10:00:00 via INTRAVENOUS

## 2014-01-16 MED ORDER — ONDANSETRON 16 MG/50ML IVPB (CHCC)
INTRAVENOUS | Status: AC
  Filled 2014-01-16: qty 16

## 2014-01-16 MED ORDER — SODIUM CHLORIDE 0.9 % IV SOLN
10.0000 [IU]/m2 | Freq: Once | INTRAVENOUS | Status: AC
  Administered 2014-01-16: 16 [IU] via INTRAVENOUS
  Filled 2014-01-16: qty 5.33

## 2014-01-16 MED ORDER — DEXAMETHASONE SODIUM PHOSPHATE 20 MG/5ML IJ SOLN
INTRAMUSCULAR | Status: AC
  Filled 2014-01-16: qty 5

## 2014-01-16 MED ORDER — ONDANSETRON 16 MG/50ML IVPB (CHCC)
16.0000 mg | Freq: Once | INTRAVENOUS | Status: AC
  Administered 2014-01-16: 16 mg via INTRAVENOUS

## 2014-01-16 MED ORDER — DEXAMETHASONE SODIUM PHOSPHATE 20 MG/5ML IJ SOLN
20.0000 mg | Freq: Once | INTRAMUSCULAR | Status: AC
  Administered 2014-01-16: 20 mg via INTRAVENOUS

## 2014-01-16 MED ORDER — DOXORUBICIN HCL CHEMO IV INJECTION 2 MG/ML
25.0000 mg/m2 | Freq: Once | INTRAVENOUS | Status: AC
  Administered 2014-01-16: 40 mg via INTRAVENOUS
  Filled 2014-01-16: qty 20

## 2014-01-16 MED ORDER — SODIUM CHLORIDE 0.9 % IJ SOLN
10.0000 mL | INTRAMUSCULAR | Status: DC | PRN
  Administered 2014-01-16: 10 mL
  Filled 2014-01-16: qty 10

## 2014-01-16 MED ORDER — HEPARIN SOD (PORK) LOCK FLUSH 100 UNIT/ML IV SOLN
500.0000 [IU] | Freq: Once | INTRAVENOUS | Status: AC | PRN
  Administered 2014-01-16: 500 [IU]
  Filled 2014-01-16: qty 5

## 2014-01-16 NOTE — Progress Notes (Signed)
Adrena, PA aware of creatinine of 1.6 and states okay to proceed with treatment.

## 2014-01-16 NOTE — Progress Notes (Signed)
Hematology and Oncology Follow Up Visit  Thomas [name] [id number] [birth year] 33 y.o. 01/16/2014 10:52 AM   Principle Diagnosis:  33 year old HIV positive gentleman diagnosed with Hodgkin's disease in January of 2015 after he presented with anemia and diffuse lymphadenopathy. Stage IIIA disease.    Prior Therapy: He is status post supraclavicular right lymph node biopsy done on 08/02/2013.  Current therapy: Systemic chemotherapy in the form of ABVD started on 10/03/2013.  Interim History:  Mr. Tooker presents today for a followup visit by himself. Since his last visit, he continues to do very well. His appetite is fair but he has lost a little weight since his last office visit.  His energy level remains good as does his quality of life. He is tolerating his chemotherapy without complications. He denies any nausea, vomiting or abdominal pain. He does not report any chest pain or shortness of breath. He denies fevers or chills or sweats. Denied pruritus or any constitutional symptoms. He denies any lymphadenopathy. Denied hematochezia or melena or any bleeding problems. Has not reported any recent hospitalizations or illnesses. He does not report any headaches or blurry vision or double vision. Did not report any alteration of mental status her psychiatric issues. Does not report any syncope or seizures. Does not report any chest pain palpitation or difficulty breathing. Does not report any leg edema or orthopnea. He does not report any abdominal distention or early satiety. Does not report any ascites. Does not report any musculoskeletal complaints. Does not report any arthralgias or myalgias or skin rashes or lymphadenopathy. Denies petechiae rashes or bleeding.  Medications: I have reviewed the patient's current medications. Unchanged by my review today Current Outpatient Prescriptions  Medication Sig Dispense Refill  . atenolol (TENORMIN) 25 MG tablet Take 25 mg by mouth every morning.      Marland Kitchen  efavirenz-emtricitabine-tenofovir (ATRIPLA) 600-200-300 MG per tablet Take 1 tablet by mouth at bedtime.      . ferrous sulfate 325 (65 FE) MG tablet Take 1 tablet (325 mg total) by mouth 2 (two) times daily with a meal.  60 tablet  5  . lidocaine-prilocaine (EMLA) cream APPLY TOPICALLY TO PORT AS DIRECTED.  1 each  0  . pantoprazole (PROTONIX) 40 MG tablet Take 1 tablet (40 mg total) by mouth 2 (two) times daily before a meal.  30 tablet  0  . prochlorperazine (COMPAZINE) 10 MG tablet Take 1 tablet (10 mg total) by mouth every 6 (six) hours as needed for nausea or vomiting.  30 tablet  0  . Skin Protectants, Misc. (EUCERIN) cream Apply to Skin twice a day.  397 g  0   No current facility-administered medications for this visit.   Facility-Administered Medications Ordered in Other Visits  Medication Dose Route Frequency Provider Last Rate Last Dose  . bleomycin (BLEOCIN) 16 Units in sodium chloride 0.9 % 50 mL chemo infusion  10 Units/m2 (Treatment Plan Actual) Intravenous Once Wyatt Portela, MD      . dacarbazine (DTIC) 600 mg in sodium chloride 0.9 % 250 mL chemo infusion  375 mg/m2 (Treatment Plan Actual) Intravenous Once Wyatt Portela, MD      . heparin lock flush 100 unit/mL  500 Units Intracatheter Once PRN Wyatt Portela, MD      . sodium chloride 0.9 % injection 10 mL  10 mL Intravenous PRN Wyatt Portela, MD   10 mL at 10/31/13 1601  . sodium chloride 0.9 % injection 10 mL  10 mL Intracatheter  PRN Wyatt Portela, MD         Allergies:  Allergies  Allergen Reactions  . Tylenol [Acetaminophen]     sweats    Past Medical History, Surgical history, Social history, and Family History were reviewed and updated.  Review of Systems:  Remaining ROS negative.  Physical Exam: Blood pressure 84/54, pulse 104, temperature 98.2 F (36.8 C), temperature source Oral, resp. rate 18, height 6\' 1"  (1.854 m), weight 135 lb 14.4 oz (61.644 kg), SpO2 100.00%.  ECOG: 1  General appearance:  alert and cooperative much healthier appearing gentleman than previously.  Head: Normocephalic, without obvious abnormality, atraumatic Neck: no carotid bruit no thyroid masses or lymphadenopathy. Lymph nodes: No lymphadenopathy noted.  Heart:regular rate and rhythm, S1, S2 normal, no murmur, click, rub or gallop Lung:chest clear, no wheezing, rales, normal symmetric air entry Abdomin: soft, non-tender, without masses or organomegaly EXT:no erythema, induration, or nodules Skin: Dry skin noted. Neurological exam: No motor or since deficits.    Lab Results: Lab Results  Component Value Date   WBC 3.8* 01/16/2014   HGB 10.8* 01/16/2014   HCT 32.6* 01/16/2014   MCV 107.3* 01/16/2014   PLT 161 01/16/2014     Chemistry      Component Value Date/Time   NA 139 01/16/2014 0839   NA 141 08/04/2013 0339   K 3.4* 01/16/2014 0839   K 3.6* 08/04/2013 0339   CL 111 08/04/2013 0339   CO2 20* 01/16/2014 0839   CO2 20 08/04/2013 0339   BUN 15.8 01/16/2014 0839   BUN 12 08/04/2013 0339   CREATININE 1.6* 01/16/2014 0839   CREATININE 0.71 08/04/2013 0339   CREATININE 0.85 06/17/2013 1658      Component Value Date/Time   CALCIUM 9.0 01/16/2014 0839   CALCIUM 7.5* 08/04/2013 0339   ALKPHOS 147 01/16/2014 0839   ALKPHOS 123* 08/02/2013 1620   AST 15 01/16/2014 0839   AST 17 08/02/2013 1620   ALT 13 01/16/2014 0839   ALT 7 08/02/2013 1620   BILITOT 0.22 01/16/2014 0839   BILITOT 0.3 08/02/2013 1620      Impression and Plan:  33 year old gentleman with the following issues:  1. Hodgkin's disease presented with lymphadenopathy above and below the diaphragm as well as bone marrow involvement without any constitutional symptoms indicating at least stage IIIA. He is status post 3 cycles of ABVD, as well as day 1 of cycle#4,  with a PET/CT scan on 12/10/2013 showed near-complete response. The plan is to continue with the current treatment of 6 cycles. He continues to tolerate chemotherapy without any complications  and continues to have excellent clinical response.   2. Anemia: This is undoubtedly related to his Hodgkin's disease. His hemoglobin has improved, remains stable and does not require any intervention.  3. IV access: Port-A-Cath has already been inserted and is utilizing EMLA cream. No complications noted to  4. Nausea prophylaxis: A prescription for anti-emetics is available for him to take. But has not been an issue at this time.  5. HIV: He is on Atripla and followed by infectious disease service.  6. Thrombocytopenia: Likely related to his malignancy involvement of the bone marrow. His count has normalized now.  7. Followup: Will be 6/26 before his next chemotherapy cycle which will be day 8 of cycle 4.  8. Neutropenic prophylaxis: To receive Neulasta after each chemotherapy treatment.  9. Skin dryness: Continue Eucerin cream to apply to affected area as needed. This is improved at this time.  10. Hypokalemia: Repeat potassium is slightly low today at 3.4,. Patient is encouraged to increase his dietary intake of potassium rich foods.  11. Mild renal insufficiency: His creatinine have increased slightly to 1.6 and that will  be repeated today. This could be related to mild dehydration and we'll follow closely.   Wynetta Emery, ADRENA E, PA-C  6/26/201510:52 AM

## 2014-01-16 NOTE — Patient Instructions (Signed)
Continue labs and chemotherapy as scheduled Followup in 2 weeks prior to the start of your next scheduled cycle of chemotherapy 

## 2014-01-16 NOTE — Patient Instructions (Addendum)
[facility] Discharge Instructions for Patients Receiving Chemotherapy  Today you received the following chemotherapy agents Adriamycin, Vinblastine, Bleomycin, Dacarbazine.  To help prevent nausea and vomiting after your treatment, we encourage you to take your nausea medication, Compazine, every 6 hours as needed.   If you develop nausea and vomiting that is not controlled by your nausea medication, call the clinic.   BELOW ARE SYMPTOMS THAT SHOULD BE REPORTED IMMEDIATELY:  *FEVER GREATER THAN 100.5 F  *CHILLS WITH OR WITHOUT FEVER  NAUSEA AND VOMITING THAT IS NOT CONTROLLED WITH YOUR NAUSEA MEDICATION  *UNUSUAL SHORTNESS OF BREATH  *UNUSUAL BRUISING OR BLEEDING  TENDERNESS IN MOUTH AND THROAT WITH OR WITHOUT PRESENCE OF ULCERS  *URINARY PROBLEMS  *BOWEL PROBLEMS  UNUSUAL RASH Items with * indicate a potential emergency and should be followed up as soon as possible.  Feel free to call the clinic if you have any questions or concerns. The clinic phone number is [phone].

## 2014-01-16 NOTE — Telephone Encounter (Signed)
gv adn prnted appt appt sched adn avs....sed added tx.

## 2014-01-17 ENCOUNTER — Telehealth: Payer: Self-pay | Admitting: Medical Oncology

## 2014-01-17 ENCOUNTER — Encounter (HOSPITAL_COMMUNITY): Payer: Self-pay | Admitting: Emergency Medicine

## 2014-01-17 ENCOUNTER — Ambulatory Visit: Payer: Medicare Other

## 2014-01-17 ENCOUNTER — Inpatient Hospital Stay (HOSPITAL_COMMUNITY)
Admission: EM | Admit: 2014-01-17 | Discharge: 2014-01-22 | DRG: 974 | Disposition: A | Discharge status: Home / Self Care | Payer: Medicare Other | Attending: Internal Medicine | Admitting: Internal Medicine

## 2014-01-17 DIAGNOSIS — D709 Neutropenia, unspecified: Secondary | ICD-10-CM | POA: Diagnosis present

## 2014-01-17 DIAGNOSIS — Z87442 Personal history of urinary calculi: Secondary | ICD-10-CM | POA: Diagnosis not present

## 2014-01-17 DIAGNOSIS — R5081 Fever presenting with conditions classified elsewhere: Secondary | ICD-10-CM | POA: Diagnosis present

## 2014-01-17 DIAGNOSIS — B20 Human immunodeficiency virus [HIV] disease: Secondary | ICD-10-CM | POA: Diagnosis not present

## 2014-01-17 DIAGNOSIS — J9600 Acute respiratory failure, unspecified whether with hypoxia or hypercapnia: Secondary | ICD-10-CM | POA: Diagnosis present

## 2014-01-17 DIAGNOSIS — N19 Unspecified kidney failure: Secondary | ICD-10-CM | POA: Diagnosis not present

## 2014-01-17 DIAGNOSIS — N1330 Unspecified hydronephrosis: Secondary | ICD-10-CM | POA: Diagnosis present

## 2014-01-17 DIAGNOSIS — D509 Iron deficiency anemia, unspecified: Secondary | ICD-10-CM | POA: Diagnosis not present

## 2014-01-17 DIAGNOSIS — R6521 Severe sepsis with septic shock: Secondary | ICD-10-CM

## 2014-01-17 DIAGNOSIS — E876 Hypokalemia: Secondary | ICD-10-CM | POA: Diagnosis present

## 2014-01-17 DIAGNOSIS — R0602 Shortness of breath: Secondary | ICD-10-CM | POA: Diagnosis not present

## 2014-01-17 DIAGNOSIS — Z21 Asymptomatic human immunodeficiency virus [HIV] infection status: Secondary | ICD-10-CM | POA: Diagnosis not present

## 2014-01-17 DIAGNOSIS — Z87891 Personal history of nicotine dependence: Secondary | ICD-10-CM | POA: Diagnosis not present

## 2014-01-17 DIAGNOSIS — R404 Transient alteration of awareness: Secondary | ICD-10-CM | POA: Diagnosis not present

## 2014-01-17 DIAGNOSIS — E872 Acidosis, unspecified: Secondary | ICD-10-CM | POA: Diagnosis present

## 2014-01-17 DIAGNOSIS — J9601 Acute respiratory failure with hypoxia: Secondary | ICD-10-CM

## 2014-01-17 DIAGNOSIS — Z79899 Other long term (current) drug therapy: Secondary | ICD-10-CM | POA: Diagnosis not present

## 2014-01-17 DIAGNOSIS — E869 Volume depletion, unspecified: Secondary | ICD-10-CM | POA: Diagnosis not present

## 2014-01-17 DIAGNOSIS — R5381 Other malaise: Secondary | ICD-10-CM | POA: Diagnosis not present

## 2014-01-17 DIAGNOSIS — D61810 Antineoplastic chemotherapy induced pancytopenia: Secondary | ICD-10-CM | POA: Diagnosis present

## 2014-01-17 DIAGNOSIS — E8809 Other disorders of plasma-protein metabolism, not elsewhere classified: Secondary | ICD-10-CM | POA: Diagnosis present

## 2014-01-17 DIAGNOSIS — D61818 Other pancytopenia: Secondary | ICD-10-CM

## 2014-01-17 DIAGNOSIS — N179 Acute kidney failure, unspecified: Secondary | ICD-10-CM | POA: Diagnosis not present

## 2014-01-17 DIAGNOSIS — R918 Other nonspecific abnormal finding of lung field: Secondary | ICD-10-CM | POA: Diagnosis not present

## 2014-01-17 DIAGNOSIS — N200 Calculus of kidney: Secondary | ICD-10-CM | POA: Diagnosis not present

## 2014-01-17 DIAGNOSIS — R112 Nausea with vomiting, unspecified: Secondary | ICD-10-CM

## 2014-01-17 DIAGNOSIS — E860 Dehydration: Secondary | ICD-10-CM | POA: Diagnosis not present

## 2014-01-17 DIAGNOSIS — E43 Unspecified severe protein-calorie malnutrition: Secondary | ICD-10-CM | POA: Diagnosis present

## 2014-01-17 DIAGNOSIS — C8190 Hodgkin lymphoma, unspecified, unspecified site: Secondary | ICD-10-CM | POA: Diagnosis not present

## 2014-01-17 DIAGNOSIS — T451X5A Adverse effect of antineoplastic and immunosuppressive drugs, initial encounter: Secondary | ICD-10-CM | POA: Diagnosis present

## 2014-01-17 DIAGNOSIS — Z4682 Encounter for fitting and adjustment of non-vascular catheter: Secondary | ICD-10-CM | POA: Diagnosis not present

## 2014-01-17 DIAGNOSIS — I959 Hypotension, unspecified: Secondary | ICD-10-CM | POA: Diagnosis not present

## 2014-01-17 DIAGNOSIS — A419 Sepsis, unspecified organism: Secondary | ICD-10-CM | POA: Diagnosis not present

## 2014-01-17 DIAGNOSIS — R197 Diarrhea, unspecified: Secondary | ICD-10-CM

## 2014-01-17 DIAGNOSIS — K529 Noninfective gastroenteritis and colitis, unspecified: Secondary | ICD-10-CM | POA: Diagnosis present

## 2014-01-17 DIAGNOSIS — R6520 Severe sepsis without septic shock: Secondary | ICD-10-CM | POA: Diagnosis not present

## 2014-01-17 DIAGNOSIS — R5383 Other fatigue: Secondary | ICD-10-CM | POA: Diagnosis not present

## 2014-01-17 DIAGNOSIS — J189 Pneumonia, unspecified organism: Secondary | ICD-10-CM | POA: Diagnosis not present

## 2014-01-17 DIAGNOSIS — R579 Shock, unspecified: Secondary | ICD-10-CM | POA: Diagnosis not present

## 2014-01-17 HISTORY — DX: Malignant (primary) neoplasm, unspecified: C80.1

## 2014-01-17 MED ORDER — SODIUM CHLORIDE 0.9 % IV SOLN
1000.0000 mL | Freq: Once | INTRAVENOUS | Status: AC
  Administered 2014-01-17: 1000 mL via INTRAVENOUS

## 2014-01-17 MED ORDER — ONDANSETRON HCL 4 MG/2ML IJ SOLN
4.0000 mg | Freq: Once | INTRAMUSCULAR | Status: AC
  Administered 2014-01-17: 4 mg via INTRAVENOUS
  Filled 2014-01-17: qty 2

## 2014-01-17 MED ORDER — SODIUM CHLORIDE 0.9 % IV SOLN
1000.0000 mL | INTRAVENOUS | Status: DC
  Administered 2014-01-18 – 2014-01-21 (×7): 1000 mL via INTRAVENOUS

## 2014-01-17 MED ORDER — IBUPROFEN 800 MG PO TABS
800.0000 mg | ORAL_TABLET | Freq: Once | ORAL | Status: AC
  Administered 2014-01-18: 800 mg via ORAL
  Filled 2014-01-17: qty 1

## 2014-01-17 MED ORDER — LIDOCAINE-PRILOCAINE 2.5-2.5 % EX CREA
TOPICAL_CREAM | Freq: Once | CUTANEOUS | Status: AC
  Administered 2014-01-17: via TOPICAL
  Filled 2014-01-17: qty 5

## 2014-01-17 NOTE — Telephone Encounter (Signed)
I called pt because he did not show up for his Neulasta appt today. Thomas [name] told me  he has been sick with diarrhea since yesterday after he got home from chemo. He said he was up all night with diarrhea. He took one Imodium this am and vomited it up because " I didn't have anything to eat": He ate a biscuit and then took another Imodium just now and it has stayed down. I spoke to his   mother and asked her if she can bring her here for fluids and she said "no" ( she does not have any transportation) and she cancelled his transportation for today because [name] told her he did not feel good enough to come in . I instructed Thomas [name] and his mother  that he needs to drink a lot of fluid today , water, Gatorade, etc.  He said he would. He denied having nausea, just diarrhea. I also told his mother he needs to go to ED if his diarrhea starts up again, does not feel any better ,  feels weaker or has any fever , vomiting. She said she would call 911 if this happened and have EMS take him to ED . I also told her to call Monday regarding getting this Neulasta.

## 2014-01-17 NOTE — Progress Notes (Signed)
Did not show up for injection.

## 2014-01-17 NOTE — ED Notes (Signed)
Per RN, blood work will be drawn from pt's port

## 2014-01-17 NOTE — ED Notes (Signed)
Per EMS pt had chemo at 0900 01/16/14 at 1400 pt became weak, N/V/D.  In field pt was hypotensive.  Pt denies pain.

## 2014-01-17 NOTE — ED Notes (Signed)
Pt is asking about receiving Neupogen shot here in ED as he was to sick to make it to the [facility] center today to get it.

## 2014-01-17 NOTE — ED Notes (Signed)
Bed: AY30 Expected date:  Expected time:  Means of arrival:  Comments: EMS/hypotensive

## 2014-01-17 NOTE — ED Provider Notes (Signed)
CSN: [id number]     Arrival date & time 01/17/14  2227 History   First MD Initiated Contact with Patient 01/17/14 2245     Chief Complaint  Patient presents with  . Hypotension     (Consider location/radiation/quality/duration/timing/severity/associated sxs/prior Treatment) The history is provided by the patient and medical records.   This is a 33 y.o. M with PMH significant for HIV, Hodgkins Lymphoma currently undergoing chemotherapy (ABVD) by Dr. [name], presenting to the ED for N/V/D onset yesterday afternoon.  Patient states he went to his chemotherapy yesterday morning at 0900 and upon returning home at 1400 felt extremely weak and fatigued.  States he continues having watery diarrhea throughout the night and was unable to get much sleep.  States today he has been having nausea and non-bloody, non-bilious emesis throughout the day today.  He has had minimal PO intake-- water and chicken broth only.  Endorses subjective fever and chills.  Was unable to make it in to cancer center today to get his neupogen injection today.  Denies abdominal pain, chest pain, SOB, palpitations, dizziness, or lightheadedness.  En route to hospital with EMS, pt was hypotensive.  On arrival to ED, pt remains hypotensive and febrile with rectal temp of 102.8F.  Last CD4 count 360 on medical records from April.  Past Medical History  Diagnosis Date  . HIV disease 06/25/2012  . Skin lesion 07/03/2012  . Leg pain 07/03/2012  . Sinus tachycardia 08/06/2012  . Penile cyst 08/06/2012  . Cancer    Past Surgical History  Procedure Laterality Date  . 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  this after several previous I and Ds of rectal abcess.   [name] node biopsy Right 08/08/2013    Procedure: SUPRACLAVICULAR LYMPH NODE BIOPSY;  Surgeon: Gaye Pollack, MD;  Location: [facility];  Service: Thoracic;  Laterality: Right;  . 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 No pertinent family history. History  Substance Use Topics  . Smoking status: Former Smoker -- 1.00 packs/day for 15 years    Types: Cigarettes    Start date: 08/24/2013  . Smokeless tobacco: Never Used     Comment: quitline info, counseling  . Alcohol Use: No    Review of Systems  Gastrointestinal: Positive for nausea, vomiting and diarrhea.  All other systems reviewed and are negative.    Allergies  Tylenol  Home Medications   Prior to Admission medications   Medication Sig Start Date End Date Taking? Authorizing Provider  atenolol (TENORMIN) 25 MG tablet Take 25 mg by mouth every morning.    Historical Provider, MD  efavirenz-emtricitabine-tenofovir (ATRIPLA) [phone] MG per tablet Take 1 tablet by mouth at bedtime.    Historical Provider, MD  ferrous sulfate 325 (65 FE) MG tablet Take 1 tablet (325 mg total) by mouth 2 (two) times daily with a meal. 10/21/13   [name], MD  lidocaine-prilocaine (EMLA) cream APPLY TOPICALLY TO PORT AS DIRECTED. 10/21/13   Wyatt Portela, MD  pantoprazole (PROTONIX) 40 MG tablet Take 1 tablet (40 mg total) by mouth 2 (two) times daily before a meal. 08/11/13   Maryann Mikhail, DO  prochlorperazine (COMPAZINE) 10 MG tablet Take 1 tablet (10 mg total) by mouth every 6 (six) hours as needed for nausea or vomiting. 11/20/13   Wyatt Portela, MD  Skin Protectants, Misc. (EUCERIN) cream Apply to Skin twice a day. 10/31/13  Wyatt Portela, MD   BP 74/36  Temp(Src) 102.3 F (39.1 C) (Rectal)  Resp 16  Physical Exam  Nursing note and vitals reviewed. Constitutional: He is oriented to person, place, and time. He appears well-developed and well-nourished. No distress.  Thin, chronically ill appearing, shivering  HENT:  Head: Normocephalic and atraumatic.  Mouth/Throat: Oropharynx is clear and moist.  Dry mucous membranes  Eyes: Conjunctivae and EOM are normal. Pupils are equal, round, and reactive to light.  Neck:  Normal range of motion. Neck supple.  Cardiovascular: Normal rate, regular rhythm and normal heart sounds.   Pulmonary/Chest: Effort normal and breath sounds normal. No respiratory distress. He has no wheezes.  Abdominal: Soft. Bowel sounds are normal. There is no tenderness. There is no guarding.  Abdomen soft, non-distended, no peritoneal signs  Musculoskeletal: Normal range of motion. He exhibits no edema.  Neurological: He is alert and oriented to person, place, and time.  Skin: Skin is warm and dry. He is not diaphoretic.  Psychiatric: He has a normal mood and affect.    ED Course  Procedures (including critical care time)  CRITICAL CARE Performed by: Larene Pickett   Total critical care time: 45  Critical care time was exclusive of separately billable procedures and treating other patients.  Critical care was necessary to treat or prevent imminent or life-threatening deterioration.  Critical care was time spent personally by me on the following activities: development of treatment plan with patient and/or surrogate as well as nursing, discussions with consultants, evaluation of patient's response to treatment, examination of patient, obtaining history from patient or surrogate, ordering and performing treatments and interventions, ordering and review of laboratory studies, ordering and review of radiographic studies, pulse oximetry and re-evaluation of patient's condition.  Labs Review Labs Reviewed  CBC WITH DIFFERENTIAL - Abnormal; Notable for the following:    WBC 2.0 (*)    RBC 2.41 (*)    Hemoglobin 8.5 (*)    HCT 24.0 (*)    MCH 35.3 (*)    RDW 16.2 (*)    Platelets 22 (*)    Neutro Abs 1.5 (*)    Lymphs Abs 0.4 (*)    All other components within normal limits  COMPREHENSIVE METABOLIC PANEL - Abnormal; Notable for the following:    Sodium 129 (*)    CO2 12 (*)    BUN 61 (*)    Creatinine, Ser 5.12 (*)    Calcium 7.1 (*)    Albumin 2.8 (*)    AST 121 (*)     ALT 123 (*)    Alkaline Phosphatase 119 (*)    GFR calc non Af Amer 14 (*)    GFR calc Af Amer 16 (*)    All other components within normal limits  CULTURE, BLOOD (ROUTINE X 2)  CULTURE, BLOOD (ROUTINE X 2)  URINE CULTURE  URINALYSIS, ROUTINE W REFLEX MICROSCOPIC  I-STAT CG4 LACTIC ACID, ED    Imaging Review No results found.   EKG Interpretation None      MDM   Final diagnoses:  Neutropenic fever  Renal failure  Hodgkin's lymphoma  HIV (human immunodeficiency virus infection)  Nausea vomiting and diarrhea   33 y.o. Immunocompromised male with HIV and Hodgkins Lymphoma presenting to the ED for weakness, N/V/D after chemo tx yesterday morning.  Pt febrile on arrival, rectal temp 102.75F, hypotensive at 74/36.  Labs pending at this time.  Pts port accessed and peripheral IV line with IVFB started.  Motrin  and zofran given.  Labs with pancytopenia-- WBC decreased from 3.8 to 2.0 in 24 hours, platelet count now 22, down from 161 yesterday without any source of active bleeding.  Pt with renal failure with SrCr of 5.12 compared with 1.6 yesterday.  BP with slight improvement after fluids.  Concern for impending sepsis given immunocompromised state.  Lactic acid, urine, urine culture, blood cultures x2, and CXR pending.  Pt started on broad spectrum vancomycin and zosyn.  Pt placed on neutropenic precautions.  BP now improved to 92/35 after 2L.  Pt has 3rd and 4th liter of IVFBs currently infusing.  Will plan for admission to step-down unit.  Larene Pickett, PA-C 01/18/14 [phone]

## 2014-01-18 ENCOUNTER — Inpatient Hospital Stay (HOSPITAL_COMMUNITY): Payer: Medicare Other

## 2014-01-18 ENCOUNTER — Emergency Department (HOSPITAL_COMMUNITY): Payer: Medicare Other

## 2014-01-18 ENCOUNTER — Other Ambulatory Visit: Payer: Self-pay

## 2014-01-18 DIAGNOSIS — D709 Neutropenia, unspecified: Secondary | ICD-10-CM | POA: Diagnosis not present

## 2014-01-18 DIAGNOSIS — D61810 Antineoplastic chemotherapy induced pancytopenia: Secondary | ICD-10-CM | POA: Diagnosis present

## 2014-01-18 DIAGNOSIS — N179 Acute kidney failure, unspecified: Secondary | ICD-10-CM | POA: Diagnosis not present

## 2014-01-18 DIAGNOSIS — E872 Acidosis, unspecified: Secondary | ICD-10-CM | POA: Diagnosis present

## 2014-01-18 DIAGNOSIS — R6521 Severe sepsis with septic shock: Secondary | ICD-10-CM

## 2014-01-18 DIAGNOSIS — E869 Volume depletion, unspecified: Secondary | ICD-10-CM | POA: Diagnosis not present

## 2014-01-18 DIAGNOSIS — Z4682 Encounter for fitting and adjustment of non-vascular catheter: Secondary | ICD-10-CM | POA: Diagnosis not present

## 2014-01-18 DIAGNOSIS — R112 Nausea with vomiting, unspecified: Secondary | ICD-10-CM | POA: Diagnosis not present

## 2014-01-18 DIAGNOSIS — R6520 Severe sepsis without septic shock: Secondary | ICD-10-CM | POA: Diagnosis present

## 2014-01-18 DIAGNOSIS — E876 Hypokalemia: Secondary | ICD-10-CM | POA: Diagnosis present

## 2014-01-18 DIAGNOSIS — A419 Sepsis, unspecified organism: Secondary | ICD-10-CM

## 2014-01-18 DIAGNOSIS — N1330 Unspecified hydronephrosis: Secondary | ICD-10-CM | POA: Diagnosis present

## 2014-01-18 DIAGNOSIS — C8190 Hodgkin lymphoma, unspecified, unspecified site: Secondary | ICD-10-CM | POA: Diagnosis not present

## 2014-01-18 DIAGNOSIS — J189 Pneumonia, unspecified organism: Secondary | ICD-10-CM | POA: Diagnosis not present

## 2014-01-18 DIAGNOSIS — Z79899 Other long term (current) drug therapy: Secondary | ICD-10-CM | POA: Diagnosis not present

## 2014-01-18 DIAGNOSIS — Z87891 Personal history of nicotine dependence: Secondary | ICD-10-CM | POA: Diagnosis not present

## 2014-01-18 DIAGNOSIS — J9600 Acute respiratory failure, unspecified whether with hypoxia or hypercapnia: Secondary | ICD-10-CM | POA: Diagnosis not present

## 2014-01-18 DIAGNOSIS — B20 Human immunodeficiency virus [HIV] disease: Secondary | ICD-10-CM | POA: Diagnosis present

## 2014-01-18 DIAGNOSIS — R0602 Shortness of breath: Secondary | ICD-10-CM | POA: Diagnosis not present

## 2014-01-18 DIAGNOSIS — E8809 Other disorders of plasma-protein metabolism, not elsewhere classified: Secondary | ICD-10-CM | POA: Diagnosis present

## 2014-01-18 DIAGNOSIS — R197 Diarrhea, unspecified: Secondary | ICD-10-CM | POA: Diagnosis not present

## 2014-01-18 DIAGNOSIS — R5081 Fever presenting with conditions classified elsewhere: Secondary | ICD-10-CM | POA: Diagnosis present

## 2014-01-18 DIAGNOSIS — E860 Dehydration: Secondary | ICD-10-CM | POA: Diagnosis not present

## 2014-01-18 DIAGNOSIS — T451X5A Adverse effect of antineoplastic and immunosuppressive drugs, initial encounter: Secondary | ICD-10-CM | POA: Diagnosis present

## 2014-01-18 DIAGNOSIS — R918 Other nonspecific abnormal finding of lung field: Secondary | ICD-10-CM | POA: Diagnosis not present

## 2014-01-18 DIAGNOSIS — E43 Unspecified severe protein-calorie malnutrition: Secondary | ICD-10-CM | POA: Diagnosis present

## 2014-01-18 DIAGNOSIS — Z21 Asymptomatic human immunodeficiency virus [HIV] infection status: Secondary | ICD-10-CM | POA: Diagnosis not present

## 2014-01-18 DIAGNOSIS — D509 Iron deficiency anemia, unspecified: Secondary | ICD-10-CM | POA: Diagnosis not present

## 2014-01-18 DIAGNOSIS — Z87442 Personal history of urinary calculi: Secondary | ICD-10-CM | POA: Diagnosis not present

## 2014-01-18 DIAGNOSIS — I959 Hypotension, unspecified: Secondary | ICD-10-CM | POA: Diagnosis not present

## 2014-01-18 DIAGNOSIS — N200 Calculus of kidney: Secondary | ICD-10-CM | POA: Diagnosis not present

## 2014-01-18 DIAGNOSIS — R579 Shock, unspecified: Secondary | ICD-10-CM | POA: Diagnosis not present

## 2014-01-18 HISTORY — DX: Severe sepsis with septic shock: R65.21

## 2014-01-18 HISTORY — DX: Sepsis, unspecified organism: A41.9

## 2014-01-18 LAB — COMPREHENSIVE METABOLIC PANEL
ALK PHOS: 119 U/L — AB (ref 39–117)
ALT: 123 U/L — ABNORMAL HIGH (ref 0–53)
AST: 121 U/L — ABNORMAL HIGH (ref 0–37)
Albumin: 2.8 g/dL — ABNORMAL LOW (ref 3.5–5.2)
BUN: 61 mg/dL — AB (ref 6–23)
CO2: 12 mEq/L — ABNORMAL LOW (ref 19–32)
Calcium: 7.1 mg/dL — ABNORMAL LOW (ref 8.4–10.5)
Chloride: 97 mEq/L (ref 96–112)
Creatinine, Ser: 5.12 mg/dL — ABNORMAL HIGH (ref 0.50–1.35)
GFR calc non Af Amer: 14 mL/min — ABNORMAL LOW (ref >90)
GFR, EST AFRICAN AMERICAN: 16 mL/min — AB (ref >90)
GLUCOSE: 88 mg/dL (ref 70–99)
Potassium: 4.6 mEq/L (ref 3.7–5.3)
Sodium: 129 mEq/L — ABNORMAL LOW (ref 137–147)
TOTAL PROTEIN: 6.7 g/dL (ref 6.0–8.3)
Total Bilirubin: 0.5 mg/dL (ref 0.3–1.2)

## 2014-01-18 LAB — BLOOD GAS, ARTERIAL
Acid-base deficit: 16.8 mmol/L — ABNORMAL HIGH (ref 0.0–2.0)
Bicarbonate: 8.8 mEq/L — ABNORMAL LOW (ref 20.0–24.0)
DRAWN BY: 23604
O2 CONTENT: 3 L/min
O2 SAT: 82.7 %
Patient temperature: 98.6
TCO2: 8.6 mmol/L (ref 0–100)
pCO2 arterial: 20.4 mmHg — ABNORMAL LOW (ref 35.0–45.0)
pH, Arterial: 7.259 — ABNORMAL LOW (ref 7.350–7.450)
pO2, Arterial: 55.2 mmHg — ABNORMAL LOW (ref 80.0–100.0)

## 2014-01-18 LAB — PROTIME-INR
INR: 1.42 (ref 0.00–1.49)
PROTHROMBIN TIME: 17.4 s — AB (ref 11.6–15.2)

## 2014-01-18 LAB — URINE MICROSCOPIC-ADD ON

## 2014-01-18 LAB — CBC WITH DIFFERENTIAL/PLATELET
BASOS ABS: 0 10*3/uL (ref 0.0–0.1)
Basophils Relative: 1 % (ref 0–1)
EOS ABS: 0 10*3/uL (ref 0.0–0.7)
Eosinophils Relative: 0 % (ref 0–5)
HEMATOCRIT: 24 % — AB (ref 39.0–52.0)
Hemoglobin: 8.5 g/dL — ABNORMAL LOW (ref 13.0–17.0)
LYMPHS ABS: 0.4 10*3/uL — AB (ref 0.7–4.0)
Lymphocytes Relative: 18 % (ref 12–46)
MCH: 35.3 pg — ABNORMAL HIGH (ref 26.0–34.0)
MCHC: 35.4 g/dL (ref 30.0–36.0)
MCV: 99.6 fL (ref 78.0–100.0)
MONOS PCT: 5 % (ref 3–12)
Monocytes Absolute: 0.1 10*3/uL (ref 0.1–1.0)
Neutro Abs: 1.5 10*3/uL — ABNORMAL LOW (ref 1.7–7.7)
Neutrophils Relative %: 76 % (ref 43–77)
PLATELETS: 22 10*3/uL — AB (ref 150–400)
RBC: 2.41 MIL/uL — ABNORMAL LOW (ref 4.22–5.81)
RDW: 16.2 % — AB (ref 11.5–15.5)
WBC Morphology: INCREASED
WBC: 2 10*3/uL — AB (ref 4.0–10.5)

## 2014-01-18 LAB — URINALYSIS, ROUTINE W REFLEX MICROSCOPIC
BILIRUBIN URINE: NEGATIVE
Glucose, UA: NEGATIVE mg/dL
Ketones, ur: NEGATIVE mg/dL
NITRITE: NEGATIVE
Protein, ur: 100 mg/dL — AB
SPECIFIC GRAVITY, URINE: 1.011 (ref 1.005–1.030)
UROBILINOGEN UA: 0.2 mg/dL (ref 0.0–1.0)
pH: 5.5 (ref 5.0–8.0)

## 2014-01-18 LAB — BASIC METABOLIC PANEL
BUN: 58 mg/dL — AB (ref 6–23)
BUN: 57 mg/dL — ABNORMAL HIGH (ref 6–23)
CO2: 9 mEq/L — CL (ref 19–32)
CO2: 11 mEq/L — ABNORMAL LOW (ref 19–32)
CREATININE: 4.36 mg/dL — AB (ref 0.50–1.35)
Calcium: 6.4 mg/dL — CL (ref 8.4–10.5)
Calcium: 6.9 mg/dL — ABNORMAL LOW (ref 8.4–10.5)
Chloride: 105 mEq/L (ref 96–112)
Chloride: 108 mEq/L (ref 96–112)
Creatinine, Ser: 3.75 mg/dL — ABNORMAL HIGH (ref 0.50–1.35)
GFR calc Af Amer: 19 mL/min — ABNORMAL LOW (ref >90)
GFR calc Af Amer: 23 mL/min — ABNORMAL LOW (ref >90)
GFR calc non Af Amer: 20 mL/min — ABNORMAL LOW (ref >90)
GFR, EST NON AFRICAN AMERICAN: 17 mL/min — AB (ref >90)
GLUCOSE: 106 mg/dL — AB (ref 70–99)
Glucose, Bld: 164 mg/dL — ABNORMAL HIGH (ref 70–99)
POTASSIUM: 4.5 meq/L (ref 3.7–5.3)
Potassium: 4 mEq/L (ref 3.7–5.3)
SODIUM: 136 meq/L — AB (ref 137–147)
Sodium: 133 mEq/L — ABNORMAL LOW (ref 137–147)

## 2014-01-18 LAB — I-STAT CHEM 8, ED
BUN: 60 mg/dL — AB (ref 6–23)
CHLORIDE: 109 meq/L (ref 96–112)
CREATININE: 4.7 mg/dL — AB (ref 0.50–1.35)
Calcium, Ion: 0.92 mmol/L — ABNORMAL LOW (ref 1.12–1.23)
Glucose, Bld: 86 mg/dL (ref 70–99)
HCT: 19 % — ABNORMAL LOW (ref 39.0–52.0)
Hemoglobin: 6.5 g/dL — CL (ref 13.0–17.0)
Potassium: 3.5 mEq/L — ABNORMAL LOW (ref 3.7–5.3)
SODIUM: 138 meq/L (ref 137–147)
TCO2: 9 mmol/L (ref 0–100)

## 2014-01-18 LAB — BLOOD GAS, VENOUS
Acid-base deficit: 13.7 mmol/L — ABNORMAL HIGH (ref 0.0–2.0)
Bicarbonate: 12.3 mEq/L — ABNORMAL LOW (ref 20.0–24.0)
DRAWN BY: 276051
Delivery systems: POSITIVE
FIO2: 0.3 %
O2 Saturation: 51.5 %
PCO2 VEN: 28.4 mmHg — AB (ref 45.0–50.0)
PEEP/CPAP: 5 cmH2O
PO2 VEN: 31.3 mmHg (ref 30.0–45.0)
Patient temperature: 97
Pressure control: 5 cmH2O
RATE: 14 resp/min
TCO2: 11.8 mmol/L (ref 0–100)
pH, Ven: 7.252 (ref 7.250–7.300)

## 2014-01-18 LAB — APTT: aPTT: 40 seconds — ABNORMAL HIGH (ref 24–37)

## 2014-01-18 LAB — I-STAT CG4 LACTIC ACID, ED
Lactic Acid, Venous: <0.3 mmol/L — ABNORMAL LOW (ref 0.5–2.2)
Lactic Acid, Venous: 0.4 mmol/L — ABNORMAL LOW (ref 0.5–2.2)

## 2014-01-18 LAB — CARBOXYHEMOGLOBIN
Carboxyhemoglobin: 1.6 % — ABNORMAL HIGH (ref 0.5–1.5)
METHEMOGLOBIN: 2.2 % — AB (ref 0.0–1.5)
O2 Saturation: 63.9 %
TOTAL HEMOGLOBIN: 7.4 g/dL — AB (ref 13.5–18.0)

## 2014-01-18 LAB — PROCALCITONIN: Procalcitonin: >175 ng/mL

## 2014-01-18 LAB — PHOSPHORUS: Phosphorus: 4 mg/dL (ref 2.3–4.6)

## 2014-01-18 LAB — CBC
HEMATOCRIT: 23.5 % — AB (ref 39.0–52.0)
Hemoglobin: 8.4 g/dL — ABNORMAL LOW (ref 13.0–17.0)
MCH: 35.3 pg — ABNORMAL HIGH (ref 26.0–34.0)
MCHC: 35.7 g/dL (ref 30.0–36.0)
MCV: 98.7 fL (ref 78.0–100.0)
Platelets: 27 10*3/uL — CL (ref 150–400)
RBC: 2.38 MIL/uL — ABNORMAL LOW (ref 4.22–5.81)
RDW: 16.3 % — ABNORMAL HIGH (ref 11.5–15.5)
WBC: 4.8 10*3/uL (ref 4.0–10.5)

## 2014-01-18 LAB — CORTISOL: Cortisol, Plasma: 47.2 ug/dL

## 2014-01-18 LAB — MRSA PCR SCREENING: MRSA by PCR: NEGATIVE

## 2014-01-18 LAB — FIBRINOGEN: FIBRINOGEN: 501 mg/dL — AB (ref 204–475)

## 2014-01-18 LAB — SODIUM, URINE, RANDOM: SODIUM UR: 51 meq/L

## 2014-01-18 LAB — CREATININE, URINE, RANDOM: Creatinine, Urine: 24.6 mg/dL

## 2014-01-18 LAB — TROPONIN I: Troponin I: <0.3 ng/mL (ref <0.30)

## 2014-01-18 LAB — LACTIC ACID, PLASMA: Lactic Acid, Venous: 0.6 mmol/L (ref 0.5–2.2)

## 2014-01-18 LAB — CLOSTRIDIUM DIFFICILE BY PCR: CDIFFPCR: NEGATIVE

## 2014-01-18 LAB — PREPARE RBC (CROSSMATCH)

## 2014-01-18 LAB — MAGNESIUM: Magnesium: 1.5 mg/dL (ref 1.5–2.5)

## 2014-01-18 MED ORDER — TBO-FILGRASTIM 300 MCG/0.5ML ~~LOC~~ SOSY
300.0000 ug | PREFILLED_SYRINGE | Freq: Once | SUBCUTANEOUS | Status: AC
  Administered 2014-01-18: 300 ug via SUBCUTANEOUS
  Filled 2014-01-18: qty 0.5

## 2014-01-18 MED ORDER — VANCOMYCIN HCL IN DEXTROSE 1-5 GM/200ML-% IV SOLN
1000.0000 mg | Freq: Once | INTRAVENOUS | Status: AC
  Administered 2014-01-18: 1000 mg via INTRAVENOUS
  Filled 2014-01-18: qty 200

## 2014-01-18 MED ORDER — EFAVIRENZ 600 MG PO TABS
600.0000 mg | ORAL_TABLET | Freq: Every day | ORAL | Status: DC
  Administered 2014-01-18: 600 mg via ORAL
  Filled 2014-01-18 (×2): qty 1

## 2014-01-18 MED ORDER — PIPERACILLIN-TAZOBACTAM 3.375 G IVPB 30 MIN
3.3750 g | Freq: Once | INTRAVENOUS | Status: AC
  Administered 2014-01-18: 3.375 g via INTRAVENOUS
  Filled 2014-01-18: qty 50

## 2014-01-18 MED ORDER — CHLORHEXIDINE GLUCONATE 0.12 % MT SOLN
15.0000 mL | Freq: Two times a day (BID) | OROMUCOSAL | Status: DC
  Administered 2014-01-18: 15 mL via OROMUCOSAL
  Filled 2014-01-18 (×2): qty 15

## 2014-01-18 MED ORDER — PIPERACILLIN-TAZOBACTAM IN DEX 2-0.25 GM/50ML IV SOLN
2.2500 g | Freq: Four times a day (QID) | INTRAVENOUS | Status: DC
  Administered 2014-01-18 – 2014-01-19 (×5): 2.25 g via INTRAVENOUS
  Filled 2014-01-18 (×7): qty 50

## 2014-01-18 MED ORDER — BIOTENE DRY MOUTH MT LIQD
15.0000 mL | Freq: Two times a day (BID) | OROMUCOSAL | Status: DC
  Administered 2014-01-18 – 2014-01-22 (×8): 15 mL via OROMUCOSAL

## 2014-01-18 MED ORDER — SODIUM BICARBONATE 8.4 % IV SOLN
INTRAVENOUS | Status: DC
  Administered 2014-01-18 – 2014-01-19 (×3): via INTRAVENOUS
  Filled 2014-01-18 (×8): qty 150

## 2014-01-18 MED ORDER — VANCOMYCIN HCL IN DEXTROSE 1-5 GM/200ML-% IV SOLN: 1000.0000 mg | INTRAVENOUS | Status: DC

## 2014-01-18 MED ORDER — HYDROCORTISONE NA SUCCINATE PF 100 MG IJ SOLR
100.0000 mg | Freq: Three times a day (TID) | INTRAMUSCULAR | Status: DC
  Administered 2014-01-18 – 2014-01-19 (×4): 100 mg via INTRAVENOUS
  Filled 2014-01-18 (×4): qty 2

## 2014-01-18 MED ORDER — SODIUM CHLORIDE 0.9 % IV BOLUS (SEPSIS)
1000.0000 mL | Freq: Once | INTRAVENOUS | Status: AC
  Administered 2014-01-18: 1000 mL via INTRAVENOUS

## 2014-01-18 MED ORDER — HYDROCORTISONE NA SUCCINATE PF 100 MG IJ SOLR
50.0000 mg | Freq: Once | INTRAMUSCULAR | Status: AC
  Administered 2014-01-18: 50 mg via INTRAVENOUS
  Filled 2014-01-18: qty 2

## 2014-01-18 MED ORDER — EMTRICITABINE 200 MG PO CAPS: 200.0000 mg | ORAL_CAPSULE | ORAL | Status: DC

## 2014-01-18 MED ORDER — CALCIUM GLUCONATE 10 % IV SOLN
1.0000 g | Freq: Once | INTRAVENOUS | Status: AC
  Administered 2014-01-18: 1 g via INTRAVENOUS
  Filled 2014-01-18: qty 10

## 2014-01-18 MED ORDER — DEXTROSE 5 % IV SOLN
25.0000 ug/min | INTRAVENOUS | Status: DC
  Administered 2014-01-18: 25 ug/min via INTRAVENOUS
  Administered 2014-01-18: 34.933 ug/min via INTRAVENOUS
  Administered 2014-01-18: 20 ug/min via INTRAVENOUS
  Administered 2014-01-18: 35 ug/min via INTRAVENOUS
  Filled 2014-01-18 (×4): qty 4

## 2014-01-18 MED ORDER — SODIUM CHLORIDE 0.9 % IV BOLUS (SEPSIS): 1000.0000 mL | INTRAVENOUS | Status: DC | PRN

## 2014-01-18 MED ORDER — PANTOPRAZOLE SODIUM 40 MG IV SOLR
40.0000 mg | Freq: Every day | INTRAVENOUS | Status: DC
  Administered 2014-01-18: 40 mg via INTRAVENOUS
  Filled 2014-01-18: qty 40

## 2014-01-18 MED ORDER — CALCIUM GLUCONATE 10 % IV SOLN
1.0000 g | Freq: Once | INTRAVENOUS | Status: AC
  Administered 2014-01-18: 1 g via INTRAVENOUS
  Filled 2014-01-18 (×2): qty 10

## 2014-01-18 MED ORDER — VANCOMYCIN HCL IN DEXTROSE 1-5 GM/200ML-% IV SOLN: 1000.0000 mg | Freq: Once | INTRAVENOUS | Status: DC

## 2014-01-18 MED ORDER — SODIUM CHLORIDE 0.9 % IV SOLN: 250.0000 mL | INTRAVENOUS | Status: DC | PRN

## 2014-01-18 MED ORDER — TENOFOVIR DISOPROXIL FUMARATE 300 MG PO TABS: 300.0000 mg | ORAL_TABLET | ORAL | Status: DC

## 2014-01-18 MED ORDER — EFAVIRENZ-EMTRICITAB-TENOFOVIR 600-200-300 MG PO TABS: 1.0000 | ORAL_TABLET | Freq: Every day | ORAL | Status: DC

## 2014-01-18 MED ORDER — PIPERACILLIN-TAZOBACTAM 3.375 G IVPB 30 MIN: 3.3750 g | Freq: Once | INTRAVENOUS | Status: DC

## 2014-01-18 MED ORDER — EFAVIRENZ 200 MG PO CAPS
600.0000 mg | ORAL_CAPSULE | Freq: Every day | ORAL | Status: DC
  Filled 2014-01-18: qty 3

## 2014-01-18 MED ORDER — SODIUM CHLORIDE 0.9 % IV SOLN
0.0300 [IU]/min | INTRAVENOUS | Status: DC
  Administered 2014-01-18: 0.03 [IU]/min via INTRAVENOUS
  Filled 2014-01-18: qty 2.5

## 2014-01-18 NOTE — ED Notes (Signed)
[phone] Mother[facility] phone # [place], contact if any changes!

## 2014-01-18 NOTE — Progress Notes (Signed)
eLink Physician-Brief Progress Note Patient Name: Thomas [name] DOB: 19-Oct-1980 MRN: [id number]  Date of Service  01/18/2014   HPI/Events of Note   Pt with increased WOB.  Significant metabolic acidosis.  eICU Interventions  Will add HCO3 infusion and try on BiPAP.   Intervention Category Major Interventions: Other:  [name] 01/18/2014, 7:03 AM

## 2014-01-18 NOTE — Progress Notes (Signed)
PULMONARY / CRITICAL CARE MEDICINE   Name: Thomas [name] MRN: [id number] DOB: 1980-08-07    ADMISSION DATE:  01/17/2014  PRIMARY SERVICE: PCCM  CHIEF COMPLAINT:  Diarrhea, dizziness.   BRIEF PATIENT DESCRIPTION:  33 years old male with PMH relevant for HIV -(CD4 360 on 11/20/13 )on antiretroviral therapy and Hodgkin's lymphoma IIIA diagnosed in January 2015 and on chemotherapy (ABVD) with last cycle given 01/16/14. Presents with severe diarrhea, dizziness and weakness. In the ED found to be hypotensive with no response in BP after 8 L IVF.   SIGNIFICANT EVENTS / STUDIES:  - Normal Chest X ray  LINES / TUBES: - Right port - [facility] 6/28 >>  CULTURES: Blood culture 6/27 > Urine culture 6/27 > Sputum culture ordered  ANTIBIOTICS: - Zosyn 01/17/14 > - Vancomycin 01/17/14 >  SUBJECTIVE:  Febrile Appears comfortable on BiPAP  VITAL SIGNS: Temp:  [96.8 F (36 C)-102.3 F (39.1 C)] 96.8 F (36 C) (06/28 0900) Pulse Rate:  [49-113] 72 (06/28 1045) Resp:  [16-25] 18 (06/28 1045) BP: (65-138)/(0-92) 94/60 mmHg (06/28 1045) SpO2:  [73 %-100 %] 100 % (06/28 1045) FiO2 (%):  [30 %] 30 % (06/28 0800) Weight:  [61.236 kg (135 lb)-67.6 kg (149 lb 0.5 oz)] 67.6 kg (149 lb 0.5 oz) (06/28 0700) HEMODYNAMICS:   VENTILATOR SETTINGS: Vent Mode:  [-] PCV;CPAP FiO2 (%):  [30 %] 30 % Set Rate:  [14 bmp] 14 bmp PEEP:  [5 cmH20] 5 cmH20 INTAKE / OUTPUT: Intake/Output     06/27 0701 - 06/28 0700 06/28 0701 - 06/29 0700   I.V. (mL/kg) 3500 (51.8)    Total Intake(mL/kg) 3500 (51.8)    Urine (mL/kg/hr) 1050 875 (3.1)   Total Output 1050 875   Net +2450 -875          PHYSICAL EXAMINATION: General: Pleasant male patient in no acute distress on BiPAP Eyes: Anicteric sclerae. ENT: Oropharynx clear. Dry mucous membranes. No thrush Lymph: No cervical, supraclavicular, or axillary lymphadenopathy. Heart: Normal S1, S2. No murmurs, rubs, or gallops appreciated. No bruits, equal pulses. Lungs:  Normal excursion, no dullness to percussion. Good air movement bilaterally, without wheezes or crackles. Normal upper airway sounds without evidence of stridor. Abdomen: Abdomen soft, non-tender and not distended, normoactive bowel sounds. No hepatosplenomegaly or masses. Musculoskeletal: No clubbing or synovitis, good peripheral pulses Skin: No rashes or lesions, no pallor, good cap Refill Neuro: No focal neurologic deficits.  LABS:  CBC  Recent Labs Lab 01/16/14 0838 01/17/14 2345 01/18/14 0303 01/18/14 0520  WBC 3.8* 2.0*  --  4.8  HGB 10.8* 8.5* 6.5* 8.4*  HCT 32.6* 24.0* 19.0* 23.5*  PLT 161 22*  --  27*   Coag's  Recent Labs Lab 01/18/14 0514  APTT 40*  INR 1.42   BMET  Recent Labs Lab 01/16/14 0839 01/17/14 2345 01/18/14 0303 01/18/14 0520  NA 139 129* 138 133*  K 3.4* 4.6 3.5* 4.0  CL  --  97 109 105  CO2 20* 12*  --  9*  BUN 15.8 61* 60* 58*  CREATININE 1.6* 5.12* 4.70* 4.36*  GLUCOSE 112 88 86 164*   Electrolytes  Recent Labs Lab 01/16/14 0839 01/17/14 2345 01/18/14 0520  CALCIUM 9.0 7.1* 6.4*  MG  --   --  1.5  PHOS  --   --  4.0   Sepsis Markers  Recent Labs Lab 01/18/14 0108 01/18/14 0303 01/18/14 1000  LATICACIDVEN <0.30* 0.40* 0.6   ABG  Recent Labs Lab 01/18/14 0600  PHART 7.259*  PCO2ART 20.4*  PO2ART 55.2*   Liver Enzymes  Recent Labs Lab 01/16/14 0839 01/17/14 2345  AST 15 121*  ALT 13 123*  ALKPHOS 147 119*  BILITOT 0.22 0.5  ALBUMIN 3.2* 2.8*   Cardiac Enzymes  Recent Labs Lab 01/18/14 0514  TROPONINI <0.30   Glucose No results found for this basename: GLUCAP,  in the last 168 hours  Imaging Dg Chest Port 1 View  01/18/2014   CLINICAL DATA:  Central line placement  EXAM: PORTABLE CHEST - 1 VIEW  COMPARISON:  01/08/2014  FINDINGS: Cardiomediastinal silhouette is stable. Persistent streaky bilateral interstitial prominence and patchy airspace disease consistent with asymmetric edema or pneumonia.  Right Port-A-Cath is unchanged in position. There is a left IJ central line with tip in SVC. No pneumothorax.  IMPRESSION: Persistent streaky bilateral interstitial prominence and patchy airspace disease consistent with asymmetric edema or pneumonia. Right Port-A-Cath is unchanged in position. There is a left IJ central line with tip in SVC. No pneumothorax.   Electronically Signed   By: Lahoma Crocker M.D.   On: 01/18/2014 10:01   Dg Chest Portable 1 View  01/18/2014   CLINICAL DATA:  Volume overload.  Shortness of breath, hypotension  EXAM: PORTABLE CHEST - 1 VIEW  COMPARISON:  Chest x-ray from the same day at 1:36 a.m.  FINDINGS: Right IJ porta catheter in stable position. Normal heart size. No change in upper mediastinal contours when accounting for portable technique.  There is new interstitial and airspace disease which is asymmetric to the peripheral right chest.  IMPRESSION: New lung opacities, right more than left. Rapid development favors asymmetric edema, acute lung injury, or aspiration pneumonitis.   Electronically Signed   By: Jorje Guild M.D.   On: 01/18/2014 06:36   Dg Chest Port 1 View  01/18/2014   CLINICAL DATA:  Hypotension  EXAM: PORTABLE CHEST - 1 VIEW  COMPARISON:  08/02/2013  FINDINGS: Right IJ porta catheter, tip at the upper cavoatrial junction.  Normal heart size and mediastinal contours. No consolidation, edema, effusion, or pneumothorax. Remote surgical site at the right neck base.  IMPRESSION: No active disease.   Electronically Signed   By: Jorje Guild M.D.   On: 01/18/2014 02:22     CXR: Central line in position No parenchymal lung infiltrates.   ASSESSMENT / PLAN:  PULMONARY A: 1) acute hypoxic respiratory failure P:  Initiate BiPAP  CARDIOVASCULAR A:  1) Septic shock, possible source is GI given severe diarrhea. Poor response to IVF's. Normal lactate.  Repeat lactate remains low which is reassuring- P:  - Will continue IVF resuscitation per sepsis  protocol - Levophed to goal MAP of 65, add vasopressin - Stress dose steroids  RENAL A:   1) Acute renal failure, likely pre renal 2) Hypocalcemia  3) Hypokalemia 4) normal anion gap metabolic acidosis - related to diarrhea P:   -  check feNa - Will replenish lytes as indicated.  -Add bicarbonate drip  GASTROINTESTINAL A:   1) Severe diarrhea P:   - C. Diff - Stool GI pathogen panel   HEMATOLOGIC A:   1) Pancytopenia likely secondary to chemotherapy / sepsis P:  - Will transfuse for Hgb < 7 - Gets Neulasta after chemotherapy, we'll dose Neupogen as inpatient, expect counts to drop - No evidence of bleeding, will follow platelet count and transfuse for plt < 20,000  INFECTIOUS A:   1) Septic shock. Likely source is the GI tract  P:   -  Zosyn - Vancomycin - Will follow cultures   ENDOCRINE A:   1) No issues P:   - Stress dose steroids  NEUROLOGIC A:   1) No issues P:    Summary - respiratory distress likely related to severe acidosis and shock, he seems to have settled with BiPAP. Renal function is improving with hydration  I have personally obtained a history, examined the patient, evaluated laboratory and imaging results, formulated the assessment and plan and placed orders. CRITICAL CARE: The patient is critically ill with multiple organ systems failure and requires high complexity decision making for assessment and support, frequent evaluation and titration of therapies, application of advanced monitoring technologies and extensive interpretation of multiple databases. Critical Care Time devoted to patient care services described in this note is 35 minutes.    Kara Mead MD. Shade Flood. [facility] Pager [phone] If no response call [phone]   01/18/2014, 11:10 AM

## 2014-01-18 NOTE — ED Notes (Signed)
ICU unable to take pt at this time per [facility], CN.  Geryl Rankins and Clarise Cruz, [facility] all made aware.  Will continue to monitor pt.

## 2014-01-18 NOTE — Procedures (Signed)
Central Venous Catheter Insertion Procedure Note Thomas [name] [id number] August 22, 1980  Procedure: Insertion of Central Venous Catheter Indications: Assessment of intravascular volume, Drug and/or fluid administration and Frequent blood sampling  Procedure Details Consent: Risks of procedure as well as the alternatives and risks of each were explained to the (patient/caregiver).  Consent for procedure obtained. Time Out: Verified patient identification, verified procedure, site/side was marked, verified correct patient position, special equipment/implants available, medications/allergies/relevent history reviewed, required imaging and test results available.  Performed  Maximum sterile technique was used including antiseptics, cap, gloves, gown, hand hygiene, mask and sheet. Skin prep: Chlorhexidine; local anesthetic administered A antimicrobial bonded/coated triple lumen catheter was placed in the left internal jugular vein using the Seldinger technique.  Evaluation Blood flow good Complications: No apparent complications Patient did tolerate procedure well. Chest X-ray ordered to verify placement.  CXR: pending.  Ultrasound used to guide needle -pic in chart  ALVA,RAKESH V. 01/18/2014, 9:44 AM

## 2014-01-18 NOTE — ED Notes (Signed)
Per RN, labs are going to be pulled from pt's port

## 2014-01-18 NOTE — ED Notes (Signed)
Critical labs C02 9 and Calcium of 6.4 reported to Dr. Marlowe Sax and Vickii Penna

## 2014-01-18 NOTE — ED Provider Notes (Addendum)
Medical screening examination/treatment/procedure(s) were conducted as a shared visit with non-physician practitioner(s) or resident  and myself.  I personally evaluated the patient during the encounter and agree with the findings and plan unless otherwise indicated.    I have personally reviewed any xrays and/ or EKG's with the provider and I agree with interpretation.     patient with history of HIV on medication and CD4 count greater than 200 1 month ago, Hodgkin's lymphoma with last chemotherapy on Friday presents with general weakness, fevers and diarrhea for the past 24 hours Patient has recurrent diarrhea and intermittent vomiting nonbloody and worsening general fatigue. No active bleeding. Patient is on Neupogen.  On exam patient has mild general weakness/fatigue appearance, dry mucous membranes, sinus tachycardia, soft abdomen no tenderness, lungs clear to auscultation.  Patient's blood pressure trended down to 60 systolic. Difficult IV access ultrasound-guided IV placed and bedside ultrasound performed with no pericardial effusion. Discussed goal of 4-5 L IV fluid boluses and likely stepped-down admission. Broad antibiotics given with sepsis presentation and pancytopenia. 16-gauge peripheral long IV placed by myself ultrasound-guided. Multiple rechecks in ER and patient currently on 5 and 6 L. Discuss case with triad recommended ICU and I discussed case with intensivist who agreed with their admission. Septic shock. With persistent hypotension steroids and calcium ordered.  CRITICAL CARE Performed by: Mariea Clonts   Total critical care time: 120 min  Critical care time was exclusive of separately billable procedures and treating other patients.  Critical care was necessary to treat or prevent imminent or life-threatening deterioration.  Critical care was time spent personally by me on the following activities: development of treatment plan with patient and/or surrogate as well as  nursing, discussions with consultants, evaluation of patient's response to treatment, examination of patient, obtaining history from patient or surrogate, ordering and performing treatments and interventions, ordering and review of laboratory studies, ordering and review of radiographic studies, pulse oximetry and re-evaluation of patient's condition. Emergency Ultrasound Study:   Angiocath insertion Performed by: Mariea Clonts  Consent: Verbal consent obtained. Risks and benefits: risks, benefits and alternatives were discussed Immediately prior to procedure the correct patient, procedure, equipment, support staff and site/side marked as needed.  Indication: difficult IV access Preparation: Patient was prepped and draped in the usual sterile fashion. Vein Location: right AC vein was visualized during assessment for potential access sites and was found to be patent/ easily compressed with linear ultrasound.  The needle was visualized with real-time ultrasound and guided into the vein. Gauge: 16 g   Image saved and stored.  Normal blood return.  Patient tolerance: Patient tolerated the procedure well with no immediate complications.    EMERGENCY DEPARTMENT [place] CARDIAC EXAM "Study: Limited Ultrasound of the heart and pericardium"  INDICATIONS:Hypotension and Tachycardia Multiple views of the heart and pericardium were obtained in real-time with a multi-frequency probe.  PERFORMED HM:CNOBSJ  IMAGES ARCHIVED?: Yes  FINDINGS: No pericardial effusion, Hyperdynamic contractility and Tamponade physiology absent  LIMITATIONS: none  VIEWS USED: Parasternal long axis, Parasternal short axis and Inferior Vena Cava  INTERPRETATION: Cardiac activity present, Cardiac tamponade absent, Probable low CVP and Increased contractility  The patients results and plan were reviewed and discussed.   Any x-rays performed were personally reviewed by myself.   Differential diagnosis were considered with  the presenting HPI.  Medications  0.9 %  sodium chloride infusion (0 mLs Intravenous Stopped 01/18/14 0124)    Followed by  0.9 %  sodium chloride infusion (1,000 mLs  Intravenous New Bag/Given 01/18/14 0142)  norepinephrine (LEVOPHED) 4 mg in dextrose 5 % 250 mL infusion (34.933 mcg/min Intravenous New Bag/Given 01/18/14 0600)  sodium chloride 0.9 % bolus 1,000 mL (not administered)  sodium chloride 0.9 % bolus 1,000 mL (not administered)  0.9 %  sodium chloride infusion (not administered)  pantoprazole (PROTONIX) injection 40 mg (not administered)  vancomycin (VANCOCIN) IVPB 1000 mg/200 mL premix (not administered)  piperacillin-tazobactam (ZOSYN) IVPB 2.25 g (2.25 g Intravenous New Bag/Given 01/18/14 0608)  hydrocortisone sodium succinate (SOLU-CORTEF) 100 MG injection 100 mg (100 mg Intravenous Given 01/18/14 0729)  calcium gluconate 1 g in sodium chloride 0.9 % 100 mL IVPB (not administered)  sodium bicarbonate 150 mEq in dextrose 5 % 1,000 mL infusion (not administered)  ondansetron (ZOFRAN) injection 4 mg (4 mg Intravenous Given 01/17/14 2308)  lidocaine-prilocaine (EMLA) cream ( Topical Given 01/17/14 2354)  ibuprofen (ADVIL,MOTRIN) tablet 800 mg (800 mg Oral Given 01/18/14 0105)  vancomycin (VANCOCIN) IVPB 1000 mg/200 mL premix (0 mg Intravenous Stopped 01/18/14 0248)  piperacillin-tazobactam (ZOSYN) IVPB 3.375 g (0 g Intravenous Stopped 01/18/14 0142)  sodium chloride 0.9 % bolus 1,000 mL (0 mLs Intravenous Stopped 01/18/14 0255)  sodium chloride 0.9 % bolus 1,000 mL (0 mLs Intravenous Stopped 01/18/14 0254)  calcium gluconate 1 g in sodium chloride 0.9 % 100 mL IVPB (1 g Intravenous New Bag/Given 01/18/14 0339)  hydrocortisone sodium succinate (SOLU-CORTEF) 100 MG injection 50 mg (50 mg Intravenous Given 01/18/14 0339)     Filed Vitals:   01/18/14 0100 01/18/14 0130 01/18/14 0200 01/18/14 0218  BP: 82/41 92/35 70/23  75/33  Pulse: 108  113 113  Temp:    101.6 F (38.7 C)  TempSrc:     Rectal  Resp: 19 22 22 20   Height: 6\' 1"  (1.854 m)     Weight: 135 lb (61.236 kg)     SpO2: 100%  100% 100%    Admission/ observation were discussed with the admitting physician, patient and/or family and they are comfortable with the plan.   Septic shock, pancytopenia, ARF, Dehydration, Diarrhea, HIV, HL  ICU assessed in the ER in place further orders to assist and patient care. Patient in the ER for prolonged period he paged ICU in nursing also discussed with nursing house supervisor to assist in bed placement. Blood pressure did improve on levo fed however trended back down. Repeat calcium IV ordered and I should updated. ABG and repeat labs. Patient has mild shortness of breath and clinical concern for possible mild fluid overload as patient received 7 L to resuscitation. Patient has produced urine output.   Mariea Clonts, MD 01/18/14 2706  Mariea Clonts, MD 01/18/14 [phone]

## 2014-01-18 NOTE — ED Notes (Signed)
Dr. Reather Converse at bedside and aware of BP-pending further orders for pressors

## 2014-01-18 NOTE — H&P (Signed)
PULMONARY / CRITICAL CARE MEDICINE   Name: Thomas [name] MRN: [id number] DOB: [birth year]    ADMISSION DATE:  01/17/2014  PRIMARY SERVICE: PCCM  CHIEF COMPLAINT:  Diarrhea, dizziness.   BRIEF PATIENT DESCRIPTION:  33 years old male with PMH relevant for HIV on antiretroviral therapy and Hodgkin's lymphoma IIIA diagnosed in January 2015 and on chemotherapy (ABVD) with last cycle given 01/16/14. Presents with severe diarrhea, dizziness and weakness. In the ED found to be hypotensive with no response in BP after 8 L IVF.   SIGNIFICANT EVENTS / STUDIES:  - Normal Chest X ray  LINES / TUBES: - Right port - Peripheral IV  CULTURES: Blood culture 6/27 > Urine culture 6/27 > Sputum culture ordered  ANTIBIOTICS: - Zosyn 01/17/14 > - Vancomycin 01/17/14 >  HISTORY OF PRESENT ILLNESS:   33 years old male with PMH relevant for HIV on antiretroviral therapy (ATRIPLA) and Hodgkin's lymphoma IIIA diagnosed in January 2015 and on chemotherapy (ABVD) with last cycle given 01/16/14. Presents with severe diarrhea, dizziness and weakness. He also had chills and questionable fever. These symptoms started on Friday 6/26 after his chemotherapy. Today he presents to the ED at [facility] with worsening symptoms. Found to have a fever of 101 and hypotensive with poor response to 8 L IVF's. Apart from the diarrhea there are no other obvious sources of infection. His lactate is normal but his creatinine increased from 1.6 on 6/26 to 5.12. His WBC is 2.0. At the time of my examination the patient is awake, alert with MAP of 65 on Levophed.   PAST MEDICAL HISTORY :  Past Medical History  Diagnosis Date  . HIV disease 06/25/2012  . Skin lesion 07/03/2012  . Leg pain 07/03/2012  . Sinus tachycardia 08/06/2012  . Penile cyst 08/06/2012  . Cancer    Past Surgical History  Procedure Laterality Date  . 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  this after  several previous I and Ds of rectal abcess.   [name] node biopsy Right 08/08/2013    Procedure: SUPRACLAVICULAR LYMPH NODE BIOPSY;  Surgeon: Gaye Pollack, MD;  Location: [place] OR;  Service: Thoracic;  Laterality: Right;  . 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 Authorizing Provider  atenolol (TENORMIN) 25 MG tablet Take 25 mg by mouth every morning.   Yes Historical Provider, MD  efavirenz-emtricitabine-tenofovir (ATRIPLA) 600-200-300 MG per tablet Take 1 tablet by mouth at bedtime.   Yes Historical Provider, MD  ferrous sulfate 325 (65 FE) MG tablet Take 1 tablet (325 mg total) by mouth 2 (two) times daily with a meal. 10/21/13  Yes [name], MD  lidocaine-prilocaine (EMLA) cream APPLY TOPICALLY TO PORT AS DIRECTED. 10/21/13  Yes Wyatt Portela, MD  prochlorperazine (COMPAZINE) 10 MG tablet Take 1 tablet (10 mg total) by mouth every 6 (six) hours as needed for nausea or vomiting. 11/20/13  Yes Wyatt Portela, MD   Allergies  Allergen Reactions  . Tylenol [Acetaminophen]     sweats    FAMILY HISTORY:  History reviewed. No pertinent family history. SOCIAL HISTORY:  reports that he has quit smoking. His smoking use included Cigarettes. He started smoking about 4 months ago. He has a 15 pack-year smoking history. He has never used smokeless tobacco. He reports that he does not drink alcohol or use illicit drugs.  REVIEW OF  SYSTEMS:  All systems reviewed and found negative except for what I mentioned in the HPI.   SUBJECTIVE:   VITAL SIGNS: Temp:  [98.6 F (37 C)-102.3 F (39.1 C)] 98.6 F (37 C) (06/28 0350) Pulse Rate:  [95-113] 95 (06/28 0430) Resp:  [16-25] 21 (06/28 0430) BP: (65-108)/(0-64) 99/55 mmHg (06/28 0430) SpO2:  [73 %-100 %] 97 % (06/28 0430) Weight:  [135 lb (61.236 kg)] 135 lb (61.236 kg) (06/28 0100) HEMODYNAMICS:   VENTILATOR SETTINGS:   INTAKE / OUTPUT: Intake/Output     06/27  0701 - 06/28 0700   I.V. (mL/kg) 3500 (57.2)   Total Intake(mL/kg) 3500 (57.2)   Urine (mL/kg/hr) 250   Total Output 250   Net +3250         PHYSICAL EXAMINATION: General: Pleasant male patient in no acute distress. Eyes: Anicteric sclerae. ENT: Oropharynx clear. Dry mucous membranes. No thrush Lymph: No cervical, supraclavicular, or axillary lymphadenopathy. Heart: Normal S1, S2. No murmurs, rubs, or gallops appreciated. No bruits, equal pulses. Lungs: Normal excursion, no dullness to percussion. Good air movement bilaterally, without wheezes or crackles. Normal upper airway sounds without evidence of stridor. Abdomen: Abdomen soft, non-tender and not distended, normoactive bowel sounds. No hepatosplenomegaly or masses. Musculoskeletal: No clubbing or synovitis. Skin: No rashes or lesions Neuro: No focal neurologic deficits.  LABS:  CBC  Recent Labs Lab 01/16/14 0838 01/17/14 2345 01/18/14 0303  WBC 3.8* 2.0*  --   HGB 10.8* 8.5* 6.5*  HCT 32.6* 24.0* 19.0*  PLT 161 22*  --    Coag's No results found for this basename: APTT, INR,  in the last 168 hours BMET  Recent Labs Lab 01/16/14 0839 01/17/14 2345 01/18/14 0303  NA 139 129* 138  K 3.4* 4.6 3.5*  CL  --  97 109  CO2 20* 12*  --   BUN 15.8 61* 60*  CREATININE 1.6* 5.12* 4.70*  GLUCOSE 112 88 86   Electrolytes  Recent Labs Lab 01/16/14 0839 01/17/14 2345  CALCIUM 9.0 7.1*   Sepsis Markers  Recent Labs Lab 01/18/14 0108 01/18/14 0303  LATICACIDVEN <0.30* 0.40*   ABG No results found for this basename: PHART, PCO2ART, PO2ART,  in the last 168 hours Liver Enzymes  Recent Labs Lab 01/16/14 0839 01/17/14 2345  AST 15 121*  ALT 13 123*  ALKPHOS 147 119*  BILITOT 0.22 0.5  ALBUMIN 3.2* 2.8*   Cardiac Enzymes No results found for this basename: TROPONINI, PROBNP,  in the last 168 hours Glucose No results found for this basename: GLUCAP,  in the last 168 hours  Imaging Dg Chest Port 1  View  01/18/2014   CLINICAL DATA:  Hypotension  EXAM: PORTABLE CHEST - 1 VIEW  COMPARISON:  08/02/2013  FINDINGS: Right IJ porta catheter, tip at the upper cavoatrial junction.  Normal heart size and mediastinal contours. No consolidation, edema, effusion, or pneumothorax. Remote surgical site at the right neck base.  IMPRESSION: No active disease.   Electronically Signed   By: Jorje Guild M.D.   On: 01/18/2014 02:22     CXR:  No parenchymal lung infiltrates.   ASSESSMENT / PLAN:  PULMONARY A: 1) No issues P:    CARDIOVASCULAR A:  1) Septic shock, possible source is GI given severe diarrhea. Poor response to IVF's. Normal lactate.  P:  - Will continue IVF resuscitation per sepsis protocol - Levophed to goal MAP of 65 - Will follow lactate - Stress dose steroids  RENAL A:   1)  Acute renal failure, likely pre renal 2) Hypocalcemia  3) Hypokalemia P:   - Will follow BMP - Will replenish lytes as indicated.   GASTROINTESTINAL A:   1) Severe diarrhea P:   - C. Diff - Stool GI pathogen panel   HEMATOLOGIC A:   1) Pancytopenia likely secondary to chemotherapy / sepsis P:  - Will transfuse 2 units of PRBC's for Hgb < 7 - Gets Neulasta after chemotherapy - No evidence of bleeding, will follow platelet count and transfuse for plt < 20,000  INFECTIOUS A:   1) Septic shock. Likely source is the GI tract  P:   - Zosyn - Vancomycin - Will follow cultures - ID consult in am  ENDOCRINE A:   1) No issues P:   - Stress dose steroids  NEUROLOGIC A:   1) No issues P:     I have personally obtained a history, examined the patient, evaluated laboratory and imaging results, formulated the assessment and plan and placed orders. CRITICAL CARE: The patient is critically ill with multiple organ systems failure and requires high complexity decision making for assessment and support, frequent evaluation and titration of therapies, application of advanced monitoring  technologies and extensive interpretation of multiple databases. Critical Care Time devoted to patient care services described in this note is 60 minutes.   Waynetta Pean, MD Pulmonary and [place] Pager: [phone]  01/18/2014, 5:01 AM

## 2014-01-18 NOTE — Progress Notes (Signed)
Attempted ABG x 2, however sample was venous. Called results to Dr Earnest Conroy. Received instructions for Pt to remain on BiPap. Entered results as Venous Blood Gas per MD order.

## 2014-01-18 NOTE — ED Notes (Signed)
Pt remains alert and oriented with hypotension-MP ST with rate 108/normal saline continues to be pressure bagged in-Vancomycin infused

## 2014-01-18 NOTE — Progress Notes (Signed)
ANTIBIOTIC CONSULT NOTE - INITIAL  Pharmacy Consult for Vancomycin and Zosyn  Indication: rule out sepsis  Allergies  Allergen Reactions  . Tylenol [Acetaminophen]     sweats    Patient Measurements: Height: 6\' 1"  (185.4 cm) Weight: 135 lb (61.236 kg) IBW/kg (Calculated) : 79.9 Adjusted Body Weight:   Vital Signs: Temp: 98.6 F (37 C) (06/28 0350) Temp src: Oral (06/28 0350) BP: 103/64 mmHg (06/28 0345) Pulse Rate: 101 (06/28 0345) Intake/Output from previous day: 06/27 0701 - 06/28 0700 In: 3500 [I.V.:3500] Out: 250 [Urine:250] Intake/Output from this shift: Total I/O In: 3500 [I.V.:3500] Out: 250 [Urine:250]  Labs:  Recent Labs  01/16/14 0838 01/16/14 0839 01/17/14 2345 01/18/14 0303  WBC 3.8*  --  2.0*  --   HGB 10.8*  --  8.5* 6.5*  PLT 161  --  22*  --   CREATININE  --  1.6* 5.12* 4.70*   Estimated Creatinine Clearance: 19.9 ml/min (by C-G formula based on Cr of 4.7). No results found for this basename: VANCOTROUGH, VANCOPEAK, VANCORANDOM, GENTTROUGH, GENTPEAK, GENTRANDOM, TOBRATROUGH, TOBRAPEAK, TOBRARND, AMIKACINPEAK, AMIKACINTROU, AMIKACIN,  in the last 72 hours   Microbiology: No results found for this or any previous visit (from the past 720 hour(s)).  Medical History: Past Medical History  Diagnosis Date  . HIV disease 06/25/2012  . Skin lesion 07/03/2012  . Leg pain 07/03/2012  . Sinus tachycardia 08/06/2012  . Penile cyst 08/06/2012  . Cancer     Medications:  Anti-infectives   Start     Dose/Rate Route Frequency Ordered Stop   01/19/14 2200  vancomycin (VANCOCIN) IVPB 1000 mg/200 mL premix     1,000 mg 200 mL/hr over 60 Minutes Intravenous Every 48 hours 01/18/14 0357     01/18/14 0600  piperacillin-tazobactam (ZOSYN) IVPB 2.25 g     2.25 g 100 mL/hr over 30 Minutes Intravenous 4 times per day 01/18/14 0357     01/18/14 0400  piperacillin-tazobactam (ZOSYN) IVPB 3.375 g  Status:  Discontinued     3.375 g 100 mL/hr over 30 Minutes  Intravenous  Once 01/18/14 0346 01/18/14 0351   01/18/14 0400  vancomycin (VANCOCIN) IVPB 1000 mg/200 mL premix  Status:  Discontinued     1,000 mg 200 mL/hr over 60 Minutes Intravenous  Once 01/18/14 0346 01/18/14 0351   01/18/14 0100  vancomycin (VANCOCIN) IVPB 1000 mg/200 mL premix     1,000 mg 200 mL/hr over 60 Minutes Intravenous  Once 01/18/14 0053 01/18/14 0248   01/18/14 0100  piperacillin-tazobactam (ZOSYN) IVPB 3.375 g     3.375 g 100 mL/hr over 30 Minutes Intravenous  Once 01/18/14 0053 01/18/14 0142     Assessment: Patient with sepsis, HIV and hodgkins lymphona undergoing chemotherapy (ABVD).  First dose of antibiotics already given.  Patient with very reduced renal function <19mL/min.    Goal of Therapy:  Vancomycin trough level 15-20 mcg/ml Zosyn based on renal function   Plan:  Measure antibiotic drug levels at steady state Follow up culture results Vancomycin 1gm iv q48hr Zosyn 2.25gm iv q6hr  Tyler Deis, [facility] [place] [place] 01/18/2014,3:58 AM

## 2014-01-18 NOTE — ED Notes (Signed)
Pt is awake and alert-rigorous chills-see rectal temp/Dr. Reather Converse aware-2 more liters normal saline to be pressure bagged in

## 2014-01-18 NOTE — ED Notes (Signed)
Platelets 22, Rn and Dr. Venora Maples notified

## 2014-01-19 ENCOUNTER — Other Ambulatory Visit: Payer: Self-pay | Admitting: *Deleted

## 2014-01-19 ENCOUNTER — Inpatient Hospital Stay (HOSPITAL_COMMUNITY): Payer: Medicare Other

## 2014-01-19 DIAGNOSIS — R6520 Severe sepsis without septic shock: Secondary | ICD-10-CM

## 2014-01-19 DIAGNOSIS — E860 Dehydration: Secondary | ICD-10-CM

## 2014-01-19 DIAGNOSIS — J189 Pneumonia, unspecified organism: Secondary | ICD-10-CM

## 2014-01-19 DIAGNOSIS — R112 Nausea with vomiting, unspecified: Secondary | ICD-10-CM

## 2014-01-19 DIAGNOSIS — D509 Iron deficiency anemia, unspecified: Secondary | ICD-10-CM

## 2014-01-19 DIAGNOSIS — I959 Hypotension, unspecified: Secondary | ICD-10-CM

## 2014-01-19 DIAGNOSIS — N179 Acute kidney failure, unspecified: Secondary | ICD-10-CM

## 2014-01-19 DIAGNOSIS — Z21 Asymptomatic human immunodeficiency virus [HIV] infection status: Secondary | ICD-10-CM

## 2014-01-19 DIAGNOSIS — R197 Diarrhea, unspecified: Secondary | ICD-10-CM

## 2014-01-19 DIAGNOSIS — C8190 Hodgkin lymphoma, unspecified, unspecified site: Secondary | ICD-10-CM

## 2014-01-19 DIAGNOSIS — A419 Sepsis, unspecified organism: Secondary | ICD-10-CM

## 2014-01-19 DIAGNOSIS — E43 Unspecified severe protein-calorie malnutrition: Secondary | ICD-10-CM

## 2014-01-19 LAB — COMPREHENSIVE METABOLIC PANEL
ALT: 63 U/L — ABNORMAL HIGH (ref 0–53)
AST: 33 U/L (ref 0–37)
Albumin: 2.1 g/dL — ABNORMAL LOW (ref 3.5–5.2)
Alkaline Phosphatase: 76 U/L (ref 39–117)
BILIRUBIN TOTAL: 0.4 mg/dL (ref 0.3–1.2)
BUN: 50 mg/dL — ABNORMAL HIGH (ref 6–23)
CO2: 15 meq/L — AB (ref 19–32)
CREATININE: 3.37 mg/dL — AB (ref 0.50–1.35)
Calcium: 6.8 mg/dL — ABNORMAL LOW (ref 8.4–10.5)
Chloride: 108 mEq/L (ref 96–112)
GFR calc Af Amer: 27 mL/min — ABNORMAL LOW (ref >90)
GFR, EST NON AFRICAN AMERICAN: 23 mL/min — AB (ref >90)
Glucose, Bld: 103 mg/dL — ABNORMAL HIGH (ref 70–99)
Potassium: 3 mEq/L — ABNORMAL LOW (ref 3.7–5.3)
Sodium: 141 mEq/L (ref 137–147)
Total Protein: 5.3 g/dL — ABNORMAL LOW (ref 6.0–8.3)

## 2014-01-19 LAB — CBC
HCT: 19.5 % — ABNORMAL LOW (ref 39.0–52.0)
Hemoglobin: 7.2 g/dL — ABNORMAL LOW (ref 13.0–17.0)
MCH: 34.6 pg — AB (ref 26.0–34.0)
MCHC: 36.9 g/dL — AB (ref 30.0–36.0)
MCV: 93.8 fL (ref 78.0–100.0)
PLATELETS: 13 10*3/uL — AB (ref 150–400)
RBC: 2.08 MIL/uL — ABNORMAL LOW (ref 4.22–5.81)
RDW: 18.8 % — AB (ref 11.5–15.5)
WBC: 6.1 10*3/uL (ref 4.0–10.5)

## 2014-01-19 LAB — URINE CULTURE
COLONY COUNT: NO GROWTH
Culture: NO GROWTH

## 2014-01-19 LAB — PREPARE RBC (CROSSMATCH)

## 2014-01-19 LAB — MAGNESIUM: Magnesium: 1.6 mg/dL (ref 1.5–2.5)

## 2014-01-19 LAB — PROCALCITONIN: Procalcitonin: 131.45 ng/mL

## 2014-01-19 MED ORDER — POTASSIUM CHLORIDE 20 MEQ/15ML (10%) PO LIQD
20.0000 meq | Freq: Once | ORAL | Status: AC
  Administered 2014-01-19: 20 meq via ORAL
  Filled 2014-01-19: qty 15

## 2014-01-19 MED ORDER — PIPERACILLIN-TAZOBACTAM 3.375 G IVPB: 3.3750 g | Freq: Three times a day (TID) | INTRAVENOUS | Status: DC

## 2014-01-19 MED ORDER — METRONIDAZOLE 500 MG PO TABS
500.0000 mg | ORAL_TABLET | Freq: Three times a day (TID) | ORAL | Status: DC
  Administered 2014-01-19 – 2014-01-22 (×10): 500 mg via ORAL
  Filled 2014-01-19 (×13): qty 1

## 2014-01-19 MED ORDER — POTASSIUM CHLORIDE 10 MEQ/50ML IV SOLN
10.0000 meq | INTRAVENOUS | Status: DC
  Administered 2014-01-19 (×4): 10 meq via INTRAVENOUS
  Filled 2014-01-19 (×3): qty 50

## 2014-01-19 MED ORDER — TENOFOVIR DISOPROXIL FUMARATE 300 MG PO TABS
300.0000 mg | ORAL_TABLET | ORAL | Status: DC
  Filled 2014-01-19: qty 1

## 2014-01-19 MED ORDER — VANCOMYCIN HCL IN DEXTROSE 750-5 MG/150ML-% IV SOLN
750.0000 mg | INTRAVENOUS | Status: DC
  Filled 2014-01-19 (×2): qty 150

## 2014-01-19 MED ORDER — ETRAVIRINE 100 MG PO TABS
200.0000 mg | ORAL_TABLET | Freq: Two times a day (BID) | ORAL | Status: DC
  Administered 2014-01-19 – 2014-01-22 (×6): 200 mg via ORAL
  Filled 2014-01-19 (×8): qty 2

## 2014-01-19 MED ORDER — RALTEGRAVIR POTASSIUM 400 MG PO TABS
400.0000 mg | ORAL_TABLET | Freq: Two times a day (BID) | ORAL | Status: DC
  Administered 2014-01-19 – 2014-01-22 (×6): 400 mg via ORAL
  Filled 2014-01-19 (×7): qty 1

## 2014-01-19 MED ORDER — CIPROFLOXACIN HCL 500 MG PO TABS
500.0000 mg | ORAL_TABLET | Freq: Two times a day (BID) | ORAL | Status: DC
  Administered 2014-01-19 – 2014-01-22 (×7): 500 mg via ORAL
  Filled 2014-01-19 (×9): qty 1

## 2014-01-19 MED ORDER — SODIUM CHLORIDE 0.9 % IJ SOLN
10.0000 mL | INTRAMUSCULAR | Status: DC | PRN
  Administered 2014-01-21: 20 mL
  Administered 2014-01-22: 10 mL

## 2014-01-19 MED ORDER — EMTRICITABINE 200 MG PO CAPS
200.0000 mg | ORAL_CAPSULE | ORAL | Status: DC
  Administered 2014-01-19 – 2014-01-21 (×2): 200 mg via ORAL
  Filled 2014-01-19 (×2): qty 1

## 2014-01-19 MED ORDER — SODIUM CHLORIDE 0.9 % IV SOLN: 250.0000 mL | INTRAVENOUS | Status: DC | PRN

## 2014-01-19 MED ORDER — HYDROCORTISONE NA SUCCINATE PF 100 MG IJ SOLR
50.0000 mg | Freq: Two times a day (BID) | INTRAMUSCULAR | Status: DC
  Administered 2014-01-19 – 2014-01-21 (×4): 50 mg via INTRAVENOUS
  Filled 2014-01-19: qty 2
  Filled 2014-01-19: qty 1
  Filled 2014-01-19: qty 2
  Filled 2014-01-19 (×3): qty 1
  Filled 2014-01-19: qty 2

## 2014-01-19 MED ORDER — ENSURE PO LIQD: 237.0000 mL | Freq: Three times a day (TID) | ORAL | Status: DC

## 2014-01-19 MED ORDER — LOPERAMIDE HCL 2 MG PO CAPS
2.0000 mg | ORAL_CAPSULE | ORAL | Status: DC | PRN
  Administered 2014-01-19 – 2014-01-21 (×2): 2 mg via ORAL
  Filled 2014-01-19 (×2): qty 1

## 2014-01-19 MED ORDER — BOOST / RESOURCE BREEZE PO LIQD
1.0000 | Freq: Three times a day (TID) | ORAL | Status: DC
  Administered 2014-01-19 – 2014-01-22 (×6): 1 via ORAL

## 2014-01-19 NOTE — Progress Notes (Signed)
Critical result of Platelete 13, called to [facility] ( Dr Melvyn Novas), new order to transfuse platelet received, no active bleed noted nor reported by patient. Pt will continue to be monitored.

## 2014-01-19 NOTE — Consult Note (Signed)
[facility]    Date of Admission:  01/17/2014  Date of Consult:  01/19/2014  Reason for Consult: HIV regimen in context of acute renal failure Referring Physician: Dr.Siqueiros   HPI: Thomas [name] is an 33 y.o. 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 By Saturday  Was having severe uncontrollable watery diarrhea, dizziness, weakness found to be severely hypotensvie and with AKI. He has been given IVF, admitted to ICU, treated with vancomycin and zosyn and then ciprofloxacin. His CDI PCR was negative. GI stool pathogen has been sent. He has had improvement in his renal fxn with CR now down to 3.37. His Atripla has been appropriately split into sustiva qdaily and qod TNF and EMTricitabine.   We were consulted regarding optimal ARV reigmen in setting of his AKI   Past Medical History  Diagnosis Date  . HIV disease 06/25/2012  . Skin lesion 07/03/2012  . Leg pain 07/03/2012  . Sinus tachycardia 08/06/2012  . Penile cyst 08/06/2012  . Cancer     Past Surgical History  Procedure Laterality Date  . 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  this after several previous I and Ds of rectal abcess.   [name] node biopsy Right 08/08/2013    Procedure: SUPRACLAVICULAR LYMPH NODE BIOPSY;  Surgeon: Gaye Pollack, MD;  Location: [facility];  Service: Thoracic;  Laterality: Right;  . 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 Tylenol [Acetaminophen]     sweats     Medications: I have reviewed patients current medications as documented in Epic Anti-infectives   Start     Dose/Rate Route Frequency Ordered Stop   01/20/14 2200  emtricitabine (EMTRIVA) capsule 200 mg  Status:  Discontinued     200 mg Oral every 72 hours 01/18/14 1714  01/19/14 0802   01/20/14 2200  tenofovir (VIREAD) tablet 300 mg  Status:  Discontinued     300 mg Oral every 72 hours 01/18/14 1714 01/19/14 0804   01/19/14 2200  vancomycin (VANCOCIN) IVPB 1000 mg/200 mL premix  Status:  Discontinued     1,000 mg 200 mL/hr over 60 Minutes Intravenous Every 48 hours 01/18/14 0357 01/19/14 0636   01/19/14 2200  emtricitabine (EMTRIVA) capsule 200 mg    Comments:  Dose adjusted for estimated CrCl 30-49 mL/min   200 mg Oral Every 48 hours 01/19/14 0803     01/19/14 2200  tenofovir (VIREAD) tablet 300 mg  Status:  Discontinued    Comments:  Dose adjusted for estimated CrCl 30-49 mL/min   300 mg Oral Every 48 hours 01/19/14 0805 01/19/14 1537   01/19/14 1700  etravirine (INTELENCE) tablet 200 mg     200 mg Oral 2 times daily with meals 01/19/14 1537     01/19/14 1545  raltegravir (ISENTRESS) tablet 400 mg     400 mg Oral 2 times daily 01/19/14 1537     01/19/14 1400  metroNIDAZOLE (FLAGYL) tablet 500 mg     500 mg Oral 3 times per day 01/19/14 1205     01/19/14 1215  ciprofloxacin (CIPRO) tablet 500 mg     500 mg Oral 2 times daily 01/19/14 1205     01/19/14 1200  piperacillin-tazobactam (ZOSYN) IVPB 3.375 g  Status:  Discontinued  3.375 g 12.5 mL/hr over 240 Minutes Intravenous 3 times per day 01/19/14 0636 01/19/14 1205   01/19/14 1000  vancomycin (VANCOCIN) IVPB 750 mg/150 ml premix  Status:  Discontinued     750 mg 150 mL/hr over 60 Minutes Intravenous Every 24 hours 01/19/14 0636 01/19/14 1205   01/18/14 2200  efavirenz-emtricitabine-tenofovir (ATRIPLA) [phone] MG per tablet 1 tablet  Status:  Discontinued     1 tablet Oral Daily at bedtime 01/18/14 1532 01/18/14 1643   01/18/14 2200  efavirenz (SUSTIVA) capsule 600 mg  Status:  Discontinued     600 mg Oral Daily at bedtime 01/18/14 1714 01/18/14 1727   01/18/14 2200  efavirenz (SUSTIVA) tablet 600 mg  Status:  Discontinued     600 mg Oral Daily at bedtime 01/18/14 1727 01/19/14 1537   01/18/14  0600  piperacillin-tazobactam (ZOSYN) IVPB 2.25 g  Status:  Discontinued     2.25 g 100 mL/hr over 30 Minutes Intravenous 4 times per day 01/18/14 0357 01/19/14 0636   01/18/14 0400  piperacillin-tazobactam (ZOSYN) IVPB 3.375 g  Status:  Discontinued     3.375 g 100 mL/hr over 30 Minutes Intravenous  Once 01/18/14 0346 01/18/14 0351   01/18/14 0400  vancomycin (VANCOCIN) IVPB 1000 mg/200 mL premix  Status:  Discontinued     1,000 mg 200 mL/hr over 60 Minutes Intravenous  Once 01/18/14 0346 01/18/14 0351   01/18/14 0100  vancomycin (VANCOCIN) IVPB 1000 mg/200 mL premix     1,000 mg 200 mL/hr over 60 Minutes Intravenous  Once 01/18/14 0053 01/18/14 0248   01/18/14 0100  piperacillin-tazobactam (ZOSYN) IVPB 3.375 g     3.375 g 100 mL/hr over 30 Minutes Intravenous  Once 01/18/14 0053 01/18/14 0142      Social History:  reports that he has quit smoking. His smoking use included Cigarettes. He started smoking about 4 months ago. He has a 15 pack-year smoking history. He has never used smokeless tobacco. He reports that he does not drink alcohol or use illicit drugs.  History reviewed. No pertinent family history.  As in HPI and primary teams notes otherwise 12 point review of systems is negative  Blood pressure 105/76, pulse 67, temperature 97.7 F (36.5 C), temperature source Oral, resp. rate 26, height $RemoveBe'6\' 1"'ecmYCzHNt$  (1.854 m), weight 147 lb 7.8 oz (66.9 kg), SpO2 100.00%. General: Alert and awake, oriented x3, not in any acute distress was laughing and joking with RN HEENT: anicteric sclera, pupils reactive to light and accommodation, EOMI, G: soft nd, diffusely tender Skin: no rashes Neuro: nonfocal,   Results for orders placed during the hospital encounter of 01/17/14 (from the past 48 hour(s))  CBC WITH DIFFERENTIAL     Status: Abnormal   Collection Time    01/17/14 11:45 PM      Result Value Ref Range   WBC 2.0 (*) 4.0 - 10.5 K/uL   RBC 2.41 (*) 4.22 - 5.81 MIL/uL   Hemoglobin 8.5 (*)  13.0 - 17.0 g/dL   HCT 24.0 (*) 39.0 - 52.0 %   MCV 99.6  78.0 - 100.0 fL   MCH 35.3 (*) 26.0 - 34.0 pg   MCHC 35.4  30.0 - 36.0 g/dL   RDW 16.2 (*) 11.5 - 15.5 %   Platelets 22 (*) 150 - 400 K/uL   Comment: SPECIMEN CHECKED FOR CLOTS     REPEATED TO VERIFY     CRITICAL RESULT CALLED TO, READ BACK BY AND VERIFIED WITH:     ADENNIS  RN AT 0004 ON [id number] BY DLONG   Neutrophils Relative % 76  43 - 77 %   Lymphocytes Relative 18  12 - 46 %   Monocytes Relative 5  3 - 12 %   Eosinophils Relative 0  0 - 5 %   Basophils Relative 1  0 - 1 %   Neutro Abs 1.5 (*) 1.7 - 7.7 K/uL   Lymphs Abs 0.4 (*) 0.7 - 4.0 K/uL   Monocytes Absolute 0.1  0.1 - 1.0 K/uL   Eosinophils Absolute 0.0  0.0 - 0.7 K/uL   Basophils Absolute 0.0  0.0 - 0.1 K/uL   RBC Morphology ELLIPTOCYTES     WBC Morphology INCREASED BANDS (>20% BANDS)    COMPREHENSIVE METABOLIC PANEL     Status: Abnormal   Collection Time    01/17/14 11:45 PM      Result Value Ref Range   Sodium 129 (*) 137 - 147 mEq/L   Potassium 4.6  3.7 - 5.3 mEq/L   Chloride 97  96 - 112 mEq/L   CO2 12 (*) 19 - 32 mEq/L   Glucose, Bld 88  70 - 99 mg/dL   BUN 61 (*) 6 - 23 mg/dL   Creatinine, Ser 5.12 (*) 0.50 - 1.35 mg/dL   Calcium 7.1 (*) 8.4 - 10.5 mg/dL   Total Protein 6.7  6.0 - 8.3 g/dL   Albumin 2.8 (*) 3.5 - 5.2 g/dL   AST 121 (*) 0 - 37 U/L   ALT 123 (*) 0 - 53 U/L   Alkaline Phosphatase 119 (*) 39 - 117 U/L   Total Bilirubin 0.5  0.3 - 1.2 mg/dL   GFR calc non Af Amer 14 (*) >90 mL/min   GFR calc Af Amer 16 (*) >90 mL/min   Comment: (NOTE)     The eGFR has been calculated using the CKD EPI equation.     This calculation has not been validated in all clinical situations.     eGFR's persistently <90 mL/min signify possible Chronic Kidney     Disease.  CULTURE, BLOOD (ROUTINE X 2)     Status: None   Collection Time    01/18/14 12:55 AM      Result Value Ref Range   Specimen Description BLOOD LEFT ARM     Special Requests BOTTLES DRAWN  AEROBIC AND ANAEROBIC 5CC     Culture  Setup Time       Value: 01/18/2014 16:18     Performed at [facility]   Culture       Value:        BLOOD CULTURE RECEIVED NO GROWTH TO DATE CULTURE WILL BE HELD FOR 5 DAYS BEFORE ISSUING A FINAL NEGATIVE REPORT     Performed at [facility]   Report Status PENDING    I-STAT CG4 LACTIC ACID, ED     Status: Abnormal   Collection Time    01/18/14  1:08 AM      Result Value Ref Range   Lactic Acid, Venous <0.30 (*) 0.5 - 2.2 mmol/L  CULTURE, BLOOD (ROUTINE X 2)     Status: None   Collection Time    01/18/14  1:16 AM      Result Value Ref Range   Specimen Description BLOOD LEFT ANTECUBITAL     Special Requests BOTTLES DRAWN AEROBIC AND ANAEROBIC 2CC     Culture  Setup Time       Value: 01/18/2014 16:19  Performed at [facility]   Culture       Value:        BLOOD CULTURE RECEIVED NO GROWTH TO DATE CULTURE WILL BE HELD FOR 5 DAYS BEFORE ISSUING A FINAL NEGATIVE REPORT     Performed at [facility]   Report Status PENDING    I-STAT CG4 LACTIC ACID, ED     Status: Abnormal   Collection Time    01/18/14  3:03 AM      Result Value Ref Range   Lactic Acid, Venous 0.40 (*) 0.5 - 2.2 mmol/L  I-STAT CHEM 8, ED     Status: Abnormal   Collection Time    01/18/14  3:03 AM      Result Value Ref Range   Sodium 138  137 - 147 mEq/L   Potassium 3.5 (*) 3.7 - 5.3 mEq/L   Chloride 109  96 - 112 mEq/L   BUN 60 (*) 6 - 23 mg/dL   Creatinine, Ser 4.70 (*) 0.50 - 1.35 mg/dL   Glucose, Bld 86  70 - 99 mg/dL   Calcium, Ion 0.92 (*) 1.12 - 1.23 mmol/L   TCO2 9  0 - 100 mmol/L   Hemoglobin 6.5 (*) 13.0 - 17.0 g/dL   HCT 19.0 (*) 39.0 - 52.0 %   Comment NOTIFIED PHYSICIAN    URINALYSIS, ROUTINE W REFLEX MICROSCOPIC     Status: Abnormal   Collection Time    01/18/14  3:42 AM      Result Value Ref Range   Color, Urine YELLOW  YELLOW   APPearance TURBID (*) CLEAR   Specific Gravity, Urine 1.011  1.005 - 1.030   pH 5.5  5.0  - 8.0   Glucose, UA NEGATIVE  NEGATIVE mg/dL   Hgb urine dipstick LARGE (*) NEGATIVE   Bilirubin Urine NEGATIVE  NEGATIVE   Ketones, ur NEGATIVE  NEGATIVE mg/dL   Protein, ur 100 (*) NEGATIVE mg/dL   Urobilinogen, UA 0.2  0.0 - 1.0 mg/dL   Nitrite NEGATIVE  NEGATIVE   Leukocytes, UA LARGE (*) NEGATIVE  URINE CULTURE     Status: None   Collection Time    01/18/14  3:42 AM      Result Value Ref Range   Specimen Description URINE, RANDOM     Special Requests Immunocompromised     Culture  Setup Time       Value: 01/18/2014 16:29     Performed at [facility]       Value: NO GROWTH     Performed at [facility]   Culture       Value: NO GROWTH     Performed at [facility]   Report Status 01/19/2014 FINAL    URINE MICROSCOPIC-ADD ON     Status: Abnormal   Collection Time    01/18/14  3:42 AM      Result Value Ref Range   WBC, UA TOO NUMEROUS TO COUNT  <3 WBC/hpf   RBC / HPF TOO NUMEROUS TO COUNT  <3 RBC/hpf   Bacteria, UA FEW (*) RARE   Casts RED CELL CAST (*) NEGATIVE   Crystals CA OXALATE CRYSTALS (*) NEGATIVE  CORTISOL     Status: None   Collection Time    01/18/14  5:11 AM      Result Value Ref Range   Cortisol, Plasma 47.2     Comment: (NOTE)     AM:  4.3 -  22.4 ug/dL     PM:  3.1 - 16.7 ug/dL     Performed at [facility]  TROPONIN I     Status: None   Collection Time    01/18/14  5:14 AM      Result Value Ref Range   Troponin I <0.30  <0.30 ng/mL   Comment:            Due to the release kinetics of cTnI,     a negative result within the first hours     of the onset of symptoms does not rule out     myocardial infarction with certainty.     If myocardial infarction is still suspected,     repeat the test at appropriate intervals.  PROTIME-INR     Status: Abnormal   Collection Time    01/18/14  5:14 AM      Result Value Ref Range   Prothrombin Time 17.4 (*) 11.6 - 15.2 seconds   INR 1.42  0.00 - 1.49  APTT      Status: Abnormal   Collection Time    01/18/14  5:14 AM      Result Value Ref Range   aPTT 40 (*) 24 - 37 seconds   Comment:            IF BASELINE aPTT IS ELEVATED,     SUGGEST PATIENT RISK ASSESSMENT     BE USED TO DETERMINE APPROPRIATE     ANTICOAGULANT THERAPY.  FIBRINOGEN     Status: Abnormal   Collection Time    01/18/14  5:14 AM      Result Value Ref Range   Fibrinogen 501 (*) 204 - 475 mg/dL  TYPE AND SCREEN     Status: None   Collection Time    01/18/14  5:16 AM      Result Value Ref Range   ABO/RH(D) O POS     Antibody Screen NEG     Sample Expiration 01/21/2014     Unit Number [id number]     Blood Component Type RED CELLS,LR     Unit division 00     Status of Unit ISSUED     Transfusion Status OK TO TRANSFUSE     Crossmatch Result Compatible     Unit Number [id number]     Blood Component Type RBC LR PHER2     Unit division 00     Status of Unit ALLOCATED     Transfusion Status OK TO TRANSFUSE     Crossmatch Result Compatible    PREPARE RBC (CROSSMATCH)     Status: None   Collection Time    01/18/14  5:17 AM      Result Value Ref Range   Order Confirmation ORDER PROCESSED BY BLOOD BANK    CBC     Status: Abnormal   Collection Time    01/18/14  5:20 AM      Result Value Ref Range   WBC 4.8  4.0 - 10.5 K/uL   RBC 2.38 (*) 4.22 - 5.81 MIL/uL   Hemoglobin 8.4 (*) 13.0 - 17.0 g/dL   Comment: DELTA CHECK NOTED     REPEATED TO VERIFY   HCT 23.5 (*) 39.0 - 52.0 %   MCV 98.7  78.0 - 100.0 fL   MCH 35.3 (*) 26.0 - 34.0 pg   MCHC 35.7  30.0 - 36.0 g/dL   RDW 16.3 (*) 11.5 - 15.5 %   Platelets 27 (*) 150 -  400 K/uL   Comment: REPEATED TO VERIFY     SPECIMEN CHECKED FOR CLOTS     CONSISTENT WITH PREVIOUS RESULT     CRITICAL VALUE NOTED.  VALUE IS CONSISTENT WITH PREVIOUSLY REPORTED AND CALLED VALUE.  BASIC METABOLIC PANEL     Status: Abnormal   Collection Time    01/18/14  5:20 AM      Result Value Ref Range   Sodium 133 (*) 137 - 147 mEq/L    Potassium 4.0  3.7 - 5.3 mEq/L   Chloride 105  96 - 112 mEq/L   CO2 9 (*) 19 - 32 mEq/L   Comment: CRITICAL RESULT CALLED TO, READ BACK BY AND VERIFIED WITH:     DENNIS,A/ED _0  ON 01/18/14 BY KARCZEWSKI,S.   Glucose, Bld 164 (*) 70 - 99 mg/dL   BUN 58 (*) 6 - 23 mg/dL   Creatinine, Ser 4.36 (*) 0.50 - 1.35 mg/dL   Calcium 6.4 (*) 8.4 - 10.5 mg/dL   Comment: CRITICAL RESULT CALLED TO, READ BACK BY AND VERIFIED WITH:     DENNIS,A/ED _1  ON 01/18/14 BY KARCZEWSKI,S.   GFR calc non Af Amer 17 (*) >90 mL/min   GFR calc Af Amer 19 (*) >90 mL/min   Comment: (NOTE)     The eGFR has been calculated using the CKD EPI equation.     This calculation has not been validated in all clinical situations.     eGFR's persistently <90 mL/min signify possible Chronic Kidney     Disease.  MAGNESIUM     Status: None   Collection Time    01/18/14  5:20 AM      Result Value Ref Range   Magnesium 1.5  1.5 - 2.5 mg/dL  PHOSPHORUS     Status: None   Collection Time    01/18/14  5:20 AM      Result Value Ref Range   Phosphorus 4.0  2.3 - 4.6 mg/dL  BLOOD GAS, ARTERIAL     Status: Abnormal   Collection Time    01/18/14  6:00 AM      Result Value Ref Range   O2 Content 3.0     Delivery systems NASAL CANNULA     pH, Arterial 7.259 (*) 7.350 - 7.450   pCO2 arterial 20.4 (*) 35.0 - 45.0 mmHg   pO2, Arterial 55.2 (*) 80.0 - 100.0 mmHg   Bicarbonate 8.8 (*) 20.0 - 24.0 mEq/L   TCO2 8.6  0 - 100 mmol/L   Acid-base deficit 16.8 (*) 0.0 - 2.0 mmol/L   O2 Saturation 82.7     Patient temperature 98.6     Collection site RIGHT RADIAL     Drawn by [id number]     Sample type ARTERIAL DRAW     Allens test (pass/fail) PASS  PASS  MRSA PCR SCREENING     Status: None   Collection Time    01/18/14  7:11 AM      Result Value Ref Range   MRSA by PCR NEGATIVE  NEGATIVE   Comment:            The GeneXpert MRSA Assay (FDA     approved for NASAL specimens     only), is one component of a     comprehensive MRSA  colonization     surveillance program. It is not     intended to diagnose MRSA     infection nor to guide or     monitor treatment for  MRSA infections.  SODIUM, URINE, RANDOM     Status: None   Collection Time    01/18/14  9:16 AM      Result Value Ref Range   Sodium, Ur 51     Comment: Performed at [facility], URINE, RANDOM     Status: None   Collection Time    01/18/14  9:16 AM      Result Value Ref Range   Creatinine, Urine 24.6     Comment: Performed at [place] ACID, PLASMA     Status: None   Collection Time    01/18/14 10:00 AM      Result Value Ref Range   Lactic Acid, Venous 0.6  0.5 - 2.2 mmol/L  PROCALCITONIN     Status: None   Collection Time    01/18/14 10:00 AM      Result Value Ref Range   Procalcitonin >175.00     Comment:            Interpretation:     PCT >= 10 ng/mL:     Important systemic inflammatory response,     almost exclusively due to severe bacterial     sepsis or septic shock.     (NOTE)             ICU PCT Algorithm               Non ICU PCT Algorithm        ----------------------------     ------------------------------             PCT < 0.25 ng/mL                 PCT < 0.1 ng/mL         Stopping of antibiotics            Stopping of antibiotics           strongly encouraged.               strongly encouraged.        ----------------------------     ------------------------------           PCT level decrease by               PCT < 0.25 ng/mL           >= 80% from peak PCT           OR PCT 0.25 - 0.5 ng/mL          Stopping of antibiotics                                                 encouraged.         Stopping of antibiotics               encouraged.        ----------------------------     ------------------------------           PCT level decrease by              PCT >= 0.25 ng/mL           < 80% from peak PCT            AND PCT >= 0.5 ng/mL  Continuing antibiotics                                                   encouraged.           Continuing antibiotics                encouraged.        ----------------------------     ------------------------------         PCT level increase compared          PCT > 0.5 ng/mL             with peak PCT AND              PCT >= 0.5 ng/mL             Escalation of antibiotics                                              strongly encouraged.          Escalation of antibiotics            strongly encouraged.  CLOSTRIDIUM DIFFICILE BY PCR     Status: None   Collection Time    01/18/14 11:24 AM      Result Value Ref Range   C difficile by pcr NEGATIVE  NEGATIVE   Comment: Performed at [facility]     Status: Abnormal   Collection Time    01/18/14 12:05 PM      Result Value Ref Range   Total hemoglobin 7.4 (*) 13.5 - 18.0 g/dL   O2 Saturation 63.9     Carboxyhemoglobin 1.6 (*) 0.5 - 1.5 %   Methemoglobin 2.2 (*) 0.0 - 1.5 %  BASIC METABOLIC PANEL     Status: Abnormal   Collection Time    01/18/14  3:50 PM      Result Value Ref Range   Sodium 136 (*) 137 - 147 mEq/L   Potassium 4.5  3.7 - 5.3 mEq/L   Chloride 108  96 - 112 mEq/L   CO2 11 (*) 19 - 32 mEq/L   Glucose, Bld 106 (*) 70 - 99 mg/dL   BUN 57 (*) 6 - 23 mg/dL   Creatinine, Ser 3.75 (*) 0.50 - 1.35 mg/dL   Calcium 6.9 (*) 8.4 - 10.5 mg/dL   GFR calc non Af Amer 20 (*) >90 mL/min   GFR calc Af Amer 23 (*) >90 mL/min   Comment: (NOTE)     The eGFR has been calculated using the CKD EPI equation.     This calculation has not been validated in all clinical situations.     eGFR's persistently <90 mL/min signify possible Chronic Kidney     Disease.  BLOOD GAS, VENOUS     Status: Abnormal   Collection Time    01/18/14  7:00 PM      Result Value Ref Range   FIO2 0.30     Delivery systems BILEVEL POSITIVE AIRWAY PRESSURE     Mode PRESSURE CONTROL     Rate 14     Peep/cpap 5.0     Pressure control 5.0     pH, Ven 7.252  7.250 - 7.300    pCO2, Ven 28.4 (*) 45.0 - 50.0 mmHg   pO2, Ven 31.3  30.0 - 45.0 mmHg   Comment: CRITICAL RESULT CALLED TO, READ BACK BY AND VERIFIED WITH:     DR Z, AT 1915 BY MJACKSON,RRT,[place] [id number]   Bicarbonate 12.3 (*) 20.0 - 24.0 mEq/L   TCO2 11.8  0 - 100 mmol/L   Acid-base deficit 13.7 (*) 0.0 - 2.0 mmol/L   O2 Saturation 51.5     Patient temperature 97.0     Collection site LEFT RADIAL     Drawn by [id number]     Sample type VENOUS    PROCALCITONIN     Status: None   Collection Time    01/19/14  5:00 AM      Result Value Ref Range   Procalcitonin 131.45     Comment:            Interpretation:     PCT >= 10 ng/mL:     Important systemic inflammatory response,     almost exclusively due to severe bacterial     sepsis or septic shock.     (NOTE)             ICU PCT Algorithm               Non ICU PCT Algorithm        ----------------------------     ------------------------------             PCT < 0.25 ng/mL                 PCT < 0.1 ng/mL         Stopping of antibiotics            Stopping of antibiotics           strongly encouraged.               strongly encouraged.        ----------------------------     ------------------------------           PCT level decrease by               PCT < 0.25 ng/mL           >= 80% from peak PCT           OR PCT 0.25 - 0.5 ng/mL          Stopping of antibiotics                                                 encouraged.         Stopping of antibiotics               encouraged.        ----------------------------     ------------------------------           PCT level decrease by              PCT >= 0.25 ng/mL           < 80% from peak PCT            AND PCT >= 0.5 ng/mL            Continuing antibiotics  encouraged.           Continuing antibiotics                encouraged.        ----------------------------     ------------------------------         PCT level increase compared          PCT > 0.5  ng/mL             with peak PCT AND              PCT >= 0.5 ng/mL             Escalation of antibiotics                                              strongly encouraged.          Escalation of antibiotics            strongly encouraged.  CBC     Status: Abnormal   Collection Time    01/19/14  5:00 AM      Result Value Ref Range   WBC 4.3  4.0 - 10.5 K/uL   RBC 1.78 (*) 4.22 - 5.81 MIL/uL   Hemoglobin 6.3 (*) 13.0 - 17.0 g/dL   Comment: REPEATED TO VERIFY     CRITICAL RESULT CALLED TO, READ BACK BY AND VERIFIED WITH:     M. MORGAN RN AT 0630 ON 06.29.15 BY SHUEA   HCT 17.2 (*) 39.0 - 52.0 %   MCV 96.6  78.0 - 100.0 fL   MCH 35.4 (*) 26.0 - 34.0 pg   MCHC 36.6 (*) 30.0 - 36.0 g/dL   RDW 16.0 (*) 11.5 - 15.5 %   Platelets 16 (*) 150 - 400 K/uL   Comment: SPECIMEN CHECKED FOR CLOTS     REPEATED TO VERIFY     DELTA CHECK NOTED     PLATELET COUNT CONFIRMED BY SMEAR     CRITICAL RESULT CALLED TO, READ BACK BY AND VERIFIED WITH:     Lucila Maine RN AT 0630 ON 06.29.15 BY SHUEA  COMPREHENSIVE METABOLIC PANEL     Status: Abnormal   Collection Time    01/19/14  5:00 AM      Result Value Ref Range   Sodium 141  137 - 147 mEq/L   Potassium 3.0 (*) 3.7 - 5.3 mEq/L   Comment: DELTA CHECK NOTED     REPEATED TO VERIFY   Chloride 108  96 - 112 mEq/L   CO2 15 (*) 19 - 32 mEq/L   Glucose, Bld 103 (*) 70 - 99 mg/dL   BUN 50 (*) 6 - 23 mg/dL   Creatinine, Ser 3.37 (*) 0.50 - 1.35 mg/dL   Calcium 6.8 (*) 8.4 - 10.5 mg/dL   Total Protein 5.3 (*) 6.0 - 8.3 g/dL   Albumin 2.1 (*) 3.5 - 5.2 g/dL   AST 33  0 - 37 U/L   ALT 63 (*) 0 - 53 U/L   Alkaline Phosphatase 76  39 - 117 U/L   Total Bilirubin 0.4  0.3 - 1.2 mg/dL   GFR calc non Af Amer 23 (*) >90 mL/min   GFR calc Af Amer 27 (*) >90 mL/min   Comment: (NOTE)     The eGFR has been calculated using the CKD EPI equation.  This calculation has not been validated in all clinical situations.     eGFR's persistently <90 mL/min signify possible  Chronic Kidney     Disease.  MAGNESIUM     Status: None   Collection Time    01/19/14  5:00 AM      Result Value Ref Range   Magnesium 1.6  1.5 - 2.5 mg/dL  PREPARE RBC (CROSSMATCH)     Status: None   Collection Time    01/19/14  7:00 AM      Result Value Ref Range   Order Confirmation ORDER PROCESSED BY BLOOD BANK        Component Value Date/Time   SDES URINE, RANDOM 01/18/2014 0342   SPECREQUEST Immunocompromised 01/18/2014 0342   CULT  Value: NO GROWTH Performed at [facility] 01/18/2014 0342   REPTSTATUS 01/19/2014 FINAL 01/18/2014 0342   Dg Chest Port 1 View  01/19/2014   CLINICAL DATA:  Pneumonia.  EXAM: PORTABLE CHEST - 1 VIEW  COMPARISON:  Single view of the chest 01/18/2014. CT chest, abdomen and pelvis 08/04/2013.  FINDINGS: The patient is rotated on the study. Bilateral alveolar and interstitial opacities seen on the comparison studies has improved. Heart size is normal. No pneumothorax or pleural effusion is identified.  IMPRESSION: Improved aeration compatible with resolving pneumonia and/or edema.   Electronically Signed   By: Inge Rise M.D.   On: 01/19/2014 07:42   Dg Chest Port 1 View  01/18/2014   CLINICAL DATA:  Central line placement  EXAM: PORTABLE CHEST - 1 VIEW  COMPARISON:  01/08/2014  FINDINGS: Cardiomediastinal silhouette is stable. Persistent streaky bilateral interstitial prominence and patchy airspace disease consistent with asymmetric edema or pneumonia. Right Port-A-Cath is unchanged in position. There is a left IJ central line with tip in SVC. No pneumothorax.  IMPRESSION: Persistent streaky bilateral interstitial prominence and patchy airspace disease consistent with asymmetric edema or pneumonia. Right Port-A-Cath is unchanged in position. There is a left IJ central line with tip in SVC. No pneumothorax.   Electronically Signed   By: Lahoma Crocker M.D.   On: 01/18/2014 10:01   Dg Chest Portable 1 View  01/18/2014   CLINICAL DATA:  Volume overload.   Shortness of breath, hypotension  EXAM: PORTABLE CHEST - 1 VIEW  COMPARISON:  Chest x-ray from the same day at 1:36 a.m.  FINDINGS: Right IJ porta catheter in stable position. Normal heart size. No change in upper mediastinal contours when accounting for portable technique.  There is new interstitial and airspace disease which is asymmetric to the peripheral right chest.  IMPRESSION: New lung opacities, right more than left. Rapid development favors asymmetric edema, acute lung injury, or aspiration pneumonitis.   Electronically Signed   By: Jorje Guild M.D.   On: 01/18/2014 06:36   Dg Chest Port 1 View  01/18/2014   CLINICAL DATA:  Hypotension  EXAM: PORTABLE CHEST - 1 VIEW  COMPARISON:  08/02/2013  FINDINGS: Right IJ porta catheter, tip at the upper cavoatrial junction.  Normal heart size and mediastinal contours. No consolidation, edema, effusion, or pneumothorax. Remote surgical site at the right neck base.  IMPRESSION: No active disease.   Electronically Signed   By: Jorje Guild M.D.   On: 01/18/2014 02:22     Recent Results (from the past 720 hour(s))  CULTURE, BLOOD (ROUTINE X 2)     Status: None   Collection Time    01/18/14 12:55 AM      Result Value Ref  Range Status   Specimen Description BLOOD LEFT ARM   Final   Special Requests BOTTLES DRAWN AEROBIC AND ANAEROBIC 5CC   Final   Culture  Setup Time     Final   Value: 01/18/2014 16:18     Performed at [facility]   Culture     Final   Value:        BLOOD CULTURE RECEIVED NO GROWTH TO DATE CULTURE WILL BE HELD FOR 5 DAYS BEFORE ISSUING A FINAL NEGATIVE REPORT     Performed at [facility]   Report Status PENDING   Incomplete  CULTURE, BLOOD (ROUTINE X 2)     Status: None   Collection Time    01/18/14  1:16 AM      Result Value Ref Range Status   Specimen Description BLOOD LEFT ANTECUBITAL   Final   Special Requests BOTTLES DRAWN AEROBIC AND ANAEROBIC 2CC   Final   Culture  Setup Time     Final   Value:  01/18/2014 16:19     Performed at [facility]   Culture     Final   Value:        BLOOD CULTURE RECEIVED NO GROWTH TO DATE CULTURE WILL BE HELD FOR 5 DAYS BEFORE ISSUING A FINAL NEGATIVE REPORT     Performed at [facility]   Report Status PENDING   Incomplete  URINE CULTURE     Status: None   Collection Time    01/18/14  3:42 AM      Result Value Ref Range Status   Specimen Description URINE, RANDOM   Final   Special Requests Immunocompromised   Final   Culture  Setup Time     Final   Value: 01/18/2014 16:29     Performed at [facility]     Final   Value: NO GROWTH     Performed at [facility]   Culture     Final   Value: NO GROWTH     Performed at [facility]   Report Status 01/19/2014 FINAL   Final  MRSA PCR SCREENING     Status: None   Collection Time    01/18/14  7:11 AM      Result Value Ref Range Status   MRSA by PCR NEGATIVE  NEGATIVE Final   Comment:            The GeneXpert MRSA Assay (FDA     approved for NASAL specimens     only), is one component of a     comprehensive MRSA colonization     surveillance program. It is not     intended to diagnose MRSA     infection nor to guide or     monitor treatment for     MRSA infections.  CLOSTRIDIUM DIFFICILE BY PCR     Status: None   Collection Time    01/18/14 11:24 AM      Result Value Ref Range Status   C difficile by pcr NEGATIVE  NEGATIVE Final   Comment: Performed at [facility]     Impression/Recommendation  Active Problems:   Septic shock   Thomas [name] is a 33 y.o. male with  HIV, Hodgkins lymphoma on ABVD chemotherpay admitted with severe diarrhea, hypotension, AKI  #1 Severe diarrhea: seems to be improving on Ciprofloxacin. CDI was negative. --continue supportive care --continue cipro for now --followup stool studies  #  2 HIV: has always been highly compliant and has very involved mother with whom I spent great deal of time  on the phone. His HIV is undoubtedly still controlled though his CD4 could be low secondary to chemotherapy  Given the AKI, I will change him to a more "kidney friendly regimen" and take him off TNF and change to   Isentress 444m BID  Intelence 2052mBID (could also be given as 40060maily)  And Emtricitabine every other day  Will check CD4 to see if he might not now need PCP prophylaxis due to chemotherapy  I spent greater than 40 minutes with the patient including greater than 50% of time in face to face counsel of the patient and in coordination of their care.   #3 AKI: likely due to his severe diarrhea and dehydration --continue supportive care and willl change off of TNF     01/19/2014, 3:43 PM   Thank you so much for this interesting consult  [phone]) 01/19/2014, 3:43 PM  CorRhina Brackettm 01/19/2014, 3:43 PM

## 2014-01-19 NOTE — Progress Notes (Signed)
CRITICAL VALUE ALERT  Critical value received:  Hemoglobin 6.3 and platelets 16  Date of notification:  01/19/14  Time of notification:  0630  Critical value read back:Yes.    Nurse who received alert:  Manuela Neptune    MD notified (1st page): Dr. Jimmy Footman  Time of first page:  [phone]    Responding MD:  Dr. Jimmy Footman Time MD responded:  [phone]

## 2014-01-19 NOTE — Progress Notes (Signed)
INITIAL NUTRITION ASSESSMENT  DOCUMENTATION CODES Per approved criteria  -Not Applicable   INTERVENTION: - Resource Breeze TID - Diet advancement per MD - RD to continue to monitor   NUTRITION DIAGNOSIS: Increased nutrient needs related to HIV with lymphoma as evidenced by MD notes.   Goal: Advance diet as tolerated to regular diet Resolution of diarrhea  Monitor:  Weights, labs, diet advancement, diarrhea  Reason for Assessment: RN request  33 y.o. male  Admitting Dx: Diarrhea, dizziness   ASSESSMENT: Pt with HIV on antiretroviral therapy and Hodgkin's lymphoma IIIA diagnosed in January 2015 and on chemotherapy with last cycle given 01/16/14. Presents with severe diarrhea, dizziness and weakness. In the ED found to be hypotensive with no response in BP after 8 L IVF. RD contacted by RN to see as pt with poor intake.   - Pt has been seen by outpatient [facility] RD - Reports not eating anything since Friday PTA but had some broth today - Prior to Friday he was eating 2-3 meals/day and drinking 3 Ensure/day - Weight up 14 pounds in the past month - Denies any nausea/vomiting today and reports having 1-2 episodes of diarrhea today, improved from 10 times/day over the weekend - Pt under the covers and falling asleep at end of visit, did not perform nutrition focused physical exam  Potassium low, getting oral replacement ALT elevated   Height: Ht Readings from Last 1 Encounters:  01/18/14 6\' 1"  (1.854 m)    Weight: Wt Readings from Last 1 Encounters:  01/19/14 147 lb 7.8 oz (66.9 kg)    Ideal Body Weight: 184 lbs   % Ideal Body Weight: 80%  Wt Readings from Last 10 Encounters:  01/19/14 147 lb 7.8 oz (66.9 kg)  01/16/14 135 lb 14.4 oz (61.644 kg)  01/02/14 138 lb 9.6 oz (62.869 kg)  12/19/13 133 lb 14.4 oz (60.737 kg)  12/05/13 129 lb 3.2 oz (58.605 kg)  11/20/13 124 lb (56.246 kg)  10/31/13 120 lb 11.2 oz (54.749 kg)  10/17/13 113 lb 8 oz  (51.483 kg)  09/30/13 112 lb 9.6 oz (51.075 kg)  09/01/13 119 lb 8 oz (54.205 kg)    Usual Body Weight: 133 lbs last month  % Usual Body Weight: 111%  BMI:  Body mass index is 19.46 kg/(m^2).  Estimated Nutritional Needs: Kcal: 2100-2300 Protein: 100-120g Fluid: 2.1-2.3L/day   Skin: Intact   Diet Order: Clear Liquid  EDUCATION NEEDS: -No education needs identified at this time   Intake/Output Summary (Last 24 hours) at 01/19/14 1703 Last data filed at 01/19/14 1400  Gross per 24 hour  Intake 5762.5 ml  Output   3265 ml  Net 2497.5 ml    Last BM: PTA  Labs:   Recent Labs Lab 01/18/14 0303 01/18/14 0520 01/18/14 1550 01/19/14 0500  NA 138 133* 136* 141  K 3.5* 4.0 4.5 3.0*  CL 109 105 108 108  CO2  --  9* 11* 15*  BUN 60* 58* 57* 50*  CREATININE 4.70* 4.36* 3.75* 3.37*  CALCIUM  --  6.4* 6.9* 6.8*  MG  --  1.5  --  1.6  PHOS  --  4.0  --   --   GLUCOSE 86 164* 106* 103*    CBG (last 3)  No results found for this basename: GLUCAP,  in the last 72 hours  Scheduled Meds: . antiseptic oral rinse  15 mL Mouth Rinse q12n4p  . chlorhexidine  15 mL Mouth Rinse BID  .  ciprofloxacin  500 mg Oral BID  . emtricitabine  200 mg Oral Q48H  . etravirine  200 mg Oral BID WC  . hydrocortisone sod succinate (SOLU-CORTEF) inj  50 mg Intravenous Q12H  . metroNIDAZOLE  500 mg Oral 3 times per day  . raltegravir  400 mg Oral BID    Continuous Infusions: . sodium chloride 1,000 mL (01/19/14 0400)    Past Medical History  Diagnosis Date  . HIV disease 06/25/2012  . Skin lesion 07/03/2012  . Leg pain 07/03/2012  . Sinus tachycardia 08/06/2012  . Penile cyst 08/06/2012  . Cancer     Past Surgical History  Procedure Laterality Date  . 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  this after several previous I and Ds of rectal abcess.   [name] node biopsy Right 08/08/2013    Procedure:  SUPRACLAVICULAR LYMPH NODE BIOPSY;  Surgeon: Gaye Pollack, MD;  Location: [facility];  Service: Thoracic;  Laterality: Right;  . 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

## 2014-01-19 NOTE — Progress Notes (Signed)
ANTIBIOTIC CONSULT NOTE - FOLLOW UP  Pharmacy Consult for Vancomycin and Zosyn Indication: Septic shock  Allergies  Allergen Reactions  . Tylenol [Acetaminophen]     sweats    Patient Measurements: Height: 6\' 1"  (185.4 cm) Weight: 147 lb 7.8 oz (66.9 kg) IBW/kg (Calculated) : 79.9   Vital Signs: Temp: 97.6 F (36.4 C) (06/29 0400) Temp src: Oral (06/29 0300) BP: 92/62 mmHg (06/29 0700) Pulse Rate: 80 (06/29 0700) Intake/Output from previous day: 06/28 0701 - 06/29 0700 In: 7539.2 [I.V.:7339.2; IV Piggyback:200] Out: 4090 [Urine:4090] Intake/Output from this shift: Total I/O In: 50 [IV Piggyback:50] Out: -   Labs:  Recent Labs  01/17/14 2345 01/18/14 0303 01/18/14 0520 01/18/14 0916 01/18/14 1550 01/19/14 0500  WBC 2.0*  --  4.8  --   --  4.3  HGB 8.5* 6.5* 8.4*  --   --  6.3*  PLT 22*  --  27*  --   --  16*  LABCREA  --   --   --  24.6  --   --   CREATININE 5.12* 4.70* 4.36*  --  3.75* 3.37*   Estimated Creatinine Clearance: 30.3 ml/min (by C-G formula based on Cr of 3.37). No results found for this basename: VANCOTROUGH, Corlis Leak, VANCORANDOM, [place], GENTPEAK, GENTRANDOM, TOBRATROUGH, TOBRAPEAK, TOBRARND, AMIKACINPEAK, AMIKACINTROU, AMIKACIN,  in the last 72 hours   Microbiology: Recent Results (from the past 720 hour(s))  MRSA PCR SCREENING     Status: None   Collection Time    01/18/14  7:11 AM      Result Value Ref Range Status   MRSA by PCR NEGATIVE  NEGATIVE Final   Comment:            The GeneXpert MRSA Assay (FDA     approved for NASAL specimens     only), is one component of a     comprehensive MRSA colonization     surveillance program. It is not     intended to diagnose MRSA     infection nor to guide or     monitor treatment for     MRSA infections.  CLOSTRIDIUM DIFFICILE BY PCR     Status: None   Collection Time    01/18/14 11:24 AM      Result Value Ref Range Status   C difficile by pcr NEGATIVE  NEGATIVE Final   Comment:  Performed at [facility]   Start     Dose/Rate Route Frequency Ordered Stop   01/20/14 2200  emtricitabine (EMTRIVA) capsule 200 mg  Status:  Discontinued     200 mg Oral every 72 hours 01/18/14 1714 01/19/14 0802   01/20/14 2200  tenofovir (VIREAD) tablet 300 mg  Status:  Discontinued     300 mg Oral every 72 hours 01/18/14 1714 01/19/14 0804   01/19/14 2200  vancomycin (VANCOCIN) IVPB 1000 mg/200 mL premix  Status:  Discontinued     1,000 mg 200 mL/hr over 60 Minutes Intravenous Every 48 hours 01/18/14 0357 01/19/14 0636   01/19/14 2200  emtricitabine (EMTRIVA) capsule 200 mg    Comments:  Dose adjusted for estimated CrCl 30-49 mL/min   200 mg Oral Every 48 hours 01/19/14 0803     01/19/14 2200  tenofovir (VIREAD) tablet 300 mg    Comments:  Dose adjusted for estimated CrCl 30-49 mL/min   300 mg Oral Every 48 hours 01/19/14 0805     01/19/14 1200  piperacillin-tazobactam (ZOSYN) IVPB 3.375 g  3.375 g 12.5 mL/hr over 240 Minutes Intravenous 3 times per day 01/19/14 0636     01/19/14 1000  vancomycin (VANCOCIN) IVPB 750 mg/150 ml premix     750 mg 150 mL/hr over 60 Minutes Intravenous Every 24 hours 01/19/14 0636     01/18/14 2200  efavirenz-emtricitabine-tenofovir (ATRIPLA) 600-200-300 MG per tablet 1 tablet  Status:  Discontinued     1 tablet Oral Daily at bedtime 01/18/14 1532 01/18/14 1643   01/18/14 2200  efavirenz (SUSTIVA) capsule 600 mg  Status:  Discontinued     600 mg Oral Daily at bedtime 01/18/14 1714 01/18/14 1727   01/18/14 2200  efavirenz (SUSTIVA) tablet 600 mg     600 mg Oral Daily at bedtime 01/18/14 1727     01/18/14 0600  piperacillin-tazobactam (ZOSYN) IVPB 2.25 g  Status:  Discontinued     2.25 g 100 mL/hr over 30 Minutes Intravenous 4 times per day 01/18/14 0357 01/19/14 0636   01/18/14 0400  piperacillin-tazobactam (ZOSYN) IVPB 3.375 g  Status:  Discontinued     3.375 g 100 mL/hr over 30 Minutes Intravenous  Once 01/18/14 0346  01/18/14 0351   01/18/14 0400  vancomycin (VANCOCIN) IVPB 1000 mg/200 mL premix  Status:  Discontinued     1,000 mg 200 mL/hr over 60 Minutes Intravenous  Once 01/18/14 0346 01/18/14 0351   01/18/14 0100  vancomycin (VANCOCIN) IVPB 1000 mg/200 mL premix     1,000 mg 200 mL/hr over 60 Minutes Intravenous  Once 01/18/14 0053 01/18/14 0248   01/18/14 0100  piperacillin-tazobactam (ZOSYN) IVPB 3.375 g     3.375 g 100 mL/hr over 30 Minutes Intravenous  Once 01/18/14 0053 01/18/14 0142      Assessment: 33 y/o M with HIV, on ART; Hodgkin's lymphoma, on chemotherapy (received ABVD on 6/26) presented to ED with diarrhea and weakness, was found to be in shock of presumed septic origin.  Empiric Zosyn and vancomycin were ordered with pharmacy dosing assistance requested.  6/29: D#2 Zosyn 2.25 grams IV q6h / Vancomycin 1 gram IV q48h. Serum creatinine rapidly improving UOP excellent Off vasopressors Blood and urine culture pending.  Sputum culture ordered but not yet received in lab.   Goal of Therapy:  Appropriate antibiotic dosing for renal function; eradication of infection. Vancomycin trough 15-20   Plan:  1. Increase Zosyn to 3.375 grams IV q8h extended-infusion regimen (each dose over 4 hours). 2. Increase Vancomycin to 750 mg IV q24h, next dose tomorrow at 10am. 3. Vancomycin random level tomorrow AM to confirm appropriateness of increased dosage. 4. Adjust emtricitabine and tenofovir dosages to match improved renal function.  Await ID consult today for further adjustments as needed. 5. Follow serum creatinine, cultures, and clinical course.  Clayburn Pert, PharmD, BCPS Pager: [phone] 01/19/2014  8:16 AM    Absher, Julieta Bellini 01/19/2014,8:11 AM

## 2014-01-19 NOTE — Progress Notes (Signed)
eLink Physician-Brief Progress Note Patient Name: Thomas [name] DOB: January 19, 1981 MRN: [id number]  Date of Service  01/19/2014   HPI/Events of Note  plts down to 14k  Lab Results  Component Value Date   PLT 13* 01/19/2014   PLT 16* 01/19/2014   PLT 27* 01/18/2014   PLT 161 01/16/2014   PLT 170 01/02/2014   PLT 181 12/19/2013      Recent Labs Lab 01/18/14 0520 01/19/14 0500 01/19/14 2010  HGB 8.4* 6.3* 7.2*      eICU Interventions  Since actively slow blood loss and will be < 10 k likely by am rec trx now x one pheresed unit plts   Intervention Category Intermediate Interventions: Thrombocytopenia - evaluation and management  Christinia Gully 01/19/2014, 9:20 PM

## 2014-01-19 NOTE — Progress Notes (Signed)
PULMONARY / CRITICAL CARE MEDICINE   Name: Thomas [name] MRN: [id number] DOB: 07-27-1980    ADMISSION DATE:  01/17/2014  PRIMARY SERVICE: PCCM  CHIEF COMPLAINT:  Diarrhea, dizziness.   BRIEF PATIENT DESCRIPTION:  33 years old male with PMH relevant for HIV -(CD4 360 on 11/20/13 )on antiretroviral therapy and Hodgkin's lymphoma IIIA diagnosed in January 2015 and on chemotherapy (ABVD) with last cycle given 01/16/14. Presents with severe diarrhea, dizziness and weakness. In the ED found to be hypotensive with no response in BP after 8 L IVF.   SIGNIFICANT EVENTS / STUDIES:  6/27 Admitted 6/28 cxr with RUL edema vs pneumonia 6/29 Improved CXR  LINES / TUBES: - Right port - LIJ 6/28 >>  CULTURES: Blood culture 6/27 > Urine culture 6/27 > Sputum culture ordered  ANTIBIOTICS: - Zosyn 01/17/14 > 6/29 - Vancomycin 01/17/14 > 6/29 - cipro 6/29 - flagyl 6/29  SUBJECTIVE:   Afebrile overnight. Off pressors since yesterday afternoon. Off Bipap since last night. Currently on RA. Patient states he passed a kidney stone in the ED before being admitted.   VITAL SIGNS: Temp:  [96.2 F (35.7 C)-97.6 F (36.4 C)] 97.6 F (36.4 C) (06/29 0400) Pulse Rate:  [25-98] 80 (06/29 0700) Resp:  [17-29] 24 (06/29 0700) BP: (80-149)/(46-98) 92/62 mmHg (06/29 0700) SpO2:  [73 %-100 %] 100 % (06/29 0700) FiO2 (%):  [30 %] 30 % (06/28 2315) Weight:  [66.9 kg (147 lb 7.8 oz)] 66.9 kg (147 lb 7.8 oz) (06/29 0300) RA  Intake/Output     06/28 0701 - 06/29 0700 06/29 0701 - 06/30 0700   I.V. (mL/kg) 7339.2 (109.7)    IV Piggyback 200 50   Total Intake(mL/kg) 7539.2 (112.7) 50 (0.7)   Urine (mL/kg/hr) 4090 (2.5)    Total Output 4090     Net +3449.2 +50        Urine Occurrence 1 x    Stool Occurrence 3 x      PHYSICAL EXAMINATION: General: Pleasant male patient in no acute distress  Neuro: A/Ox4, follows all commands HEENT: PERRL Heart: Regular, no murmurs. Lungs: CTA Abdomen: Abdomen  soft, non-tender and not distended, normoactive bowel sounds.  Musculoskeletal: MAEs Skin: Intact  LABS:  CBC  Recent Labs Lab 01/17/14 2345 01/18/14 0303 01/18/14 0520 01/19/14 0500  WBC 2.0*  --  4.8 4.3  HGB 8.5* 6.5* 8.4* 6.3*  HCT 24.0* 19.0* 23.5* 17.2*  PLT 22*  --  27* 16*   Coag's  Recent Labs Lab 01/18/14 0514  APTT 40*  INR 1.42   BMET  Recent Labs Lab 01/18/14 0520 01/18/14 1550 01/19/14 0500  NA 133* 136* 141  K 4.0 4.5 3.0*  CL 105 108 108  CO2 9* 11* 15*  BUN 58* 57* 50*  CREATININE 4.36* 3.75* 3.37*  GLUCOSE 164* 106* 103*   Electrolytes  Recent Labs Lab 01/18/14 0520 01/18/14 1550 01/19/14 0500  CALCIUM 6.4* 6.9* 6.8*  MG 1.5  --  1.6  PHOS 4.0  --   --    Sepsis Markers  Recent Labs Lab 01/18/14 0108 01/18/14 0303 01/18/14 1000 01/19/14 0500  LATICACIDVEN <0.30* 0.40* 0.6  --   PROCALCITON  --   --  >175.00 131.45   ABG  Recent Labs Lab 01/18/14 0600  PHART 7.259*  PCO2ART 20.4*  PO2ART 55.2*   Liver Enzymes  Recent Labs Lab 01/16/14 0839 01/17/14 2345 01/19/14 0500  AST 15 121* 33  ALT 13 123* 63*  ALKPHOS 147 119*  76  BILITOT 0.22 0.5 0.4  ALBUMIN 3.2* 2.8* 2.1*   Cardiac Enzymes  Recent Labs Lab 01/18/14 0514  TROPONINI <0.30   Glucose No results found for this basename: GLUCAP,  in the last 168 hours  Imaging Dg Chest Port 1 View  01/19/2014   CLINICAL DATA:  Pneumonia.  EXAM: PORTABLE CHEST - 1 VIEW  COMPARISON:  Single view of the chest 01/18/2014. CT chest, abdomen and pelvis 08/04/2013.  FINDINGS: The patient is rotated on the study. Bilateral alveolar and interstitial opacities seen on the comparison studies has improved. Heart size is normal. No pneumothorax or pleural effusion is identified.  IMPRESSION: Improved aeration compatible with resolving pneumonia and/or edema.   Electronically Signed   By: Inge Rise M.D.   On: 01/19/2014 07:42   Dg Chest Port 1 View  01/18/2014    CLINICAL DATA:  Central line placement  EXAM: PORTABLE CHEST - 1 VIEW  COMPARISON:  01/08/2014  FINDINGS: Cardiomediastinal silhouette is stable. Persistent streaky bilateral interstitial prominence and patchy airspace disease consistent with asymmetric edema or pneumonia. Right Port-A-Cath is unchanged in position. There is a left IJ central line with tip in SVC. No pneumothorax.  IMPRESSION: Persistent streaky bilateral interstitial prominence and patchy airspace disease consistent with asymmetric edema or pneumonia. Right Port-A-Cath is unchanged in position. There is a left IJ central line with tip in SVC. No pneumothorax.   Electronically Signed   By: Lahoma Crocker M.D.   On: 01/18/2014 10:01   Dg Chest Portable 1 View  01/18/2014   CLINICAL DATA:  Volume overload.  Shortness of breath, hypotension  EXAM: PORTABLE CHEST - 1 VIEW  COMPARISON:  Chest x-ray from the same day at 1:36 a.m.  FINDINGS: Right IJ porta catheter in stable position. Normal heart size. No change in upper mediastinal contours when accounting for portable technique.  There is new interstitial and airspace disease which is asymmetric to the peripheral right chest.  IMPRESSION: New lung opacities, right more than left. Rapid development favors asymmetric edema, acute lung injury, or aspiration pneumonitis.   Electronically Signed   By: Jorje Guild M.D.   On: 01/18/2014 06:36   Dg Chest Port 1 View  01/18/2014   CLINICAL DATA:  Hypotension  EXAM: PORTABLE CHEST - 1 VIEW  COMPARISON:  08/02/2013  FINDINGS: Right IJ porta catheter, tip at the upper cavoatrial junction.  Normal heart size and mediastinal contours. No consolidation, edema, effusion, or pneumothorax. Remote surgical site at the right neck base.  IMPRESSION: No active disease.   Electronically Signed   By: Jorje Guild M.D.   On: 01/18/2014 02:22   CXR: Improving edema vs RUL pneumonia 6/29  ASSESSMENT / PLAN:  PULMONARY A: 1) acute hypoxic respiratory failure -  resolved on RA P:   - F/u ABG in AM? - Taper Steroids - decreased solu-cortef  to 50 mg BID - Maintain O2 sat >92%  CARDIOVASCULAR A:  1) Septic shock, possible source is GI given severe diarrhea. Poor response to IVF's. Normal lactate.  Off pressors, MAP >65  P:  - Goal MAPs >65 - Stress dose steroids  RENAL A:   1) Acute renal failure, - AKI - Fena 5.72% 2) Hypocalcemia  3) Hypokalemia 4) mixed metabolic acidosis related to uremia and diarrhea  Bicarb deficit 240 meq/L 6/29 H/o kidney stones  Improving Serum Cr  P:   - Will replenish lytes as indicated - PO potassium - Renal US? - D/c bicarb gtt - KVO  fluids  GASTROINTESTINAL A:   1) Severe diarrhea -improving -C-diff negative 6/28 Nutrition P:   - ADAT  - Stool GI pathogen panel  - D/c protonix  HEMATOLOGIC A:   1) Pancytopenia likely secondary to chemotherapy / sepsis Receiving 1 unit PRBCs for h/h 6.3/17.2 P:  - F/u transfusion CBC - Will transfuse for Hgb < 7 - Gets Neulasta after chemotherapy, we'll dose Neupogen as inpatient, expect counts to drop - No evidence of bleeding, will follow platelet count and transfuse for plt < 20,000  INFECTIOUS A:   1) Septic shock. Likely source is the GI tract  > acute infectious diarrhea Afebrile, neutropenic,6/29 CXR improving P:   - Zosyn D/c - Vancomycin D/c - Start cipro/flagyl for GI  Source - re-order stool cultur - Will follow cultures   ENDOCRINE A:   1) No issues P:   - Stress dose steroids  NEUROLOGIC A:   1) No issues  Summary: Patient doing well this AM on RA and off pressors. Metabolic acidosis improving - off bicarb gtt. Serum creatinine trending down and FeNa 5.72% suggesting ATN related to sepsis/hypotension, which seems to be responding to fluid resuscitation. Continue to trend serum creatinine, consider Renal U/S? Patient with recent kidney stone. Decreased diarrhea today and c-diff negative, consider changing abx to cipro/flagyl PO for  treatment of GI source. Anemia this AM requiring 1 unit PRBCs, will follow-up CBC.   Transfer to [facility], SDU, PCCM to sign off  Lalla Brothers ACNP student  Attending:  I have seen and examined the patient with nurse practitioner/resident and agree with and have edited the note above.   Roselie Awkward, MD [place] PCCM Pager: [phone] Cell: [phone] If no response, call [phone]

## 2014-01-19 NOTE — Progress Notes (Signed)
eLink Physician-Brief Progress Note Patient Name: Thomas [name] DOB: [birth year] MRN: [id number]  Date of Service  01/19/2014   HPI/Events of Note  Hgb down to 6.3 from 8.4  eICU Interventions  Plan: Transfuse 1 unit pRBC Post-transfusion CBC   Intervention Category Intermediate Interventions: Bleeding - evaluation and treatment with blood products  DETERDING,ELIZABETH 01/19/2014, 6:35 AM

## 2014-01-19 NOTE — Progress Notes (Signed)
eLink Physician-Brief Progress Note Patient Name: ABIGAIL MARSIGLIA DOB: 12-28-1980 MRN: [id number]  Date of Service  01/19/2014   HPI/Events of Note  Hypokalemia in the setting of renal insufficiency   eICU Interventions  Potassium replaced   Intervention Category Intermediate Interventions: Electrolyte abnormality - evaluation and management  DETERDING,ELIZABETH 01/19/2014, 6:58 AM

## 2014-01-20 DIAGNOSIS — J9600 Acute respiratory failure, unspecified whether with hypoxia or hypercapnia: Secondary | ICD-10-CM

## 2014-01-20 DIAGNOSIS — D709 Neutropenia, unspecified: Secondary | ICD-10-CM

## 2014-01-20 DIAGNOSIS — D61818 Other pancytopenia: Secondary | ICD-10-CM

## 2014-01-20 DIAGNOSIS — R5081 Fever presenting with conditions classified elsewhere: Secondary | ICD-10-CM

## 2014-01-20 LAB — PREPARE PLATELET PHERESIS: Unit division: 0

## 2014-01-20 LAB — BASIC METABOLIC PANEL
BUN: 34 mg/dL — AB (ref 6–23)
CALCIUM: 6.1 mg/dL — AB (ref 8.4–10.5)
CO2: 18 meq/L — AB (ref 19–32)
Chloride: 113 mEq/L — ABNORMAL HIGH (ref 96–112)
Creatinine, Ser: 2.59 mg/dL — ABNORMAL HIGH (ref 0.50–1.35)
GFR calc Af Amer: 37 mL/min — ABNORMAL LOW (ref >90)
GFR calc non Af Amer: 32 mL/min — ABNORMAL LOW (ref >90)
GLUCOSE: 91 mg/dL (ref 70–99)
Potassium: 2.8 mEq/L — CL (ref 3.7–5.3)
Sodium: 143 mEq/L (ref 137–147)

## 2014-01-20 LAB — GI PATHOGEN PANEL BY PCR, STOOL
C difficile toxin A/B: NEGATIVE
Campylobacter by PCR: NEGATIVE
Cryptosporidium by PCR: NEGATIVE
E COLI (ETEC) LT/ST: NEGATIVE
E coli (STEC): NEGATIVE
E coli 0157 by PCR: NEGATIVE
G LAMBLIA BY PCR: NEGATIVE
NOROVIRUS G1/G2: NEGATIVE
Rotavirus A by PCR: NEGATIVE
SHIGELLA BY PCR: NEGATIVE
Salmonella by PCR: NEGATIVE

## 2014-01-20 LAB — CBC
HCT: 17.2 % — ABNORMAL LOW (ref 39.0–52.0)
HCT: 18.9 % — ABNORMAL LOW (ref 39.0–52.0)
HEMOGLOBIN: 6.3 g/dL — AB (ref 13.0–17.0)
HEMOGLOBIN: 7 g/dL — AB (ref 13.0–17.0)
MCH: 35.4 pg — ABNORMAL HIGH (ref 26.0–34.0)
MCH: 34.8 pg — AB (ref 26.0–34.0)
MCHC: 36.6 g/dL — ABNORMAL HIGH (ref 30.0–36.0)
MCHC: 37 g/dL — ABNORMAL HIGH (ref 30.0–36.0)
MCV: 96.6 fL (ref 78.0–100.0)
MCV: 94 fL (ref 78.0–100.0)
Platelets: 16 10*3/uL — CL (ref 150–400)
Platelets: 25 10*3/uL — CL (ref 150–400)
RBC: 1.78 MIL/uL — ABNORMAL LOW (ref 4.22–5.81)
RBC: 2.01 MIL/uL — AB (ref 4.22–5.81)
RDW: 16 % — ABNORMAL HIGH (ref 11.5–15.5)
RDW: 19.4 % — ABNORMAL HIGH (ref 11.5–15.5)
WBC: 4.3 10*3/uL (ref 4.0–10.5)
WBC: 3.7 10*3/uL — ABNORMAL LOW (ref 4.0–10.5)

## 2014-01-20 LAB — PROCALCITONIN: Procalcitonin: 53.13 ng/mL

## 2014-01-20 MED ORDER — POTASSIUM CHLORIDE CRYS ER 20 MEQ PO TBCR
40.0000 meq | EXTENDED_RELEASE_TABLET | ORAL | Status: AC
  Administered 2014-01-20 (×2): 40 meq via ORAL
  Filled 2014-01-20 (×2): qty 2

## 2014-01-20 MED ORDER — CALCIUM CARBONATE-VITAMIN D 500-200 MG-UNIT PO TABS: 2.0000 | ORAL_TABLET | Freq: Two times a day (BID) | ORAL | Status: DC

## 2014-01-20 MED ORDER — CALCIUM CARBONATE-VITAMIN D 500-200 MG-UNIT PO TABS
1.0000 | ORAL_TABLET | Freq: Two times a day (BID) | ORAL | Status: DC
  Filled 2014-01-20 (×2): qty 1

## 2014-01-20 MED ORDER — ONDANSETRON HCL 4 MG/2ML IJ SOLN
4.0000 mg | Freq: Three times a day (TID) | INTRAMUSCULAR | Status: DC | PRN
Start: 2014-01-20 — End: 2014-01-22
  Administered 2014-01-20 – 2014-01-22 (×2): 4 mg via INTRAVENOUS
  Filled 2014-01-20 (×2): qty 2

## 2014-01-20 MED ORDER — SODIUM CHLORIDE 0.9 % IV SOLN
1.0000 g | Freq: Once | INTRAVENOUS | Status: AC
  Administered 2014-01-20: 1 g via INTRAVENOUS
  Filled 2014-01-20: qty 10

## 2014-01-20 NOTE — Progress Notes (Signed)
[facility] Progress Note Patient Name: LYNWOOD KUBISIAK DOB: [birth year] MRN: [id number]  Date of Service  01/20/2014   HPI/Events of Note  hypokaliemia and hypocalcemia   eICU Interventions  Potassium and calcium replaced   Intervention Category Intermediate Interventions: Electrolyte abnormality - evaluation and management  DETERDING,ELIZABETH 01/20/2014, 5:36 AM

## 2014-01-20 NOTE — Progress Notes (Addendum)
Patient ID: Thomas [name], male   DOB: [birth year], 33 y.o.   MRN: [id number] [facility] PROGRESS NOTE  Thomas [name] TDV:[id number] DOB: [birth year] DOA: 01/17/2014 PCP: Philis Fendt, MD  Brief narrative: 33 y.o. male with history of HIV on HAART, recently diagnosed Hodgkin's lymphoma in 07/2013 (initial presentation with anemia and diffuse lymphadenopathy), status post supraclavicular right lymph node biopsy done on 08/02/2013, systemic chemotherapy in the form of ABVD started on 10/03/2013, last cycle given 01/16/2014 who presented to [facility] ED 01/17/2014 with diarrhea, dizziness and weakness and associated fevers and chills. In ED, pt was hypotensive and remained hypotensive despite 8 L fluid resuscitation. He was admitted by PCCM as he required pressor support with Levophed for septic shock thought to be secondary to GI losses. [facility] assumed care starting 01/20/2014.   Assessment/Plan:  Principal Problem: Septic shock / Neutropenic fever   Patient was hypotensive on admission, has required 8 L IV fluids and persistently hypotensive, HR 113, RR 21, T max 102.3 F and WBC count as low as 2. His lactic acid was low on admission. Pt has required pressor support with Levophed on admission. Procalcitonin was 131 on admission. Source of infection was thought to be GI / diarrhea. He was initially on broad spectrum abx, vanco and zosyn until 01/19/2014. This was then changed to cipro and flagyl per ID recomendations on 01/19/2014.  Has received chemotherapy 01/16/2014. Please note that the broad spectrum abx coverage would cover for neutropenic fever management as well   C. Diff was negative. Blood cultures to date are negative. Urine culutre showed no growth.  Active Problems: Acute respiratory failure with hypoxia  Oxygen saturation was 73% on room air on admission.   Pt had CXR done 01/18/14 and initial study did not show acute cardiopulmonary process but repeat CXR did show new lung opacity right  more than left possibly asymmetric edema, acute lung injury, or aspiration pneumonitis. He was on vanco and zosyn on admission until 01/19/2014. CXR repeated 6/29/2015showed improving aeration / resolving edema or pneumonia  Tapering solu-cortef (has required stress dose steroids on admission)  Respiratory status remains stable, maintaining oxygen saturation 100% without  oxygen support HIV on HAART  Appreciate ID following   HAART: emtricitabine every other day, intelence 200 mg PO BID, isentress 400 mg PO BID Acute renal failure / mixed metabolic acidosis  Secondary to diarrhea, dehydration, septic shock  Creatinine trending down, 3.37 --> 2.59  Has required bicarb drip but this has now been stopped since bicarb is improving   Renal US showed mild right hydronephrosis. He has good urien output. GU may be consulted if UO starts to decrease and if renal function deteriorates.  Pancytopenia  Secondary to bone marrow suppression from Hodgkin's lymphoma and sequela of chemotherapy   WBC count 3.7, Hgb 7.0 and platelet count 25 (trended up from yesterday 13)  Please note patient has received 1 unit PRBC for Hgb of 6.3 Hypocalcemia  Patient was given one dose of calcium gluconate; supplementation with calcium BID   DVT prophylaxis: SCD's while pt in hospital; he has thrombocytopenia   Code Status: full code  Family Communication: plan of care discussed with the patient Disposition Plan: transfer out of SDU; order is in place; RN aware of transfer   Leisa Lenz, MD  [facility] Pager [phone]  If 7PM-7AM, please contact night-coverage [email or web address] Password [email or web address] 01/20/2014, 6:35 PM   LOS: 3 days   Consultants:  Under PCCM initially but [facility] assumed primary team starting  01/20/2014   Infectious disease   Procedures:  None   Antibiotics:  Zosyn 01/17/14 --> 6/29   Vancomycin 01/17/14 --> 6/29   Cipro 6/29 -->  Flagyl 6/29 -->  HPI/Subjective: No acute  overnight events.  Objective: Filed Vitals:   01/20/14 0800 01/20/14 1000 01/20/14 1200 01/20/14 1346  BP: 110/74 105/76 109/78 110/76  Pulse: 78 63 69 69  Temp: 98.4 F (36.9 C)  98.1 F (36.7 C) 97.8 F (36.6 C)  TempSrc: Oral  Oral Oral  Resp: 20 23 22 20   Height:    6\' 1"  (1.854 m)  Weight:      SpO2: 98% 100% 99% 99%    Intake/Output Summary (Last 24 hours) at 01/20/14 1835 Last data filed at 01/20/14 1533  Gross per 24 hour  Intake 3317.5 ml  Output   3500 ml  Net -182.5 ml    Exam:   General:  Pt is alert, follows commands appropriately, not in acute distress  Cardiovascular: Regular rate and rhythm, S1/S2, no murmurs  Respiratory: Clear to auscultation bilaterally, no wheezing  Abdomen: Soft, non tender, non distended, bowel sounds present  Extremities: No edema, pulses DP and PT palpable bilaterally  Neuro: Grossly nonfocal  Data Reviewed: Basic Metabolic Panel:  Recent Labs Lab 01/17/14 2345 01/18/14 0303 01/18/14 0520 01/18/14 1550 01/19/14 0500 01/20/14 0455  NA 129* 138 133* 136* 141 143  K 4.6 3.5* 4.0 4.5 3.0* 2.8*  CL 97 109 105 108 108 113*  CO2 12*  --  9* 11* 15* 18*  GLUCOSE 88 86 164* 106* 103* 91  BUN 61* 60* 58* 57* 50* 34*  CREATININE 5.12* 4.70* 4.36* 3.75* 3.37* 2.59*  CALCIUM 7.1*  --  6.4* 6.9* 6.8* 6.1*  MG  --   --  1.5  --  1.6  --   PHOS  --   --  4.0  --   --   --    Liver Function Tests:  Recent Labs Lab 01/16/14 0839 01/17/14 2345 01/19/14 0500  AST 15 121* 33  ALT 13 123* 63*  ALKPHOS 147 119* 76  BILITOT 0.22 0.5 0.4  PROT 7.4 6.7 5.3*  ALBUMIN 3.2* 2.8* 2.1*   No results found for this basename: LIPASE, AMYLASE,  in the last 168 hours No results found for this basename: AMMONIA,  in the last 168 hours CBC:  Recent Labs Lab 01/16/14 0838  01/17/14 2345 01/18/14 0303 01/18/14 0520 01/19/14 0500 01/19/14 2010 01/20/14 0455  WBC 3.8*  --  2.0*  --  4.8 4.3 6.1 3.7*  NEUTROABS 2.8  --  1.5*  --    --   --   --   --   HGB 10.8*  < > 8.5* 6.5* 8.4* 6.3* 7.2* 7.0*  HCT 32.6*  < > 24.0* 19.0* 23.5* 17.2* 19.5* 18.9*  MCV 107.3*  --  99.6  --  98.7 96.6 93.8 94.0  PLT 161  --  22*  --  27* 16* 13* 25*  < > = values in this interval not displayed. Cardiac Enzymes:  Recent Labs Lab 01/18/14 0514  TROPONINI <0.30   BNP: No components found with this basename: POCBNP,  CBG: No results found for this basename: GLUCAP,  in the last 168 hours  CULTURE, BLOOD (ROUTINE X 2)     Status: None   Collection Time    01/18/14 12:55 AM      Result Value Ref Range Status   Specimen Description BLOOD LEFT  ARM   Final   Value:        BLOOD CULTURE RECEIVED NO GROWTH TO DATE CULTURE WILL BE HELD FOR 5 DAYS BEFORE ISSUING A FINAL NEGATIVE REPORT     Performed at [facility]   Report Status PENDING   Incomplete  CULTURE, BLOOD (ROUTINE X 2)     Status: None   Collection Time    01/18/14  1:16 AM      Result Value Ref Range Status   Specimen Description BLOOD LEFT ANTECUBITAL   Final   Value:        BLOOD CULTURE RECEIVED NO GROWTH TO DATE CULTURE WILL BE HELD FOR 5 DAYS BEFORE ISSUING A FINAL NEGATIVE REPORT     Performed at [facility]   Report Status PENDING   Incomplete  URINE CULTURE     Status: None   Collection Time    01/18/14  3:42 AM      Result Value Ref Range Status   Specimen Description URINE, RANDOM   Final   Value: NO GROWTH     Performed at [facility]   Report Status 01/19/2014 FINAL   Final  MRSA PCR SCREENING     Status: None   Collection Time    01/18/14  7:11 AM      Result Value Ref Range Status   MRSA by PCR NEGATIVE  NEGATIVE Final  CLOSTRIDIUM DIFFICILE BY PCR     Status: None   Collection Time    01/18/14 11:24 AM      Result Value Ref Range Status   C difficile by pcr NEGATIVE  NEGATIVE Final   Comment: Performed at [facility]     Studies: US Renal 01/20/2014     IMPRESSION: 1. Mild right hydronephrosis. Documented  right ureteral jet/patency. 2. Bilateral nephrolithiasis. 3. Bilateral pleural effusions.     Dg Chest Port 1 View 01/19/2014   IMPRESSION: Improved aeration compatible with resolving pneumonia and/or edema.      Scheduled Meds: . ciprofloxacin  500 mg Oral BID  . emtricitabine  200 mg Oral Q48H  . etravirine  200 mg Oral BID WC  . feeding supplement  1 Container Oral TID BM  . hydrocortisone sod suc  50 mg Intravenous Q12H  . metroNIDAZOLE  500 mg Oral 3 times per day  . raltegravir  400 mg Oral BID

## 2014-01-20 NOTE — Progress Notes (Signed)
[place] for Infectious Disease  Day # 2 cipro and flagyl  Also with two days of zosyn, one dose of vancomycin  Subjective: Pt feeling better less diarrhea   Antibiotics:  Anti-infectives   Start     Dose/Rate Route Frequency Ordered Stop   01/20/14 2200  emtricitabine (EMTRIVA) capsule 200 mg  Status:  Discontinued     200 mg Oral every 72 hours 01/18/14 1714 01/19/14 0802   01/20/14 2200  tenofovir (VIREAD) tablet 300 mg  Status:  Discontinued     300 mg Oral every 72 hours 01/18/14 1714 01/19/14 0804   01/19/14 2200  vancomycin (VANCOCIN) IVPB 1000 mg/200 mL premix  Status:  Discontinued     1,000 mg 200 mL/hr over 60 Minutes Intravenous Every 48 hours 01/18/14 0357 01/19/14 0636   01/19/14 2200  emtricitabine (EMTRIVA) capsule 200 mg    Comments:  Dose adjusted for estimated CrCl 30-49 mL/min   200 mg Oral Every 48 hours 01/19/14 0803     01/19/14 2200  tenofovir (VIREAD) tablet 300 mg  Status:  Discontinued    Comments:  Dose adjusted for estimated CrCl 30-49 mL/min   300 mg Oral Every 48 hours 01/19/14 0805 01/19/14 1537   01/19/14 1700  raltegravir (ISENTRESS) tablet 400 mg     400 mg Oral 2 times daily 01/19/14 1537     01/19/14 1700  etravirine (INTELENCE) tablet 200 mg     200 mg Oral 2 times daily with meals 01/19/14 1537     01/19/14 1400  metroNIDAZOLE (FLAGYL) tablet 500 mg     500 mg Oral 3 times per day 01/19/14 1205     01/19/14 1215  ciprofloxacin (CIPRO) tablet 500 mg     500 mg Oral 2 times daily 01/19/14 1205     01/19/14 1200  piperacillin-tazobactam (ZOSYN) IVPB 3.375 g  Status:  Discontinued     3.375 g 12.5 mL/hr over 240 Minutes Intravenous 3 times per day 01/19/14 0636 01/19/14 1205   01/19/14 1000  vancomycin (VANCOCIN) IVPB 750 mg/150 ml premix  Status:  Discontinued     750 mg 150 mL/hr over 60 Minutes Intravenous Every 24 hours 01/19/14 0636 01/19/14 1205   01/18/14 2200  efavirenz-emtricitabine-tenofovir (ATRIPLA) [phone] MG per  tablet 1 tablet  Status:  Discontinued     1 tablet Oral Daily at bedtime 01/18/14 1532 01/18/14 1643   01/18/14 2200  efavirenz (SUSTIVA) capsule 600 mg  Status:  Discontinued     600 mg Oral Daily at bedtime 01/18/14 1714 01/18/14 1727   01/18/14 2200  efavirenz (SUSTIVA) tablet 600 mg  Status:  Discontinued     600 mg Oral Daily at bedtime 01/18/14 1727 01/19/14 1537   01/18/14 0600  piperacillin-tazobactam (ZOSYN) IVPB 2.25 g  Status:  Discontinued     2.25 g 100 mL/hr over 30 Minutes Intravenous 4 times per day 01/18/14 0357 01/19/14 0636   01/18/14 0400  piperacillin-tazobactam (ZOSYN) IVPB 3.375 g  Status:  Discontinued     3.375 g 100 mL/hr over 30 Minutes Intravenous  Once 01/18/14 0346 01/18/14 0351   01/18/14 0400  vancomycin (VANCOCIN) IVPB 1000 mg/200 mL premix  Status:  Discontinued     1,000 mg 200 mL/hr over 60 Minutes Intravenous  Once 01/18/14 0346 01/18/14 0351   01/18/14 0100  vancomycin (VANCOCIN) IVPB 1000 mg/200 mL premix     1,000 mg 200 mL/hr over 60 Minutes Intravenous  Once 01/18/14 0053 01/18/14 0248  01/18/14 0100  piperacillin-tazobactam (ZOSYN) IVPB 3.375 g     3.375 g 100 mL/hr over 30 Minutes Intravenous  Once 01/18/14 0053 01/18/14 0142      Medications: Scheduled Meds: . antiseptic oral rinse  15 mL Mouth Rinse q12n4p  . ciprofloxacin  500 mg Oral BID  . emtricitabine  200 mg Oral Q48H  . etravirine  200 mg Oral BID WC  . feeding supplement (RESOURCE BREEZE)  1 Container Oral TID BM  . hydrocortisone sod succinate (SOLU-CORTEF) inj  50 mg Intravenous Q12H  . metroNIDAZOLE  500 mg Oral 3 times per day  . raltegravir  400 mg Oral BID   Continuous Infusions: . sodium chloride 1,000 mL (01/20/14 0330)   PRN Meds:.sodium chloride, loperamide, ondansetron, sodium chloride, sodium chloride, sodium chloride    Objective: Weight change: 4 lb 3 oz (1.9 kg)  Intake/Output Summary (Last 24 hours) at 01/20/14 1843 Last data filed at 01/20/14 1533   Gross per 24 hour  Intake 3317.5 ml  Output   3500 ml  Net -182.5 ml   Blood pressure 110/76, pulse 69, temperature 97.8 F (36.6 C), temperature source Oral, resp. rate 20, height 6' 1"  (1.854 m), weight 151 lb 10.8 oz (68.8 kg), SpO2 99.00%. Temp:  [97.8 F (36.6 C)-98.6 F (37 C)] 97.8 F (36.6 C) (06/30 1346) Pulse Rate:  [63-96] 69 (06/30 1346) Resp:  [18-30] 20 (06/30 1346) BP: (98-112)/(60-87) 110/76 mmHg (06/30 1346) SpO2:  [98 %-100 %] 99 % (06/30 1346) Weight:  [151 lb 10.8 oz (68.8 kg)] 151 lb 10.8 oz (68.8 kg) (06/30 0500)  Physical Exam: General: Alert and awake, oriented x3, not in any acute distress  HEENT: anicteric sclera, pupils reactive to light and accommodation, EOMI,  G: soft nd, diffusely tender  Skin: no rashes  Neuro: nonfocal,   CBC:  Recent Labs Lab 01/17/14 2345 01/18/14 0303 01/18/14 0514 01/18/14 0520 01/19/14 0500 01/19/14 2010 01/20/14 0455  HGB 8.5* 6.5*  --  8.4* 6.3* 7.2* 7.0*  HCT 24.0* 19.0*  --  23.5* 17.2* 19.5* 18.9*  PLT 22*  --   --  27* 16* 13* 25*  INR  --   --  1.42  --   --   --   --   APTT  --   --  40*  --   --   --   --      BMET  Recent Labs  01/19/14 0500 01/20/14 0455  NA 141 143  K 3.0* 2.8*  CL 108 113*  CO2 15* 18*  GLUCOSE 103* 91  BUN 50* 34*  CREATININE 3.37* 2.59*  CALCIUM 6.8* 6.1*     Liver Panel   Recent Labs  01/17/14 2345 01/19/14 0500  PROT 6.7 5.3*  ALBUMIN 2.8* 2.1*  AST 121* 33  ALT 123* 63*  ALKPHOS 119* 76  BILITOT 0.5 0.4       Sedimentation Rate No results found for this basename: ESRSEDRATE,  in the last 72 hours C-Reactive Protein No results found for this basename: CRP,  in the last 72 hours  Micro Results: Recent Results (from the past 240 hour(s))  CULTURE, BLOOD (ROUTINE X 2)     Status: None   Collection Time    01/18/14 12:55 AM      Result Value Ref Range Status   Specimen Description BLOOD LEFT ARM   Final   Special Requests BOTTLES DRAWN AEROBIC  AND ANAEROBIC 5CC   Final   Culture  Setup Time     Final   Value: 01/18/2014 16:18     Performed at [facility]   Culture     Final   Value:        BLOOD CULTURE RECEIVED NO GROWTH TO DATE CULTURE WILL BE HELD FOR 5 DAYS BEFORE ISSUING A FINAL NEGATIVE REPORT     Performed at [facility]   Report Status PENDING   Incomplete  CULTURE, BLOOD (ROUTINE X 2)     Status: None   Collection Time    01/18/14  1:16 AM      Result Value Ref Range Status   Specimen Description BLOOD LEFT ANTECUBITAL   Final   Special Requests BOTTLES DRAWN AEROBIC AND ANAEROBIC 2CC   Final   Culture  Setup Time     Final   Value: 01/18/2014 16:19     Performed at [facility]   Culture     Final   Value:        BLOOD CULTURE RECEIVED NO GROWTH TO DATE CULTURE WILL BE HELD FOR 5 DAYS BEFORE ISSUING A FINAL NEGATIVE REPORT     Performed at [facility]   Report Status PENDING   Incomplete  URINE CULTURE     Status: None   Collection Time    01/18/14  3:42 AM      Result Value Ref Range Status   Specimen Description URINE, RANDOM   Final   Special Requests Immunocompromised   Final   Culture  Setup Time     Final   Value: 01/18/2014 16:29     Performed at [place]     Final   Value: NO GROWTH     Performed at [facility]   Culture     Final   Value: NO GROWTH     Performed at [facility]   Report Status 01/19/2014 FINAL   Final  MRSA PCR SCREENING     Status: None   Collection Time    01/18/14  7:11 AM      Result Value Ref Range Status   MRSA by PCR NEGATIVE  NEGATIVE Final   Comment:            The GeneXpert MRSA Assay (FDA     approved for NASAL specimens     only), is one component of a     comprehensive MRSA colonization     surveillance program. It is not     intended to diagnose MRSA     infection nor to guide or     monitor treatment for     MRSA infections.  CLOSTRIDIUM DIFFICILE BY PCR     Status: None    Collection Time    01/18/14 11:24 AM      Result Value Ref Range Status   C difficile by pcr NEGATIVE  NEGATIVE Final   Comment: Performed at [facility]    Studies/Results: US Renal  01/20/2014   CLINICAL DATA:  Acute kidney injury.  EXAM: RENAL/URINARY TRACT ULTRASOUND COMPLETE  COMPARISON:  PET-CT 12/10/2013  FINDINGS: Right Kidney:  Length: 12 cm. Echogenicity is within normal limits. Hydronephrosis, but decreased from comparison PET-CT. The cause is not identified. A right ureteral jet is present, consistent with at least partial ureteral patency. There are echogenic medullary structures consistent with stones, underestimated by ultrasound compared to previous CT. At least 2 stones are seen in the upper and  interpolar region, 6 to 7 mm individually.  Left Kidney:  Length: 13 cm. Echogenicity is within normal limits. No hydronephrosis. Stone burden is underestimated compared to preceding CT. An lower pole calculus is noted at 7 mm and interpolar stone is noted at 5 mm.  Bladder:  Appears normal for degree of bladder distention.  Bilateral pleural effusions are noted. There is splenomegaly (618 cc volume) which is known per previous PET-CT imaging.  IMPRESSION: 1. Mild right hydronephrosis. Documented right ureteral jet/patency. 2. Bilateral nephrolithiasis. 3. Bilateral pleural effusions.   Electronically Signed   By: Jorje Guild M.D.   On: 01/20/2014 04:35   Dg Chest Port 1 View  01/19/2014   CLINICAL DATA:  Pneumonia.  EXAM: PORTABLE CHEST - 1 VIEW  COMPARISON:  Single view of the chest 01/18/2014. CT chest, abdomen and pelvis 08/04/2013.  FINDINGS: The patient is rotated on the study. Bilateral alveolar and interstitial opacities seen on the comparison studies has improved. Heart size is normal. No pneumothorax or pleural effusion is identified.  IMPRESSION: Improved aeration compatible with resolving pneumonia and/or edema.   Electronically Signed   By: Inge Rise M.D.   On:  01/19/2014 07:42      Assessment/Plan:  Active Problems:   Septic shock    Thomas [name] is a 33 y.o. male with  HIV, Hodgkins lymphoma on ABVD chemotherpay admitted with severe diarrhea, hypotension, picture consistent with sepsis, pancytopenia  AKI,    #1 Severe diarrhea: seems to be improving on Ciprofloxacin and flagyl. CDI was negative.  --continue supportive care  --continue cipro and flagyl  --followup stool studies   #2 HIV: has always been highly compliant   Given the AKI, I changed  him to a more "kidney friendly regimen" and take him off TNF and change to   Isentress 441m BID  Intelence 2037mBID (could also be given as 4001maily)   And Emtricitabine every other day   CD4 is OK despite chemotherapy, sepsis  and pancytopenia    his AKI is recovering and if continues to improve hopefully can puth him back on a Single tablet regimen  IF He is hLA B701 this could potentially be TRIUMEQ which does not contain TNF vs rechallenge with TNF containing regimen when kidneys recovered  #3 AKI: likely due to his severe diarrhea and dehydration  --continue supportive care and willl changed off of TNF  #4 Pancytopenia: Platletes stilll profoundlylow this am, sp tranfusion  Dr. ComLinus Salmons pick up the service tomorrow.   LOS: 3 days   CorAlcide Evener30/2015, 6:43 PM

## 2014-01-20 NOTE — Progress Notes (Signed)
Attempted to call to give report to RN on [facility]. RN unavailable at this time. Contact info forwarded.

## 2014-01-20 NOTE — Progress Notes (Signed)
CRITICAL VALUE ALERT  Critical value received:  K  2.8, Ca 6.1  Date of notification: 01/20/14   Time of notification:  0530  Critical value read back:Yes.    Nurse who received alert:  Jari Favre RN  MD notified (1st page):  E-Link  Time of first page:  0533  MD notified (2nd page):  Time of second page:  Responding MD:  Dr Jimmy Footman  Time MD responded:  [phone]

## 2014-01-20 NOTE — ED Provider Notes (Signed)
See separate note  Medical screening examination/treatment/procedure(s) were conducted as a shared visit with non-physician practitioner(s) or resident  and myself.  I personally evaluated the patient during the encounter and agree with the findings and plan unless otherwise indicated.    I have personally reviewed any xrays and/ or EKG's with the provider and I agree with interpretation.     Mariea Clonts, MD 01/20/14 [phone]

## 2014-01-21 DIAGNOSIS — E876 Hypokalemia: Secondary | ICD-10-CM

## 2014-01-21 DIAGNOSIS — R579 Shock, unspecified: Secondary | ICD-10-CM

## 2014-01-21 DIAGNOSIS — E869 Volume depletion, unspecified: Secondary | ICD-10-CM

## 2014-01-21 LAB — MAGNESIUM: Magnesium: 1 mg/dL — ABNORMAL LOW (ref 1.5–2.5)

## 2014-01-21 MED ORDER — CALCIUM CARBONATE ANTACID 500 MG PO CHEW
800.0000 mg | CHEWABLE_TABLET | Freq: Three times a day (TID) | ORAL | Status: DC
  Administered 2014-01-21 – 2014-01-22 (×4): 800 mg via ORAL
  Filled 2014-01-21 (×6): qty 4

## 2014-01-21 MED ORDER — HYDROCORTISONE NA SUCCINATE PF 100 MG IJ SOLR
50.0000 mg | Freq: Every day | INTRAMUSCULAR | Status: DC
  Administered 2014-01-22: 50 mg via INTRAVENOUS
  Filled 2014-01-21: qty 1

## 2014-01-21 MED ORDER — SODIUM CHLORIDE 0.9 % IV SOLN
INTRAVENOUS | Status: DC
  Administered 2014-01-21: 100 mL/h via INTRAVENOUS
  Administered 2014-01-21 – 2014-01-22 (×3): via INTRAVENOUS

## 2014-01-21 MED ORDER — POTASSIUM CHLORIDE 10 MEQ/100ML IV SOLN
10.0000 meq | INTRAVENOUS | Status: DC
  Administered 2014-01-21: 10 meq via INTRAVENOUS
  Filled 2014-01-21 (×4): qty 100

## 2014-01-21 MED ORDER — POTASSIUM CHLORIDE CRYS ER 20 MEQ PO TBCR
40.0000 meq | EXTENDED_RELEASE_TABLET | Freq: Every day | ORAL | Status: DC
  Administered 2014-01-21 – 2014-01-22 (×2): 40 meq via ORAL
  Filled 2014-01-21 (×2): qty 2

## 2014-01-21 NOTE — Progress Notes (Signed)
Patient ID: Thomas [name], male   DOB: 23-Dec-1980, 33 y.o.   MRN: [id number] [facility] PROGRESS NOTE  Thomas [name] [id number] DOB: 09/22/1980 DOA: 01/17/2014 PCP: Philis Fendt, MD  Brief narrative: 33 y.o. male with history of HIV on HAART, recently diagnosed Hodgkin's lymphoma in 07/2013 (initial presentation with anemia and diffuse lymphadenopathy), status post supraclavicular right lymph node biopsy done on 08/02/2013, systemic chemotherapy in the form of ABVD started on 10/03/2013, last cycle given 01/16/2014 who presented to [facility] ED 01/17/2014 with diarrhea, dizziness and weakness and associated fevers and chills. In ED, pt was hypotensive and remained hypotensive despite 8 L fluid resuscitation. He was admitted by PCCM as he required pressor support with Levophed for septic shock thought to be secondary to GI losses. [facility] assumed care starting 01/20/2014.   Assessment/Plan:  Principal Problem: Septic shock / Neutropenic fever   Patient was hypotensive on admission, has required 8 L IV fluids and persistently hypotensive, HR 113, RR 21, T max 102.3 F and WBC count as low as 2. His lactic acid was low on admission. Pt has required pressor support with Levophed on admission. Procalcitonin was 131 on admission. Source of infection was thought to be GI / diarrhea. He was initially on broad spectrum abx, vanco and zosyn until 01/19/2014. This was then changed to cipro and flagyl per ID recomendations on 01/19/2014.  Has received chemotherapy 01/16/2014. Please note that the broad spectrum abx coverage would cover for neutropenic fever management as well   C. Diff was negative. Blood cultures to date are negative. Urine culutre showed no growth.  Clinically improving  Active Problems: Acute respiratory failure with hypoxia  Oxygen saturation was 73% on room air on admission.   Pt had CXR done 01/18/14 and initial study did not show acute cardiopulmonary process but repeat CXR did  show new lung opacity right more than left possibly asymmetric edema, acute lung injury, or aspiration pneumonitis. He was on vanco and zosyn on admission until 01/19/2014. CXR repeated 6/29/2015showed improving aeration / resolving edema or pneumonia  Tapering solu-cortef (has required stress dose steroids on admission)  Respiratory status remains stable, maintaining oxygen saturation 100% without [place] oxygen support HIV on HAART  Appreciate ID following   HAART: emtricitabine every other day, intelence 200 mg PO BID, isentress 400 mg PO BID Acute renal failure / mixed metabolic acidosis  Secondary to diarrhea, dehydration, septic shock  Creatinine trending down, 3.37 --> 2.59  Has required bicarb drip but this has now been stopped since bicarb is improving   Renal US showed mild right hydronephrosis. He has good urien output.   Pancytopenia  Secondary to bone marrow suppression from Hodgkin's lymphoma and sequela of chemotherapy   WBC count 3.7, Hgb 7.0 and platelet count 25 (trended up from yesterday 13)  Please note patient has received 1 unit PRBC for Hgb of 6.3 Hypokalemia Likely secondary to GI loses from Diarrhea Replacing Hypocalcemia  Likely related to poor nutritional status, hypoalbuminemia, high volume fluid resuscitation   Calcium Carbonate TID with meals  DVT prophylaxis: SCD's while pt in hospital; he has thrombocytopenia   Code Status: full code  Family Communication: plan of care discussed with the patient Disposition Plan: Continue supportive care  Kelvin Cellar, MD  [facility] Pager [phone]  If 7PM-7AM, please contact night-coverage [email or web address] Password [email or web address] 01/21/2014, 11:00 AM   LOS: 4 days   Consultants:  Under PCCM initially but [facility] assumed primary team starting 01/20/2014   Infectious disease  Procedures:  None   Antibiotics:  Zosyn 01/17/14 --> 6/29   Vancomycin 01/17/14 --> 6/29   Cipro 6/29 -->  Flagyl 6/29  -->  HPI/Subjective: Patient reports feeling better today, tolerating PO intake  Objective: Filed Vitals:   01/20/14 1200 01/20/14 1346 01/20/14 2215 01/21/14 0443  BP: 109/78 110/76 116/81 110/75  Pulse: 69 69 59 64  Temp: 98.1 F (36.7 C) 97.8 F (36.6 C) 97.9 F (36.6 C) 97.6 F (36.4 C)  TempSrc: Oral Oral Oral Oral  Resp: 22 20 24 20   Height:  6\' 1"  (1.854 m)    Weight:    70.262 kg (154 lb 14.4 oz)  SpO2: 99% 99% 100% 100%    Intake/Output Summary (Last 24 hours) at 01/21/14 1100 Last data filed at 01/21/14 0817  Gross per 24 hour  Intake   2865 ml  Output   3575 ml  Net   -710 ml    Exam:   General:  Pt is alert, follows commands appropriately, not in acute distress, nontoxic appearing  Cardiovascular: Regular rate and rhythm, S1/S2, no murmurs  Respiratory: Clear to auscultation bilaterally, no wheezing  Abdomen: Soft, non tender, non distended, bowel sounds present  Extremities: No edema, pulses DP and PT palpable bilaterally  Neuro: Grossly nonfocal  Data Reviewed: Basic Metabolic Panel:  Recent Labs Lab 01/17/14 2345 01/18/14 0303 01/18/14 0520 01/18/14 1550 01/19/14 0500 01/20/14 0455  NA 129* 138 133* 136* 141 143  K 4.6 3.5* 4.0 4.5 3.0* 2.8*  CL 97 109 105 108 108 113*  CO2 12*  --  9* 11* 15* 18*  GLUCOSE 88 86 164* 106* 103* 91  BUN 61* 60* 58* 57* 50* 34*  CREATININE 5.12* 4.70* 4.36* 3.75* 3.37* 2.59*  CALCIUM 7.1*  --  6.4* 6.9* 6.8* 6.1*  MG  --   --  1.5  --  1.6  --   PHOS  --   --  4.0  --   --   --    Liver Function Tests:  Recent Labs Lab 01/16/14 0839 01/17/14 2345 01/19/14 0500  AST 15 121* 33  ALT 13 123* 63*  ALKPHOS 147 119* 76  BILITOT 0.22 0.5 0.4  PROT 7.4 6.7 5.3*  ALBUMIN 3.2* 2.8* 2.1*   No results found for this basename: LIPASE, AMYLASE,  in the last 168 hours No results found for this basename: AMMONIA,  in the last 168 hours CBC:  Recent Labs Lab 01/16/14 0838  01/17/14 2345 01/18/14 0303  01/18/14 0520 01/19/14 0500 01/19/14 2010 01/20/14 0455  WBC 3.8*  --  2.0*  --  4.8 4.3 6.1 3.7*  NEUTROABS 2.8  --  1.5*  --   --   --   --   --   HGB 10.8*  < > 8.5* 6.5* 8.4* 6.3* 7.2* 7.0*  HCT 32.6*  < > 24.0* 19.0* 23.5* 17.2* 19.5* 18.9*  MCV 107.3*  --  99.6  --  98.7 96.6 93.8 94.0  PLT 161  --  22*  --  27* 16* 13* 25*  < > = values in this interval not displayed. Cardiac Enzymes:  Recent Labs Lab 01/18/14 0514  TROPONINI <0.30   BNP: No components found with this basename: POCBNP,  CBG: No results found for this basename: GLUCAP,  in the last 168 hours  CULTURE, BLOOD (ROUTINE X 2)     Status: None   Collection Time    01/18/14 12:55 AM  Result Value Ref Range Status   Specimen Description BLOOD LEFT ARM   Final   Value:        BLOOD CULTURE RECEIVED NO GROWTH TO DATE CULTURE WILL BE HELD FOR 5 DAYS BEFORE ISSUING A FINAL NEGATIVE REPORT     Performed at [facility]   Report Status PENDING   Incomplete  CULTURE, BLOOD (ROUTINE X 2)     Status: None   Collection Time    01/18/14  1:16 AM      Result Value Ref Range Status   Specimen Description BLOOD LEFT ANTECUBITAL   Final   Value:        BLOOD CULTURE RECEIVED NO GROWTH TO DATE CULTURE WILL BE HELD FOR 5 DAYS BEFORE ISSUING A FINAL NEGATIVE REPORT     Performed at [facility]   Report Status PENDING   Incomplete  URINE CULTURE     Status: None   Collection Time    01/18/14  3:42 AM      Result Value Ref Range Status   Specimen Description URINE, RANDOM   Final   Value: NO GROWTH     Performed at [facility]   Report Status 01/19/2014 FINAL   Final  MRSA PCR SCREENING     Status: None   Collection Time    01/18/14  7:11 AM      Result Value Ref Range Status   MRSA by PCR NEGATIVE  NEGATIVE Final  CLOSTRIDIUM DIFFICILE BY PCR     Status: None   Collection Time    01/18/14 11:24 AM      Result Value Ref Range Status   C difficile by pcr NEGATIVE  NEGATIVE Final    Comment: Performed at [facility]     Studies: US Renal 01/20/2014     IMPRESSION: 1. Mild right hydronephrosis. Documented right ureteral jet/patency. 2. Bilateral nephrolithiasis. 3. Bilateral pleural effusions.     Dg Chest Port 1 View 01/19/2014   IMPRESSION: Improved aeration compatible with resolving pneumonia and/or edema.      Scheduled Meds: . ciprofloxacin  500 mg Oral BID  . emtricitabine  200 mg Oral Q48H  . etravirine  200 mg Oral BID WC  . feeding supplement  1 Container Oral TID BM  . hydrocortisone sod suc  50 mg Intravenous Q12H  . metroNIDAZOLE  500 mg Oral 3 times per day  . raltegravir  400 mg Oral BID

## 2014-01-21 NOTE — Progress Notes (Signed)
[place] [facility]  Date of Admission:  01/17/2014  Antibiotics: cipro flagyl  Subjective: Feels much better, only 3 loose stools today  Objective: Temp:  [97.6 F (36.4 C)-97.9 F (36.6 C)] 97.9 F (36.6 C) (07/01 1330) Pulse Rate:  [59-72] 72 (07/01 1330) Resp:  [18-24] 18 (07/01 1330) BP: (109-116)/(72-81) 109/72 mmHg (07/01 1330) SpO2:  [100 %] 100 % (07/01 1330) Weight:  [154 lb 14.4 oz (70.262 kg)] 154 lb 14.4 oz (70.262 kg) (07/01 0443)  General: awake, alert, nad Skin: no rashes Lungs: CTA B Cor: RRR without m Abdomen: soft, nt, nd, minimal hyperactive bowel sounds Ext: no edema  Lab Results Lab Results  Component Value Date   WBC 3.7* 01/20/2014   HGB 7.0* 01/20/2014   HCT 18.9* 01/20/2014   MCV 94.0 01/20/2014   PLT 25* 01/20/2014    Lab Results  Component Value Date   CREATININE 2.59* 01/20/2014   BUN 34* 01/20/2014   NA 143 01/20/2014   K 2.8* 01/20/2014   CL 113* 01/20/2014   CO2 18* 01/20/2014    Lab Results  Component Value Date   ALT 63* 01/19/2014   AST 33 01/19/2014   ALKPHOS 76 01/19/2014   BILITOT 0.4 01/19/2014      Microbiology: Recent Results (from the past 240 hour(s))  CULTURE, BLOOD (ROUTINE X 2)     Status: None   Collection Time    01/18/14 12:55 AM      Result Value Ref Range Status   Specimen Description BLOOD LEFT ARM   Final   Special Requests BOTTLES DRAWN AEROBIC AND ANAEROBIC 5CC   Final   Culture  Setup Time     Final   Value: 01/18/2014 16:18     Performed at [facility]   Culture     Final   Value:        BLOOD CULTURE RECEIVED NO GROWTH TO DATE CULTURE WILL BE HELD FOR 5 DAYS BEFORE ISSUING A FINAL NEGATIVE REPORT     Performed at [facility]   Report Status PENDING   Incomplete  CULTURE, BLOOD (ROUTINE X 2)     Status: None   Collection Time    01/18/14  1:16 AM      Result Value Ref Range Status   Specimen Description BLOOD LEFT ANTECUBITAL   Final   Special Requests BOTTLES  DRAWN AEROBIC AND ANAEROBIC 2CC   Final   Culture  Setup Time     Final   Value: 01/18/2014 16:19     Performed at [facility]   Culture     Final   Value:        BLOOD CULTURE RECEIVED NO GROWTH TO DATE CULTURE WILL BE HELD FOR 5 DAYS BEFORE ISSUING A FINAL NEGATIVE REPORT     Performed at [facility]   Report Status PENDING   Incomplete  URINE CULTURE     Status: None   Collection Time    01/18/14  3:42 AM      Result Value Ref Range Status   Specimen Description URINE, RANDOM   Final   Special Requests Immunocompromised   Final   Culture  Setup Time     Final   Value: 01/18/2014 16:29     Performed at [place]     Final   Value: NO GROWTH     Performed at [facility]  Final   Value: NO GROWTH     Performed at [facility]   Report Status 01/19/2014 FINAL   Final  MRSA PCR SCREENING     Status: None   Collection Time    01/18/14  7:11 AM      Result Value Ref Range Status   MRSA by PCR NEGATIVE  NEGATIVE Final   Comment:            The GeneXpert MRSA Assay (FDA     approved for NASAL specimens     only), is one component of a     comprehensive MRSA colonization     surveillance program. It is not     intended to diagnose MRSA     infection nor to guide or     monitor treatment for     MRSA infections.  CLOSTRIDIUM DIFFICILE BY PCR     Status: None   Collection Time    01/18/14 11:24 AM      Result Value Ref Range Status   C difficile by pcr NEGATIVE  NEGATIVE Final   Comment: Performed at [facility]    Studies/Results: US Renal  01/20/2014   CLINICAL DATA:  Acute kidney injury.  EXAM: RENAL/URINARY TRACT ULTRASOUND COMPLETE  COMPARISON:  PET-CT 12/10/2013  FINDINGS: Right Kidney:  Length: 12 cm. Echogenicity is within normal limits. Hydronephrosis, but decreased from comparison PET-CT. The cause is not identified. A right ureteral jet is present, consistent with at least partial  ureteral patency. There are echogenic medullary structures consistent with stones, underestimated by ultrasound compared to previous CT. At least 2 stones are seen in the upper and interpolar region, 6 to 7 mm individually.  Left Kidney:  Length: 13 cm. Echogenicity is within normal limits. No hydronephrosis. Stone burden is underestimated compared to preceding CT. An lower pole calculus is noted at 7 mm and interpolar stone is noted at 5 mm.  Bladder:  Appears normal for degree of bladder distention.  Bilateral pleural effusions are noted. There is splenomegaly (618 cc volume) which is known per previous PET-CT imaging.  IMPRESSION: 1. Mild right hydronephrosis. Documented right ureteral jet/patency. 2. Bilateral nephrolithiasis. 3. Bilateral pleural effusions.   Electronically Signed   By: Jorje Guild M.D.   On: 01/20/2014 04:35    Assessment/Plan: 1)  Diarrhea with shock, volume depletion - much improved.  Able to keep hydrated on his own now with minimal loses.   -continue with cipro and flagyl through 7/5.    If ok in am, ok from ID standpoint for dc, he has follow up with me in August already.    Thanks  Scharlene Gloss, [place] [facility] [place] [email or web address] [phone] pager   [phone] cell 01/21/2014, 3:42 PM

## 2014-01-22 DIAGNOSIS — K529 Noninfective gastroenteritis and colitis, unspecified: Secondary | ICD-10-CM | POA: Diagnosis present

## 2014-01-22 DIAGNOSIS — E876 Hypokalemia: Secondary | ICD-10-CM | POA: Diagnosis present

## 2014-01-22 HISTORY — DX: Noninfective gastroenteritis and colitis, unspecified: K52.9

## 2014-01-22 LAB — TYPE AND SCREEN
ABO/RH(D): O POS
Antibody Screen: NEGATIVE
Unit division: 0
Unit division: 0

## 2014-01-22 LAB — CBC
HEMATOCRIT: 21.3 % — AB (ref 39.0–52.0)
Hemoglobin: 7.4 g/dL — ABNORMAL LOW (ref 13.0–17.0)
MCH: 33.5 pg (ref 26.0–34.0)
MCHC: 34.7 g/dL (ref 30.0–36.0)
MCV: 96.4 fL (ref 78.0–100.0)
Platelets: 41 10*3/uL — ABNORMAL LOW (ref 150–400)
RBC: 2.21 MIL/uL — ABNORMAL LOW (ref 4.22–5.81)
RDW: 18.4 % — AB (ref 11.5–15.5)
WBC: 2 10*3/uL — AB (ref 4.0–10.5)

## 2014-01-22 LAB — COMPREHENSIVE METABOLIC PANEL
ALK PHOS: 76 U/L (ref 39–117)
ALT: 24 U/L (ref 0–53)
AST: 13 U/L (ref 0–37)
Albumin: 2.2 g/dL — ABNORMAL LOW (ref 3.5–5.2)
Anion gap: 11 (ref 5–15)
BUN: 17 mg/dL (ref 6–23)
CO2: 17 mEq/L — ABNORMAL LOW (ref 19–32)
Calcium: 6 mg/dL — CL (ref 8.4–10.5)
Chloride: 113 mEq/L — ABNORMAL HIGH (ref 96–112)
Creatinine, Ser: 1.98 mg/dL — ABNORMAL HIGH (ref 0.50–1.35)
GFR calc Af Amer: 51 mL/min — ABNORMAL LOW (ref >90)
GFR calc non Af Amer: 44 mL/min — ABNORMAL LOW (ref >90)
GLUCOSE: 87 mg/dL (ref 70–99)
POTASSIUM: 2.5 meq/L — AB (ref 3.7–5.3)
SODIUM: 141 meq/L (ref 137–147)
Total Bilirubin: 0.3 mg/dL (ref 0.3–1.2)
Total Protein: 5.2 g/dL — ABNORMAL LOW (ref 6.0–8.3)

## 2014-01-22 LAB — POTASSIUM: Potassium: 3.5 mEq/L — ABNORMAL LOW (ref 3.7–5.3)

## 2014-01-22 MED ORDER — ENSURE COMPLETE PO LIQD: 237.0000 mL | Freq: Two times a day (BID) | ORAL | Status: DC

## 2014-01-22 MED ORDER — METRONIDAZOLE 500 MG PO TABS: 500.0000 mg | ORAL_TABLET | Freq: Three times a day (TID) | ORAL | Status: DC

## 2014-01-22 MED ORDER — POTASSIUM CHLORIDE CRYS ER 20 MEQ PO TBCR: 20.0000 meq | EXTENDED_RELEASE_TABLET | Freq: Every day | ORAL | Status: DC

## 2014-01-22 MED ORDER — POTASSIUM CHLORIDE CRYS ER 20 MEQ PO TBCR
40.0000 meq | EXTENDED_RELEASE_TABLET | Freq: Four times a day (QID) | ORAL | Status: AC
  Administered 2014-01-22 (×2): 40 meq via ORAL
  Filled 2014-01-22 (×3): qty 2

## 2014-01-22 MED ORDER — CALCIUM CARBONATE ANTACID 500 MG PO CHEW: 800.0000 mg | CHEWABLE_TABLET | Freq: Three times a day (TID) | ORAL | Status: DC

## 2014-01-22 MED ORDER — MAGNESIUM SULFATE 40 MG/ML IJ SOLN
2.0000 g | Freq: Once | INTRAMUSCULAR | Status: AC
Start: 2014-01-22 — End: 2014-01-22
  Administered 2014-01-22: 2 g via INTRAVENOUS
  Filled 2014-01-22: qty 50

## 2014-01-22 MED ORDER — POTASSIUM CHLORIDE 10 MEQ/100ML IV SOLN
10.0000 meq | INTRAVENOUS | Status: AC
  Administered 2014-01-22 (×4): 10 meq via INTRAVENOUS
  Filled 2014-01-22 (×4): qty 100

## 2014-01-22 MED ORDER — HEPARIN SOD (PORK) LOCK FLUSH 100 UNIT/ML IV SOLN
500.0000 [IU] | INTRAVENOUS | Status: AC | PRN
  Administered 2014-01-22: 500 [IU]

## 2014-01-22 MED ORDER — CIPROFLOXACIN HCL 500 MG PO TABS: 500.0000 mg | ORAL_TABLET | Freq: Two times a day (BID) | ORAL | Status: DC

## 2014-01-22 NOTE — Progress Notes (Signed)
NUTRITION FOLLOW UP  Intervention:   -Modify Resource Breeze to Ensure Complete BID -Will continue to monitor  Nutrition Dx:   Increased nutrient needs related to HIV with lymphoma as evidenced by MD notes.    Goal:   Advance diet as tolerated to regular diet - met Resolution of diarrhea- progessing   Monitor:   Total protein/energy intake, labs, weights, GI profile  Assessment:   6/29: - Pt has been seen by outpatient [facility]  - Reports not eating anything since Friday PTA but had some broth today  - Prior to Friday he was eating 2-3 meals/day and drinking 3 Ensure/day  - Weight up 14 pounds in the past month  - Denies any nausea/vomiting today and reports having 1-2 episodes of diarrhea today, improved from 10 times/day over the weekend  - Pt under the covers and falling asleep at end of visit, did not perform nutrition focused physical exam  Potassium low, getting oral replacement  ALT elevated  7/02: -Diet advanced to Regular on 6/29 -Pt tolerating solid foods w/out nausea/abd pain. PO intake 75%, has been skipping breakfast as pt is not hungry -Receiving Resource Breeze TID; however pt request to modify to Ensure Complete as he prefers that supplement and is now able to tolerate. -Loose stools improving. Pt C.diff negative. -Low K and Ca are being repleted; likely r/t hx of poor PO intake and loose stools -Current weight likely not accurate as pt's weight averages 133 lbs per previous medical records  Height: Ht Readings from Last 1 Encounters:  01/20/14 6' 1"  (1.854 m)    Weight Status:   Wt Readings from Last 1 Encounters:  01/22/14 419 lb 3.2 oz (190.148 kg)    Re-estimated needs:  Kcal: 2100-2300  Protein: 100-120g  Fluid: 2.1-2.3L/day    Skin: Intact  Diet Order: General   Intake/Output Summary (Last 24 hours) at 01/22/14 1536 Last data filed at 01/22/14 1413  Gross per 24 hour  Intake   1020 ml  Output   4600 ml  Net   -3580 ml    Last BM: 6/30   Labs:   Recent Labs Lab 01/18/14 0303 01/18/14 0520  01/19/14 0500 01/20/14 0455 01/21/14 1030 01/22/14 0430 01/22/14 1325  NA 138 133*  < > 141 143  --  141  --   K 3.5* 4.0  < > 3.0* 2.8*  --  2.5* 3.5*  CL 109 105  < > 108 113*  --  113*  --   CO2  --  9*  < > 15* 18*  --  17*  --   BUN 60* 58*  < > 50* 34*  --  17  --   CREATININE 4.70* 4.36*  < > 3.37* 2.59*  --  1.98*  --   CALCIUM  --  6.4*  < > 6.8* 6.1*  --  6.0*  --   MG  --  1.5  --  1.6  --  1.0*  --   --   PHOS  --  4.0  --   --   --   --   --   --   GLUCOSE 86 164*  < > 103* 91  --  87  --   < > = values in this interval not displayed.  CBG (last 3)  No results found for this basename: GLUCAP,  in the last 72 hours  Scheduled Meds: . antiseptic oral rinse  15 mL Mouth Rinse  [id number]  . calcium carbonate  800 mg of elemental calcium Oral TID  . ciprofloxacin  500 mg Oral BID  . emtricitabine  200 mg Oral Q48H  . etravirine  200 mg Oral BID WC  . feeding supplement (ENSURE COMPLETE)  237 mL Oral BID BM  . hydrocortisone sod succinate (SOLU-CORTEF) inj  50 mg Intravenous Daily  . metroNIDAZOLE  500 mg Oral 3 times per day  . potassium chloride  40 mEq Oral Daily  . raltegravir  400 mg Oral BID    Continuous Infusions: . sodium chloride 100 mL/hr at 01/22/14 [place] LDN Clinical Dietitian [phone]

## 2014-01-22 NOTE — Progress Notes (Signed)
CCl has improved so he can go back on Atripla at home.  We will follow up with him next week and repeat a BMP to assure it is ok.   COMER, ROBERT

## 2014-01-22 NOTE — Progress Notes (Signed)
Pt verbalized understanding all  discharge instructions prior to discharge. Left Neck post IJ removal with dressing intact no bleeding or other problems noted with site. Pt reminded to leave dressing on for 24 hrs understanding verbalized. Discharged to home via wheelchair.

## 2014-01-22 NOTE — Discharge Summary (Signed)
Physician Discharge Summary  DELLIS VOGHT Thomas [name]:[id number] DOB: 1981/06/28 DOA: 01/17/2014  PCP: Philis Fendt, MD  Admit date: 01/17/2014 Discharge date: 01/22/2014  Time spent: 35 minutes  Recommendations for Outpatient Follow-up:  1. Please obtain a BMP in 1 week, he was treated for hypokalemia during this hospitalization, having potassium level of 3.5 prior to discharge. He was also found to have AKI, having a creatinine of 1.9 on the day of discharge, improved from 4.7.   Discharge Diagnoses:  Principal Problem:   Septic shock Active Problems:   Enteritis   HIV disease   Protein-calorie malnutrition, severe   Hodgkin's disease   Hypokalemia   Discharge Condition: Stable/Improved  Diet recommendation: Regular diet with protein boost  Filed Weights   01/20/14 0500 01/21/14 0443 01/22/14 0430  Weight: 68.8 kg (151 lb 10.8 oz) 70.262 kg (154 lb 14.4 oz) 190.148 kg (419 lb 3.2 oz)    History of present illness:  33 years old male with PMH relevant for HIV on antiretroviral therapy (ATRIPLA) and Hodgkin's lymphoma IIIA diagnosed in January 2015 and on chemotherapy (ABVD) with last cycle given 01/16/14. Presents with severe diarrhea, dizziness and weakness. He also had chills and questionable fever. These symptoms started on Friday 6/26 after his chemotherapy. Today he presents to the ED at [facility] with worsening symptoms. Found to have a fever of 101 and hypotensive with poor response to 8 L IVF's. Apart from the diarrhea there are no other obvious sources of infection. His lactate is normal but his creatinine increased from 1.6 on 6/26 to 5.12. His WBC is 2.0. At the time of my examination the patient is awake, alert with MAP of 65 on Levophed.    Hospital Course:  Patient is a 33 y.o. male with history of HIV on HAART, recently diagnosed Hodgkin's lymphoma in 07/2013 (initial presentation with anemia and diffuse lymphadenopathy), status post supraclavicular right lymph node biopsy done  on 08/02/2013, systemic chemotherapy in the form of ABVD started on 10/03/2013, last cycle given 01/16/2014 who presented to [facility] ED 01/17/2014 with complaints of diarrhea, dizziness and weakness and associated fevers and chills. In ED, pt was hypotensive and remained hypotensive despite 8 L fluid resuscitation. He was admitted by PCCM as he required pressor support with Levophed for septic shock thought to be secondary to GI losses. [facility] assumed care starting 01/20/2014. During this hospitalization he was seen by Dr Linus Salmons and Dr Tommy Medal of infectious disease. Diarrhea improved with emperic antibiotic therapy with Cipro and Flagyl. Stool for C diff was negative. He showed gradual clinical improvement with a decrease in his diarrhea. Kidney function improved as well with creatinine trending down to 1.98 on 01/22/2014 from 4.70 on 01/18/2014. During this hospitalization he was found to have hypokalemia and hypomagnesemia, likely related to his diarrheal illness. His potassium improving to 3.5 on the day of discharge. He was tolerating PO intake, afebrile, with diarrhea improved. ID cleared him for discharge on 01/22/2014 and will follow him up at the office. He was discharged in stable condition on 01/22/2014.    Consultations:  PCCM  Infectious Disease  Discharge Exam: Filed Vitals:   01/22/14 1411  BP: 118/87  Pulse: 67  Temp: 97.6 F (36.4 C)  Resp: 18    General: Pt is alert, follows commands appropriately, not in acute distress, nontoxic appearing  Cardiovascular: Regular rate and rhythm, S1/S2, no murmurs  Respiratory: Clear to auscultation bilaterally, no wheezing  Abdomen: Soft, non tender, non distended, bowel sounds present  Extremities: No edema, pulses DP and PT palpable bilaterally  Neuro: Grossly nonfocal   Discharge Instructions You were cared for by a hospitalist during your hospital stay. If you have any questions about your discharge medications or the care you received while you were  in the hospital after you are discharged, you can call the unit and asked to speak with the hospitalist on call if the hospitalist that took care of you is not available. Once you are discharged, your primary care physician will handle any further medical issues. Please note that NO REFILLS for any discharge medications will be authorized once you are discharged, as it is imperative that you return to your primary care physician (or establish a relationship with a primary care physician if you do not have one) for your aftercare needs so that they can reassess your need for medications and monitor your lab values.      Discharge Instructions   Call MD for:  difficulty breathing, headache or visual disturbances    Complete by:  As directed      Call MD for:  extreme fatigue    Complete by:  As directed      Call MD for:  persistant dizziness or light-headedness    Complete by:  As directed      Call MD for:  persistant nausea and vomiting    Complete by:  As directed      Call MD for:  severe uncontrolled pain    Complete by:  As directed      Call MD for:  temperature >100.4    Complete by:  As directed      Diet - low sodium heart healthy    Complete by:  As directed      Increase activity slowly    Complete by:  As directed             Medication List    STOP taking these medications       atenolol 25 MG tablet  Commonly known as:  TENORMIN      TAKE these medications       calcium carbonate 500 MG chewable tablet  Commonly known as:  TUMS - dosed in mg elemental calcium  Chew 4 tablets (800 mg of elemental calcium total) by mouth 3 (three) times daily.     ciprofloxacin 500 MG tablet  Commonly known as:  CIPRO  Take 1 tablet (500 mg total) by mouth 2 (two) times daily.     efavirenz-emtricitabine-tenofovir 600-200-300 MG per tablet  Commonly known as:  ATRIPLA  Take 1 tablet by mouth at bedtime.     ENSURE  Take 237 mLs by mouth 3 (three) times daily between meals.      ferrous sulfate 325 (65 FE) MG tablet  Take 1 tablet (325 mg total) by mouth 2 (two) times daily with a meal.     lidocaine-prilocaine cream  Commonly known as:  EMLA  APPLY TOPICALLY TO PORT AS DIRECTED.     metroNIDAZOLE 500 MG tablet  Commonly known as:  FLAGYL  Take 1 tablet (500 mg total) by mouth 3 (three) times daily.     potassium chloride SA 20 MEQ tablet  Commonly known as:  K-DUR,KLOR-CON  Take 1 tablet (20 mEq total) by mouth daily.     prochlorperazine 10 MG tablet  Commonly known as:  COMPAZINE  Take 1 tablet (10 mg total) by mouth every 6 (six) hours as needed for nausea or vomiting.  Allergies  Allergen Reactions  . Tylenol [Acetaminophen]     sweats   Follow-up Information   Follow up with AVBUERE,EDWIN A, MD In 2 weeks.   Specialty:  Internal Medicine   Contact information:   [place] [address] [phone]       Follow up with Scharlene Gloss, MD In 1 week.   Specialty:  Infectious Diseases   Contact information:   [address] [place] [address] [phone]        The results of significant diagnostics from this hospitalization (including imaging, microbiology, ancillary and laboratory) are listed below for reference.    Significant Diagnostic Studies: US Renal  01/20/2014   CLINICAL DATA:  Acute kidney injury.  EXAM: RENAL/URINARY TRACT ULTRASOUND COMPLETE  COMPARISON:  PET-CT 12/10/2013  FINDINGS: Right Kidney:  Length: 12 cm. Echogenicity is within normal limits. Hydronephrosis, but decreased from comparison PET-CT. The cause is not identified. A right ureteral jet is present, consistent with at least partial ureteral patency. There are echogenic medullary structures consistent with stones, underestimated by ultrasound compared to previous CT. At least 2 stones are seen in the upper and interpolar region, 6 to 7 mm individually.  Left Kidney:  Length: 13 cm. Echogenicity is within normal limits. No  hydronephrosis. Stone burden is underestimated compared to preceding CT. An lower pole calculus is noted at 7 mm and interpolar stone is noted at 5 mm.  Bladder:  Appears normal for degree of bladder distention.  Bilateral pleural effusions are noted. There is splenomegaly (618 cc volume) which is known per previous PET-CT imaging.  IMPRESSION: 1. Mild right hydronephrosis. Documented right ureteral jet/patency. 2. Bilateral nephrolithiasis. 3. Bilateral pleural effusions.   Electronically Signed   By: Jorje Guild M.D.   On: 01/20/2014 04:35   Dg Chest Port 1 View  01/19/2014   CLINICAL DATA:  Pneumonia.  EXAM: PORTABLE CHEST - 1 VIEW  COMPARISON:  Single view of the chest 01/18/2014. CT chest, abdomen and pelvis 08/04/2013.  FINDINGS: The patient is rotated on the study. Bilateral alveolar and interstitial opacities seen on the comparison studies has improved. Heart size is normal. No pneumothorax or pleural effusion is identified.  IMPRESSION: Improved aeration compatible with resolving pneumonia and/or edema.   Electronically Signed   By: Inge Rise M.D.   On: 01/19/2014 07:42   Dg Chest Port 1 View  01/18/2014   CLINICAL DATA:  Central line placement  EXAM: PORTABLE CHEST - 1 VIEW  COMPARISON:  01/08/2014  FINDINGS: Cardiomediastinal silhouette is stable. Persistent streaky bilateral interstitial prominence and patchy airspace disease consistent with asymmetric edema or pneumonia. Right Port-A-Cath is unchanged in position. There is a left IJ central line with tip in SVC. No pneumothorax.  IMPRESSION: Persistent streaky bilateral interstitial prominence and patchy airspace disease consistent with asymmetric edema or pneumonia. Right Port-A-Cath is unchanged in position. There is a left IJ central line with tip in SVC. No pneumothorax.   Electronically Signed   By: Lahoma Crocker M.D.   On: 01/18/2014 10:01   Dg Chest Portable 1 View  01/18/2014   CLINICAL DATA:  Volume overload.  Shortness of  breath, hypotension  EXAM: PORTABLE CHEST - 1 VIEW  COMPARISON:  Chest x-ray from the same day at 1:36 a.m.  FINDINGS: Right IJ porta catheter in stable position. Normal heart size. No change in upper mediastinal contours when accounting for portable technique.  There is new interstitial and airspace disease which is asymmetric to  the peripheral right chest.  IMPRESSION: New lung opacities, right more than left. Rapid development favors asymmetric edema, acute lung injury, or aspiration pneumonitis.   Electronically Signed   By: Jorje Guild M.D.   On: 01/18/2014 06:36   Dg Chest Port 1 View  01/18/2014   CLINICAL DATA:  Hypotension  EXAM: PORTABLE CHEST - 1 VIEW  COMPARISON:  08/02/2013  FINDINGS: Right IJ porta catheter, tip at the upper cavoatrial junction.  Normal heart size and mediastinal contours. No consolidation, edema, effusion, or pneumothorax. Remote surgical site at the right neck base.  IMPRESSION: No active disease.   Electronically Signed   By: Jorje Guild M.D.   On: 01/18/2014 02:22    Microbiology: Recent Results (from the past 240 hour(s))  CULTURE, BLOOD (ROUTINE X 2)     Status: None   Collection Time    01/18/14 12:55 AM      Result Value Ref Range Status   Specimen Description BLOOD LEFT ARM   Final   Special Requests BOTTLES DRAWN AEROBIC AND ANAEROBIC 5CC   Final   Culture  Setup Time     Final   Value: 01/18/2014 16:18     Performed at [facility]   Culture     Final   Value:        BLOOD CULTURE RECEIVED NO GROWTH TO DATE CULTURE WILL BE HELD FOR 5 DAYS BEFORE ISSUING A FINAL NEGATIVE REPORT     Performed at [facility]   Report Status PENDING   Incomplete  CULTURE, BLOOD (ROUTINE X 2)     Status: None   Collection Time    01/18/14  1:16 AM      Result Value Ref Range Status   Specimen Description BLOOD LEFT ANTECUBITAL   Final   Special Requests BOTTLES DRAWN AEROBIC AND ANAEROBIC 2CC   Final   Culture  Setup Time     Final   Value:  01/18/2014 16:19     Performed at [facility]   Culture     Final   Value:        BLOOD CULTURE RECEIVED NO GROWTH TO DATE CULTURE WILL BE HELD FOR 5 DAYS BEFORE ISSUING A FINAL NEGATIVE REPORT     Performed at [facility]   Report Status PENDING   Incomplete  URINE CULTURE     Status: None   Collection Time    01/18/14  3:42 AM      Result Value Ref Range Status   Specimen Description URINE, RANDOM   Final   Special Requests Immunocompromised   Final   Culture  Setup Time     Final   Value: 01/18/2014 16:29     Performed at [place]     Final   Value: NO GROWTH     Performed at [facility]   Culture     Final   Value: NO GROWTH     Performed at [facility]   Report Status 01/19/2014 FINAL   Final  MRSA PCR SCREENING     Status: None   Collection Time    01/18/14  7:11 AM      Result Value Ref Range Status   MRSA by PCR NEGATIVE  NEGATIVE Final   Comment:            The GeneXpert MRSA Assay (FDA     approved for NASAL specimens  only), is one component of a     comprehensive MRSA colonization     surveillance program. It is not     intended to diagnose MRSA     infection nor to guide or     monitor treatment for     MRSA infections.  CLOSTRIDIUM DIFFICILE BY PCR     Status: None   Collection Time    01/18/14 11:24 AM      Result Value Ref Range Status   C difficile by pcr NEGATIVE  NEGATIVE Final   Comment: Performed at [place]: Basic Metabolic Panel:  Recent Labs Lab 01/18/14 0303 01/18/14 0520 01/18/14 1550 01/19/14 0500 01/20/14 0455 01/21/14 1030 01/22/14 0430 01/22/14 1325  NA 138 133* 136* 141 143  --  141  --   K 3.5* 4.0 4.5 3.0* 2.8*  --  2.5* 3.5*  CL 109 105 108 108 113*  --  113*  --   CO2  --  9* 11* 15* 18*  --  17*  --   GLUCOSE 86 164* 106* 103* 91  --  87  --   BUN 60* 58* 57* 50* 34*  --  17  --   CREATININE 4.70* 4.36* 3.75* 3.37* 2.59*  --  1.98*   --   CALCIUM  --  6.4* 6.9* 6.8* 6.1*  --  6.0*  --   MG  --  1.5  --  1.6  --  1.0*  --   --   PHOS  --  4.0  --   --   --   --   --   --    Liver Function Tests:  Recent Labs Lab 01/16/14 0839 01/17/14 2345 01/19/14 0500 01/22/14 0430  AST 15 121* 33 13  ALT 13 123* 63* 24  ALKPHOS 147 119* 76 76  BILITOT 0.22 0.5 0.4 0.3  PROT 7.4 6.7 5.3* 5.2*  ALBUMIN 3.2* 2.8* 2.1* 2.2*   No results found for this basename: LIPASE, AMYLASE,  in the last 168 hours No results found for this basename: AMMONIA,  in the last 168 hours CBC:  Recent Labs Lab 01/16/14 0838  01/17/14 2345  01/18/14 0520 01/19/14 0500 01/19/14 2010 01/20/14 0455 01/22/14 0430  WBC 3.8*  < > 2.0*  --  4.8 4.3 6.1 3.7* 2.0*  NEUTROABS 2.8  --  1.5*  --   --   --   --   --   --   HGB 10.8*  --  8.5*  < > 8.4* 6.3* 7.2* 7.0* 7.4*  HCT 32.6*  --  24.0*  < > 23.5* 17.2* 19.5* 18.9* 21.3*  MCV 107.3*  < > 99.6  --  98.7 96.6 93.8 94.0 96.4  PLT 161  < > 22*  --  27* 16* 13* 25* 41*  < > = values in this interval not displayed. Cardiac Enzymes:  Recent Labs Lab 01/18/14 0514  TROPONINI <0.30   BNP: BNP (last 3 results) No results found for this basename: PROBNP,  in the last 8760 hours CBG: No results found for this basename: GLUCAP,  in the last 168 hours     Signed:  Kelvin Cellar  [facility] 01/22/2014, 2:56 PM

## 2014-01-24 LAB — CULTURE, BLOOD (ROUTINE X 2)
Culture: NO GROWTH
Culture: NO GROWTH

## 2014-01-26 LAB — HLA B*5701: HLA B 5701: NEGATIVE

## 2014-01-29 ENCOUNTER — Ambulatory Visit: Payer: Medicare Other | Admitting: Internal Medicine

## 2014-01-30 ENCOUNTER — Ambulatory Visit: Payer: Medicare Other | Admitting: Physician Assistant

## 2014-01-30 ENCOUNTER — Other Ambulatory Visit: Payer: Medicare Other

## 2014-01-30 ENCOUNTER — Ambulatory Visit: Payer: Medicare Other

## 2014-01-31 ENCOUNTER — Ambulatory Visit: Payer: Medicare Other

## 2014-02-13 ENCOUNTER — Telehealth: Payer: Self-pay | Admitting: Oncology

## 2014-02-13 ENCOUNTER — Other Ambulatory Visit (HOSPITAL_BASED_OUTPATIENT_CLINIC_OR_DEPARTMENT_OTHER): Payer: Medicare Other

## 2014-02-13 ENCOUNTER — Ambulatory Visit (HOSPITAL_BASED_OUTPATIENT_CLINIC_OR_DEPARTMENT_OTHER): Payer: Medicare Other | Admitting: Oncology

## 2014-02-13 ENCOUNTER — Ambulatory Visit (HOSPITAL_BASED_OUTPATIENT_CLINIC_OR_DEPARTMENT_OTHER): Payer: Medicare Other

## 2014-02-13 VITALS — BP 99/60 | HR 83 | Temp 97.9°F | Resp 18 | Ht 73.0 in | Wt 143.5 lb

## 2014-02-13 DIAGNOSIS — R634 Abnormal weight loss: Secondary | ICD-10-CM | POA: Diagnosis not present

## 2014-02-13 DIAGNOSIS — C8198 Hodgkin lymphoma, unspecified, lymph nodes of multiple sites: Secondary | ICD-10-CM

## 2014-02-13 DIAGNOSIS — D72819 Decreased white blood cell count, unspecified: Secondary | ICD-10-CM | POA: Diagnosis not present

## 2014-02-13 DIAGNOSIS — Z5111 Encounter for antineoplastic chemotherapy: Secondary | ICD-10-CM | POA: Diagnosis not present

## 2014-02-13 DIAGNOSIS — L738 Other specified follicular disorders: Secondary | ICD-10-CM | POA: Diagnosis not present

## 2014-02-13 DIAGNOSIS — N289 Disorder of kidney and ureter, unspecified: Secondary | ICD-10-CM | POA: Diagnosis not present

## 2014-02-13 DIAGNOSIS — N201 Calculus of ureter: Secondary | ICD-10-CM | POA: Diagnosis not present

## 2014-02-13 DIAGNOSIS — D696 Thrombocytopenia, unspecified: Secondary | ICD-10-CM

## 2014-02-13 DIAGNOSIS — D539 Nutritional anemia, unspecified: Secondary | ICD-10-CM | POA: Diagnosis not present

## 2014-02-13 DIAGNOSIS — D649 Anemia, unspecified: Secondary | ICD-10-CM | POA: Diagnosis not present

## 2014-02-13 DIAGNOSIS — R599 Enlarged lymph nodes, unspecified: Secondary | ICD-10-CM | POA: Diagnosis not present

## 2014-02-13 DIAGNOSIS — C8190 Hodgkin lymphoma, unspecified, unspecified site: Secondary | ICD-10-CM

## 2014-02-13 DIAGNOSIS — Z21 Asymptomatic human immunodeficiency virus [HIV] infection status: Secondary | ICD-10-CM | POA: Diagnosis not present

## 2014-02-13 DIAGNOSIS — E876 Hypokalemia: Secondary | ICD-10-CM | POA: Diagnosis not present

## 2014-02-13 DIAGNOSIS — B20 Human immunodeficiency virus [HIV] disease: Secondary | ICD-10-CM

## 2014-02-13 LAB — COMPREHENSIVE METABOLIC PANEL (CC13)
ALK PHOS: 258 U/L — AB (ref 40–150)
ALT: 17 U/L (ref 0–55)
AST: 22 U/L (ref 5–34)
Albumin: 3.1 g/dL — ABNORMAL LOW (ref 3.5–5.0)
Anion Gap: 7 mEq/L (ref 3–11)
BILIRUBIN TOTAL: 0.2 mg/dL (ref 0.20–1.20)
BUN: 12.4 mg/dL (ref 7.0–26.0)
CO2: 22 mEq/L (ref 22–29)
Calcium: 9.1 mg/dL (ref 8.4–10.4)
Chloride: 111 mEq/L — ABNORMAL HIGH (ref 98–109)
Creatinine: 1.6 mg/dL — ABNORMAL HIGH (ref 0.7–1.3)
Glucose: 91 mg/dl (ref 70–140)
Potassium: 3.6 mEq/L (ref 3.5–5.1)
SODIUM: 140 meq/L (ref 136–145)
Total Protein: 7.4 g/dL (ref 6.4–8.3)

## 2014-02-13 LAB — CBC WITH DIFFERENTIAL/PLATELET
BASO%: 1 % (ref 0.0–2.0)
BASOS ABS: 0 10*3/uL (ref 0.0–0.1)
EOS ABS: 0.1 10*3/uL (ref 0.0–0.5)
EOS%: 2.7 % (ref 0.0–7.0)
HCT: 31.6 % — ABNORMAL LOW (ref 38.4–49.9)
HGB: 10.5 g/dL — ABNORMAL LOW (ref 13.0–17.1)
LYMPH%: 32.9 % (ref 14.0–49.0)
MCH: 34 pg — AB (ref 27.2–33.4)
MCHC: 33.3 g/dL (ref 32.0–36.0)
MCV: 102.2 fL — AB (ref 79.3–98.0)
MONO#: 0.4 10*3/uL (ref 0.1–0.9)
MONO%: 12.7 % (ref 0.0–14.0)
NEUT%: 50.7 % (ref 39.0–75.0)
NEUTROS ABS: 1.5 10*3/uL (ref 1.5–6.5)
Platelets: 155 10*3/uL (ref 140–400)
RBC: 3.09 10*6/uL — ABNORMAL LOW (ref 4.20–5.82)
RDW: 18.8 % — ABNORMAL HIGH (ref 11.0–14.6)
WBC: 2.9 10*3/uL — ABNORMAL LOW (ref 4.0–10.3)
lymph#: 0.9 10*3/uL (ref 0.9–3.3)

## 2014-02-13 MED ORDER — SODIUM CHLORIDE 0.9 % IV SOLN
10.0000 [IU]/m2 | Freq: Once | INTRAVENOUS | Status: AC
  Administered 2014-02-13: 16 [IU] via INTRAVENOUS
  Filled 2014-02-13: qty 5.33

## 2014-02-13 MED ORDER — HEPARIN SOD (PORK) LOCK FLUSH 100 UNIT/ML IV SOLN
500.0000 [IU] | Freq: Once | INTRAVENOUS | Status: AC | PRN
  Administered 2014-02-13: 500 [IU]
  Filled 2014-02-13: qty 5

## 2014-02-13 MED ORDER — ONDANSETRON 16 MG/50ML IVPB (CHCC)
16.0000 mg | Freq: Once | INTRAVENOUS | Status: AC
  Administered 2014-02-13: 16 mg via INTRAVENOUS

## 2014-02-13 MED ORDER — DACARBAZINE 200 MG IV SOLR
375.0000 mg/m2 | Freq: Once | INTRAVENOUS | Status: AC
  Administered 2014-02-13: 600 mg via INTRAVENOUS
  Filled 2014-02-13: qty 30

## 2014-02-13 MED ORDER — DEXAMETHASONE SODIUM PHOSPHATE 20 MG/5ML IJ SOLN
INTRAMUSCULAR | Status: AC
  Filled 2014-02-13: qty 5

## 2014-02-13 MED ORDER — SODIUM CHLORIDE 0.9 % IJ SOLN
10.0000 mL | INTRAMUSCULAR | Status: DC | PRN
  Administered 2014-02-13: 10 mL
  Filled 2014-02-13: qty 10

## 2014-02-13 MED ORDER — DOXORUBICIN HCL CHEMO IV INJECTION 2 MG/ML
25.0000 mg/m2 | Freq: Once | INTRAVENOUS | Status: AC
  Administered 2014-02-13: 40 mg via INTRAVENOUS
  Filled 2014-02-13: qty 20

## 2014-02-13 MED ORDER — DEXAMETHASONE SODIUM PHOSPHATE 20 MG/5ML IJ SOLN
20.0000 mg | Freq: Once | INTRAMUSCULAR | Status: AC
  Administered 2014-02-13: 20 mg via INTRAVENOUS

## 2014-02-13 MED ORDER — VINBLASTINE SULFATE CHEMO INJECTION 1 MG/ML
6.0000 mg/m2 | Freq: Once | INTRAVENOUS | Status: AC
  Administered 2014-02-13: 10 mg via INTRAVENOUS
  Filled 2014-02-13: qty 10

## 2014-02-13 MED ORDER — ONDANSETRON 16 MG/50ML IVPB (CHCC)
INTRAVENOUS | Status: AC
  Filled 2014-02-13: qty 16

## 2014-02-13 MED ORDER — SODIUM CHLORIDE 0.9 % IV SOLN
Freq: Once | INTRAVENOUS | Status: AC
  Administered 2014-02-13: 10:00:00 via INTRAVENOUS

## 2014-02-13 NOTE — Patient Instructions (Signed)
[facility] Discharge Instructions for Patients Receiving Chemotherapy  Today you received the following chemotherapy agents: adriamycin, vinblastine, bleomycin, dacarbazine  To help prevent nausea and vomiting after your treatment, we encourage you to take your nausea medication.  Take it as often as prescribed.     If you develop nausea and vomiting that is not controlled by your nausea medication, call the clinic. If it is after clinic hours your family physician or the after hours number for the clinic or go to the Emergency Department.   BELOW ARE SYMPTOMS THAT SHOULD BE REPORTED IMMEDIATELY:  *FEVER GREATER THAN 100.5 F  *CHILLS WITH OR WITHOUT FEVER  NAUSEA AND VOMITING THAT IS NOT CONTROLLED WITH YOUR NAUSEA MEDICATION  *UNUSUAL SHORTNESS OF BREATH  *UNUSUAL BRUISING OR BLEEDING  TENDERNESS IN MOUTH AND THROAT WITH OR WITHOUT PRESENCE OF ULCERS  *URINARY PROBLEMS  *BOWEL PROBLEMS  UNUSUAL RASH Items with * indicate a potential emergency and should be followed up as soon as possible.  Feel free to call the clinic you have any questions or concerns. The clinic phone number is [phone].   I have been informed and understand all the instructions given to me. I know to contact the clinic, my physician, or go to the Emergency Department if any problems should occur. I do not have any questions at this time, but understand that I may call the clinic during office hours   should I have any questions or need assistance in obtaining follow up care.    __________________________________________  _____________  __________ Signature of Patient or Authorized Representative            Date                   Time    __________________________________________ Nurse's Signature

## 2014-02-13 NOTE — Telephone Encounter (Signed)
gv and printed appt sched and avs for pt for July thru Sept...sed added tx. °

## 2014-02-13 NOTE — Progress Notes (Signed)
OK to treat with creatinine 1.6 per Dr. Alen Blew.   Greater than 10% variance in dose ok per pharmacy.  Per Montel Clock, Dr. Alen Blew did not want dose adjusted.

## 2014-02-13 NOTE — Progress Notes (Signed)
Hematology and Oncology Follow Up Visit  Thomas [name] [id number] 04/22/1981 33 y.o. 02/13/2014 9:24 AM   Principle Diagnosis:  33 year old HIV positive gentleman diagnosed with Hodgkin's disease in January of 2015 after he presented with anemia and diffuse lymphadenopathy. Stage IIIA disease.    Prior Therapy: He is status post supraclavicular right lymph node biopsy done on 08/02/2013.  Current therapy: Systemic chemotherapy in the form of ABVD started on 10/03/2013. He is here for the start of cycle 5 (day 1).   Interim History:  Thomas [name] presents today for a followup visit by himself. Since his last visit, he missed the beginning of cycle 5 on 01/30/2014 due to hospitalization. He presented with acute febrile illness and diarrhea and developed septic shock. He did require brief ICU stay and pressor support. He recovered well and was discharged without any he did require transfusions of packed red cells and platelets. Since his discharge, he has felt better and no longer reporting any diarrhea or fevers. He continues to gain weight and have improved appetite. He also noticed significant improvement in his quality of life and energy level. He is tolerating his chemotherapy without He denies any nausea, vomiting or abdominal pain. He does not report any chest pain or shortness of breath. He denies fevers or chills or sweats. Denied pruritus or any constitutional symptoms. He is no longer reporting any lymphadenopathy. Denied hematochezia or melena or any bleeding problems. He does not report any headaches or blurry vision or double vision. Did not report any alteration of mental status her psychiatric issues. As that report any syncope or seizures. Does not report any chest pain palpitation or difficulty breathing. Does not report any leg edema or orthopnea. He does not report any abdominal distention or early satiety. As that report any ascites. Does not report any musculoskeletal complaints. Does  not report any arthralgias or myalgias or skin rashes or lymphadenopathy. Is not reporting any petechiae rashes or bleeding. Rest of his review of system is unremarkable.  Medications: I have reviewed the patient's current medications. Unchanged by my review today Current Outpatient Prescriptions  Medication Sig Dispense Refill  . calcium carbonate (TUMS - DOSED IN MG ELEMENTAL CALCIUM) 500 MG chewable tablet Chew 4 tablets (800 mg of elemental calcium total) by mouth 3 (three) times daily.  90 tablet  0  . ciprofloxacin (CIPRO) 500 MG tablet Take 1 tablet (500 mg total) by mouth 2 (two) times daily.  12 tablet  0  . efavirenz-emtricitabine-tenofovir (ATRIPLA) 600-200-300 MG per tablet Take 1 tablet by mouth at bedtime.      . ENSURE (ENSURE) Take 237 mLs by mouth 3 (three) times daily between meals.  237 mL  12  . ferrous sulfate 325 (65 FE) MG tablet Take 1 tablet (325 mg total) by mouth 2 (two) times daily with a meal.  60 tablet  5  . lidocaine-prilocaine (EMLA) cream APPLY TOPICALLY TO PORT AS DIRECTED.  1 each  0  . metroNIDAZOLE (FLAGYL) 500 MG tablet Take 1 tablet (500 mg total) by mouth 3 (three) times daily.  18 tablet  0  . potassium chloride SA (K-DUR,KLOR-CON) 20 MEQ tablet Take 1 tablet (20 mEq total) by mouth daily.  10 tablet  0  . prochlorperazine (COMPAZINE) 10 MG tablet Take 1 tablet (10 mg total) by mouth every 6 (six) hours as needed for nausea or vomiting.  30 tablet  0   No current facility-administered medications for this visit.   Facility-Administered Medications Ordered  in Other Visits  Medication Dose Route Frequency Provider Last Rate Last Dose  . sodium chloride 0.9 % injection 10 mL  10 mL Intravenous PRN Wyatt Portela, MD   10 mL at 10/31/13 1601     Allergies:  Allergies  Allergen Reactions  . Tylenol [Acetaminophen]     sweats    Past Medical History, Surgical history, Social history, and Family History were reviewed and updated.    Physical  Exam: Blood pressure 99/60, pulse 83, temperature 97.9 F (36.6 C), temperature source Oral, resp. rate 18, height 6\' 1"  (1.854 m), weight 143 lb 8 oz (65.091 kg).  ECOG: 1  General appearance: alert and cooperative not in any distress. Head: Normocephalic, without obvious abnormality, atraumatic Neck: no carotid bruit no thyroid masses or lymphadenopathy. Lymph nodes: No lymphadenopathy noted.  Heart:regular rate and rhythm, S1, S2 normal, no murmur, click, rub or gallop Lung:chest clear, no wheezing, rales, normal symmetric air entry Abdomin: soft, non-tender, without masses or organomegaly EXT:no erythema, induration, or nodules Skin: Dry skin noted. Neurological exam: No motor or since deficits.    Lab Results: Lab Results  Component Value Date   WBC 2.9* 02/13/2014   HGB 10.5* 02/13/2014   HCT 31.6* 02/13/2014   MCV 102.2* 02/13/2014   PLT 155 02/13/2014     Chemistry      Component Value Date/Time   NA 141 01/22/2014 0430   NA 139 01/16/2014 0839   K 3.5* 01/22/2014 1325   K 3.4* 01/16/2014 0839   CL 113* 01/22/2014 0430   CO2 17* 01/22/2014 0430   CO2 20* 01/16/2014 0839   BUN 17 01/22/2014 0430   BUN 15.8 01/16/2014 0839   CREATININE 1.98* 01/22/2014 0430   CREATININE 1.6* 01/16/2014 0839   CREATININE 0.85 06/17/2013 1658      Component Value Date/Time   CALCIUM 6.0* 01/22/2014 0430   CALCIUM 9.0 01/16/2014 0839   ALKPHOS 76 01/22/2014 0430   ALKPHOS 147 01/16/2014 0839   AST 13 01/22/2014 0430   AST 15 01/16/2014 0839   ALT 24 01/22/2014 0430   ALT 13 01/16/2014 0839   BILITOT 0.3 01/22/2014 0430   BILITOT 0.22 01/16/2014 0839      Impression and Plan:  33 year old gentleman with the following issues:  1. Hodgkin's disease presented with lymphadenopathy above and below the diaphragm as well as bone marrow involvement without any constitutional symptoms indicating at least stage IIIA. He is status post 3 cycles of ABVD with a PET/CT scan on 12/10/2013 showed near-complete response.  The plan is to continue with the current regimen for a total of 6 cycles. He will receive cycle 5 day 1 today for a total of 6 cycles.   2. Anemia: This is undoubtedly related to his Hodgkin's disease. His hemoglobin has improved and does not require any intervention. No need for any packed red cell transfusions.  3. IV access: Port-A-Cath has already been inserted and is utilizing EMLA cream. No erythema or induration or infection around the site.  4. Nausea prophylaxis: A prescription for anti-emetics is available for him to take. Unchanged at this time.  5. HIV: He is on Atripla and followed by infectious disease service.  6. Thrombocytopenia: Likely related to his malignancy involvement of the bone marrow. His count has been restored to normal range.  7. Neutropenic prophylaxis: To receive Neulasta after each chemotherapy treatment.  8. Hypokalemia: Repeat potassium is pending from today and we'll continue to monitor.   9. Mild renal  insufficiency: His creatinine has increased during his hospitalization up to 5.12 but now have returned back to close to baseline.  10. Followup: Will be in 2 weeks for day 15 of cycle 5.   [facility], MD 7/24/20159:24 AM

## 2014-02-14 ENCOUNTER — Telehealth: Payer: Self-pay | Admitting: *Deleted

## 2014-02-14 ENCOUNTER — Ambulatory Visit: Payer: Medicare Other

## 2014-02-14 NOTE — Telephone Encounter (Addendum)
Attempted to call pt & listed phone # doesn't work.  Tried mother's work # & left message for her to call us but never heard from her.  Will report to Dr. Alen Blew.

## 2014-02-16 ENCOUNTER — Ambulatory Visit (HOSPITAL_BASED_OUTPATIENT_CLINIC_OR_DEPARTMENT_OTHER): Payer: Medicare Other

## 2014-02-16 VITALS — BP 93/62 | HR 108 | Temp 97.9°F

## 2014-02-16 DIAGNOSIS — B20 Human immunodeficiency virus [HIV] disease: Secondary | ICD-10-CM | POA: Diagnosis not present

## 2014-02-16 DIAGNOSIS — D696 Thrombocytopenia, unspecified: Secondary | ICD-10-CM | POA: Diagnosis not present

## 2014-02-16 DIAGNOSIS — N289 Disorder of kidney and ureter, unspecified: Secondary | ICD-10-CM | POA: Diagnosis not present

## 2014-02-16 DIAGNOSIS — C8198 Hodgkin lymphoma, unspecified, lymph nodes of multiple sites: Secondary | ICD-10-CM

## 2014-02-16 DIAGNOSIS — R599 Enlarged lymph nodes, unspecified: Secondary | ICD-10-CM | POA: Diagnosis not present

## 2014-02-16 DIAGNOSIS — Z5189 Encounter for other specified aftercare: Secondary | ICD-10-CM

## 2014-02-16 DIAGNOSIS — E876 Hypokalemia: Secondary | ICD-10-CM | POA: Diagnosis not present

## 2014-02-16 DIAGNOSIS — D539 Nutritional anemia, unspecified: Secondary | ICD-10-CM | POA: Diagnosis not present

## 2014-02-16 DIAGNOSIS — D649 Anemia, unspecified: Secondary | ICD-10-CM | POA: Diagnosis not present

## 2014-02-16 DIAGNOSIS — C8190 Hodgkin lymphoma, unspecified, unspecified site: Secondary | ICD-10-CM

## 2014-02-16 DIAGNOSIS — Z5111 Encounter for antineoplastic chemotherapy: Secondary | ICD-10-CM | POA: Diagnosis not present

## 2014-02-16 DIAGNOSIS — L738 Other specified follicular disorders: Secondary | ICD-10-CM | POA: Diagnosis not present

## 2014-02-16 DIAGNOSIS — N201 Calculus of ureter: Secondary | ICD-10-CM | POA: Diagnosis not present

## 2014-02-16 DIAGNOSIS — R634 Abnormal weight loss: Secondary | ICD-10-CM | POA: Diagnosis not present

## 2014-02-16 DIAGNOSIS — D72819 Decreased white blood cell count, unspecified: Secondary | ICD-10-CM | POA: Diagnosis not present

## 2014-02-16 DIAGNOSIS — Z21 Asymptomatic human immunodeficiency virus [HIV] infection status: Secondary | ICD-10-CM | POA: Diagnosis not present

## 2014-02-16 MED ORDER — PEGFILGRASTIM INJECTION 6 MG/0.6ML
6.0000 mg | Freq: Once | SUBCUTANEOUS | Status: AC
  Administered 2014-02-16: 6 mg via SUBCUTANEOUS
  Filled 2014-02-16: qty 0.6

## 2014-02-23 ENCOUNTER — Emergency Department (HOSPITAL_COMMUNITY): Payer: Medicare Other

## 2014-02-23 ENCOUNTER — Encounter (HOSPITAL_COMMUNITY): Payer: Self-pay | Admitting: Emergency Medicine

## 2014-02-23 ENCOUNTER — Inpatient Hospital Stay (HOSPITAL_COMMUNITY)
Admission: EM | Admit: 2014-02-23 | Discharge: 2014-03-03 | DRG: 659 | Disposition: A | Discharge status: Home Health | Payer: Medicare Other | Attending: Internal Medicine | Admitting: Internal Medicine

## 2014-02-23 DIAGNOSIS — Z79899 Other long term (current) drug therapy: Secondary | ICD-10-CM | POA: Diagnosis not present

## 2014-02-23 DIAGNOSIS — N201 Calculus of ureter: Secondary | ICD-10-CM | POA: Diagnosis present

## 2014-02-23 DIAGNOSIS — E43 Unspecified severe protein-calorie malnutrition: Secondary | ICD-10-CM | POA: Diagnosis not present

## 2014-02-23 DIAGNOSIS — Z21 Asymptomatic human immunodeficiency virus [HIV] infection status: Secondary | ICD-10-CM | POA: Diagnosis not present

## 2014-02-23 DIAGNOSIS — I959 Hypotension, unspecified: Secondary | ICD-10-CM | POA: Diagnosis not present

## 2014-02-23 DIAGNOSIS — D638 Anemia in other chronic diseases classified elsewhere: Secondary | ICD-10-CM | POA: Diagnosis present

## 2014-02-23 DIAGNOSIS — E876 Hypokalemia: Secondary | ICD-10-CM | POA: Diagnosis present

## 2014-02-23 DIAGNOSIS — C8589 Other specified types of non-Hodgkin lymphoma, extranodal and solid organ sites: Secondary | ICD-10-CM | POA: Diagnosis not present

## 2014-02-23 DIAGNOSIS — N179 Acute kidney failure, unspecified: Principal | ICD-10-CM | POA: Diagnosis present

## 2014-02-23 DIAGNOSIS — C8198 Hodgkin lymphoma, unspecified, lymph nodes of multiple sites: Secondary | ICD-10-CM | POA: Diagnosis not present

## 2014-02-23 DIAGNOSIS — I9589 Other hypotension: Secondary | ICD-10-CM

## 2014-02-23 DIAGNOSIS — Y849 Medical procedure, unspecified as the cause of abnormal reaction of the patient, or of later complication, without mention of misadventure at the time of the procedure: Secondary | ICD-10-CM | POA: Diagnosis present

## 2014-02-23 DIAGNOSIS — Z452 Encounter for adjustment and management of vascular access device: Secondary | ICD-10-CM | POA: Diagnosis not present

## 2014-02-23 DIAGNOSIS — A419 Sepsis, unspecified organism: Secondary | ICD-10-CM | POA: Diagnosis not present

## 2014-02-23 DIAGNOSIS — N189 Chronic kidney disease, unspecified: Secondary | ICD-10-CM | POA: Diagnosis present

## 2014-02-23 DIAGNOSIS — R7881 Bacteremia: Secondary | ICD-10-CM | POA: Diagnosis present

## 2014-02-23 DIAGNOSIS — E41 Nutritional marasmus: Secondary | ICD-10-CM | POA: Diagnosis present

## 2014-02-23 DIAGNOSIS — E872 Acidosis, unspecified: Secondary | ICD-10-CM | POA: Diagnosis present

## 2014-02-23 DIAGNOSIS — Z87891 Personal history of nicotine dependence: Secondary | ICD-10-CM

## 2014-02-23 DIAGNOSIS — Z9481 Bone marrow transplant status: Secondary | ICD-10-CM

## 2014-02-23 DIAGNOSIS — N178 Other acute kidney failure: Secondary | ICD-10-CM

## 2014-02-23 DIAGNOSIS — R0989 Other specified symptoms and signs involving the circulatory and respiratory systems: Secondary | ICD-10-CM | POA: Diagnosis not present

## 2014-02-23 DIAGNOSIS — N132 Hydronephrosis with renal and ureteral calculous obstruction: Secondary | ICD-10-CM

## 2014-02-23 DIAGNOSIS — R9089 Other abnormal findings on diagnostic imaging of central nervous system: Secondary | ICD-10-CM

## 2014-02-23 DIAGNOSIS — R6521 Severe sepsis with septic shock: Secondary | ICD-10-CM

## 2014-02-23 DIAGNOSIS — N200 Calculus of kidney: Secondary | ICD-10-CM

## 2014-02-23 DIAGNOSIS — N211 Calculus in urethra: Secondary | ICD-10-CM | POA: Diagnosis not present

## 2014-02-23 DIAGNOSIS — R031 Nonspecific low blood-pressure reading: Secondary | ICD-10-CM | POA: Diagnosis not present

## 2014-02-23 DIAGNOSIS — R6520 Severe sepsis without septic shock: Secondary | ICD-10-CM | POA: Diagnosis not present

## 2014-02-23 DIAGNOSIS — I38 Endocarditis, valve unspecified: Secondary | ICD-10-CM | POA: Diagnosis not present

## 2014-02-23 DIAGNOSIS — B20 Human immunodeficiency virus [HIV] disease: Secondary | ICD-10-CM | POA: Diagnosis present

## 2014-02-23 DIAGNOSIS — A4901 Methicillin susceptible Staphylococcus aureus infection, unspecified site: Secondary | ICD-10-CM | POA: Diagnosis present

## 2014-02-23 DIAGNOSIS — R29818 Other symptoms and signs involving the nervous system: Secondary | ICD-10-CM | POA: Diagnosis not present

## 2014-02-23 DIAGNOSIS — D696 Thrombocytopenia, unspecified: Secondary | ICD-10-CM | POA: Diagnosis not present

## 2014-02-23 DIAGNOSIS — R21 Rash and other nonspecific skin eruption: Secondary | ICD-10-CM | POA: Diagnosis not present

## 2014-02-23 DIAGNOSIS — N1330 Unspecified hydronephrosis: Secondary | ICD-10-CM | POA: Diagnosis present

## 2014-02-23 DIAGNOSIS — Z681 Body mass index (BMI) 19 or less, adult: Secondary | ICD-10-CM

## 2014-02-23 DIAGNOSIS — N135 Crossing vessel and stricture of ureter without hydronephrosis: Secondary | ICD-10-CM | POA: Diagnosis not present

## 2014-02-23 DIAGNOSIS — G96198 Other disorders of meninges, not elsewhere classified: Secondary | ICD-10-CM | POA: Diagnosis not present

## 2014-02-23 DIAGNOSIS — B029 Zoster without complications: Secondary | ICD-10-CM | POA: Diagnosis present

## 2014-02-23 DIAGNOSIS — R839 Unspecified abnormal finding in cerebrospinal fluid: Secondary | ICD-10-CM | POA: Diagnosis not present

## 2014-02-23 DIAGNOSIS — R209 Unspecified disturbances of skin sensation: Secondary | ICD-10-CM | POA: Diagnosis not present

## 2014-02-23 DIAGNOSIS — N19 Unspecified kidney failure: Secondary | ICD-10-CM | POA: Diagnosis not present

## 2014-02-23 DIAGNOSIS — C8190 Hodgkin lymphoma, unspecified, unspecified site: Secondary | ICD-10-CM | POA: Diagnosis present

## 2014-02-23 DIAGNOSIS — T80211A Bloodstream infection due to central venous catheter, initial encounter: Secondary | ICD-10-CM | POA: Diagnosis present

## 2014-02-23 DIAGNOSIS — Z48816 Encounter for surgical aftercare following surgery on the genitourinary system: Secondary | ICD-10-CM | POA: Diagnosis not present

## 2014-02-23 DIAGNOSIS — B9561 Methicillin susceptible Staphylococcus aureus infection as the cause of diseases classified elsewhere: Secondary | ICD-10-CM | POA: Diagnosis present

## 2014-02-23 DIAGNOSIS — R200 Anesthesia of skin: Secondary | ICD-10-CM | POA: Diagnosis not present

## 2014-02-23 LAB — COMPREHENSIVE METABOLIC PANEL
ALT: 25 U/L (ref 0–53)
AST: 18 U/L (ref 0–37)
Albumin: 3.2 g/dL — ABNORMAL LOW (ref 3.5–5.2)
Alkaline Phosphatase: 183 U/L — ABNORMAL HIGH (ref 39–117)
Anion gap: 16 — ABNORMAL HIGH (ref 5–15)
BILIRUBIN TOTAL: 0.3 mg/dL (ref 0.3–1.2)
BUN: 50 mg/dL — ABNORMAL HIGH (ref 6–23)
CALCIUM: 8.4 mg/dL (ref 8.4–10.5)
CHLORIDE: 101 meq/L (ref 96–112)
CO2: 15 mEq/L — ABNORMAL LOW (ref 19–32)
CREATININE: 3.93 mg/dL — AB (ref 0.50–1.35)
GFR, EST AFRICAN AMERICAN: 22 mL/min — AB (ref >90)
GFR, EST NON AFRICAN AMERICAN: 19 mL/min — AB (ref >90)
GLUCOSE: 90 mg/dL (ref 70–99)
Potassium: 3.2 mEq/L — ABNORMAL LOW (ref 3.7–5.3)
Sodium: 132 mEq/L — ABNORMAL LOW (ref 137–147)
Total Protein: 7.9 g/dL (ref 6.0–8.3)

## 2014-02-23 LAB — I-STAT CG4 LACTIC ACID, ED: Lactic Acid, Venous: 1.05 mmol/L (ref 0.5–2.2)

## 2014-02-23 LAB — CBC WITH DIFFERENTIAL/PLATELET
Basophils Absolute: 0 10*3/uL (ref 0.0–0.1)
Basophils Relative: 0 % (ref 0–1)
EOS PCT: 3 % (ref 0–5)
Eosinophils Absolute: 0.2 10*3/uL (ref 0.0–0.7)
HCT: 28.5 % — ABNORMAL LOW (ref 39.0–52.0)
Hemoglobin: 9.9 g/dL — ABNORMAL LOW (ref 13.0–17.0)
LYMPHS PCT: 9 % — AB (ref 12–46)
Lymphs Abs: 0.6 10*3/uL — ABNORMAL LOW (ref 0.7–4.0)
MCH: 34 pg (ref 26.0–34.0)
MCHC: 34.7 g/dL (ref 30.0–36.0)
MCV: 97.9 fL (ref 78.0–100.0)
MONO ABS: 0.4 10*3/uL (ref 0.1–1.0)
MONOS PCT: 6 % (ref 3–12)
NEUTROS PCT: 82 % — AB (ref 43–77)
Neutro Abs: 5.1 10*3/uL (ref 1.7–7.7)
Platelets: 88 10*3/uL — ABNORMAL LOW (ref 150–400)
RBC: 2.91 MIL/uL — AB (ref 4.22–5.81)
RDW: 16.2 % — ABNORMAL HIGH (ref 11.5–15.5)
WBC: 6.3 10*3/uL (ref 4.0–10.5)

## 2014-02-23 LAB — POC OCCULT BLOOD, ED: Fecal Occult Bld: NEGATIVE

## 2014-02-23 LAB — URINALYSIS, ROUTINE W REFLEX MICROSCOPIC
Bilirubin Urine: NEGATIVE
GLUCOSE, UA: NEGATIVE mg/dL
Ketones, ur: NEGATIVE mg/dL
Nitrite: NEGATIVE
PROTEIN: 100 mg/dL — AB
Specific Gravity, Urine: 1.012 (ref 1.005–1.030)
UROBILINOGEN UA: 0.2 mg/dL (ref 0.0–1.0)
pH: 6.5 (ref 5.0–8.0)

## 2014-02-23 LAB — URINE MICROSCOPIC-ADD ON

## 2014-02-23 LAB — SODIUM, URINE, RANDOM: Sodium, Ur: 42 mEq/L

## 2014-02-23 LAB — RPR

## 2014-02-23 MED ORDER — DIPHENHYDRAMINE HCL 25 MG PO CAPS
25.0000 mg | ORAL_CAPSULE | Freq: Once | ORAL | Status: AC
  Administered 2014-02-23: 25 mg via ORAL
  Filled 2014-02-23: qty 1

## 2014-02-23 MED ORDER — EFAVIRENZ-EMTRICITAB-TENOFOVIR 600-200-300 MG PO TABS
1.0000 | ORAL_TABLET | Freq: Every day | ORAL | Status: DC
  Administered 2014-02-23: 1 via ORAL
  Filled 2014-02-23 (×3): qty 1

## 2014-02-23 MED ORDER — SODIUM CHLORIDE 0.9 % IV SOLN
INTRAVENOUS | Status: DC
  Administered 2014-02-23 – 2014-02-24 (×2): via INTRAVENOUS

## 2014-02-23 MED ORDER — CALCIUM CARBONATE ANTACID 500 MG PO CHEW
800.0000 mg | CHEWABLE_TABLET | Freq: Three times a day (TID) | ORAL | Status: DC
  Administered 2014-02-23 – 2014-03-03 (×21): 800 mg via ORAL
  Filled 2014-02-23 (×26): qty 4

## 2014-02-23 MED ORDER — POTASSIUM CHLORIDE CRYS ER 20 MEQ PO TBCR: 20.0000 meq | EXTENDED_RELEASE_TABLET | Freq: Every day | ORAL | Status: DC

## 2014-02-23 MED ORDER — FERROUS SULFATE 325 (65 FE) MG PO TABS
325.0000 mg | ORAL_TABLET | Freq: Two times a day (BID) | ORAL | Status: DC
  Administered 2014-02-24 – 2014-03-03 (×13): 325 mg via ORAL
  Filled 2014-02-23 (×17): qty 1

## 2014-02-23 MED ORDER — POTASSIUM CHLORIDE CRYS ER 20 MEQ PO TBCR
40.0000 meq | EXTENDED_RELEASE_TABLET | Freq: Once | ORAL | Status: AC
  Administered 2014-02-23: 40 meq via ORAL
  Filled 2014-02-23: qty 2

## 2014-02-23 MED ORDER — SODIUM CHLORIDE 0.9 % IV BOLUS (SEPSIS)
1000.0000 mL | Freq: Once | INTRAVENOUS | Status: AC
  Administered 2014-02-23: 1000 mL via INTRAVENOUS

## 2014-02-23 MED ORDER — CLOTRIMAZOLE-BETAMETHASONE 1-0.05 % EX CREA
TOPICAL_CREAM | Freq: Two times a day (BID) | CUTANEOUS | Status: DC
  Administered 2014-02-23 – 2014-02-28 (×10): via TOPICAL
  Administered 2014-03-01: 1 via TOPICAL
  Administered 2014-03-01 – 2014-03-02 (×3): via TOPICAL
  Filled 2014-02-23 (×3): qty 15

## 2014-02-23 MED ORDER — PROCHLORPERAZINE MALEATE 10 MG PO TABS
10.0000 mg | ORAL_TABLET | Freq: Four times a day (QID) | ORAL | Status: DC | PRN
  Filled 2014-02-23: qty 1

## 2014-02-23 NOTE — ED Notes (Signed)
Attempted report x1. 

## 2014-02-23 NOTE — ED Provider Notes (Signed)
[facility] CSN: [id number]     Arrival date & time 02/23/14  1651 History   First MD Initiated Contact with Patient 02/23/14 1734     Chief Complaint  Patient presents with  . Wound Check  . Hypotension     (Consider location/radiation/quality/duration/timing/severity/associated sxs/prior Treatment) HPI  Thomas [name] is a 33 y.o. male with a past medical history of HIV and cancer presenting for reduced sensation to his penis and scrotum. Patient also had a rash for approximately 2 weeks to his scrotal area and buttocks. Rash is itchy. Patient has tried multiple topical agents over-the-counter without relief. Patient denies any penile discharge denies any loss of anal sensation or difficulty having bowel movements or urinating. Patient states that he is on antiviral medications for his HIV infection however he cannot remember his last CD4 count completes it was checked "a long time ago."  Patient and family member at bedside not sure what specific type of cancer he had in the past but he is on oral chemotherapy. Patient endorses feeling dizzy and lightheaded at times, especially with standing; however and, he denies any chest pain, shortness of breath, fever, chills, nausea, or vomiting.   Patient was compliant with his medication regimen, endorses stable body weight, denies night sweats or other recent signs of infection. Denies any penile discharge or other concerns related to possible sexual transmitted infection.      Past Medical History  Diagnosis Date  . HIV disease 06/25/2012  . Skin lesion 07/03/2012  . Leg pain 07/03/2012  . Sinus tachycardia 08/06/2012  . Penile cyst 08/06/2012  . Cancer    Past Surgical History  Procedure Laterality Date  . 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  this after several previous I and Ds of rectal abcess.   [name] node biopsy Right 08/08/2013    Procedure: SUPRACLAVICULAR LYMPH NODE  BIOPSY;  Surgeon: Gaye Pollack, MD;  Location: [facility];  Service: Thoracic;  Laterality: Right;  . 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 No pertinent family history. History  Substance Use Topics  . Smoking status: Former Smoker -- 1.00 packs/day for 15 years    Types: Cigarettes    Start date: 08/24/2013  . Smokeless tobacco: Never Used     Comment: quitline info, counseling  . Alcohol Use: No    Review of Systems  Constitutional: Negative.   HENT: Negative.   Eyes: Negative.   Respiratory: Negative.   Cardiovascular: Negative.   Gastrointestinal: Negative.   Endocrine: Negative.   Genitourinary: Negative.   Musculoskeletal: Negative.   Skin: Positive for rash.  Allergic/Immunologic: Negative.   Neurological: Positive for dizziness and numbness.  Hematological: Negative.   Psychiatric/Behavioral: Negative.       Allergies  Tylenol  Home Medications   Prior to Admission medications   Medication Sig Start Date End Date Taking? Authorizing Provider  calcium carbonate (TUMS - DOSED IN MG ELEMENTAL CALCIUM) 500 MG chewable tablet Chew 4 tablets (800 mg of elemental calcium total) by mouth 3 (three) times daily. 01/22/14  Yes Kelvin Cellar, MD  ciprofloxacin (CIPRO) 500 MG tablet Take 500 mg by mouth 2 (two) times daily. Patient cannot remember when he started medication 01/22/14  Yes Kelvin Cellar, MD  efavirenz-emtricitabine-tenofovir (ATRIPLA) 600-200-300 MG per tablet Take 1 tablet by mouth at bedtime.   Yes Historical Provider, MD  ferrous sulfate 325 (65 FE) MG tablet Take 1 tablet (325  mg total) by mouth 2 (two) times daily with a meal. 10/21/13  Yes [name], MD  lidocaine-prilocaine (EMLA) cream APPLY TOPICALLY TO PORT AS DIRECTED. 10/21/13  Yes Wyatt Portela, MD  metroNIDAZOLE (FLAGYL) 500 MG tablet Take 500 mg by mouth 3 (three) times daily. Patient cannot remember when he started medication 01/22/14  Yes Kelvin Cellar, MD  potassium chloride SA  (K-DUR,KLOR-CON) 20 MEQ tablet Take 1 tablet (20 mEq total) by mouth daily. 01/22/14  Yes Kelvin Cellar, MD  prochlorperazine (COMPAZINE) 10 MG tablet Take 1 tablet (10 mg total) by mouth every 6 (six) hours as needed for nausea or vomiting. 11/20/13  Yes Wyatt Portela, MD   BP 84/43  Pulse 120  Temp(Src) 98.1 F (36.7 C) (Oral)  Resp 18  SpO2 100% Physical Exam  Nursing note and vitals reviewed. Constitutional: He is oriented to person, place, and time. He appears well-developed. He appears cachectic. No distress.  HENT:  Head: Normocephalic and atraumatic.  Right Ear: External ear normal.  Left Ear: External ear normal.  Nose: Nose normal.  Mouth/Throat: Oropharynx is clear and moist. No oropharyngeal exudate.  Eyes: Conjunctivae and EOM are normal. Pupils are equal, round, and reactive to light. Right eye exhibits no discharge. Left eye exhibits no discharge. No scleral icterus.  Neck: Normal range of motion. Neck supple. No JVD present. No tracheal deviation present. No thyromegaly present.  Cardiovascular: Regular rhythm, normal heart sounds and intact distal pulses.  Tachycardia present.  Exam reveals no gallop and no friction rub.   No murmur heard. Pulmonary/Chest: Effort normal and breath sounds normal. No stridor. No respiratory distress. He has no wheezes. He has no rales. He exhibits no tenderness.  Abdominal: Soft. He exhibits no distension. There is no tenderness. There is no rebound and no guarding. Hernia confirmed negative in the right inguinal area and confirmed negative in the left inguinal area.  Genitourinary: Testes normal. Rectal exam shows tenderness. Rectal exam shows no external hemorrhoid, no internal hemorrhoid, no fissure, no mass and anal tone normal. Guaiac negative stool. Prostate is not enlarged and not tender. Right testis shows no mass, no swelling and no tenderness. Right testis is descended. Left testis shows no mass, no swelling and no tenderness. Left  testis is descended. No phimosis, paraphimosis, hypospadias, penile erythema or penile tenderness. No discharge found.  Bilateral inguinal canal tenderness, patient endorses decreased sensation to penis, and scrotum. Rash noted to scrotum and buttocks.  Musculoskeletal: Normal range of motion. He exhibits no edema and no tenderness.  Lymphadenopathy:    He has no cervical adenopathy.       Right: No inguinal adenopathy present.       Left: No inguinal adenopathy present.  Neurological: He is alert and oriented to person, place, and time.  Skin: Skin is warm and dry. No rash noted. He is not diaphoretic. No erythema. No pallor.  Excoriations to scrotum and buttocks.  Psychiatric: He has a normal mood and affect. His behavior is normal. Judgment and thought content normal.    ED Course  Procedures (including critical care time) Labs Review Labs Reviewed  COMPREHENSIVE METABOLIC PANEL - Abnormal; Notable for the following:    Sodium 132 (*)    Potassium 3.2 (*)    CO2 15 (*)    BUN 50 (*)    Creatinine, Ser 3.93 (*)    Albumin 3.2 (*)    Alkaline Phosphatase 183 (*)    GFR calc non Af Amer 19 (*)  GFR calc Af Amer 22 (*)    Anion gap 16 (*)    All other components within normal limits  URINALYSIS, ROUTINE W REFLEX MICROSCOPIC - Abnormal; Notable for the following:    APPearance CLOUDY (*)    Hgb urine dipstick MODERATE (*)    Protein, ur 100 (*)    Leukocytes, UA MODERATE (*)    All other components within normal limits  CBC WITH DIFFERENTIAL - Abnormal; Notable for the following:    RBC 2.91 (*)    Hemoglobin 9.9 (*)    HCT 28.5 (*)    RDW 16.2 (*)    Platelets 88 (*)    Neutrophils Relative % 82 (*)    Lymphocytes Relative 9 (*)    Lymphs Abs 0.6 (*)    All other components within normal limits  URINE MICROSCOPIC-ADD ON - Abnormal; Notable for the following:    Squamous Epithelial / LPF FEW (*)    All other components within normal limits  CULTURE, BLOOD (ROUTINE X 2)   CULTURE, BLOOD (ROUTINE X 2)  URINE CULTURE  GC/CHLAMYDIA PROBE AMP  RPR  SODIUM, URINE, RANDOM  UREA NITROGEN, URINE  CBC  BASIC METABOLIC PANEL  I-STAT CG4 LACTIC ACID, ED  POC OCCULT BLOOD, ED    Imaging Review Dg Chest 2 View  02/23/2014   CLINICAL DATA:  Wound check.  Hypotension  EXAM: CHEST  2 VIEW  COMPARISON:  02/23/2014  FINDINGS: Heart size and vascularity are normal. Lungs are clear without infiltrate or effusion. Port-A-Cath tip in the SVC.  IMPRESSION: No active cardiopulmonary disease.   Electronically Signed   By: Franchot Gallo M.D.   On: 02/23/2014 18:59   US Renal  02/23/2014   CLINICAL DATA:  Acute renal failure.  EXAM: RENAL/URINARY TRACT ULTRASOUND COMPLETE  COMPARISON:  Renal ultrasound January 19, 2014.  FINDINGS: Right Kidney:  Length: 11.7 cm. Moderate right hydronephrosis. 1 cm echogenic upper pole and 6 mm echogenic middle pole calculi with acoustic shadowing.  Left Kidney:  Length: 13.2 cm. 7 mm echogenic upper pole and 5 mm echogenic interpolar renal calculi with acoustic shadowing. Trace left perinephric fluid.  Bladder:  Possible debris within the bladder.  IMPRESSION: Stable right hydronephrosis.  Bilateral nephrolithiasis.  Possible debris within the urinary bladder. Consider correlation with urinary analysis.   Electronically Signed   By: Elon Alas   On: 02/23/2014 21:01   Dg Chest Port 1 View  02/23/2014   CLINICAL DATA:  Possible sepsis  EXAM: PORTABLE CHEST - 1 VIEW  COMPARISON:  01/19/2014  FINDINGS: The lungs are now clear. Negative for pneumonia or effusion. Heart size and vascularity are normal. Port-A-Cath tip in the SVC.  IMPRESSION: No active disease.   Electronically Signed   By: Franchot Gallo M.D.   On: 02/23/2014 18:17     EKG Interpretation None      MDM   Final diagnoses:  Thrombocytopenia  AKI (acute kidney injury)  HIV disease  Hodgkin's disease  Hypokalemia  Skin rash   Patient notably hypotensive upon arrival. History  concerning for possible worsening malignancy, or worsening HIV status. Patient appears cachectic, would also be concerned for a wasting syndrome related to HIV infection or AIDS. Patient's groin rash appears relatively benign, we'll treat with Benadryl initially for his pruritus. Would also be concerned for CNS mass the patient's endorsement of decreased penile and scrotal sensation, however, do note patient has normal rectal tone and perianal sensation, as well as a normal gait.  We'll give a bolus of fluids and screening labs for signs of infection or other acute processes. Will obtain chest x-ray, no other imaging studies indicated acutely. Patient has no meningismus or fever, low concern for active meningitis. Urinalysis negative for significant signs of infection. CMP shows elevated creatinine, reduced potassium, and elevated BUN. Reduced hemoglobin, however close to baseline, stool guaiac negative. Chest x-ray is unremarkable. Have discussed the patient with hospitalist service, due to concern for other underlying pathology for patient's cachexia, hypotension, and recurrent renal failure. Note similar presentation approximately a month ago with similar creatinine elevation.  Patient will be admitted for further management.  Patient care was discussed with my attending, Dr. Regenia Skeeter.     Hoyle Sauer, MD 02/24/14 [phone]

## 2014-02-23 NOTE — Progress Notes (Signed)
Pt admitted to the unit. Pt is alert and oriented. Pt oriented to room, staff, and call bell. Educated on fall safety plan. Bed in lowest position. Full assessment to Epic. Call bell with in reach. Educated to call for assists. Will continue to monitor. Welcome Fults, RN 

## 2014-02-23 NOTE — Progress Notes (Signed)
ED CM noted patient to have had a recent previous admission. Pt presented to [facility] ED with rash to groin, scrotum and buttocks area OTC remedies ineffective despite usage for over a week. Pt lives alone PCP [facility] and also followed by [facility]. Patient has Medicare and has a supportive family. Pt admitted to not having a follow-up visit after recent discharge. Discussed the importance and benefits to adhering to f/u care after discharge. Patient verbalized understanding and is agreeable. Pt made aware that a CM will continue to f/u with discharge planning.

## 2014-02-23 NOTE — H&P (Signed)
[facility] History and Physical  IBN STIEF RXV:[id number] DOB: [birth year] DOA: 02/23/2014  Referring physician: EDP PCP: Philis Fendt, MD   Chief Complaint: Rash   HPI: Thomas [name] is a 33 y.o. male who presents to ED with c/o rash on groin, scrotum, and buttocks.  Rash is itchy.  Multiple OTC agents tried w/o relief.  Has h/o HIV last CD4 in April was 360, viral load undetectable at that time.  He has hodgkins lymphoma as well which is under treatment.  Last month he was admitted and treated for septic shock secondary to diarrhea of presumed infectious origin, febrile neutropenia.  Regarding his rash he notes onset a week ago with use of a new body wash however the rash persisted despite stopping the body wash.  Today on evaluation in the ED, he is also noted to be in AKI, have low BP (although he says it is always low) which improves with IVF.  Review of Systems: No fever, chills, n/v/d, 12 systems reviewed and otherwise negative.  Systems reviewed.  As above, otherwise negative  Past Medical History  Diagnosis Date  . HIV disease 06/25/2012  . Skin lesion 07/03/2012  . Leg pain 07/03/2012  . Sinus tachycardia 08/06/2012  . Penile cyst 08/06/2012  . Cancer    Past Surgical History  Procedure Laterality Date  . 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  this after several previous I and Ds of rectal abcess.   [name] node biopsy Right 08/08/2013    Procedure: SUPRACLAVICULAR LYMPH NODE BIOPSY;  Surgeon: Gaye Pollack, MD;  Location: [facility];  Service: Thoracic;  Laterality: Right;  . 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 His smoking use included Cigarettes. He started smoking about 6 months ago. He has a 15 pack-year smoking history. He has never used smokeless tobacco. He reports that he does not drink alcohol or use illicit drugs.  Allergies  Allergen  Reactions  . Tylenol [Acetaminophen]     sweats    History reviewed. No pertinent family history.   Prior to Admission medications   Medication Sig Start Date End Date Taking? Authorizing Provider  calcium carbonate (TUMS - DOSED IN MG ELEMENTAL CALCIUM) 500 MG chewable tablet Chew 4 tablets (800 mg of elemental calcium total) by mouth 3 (three) times daily. 01/22/14  Yes Kelvin Cellar, MD  ciprofloxacin (CIPRO) 500 MG tablet Take 500 mg by mouth 2 (two) times daily. Patient cannot remember when he started medication 01/22/14  Yes Kelvin Cellar, MD  efavirenz-emtricitabine-tenofovir (ATRIPLA) 600-200-300 MG per tablet Take 1 tablet by mouth at bedtime.   Yes Historical Provider, MD  ferrous sulfate 325 (65 FE) MG tablet Take 1 tablet (325 mg total) by mouth 2 (two) times daily with a meal. 10/21/13  Yes [name], MD  lidocaine-prilocaine (EMLA) cream APPLY TOPICALLY TO PORT AS DIRECTED. 10/21/13  Yes Wyatt Portela, MD  metroNIDAZOLE (FLAGYL) 500 MG tablet Take 500 mg by mouth 3 (three) times daily. Patient cannot remember when he started medication 01/22/14  Yes Kelvin Cellar, MD  potassium chloride SA (K-DUR,KLOR-CON) 20 MEQ tablet Take 1 tablet (20 mEq total) by mouth daily. 01/22/14  Yes Kelvin Cellar, MD  prochlorperazine (COMPAZINE) 10 MG tablet Take 1 tablet (10 mg total) by mouth every 6 (six) hours as needed for nausea or vomiting. 11/20/13  Yes Wyatt Portela, MD  Physical Exam: Filed Vitals:   02/23/14 2000  BP: 95/56  Pulse: 104  Temp:   Resp: 22    BP 95/56  Pulse 104  Temp(Src) 98.1 F (36.7 C) (Oral)  Resp 22  SpO2 94%  General Appearance:    Alert, oriented, no distress, appears stated age  Head:    Normocephalic, atraumatic  Eyes:    PERRL, EOMI, sclera non-icteric        Nose:   Nares without drainage or epistaxis. Mucosa, turbinates normal  Throat:   Moist mucous membranes. Oropharynx without erythema or exudate.  Neck:   Supple. No carotid bruits.  No  thyromegaly.  No lymphadenopathy.   Back:     No CVA tenderness, no spinal tenderness  Lungs:     Clear to auscultation bilaterally, without wheezes, rhonchi or rales  Chest wall:    No tenderness to palpitation  Heart:    Regular rate and rhythm without murmurs, gallops, rubs  Abdomen:     Soft, non-tender, nondistended, normal bowel sounds, no organomegaly  Genitalia:    deferred  Rectal:    deferred  Extremities:   No clubbing, cyanosis or edema.  Pulses:   2+ and symmetric all extremities  Skin:   Excoriations on groin and buttocks.  Lymph nodes:   Cervical, supraclavicular, and axillary nodes normal  Neurologic:   CNII-XII intact. Normal strength, sensation and reflexes      throughout    Labs on Admission:  Basic Metabolic Panel:  Recent Labs Lab 02/23/14 1722  NA 132*  K 3.2*  CL 101  CO2 15*  GLUCOSE 90  BUN 50*  CREATININE 3.93*  CALCIUM 8.4   Liver Function Tests:  Recent Labs Lab 02/23/14 1722  AST 18  ALT 25  ALKPHOS 183*  BILITOT 0.3  PROT 7.9  ALBUMIN 3.2*   No results found for this basename: LIPASE, AMYLASE,  in the last 168 hours No results found for this basename: AMMONIA,  in the last 168 hours CBC:  Recent Labs Lab 02/23/14 1722  WBC 6.3  NEUTROABS 5.1  HGB 9.9*  HCT 28.5*  MCV 97.9  PLT 88*   Cardiac Enzymes: No results found for this basename: CKTOTAL, CKMB, CKMBINDEX, TROPONINI,  in the last 168 hours  BNP (last 3 results) No results found for this basename: PROBNP,  in the last 8760 hours CBG: No results found for this basename: GLUCAP,  in the last 168 hours  Radiological Exams on Admission: Dg Chest 2 View  02/23/2014   CLINICAL DATA:  Wound check.  Hypotension  EXAM: CHEST  2 VIEW  COMPARISON:  02/23/2014  FINDINGS: Heart size and vascularity are normal. Lungs are clear without infiltrate or effusion. Port-A-Cath tip in the SVC.  IMPRESSION: No active cardiopulmonary disease.   Electronically Signed   By: Franchot Gallo M.D.    On: 02/23/2014 18:59   Dg Chest Port 1 View  02/23/2014   CLINICAL DATA:  Possible sepsis  EXAM: PORTABLE CHEST - 1 VIEW  COMPARISON:  01/19/2014  FINDINGS: The lungs are now clear. Negative for pneumonia or effusion. Heart size and vascularity are normal. Port-A-Cath tip in the SVC.  IMPRESSION: No active disease.   Electronically Signed   By: Franchot Gallo M.D.   On: 02/23/2014 18:17    EKG: Independently reviewed.  Assessment/Plan Principal Problem:   AKI (acute kidney injury) Active Problems:   HIV disease   Hodgkin's disease   Hypokalemia   Thrombocytopenia  Skin rash   1. AKI - urine lytes, and renal ultrasound ordered, hydrating patient although patient does not appear toxic or to be in shock.  Unclear if this is a pre-renal or renal disease causing this: 1. HIVAN is unlikely given an undetectable viral load. 2. Regarding the chemo agents: doxorubicin, vinblastine, bleomycin, and dacarbazine, none of these are particularly nephrotoxic although Vinblastine can cause SIADH, bleomycin can be retained in renal failure (and the increased levels could also explain his skin rash development which occurs in 50% of patients on this medication) 2. Hypotension - apparently a chronic component to this as well per patient, patient appears non-toxic, no SIRS criteria to suggest infection, had cortisol level last month which was normal so no reason to believe he has addison's.  Giving IVF as he did respond somewhat to IVF in ED. 3. Skin rash - puritic skin rash, ? Reaction to chemo (bleomycin), does not at all appear vasculitic and dosent really appear like candida.  Will empirically treat with Lotrisone cream. 4. HIV - continue Atripla, last CD4 = 360, last viral load undetectable 5. Hodgkin's disease - undergoing chemotherapy, next chemo due on Friday. 6. Thrombocytopenia - appears chronic or chemotherapy related, monitor while here, SCDs for DVT ppx. 7. Hypokalemia - replace potassium  PO.   Code Status: Full Code  Family Communication: Mother at bedside Disposition Plan: Admit to inpatient   Time spent: 70 min  Raffi Milstein M. [facility] Pager [phone]  If 7AM-7PM, please contact the day team taking care of the patient [email or web address] Password [email or web address] 02/23/2014, 8:48 PM

## 2014-02-23 NOTE — ED Notes (Addendum)
Pt c/o wounds to bottom of legs and buttocks; pt noted to be tachycardic and hypotensive; pt sts sores on penis; pt noted to have sores in mouth

## 2014-02-23 NOTE — ED Notes (Signed)
Pt currently in US.

## 2014-02-24 ENCOUNTER — Encounter (HOSPITAL_COMMUNITY): Payer: Self-pay | Admitting: *Deleted

## 2014-02-24 ENCOUNTER — Other Ambulatory Visit: Payer: Self-pay | Admitting: *Deleted

## 2014-02-24 LAB — URIC ACID: URIC ACID, SERUM: 6.1 mg/dL (ref 4.0–7.8)

## 2014-02-24 LAB — CBC
HEMATOCRIT: 22.7 % — AB (ref 39.0–52.0)
HEMOGLOBIN: 7.8 g/dL — AB (ref 13.0–17.0)
MCH: 32.9 pg (ref 26.0–34.0)
MCHC: 34.4 g/dL (ref 30.0–36.0)
MCV: 95.8 fL (ref 78.0–100.0)
Platelets: 94 10*3/uL — ABNORMAL LOW (ref 150–400)
RBC: 2.37 MIL/uL — AB (ref 4.22–5.81)
RDW: 16.3 % — ABNORMAL HIGH (ref 11.5–15.5)
WBC: 4.9 10*3/uL (ref 4.0–10.5)

## 2014-02-24 LAB — GC/CHLAMYDIA PROBE AMP
CT PROBE, AMP APTIMA: NEGATIVE
GC Probe RNA: NEGATIVE

## 2014-02-24 LAB — BASIC METABOLIC PANEL
Anion gap: 14 (ref 5–15)
Anion gap: 14 (ref 5–15)
BUN: 43 mg/dL — AB (ref 6–23)
BUN: 30 mg/dL — AB (ref 6–23)
CHLORIDE: 105 meq/L (ref 96–112)
CO2: 16 meq/L — AB (ref 19–32)
CO2: 15 meq/L — AB (ref 19–32)
CREATININE: 2.61 mg/dL — AB (ref 0.50–1.35)
Calcium: 7.9 mg/dL — ABNORMAL LOW (ref 8.4–10.5)
Calcium: 7.9 mg/dL — ABNORMAL LOW (ref 8.4–10.5)
Chloride: 107 mEq/L (ref 96–112)
Creatinine, Ser: 3.24 mg/dL — ABNORMAL HIGH (ref 0.50–1.35)
GFR calc Af Amer: 28 mL/min — ABNORMAL LOW (ref >90)
GFR calc non Af Amer: 24 mL/min — ABNORMAL LOW (ref >90)
GFR calc non Af Amer: 31 mL/min — ABNORMAL LOW (ref >90)
GFR, EST AFRICAN AMERICAN: 36 mL/min — AB (ref >90)
GLUCOSE: 90 mg/dL (ref 70–99)
Glucose, Bld: 96 mg/dL (ref 70–99)
POTASSIUM: 3.3 meq/L — AB (ref 3.7–5.3)
Potassium: 3.3 mEq/L — ABNORMAL LOW (ref 3.7–5.3)
SODIUM: 135 meq/L — AB (ref 137–147)
Sodium: 136 mEq/L — ABNORMAL LOW (ref 137–147)

## 2014-02-24 LAB — UREA NITROGEN, URINE: Urea Nitrogen, Ur: 487 mg/dL

## 2014-02-24 LAB — CK: CK TOTAL: 74 U/L (ref 7–232)

## 2014-02-24 MED ORDER — EMTRICITABINE-TENOFOVIR DF 200-300 MG PO TABS
1.0000 | ORAL_TABLET | ORAL | Status: DC
  Filled 2014-02-24: qty 1

## 2014-02-24 MED ORDER — VANCOMYCIN HCL IN DEXTROSE 1-5 GM/200ML-% IV SOLN
1000.0000 mg | INTRAVENOUS | Status: DC
  Administered 2014-02-24 – 2014-02-25 (×2): 1000 mg via INTRAVENOUS
  Filled 2014-02-24 (×2): qty 200

## 2014-02-24 MED ORDER — SODIUM CHLORIDE 0.9 % IV SOLN: INTRAVENOUS | Status: DC

## 2014-02-24 MED ORDER — POTASSIUM CHLORIDE CRYS ER 20 MEQ PO TBCR
40.0000 meq | EXTENDED_RELEASE_TABLET | Freq: Once | ORAL | Status: AC
  Administered 2014-02-24: 40 meq via ORAL
  Filled 2014-02-24: qty 2

## 2014-02-24 MED ORDER — ENSURE COMPLETE PO LIQD
237.0000 mL | Freq: Three times a day (TID) | ORAL | Status: DC
  Administered 2014-02-24 – 2014-03-03 (×17): 237 mL via ORAL

## 2014-02-24 MED ORDER — EFAVIRENZ 600 MG PO TABS
600.0000 mg | ORAL_TABLET | Freq: Every day | ORAL | Status: DC
  Administered 2014-02-24: 600 mg via ORAL
  Filled 2014-02-24 (×2): qty 1

## 2014-02-24 NOTE — Progress Notes (Signed)
Note/chart reviewed. Agree with note.   Nimco Bivens RD, LDN, CNSC [phone] Pager [phone] After Hours Pager   

## 2014-02-24 NOTE — ED Provider Notes (Signed)
I saw and evaluated the patient, reviewed the resident's note and I agree with the findings and plan.   EKG Interpretation None       Patient appears chronically ill, likely from his HIV. At this point he has evidence of acute kidney failure, likely contributing to his feeling of weakness. His rash in his groin is nonspecific but does not appear to be in pain disease would be causing sepsis. His blood pressures are low in the 80s but appears to be his normal baseline patient report.  Ephraim Hamburger, MD 02/24/14 [phone]

## 2014-02-24 NOTE — Progress Notes (Signed)
2 of 2 positive blood cultures with gram + cocci in clusters.  Started Vanc per pharm.  Consider ID consult in am given history of HIV.  Imogene Burn, PA-C [facility] Pager: [phone]

## 2014-02-24 NOTE — Progress Notes (Signed)
Utilization review completed.  

## 2014-02-24 NOTE — Progress Notes (Signed)
Addendum  1/2 blood cultures positive for Gr + cocci in clusters. Not toxic looking, asymptomatic, no fever or Leukocytosis. Likely contaminant- hold off abx and monitor closely. If develops fever or other changes, re culture and consider Abx. FU final results.  Vernell Leep, MD, FACP, FHM. [facility] Pager [phone]  If 7PM-7AM, please contact night-coverage [email or web address] Password [email or web address] 02/24/2014, 3:26 PM

## 2014-02-24 NOTE — Progress Notes (Signed)
PROGRESS NOTE    Thomas [name] DOB: [birth year] DOA: 02/23/2014 PCP: Philis Fendt, MD  HPI/Brief narrative 33 y/o male patient with h/o HIV (follows with Dr. Linus Salmons), Hodgkin's Lymphoma on chemo - last 2 weeks ago(follows with Dr. Alen Blew), admitted on 8/3 with worsening pruritis of left side groin and thigh rash that he had for 5-7 days PTA and was not getting better with home remedies including streoid cream. Last CD4 in April was 360, viral load undetectable at that time. In the ED, noted to be in AKI, have low BP (although he says it is always low) which improved with IVF.   Assessment/Plan:  1. Acute Renal Failure: unclear etiology. States no GI symptoms and appetite good. Denies NSAID's. ? ATN from hypotension. Renal US shows stable right hydronephrosis. Check Uric acid and CK. Chemo meds- less likely etiology. Improving with IVF- continue and follow up BMP. 2. Hypotension: apparently chronic and asymptomatic. 3. Skin rash: over left side of groin, extending to post right thigh to post knee and left hip. Drying up. Unclear etiology- improving. 4. HIV: continue home medications adjusted to renal function by pharmacy. OP follow up with ID. 5. Hodgkin's disease: next chemo due on 02/27/14. Defer Mx to OP Oncologist. 6. Hypokalemia: replace and follow. 7. Chronic Anemia: Likely 2/2 to chronic disease, HIV, lymphoma and Rx. No reported bleeding. Transfuse if Hb < 7. 8. Thrombocytopenia: chronic and stable. 9. Severe malnutrition in the context of chronic illness & Underweight: Nutrition consult appreciated- Mx per them.     Code Status: Full Family Communication: None at bedside. Disposition Plan: DC home when medically stable.   Consultants:  None  Procedures:  None  Antibiotics:  None   Subjective: Pruritis improved. States rash has almost resolved.  Objective: Filed Vitals:   02/23/14 2309 02/24/14 0537 02/24/14 0905 02/24/14 0949  BP: 84/49 93/59  98/61 91/53  Pulse: 102 92 119 97  Temp: 98.1 F (36.7 C) 98.1 F (36.7 C) 98.9 F (37.2 C) 98.9 F (37.2 C)  TempSrc:   Oral Oral  Resp: 18 17 18 18   Weight: 60.9 kg (134 lb 4.2 oz)     SpO2: 100% 99% 99% 100%    Intake/Output Summary (Last 24 hours) at 02/24/14 1458 Last data filed at 02/24/14 2355  Gross per 24 hour  Intake      0 ml  Output    700 ml  Net   -700 ml   Filed Weights   02/23/14 2309  Weight: 60.9 kg (134 lb 4.2 oz)     Exam:  General exam: Pleasant young male lying comfortably in bed. Does not look toxic or septic. Respiratory system: Clear. No increased work of breathing. Cardiovascular system: S1 & S2 heard, RRR. No JVD, murmurs, gallops, clicks or pedal edema. Gastrointestinal system: Abdomen is nondistended, soft and nontender. Normal bowel sounds heard. Central nervous system: Alert and oriented. No focal neurological deficits. Extremities: Symmetric 5 x 5 power. Skin: Excoriated drying skin rash over left groin, extending to left posterior thing and hip.   Data Reviewed: Basic Metabolic Panel:  Recent Labs Lab 02/23/14 1722 02/24/14 0340  NA 132* 135*  K 3.2* 3.3*  CL 101 105  CO2 15* 16*  GLUCOSE 90 90  BUN 50* 43*  CREATININE 3.93* 3.24*  CALCIUM 8.4 7.9*   Liver Function Tests:  Recent Labs Lab 02/23/14 1722  AST 18  ALT 25  ALKPHOS 183*  BILITOT 0.3  PROT 7.9  ALBUMIN  3.2*   No results found for this basename: LIPASE, AMYLASE,  in the last 168 hours No results found for this basename: AMMONIA,  in the last 168 hours CBC:  Recent Labs Lab 02/23/14 1722 02/24/14 0340  WBC 6.3 4.9  NEUTROABS 5.1  --   HGB 9.9* 7.8*  HCT 28.5* 22.7*  MCV 97.9 95.8  PLT 88* 94*   Cardiac Enzymes: No results found for this basename: CKTOTAL, CKMB, CKMBINDEX, TROPONINI,  in the last 168 hours BNP (last 3 results) No results found for this basename: PROBNP,  in the last 8760 hours CBG: No results found for this basename: GLUCAP,   in the last 168 hours  Recent Results (from the past 240 hour(s))  CULTURE, BLOOD (ROUTINE X 2)     Status: None   Collection Time    02/23/14  6:08 PM      Result Value Ref Range Status   Specimen Description BLOOD RIGHT ARM   Final   Special Requests BOTTLES DRAWN AEROBIC AND ANAEROBIC 10CC   Final   Culture  Setup Time     Final   Value: 02/23/2014 22:16     Performed at [facility]   Culture     Final   Value:        BLOOD CULTURE RECEIVED NO GROWTH TO DATE CULTURE WILL BE HELD FOR 5 DAYS BEFORE ISSUING A FINAL NEGATIVE REPORT     Performed at [facility]   Report Status PENDING   Incomplete  CULTURE, BLOOD (ROUTINE X 2)     Status: None   Collection Time    02/23/14  6:30 PM      Result Value Ref Range Status   Specimen Description BLOOD LEFT ARM   Final   Special Requests BOTTLES DRAWN AEROBIC AND ANAEROBIC 10CC   Final   Culture  Setup Time     Final   Value: 02/23/2014 22:16     Performed at [facility]   Culture     Final   Value:        BLOOD CULTURE RECEIVED NO GROWTH TO DATE CULTURE WILL BE HELD FOR 5 DAYS BEFORE ISSUING A FINAL NEGATIVE REPORT     Performed at [facility]   Report Status PENDING   Incomplete  GC/CHLAMYDIA PROBE AMP     Status: None   Collection Time    02/23/14  9:46 PM      Result Value Ref Range Status   CT Probe RNA NEGATIVE  NEGATIVE Final   GC Probe RNA NEGATIVE  NEGATIVE Final   Comment: (NOTE)                                                                                               **Normal Reference Range: Negative**          Assay performed using the Gen-Probe APTIMA COMBO2 (R) Assay.     Acceptable specimen types for this assay include APTIMA Swabs (Unisex,     endocervical, urethral, or vaginal), first void urine, and ThinPrep     liquid based cytology  samples.     Performed at [facility]       Studies: Dg Chest 2 View  02/23/2014   CLINICAL DATA:  Wound check.  Hypotension   EXAM: CHEST  2 VIEW  COMPARISON:  02/23/2014  FINDINGS: Heart size and vascularity are normal. Lungs are clear without infiltrate or effusion. Port-A-Cath tip in the SVC.  IMPRESSION: No active cardiopulmonary disease.   Electronically Signed   By: Franchot Gallo M.D.   On: 02/23/2014 18:59   US Renal  02/23/2014   CLINICAL DATA:  Acute renal failure.  EXAM: RENAL/URINARY TRACT ULTRASOUND COMPLETE  COMPARISON:  Renal ultrasound January 19, 2014.  FINDINGS: Right Kidney:  Length: 11.7 cm. Moderate right hydronephrosis. 1 cm echogenic upper pole and 6 mm echogenic middle pole calculi with acoustic shadowing.  Left Kidney:  Length: 13.2 cm. 7 mm echogenic upper pole and 5 mm echogenic interpolar renal calculi with acoustic shadowing. Trace left perinephric fluid.  Bladder:  Possible debris within the bladder.  IMPRESSION: Stable right hydronephrosis.  Bilateral nephrolithiasis.  Possible debris within the urinary bladder. Consider correlation with urinary analysis.   Electronically Signed   By: Elon Alas   On: 02/23/2014 21:01   Dg Chest Port 1 View  02/23/2014   CLINICAL DATA:  Possible sepsis  EXAM: PORTABLE CHEST - 1 VIEW  COMPARISON:  01/19/2014  FINDINGS: The lungs are now clear. Negative for pneumonia or effusion. Heart size and vascularity are normal. Port-A-Cath tip in the SVC.  IMPRESSION: No active disease.   Electronically Signed   By: Franchot Gallo M.D.   On: 02/23/2014 18:17        Scheduled Meds: . calcium carbonate  800 mg of elemental calcium Oral TID  . clotrimazole-betamethasone   Topical BID  . efavirenz  600 mg Oral QHS  . [START ON 02/25/2014] emtricitabine-tenofovir  1 tablet Oral Q48H  . feeding supplement (ENSURE COMPLETE)  237 mL Oral TID BM  . ferrous sulfate  325 mg Oral BID WC   Continuous Infusions: . sodium chloride 125 mL/hr at 02/24/14 6415    Principal Problem:   AKI (acute kidney injury) Active Problems:   HIV disease   Hodgkin's disease   Hypokalemia    Thrombocytopenia   Skin rash    Time spent: 40 minutes.    Vernell Leep, MD, FACP, FHM. [facility] Pager [phone]  If 7PM-7AM, please contact night-coverage [email or web address] Password [email or web address] 02/24/2014, 2:58 PM    LOS: 1 day

## 2014-02-24 NOTE — Progress Notes (Signed)
ANTIBIOTIC CONSULT NOTE - INITIAL  Pharmacy Consult for Vancomycin  Indication: Bacteremia (2/2 gram + cocci in clusters)  Patient Measurements: Weight: 134 lb 4.2 oz (60.9 kg)  Vital Signs: Temp: 98.9 F (37.2 C) (08/04 2154) BP: 83/43 mmHg (08/04 2154) Pulse Rate: 94 (08/04 2154)  Labs:  Recent Labs  02/23/14 1722 02/24/14 0340 02/24/14 1715  WBC 6.3 4.9  --   HGB 9.9* 7.8*  --   PLT 88* 94*  --   CREATININE 3.93* 3.24* 2.61*    Medical History: Past Medical History  Diagnosis Date  . HIV disease 06/25/2012  . Skin lesion 07/03/2012  . Leg pain 07/03/2012  . Sinus tachycardia 08/06/2012  . Penile cyst 08/06/2012  . Cancer    Assessment: 33 y/o M with positive blood cultures (2/2 gram + cocci in clusters). WBC wnl (noted HIV hx), afebrile, in acute renal failure with CrCl ~ 30-40,   Goal of Therapy:  Vancomycin trough level 15-20 mcg/ml  Plan:  -Vancomycin 1000 mg IV q24h, increase dose if renal function improves 8/5 -Trend WBC, temp, renal function  -Drug levels as indicated   Narda Bonds 02/24/2014,11:07 PM

## 2014-02-24 NOTE — Progress Notes (Signed)
INITIAL NUTRITION ASSESSMENT  Pt meets criteria for SEVERE MALNUTRITION in the context of chronic illness as evidenced by a 10% weight loss in one month, energy intake of </= 75% for >/= 1 month, and severe fat and muscle mass depletion.  DOCUMENTATION CODES Per approved criteria  -Severe malnutrition in the context of chronic illness -Underweight   INTERVENTION: Provide Ensure Complete po TID between meals, each supplement provides 350 kcal and 13 grams of protein.  NUTRITION DIAGNOSIS: Malnutrition related to chronic illness as evidenced by 10% weight loss in one month, energy intake of </= 75% for >/= 1 month, and severe fat and muscle mass depletion.   Goal: Pt to meet >/= 90% of their estimated nutrition needs.  Monitor:  PO intake, weight trends, labs, I/O's  Reason for Assessment: MST and low braden  33 y.o. male  Admitting Dx: AKI (acute kidney injury)  ASSESSMENT: Pt with PMH of HIV and hodgkins lymphoma undergoing treatment. Pt presented with itchy rash on groin, scrotum, and buttocks. While evaluated in the ED, pt was noted to be in acute kidney injury.  Spoke with pt. Pt reports his appetite currently is good. He did not eat breakfast this morning because he reports he usually does not eat this early and was not hungry. Pt reports however that he will be hungry during lunch time.  Usual Diet Recall: Breakfast: does not eat Lunch: Some kind of sandwich Dinner: Mom cooks him dinner (meat, vegetables, starch) He also drinks Ensure three times a day.  Pt does report that over the past month that he has noticed he has been eating less than usual. His appetite has decreased a bit. Pt contributes that cause to his start of chemotherapy, which he started on a couple of months ago. Pt reports he does not have any significant side effects with the therapy. He denies any nausea or vomiting. Pt reports he is unsure of any weight loss. Pt reports his usual body weight of 143 lbs,  where he says he last weighed 2 weeks ago. Per [facility] records, pt has had a 10% weight loss in one month.  Pt requests he would like Ensure ordered while hospitalized. Will order.Pt was educated to continue to drink his Ensures to help him intake more calories and protein to prevent further weight loss. Pt expressed understanding.  Nutrition Focused Physical Exam:  Subcutaneous Fat:  Orbital Region: WNL Upper Arm Region: Severe depletion Thoracic and Lumbar Region: Moderate depletion  Muscle:  Temple Region: WNL Clavicle Bone Region: Severe depletion Clavicle and Acromion Bone Region: Severe depletion Scapular Bone Region: N/A Dorsal Hand: WNL Patellar Region: Severe depletion Anterior Thigh Region: Severe depletion Posterior Calf Region: Severe depletion  Edema: none  Labs: Low sodium, potassium, CO2, calcium, and GFR.. High BUN and creatinine.  Height: Ht Readings from Last 1 Encounters:  02/13/14 6\' 1"  (1.854 m)    Weight: Wt Readings from Last 1 Encounters:  02/23/14 134 lb 4.2 oz (60.9 kg)    Ideal Body Weight: 184 lbs  % Ideal Body Weight: 73%  Wt Readings from Last 10 Encounters:  02/23/14 134 lb 4.2 oz (60.9 kg)  02/13/14 143 lb 8 oz (65.091 kg)  01/29/14 149 lb (67.586 kg)  01/22/14 419 lb 3.2 oz (190.148 kg)  01/16/14 135 lb 14.4 oz (61.644 kg)  01/02/14 138 lb 9.6 oz (62.869 kg)  12/19/13 133 lb 14.4 oz (60.737 kg)  12/05/13 129 lb 3.2 oz (58.605 kg)  11/20/13 124 lb (56.246 kg)  10/31/13 120 lb 11.2 oz (54.749 kg)    Usual Body Weight: 143 lbs  % Usual Body Weight: 94%  BMI:  Body mass index is 17.72 kg/(m^2). Underweight  Estimated Nutritional Needs: Kcal: 2100-2300 Protein: 100-110 grams Fluid: 2.1 L - 2.3 L/day  Skin: Rash on groin, scrotum, and buttocks  Diet Order: General  EDUCATION NEEDS: -Education needs addressed   Intake/Output Summary (Last 24 hours) at 02/24/14 0846 Last data filed at 02/24/14 0707  Gross per 24 hour   Intake      0 ml  Output    700 ml  Net   -700 ml    Last BM: 8/4   Labs:   Recent Labs Lab 02/23/14 1722 02/24/14 0340  NA 132* 135*  K 3.2* 3.3*  CL 101 105  CO2 15* 16*  BUN 50* 43*  CREATININE 3.93* 3.24*  CALCIUM 8.4 7.9*  GLUCOSE 90 90    CBG (last 3)  No results found for this basename: GLUCAP,  in the last 72 hours  Scheduled Meds: . calcium carbonate  800 mg of elemental calcium Oral TID  . clotrimazole-betamethasone   Topical BID  . efavirenz-emtricitabine-tenofovir  1 tablet Oral QHS  . ferrous sulfate  325 mg Oral BID WC    Continuous Infusions: . sodium chloride 125 mL/hr at 02/24/14 2111    Past Medical History  Diagnosis Date  . HIV disease 06/25/2012  . Skin lesion 07/03/2012  . Leg pain 07/03/2012  . Sinus tachycardia 08/06/2012  . Penile cyst 08/06/2012  . Cancer     Past Surgical History  Procedure Laterality Date  . 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  this after several previous I and Ds of rectal abcess.   [name] node biopsy Right 08/08/2013    Procedure: SUPRACLAVICULAR LYMPH NODE BIOPSY;  Surgeon: Gaye Pollack, MD;  Location: [facility];  Service: Thoracic;  Laterality: Right;  . 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

## 2014-02-24 NOTE — Significant Event (Signed)
CRITICAL VALUE ALERT  Critical value received: POSITIVE BLOOD CX'S WITH GRAM POSITIVE COCCI IN CLUSTERS  Date of notification:  02/24/14  Time of notification:  3276  Critical value read back:Yes.    Nurse who received alert:  Dudley Major  MD notified (1st page): [place]. NP  Time of first page:  1049  MD notified (2nd page):  Time of second page:  Responding MD:    Time MD responded:    Jobe Igo, RN

## 2014-02-25 DIAGNOSIS — B029 Zoster without complications: Secondary | ICD-10-CM

## 2014-02-25 DIAGNOSIS — A4901 Methicillin susceptible Staphylococcus aureus infection, unspecified site: Secondary | ICD-10-CM

## 2014-02-25 DIAGNOSIS — B9561 Methicillin susceptible Staphylococcus aureus infection as the cause of diseases classified elsewhere: Secondary | ICD-10-CM

## 2014-02-25 DIAGNOSIS — R7881 Bacteremia: Secondary | ICD-10-CM

## 2014-02-25 DIAGNOSIS — E872 Acidosis, unspecified: Secondary | ICD-10-CM

## 2014-02-25 DIAGNOSIS — I959 Hypotension, unspecified: Secondary | ICD-10-CM

## 2014-02-25 HISTORY — DX: Methicillin susceptible Staphylococcus aureus infection as the cause of diseases classified elsewhere: R78.81

## 2014-02-25 HISTORY — DX: Methicillin susceptible Staphylococcus aureus infection as the cause of diseases classified elsewhere: B95.61

## 2014-02-25 HISTORY — DX: Zoster without complications: B02.9

## 2014-02-25 LAB — CBC
HEMATOCRIT: 23.2 % — AB (ref 39.0–52.0)
HEMOGLOBIN: 7.8 g/dL — AB (ref 13.0–17.0)
MCH: 33.6 pg (ref 26.0–34.0)
MCHC: 33.6 g/dL (ref 30.0–36.0)
MCV: 100 fL (ref 78.0–100.0)
Platelets: 92 10*3/uL — ABNORMAL LOW (ref 150–400)
RBC: 2.32 MIL/uL — ABNORMAL LOW (ref 4.22–5.81)
RDW: 16.5 % — ABNORMAL HIGH (ref 11.5–15.5)
WBC: 4.5 10*3/uL (ref 4.0–10.5)

## 2014-02-25 LAB — URINE CULTURE
Colony Count: NO GROWTH
Culture: NO GROWTH

## 2014-02-25 LAB — BASIC METABOLIC PANEL
Anion gap: 12 (ref 5–15)
BUN: 22 mg/dL (ref 6–23)
CALCIUM: 8 mg/dL — AB (ref 8.4–10.5)
CO2: 15 meq/L — AB (ref 19–32)
CREATININE: 2.23 mg/dL — AB (ref 0.50–1.35)
Chloride: 113 mEq/L — ABNORMAL HIGH (ref 96–112)
GFR calc Af Amer: 44 mL/min — ABNORMAL LOW (ref >90)
GFR calc non Af Amer: 38 mL/min — ABNORMAL LOW (ref >90)
Glucose, Bld: 92 mg/dL (ref 70–99)
Potassium: 3.4 mEq/L — ABNORMAL LOW (ref 3.7–5.3)
Sodium: 140 mEq/L (ref 137–147)

## 2014-02-25 MED ORDER — CEFAZOLIN SODIUM-DEXTROSE 2-3 GM-% IV SOLR
2.0000 g | Freq: Three times a day (TID) | INTRAVENOUS | Status: DC
  Administered 2014-02-25 – 2014-03-03 (×18): 2 g via INTRAVENOUS
  Filled 2014-02-25 (×22): qty 50

## 2014-02-25 MED ORDER — SODIUM BICARBONATE 8.4 % IV SOLN
INTRAVENOUS | Status: AC
  Administered 2014-02-25 (×2): via INTRAVENOUS
  Filled 2014-02-25 (×3): qty 150

## 2014-02-25 MED ORDER — POTASSIUM CHLORIDE CRYS ER 20 MEQ PO TBCR
40.0000 meq | EXTENDED_RELEASE_TABLET | Freq: Once | ORAL | Status: AC
  Administered 2014-02-25: 40 meq via ORAL
  Filled 2014-02-25: qty 2

## 2014-02-25 NOTE — Progress Notes (Signed)
PROGRESS NOTE    Thomas [name] OBS:[id number] DOB: [birth year] DOA: 02/23/2014 PCP: Philis Fendt, MD  HPI/Brief narrative 33 y/o male patient with h/o HIV (follows with Dr. Linus Salmons), Hodgkin's Lymphoma on chemo - last 2 weeks ago(follows with Dr. Alen Blew), admitted on 8/3 with worsening pruritis of left side groin and thigh rash that he had for 5-7 days PTA and was not getting better with home remedies including streoid cream. Last CD4 in April was 360, viral load undetectable at that time. In the ED, noted to be in AKI, have low BP (although he says it is always low) which improved with IVF.   Assessment/Plan:  1. Acute Renal Failure: unclear etiology. States no GI symptoms and appetite good. Denies NSAID's. ? ATN from hypotension. Renal US shows stable right hydronephrosis. Uric acid and CK normal. Chemo meds- less likely etiology. Improving with IVF- continue and follow up BMP. 2. Non AG metabolic acidosis: Unclear etiology. Has had this on reviewing past labs earlier this year.? Secondary to HIV medications. IV bicarbonate infusion. Discussed with Dr. Audie Box to hold some of his HIV medications. 3. Staphylococcus aureus bacteremia: 2 of 2 blood cultures positive. Source-? From infected shingles versus hemodialysis catheter. Infectious disease consulted. Continue vancomycin. Adding Ancef for MSSA coverage. 2-D echo. Followup surveillance cultures. Even though his bacteremia, do not believe that his initial presentation was consistent with sepsis- he has chronic asymptomatic hypotension. 4. Hypotension: apparently chronic and asymptomatic. 5. Shingles/Skin rash: over left side of groin, thigh and buttock. Evaluated by ID and consistent with shingles involving S1 dermatome-all lesions mostly dried up-no isolation or treatment recommended at this time. 6. HIV: continue home medications adjusted to renal function by pharmacy. Management per ID. 7. Hodgkin's disease: next chemo due on  02/27/14. Defer Mx to OP Oncologist. 8. Hypokalemia: replace and follow. 9. Chronic Anemia: Likely 2/2 to chronic disease, HIV, lymphoma and Rx. No reported bleeding. Transfuse if Hb < 7. 10. Thrombocytopenia: chronic and stable. 11. Severe malnutrition in the context of chronic illness & Underweight: Nutrition consult appreciated- Mx per them.     Code Status: Full Family Communication: None at bedside. Disposition Plan: DC home when medically stable.   Consultants:  Infectious disease  Procedures:  None  Antibiotics:  Vancomycin  Ancef  Subjective: Pruritis improved. States rash has almost resolved.  Objective: Filed Vitals:   02/24/14 0949 02/24/14 2154 02/25/14 0547 02/25/14 1058  BP: 91/53 83/43 82/44  81/49  Pulse: 97 94 85 96  Temp: 98.9 F (37.2 C) 98.9 F (37.2 C) 98.8 F (37.1 C) 97.8 F (36.6 C)  TempSrc: Oral   Oral  Resp: 18 18 17 18   Weight:      SpO2: 100% 100% 98% 99%    Intake/Output Summary (Last 24 hours) at 02/25/14 1121 Last data filed at 02/25/14 1004  Gross per 24 hour  Intake      0 ml  Output   3700 ml  Net  -3700 ml   Filed Weights   02/23/14 2309  Weight: 60.9 kg (134 lb 4.2 oz)     Exam:  General exam: Pleasant young male lying comfortably in bed. Does not look toxic or septic. Respiratory system: Clear. No increased work of breathing. Cardiovascular system: S1 & S2 heard, RRR. No JVD, murmurs, gallops, clicks or pedal edema. Gastrointestinal system: Abdomen is nondistended, soft and nontender. Normal bowel sounds heard. Central nervous system: Alert and oriented. No focal neurological deficits. Extremities: Symmetric 5 x 5 power. Skin: Excoriated  drying skin rash over left groin, extending to left posterior thing, left buttock and hip.   Data Reviewed: Basic Metabolic Panel:  Recent Labs Lab 02/23/14 1722 02/24/14 0340 02/24/14 1715 02/25/14 0540  NA 132* 135* 136* 140  K 3.2* 3.3* 3.3* 3.4*  CL 101 105 107  113*  CO2 15* 16* 15* 15*  GLUCOSE 90 90 96 92  BUN 50* 43* 30* 22  CREATININE 3.93* 3.24* 2.61* 2.23*  CALCIUM 8.4 7.9* 7.9* 8.0*   Liver Function Tests:  Recent Labs Lab 02/23/14 1722  AST 18  ALT 25  ALKPHOS 183*  BILITOT 0.3  PROT 7.9  ALBUMIN 3.2*   No results found for this basename: LIPASE, AMYLASE,  in the last 168 hours No results found for this basename: AMMONIA,  in the last 168 hours CBC:  Recent Labs Lab 02/23/14 1722 02/24/14 0340 02/25/14 0540  WBC 6.3 4.9 4.5  NEUTROABS 5.1  --   --   HGB 9.9* 7.8* 7.8*  HCT 28.5* 22.7* 23.2*  MCV 97.9 95.8 100.0  PLT 88* 94* 92*   Cardiac Enzymes:  Recent Labs Lab 02/24/14 1715  CKTOTAL 74   BNP (last 3 results) No results found for this basename: PROBNP,  in the last 8760 hours CBG: No results found for this basename: GLUCAP,  in the last 168 hours  Recent Results (from the past 240 hour(s))  CULTURE, BLOOD (ROUTINE X 2)     Status: None   Collection Time    02/23/14  6:08 PM      Result Value Ref Range Status   Specimen Description BLOOD RIGHT ARM   Final   Special Requests BOTTLES DRAWN AEROBIC AND ANAEROBIC 10CC   Final   Culture  Setup Time     Final   Value: 02/23/2014 22:16     Performed at [facility]   Culture     Final   Value: STAPHYLOCOCCUS AUREUS     Note: RIFAMPIN AND GENTAMICIN SHOULD NOT BE USED AS SINGLE DRUGS FOR TREATMENT OF STAPH INFECTIONS.     Note: Gram Stain Report Called to,Read Back By and Verified With: ANDY BRAKE@3 :00PM ON 02/24/14 BY DANTS     Performed at [facility]   Report Status PENDING   Incomplete  CULTURE, BLOOD (ROUTINE X 2)     Status: None   Collection Time    02/23/14  6:30 PM      Result Value Ref Range Status   Specimen Description BLOOD LEFT ARM   Final   Special Requests BOTTLES DRAWN AEROBIC AND ANAEROBIC 10CC   Final   Culture  Setup Time     Final   Value: 02/23/2014 22:16     Performed at [facility]   Culture     Final    Value: [place] IN CLUSTERS     Note: Gram Stain Report Called to,Read Back By and Verified With: TY HICKS ON 02/24/2014 AT 10:44P BY WILEJ     Performed at [facility]   Report Status PENDING   Incomplete  URINE CULTURE     Status: None   Collection Time    02/23/14  9:46 PM      Result Value Ref Range Status   Specimen Description URINE, CLEAN CATCH   Final   Special Requests NONE   Final   Culture  Setup Time     Final   Value: 02/24/2014 04:09     Performed at  [facility]     Final   Value: NO GROWTH     Performed at [facility]     Final   Value: NO GROWTH     Performed at [facility]   Report Status 02/25/2014 FINAL   Final  GC/CHLAMYDIA PROBE AMP     Status: None   Collection Time    02/23/14  9:46 PM      Result Value Ref Range Status   CT Probe RNA NEGATIVE  NEGATIVE Final   GC Probe RNA NEGATIVE  NEGATIVE Final   Comment: (NOTE)                                                                                               **Normal Reference Range: Negative**          Assay performed using the Gen-Probe APTIMA COMBO2 (R) Assay.     Acceptable specimen types for this assay include APTIMA Swabs (Unisex,     endocervical, urethral, or vaginal), first void urine, and ThinPrep     liquid based cytology samples.     Performed at [facility]       Studies: Dg Chest 2 View  02/23/2014   CLINICAL DATA:  Wound check.  Hypotension  EXAM: CHEST  2 VIEW  COMPARISON:  02/23/2014  FINDINGS: Heart size and vascularity are normal. Lungs are clear without infiltrate or effusion. Port-A-Cath tip in the SVC.  IMPRESSION: No active cardiopulmonary disease.   Electronically Signed   By: Franchot Gallo M.D.   On: 02/23/2014 18:59   US Renal  02/23/2014   CLINICAL DATA:  Acute renal failure.  EXAM: RENAL/URINARY TRACT ULTRASOUND COMPLETE  COMPARISON:  Renal ultrasound January 19, 2014.  FINDINGS: Right Kidney:   Length: 11.7 cm. Moderate right hydronephrosis. 1 cm echogenic upper pole and 6 mm echogenic middle pole calculi with acoustic shadowing.  Left Kidney:  Length: 13.2 cm. 7 mm echogenic upper pole and 5 mm echogenic interpolar renal calculi with acoustic shadowing. Trace left perinephric fluid.  Bladder:  Possible debris within the bladder.  IMPRESSION: Stable right hydronephrosis.  Bilateral nephrolithiasis.  Possible debris within the urinary bladder. Consider correlation with urinary analysis.   Electronically Signed   By: Elon Alas   On: 02/23/2014 21:01   Dg Chest Port 1 View  02/23/2014   CLINICAL DATA:  Possible sepsis  EXAM: PORTABLE CHEST - 1 VIEW  COMPARISON:  01/19/2014  FINDINGS: The lungs are now clear. Negative for pneumonia or effusion. Heart size and vascularity are normal. Port-A-Cath tip in the SVC.  IMPRESSION: No active disease.   Electronically Signed   By: Franchot Gallo M.D.   On: 02/23/2014 18:17        Scheduled Meds: . calcium carbonate  800 mg of elemental calcium Oral TID  . clotrimazole-betamethasone   Topical BID  . efavirenz  600 mg Oral QHS  . emtricitabine-tenofovir  1 tablet Oral Q48H  . feeding supplement (ENSURE COMPLETE)  237 mL Oral TID BM  . ferrous sulfate  325  mg Oral BID WC  . vancomycin  1,000 mg Intravenous Q24H   Continuous Infusions: .  sodium bicarbonate  infusion 1000 mL 100 mL/hr at 02/25/14 2111    Principal Problem:   Staphylococcus aureus bacteremia Active Problems:   HIV disease   Hodgkin's disease   Hypokalemia   Thrombocytopenia   Acute renal failure   Skin rash    Time spent: 40 minutes.    Vernell Leep, MD, FACP, FHM. [facility] Pager [phone]  If 7PM-7AM, please contact night-coverage [email or web address] Password [email or web address] 02/25/2014, 11:21 AM    LOS: 2 days

## 2014-02-25 NOTE — Consult Note (Signed)
[name]ious [facility]    Date of Admission:  02/23/2014    Total days of antibiotics 2        Day 2 Vancomycin               Reason for Consult: Staph aureus bacteremia    Referring Physician: Dr. Algis Liming Primary Care Physician: Dr. Christean Grief Avbuere/Dr. Scharlene Gloss  Principal Problem:   Staphylococcus aureus bacteremia Active Problems:   HIV disease   Hodgkin's disease   Hypokalemia   Thrombocytopenia   Acute renal failure   Skin rash   . calcium carbonate  800 mg of elemental calcium Oral TID  . clotrimazole-betamethasone   Topical BID  . efavirenz  600 mg Oral QHS  . emtricitabine-tenofovir  1 tablet Oral Q48H  . feeding supplement (ENSURE COMPLETE)  237 mL Oral TID BM  . ferrous sulfate  325 mg Oral BID WC  . vancomycin  1,000 mg Intravenous Q24H    Recommendations: 1. Continue Vancomycin + Ancef; narrow according to sensitivities 2. Repeat blood cultures today 3. TTE to assess for valvular abnormalities/vegetations 4. No need for isolation for shingles 5. Hold antiretroviral therapy for now  Assessment: 33 y/o M w/ PMHx of HIV and Hodgkin's Lymphoma receiving chemotherapy, admitted for AKI and hypotension, found to have staph aureus bacteremia. Patient has been afebrile since admission w/ no leukocytosis, however, he has a h/o neutropenia. Patient denies any recent fever, chills, nausea, dysuria, cough, or skin lesions. No significant abnormalities on exam, no murmurs heard on cardiac auscultation. Lungs clear. Patient does have a rash, excoriated lesions on the right posterior thigh/groin which he claims have been present for 2 weeks. He says this area is pruritic and has not resolved w/ use of creams and steroids. Given appearance of lesions in setting of chemotherapy and HIV, most likely shingles, which appear to be healing. This skin infection was most likely complicated by staph as well, possibly resulting in the patient's bacteremia at this  time. Thomas [name] also has a left chest port which appears clean and intact, do not suspect is the source of his infection at this time.   HPI: Thomas [name] is a 33 y.o. male w/ past medical history of HIV (CD4 360, VL <30 in 10/2013) on Atripla, Hodgkin's Lymphoma (IIIA; diagnosed 07/2013) on AVBD therapy (doxorubicin, vinblastine, bleomycin, and dacarbazine), admitted on 02/23/14 after he presented to the ED w/ a new rash, found to have acute kidney injury and hypotension. The rash he describes as being present on his left thigh, groin, and buttock ans say this has been present for 2 weeks, accompanied by pruritis and numbness. He denies overlying pain or burning. Patient denies any other significant complaints; no fever, chills, nausea, vomiting, abdominal pain, diarrhea, SOB, cough, or chest pain. Patient was also admitted on 01/17/14 for diarrhea, found to have septic shock at that time, treated w/ Cipro and Flagyl, but found to be C. Diff negative. Also of note, patient receives chemotherapy biweekly, most recently on 02/13/14.   Review of Systems: General: Denies fever, chills, diaphoresis, appetite change.  Respiratory: Denies SOB, DOE, and cough.   Cardiovascular: Denies chest pain and palpitations.  Gastrointestinal: Denies nausea, vomiting, abdominal pain, and diarrhea. Genitourinary: Denies dysuria, urgency, frequency, hematuria, and flank pain. Musculoskeletal: Denies myalgias, back pain, joint swelling, arthralgias and gait problem.  Skin: Positive for rash on left posterior thigh. Denies pallor and wounds.  Neurological: Denies dizziness,  weakness, lightheadedness, and headaches.   Past Medical History  Diagnosis Date  . HIV disease 06/25/2012  . Skin lesion 07/03/2012  . Leg pain 07/03/2012  . Sinus tachycardia 08/06/2012  . Penile cyst 08/06/2012  . Cancer     History  Substance Use Topics  . Smoking status: Former Smoker -- 1.00 packs/day for 15 years    Types: Cigarettes     Start date: 08/24/2013  . Smokeless tobacco: Never Used     Comment: quitline info, counseling  . Alcohol Use: No    History [name]. No pertinent family history. Allergies  Allergen Reactions  . Tylenol [Acetaminophen]     sweats    OBJECTIVE: Blood pressure 81/49, pulse 96, temperature 97.8 F (36.6 C), temperature source Oral, resp. rate 18, weight 134 lb 4.2 oz (60.9 kg), SpO2 99.00%.  Physical Exam: General: Pleasant 33 y/o male, alert, cooperative, no acute distress.  Skin: A few umbilicated papules on face. Scattered, chronic excoriated lesions with a few on his face and a few on his upper arm. There is one on his left lower quadrant. He says all of these are chronic  HEENT: PERRL, EOMI. Moist mucus membranes Neck: Full range of motion without pain, supple, no lymphadenopathy or carotid bruits Lungs: Clear to ascultation bilaterally, no wheezes, rales, rhonchi Heart: Regular rate and rhythm, no murmurs. Chest: Right upper chest Port-A-Cath site appears normal  Abdomen: Soft, non-tender, non-distended, bowel sounds present. Extremities: No cyanosis, clubbing, or edema. Excoriated healing rash over left posterior thigh, groin and buttock. 3 pustules distally  Neurologic: Alert & oriented X3, cranial nerves II-XII intact, strength grossly intact, sensation intact to light touch  Lab Results Lab Results  Component Value Date   WBC 4.5 02/25/2014   HGB 7.8* 02/25/2014   HCT 23.2* 02/25/2014   MCV 100.0 02/25/2014   PLT 92* 02/25/2014    Lab Results  Component Value Date   CREATININE 2.23* 02/25/2014   BUN 22 02/25/2014   NA 140 02/25/2014   K 3.4* 02/25/2014   CL 113* 02/25/2014   CO2 15* 02/25/2014    Lab Results  Component Value Date   ALT 25 02/23/2014   AST 18 02/23/2014   ALKPHOS 183* 02/23/2014   BILITOT 0.3 02/23/2014     Microbiology: Recent Results (from the past 240 hour(s))  CULTURE, BLOOD (ROUTINE X 2)     Status: None   Collection Time    02/23/14  6:08 PM      Result  Value Ref Range Status   Specimen Description BLOOD RIGHT ARM   Final   Special Requests BOTTLES DRAWN AEROBIC AND ANAEROBIC 10CC   Final   Culture  Setup Time     Final   Value: 02/23/2014 22:16     Performed at [facility]   Culture     Final   Value: STAPHYLOCOCCUS AUREUS     Note: RIFAMPIN AND GENTAMICIN SHOULD NOT BE USED AS SINGLE DRUGS FOR TREATMENT OF STAPH INFECTIONS.     Note: Gram Stain Report Called to,Read Back By and Verified With: ANDY BRAKE@3 :00PM ON 02/24/14 BY DANTS     Performed at [facility]   Report Status PENDING   Incomplete  CULTURE, BLOOD (ROUTINE X 2)     Status: None   Collection Time    02/23/14  6:30 PM      Result Value Ref Range Status   Specimen Description BLOOD LEFT ARM   Final   Special Requests BOTTLES  DRAWN AEROBIC AND ANAEROBIC 10CC   Final   Culture  Setup Time     Final   Value: 02/23/2014 22:16     Performed at [facility]   Culture     Final   Value: GRAM POSITIVE COCCI IN CLUSTERS     Note: Gram Stain Report Called to,Read Back By and Verified With: TY HICKS ON 02/24/2014 AT 10:44P BY WILEJ     Performed at [facility]   Report Status PENDING   Incomplete  URINE CULTURE     Status: None   Collection Time    02/23/14  9:46 PM      Result Value Ref Range Status   Specimen Description URINE, CLEAN CATCH   Final   Special Requests NONE   Final   Culture  Setup Time     Final   Value: 02/24/2014 04:09     Performed at [facility] Count     Final   Value: NO GROWTH     Performed at [facility]   Culture     Final   Value: NO GROWTH     Performed at [facility]   Report Status 02/25/2014 FINAL   Final  GC/CHLAMYDIA PROBE AMP     Status: None   Collection Time    02/23/14  9:46 PM      Result Value Ref Range Status   CT Probe RNA NEGATIVE  NEGATIVE Final   GC Probe RNA NEGATIVE  NEGATIVE Final   Comment: (NOTE)                                                                                                **Normal Reference Range: Negative**          Assay performed using the Gen-Probe APTIMA COMBO2 (R) Assay.     Acceptable specimen types for this assay include APTIMA Swabs (Unisex,     endocervical, urethral, or vaginal), first void urine, and ThinPrep     liquid based cytology samples.     Performed at [place], MD PGY-2, Internal Medicine Pager: [phone] 02/25/2014, 11:12 AM  Attending addendum: I have seen and examined [name] and discussed his case with Dr. Ronnald Ramp. I believe the rash on his left posterior thigh is probably healing shingles. A lesion that are crusted and he does not need isolation. He would not be a significant benefit from treatment with antivirals at this point. He also has acute staph aureus bacteremia. The portal of entry could be his shingles rash or his Port-A-Cath. We will treat with vancomycin and cefazolin pending susceptibility results for optimal coverage of MRSA and MSSA. Repeat blood cultures are pending.  He has acute renal insufficiency and non-gap metabolic acidosis. I suspect this is related to hypotension and ATN but will hold his antiretroviral therapy for now since the tenofovir component of Atripla can cause renal insufficiency.  Michel Bickers, MD [facility] for [facility] [phone] pager   [phone] cell 02/25/2014, 2:48 PM

## 2014-02-25 NOTE — Progress Notes (Signed)
Pt's blood pressure is 82/44 with a heart rate of 85 while at rest. Pt asymptomatic. No distress noted. Md made aware no new orders received, stated to continue to monitor the patient for any change in his status.. Will f/u.

## 2014-02-25 NOTE — Clinical Documentation Improvement (Signed)
Possible Clinical Conditions?   Septicemia / Sepsis Severe Sepsis Neutropenic Sepsis  SIRS Septic Shock Sepsis with UTI Bacterial infection of unknown etiology / source Other Condition  Cannot clinically Determine    Risk Factors: Notably hypotensive upon arrival per 8/04 ED note. 2 of 2 positive blood cultures with gram positive cocci in clusters, started vancomycin per pharmacy, per 8/04 progress notes.  Thank You, Theron Arista, Clinical Documentation Specialist:  [phone]  [place] Information Management

## 2014-02-25 NOTE — Progress Notes (Signed)
[facility]  2D Echocardiogram completed.  [place], RDCS 02/25/2014 3:44 PM

## 2014-02-26 ENCOUNTER — Ambulatory Visit: Payer: Medicare Other | Admitting: Internal Medicine

## 2014-02-26 DIAGNOSIS — B029 Zoster without complications: Secondary | ICD-10-CM

## 2014-02-26 LAB — CULTURE, BLOOD (ROUTINE X 2)

## 2014-02-26 LAB — BASIC METABOLIC PANEL
Anion gap: 11 (ref 5–15)
BUN: 13 mg/dL (ref 6–23)
CHLORIDE: 104 meq/L (ref 96–112)
CO2: 25 mEq/L (ref 19–32)
Calcium: 8.3 mg/dL — ABNORMAL LOW (ref 8.4–10.5)
Creatinine, Ser: 1.72 mg/dL — ABNORMAL HIGH (ref 0.50–1.35)
GFR calc non Af Amer: 52 mL/min — ABNORMAL LOW (ref >90)
GFR, EST AFRICAN AMERICAN: 60 mL/min — AB (ref >90)
Glucose, Bld: 90 mg/dL (ref 70–99)
Potassium: 3.4 mEq/L — ABNORMAL LOW (ref 3.7–5.3)
Sodium: 140 mEq/L (ref 137–147)

## 2014-02-26 MED ORDER — POTASSIUM CHLORIDE IN NACL 40-0.9 MEQ/L-% IV SOLN
INTRAVENOUS | Status: AC
  Administered 2014-02-26: 100 mL/h via INTRAVENOUS
  Filled 2014-02-26 (×3): qty 1000

## 2014-02-26 MED ORDER — POTASSIUM CHLORIDE CRYS ER 20 MEQ PO TBCR
40.0000 meq | EXTENDED_RELEASE_TABLET | Freq: Once | ORAL | Status: AC
Start: 2014-02-26 — End: 2014-02-26
  Administered 2014-02-26: 40 meq via ORAL
  Filled 2014-02-26: qty 2

## 2014-02-26 NOTE — Progress Notes (Signed)
[place] for Infectious Disease    Date of Admission:  02/23/2014   Total days of antibiotics 3        Day 3 Vancomycin        Day 2 Ancef         Principal Problem:   Staphylococcus aureus bacteremia Active Problems:   HIV disease   Hodgkin's disease   Hypokalemia   Thrombocytopenia   Acute renal failure   Shingles rash   Metabolic acidosis, normal anion gap (NAG)   . calcium carbonate  800 mg of elemental calcium Oral TID  .  ceFAZolin (ANCEF) IV  2 g Intravenous 3 times per day  . clotrimazole-betamethasone   Topical BID  . feeding supplement (ENSURE COMPLETE)  237 mL Oral TID BM  . ferrous sulfate  325 mg Oral BID WC  . vancomycin  1,000 mg Intravenous Q24H    Subjective: No complaints today. Left thigh rash improving per patient.   Review of Systems: General: Denies fever, chills, appetite change and fatigue.  Respiratory: Denies SOB, cough, and wheezing.   Cardiovascular: Denies chest pain and palpitations.  Gastrointestinal: Denies nausea, vomiting, abdominal pain, and diarrhea. Genitourinary: Denies dysuria. Musculoskeletal: Denies myalgias, back pain, joint swelling, arthralgias and gait problem.  Skin: Positive for rash. Denies pallor and wounds.  Neurological: Denies dizziness, weakness, lightheadedness, and headaches.  Psychiatric/Behavioral: Denies mood changes, sleep disturbance and agitation.   Past Medical History  Diagnosis Date  . HIV disease 06/25/2012  . Skin lesion 07/03/2012  . Leg pain 07/03/2012  . Sinus tachycardia 08/06/2012  . Penile cyst 08/06/2012  . Cancer     History  Substance Use Topics  . Smoking status: Former Smoker -- 1.00 packs/day for 15 years    Types: Cigarettes    Start date: 08/24/2013  . Smokeless tobacco: Never Used     Comment: quitline info, counseling  . Alcohol Use: No    History reviewed. No pertinent family history.  Allergies  Allergen Reactions  . Tylenol [Acetaminophen]     sweats      Objective: Temp:  [97.8 F (36.6 C)-99 F (37.2 C)] 98.3 F (36.8 C) (08/06 0624) Pulse Rate:  [80-101] 80 (08/06 0624) Resp:  [16-18] 18 (08/06 0624) BP: (81-96)/(49-59) 96/57 mmHg (08/06 0624) SpO2:  [99 %-100 %] 99 % (08/06 0624) Weight:  [140 lb 6.9 oz (63.7 kg)] 140 lb 6.9 oz (63.7 kg) (08/05 2100)  General: Pleasant 33 y/o [name], alert, cooperative, no acute distress.  Skin: A few umbilicated papules on face. Scattered, chronic excoriated lesions with a few on his face and a few on his upper arm. There is one on his left lower quadrant. He says all of these are chronic  HEENT: PERRL, EOMI. Moist mucus membranes Neck: Full range of motion without pain, supple, no lymphadenopathy or carotid bruits Lungs: Clear to ascultation bilaterally, no wheezes, rales, rhonchi Heart: Regular rate and rhythm, no murmurs.  Chest: Right upper chest Port-A-Cath site appears normal  Abdomen: Soft, non-tender, non-distended, bowel sounds present.  Extremities: No cyanosis, clubbing, or edema. Excoriated healing rash over left posterior thigh, groin and buttock. 3 pustules distally  Neurologic: Alert & oriented X3, cranial nerves II-XII intact, strength grossly intact, sensation intact to light touch   Lab Results Lab Results  Component Value Date   WBC 4.5 02/25/2014   HGB 7.8* 02/25/2014   HCT 23.2* 02/25/2014   MCV 100.0 02/25/2014   PLT 92*  02/25/2014    Lab Results  Component Value Date   CREATININE 1.72* 02/26/2014   BUN 13 02/26/2014   NA 140 02/26/2014   K 3.4* 02/26/2014   CL 104 02/26/2014   CO2 25 02/26/2014    Lab Results  Component Value Date   ALT 25 02/23/2014   AST 18 02/23/2014   ALKPHOS 183* 02/23/2014   BILITOT 0.3 02/23/2014      Microbiology: Recent Results (from the past 240 hour(s))  CULTURE, BLOOD (ROUTINE X 2)     Status: None   Collection Time    02/23/14  6:08 PM      Result Value Ref Range Status   Specimen Description BLOOD RIGHT ARM   Final   Special Requests BOTTLES  DRAWN AEROBIC AND ANAEROBIC 10CC   Final   Culture  Setup Time     Final   Value: 02/23/2014 22:16     Performed at [facility]   Culture     Final   Value: STAPHYLOCOCCUS AUREUS     Note: RIFAMPIN AND GENTAMICIN SHOULD NOT BE USED AS SINGLE DRUGS FOR TREATMENT OF STAPH INFECTIONS. This organism is presumed to be Clindamycin resistant based on detection of inducible Clindamycin resistance.     Note: Gram Stain Report Called to,Read Back By and Verified With: ANDY BRAKE@3 :00PM ON 02/24/14 BY DANTS     Performed at [facility]   Report Status 02/26/2014 FINAL   Final   Organism ID, Bacteria STAPHYLOCOCCUS AUREUS   Final  CULTURE, BLOOD (ROUTINE X 2)     Status: None   Collection Time    02/23/14  6:30 PM      Result Value Ref Range Status   Specimen Description BLOOD LEFT ARM   Final   Special Requests BOTTLES DRAWN AEROBIC AND ANAEROBIC 10CC   Final   Culture  Setup Time     Final   Value: 02/23/2014 22:16     Performed at [facility]   Culture     Final   Value: STAPHYLOCOCCUS AUREUS     Note: SUSCEPTIBILITIES PERFORMED ON PREVIOUS CULTURE WITHIN THE LAST 5 DAYS.     Note: Gram Stain Report Called to,Read Back By and Verified With: TY HICKS ON 02/24/2014 AT 10:44P BY WILEJ     Performed at [facility]   Report Status 02/26/2014 FINAL   Final  URINE CULTURE     Status: None   Collection Time    02/23/14  9:46 PM      Result Value Ref Range Status   Specimen Description URINE, CLEAN CATCH   Final   Special Requests NONE   Final   Culture  Setup Time     Final   Value: 02/24/2014 04:09     Performed at [facility] Count     Final   Value: NO GROWTH     Performed at [facility]   Culture     Final   Value: NO GROWTH     Performed at [facility]   Report Status 02/25/2014 FINAL   Final  GC/CHLAMYDIA PROBE AMP     Status: None   Collection Time    02/23/14  9:46 PM      Result Value Ref Range Status   CT Probe  RNA NEGATIVE  NEGATIVE Final   GC Probe RNA NEGATIVE  NEGATIVE Final   Comment: (NOTE)                                                                                               **  Normal Reference Range: Negative**          Assay performed using the Gen-Probe APTIMA COMBO2 (R) Assay.     Acceptable specimen types for this assay include APTIMA Swabs (Unisex,     endocervical, urethral, or vaginal), first void urine, and ThinPrep     liquid based cytology samples.     Performed at [place], BLOOD (ROUTINE X 2)     Status: None   Collection Time    02/25/14 12:20 PM      Result Value Ref Range Status   Specimen Description BLOOD LEFT ARM   Final   Special Requests BOTTLES DRAWN AEROBIC AND ANAEROBIC 10CC   Final   Culture  Setup Time     Final   Value: 02/25/2014 17:15     Performed at [facility]   Culture     Final   Value:        BLOOD CULTURE RECEIVED NO GROWTH TO DATE CULTURE WILL BE HELD FOR 5 DAYS BEFORE ISSUING A FINAL NEGATIVE REPORT     Performed at [facility]   Report Status PENDING   Incomplete  CULTURE, BLOOD (ROUTINE X 2)     Status: None   Collection Time    02/25/14 12:40 PM      Result Value Ref Range Status   Specimen Description BLOOD LEFT ARM   Final   Special Requests BOTTLES DRAWN AEROBIC ONLY 10CC   Final   Culture  Setup Time     Final   Value: 02/25/2014 17:15     Performed at [facility]   Culture     Final   Value:        BLOOD CULTURE RECEIVED NO GROWTH TO DATE CULTURE WILL BE HELD FOR 5 DAYS BEFORE ISSUING A FINAL NEGATIVE REPORT     Performed at [facility]   Report Status PENDING   Incomplete    Studies/Results: No results found.  Assessment: 33 y/o M w/ PMHx of HIV and Hodgkin's Lymphoma receiving chemotherapy, admitted for AKI and hypotension, found to have healing shingles w/ staph aureus bacteremia. Blood culture sensitivities reveal MSSA. TTE performed yesterday w/ no evidence of  endocarditis. Etiology of bacteremia most likely related to shingles rash, however, port-a-cath still a concern. Given the presence of this port , feel that TEE is important to further assess for valvular abnormalities/vegetations. Patient continues to deny fever, chills, or malaise. Repeat blood cultures from 02/25/14 are negative to date. Ideally, patient should receive antibiotics through port-a-cath if it is thought that the port is in fact involved w/ bacteremia, however, if this is not possible, removal of the port should be considered in addition to PICC line placement for prolonged antibiotic administration.   Plan: 1. Discontinue Vancomycin; continue Ancef for MSSA 2. Recommend TEE  Luanne Bras, MD PGY-2, Internal Medicine Pager: [phone] 02/26/2014, 9:28 AM  Attending addendum: The 3 small pustules on his posterior left thigh have now resolved. Suspect MSSA superinfection of recent shingles rash is the source for his MSSA bacteremia but still worried about possible secondary Port-A-Cath infection. I do recommend TEE to further evaluate for the possibility of vegetations on his native valves and catheter tip. If we leave his Port-A-Cath in and try to salvage it there is obviously a risk of relapse if the Port-A-Cath is infected.  Michel Bickers, MD [facility] for [facility] [phone] pager   [phone] cell 02/26/2014,  3:49 PM

## 2014-02-26 NOTE — Progress Notes (Signed)
PROGRESS NOTE    Thomas [name] BHA:[id number] DOB: 1981/07/24 DOA: 02/23/2014 PCP: Philis Fendt, MD  HPI/Brief narrative 33 y/o male patient with h/o HIV (follows with Dr. Linus Salmons), Hodgkin's Lymphoma on chemo - last 2 weeks ago(follows with Dr. Alen Blew), admitted on 8/3 with worsening pruritis of left side groin and thigh rash that he had for 5-7 days PTA and was not getting better with home remedies including streoid cream. Last CD4 in April was 360, viral load undetectable at that time. 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 He has MSSA bacteremia- etiology- ? Shingles rash vs port. On Ancef. 2 D Echo neg. Requesting Card for TEE.    Assessment/Plan:  1. Acute on Chronic Renal Failure: unclear etiology. States no GI symptoms and appetite good. Denies NSAID's. ? ATN from hypotension. Renal US shows stable right hydronephrosis. Uric acid and CK normal. Chemo meds- less likely etiology. Improving with IVF- continue and follow up BMP. Baseline creat: fluctuating: ? 1.6-1.9.   2. Non AG metabolic acidosis: Unclear etiology. Has had this on reviewing past labs earlier this year.? Secondary to HIV medications. Resolved after IV bicarbonate infusion- DC'ed.  3. MSSA bacteremia: 2 of 2 blood cultures positive. Source-? From infected shingles versus Port a cath. Even though he has bacteremia, do not believe that his initial presentation was consistent with sepsis- he has chronic asymptomatic hypotension. 2 D Echo negative for vegetations. Requested Cardiology for TEE. Initially was empirically on Van and Ancef > now narrowed to Ancef. ID follow up appreciated. Surveillance blood culture 8/5: neg to date.  Discussed with Dr. Alen Blew- would prefer if port can be kept and he gets his prolonged Abx through the port. However, it may not be able to do this at home. Await Dr. Hale Bogus input 4. Hypotension: apparently chronic and  asymptomatic. 5. Shingles/Skin rash: over left side of groin, thigh and buttock. Evaluated by ID and consistent with shingles involving S1 dermatome-all lesions mostly dried up-no isolation or treatment recommended at this time. 6. HIV: meds held by ID. 7. Hodgkin's disease: next chemo was due on 02/27/14. Defer Mx to OP Oncologist. D/W Dr. Alen Blew- he will arrange follow up. 8. Hypokalemia: replace & FU. 9. Chronic Anemia: Likely 2/2 to chronic disease, HIV, lymphoma and Rx. No reported bleeding. Transfuse if Hb < 7. Stable. 10. Thrombocytopenia: chronic and stable. 11. Severe malnutrition in the context of chronic illness & Underweight: Nutrition consult appreciated- Mx per them.     Code Status: Full Family Communication: None at bedside. Disposition Plan: DC home when medically stable.   Consultants:  Infectious disease  Cardiology- for TEE  Procedures:  None  Antibiotics:  Vancomycin- DC'ed  Ancef  Subjective: Patient denies complaints.  Objective: Filed Vitals:   02/25/14 1713 02/25/14 2100 02/26/14 0624 02/26/14 1013  BP: 94/59 87/57 96/57  95/55  Pulse: 99 101 80 77  Temp: 98.2 F (36.8 C) 99 F (37.2 C) 98.3 F (36.8 C) 98.4 F (36.9 C)  TempSrc: Oral Oral Oral Oral  Resp: 16 18 18 18   Height:      Weight:  63.7 kg (140 lb 6.9 oz)    SpO2: 100% 100% 99% 98%    Intake/Output Summary (Last 24 hours) at 02/26/14 1339 Last data filed at 02/26/14 1025  Gross per 24 hour  Intake    480 ml  Output   3300 ml  Net  -2820 ml  Filed Weights   02/23/14 2309 02/25/14 2100  Weight: 60.9 kg (134 lb 4.2 oz) 63.7 kg (140 lb 6.9 oz)     Exam:  General exam: Pleasant young male lying comfortably in bed. Does not look toxic or septic. Respiratory system: Clear. No increased work of breathing. Cardiovascular system: S1 & S2 heard, RRR. No JVD, murmurs, gallops, clicks or pedal edema. Gastrointestinal system: Abdomen is nondistended, soft and nontender. Normal  bowel sounds heard. Central nervous system: Alert and oriented. No focal neurological deficits. Extremities: Symmetric 5 x 5 power. Skin: Excoriated drying skin rash over left groin, extending to left posterior thing, left buttock and hip.   Data Reviewed: Basic Metabolic Panel:  Recent Labs Lab 02/23/14 1722 02/24/14 0340 02/24/14 1715 02/25/14 0540 02/26/14 0530  NA 132* 135* 136* 140 140  K 3.2* 3.3* 3.3* 3.4* 3.4*  CL 101 105 107 113* 104  CO2 15* 16* 15* 15* 25  GLUCOSE 90 90 96 92 90  BUN 50* 43* 30* 22 13  CREATININE 3.93* 3.24* 2.61* 2.23* 1.72*  CALCIUM 8.4 7.9* 7.9* 8.0* 8.3*   Liver Function Tests:  Recent Labs Lab 02/23/14 1722  AST 18  ALT 25  ALKPHOS 183*  BILITOT 0.3  PROT 7.9  ALBUMIN 3.2*   No results found for this basename: LIPASE, AMYLASE,  in the last 168 hours No results found for this basename: AMMONIA,  in the last 168 hours CBC:  Recent Labs Lab 02/23/14 1722 02/24/14 0340 02/25/14 0540  WBC 6.3 4.9 4.5  NEUTROABS 5.1  --   --   HGB 9.9* 7.8* 7.8*  HCT 28.5* 22.7* 23.2*  MCV 97.9 95.8 100.0  PLT 88* 94* 92*   Cardiac Enzymes:  Recent Labs Lab 02/24/14 1715  CKTOTAL 74   BNP (last 3 results) No results found for this basename: PROBNP,  in the last 8760 hours CBG: No results found for this basename: GLUCAP,  in the last 168 hours  Recent Results (from the past 240 hour(s))  CULTURE, BLOOD (ROUTINE X 2)     Status: None   Collection Time    02/23/14  6:08 PM      Result Value Ref Range Status   Specimen Description BLOOD RIGHT ARM   Final   Special Requests BOTTLES DRAWN AEROBIC AND ANAEROBIC 10CC   Final   Culture  Setup Time     Final   Value: 02/23/2014 22:16     Performed at [facility]   Culture     Final   Value: STAPHYLOCOCCUS AUREUS     Note: RIFAMPIN AND GENTAMICIN SHOULD NOT BE USED AS SINGLE DRUGS FOR TREATMENT OF STAPH INFECTIONS. This organism is presumed to be Clindamycin resistant based on  detection of inducible Clindamycin resistance.     Note: Gram Stain Report Called to,Read Back By and Verified With: ANDY BRAKE@3 :00PM ON 02/24/14 BY DANTS     Performed at [facility]   Report Status 02/26/2014 FINAL   Final   Organism ID, Bacteria STAPHYLOCOCCUS AUREUS   Final  CULTURE, BLOOD (ROUTINE X 2)     Status: None   Collection Time    02/23/14  6:30 PM      Result Value Ref Range Status   Specimen Description BLOOD LEFT ARM   Final   Special Requests BOTTLES DRAWN AEROBIC AND ANAEROBIC 10CC   Final   Culture  Setup Time     Final   Value: 02/23/2014 22:16  Performed at [facility]     Final   Value: STAPHYLOCOCCUS AUREUS     Note: SUSCEPTIBILITIES PERFORMED ON PREVIOUS CULTURE WITHIN THE LAST 5 DAYS.     Note: Gram Stain Report Called to,Read Back By and Verified With: TY HICKS ON 02/24/2014 AT 10:44P BY WILEJ     Performed at [facility]   Report Status 02/26/2014 FINAL   Final  URINE CULTURE     Status: None   Collection Time    02/23/14  9:46 PM      Result Value Ref Range Status   Specimen Description URINE, CLEAN CATCH   Final   Special Requests NONE   Final   Culture  Setup Time     Final   Value: 02/24/2014 04:09     Performed at [facility]     Final   Value: NO GROWTH     Performed at [facility]   Culture     Final   Value: NO GROWTH     Performed at [facility]   Report Status 02/25/2014 FINAL   Final  GC/CHLAMYDIA PROBE AMP     Status: None   Collection Time    02/23/14  9:46 PM      Result Value Ref Range Status   CT Probe RNA NEGATIVE  NEGATIVE Final   GC Probe RNA NEGATIVE  NEGATIVE Final   Comment: (NOTE)                                                                                               **Normal Reference Range: Negative**          Assay performed using the Gen-Probe APTIMA COMBO2 (R) Assay.     Acceptable specimen types for this assay include APTIMA Swabs  (Unisex,     endocervical, urethral, or vaginal), first void urine, and ThinPrep     liquid based cytology samples.     Performed at [place], BLOOD (ROUTINE X 2)     Status: None   Collection Time    02/25/14 12:20 PM      Result Value Ref Range Status   Specimen Description BLOOD LEFT ARM   Final   Special Requests BOTTLES DRAWN AEROBIC AND ANAEROBIC 10CC   Final   Culture  Setup Time     Final   Value: 02/25/2014 17:15     Performed at [facility]   Culture     Final   Value:        BLOOD CULTURE RECEIVED NO GROWTH TO DATE CULTURE WILL BE HELD FOR 5 DAYS BEFORE ISSUING A FINAL NEGATIVE REPORT     Performed at [facility]   Report Status PENDING   Incomplete  CULTURE, BLOOD (ROUTINE X 2)     Status: None   Collection Time    02/25/14 12:40 PM      Result Value Ref Range Status   Specimen Description BLOOD LEFT ARM   Final   Special Requests BOTTLES DRAWN AEROBIC ONLY  10CC   Final   Culture  Setup Time     Final   Value: 02/25/2014 17:15     Performed at [facility]   Culture     Final   Value:        BLOOD CULTURE RECEIVED NO GROWTH TO DATE CULTURE WILL BE HELD FOR 5 DAYS BEFORE ISSUING A FINAL NEGATIVE REPORT     Performed at [facility]   Report Status PENDING   Incomplete       Studies: No results found.      Scheduled Meds: . calcium carbonate  800 mg of elemental calcium Oral TID  .  ceFAZolin (ANCEF) IV  2 g Intravenous 3 times per day  . clotrimazole-betamethasone   Topical BID  . feeding supplement (ENSURE COMPLETE)  237 mL Oral TID BM  . ferrous sulfate  325 mg Oral BID WC   Continuous Infusions: . 0.9 % NaCl with KCl 40 mEq / L 100 mL/hr (02/26/14 1118)    Principal Problem:   Staphylococcus aureus bacteremia Active Problems:   HIV disease   Hodgkin's disease   Hypokalemia   Thrombocytopenia   Acute renal failure   Shingles rash   Metabolic acidosis, normal anion gap (NAG)    Time  spent: 20 minutes.    Vernell Leep, MD, FACP, FHM. [facility] Pager [phone]  If 7PM-7AM, please contact night-coverage [email or web address] Password [email or web address] 02/26/2014, 1:39 PM    LOS: 3 days

## 2014-02-26 NOTE — Progress Notes (Signed)
Events noted patient well known to me with Hodgkin's disease. His laboratory data and medical records were reviewed. His case was discussed with Dr. Algis Liming and I agree with the current management. He is scheduled to receive systemic chemotherapy on 02/27/2014 and he will be rescheduled to a later date upon his discharge. This was discussed with the patient today face-to-face and he is looking forward to resuming his systemic therapy at the [facility].

## 2014-02-27 ENCOUNTER — Ambulatory Visit: Payer: Medicare Other

## 2014-02-27 ENCOUNTER — Encounter (HOSPITAL_COMMUNITY)
Admission: EM | Disposition: A | Discharge status: Home Health | Payer: Self-pay | Source: Home / Self Care | Attending: Internal Medicine

## 2014-02-27 ENCOUNTER — Other Ambulatory Visit: Payer: Medicare Other

## 2014-02-27 ENCOUNTER — Encounter (HOSPITAL_COMMUNITY): Payer: Self-pay | Admitting: Radiology

## 2014-02-27 ENCOUNTER — Inpatient Hospital Stay (HOSPITAL_COMMUNITY): Payer: Medicare Other

## 2014-02-27 ENCOUNTER — Ambulatory Visit: Payer: Medicare Other | Admitting: Physician Assistant

## 2014-02-27 DIAGNOSIS — N189 Chronic kidney disease, unspecified: Secondary | ICD-10-CM

## 2014-02-27 DIAGNOSIS — T80211A Bloodstream infection due to central venous catheter, initial encounter: Secondary | ICD-10-CM

## 2014-02-27 DIAGNOSIS — I38 Endocarditis, valve unspecified: Secondary | ICD-10-CM

## 2014-02-27 HISTORY — DX: Bloodstream infection due to central venous catheter, initial encounter: T80.211A

## 2014-02-27 HISTORY — PX: TEE WITHOUT CARDIOVERSION: SHX5443

## 2014-02-27 LAB — CBC
HEMATOCRIT: 23.4 % — AB (ref 39.0–52.0)
Hemoglobin: 8 g/dL — ABNORMAL LOW (ref 13.0–17.0)
MCH: 34 pg (ref 26.0–34.0)
MCHC: 34.2 g/dL (ref 30.0–36.0)
MCV: 99.6 fL (ref 78.0–100.0)
Platelets: 158 10*3/uL (ref 150–400)
RBC: 2.35 MIL/uL — ABNORMAL LOW (ref 4.22–5.81)
RDW: 15.9 % — ABNORMAL HIGH (ref 11.5–15.5)
WBC: 8 10*3/uL (ref 4.0–10.5)

## 2014-02-27 LAB — BASIC METABOLIC PANEL
Anion gap: 13 (ref 5–15)
BUN: 12 mg/dL (ref 6–23)
CHLORIDE: 106 meq/L (ref 96–112)
CO2: 21 mEq/L (ref 19–32)
Calcium: 8.3 mg/dL — ABNORMAL LOW (ref 8.4–10.5)
Creatinine, Ser: 1.64 mg/dL — ABNORMAL HIGH (ref 0.50–1.35)
GFR calc non Af Amer: 55 mL/min — ABNORMAL LOW (ref >90)
GFR, EST AFRICAN AMERICAN: 63 mL/min — AB (ref >90)
Glucose, Bld: 88 mg/dL (ref 70–99)
Potassium: 4.3 mEq/L (ref 3.7–5.3)
Sodium: 140 mEq/L (ref 137–147)

## 2014-02-27 SURGERY — ECHOCARDIOGRAM, TRANSESOPHAGEAL
Anesthesia: Moderate Sedation

## 2014-02-27 MED ORDER — LIDOCAINE HCL 1 % IJ SOLN
INTRAMUSCULAR | Status: AC
Start: 2014-02-27 — End: 2014-02-28
  Filled 2014-02-27: qty 20

## 2014-02-27 MED ORDER — FENTANYL CITRATE 0.05 MG/ML IJ SOLN
INTRAMUSCULAR | Status: DC | PRN
  Administered 2014-02-27: 50 ug via INTRAVENOUS
  Administered 2014-02-27: 25 ug via INTRAVENOUS

## 2014-02-27 MED ORDER — FENTANYL CITRATE 0.05 MG/ML IJ SOLN
INTRAMUSCULAR | Status: AC
  Filled 2014-02-27: qty 2

## 2014-02-27 MED ORDER — DIPHENHYDRAMINE HCL 50 MG/ML IJ SOLN
INTRAMUSCULAR | Status: AC
  Filled 2014-02-27: qty 1

## 2014-02-27 MED ORDER — DIPHENHYDRAMINE HCL 50 MG/ML IJ SOLN
INTRAMUSCULAR | Status: DC | PRN
  Administered 2014-02-27: 25 mg via INTRAVENOUS

## 2014-02-27 MED ORDER — LIDOCAINE VISCOUS 2 % MT SOLN
OROMUCOSAL | Status: AC
  Filled 2014-02-27: qty 15

## 2014-02-27 MED ORDER — LIDOCAINE VISCOUS 2 % MT SOLN
OROMUCOSAL | Status: DC | PRN
  Administered 2014-02-27: 20 mL via OROMUCOSAL

## 2014-02-27 MED ORDER — MIDAZOLAM HCL 2 MG/2ML IJ SOLN
INTRAMUSCULAR | Status: AC | PRN
  Administered 2014-02-27: 1 mg via INTRAVENOUS

## 2014-02-27 MED ORDER — SODIUM CHLORIDE 0.9 % IV BOLUS (SEPSIS)
500.0000 mL | Freq: Once | INTRAVENOUS | Status: AC
  Administered 2014-02-27: 500 mL via INTRAVENOUS

## 2014-02-27 MED ORDER — MIDAZOLAM HCL 5 MG/ML IJ SOLN
INTRAMUSCULAR | Status: AC
  Filled 2014-02-27: qty 2

## 2014-02-27 MED ORDER — SODIUM CHLORIDE 0.9 % IV SOLN
INTRAVENOUS | Status: DC
  Administered 2014-02-27: 500 mL via INTRAVENOUS

## 2014-02-27 MED ORDER — BUTAMBEN-TETRACAINE-BENZOCAINE 2-2-14 % EX AERO
INHALATION_SPRAY | CUTANEOUS | Status: DC | PRN
  Administered 2014-02-27: 2 via TOPICAL

## 2014-02-27 MED ORDER — MIDAZOLAM HCL 2 MG/2ML IJ SOLN
INTRAMUSCULAR | Status: AC
  Filled 2014-02-27: qty 2

## 2014-02-27 MED ORDER — MIDAZOLAM HCL 10 MG/2ML IJ SOLN
INTRAMUSCULAR | Status: DC | PRN
  Administered 2014-02-27: 2 mg via INTRAVENOUS
  Administered 2014-02-27 (×2): 1 mg via INTRAVENOUS

## 2014-02-27 MED ORDER — FENTANYL CITRATE 0.05 MG/ML IJ SOLN
INTRAMUSCULAR | Status: AC | PRN
  Administered 2014-02-27 (×2): 25 ug via INTRAVENOUS

## 2014-02-27 NOTE — Procedures (Signed)
Successful RT IJ POWER PORT REMOVAL WOUND PACKED WET TO DRY NO COMP STABLE FULL REPORT IN PACS

## 2014-02-27 NOTE — Progress Notes (Signed)
  Echocardiogram Echocardiogram Transesophageal has been performed.  Thomas [name] 02/27/2014, 10:52 AM

## 2014-02-27 NOTE — Progress Notes (Addendum)
[facility] for Infectious Disease    Date of Admission:  02/23/2014   Total days of antibiotics 4        Day 3 Ancef        Day 3 Vancomycin (discontinued 02/26/14)         Principal Problem:   Staphylococcus aureus bacteremia Active Problems:   HIV disease   Hodgkin's disease   Hypokalemia   Thrombocytopenia   Acute renal failure   Shingles rash   Metabolic acidosis, normal anion gap (NAG)   . calcium carbonate  800 mg of elemental calcium Oral TID  .  ceFAZolin (ANCEF) IV  2 g Intravenous 3 times per day  . clotrimazole-betamethasone   Topical BID  . feeding supplement (ENSURE COMPLETE)  237 mL Oral TID BM  . ferrous sulfate  325 mg Oral BID WC    Subjective: No complaints. Slightly fatigued after TEE.   Past Medical History  Diagnosis Date  . HIV disease 06/25/2012  . Skin lesion 07/03/2012  . Leg pain 07/03/2012  . Sinus tachycardia 08/06/2012  . Penile cyst 08/06/2012  . Cancer     History  Substance Use Topics  . Smoking status: Former Smoker -- 1.00 packs/day for 15 years    Types: Cigarettes    Start date: 08/24/2013  . Smokeless tobacco: Never Used     Comment: quitline info, counseling  . Alcohol Use: No    History reviewed. No pertinent family history.  Allergies  Allergen Reactions  . Tylenol [Acetaminophen]     sweats    Objective: Temp:  [95.8 F (35.4 C)-98.6 F (37 C)] 98.6 F (37 C) (08/07 0940) Pulse Rate:  [64-90] 67 (08/07 0935) Resp:  [10-23] 12 (08/07 0935) BP: (89-111)/(51-79) 102/61 mmHg (08/07 0940) SpO2:  [98 %-100 %] 100 % (08/07 0935) Weight:  [140 lb 6.9 oz (63.699 kg)] 140 lb 6.9 oz (63.699 kg) (08/06 2122)  General: Pleasant 33 y/o male, alert, cooperative, no acute distress.  Skin: A few umbilicated papules on face. Scattered, chronic excoriated lesions with a few on his face and a few on his upper arm. There is one on his left lower quadrant. He says all of these are chronic  HEENT: PERRL, EOMI. Moist  mucus membranes Neck: Full range of motion without pain, supple, no lymphadenopathy or carotid bruits Lungs: Clear to ascultation bilaterally, no wheezes, rales, rhonchi Heart: Regular rate and rhythm, no murmurs.  Chest: Right upper chest Port-A-Cath site appears normal  Abdomen: Soft, non-tender, non-distended, bowel sounds present.  Extremities: No cyanosis, clubbing, or edema. Excoriated healing rash over left posterior thigh, groin and buttock. 3 pustules distally  Neurologic: Alert & oriented X3, cranial nerves II-XII intact, strength grossly intact, sensation intact to light touch   Lab Results Lab Results  Component Value Date   WBC 8.0 02/27/2014   HGB 8.0* 02/27/2014   HCT 23.4* 02/27/2014   MCV 99.6 02/27/2014   PLT 158 02/27/2014    Lab Results  Component Value Date   CREATININE 1.64* 02/27/2014   BUN 12 02/27/2014   NA 140 02/27/2014   K 4.3 02/27/2014   CL 106 02/27/2014   CO2 21 02/27/2014    Lab Results  Component Value Date   ALT 25 02/23/2014   AST 18 02/23/2014   ALKPHOS 183* 02/23/2014   BILITOT 0.3 02/23/2014      Microbiology: Recent Results (from the past 240 hour(s))  CULTURE, BLOOD (ROUTINE X  2)     Status: None   Collection Time    02/23/14  6:08 PM      Result Value Ref Range Status   Specimen Description BLOOD RIGHT ARM   Final   Special Requests BOTTLES DRAWN AEROBIC AND ANAEROBIC 10CC   Final   Culture  Setup Time     Final   Value: 02/23/2014 22:16     Performed at [facility]   Culture     Final   Value: STAPHYLOCOCCUS AUREUS     Note: RIFAMPIN AND GENTAMICIN SHOULD NOT BE USED AS SINGLE DRUGS FOR TREATMENT OF STAPH INFECTIONS. This organism is presumed to be Clindamycin resistant based on detection of inducible Clindamycin resistance.     Note: Gram Stain Report Called to,Read Back By and Verified With: ANDY BRAKE@3 :00PM ON 02/24/14 BY DANTS     Performed at [facility]   Report Status 02/26/2014 FINAL   Final   Organism ID, Bacteria  STAPHYLOCOCCUS AUREUS   Final  CULTURE, BLOOD (ROUTINE X 2)     Status: None   Collection Time    02/23/14  6:30 PM      Result Value Ref Range Status   Specimen Description BLOOD LEFT ARM   Final   Special Requests BOTTLES DRAWN AEROBIC AND ANAEROBIC 10CC   Final   Culture  Setup Time     Final   Value: 02/23/2014 22:16     Performed at [facility]   Culture     Final   Value: STAPHYLOCOCCUS AUREUS     Note: SUSCEPTIBILITIES PERFORMED ON PREVIOUS CULTURE WITHIN THE LAST 5 DAYS.     Note: Gram Stain Report Called to,Read Back By and Verified With: TY HICKS ON 02/24/2014 AT 10:44P BY WILEJ     Performed at [facility]   Report Status 02/26/2014 FINAL   Final  URINE CULTURE     Status: None   Collection Time    02/23/14  9:46 PM      Result Value Ref Range Status   Specimen Description URINE, CLEAN CATCH   Final   Special Requests NONE   Final   Culture  Setup Time     Final   Value: 02/24/2014 04:09     Performed at [facility]     Final   Value: NO GROWTH     Performed at [facility]   Culture     Final   Value: NO GROWTH     Performed at [facility]   Report Status 02/25/2014 FINAL   Final  GC/CHLAMYDIA PROBE AMP     Status: None   Collection Time    02/23/14  9:46 PM      Result Value Ref Range Status   CT Probe RNA NEGATIVE  NEGATIVE Final   GC Probe RNA NEGATIVE  NEGATIVE Final   Comment: (NOTE)                                                                                               **Normal Reference  Range: Negative**          Assay performed using the Gen-Probe APTIMA COMBO2 (R) Assay.     Acceptable specimen types for this assay include APTIMA Swabs (Unisex,     endocervical, urethral, or vaginal), first void urine, and ThinPrep     liquid based cytology samples.     Performed at [place], BLOOD (ROUTINE X 2)     Status: None   Collection Time    02/25/14 12:20 PM      Result  Value Ref Range Status   Specimen Description BLOOD LEFT ARM   Final   Special Requests BOTTLES DRAWN AEROBIC AND ANAEROBIC 10CC   Final   Culture  Setup Time     Final   Value: 02/25/2014 17:15     Performed at [facility]   Culture     Final   Value:        BLOOD CULTURE RECEIVED NO GROWTH TO DATE CULTURE WILL BE HELD FOR 5 DAYS BEFORE ISSUING A FINAL NEGATIVE REPORT     Performed at [facility]   Report Status PENDING   Incomplete  CULTURE, BLOOD (ROUTINE X 2)     Status: None   Collection Time    02/25/14 12:40 PM      Result Value Ref Range Status   Specimen Description BLOOD LEFT ARM   Final   Special Requests BOTTLES DRAWN AEROBIC ONLY 10CC   Final   Culture  Setup Time     Final   Value: 02/25/2014 17:15     Performed at [facility]   Culture     Final   Value:        BLOOD CULTURE RECEIVED NO GROWTH TO DATE CULTURE WILL BE HELD FOR 5 DAYS BEFORE ISSUING A FINAL NEGATIVE REPORT     Performed at [facility]   Report Status PENDING   Incomplete   TEE: IMPRESSION:  1. Thickened port-a-cath tip in the SVC concerning for thrombus and/or vegetation 2. No evidence for valvular endocarditis. 3. No LAA thrombus 4. LVEF 55-60% with normal wall motion RECOMMENDATIONS:  1. Catheter tip may be the source of bacteremia. Consider port-a-cath removal.  Pixie Casino, MD, [facility]  Attending Cardiologist  [facility]   Assessment: 33 y/o M w/ PMHx of HIV and Hodgkin's Lymphoma receiving chemotherapy, admitted for AKI and hypotension, found to have healing shingles w/ MSSA bacteremia. Patient has been receiving antibiotics for 4 days, vancomycin discontinued yesterday, now just on Ancef. TTE performed on 02/25/14 showed no evidence of endocarditis, however, TEE done today given the presence of port-a-cath, showed no valvular vegetations, but did show a thickened port-a-cath tip in the SVC concerning for vegetation/thrombus. Given this finding in the  presence of MSSA bacteremia, port-a-cath should be removed and PICC line placed for continued Ancef therapy. Blood cultures repeated on 02/25/14 continue to be negative, therefore antibiotics should be continued for 14 days from 02/25/14 (end date 03/11/14).  Plan: 1. Continue Ancef for MSSA 2. Removal of Port-a-cath 3. PICC line placement  Luanne Bras, MD PGY-2, Internal Medicine Pager: [phone] 02/27/2014, 10:00 AM  Attending addendum: He is doing well clinically. His TEE raise suspicion for a vegetation on the tip of his Port-A-Cath. I agree that it's best at the Port-A-Cath be removed. If his repeat blood cultures remain negative tomorrow I will go ahead and have a PICC placed in plan on treating with cefazolin through August 19.  His creatinine is coming down slowly. I will continue to  observe off of antiretroviral therapy until his renal function stabilizes. Please call Dr. Tommy Medal [phone] for any infectious disease questions this weekend.   Michel Bickers, MD [facility], [place] for Infectious Disease [facility] [phone] pager   [phone] cell 02/27/2014, 2:28 PM    observe off of antiretroviral therapy

## 2014-02-27 NOTE — CV Procedure (Signed)
    TRANSESOPHAGEAL ECHOCARDIOGRAM (TEE) NOTE  INDICATIONS: infective endocarditis  PROCEDURE:   Informed consent was obtained prior to the procedure. The risks, benefits and alternatives for the procedure were discussed and the patient comprehended these risks.  Risks include, but are not limited to, cough, sore throat, vomiting, nausea, somnolence, esophageal and stomach trauma or perforation, bleeding, low blood pressure, aspiration, pneumonia, infection, trauma to the teeth and death.    After a procedural time-out, the patient was given 4 mg versed, 75 mcg fentanyl, and 25 mg IV benadryl for moderate sedation.  The oropharynx was anesthetized 10 cc of topical 1% viscous lidocaine and 1 cetacaine spray.  The transesophageal probe was inserted in the esophagus and stomach without difficulty and multiple views were obtained.  The patient was kept under observation until the patient left the procedure room.  The patient left the procedure room in stable condition.   Agitated microbubble saline contrast was not administered.  COMPLICATIONS:    There were no immediate complications.  Findings:  1. LEFT VENTRICLE: The left ventricular wall thickness is normal.  The left ventricular cavity is normal in size. Wall motion is normal.  LVEF is 55-60%.  2. RIGHT VENTRICLE:  The right ventricle is normal in structure and function without any thrombus or masses.    3. LEFT ATRIUM:  The left atrium is normal in size without any thrombus or masses.  There is not spontaneous echo contrast ("smoke") in the left atrium consistent with a low flow state.  4. LEFT ATRIAL APPENDAGE:  The left atrial appendage is free of any thrombus or masses. The appendage has single lobes. Pulse doppler indicates moderate flow in the appendage.  5. ATRIAL SEPTUM:  The atrial septum appears intact and is free of thrombus and/or masses.  There is no evidence for interatrial shunting by color doppler and saline  microbubble.  6. RIGHT ATRIUM:  The right atrium is normal in size and function.  The tip of the port-a-cath is visualized in the SVC/RA junction and is noted to be thickened and echogenic - this is concerning for thrombus and/or vegetation.  7. MITRAL VALVE:  The mitral valve is normal in structure and function with trivial regurgitation.  There were no vegetations or stenosis.  8. AORTIC VALVE:  The aortic valve is normal in structure and function with no regurgitation.  There were no vegetations or stenosis  9. TRICUSPID VALVE:  The tricuspid valve is normal in structure and function with trivial regurgitation.  There were no vegetations or stenosis  10.  PULMONIC VALVE:  The pulmonic valve is normal in structure and function with no regurgitation.  There were no vegetations or stenosis.   11. AORTIC ARCH, ASCENDING AND DESCENDING AORTA:  There was no atherosclerosis of the ascending aorta, aortic arch, or proximal descending aorta.  12. PULMONARY VEINS: Anomalous pulmonary venous return was not noted.  13. PERICARDIUM: The pericardium appeared normal and non-thickened.  There is no pericardial effusion.  IMPRESSION:   1. Thickened port-a-cath tip in the SVC concerning for thrombus and/or vegetation 2. No evidence for valvular endocarditis. 3. No LAA thrombus 4. LVEF 55-60% with normal wall motion  RECOMMENDATIONS:    1.  Catheter tip may be the source of bacteremia. Consider port-a-cath removal.  Time Spent Directly with the Patient:  45 minutes   Pixie Casino, MD, [facility] Attending Cardiologist [facility]  02/27/2014, 9:41 AM

## 2014-02-27 NOTE — Consult Note (Signed)
Reason for Consult:Bacteremia, request port removal Consulting Radiologist: Shick Referring Physician: Algis Liming   HPI: Thomas [name] is an 33 y.o. male with hx of HIV and Lymphoma. Had port placed in 2/15 and has been doing fairly well with treatments. Has now been admitted with evidence of Staph bacteremia. TEE done shows concerned for vegetation on tip of post catheter. IR is requested to remove port. Pt denies any pain or drainage from port site. Chart, meds, labs reviewed  Past Medical History:  Past Medical History  Diagnosis Date  . HIV disease 06/25/2012  . Skin lesion 07/03/2012  . Leg pain 07/03/2012  . Sinus tachycardia 08/06/2012  . Penile cyst 08/06/2012  . Cancer     Surgical History:  Past Surgical History  Procedure Laterality Date  . 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  this after several previous I and Ds of rectal abcess.   [name] node biopsy Right 08/08/2013    Procedure: SUPRACLAVICULAR LYMPH NODE BIOPSY;  Surgeon: Gaye Pollack, MD;  Location: [place] [facility];  Service: Thoracic;  Laterality: Right;  . 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 No pertinent family history.  Social History:  reports that he has quit smoking. His smoking use included Cigarettes. He started smoking about 6 months ago. He has a 15 pack-year smoking history. He has never used smokeless tobacco. He reports that he does not drink alcohol or use illicit drugs.  Allergies:  Allergies  Allergen Reactions  . Tylenol [Acetaminophen]     sweats    Medications: Current facility-administered medications:calcium carbonate (TUMS - dosed in mg elemental calcium) chewable tablet 800 mg of elemental calcium, 800 mg of elemental calcium, Oral, TID, Jared M Gardner, DO, 800 mg of elemental calcium at 02/26/14 2132;  ceFAZolin (ANCEF) IVPB 2 g/50 mL premix, 2 g, Intravenous, 3 times per day, Rocky Crafts Hammons, RPH, 2 g at 02/27/14 4580 clotrimazole-betamethasone (LOTRISONE) cream, , Topical, BID, Jared M Gardner, DO;  feeding supplement (ENSURE COMPLETE) (ENSURE COMPLETE) liquid 237 mL, 237 mL, Oral, TID BM, Heather Cornelison Pitts, RD, 237 mL at 02/26/14 2132;  ferrous sulfate tablet 325 mg, 325 mg, Oral, BID WC, Etta Quill, DO, 325 mg at 02/26/14 1616 Facility-Administered Medications Ordered in Other Encounters: sodium chloride 0.9 % injection 10 mL, 10 mL, Intravenous, PRN, Wyatt Portela, MD, 10 mL at 10/31/13 1601  ROS: See HPI for pertinent findings, otherwise complete 10 system review negative.  Physical Exam: Blood pressure 90/55, pulse 69, temperature 97.8 F (36.6 C), temperature source Oral, resp. rate 18, height 6' 0.84" (1.85 m), weight 140 lb 6.9 oz (63.699 kg), SpO2 100.00%. A&O x 3 ENT: unremarkable Lungs: CTA without w/r/r. (R)port palpable, NT, no erythema Heart: Regular     Labs: CBC  Recent Labs  02/25/14 0540 02/27/14 0700  WBC 4.5 8.0  HGB 7.8* 8.0*  HCT 23.2* 23.4*  PLT 92* 158   MET  Recent Labs  02/26/14 0530 02/27/14 0700  NA 140 140  K 3.4* 4.3  CL 104 106  CO2 25 21  GLUCOSE 90 88  BUN 13 12  CREATININE 1.72* 1.64*  CALCIUM 8.3* 8.3*      No results found.  Assessment/Plan: MSSA bacteremia with concern for vegetation on port catheter. Discussed need for port removal today Explained procedure, risks, complications, use of sedation Labs reviewed. Consent signed in chart  Ascencion Dike PA-C  02/27/2014, 12:32 PM

## 2014-02-27 NOTE — H&P (Signed)
     INTERVAL PROCEDURE H&P  History and Physical Interval Note:  02/27/2014 8:57 AM  Thomas [name] has presented today for their planned procedure. The various methods of treatment have been discussed with the patient and family. After consideration of risks, benefits and other options for treatment, the patient has consented to the procedure.  The patients' outpatient history has been reviewed, patient examined, and no change in status from most recent office note within the past 30 days. I have reviewed the patients' chart and labs and will proceed as planned. Questions were answered to the patient's satisfaction.   Pixie Casino, MD, [facility] Attending Cardiologist [facility]  Jaquon Gingerich C 02/27/2014, 8:57 AM

## 2014-02-27 NOTE — Progress Notes (Addendum)
PROGRESS NOTE    Thomas [name]:[id number] DOB: 06/16/1981 DOA: 02/23/2014 PCP: Philis Fendt, MD  HPI/Brief narrative 33 y/o male patient with h/o HIV (follows with Dr. Linus Salmons), Hodgkin's Lymphoma on chemo - last 2 weeks ago(follows with Dr. Alen Blew), admitted on 8/3 with worsening pruritis of left side groin and thigh rash that he had for 5-7 days PTA and was not getting better with home remedies including streoid cream. Last CD4 in April was 360, viral load undetectable at that time. 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 He has MSSA bacteremia- etiology- ? Shingles rash vs port. On Ancef. 2 D Echo neg. Requesting Card for TEE.    Assessment/Plan:  1. Acute on Chronic Renal Failure: unclear etiology. States no GI symptoms and appetite good. Denies NSAID's. ? ATN from hypotension. Renal US shows stable right hydronephrosis. Uric acid and CK normal. Chemo meds- less likely etiology. Baseline creat: fluctuating: ? 1.6-1.9.  Resolved after IV fluids. Creatinine back to baseline. DC'ed IV fluids. 2. Non AG metabolic acidosis: Unclear etiology. Has had this on reviewing past labs earlier this year.? Secondary to HIV medications. Resolved after IV bicarbonate infusion- DC'ed.  3. MSSA bacteremia secondary to infected Port-A-Cath: 2 of 2 blood cultures positive. Source-? From infected shingles versus Port a cath. Even though he has bacteremia, do not believe that his initial presentation was consistent with sepsis- he has chronic asymptomatic hypotension. 2 D Echo negative for vegetations. Initially was empirically on Van and Ancef > now narrowed to Ancef. ID follow up appreciated. Surveillance blood culture 8/5: neg to date.  TEE 8/7 shows thickened port a cath tip in the SVC concerning for thrombus and or vegetation-DC Port-A-Cath 8/7 (IR consulted). Continue Ancef via peripheral IV. If surveillance cultures continued to be negative,  , PICC line on 8/8 for prolonged IV antibiotics and outpatient chemotherapy. Discussed with ID and oncology. 4. Hypotension: apparently chronic and asymptomatic. 5. Shingles/Skin rash: over left side of groin, thigh and buttock. Evaluated by ID and consistent with shingles involving S1 dermatome-all lesions mostly dried up-no isolation or treatment recommended at this time. 6. HIV: meds held by ID. 7. Hodgkin's disease: next chemo was due on 02/27/14. Dr. Alen Blew input appreciated-outpatient followup regarding chemotherapy. 8. Hypokalemia: replaced 9. Chronic Anemia: Likely 2/2 to chronic disease, HIV, lymphoma and Rx. No reported bleeding. Transfuse if Hb < 7. Stable. 10. Thrombocytopenia: chronic and stable. 11. Severe malnutrition in the context of chronic illness & Underweight: Nutrition consult appreciated- Mx per them.     Code Status: Full Family Communication: None at bedside. Left message for mother. Disposition Plan: DC home when medically stable.   Consultants:  Infectious disease  Cardiology- for TEE  Procedures:  TEE 8/7 IMPRESSION:  1. Thickened port-a-cath tip in the SVC concerning for thrombus and/or vegetation 2. No evidence for valvular endocarditis. 3. No LAA thrombus 4. LVEF 55-60% with normal wall motion RECOMMENDATIONS:  1. Catheter tip may be the source of bacteremia. Consider port-a-cath removal.   Antibiotics:  Vancomycin- DC'ed  Ancef  Subjective: Patient denies complaints.  Objective: Filed Vitals:   02/27/14 1010 02/27/14 1020 02/27/14 1030 02/27/14 1100  BP: 99/59 99/59 105/64 90/55  Pulse: 63 64 71 69  Temp:    97.8 F (36.6 C)  TempSrc:    Oral  Resp: 17 18 18 18   Height:      Weight:  SpO2: 100% 100% 100% 100%    Intake/Output Summary (Last 24 hours) at 02/27/14 1144 Last data filed at 02/27/14 1102  Gross per 24 hour  Intake    240 ml  Output   2750 ml  Net  -2510 ml   Filed Weights   02/23/14 2309 02/25/14 2100  02/26/14 2122  Weight: 60.9 kg (134 lb 4.2 oz) 63.7 kg (140 lb 6.9 oz) 63.699 kg (140 lb 6.9 oz)     Exam:  General exam: Pleasant young male lying comfortably in bed. Does not look toxic or septic. Respiratory system: Clear. No increased work of breathing. Cardiovascular system: S1 & S2 heard, RRR. No JVD, murmurs, gallops, clicks or pedal edema. Gastrointestinal system: Abdomen is nondistended, soft and nontender. Normal bowel sounds heard. Central nervous system: Alert and oriented. No focal neurological deficits. Extremities: Symmetric 5 x 5 power. Skin: Excoriated drying skin rash over left groin, extending to left posterior thing, left buttock and hip.   Data Reviewed: Basic Metabolic Panel:  Recent Labs Lab 02/24/14 0340 02/24/14 1715 02/25/14 0540 02/26/14 0530 02/27/14 0700  NA 135* 136* 140 140 140  K 3.3* 3.3* 3.4* 3.4* 4.3  CL 105 107 113* 104 106  CO2 16* 15* 15* 25 21  GLUCOSE 90 96 92 90 88  BUN 43* 30* 22 13 12   CREATININE 3.24* 2.61* 2.23* 1.72* 1.64*  CALCIUM 7.9* 7.9* 8.0* 8.3* 8.3*   Liver Function Tests:  Recent Labs Lab 02/23/14 1722  AST 18  ALT 25  ALKPHOS 183*  BILITOT 0.3  PROT 7.9  ALBUMIN 3.2*   No results found for this basename: LIPASE, AMYLASE,  in the last 168 hours No results found for this basename: AMMONIA,  in the last 168 hours CBC:  Recent Labs Lab 02/23/14 1722 02/24/14 0340 02/25/14 0540 02/27/14 0700  WBC 6.3 4.9 4.5 8.0  NEUTROABS 5.1  --   --   --   HGB 9.9* 7.8* 7.8* 8.0*  HCT 28.5* 22.7* 23.2* 23.4*  MCV 97.9 95.8 100.0 99.6  PLT 88* 94* 92* 158   Cardiac Enzymes:  Recent Labs Lab 02/24/14 1715  CKTOTAL 74   BNP (last 3 results) No results found for this basename: PROBNP,  in the last 8760 hours CBG: No results found for this basename: GLUCAP,  in the last 168 hours  Recent Results (from the past 240 hour(s))  CULTURE, BLOOD (ROUTINE X 2)     Status: None   Collection Time    02/23/14  6:08 PM        Result Value Ref Range Status   Specimen Description BLOOD RIGHT ARM   Final   Special Requests BOTTLES DRAWN AEROBIC AND ANAEROBIC 10CC   Final   Culture  Setup Time     Final   Value: 02/23/2014 22:16     Performed at [facility]   Culture     Final   Value: STAPHYLOCOCCUS AUREUS     Note: RIFAMPIN AND GENTAMICIN SHOULD NOT BE USED AS SINGLE DRUGS FOR TREATMENT OF STAPH INFECTIONS. This organism is presumed to be Clindamycin resistant based on detection of inducible Clindamycin resistance.     Note: Gram Stain Report Called to,Read Back By and Verified With: ANDY BRAKE@3 :00PM ON 02/24/14 BY DANTS     Performed at [facility]   Report Status 02/26/2014 FINAL   Final   Organism ID, Bacteria STAPHYLOCOCCUS AUREUS   Final  CULTURE, BLOOD (ROUTINE X 2)  Status: None   Collection Time    02/23/14  6:30 PM      Result Value Ref Range Status   Specimen Description BLOOD LEFT ARM   Final   Special Requests BOTTLES DRAWN AEROBIC AND ANAEROBIC 10CC   Final   Culture  Setup Time     Final   Value: 02/23/2014 22:16     Performed at [facility]   Culture     Final   Value: STAPHYLOCOCCUS AUREUS     Note: SUSCEPTIBILITIES PERFORMED ON PREVIOUS CULTURE WITHIN THE LAST 5 DAYS.     Note: Gram Stain Report Called to,Read Back By and Verified With: TY HICKS ON 02/24/2014 AT 10:44P BY WILEJ     Performed at [facility]   Report Status 02/26/2014 FINAL   Final  URINE CULTURE     Status: None   Collection Time    02/23/14  9:46 PM      Result Value Ref Range Status   Specimen Description URINE, CLEAN CATCH   Final   Special Requests NONE   Final   Culture  Setup Time     Final   Value: 02/24/2014 04:09     Performed at [facility] Count     Final   Value: NO GROWTH     Performed at [facility]   Culture     Final   Value: NO GROWTH     Performed at [facility]   Report Status 02/25/2014 FINAL   Final  GC/CHLAMYDIA  PROBE AMP     Status: None   Collection Time    02/23/14  9:46 PM      Result Value Ref Range Status   CT Probe RNA NEGATIVE  NEGATIVE Final   GC Probe RNA NEGATIVE  NEGATIVE Final   Comment: (NOTE)                                                                                               **Normal Reference Range: Negative**          Assay performed using the Gen-Probe APTIMA COMBO2 (R) Assay.     Acceptable specimen types for this assay include APTIMA Swabs (Unisex,     endocervical, urethral, or vaginal), first void urine, and ThinPrep     liquid based cytology samples.     Performed at [place], BLOOD (ROUTINE X 2)     Status: None   Collection Time    02/25/14 12:20 PM      Result Value Ref Range Status   Specimen Description BLOOD LEFT ARM   Final   Special Requests BOTTLES DRAWN AEROBIC AND ANAEROBIC 10CC   Final   Culture  Setup Time     Final   Value: 02/25/2014 17:15     Performed at [facility]   Culture     Final   Value:        BLOOD CULTURE RECEIVED NO GROWTH TO DATE CULTURE WILL BE HELD FOR 5 DAYS BEFORE ISSUING A FINAL NEGATIVE REPORT  Performed at [facility]   Report Status PENDING   Incomplete  CULTURE, BLOOD (ROUTINE X 2)     Status: None   Collection Time    02/25/14 12:40 PM      Result Value Ref Range Status   Specimen Description BLOOD LEFT ARM   Final   Special Requests BOTTLES DRAWN AEROBIC ONLY 10CC   Final   Culture  Setup Time     Final   Value: 02/25/2014 17:15     Performed at [facility]   Culture     Final   Value:        BLOOD CULTURE RECEIVED NO GROWTH TO DATE CULTURE WILL BE HELD FOR 5 DAYS BEFORE ISSUING A FINAL NEGATIVE REPORT     Performed at [facility]   Report Status PENDING   Incomplete       Studies: No results found.      Scheduled Meds: . calcium carbonate  800 mg of elemental calcium Oral TID  .  ceFAZolin (ANCEF) IV  2 g Intravenous 3 times per day  .  clotrimazole-betamethasone   Topical BID  . feeding supplement (ENSURE COMPLETE)  237 mL Oral TID BM  . ferrous sulfate  325 mg Oral BID WC   Continuous Infusions:    Principal Problem:   Staphylococcus aureus bacteremia Active Problems:   HIV disease   Hodgkin's disease   Hypokalemia   Thrombocytopenia   Acute renal failure   Shingles rash   Metabolic acidosis, normal anion gap (NAG)    Time spent: 30 minutes.    Vernell Leep, MD, FACP, FHM. [facility] Pager [phone]  If 7PM-7AM, please contact night-coverage [email or web address] Password [email or web address] 02/27/2014, 11:44 AM    LOS: 4 days

## 2014-02-27 NOTE — Progress Notes (Addendum)
BP 85/50, HR 104. Asymptomatic. Notified on call; orders for 500 cc bolus received and administered.  BP rechecked and was 88/57, HR 86. Currently, asymptomatic. Notified on call NP; no new orders received.  Will continue to monitor.

## 2014-02-28 ENCOUNTER — Ambulatory Visit: Payer: Medicare Other

## 2014-02-28 ENCOUNTER — Inpatient Hospital Stay (HOSPITAL_COMMUNITY): Payer: Medicare Other

## 2014-02-28 DIAGNOSIS — R200 Anesthesia of skin: Secondary | ICD-10-CM | POA: Diagnosis not present

## 2014-02-28 DIAGNOSIS — R209 Unspecified disturbances of skin sensation: Secondary | ICD-10-CM

## 2014-02-28 MED ORDER — GADOBENATE DIMEGLUMINE 529 MG/ML IV SOLN
13.0000 mL | Freq: Once | INTRAVENOUS | Status: AC | PRN
  Administered 2014-02-28: 13 mL via INTRAVENOUS
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
## 2014-02-28 NOTE — Consult Note (Signed)
Neurology Consultation Reason for Consult: Left leg numbness Referring Physician: Hongalgi, A  CC: Left leg numbness  History is obtained from: Patient  HPI: Thomas [name] is a 33 y.o. male who was in the hospital currently for bacteremia and also had a recent shingles outbreak on his left leg who presents with left leg numbness. He also states that he has had some difficulty walking over the past couple of weeks. He states that prior to this, he was walking normally.  He states that he has had some numbness in both legs for a very long period of time, though it started after his HIV was diagnosed 3 years ago. He also has Hodgkin's lymphoma which has been treated with ABVD.    ROS: A 14 point ROS was performed and is negative except as noted in the HPI.   Past Medical History  Diagnosis Date  . HIV disease 06/25/2012  . Skin lesion 07/03/2012  . Leg pain 07/03/2012  . Sinus tachycardia 08/06/2012  . Penile cyst 08/06/2012  . Cancer     Family History: Diabetes  Social History: Tob: Denies Denies any drug use or alcohol use  Exam: Current vital signs: BP 92/58  Pulse 82  Temp(Src) 98.4 F (36.9 C) (Oral)  Resp 16  Ht 6' 0.83" (1.85 m)  Wt 62.143 kg (137 lb)  BMI 18.16 kg/m2  SpO2 100% Vital signs in last 24 hours: Temp:  [97.7 F (36.5 C)-98.5 F (36.9 C)] 98.4 F (36.9 C) (08/08 1037) Pulse Rate:  [74-104] 82 (08/08 1037) Resp:  [13-22] 16 (08/08 1037) BP: (85-101)/(50-61) 92/58 mmHg (08/08 1037) SpO2:  [96 %-100 %] 100 % (08/08 1037) Weight:  [62.143 kg (137 lb)] 62.143 kg (137 lb) (08/07 2100)  General: In bed, NAD CV: Regular rate and rhythm Mental Status: Patient is awake, alert, oriented to person, place, month, year, and situation. Immediate and remote memory are intact. Patient is able to give a clear and coherent history. No signs of aphasia or neglect Cranial Nerves: II: Visual Fields are full. Pupils are equal, round, and reactive to light.   Discs are difficult to visualize. III,IV, VI: EOMI without ptosis or diploplia.  V: Facial sensation is symmetric to temperature VII: Facial movement is symmetric.  VIII: hearing is intact to voice X: Uvula elevates symmetrically XI: Shoulder shrug is symmetric. XII: tongue is midline without atrophy or fasciculations.  Motor: Tone is normal. Bulk is normal. 4+/5 strength was present in all four extremities.  Sensory: Sensation is diminished in the right distally to light touch and temperature. On the left throughout his left leg up to his thigh he has a concentric decrease sensation. Deep Tendon Reflexes: 2+ and symmetric in the biceps and patellae.  Plantars: Toes are downgoing bilaterally.  Cerebellar: FNF intact bilaterally Gait: Unsteady with standing.    I have reviewed labs in epic and the results pertinent to this consultation are: Positive blood cultures  Impression: 33 year old male with a history of HIV, bacteremia, recent zoster infection who presents with left leg numbness. My suspicion is that this could represent plexus involvement of the zoster, however would need to rule out spinal epidural abscess, neoplastic involvement of the plexus.  Recommendations: 1) MRI T. and L. Spine including left lumbosacral plexus with/without contrast 2) will continue to follow    Roland Rack, MD [facility] [phone]  If 7pm- 7am, please page neurology on call as listed in [place].

## 2014-02-28 NOTE — Progress Notes (Signed)
Lab notified RN for positive blood culture for gram negative rods.  Notified NP on call.

## 2014-03-01 ENCOUNTER — Inpatient Hospital Stay (HOSPITAL_COMMUNITY): Payer: Medicare Other

## 2014-03-01 DIAGNOSIS — N1330 Unspecified hydronephrosis: Secondary | ICD-10-CM

## 2014-03-01 DIAGNOSIS — R839 Unspecified abnormal finding in cerebrospinal fluid: Secondary | ICD-10-CM

## 2014-03-01 DIAGNOSIS — N201 Calculus of ureter: Secondary | ICD-10-CM

## 2014-03-01 DIAGNOSIS — Z21 Asymptomatic human immunodeficiency virus [HIV] infection status: Secondary | ICD-10-CM

## 2014-03-01 HISTORY — DX: Unspecified hydronephrosis: N13.30

## 2014-03-01 LAB — CULTURE, BLOOD (ROUTINE X 2)

## 2014-03-01 LAB — PROTIME-INR
INR: 1.16 (ref 0.00–1.49)
PROTHROMBIN TIME: 14.8 s (ref 11.6–15.2)

## 2014-03-01 LAB — PROTEIN AND GLUCOSE, CSF
Glucose, CSF: 40 mg/dL — ABNORMAL LOW (ref 43–76)
Total  Protein, CSF: 84 mg/dL — ABNORMAL HIGH (ref 15–45)

## 2014-03-01 LAB — GRAM STAIN

## 2014-03-01 LAB — APTT: aPTT: 42 seconds — ABNORMAL HIGH (ref 24–37)

## 2014-03-01 LAB — CSF CELL COUNT WITH DIFFERENTIAL
RBC Count, CSF: 0 /mm3
TUBE #: 1
WBC, CSF: 8 /mm3 — ABNORMAL HIGH (ref 0–5)

## 2014-03-01 MED ORDER — FENTANYL CITRATE 0.05 MG/ML IJ SOLN
INTRAMUSCULAR | Status: AC
  Filled 2014-03-01: qty 4

## 2014-03-01 MED ORDER — SODIUM CHLORIDE 0.9 % IV BOLUS (SEPSIS)
500.0000 mL | Freq: Once | INTRAVENOUS | Status: AC
  Administered 2014-03-01: 500 mL via INTRAVENOUS

## 2014-03-01 MED ORDER — ZOLPIDEM TARTRATE 5 MG PO TABS
5.0000 mg | ORAL_TABLET | Freq: Once | ORAL | Status: AC
  Administered 2014-03-02: 5 mg via ORAL
  Filled 2014-03-01: qty 1

## 2014-03-01 MED ORDER — IOHEXOL 300 MG/ML  SOLN
50.0000 mL | Freq: Once | INTRAMUSCULAR | Status: AC | PRN
  Administered 2014-03-01: 20 mL

## 2014-03-01 MED ORDER — MIDAZOLAM HCL 2 MG/2ML IJ SOLN
INTRAMUSCULAR | Status: AC
  Filled 2014-03-01: qty 6

## 2014-03-01 MED ORDER — MIDAZOLAM HCL 2 MG/2ML IJ SOLN
INTRAMUSCULAR | Status: AC | PRN
  Administered 2014-03-01 (×2): 1 mg via INTRAVENOUS

## 2014-03-01 MED ORDER — OXYCODONE HCL 5 MG PO TABS
5.0000 mg | ORAL_TABLET | ORAL | Status: DC | PRN
  Administered 2014-03-02: 5 mg via ORAL
  Filled 2014-03-01: qty 1

## 2014-03-01 MED ORDER — FENTANYL CITRATE 0.05 MG/ML IJ SOLN
INTRAMUSCULAR | Status: AC | PRN
  Administered 2014-03-01 (×2): 50 ug via INTRAVENOUS

## 2014-03-01 MED ORDER — LIDOCAINE HCL 1 % IJ SOLN
INTRAMUSCULAR | Status: AC
  Filled 2014-03-01: qty 20

## 2014-03-01 MED ORDER — SODIUM CHLORIDE 0.9 % IV SOLN
INTRAVENOUS | Status: AC
  Administered 2014-03-01 – 2014-03-02 (×2): via INTRAVENOUS

## 2014-03-01 MED ORDER — DEXTROSE 5 % IV SOLN
1.0000 g | Freq: Once | INTRAVENOUS | Status: AC
  Administered 2014-03-01: 1 g via INTRAVENOUS
  Filled 2014-03-01: qty 10

## 2014-03-01 NOTE — Sedation Documentation (Signed)
LP in process, pt tolerating well.

## 2014-03-01 NOTE — Sedation Documentation (Signed)
Successful R nephro tube placed- dsg intact

## 2014-03-01 NOTE — Sedation Documentation (Signed)
Starting R nephro tube procedure-

## 2014-03-01 NOTE — Procedures (Signed)
Post-Procedure Note  Pre-operative Diagnosis:  Right hydronephrosis, right ureter stone.  History of HIV and lymphoma       Post-operative Diagnosis: Same   Indications: Right ureteral stone and right hydronephrosis.  Procedure Details:  1) Fluoroscopic guided LP performed.  Removed 10 ml of clear CSF and opening pressure was 20 cm. 2)  Right percutaneous nephrostomy.  Placed 10.2 French drain without complication.  Findings: Opening pressure 20 cm.    Cloudy fluid removed from right kidney and fluid sent for culture.   Complications: None     Condition: Stable  Plan: Follow right kidney drainage.  Urology and Neurology following urine and CSF fluid tests.

## 2014-03-01 NOTE — Progress Notes (Signed)
Subjective: No changes. Patient examined as he was being taken down for IR procedure.   Exam: Filed Vitals:   03/01/14 1422  BP: 92/59  Pulse: 76  Temp:   Resp: 16   Gen: In bed, NAD MS: Awake, alert,  MM:NOTRR, EOMI Sensory:decreasesed circumferentially in left leg.  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 Also possible would be VZV involvement. I think that an LP was VZV PCR as well as cytology would be helpful.  Recommendations: 1) LP with VZV PCR, cytology, cells, protein glucose 2) will continue to follow.   Roland Rack, MD [facility] [phone]  If 7pm- 7am, please page neurology on call as listed in [place].

## 2014-03-01 NOTE — Sedation Documentation (Signed)
Dr. Anselm Pancoast requested to do LP on pt. Will do that now prior to Nephrostomy tube. No sedation for LP procedure per MD

## 2014-03-01 NOTE — Progress Notes (Signed)
Reviewed and agree  Tonnya Garbett, PT  Acute [facility] Services Pager [phone] Office [phone]  

## 2014-03-01 NOTE — Progress Notes (Signed)
Series of manual blood pressure done,MD on call made aware,orders done and carried out.

## 2014-03-01 NOTE — Progress Notes (Signed)
Patient ID: Thomas [name], male   DOB: Sep 21, 1980, 33 y.o.   MRN: [id number] Request received from Dr. Alinda Money for right percutaneous nephrostomy tube placement in pt. Thomas [name] is a 33 y.o. with HIV and lympoma who was admitted to the hospital on 02/23/14 with complaints of a cutaneous infection of the left groin. He is followed by Dr. Alen Blew for his lymphoma and has been receiving systemic chemotherapy. He has recent MSSA bacteremia which was found to be related to a likely port-a-cath infection and it has since been removed. He also has one blood culture that was positive for gram negative rods although other blood cultures this admission have not grown out gram negative rods. He also has had an AKI with his Cr having increased to 3.24 upon admission although it has decreased back to 1.6 which appears to be consistent with his baseline. Evaluation of his AKI led to a renal ultrasound demonstrated right hydronephrosis which was noted to be chronic on prior imaging and an MRI of the spine (performed due to bilateral lower back pain and lower extremity radicular symptoms) confirmed right hydroureteronephrosis down to a filling defect in the right ureter. A stone CT scan confirmed large burden 1 cm conglomeration of stones in the ureter as the etiology for obstruction. On review, he had a CT scan in January 2015 which demonstrated these same large obstructing stones although no urologic consultation or intervention was requested at that time. Recent imaging studies were reviewed by Dr. Anselm Pancoast. Additional hx as below. Pt had rt chest wall PAC removed by our service on 8/7 secondary to infection concern. Exam: pt awake/alert; chest- CTA bilat; prev rt chest wall port site soft, mod tender with some edema/?hematoma noted - dressing in place; heart- RRR; abd- soft,+BS,NT; mild LBP noted; ext- no edema.   Filed Vitals:   02/28/14 1701 02/28/14 2052 03/01/14 0558 03/01/14 0914  BP: 92/58 76/46 79/42  90/57  Pulse: 84  100 91 92  Temp: 98.7 F (37.1 C) 98.1 F (36.7 C) 98.3 F (36.8 C) 97.5 F (36.4 C)  TempSrc: Oral Oral Oral Oral  Resp: 16 16 17 16   Height:      Weight:  132 lb 11.2 oz (60.192 kg)    SpO2: 99% 99% 100% 100%   Past Medical History  Diagnosis Date  . HIV disease 06/25/2012  . Skin lesion 07/03/2012  . Leg pain 07/03/2012  . Sinus tachycardia 08/06/2012  . Penile cyst 08/06/2012  . Cancer    Past Surgical History  Procedure Laterality Date  . 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  this after several previous I and Ds of rectal abcess.   [name] node biopsy Right 08/08/2013    Procedure: SUPRACLAVICULAR LYMPH NODE BIOPSY;  Surgeon: Gaye Pollack, MD;  Location: [facility];  Service: Thoracic;  Laterality: Right;  . 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 Renal failure. History of lymphoma.  EXAM: CT ABDOMEN AND PELVIS WITHOUT CONTRAST  TECHNIQUE: Multidetector CT imaging of the abdomen and pelvis was performed following the standard protocol without IV contrast.  COMPARISON:  Renal ultrasound on 02/23/2014 and PET scan on 12/10/2013.  FINDINGS: There is evidence significant right-sided hydronephrosis and hydroureter as visualized on the recent renal ultrasound study. [place] ureter is followed into the pelvis, there is a column  of 3 adjacent significant sized calculi located roughly at the level of the mid sacrum. All 3 of the calculi measure approximately 10 mm in greatest diameter.  There are also a number of intrarenal calculi bilaterally. On the right side a 7 mm calculus is located in the lower pole collecting system. In the left kidney, the largest calculus measures 8 mm in the upper pole. There are at least 8 other smaller calculi dispersed throughout the left collecting system measuring on the order of  roughly 2-3 mm each in size. No left-sided hydronephrosis or ureteral calculi on the left.  Unenhanced appearance of the liver, spleen, pancreas, adrenal glands and bowel are unremarkable. Layering higher density in the gallbladder lumen likely relates to calculi. No evidence of gallbladder distention, inflammation or biliary dilatation.  The bladder is unremarkable. No free fluid or abscess is identified. There are some residual mildly prominent retroperitoneal lymph nodes identified which are similar in size compared to the prior PET scan and most prominent in the left periaortic region. Largest measures approximately 11 mm in short axis. No bony abnormalities are seen.  IMPRESSION: 1. Significant right-sided hydronephrosis and hydroureter secondary to a column of three roughly 10 mm diameter calculi stacked on top of one another in the distal ureter at the level of the mid sacrum. 2. Bilateral intrarenal calculi with single calculus in the lower pole of the right kidney and multiple calculi in the left kidney. The left kidney demonstrates no evidence of hydronephrosis. 3. Some residual mildly prominent left para-aortic retroperitoneal lymph nodes similar in size to the PET scan in May.   Electronically Signed   By: Aletta Edouard M.D.   On: 03/01/2014 08:55   Dg Chest 2 View  02/23/2014   CLINICAL DATA:  Wound check.  Hypotension  EXAM: CHEST  2 VIEW  COMPARISON:  02/23/2014  FINDINGS: Heart size and vascularity are normal. Lungs are clear without infiltrate or effusion. Port-A-Cath tip in the SVC.  IMPRESSION: No active cardiopulmonary disease.   Electronically Signed   By: Franchot Gallo M.D.   On: 02/23/2014 18:59   Ct Head Wo Contrast  03/01/2014   CLINICAL DATA:  33 year old male with left lower extremity weakness. 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 Initial encounter.  EXAM: CT HEAD WITHOUT CONTRAST  TECHNIQUE: Contiguous axial images were obtained from the base of the skull through the vertex  without intravenous contrast.  COMPARISON:  Head CT without contrast 08/02/2013. Thoracic spine MRI 02/28/2014.  FINDINGS: Visualized paranasal sinuses and mastoids are clear. No osseous abnormality identified. Visualized orbits and scalp soft tissues are within normal limits.  Stable cerebral volume. No midline shift, mass effect, or evidence of intracranial mass lesion. Stable, normal ventricular size and configuration. Stable, normal basilar cisterns. No acute intracranial hemorrhage identified. Gray-white matter differentiation is within normal limits throughout the brain. No evidence of cortically based acute infarction identified. No suspicious intracranial vascular hyperdensity.  IMPRESSION: Stable and normal noncontrast CT appearance of the brain. No contraindication to lumbar puncture identified.   Electronically Signed   By: Lars Pinks M.D.   On: 03/01/2014 11:39   Mr Thoracic Spine W Wo Contrast  02/28/2014   CLINICAL DATA:  33 year old male with history of HIV, lymphoma, bacteremia. Left lower extremity numbness.  EXAM: MRI THORACIC SPINE WITHOUT AND WITH CONTRAST  MRI LUMBOSACRAL PLEXUS WITHOUT AND WITH CONTRAST.  TECHNIQUE: Multiplanar and multiecho pulse sequences of the thoracic spine and lumbosacral plexus were obtained without and with intravenous  contrast.  CONTRAST:  5mL MULTIHANCE GADOBENATE DIMEGLUMINE 529 MG/ML IV SOLN  COMPARISON:  PET-CT 11/2013 and earlier. CT Abdomen and Pelvis 08/04/2013  FINDINGS: MR THORACIC SPINE FINDINGS  Limited sagittal imaging of the cervical spine appears negative except for diffusely abnormal bone marrow signal, including at the visualized skullbase.  Preserved thoracic vertebral height and alignment, but diffusely abnormal bone marrow signal, heterogeneously decreased on T1 and T2 imaging. Marrow signal is heterogeneously increased on STIR, and following contrast there is widespread heterogeneous bone marrow enhancement.  Axial post-contrast images are  degraded by motion. Sagittal post-contrast images indicate multifocal abnormal enhancement along the dorsal spinal cord or dorsal surface of the spinal cord. The levels T4 through T7 are most affected in the upper spine. At the T10-T11 level there is a larger area of abnormal enhancement which appears to extend farther into the substance of the cord (series 23 image 6), in this corresponds to abnormal T2 signal within the dorsal cord (series 7, image 32).  There is also subtle abnormal dorsal column increased T2 signal in the upper cord corresponding to the areas of enhancement, T4 -T6.  No thoracic spinal cord expansion. Capacious thoracic thecal sac. No spinal stenosis. Conus medullaris is not included on these images.  No epidural process in the thoracic spine.  Trace layering right pleural effusion. Mildly increased signal in the deep tendon right lower lobe. Visualized upper abdominal viscera remarkable for decreased T2 signal in the liver and spleen.  MR LUMBOSACRAL PLEXUS FINDINGS  Study is intermittently degraded by motion artifact despite repeated imaging attempts.  The entire lumbar spine is not included. Diffusely abnormal bone marrow signal. These images suggest transitional lumbosacral anatomy, uncertain when compared to the comparison exams.  Continued right hydronephrosis and right hydroureter related to an obstructing calculus at the level of the right sacrum, better demonstrated on 11/2013.  The conus medullaris is not included. Cauda equina nerve roots appear within normal limits on precontrast images. Suggestion of abnormal enhancement of the left S1 or S2 nerve roots at the L5-S1 level (series 20, image 16 and series 21, image 40. No other abnormal cauda equina enhancement identified.  Capacious lumbar spinal canal.  No spinal stenosis.  The exiting nerves of the lumbosacral plexus appear within normal limits. Mild vascular artifact related to the common iliac arteries and iliac bifurcations.   IMPRESSION: 1. 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 But there is a larger enhancing plaque at the T10-T11 level dorsally which appears to involve the substance of the cord. 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 Follow-up CSF analysis may confirm, but recommend Head CT prior to lumbar puncture to exclude intracranial mass effect. 2. Lumbosacral plexus was imaged and appears within normal limits. However, there is suggestion of abnormal enhancement of the left S1 or S2 nerve roots in the cauda equina at the L5-S1 level. 3. 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 Bone marrow treatment effect or hemosiderosis are less likely. 4. Chronic right side obstructive uropathy; moderate to severe right hydronephrosis and hydroureter due to an obstructing calculus at the level of the right sacrum.   Electronically Signed   By: Lars Pinks M.D.   On: 02/28/2014 21:59   Mr Lumbar Spine W Wo Contrast  02/28/2014   CLINICAL DATA:  33 year old male with history of HIV, lymphoma, bacteremia. Left lower extremity numbness.  EXAM: MRI THORACIC  SPINE WITHOUT AND WITH CONTRAST  MRI LUMBOSACRAL PLEXUS WITHOUT AND WITH CONTRAST.  TECHNIQUE: Multiplanar and multiecho pulse sequences of the thoracic spine and lumbosacral plexus were obtained without and with intravenous contrast.  CONTRAST:  45mL MULTIHANCE GADOBENATE DIMEGLUMINE 529 MG/ML IV SOLN  COMPARISON:  PET-CT 11/2013 and earlier. CT Abdomen and Pelvis 08/04/2013  FINDINGS: MR THORACIC SPINE FINDINGS  Limited sagittal imaging of the cervical spine appears negative except for diffusely abnormal bone marrow signal, including at the visualized skullbase.  Preserved thoracic vertebral height and alignment, but diffusely abnormal bone marrow signal, heterogeneously decreased on T1 and T2 imaging. Marrow signal is  heterogeneously increased on STIR, and following contrast there is widespread heterogeneous bone marrow enhancement.  Axial post-contrast images are degraded by motion. Sagittal post-contrast images indicate multifocal abnormal enhancement along the dorsal spinal cord or dorsal surface of the spinal cord. The levels T4 through T7 are most affected in the upper spine. At the T10-T11 level there is a larger area of abnormal enhancement which appears to extend farther into the substance of the cord (series 23 image 6), in this corresponds to abnormal T2 signal within the dorsal cord (series 7, image 32).  There is also subtle abnormal dorsal column increased T2 signal in the upper cord corresponding to the areas of enhancement, T4 -T6.  No thoracic spinal cord expansion. Capacious thoracic thecal sac. No spinal stenosis. Conus medullaris is not included on these images.  No epidural process in the thoracic spine.  Trace layering right pleural effusion. Mildly increased signal in the deep tendon right lower lobe. Visualized upper abdominal viscera remarkable for decreased T2 signal in the liver and spleen.  MR LUMBOSACRAL PLEXUS FINDINGS  Study is intermittently degraded by motion artifact despite repeated imaging attempts.  The entire lumbar spine is not included. Diffusely abnormal bone marrow signal. These images suggest transitional lumbosacral anatomy, uncertain when compared to the comparison exams.  Continued right hydronephrosis and right hydroureter related to an obstructing calculus at the level of the right sacrum, better demonstrated on 11/2013.  The conus medullaris is not included. Cauda equina nerve roots appear within normal limits on precontrast images. Suggestion of abnormal enhancement of the left S1 or S2 nerve roots at the L5-S1 level (series 20, image 16 and series 21, image 40. No other abnormal cauda equina enhancement identified.  Capacious lumbar spinal canal.  No spinal stenosis.  The exiting  nerves of the lumbosacral plexus appear within normal limits. Mild vascular artifact related to the common iliac arteries and iliac bifurcations.  IMPRESSION: 1. 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 But there is a larger enhancing plaque at the T10-T11 level dorsally which appears to involve the substance of the cord. 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 Follow-up CSF analysis may confirm, but recommend Head CT prior to lumbar puncture to exclude intracranial mass effect. 2. Lumbosacral plexus was imaged and appears within normal limits. However, there is suggestion of abnormal enhancement of the left S1 or S2 nerve roots in the cauda equina at the L5-S1 level. 3. 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 Bone marrow treatment effect or hemosiderosis are less likely. 4. Chronic right side obstructive uropathy; moderate to severe right hydronephrosis and hydroureter due to an obstructing calculus at the level of the right sacrum.   Electronically Signed   By: Lars Pinks M.D.   On:  02/28/2014 21:59   US Renal  02/23/2014   CLINICAL DATA:  Acute renal failure.  EXAM: RENAL/URINARY TRACT ULTRASOUND COMPLETE  COMPARISON:  Renal ultrasound January 19, 2014.  FINDINGS: Right Kidney:  Length: 11.7 cm. Moderate right hydronephrosis. 1 cm echogenic upper pole and 6 mm echogenic middle pole calculi with acoustic shadowing.  Left Kidney:  Length: 13.2 cm. 7 mm echogenic upper pole and 5 mm echogenic interpolar renal calculi with acoustic shadowing. Trace left perinephric fluid.  Bladder:  Possible debris within the bladder.  IMPRESSION: Stable right hydronephrosis.  Bilateral nephrolithiasis.  Possible debris within the urinary bladder. Consider correlation with urinary analysis.   Electronically Signed   By: Elon Alas   On: 02/23/2014 21:01   Ir  Removal Tun Access W/ Port W/o Fl Mod Sed  02/27/2014   CLINICAL DATA:  HIV, bacteremia, right port catheter  EXAM: RIGHT PORT CATHETER REMOVAL  Date:  8/7/20158/01/2014 2:50 pm  Radiologist:  M. Daryll Brod, MD  MEDICATIONS AND MEDICAL HISTORY: 1 mg Versed, 50 mcg fentanyl  COMPLICATIONS: No immediate  PROCEDURE: Informed consent was obtained from the patient following explanation of the procedure, risks, benefits and alternatives. The patient understands, agrees and consents for the procedure. All questions were addressed. A time out was performed.  Maximal barrier sterile technique utilized including caps, mask, sterile gowns, sterile gloves, large sterile drape, hand hygiene, and ).  Under sterile conditions and local anesthesia, an incision was made along the existing port catheter scar. Blunt and sharp dissection utilized to remove the port catheter entirely. Hemostasis obtained. The subcutaneous pocket was flushed with saline. Because of the ongoing bacteremia, the port catheter pocket was left open and packed with a wet-to-dry dressing. No immediate complication. The patient tolerated the procedure well.  IMPRESSION: Successful right IJ port catheter removal.   Electronically Signed   By: Daryll Brod M.D.   On: 02/27/2014 14:56   Dg Chest Port 1 View  02/23/2014   CLINICAL DATA:  Possible sepsis  EXAM: PORTABLE CHEST - 1 VIEW  COMPARISON:  01/19/2014  FINDINGS: The lungs are now clear. Negative for pneumonia or effusion. Heart size and vascularity are normal. Port-A-Cath tip in the SVC.  IMPRESSION: No active disease.   Electronically Signed   By: Franchot Gallo M.D.   On: 02/23/2014 18:17  Results for orders placed during the hospital encounter of 02/23/14  CULTURE, BLOOD (ROUTINE X 2)      Result Value Ref Range   Specimen Description BLOOD RIGHT ARM     Special Requests BOTTLES DRAWN AEROBIC AND ANAEROBIC 10CC     Culture  Setup Time       Value: 02/23/2014 22:16     Performed at [facility]   Culture       Value: STAPHYLOCOCCUS AUREUS     Note: RIFAMPIN AND GENTAMICIN SHOULD NOT BE USED AS SINGLE DRUGS FOR TREATMENT OF STAPH INFECTIONS. This organism is presumed to be Clindamycin resistant based on detection of inducible Clindamycin resistance.     Note: Gram Stain Report Called to,Read Back By and Verified With: ANDY BRAKE@3 :00PM ON 02/24/14 BY DANTS     Performed at [facility]   Report Status 02/26/2014 FINAL     Organism ID, Bacteria STAPHYLOCOCCUS AUREUS    CULTURE, BLOOD (ROUTINE X 2)      Result Value Ref Range   Specimen Description BLOOD LEFT ARM     Special Requests BOTTLES DRAWN AEROBIC AND ANAEROBIC 10CC  Culture  Setup Time       Value: 02/23/2014 22:16     Performed at [facility]   Culture       Value: STAPHYLOCOCCUS AUREUS     Note: SUSCEPTIBILITIES PERFORMED ON PREVIOUS CULTURE WITHIN THE LAST 5 DAYS.     Note: Gram Stain Report Called to,Read Back By and Verified With: TY HICKS ON 02/24/2014 AT 10:44P BY WILEJ     Performed at [facility]   Report Status 02/26/2014 FINAL    URINE CULTURE      Result Value Ref Range   Specimen Description URINE, CLEAN CATCH     Special Requests NONE     Culture  Setup Time       Value: 02/24/2014 04:09     Performed at [facility]       Value: NO GROWTH     Performed at [facility]   Culture       Value: NO GROWTH     Performed at [facility]   Report Status 02/25/2014 FINAL    GC/CHLAMYDIA PROBE AMP      Result Value Ref Range   CT Probe RNA NEGATIVE  NEGATIVE   GC Probe RNA NEGATIVE  NEGATIVE  CULTURE, BLOOD (ROUTINE X 2)      Result Value Ref Range   Specimen Description BLOOD LEFT ARM     Special Requests BOTTLES DRAWN AEROBIC AND ANAEROBIC 10CC     Culture  Setup Time       Value: 02/25/2014 17:15     Performed at [facility]   Culture       Value:        BLOOD CULTURE RECEIVED NO GROWTH TO DATE CULTURE WILL BE HELD  FOR 5 DAYS BEFORE ISSUING A FINAL NEGATIVE REPORT     Performed at [facility]   Report Status PENDING    CULTURE, BLOOD (ROUTINE X 2)      Result Value Ref Range   Specimen Description BLOOD LEFT ARM     Special Requests BOTTLES DRAWN AEROBIC ONLY 10CC     Culture  Setup Time       Value: 02/25/2014 17:15     Performed at [facility]   Culture       Value: DIPHTHEROIDS(CORYNEBACTERIUM SPECIES)     Note: Standardized susceptibility testing for this organism is not available.     Note: Gram Stain Report Called to,Read Back By and Verified With:  BECKA QIU;0143;02/28/14;SMIAS PREVIOUSLY REPORTED AS GRAM NEGATIVE RODS CORRECTED RESULTS CALLED TO: LAURA JAMA 03/01/14 @ 11:32AM BY RUSCOE A.     Performed at [facility]   Report Status 03/01/2014 FINAL    COMPREHENSIVE METABOLIC PANEL      Result Value Ref Range   Sodium 132 (*) 137 - 147 mEq/L   Potassium 3.2 (*) 3.7 - 5.3 mEq/L   Chloride 101  96 - 112 mEq/L   CO2 15 (*) 19 - 32 mEq/L   Glucose, Bld 90  70 - 99 mg/dL   BUN 50 (*) 6 - 23 mg/dL   Creatinine, Ser 3.93 (*) 0.50 - 1.35 mg/dL   Calcium 8.4  8.4 - 10.5 mg/dL   Total Protein 7.9  6.0 - 8.3 g/dL   Albumin 3.2 (*) 3.5 - 5.2 g/dL   AST 18  0 - 37 U/L   ALT 25  0 - 53 U/L   Alkaline  Phosphatase 183 (*) 39 - 117 U/L   Total Bilirubin 0.3  0.3 - 1.2 mg/dL   GFR calc non Af Amer 19 (*) >90 mL/min   GFR calc Af Amer 22 (*) >90 mL/min   Anion gap 16 (*) 5 - 15  URINALYSIS, ROUTINE W REFLEX MICROSCOPIC      Result Value Ref Range   Color, Urine YELLOW  YELLOW   APPearance CLOUDY (*) CLEAR   Specific Gravity, Urine 1.012  1.005 - 1.030   pH 6.5  5.0 - 8.0   Glucose, UA NEGATIVE  NEGATIVE mg/dL   Hgb urine dipstick MODERATE (*) NEGATIVE   Bilirubin Urine NEGATIVE  NEGATIVE   Ketones, ur NEGATIVE  NEGATIVE mg/dL   Protein, ur 100 (*) NEGATIVE mg/dL   Urobilinogen, UA 0.2  0.0 - 1.0 mg/dL   Nitrite NEGATIVE  NEGATIVE   Leukocytes, UA MODERATE (*) NEGATIVE   CBC WITH DIFFERENTIAL      Result Value Ref Range   WBC 6.3  4.0 - 10.5 K/uL   RBC 2.91 (*) 4.22 - 5.81 MIL/uL   Hemoglobin 9.9 (*) 13.0 - 17.0 g/dL   HCT 28.5 (*) 39.0 - 52.0 %   MCV 97.9  78.0 - 100.0 fL   MCH 34.0  26.0 - 34.0 pg   MCHC 34.7  30.0 - 36.0 g/dL   RDW 16.2 (*) 11.5 - 15.5 %   Platelets 88 (*) 150 - 400 K/uL   Neutrophils Relative % 82 (*) 43 - 77 %   Lymphocytes Relative 9 (*) 12 - 46 %   Monocytes Relative 6  3 - 12 %   Eosinophils Relative 3  0 - 5 %   Basophils Relative 0  0 - 1 %   Neutro Abs 5.1  1.7 - 7.7 K/uL   Lymphs Abs 0.6 (*) 0.7 - 4.0 K/uL   Monocytes Absolute 0.4  0.1 - 1.0 K/uL   Eosinophils Absolute 0.2  0.0 - 0.7 K/uL   Basophils Absolute 0.0  0.0 - 0.1 K/uL   WBC Morphology MILD LEFT SHIFT (1-5% METAS, OCC MYELO, OCC BANDS)    RPR      Result Value Ref Range   RPR NON REAC  NON REAC  SODIUM, URINE, RANDOM      Result Value Ref Range   Sodium, Ur 42    UREA NITROGEN, URINE      Result Value Ref Range   Urea Nitrogen, Ur 487    CBC      Result Value Ref Range   WBC 4.9  4.0 - 10.5 K/uL   RBC 2.37 (*) 4.22 - 5.81 MIL/uL   Hemoglobin 7.8 (*) 13.0 - 17.0 g/dL   HCT 22.7 (*) 39.0 - 52.0 %   MCV 95.8  78.0 - 100.0 fL   MCH 32.9  26.0 - 34.0 pg   MCHC 34.4  30.0 - 36.0 g/dL   RDW 16.3 (*) 11.5 - 15.5 %   Platelets 94 (*) 150 - 400 K/uL  BASIC METABOLIC PANEL      Result Value Ref Range   Sodium 135 (*) 137 - 147 mEq/L   Potassium 3.3 (*) 3.7 - 5.3 mEq/L   Chloride 105  96 - 112 mEq/L   CO2 16 (*) 19 - 32 mEq/L   Glucose, Bld 90  70 - 99 mg/dL   BUN 43 (*) 6 - 23 mg/dL   Creatinine, Ser 3.24 (*) 0.50 - 1.35 mg/dL   Calcium 7.9 (*)  8.4 - 10.5 mg/dL   GFR calc non Af Amer 24 (*) >90 mL/min   GFR calc Af Amer 28 (*) >90 mL/min   Anion gap 14  5 - 15  URINE MICROSCOPIC-ADD ON      Result Value Ref Range   Squamous Epithelial / LPF FEW (*) RARE   WBC, UA 7-10  <3 WBC/hpf   RBC / HPF 3-6  <3 RBC/hpf   Bacteria, UA RARE  RARE  CK       Result Value Ref Range   Total CK 74  7 - 232 U/L  URIC ACID      Result Value Ref Range   Uric Acid, Serum 6.1  4.0 - 7.8 mg/dL  BASIC METABOLIC PANEL      Result Value Ref Range   Sodium 136 (*) 137 - 147 mEq/L   Potassium 3.3 (*) 3.7 - 5.3 mEq/L   Chloride 107  96 - 112 mEq/L   CO2 15 (*) 19 - 32 mEq/L   Glucose, Bld 96  70 - 99 mg/dL   BUN 30 (*) 6 - 23 mg/dL   Creatinine, Ser 2.61 (*) 0.50 - 1.35 mg/dL   Calcium 7.9 (*) 8.4 - 10.5 mg/dL   GFR calc non Af Amer 31 (*) >90 mL/min   GFR calc Af Amer 36 (*) >90 mL/min   Anion gap 14  5 - 15  BASIC METABOLIC PANEL      Result Value Ref Range   Sodium 140  137 - 147 mEq/L   Potassium 3.4 (*) 3.7 - 5.3 mEq/L   Chloride 113 (*) 96 - 112 mEq/L   CO2 15 (*) 19 - 32 mEq/L   Glucose, Bld 92  70 - 99 mg/dL   BUN 22  6 - 23 mg/dL   Creatinine, Ser 2.23 (*) 0.50 - 1.35 mg/dL   Calcium 8.0 (*) 8.4 - 10.5 mg/dL   GFR calc non Af Amer 38 (*) >90 mL/min   GFR calc Af Amer 44 (*) >90 mL/min   Anion gap 12  5 - 15  CBC      Result Value Ref Range   WBC 4.5  4.0 - 10.5 K/uL   RBC 2.32 (*) 4.22 - 5.81 MIL/uL   Hemoglobin 7.8 (*) 13.0 - 17.0 g/dL   HCT 23.2 (*) 39.0 - 52.0 %   MCV 100.0  78.0 - 100.0 fL   MCH 33.6  26.0 - 34.0 pg   MCHC 33.6  30.0 - 36.0 g/dL   RDW 16.5 (*) 11.5 - 15.5 %   Platelets 92 (*) 150 - 400 K/uL  BASIC METABOLIC PANEL      Result Value Ref Range   Sodium 140  137 - 147 mEq/L   Potassium 3.4 (*) 3.7 - 5.3 mEq/L   Chloride 104  96 - 112 mEq/L   CO2 25  19 - 32 mEq/L   Glucose, Bld 90  70 - 99 mg/dL   BUN 13  6 - 23 mg/dL   Creatinine, Ser 1.72 (*) 0.50 - 1.35 mg/dL   Calcium 8.3 (*) 8.4 - 10.5 mg/dL   GFR calc non Af Amer 52 (*) >90 mL/min   GFR calc Af Amer 60 (*) >90 mL/min   Anion gap 11  5 - 15  BASIC METABOLIC PANEL      Result Value Ref Range   Sodium 140  137 - 147 mEq/L   Potassium 4.3  3.7 - 5.3 mEq/L  Chloride 106  96 - 112 mEq/L   CO2 21  19 - 32 mEq/L   Glucose, Bld 88  70 - 99 mg/dL    BUN 12  6 - 23 mg/dL   Creatinine, Ser 1.64 (*) 0.50 - 1.35 mg/dL   Calcium 8.3 (*) 8.4 - 10.5 mg/dL   GFR calc non Af Amer 55 (*) >90 mL/min   GFR calc Af Amer 63 (*) >90 mL/min   Anion gap 13  5 - 15  CBC      Result Value Ref Range   WBC 8.0  4.0 - 10.5 K/uL   RBC 2.35 (*) 4.22 - 5.81 MIL/uL   Hemoglobin 8.0 (*) 13.0 - 17.0 g/dL   HCT 23.4 (*) 39.0 - 52.0 %   MCV 99.6  78.0 - 100.0 fL   MCH 34.0  26.0 - 34.0 pg   MCHC 34.2  30.0 - 36.0 g/dL   RDW 15.9 (*) 11.5 - 15.5 %   Platelets 158  150 - 400 K/uL  I-STAT CG4 LACTIC ACID, ED      Result Value Ref Range   Lactic Acid, Venous 1.05  0.5 - 2.2 mmol/L  POC OCCULT BLOOD, ED      Result Value Ref Range   Fecal Occult Bld NEGATIVE  NEGATIVE   A/P: Pt with HIV, lymphoma, acute on CKD, recent staph bacteremia, nephrolithiasis and right hydronephrosis. Plan is for right percutaneous nephrostomy tube placement today. Details/risks of procedure d/w pt with his understanding and consent.

## 2014-03-01 NOTE — Progress Notes (Signed)
Initial blood pressure low.MD on call made aware.Orders done and carried out.

## 2014-03-01 NOTE — Consult Note (Signed)
Urology Consult   Physician requesting consult: Dr. Algis Liming  Reason for consult: Right hydronephrosis and right ureteral stone  History of Present Illness: Thomas [name] is a 33 y.o. with HIV and lympoma who was admitted to the hospital on 02/23/14 with complaints of a cutaneous infection of the left groin. He is followed by Dr. Alen Blew for his lymphoma and has been receiving systemic chemotherapy. He has recent MSSA bacteremia which was found to be related to a likely port-a-cath infection and it has since been removed.  He also has one blood culture that was positive for gram negative rods although other blood cultures this admission have not grown out gram negative rods.  He also has had an AKI with his Cr having increased to 3.24 upon admission although it has decreased back to 1.6 which appears to be consistent with his baseline. Evaluation of his AKI led to a renal ultrasound demonstrated right hydronephrosis which was noted to be chronic on prior imaging and an MRI of the spine (performed due to bilateral lower back pain and lower extremity radicular symptoms) confirmed right hydroureteronephrosis down to a filling defect in the right ureter. A stone CT scan confirmed large burden 1 cm conglomeration of stones in the ureter as the etiology for obstruction. On review, he had a CT scan in January 2015 which demonstrated these same large obstructing stones although no urologic consultation or intervention was requested at that time.   Currently, the patient is asymptomatic and denies any right flank pain.  He has been afebrile and does not have a leukocytosis.  However, he does have documented hypotension noted today with systolic blood pressures in the 70s.  He has not been symptomatic from his hypotension apparently although has not been ambulating.  He does have a history of urolithiasis since beginning treatment for HIV and has always spontaneously passed his stones with his last known episode  having been at least a couple of years ago.   Past Medical History  Diagnosis Date  . HIV disease 06/25/2012  . Skin lesion 07/03/2012  . Leg pain 07/03/2012  . Sinus tachycardia 08/06/2012  . Penile cyst 08/06/2012  . Cancer     Past Surgical History  Procedure Laterality Date  . 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  this after several previous I and Ds of rectal abcess.   [name] node biopsy Right 08/08/2013    Procedure: SUPRACLAVICULAR LYMPH NODE BIOPSY;  Surgeon: Gaye Pollack, MD;  Location: [facility];  Service: Thoracic;  Laterality: Right;  . 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     ciprofloxacin 500 MG tablet  Commonly known as:  CIPRO  Take 500 mg by mouth 2 (two) times daily. Patient cannot remember when he started medication     efavirenz-emtricitabine-tenofovir 600-200-300 MG per tablet  Commonly known as:  ATRIPLA  Take 1 tablet by mouth at bedtime.     ferrous sulfate 325 (65 FE) MG tablet  Take 1 tablet (325 mg total) by mouth 2 (two) times daily with a meal.  lidocaine-prilocaine cream  Commonly known as:  EMLA  APPLY TOPICALLY TO PORT AS DIRECTED.     metroNIDAZOLE 500 MG tablet  Commonly known as:  FLAGYL  Take 500 mg by mouth 3 (three) times daily. Patient cannot remember when he started medication     potassium chloride SA 20 MEQ tablet  Commonly known as:  K-DUR,KLOR-CON  Take 1 tablet (20 mEq total) by mouth daily.     prochlorperazine 10 MG tablet  Commonly known as:  COMPAZINE  Take 1 tablet (10 mg total) by mouth every 6 (six) hours as needed for nausea or vomiting.         Scheduled Meds: . calcium carbonate  800 mg of elemental calcium Oral TID  .  ceFAZolin (ANCEF) IV  2 g Intravenous 3 times per day  . clotrimazole-betamethasone   Topical BID  . feeding supplement (ENSURE COMPLETE)  237 mL Oral TID BM  . ferrous sulfate  325 mg Oral BID WC   Continuous Infusions:  PRN Meds:.  Allergies:  Allergies  Allergen Reactions  . Tylenol [Acetaminophen]     sweats    History reviewed. No pertinent family history.  Social History:  reports that he has quit smoking. His smoking use included Cigarettes. He started smoking about 6 months ago. He has a 15 pack-year smoking history. He has never used smokeless tobacco. He reports that he does not drink alcohol or use illicit drugs.  ROS: A complete review of systems was performed.  All systems are negative except for pertinent findings as noted.  Physical Exam:  Vital signs in last 24 hours: Temp:  [97.5 F (36.4 C)-98.7 F (37.1 C)] 97.5 F (36.4 C) (08/09 0914) Pulse Rate:  [84-100] 92 (08/09 0914) Resp:  [16-17] 16 (08/09 0914) BP: (76-92)/(42-58) 90/57 mmHg (08/09 0914) SpO2:  [99 %-100 %] 100 % (08/09 0914) Weight:  [60.192 kg (132 lb 11.2 oz)] 60.192 kg (132 lb 11.2 oz) (08/08 2052) Constitutional:  Alert and oriented, No acute distress Cardiovascular: Regular rate and rhythm, No JVD Respiratory: Normal respiratory effort, Lungs clear bilaterally GI: Abdomen is soft, nontender, nondistended, no abdominal masses GU: No CVA tenderness Neurologic: Grossly intact Psychiatric: Normal mood and affect  Laboratory Data:   Recent Labs  02/27/14 0700  WBC 8.0  HGB 8.0*  HCT 23.4*  PLT 158     Recent Labs  02/27/14 0700  NA 140  K 4.3  CL 106  GLUCOSE 88  BUN 12  CALCIUM 8.3*  CREATININE 1.64*     No results found for this or any previous visit (from the past 24 hour(s)). Recent Results (from the past 240 hour(s))  CULTURE, BLOOD (ROUTINE X 2)     Status: None   Collection  Time    02/23/14  6:08 PM      Result Value Ref Range Status   Specimen Description BLOOD RIGHT ARM   Final   Special Requests BOTTLES DRAWN AEROBIC AND ANAEROBIC 10CC   Final   Culture  Setup Time     Final   Value: 02/23/2014 22:16     Performed at [facility]   Culture     Final   Value: STAPHYLOCOCCUS AUREUS     Note: RIFAMPIN AND GENTAMICIN SHOULD NOT BE USED AS SINGLE DRUGS FOR TREATMENT OF STAPH INFECTIONS. This organism is presumed to be Clindamycin resistant based on detection of inducible Clindamycin resistance.     Note: Gram Stain Report Called to,Read Back By and  Verified With: ANDY BRAKE@3 :00PM ON 02/24/14 BY DANTS     Performed at [facility]   Report Status 02/26/2014 FINAL   Final   Organism ID, Bacteria STAPHYLOCOCCUS AUREUS   Final  CULTURE, BLOOD (ROUTINE X 2)     Status: None   Collection Time    02/23/14  6:30 PM      Result Value Ref Range Status   Specimen Description BLOOD LEFT ARM   Final   Special Requests BOTTLES DRAWN AEROBIC AND ANAEROBIC 10CC   Final   Culture  Setup Time     Final   Value: 02/23/2014 22:16     Performed at [facility]   Culture     Final   Value: STAPHYLOCOCCUS AUREUS     Note: SUSCEPTIBILITIES PERFORMED ON PREVIOUS CULTURE WITHIN THE LAST 5 DAYS.     Note: Gram Stain Report Called to,Read Back By and Verified With: TY HICKS ON 02/24/2014 AT 10:44P BY WILEJ     Performed at [facility]   Report Status 02/26/2014 FINAL   Final  URINE CULTURE     Status: None   Collection Time    02/23/14  9:46 PM      Result Value Ref Range Status   Specimen Description URINE, CLEAN CATCH   Final   Special Requests NONE   Final   Culture  Setup Time     Final   Value: 02/24/2014 04:09     Performed at [facility]     Final   Value: NO GROWTH     Performed at [facility]   Culture     Final   Value: NO GROWTH     Performed at [facility]   Report Status 02/25/2014 FINAL    Final  GC/CHLAMYDIA PROBE AMP     Status: None   Collection Time    02/23/14  9:46 PM      Result Value Ref Range Status   CT Probe RNA NEGATIVE  NEGATIVE Final   GC Probe RNA NEGATIVE  NEGATIVE Final   Comment: (NOTE)                                                                                               **Normal Reference Range: Negative**          Assay performed using the Gen-Probe APTIMA COMBO2 (R) Assay.     Acceptable specimen types for this assay include APTIMA Swabs (Unisex,     endocervical, urethral, or vaginal), first void urine, and ThinPrep     liquid based cytology samples.     Performed at [place], BLOOD (ROUTINE X 2)     Status: None   Collection Time    02/25/14 12:20 PM      Result Value Ref Range Status   Specimen Description BLOOD LEFT ARM   Final   Special Requests BOTTLES DRAWN AEROBIC AND ANAEROBIC 10CC   Final   Culture  Setup Time     Final   Value: 02/25/2014 17:15  Performed at [facility]     Final   Value:        BLOOD CULTURE RECEIVED NO GROWTH TO DATE CULTURE WILL BE HELD FOR 5 DAYS BEFORE ISSUING A FINAL NEGATIVE REPORT     Performed at [facility]   Report Status PENDING   Incomplete  CULTURE, BLOOD (ROUTINE X 2)     Status: None   Collection Time    02/25/14 12:40 PM      Result Value Ref Range Status   Specimen Description BLOOD LEFT ARM   Final   Special Requests BOTTLES DRAWN AEROBIC ONLY 10CC   Final   Culture  Setup Time     Final   Value: 02/25/2014 17:15     Performed at [facility]   Culture     Final   Value: GRAM NEGATIVE RODS        BLOOD CULTURE RECEIVED NO GROWTH TO DATE CULTURE WILL BE HELD FOR 5 DAYS BEFORE ISSUING A FINAL NEGATIVE REPORT     Note: Gram Stain Report Called to,Read Back By and Verified With:  BECKA QIU;0143;02/28/14;SMIAS     Performed at [facility]   Report Status PENDING   Incomplete    Renal Function:  Recent Labs   02/23/14 1722 02/24/14 0340 02/24/14 1715 02/25/14 0540 02/26/14 0530 02/27/14 0700  CREATININE 3.93* 3.24* 2.61* 2.23* 1.72* 1.64*   Estimated Creatinine Clearance: 56.1 ml/min (by C-G formula based on Cr of 1.64).  Radiologic Imaging: Ct Abdomen Pelvis Wo Contrast  03/01/2014   CLINICAL DATA:  Right hydronephrosis and bilateral nephrolithiasis by ultrasound. Renal failure. History of lymphoma.  EXAM: CT ABDOMEN AND PELVIS WITHOUT CONTRAST  TECHNIQUE: Multidetector CT imaging of the abdomen and pelvis was performed following the standard protocol without IV contrast.  COMPARISON:  Renal ultrasound on 02/23/2014 and PET scan on 12/10/2013.  FINDINGS: There is evidence significant right-sided hydronephrosis and hydroureter as visualized on the recent renal ultrasound study. [place] ureter is followed into the pelvis, there is a column of 3 adjacent significant sized calculi located roughly at the level of the mid sacrum. All 3 of the calculi measure approximately 10 mm in greatest diameter.  There are also a number of intrarenal calculi bilaterally. On the right side a 7 mm calculus is located in the lower pole collecting system. In the left kidney, the largest calculus measures 8 mm in the upper pole. There are at least 8 other smaller calculi dispersed throughout the left collecting system measuring on the order of roughly 2-3 mm each in size. No left-sided hydronephrosis or ureteral calculi on the left.  Unenhanced appearance of the liver, spleen, pancreas, adrenal glands and bowel are unremarkable. Layering higher density in the gallbladder lumen likely relates to calculi. No evidence of gallbladder distention, inflammation or biliary dilatation.  The bladder is unremarkable. No free fluid or abscess is identified. There are some residual mildly prominent retroperitoneal lymph nodes identified which are similar in size compared to the prior PET scan and most prominent in the left periaortic region. Largest  measures approximately 11 mm in short axis. No bony abnormalities are seen.  IMPRESSION: 1. Significant right-sided hydronephrosis and hydroureter secondary to a column of three roughly 10 mm diameter calculi stacked on top of one another in the distal ureter at the level of the mid sacrum. 2. Bilateral intrarenal calculi with single calculus in the lower pole of the right kidney and multiple calculi in  the left kidney. The left kidney demonstrates no evidence of hydronephrosis. 3. Some residual mildly prominent left para-aortic retroperitoneal lymph nodes similar in size to the PET scan in May.   Electronically Signed   By: Aletta Edouard M.D.   On: 03/01/2014 08:55   Mr Thoracic Spine W Wo Contrast  02/28/2014   CLINICAL DATA:  33 year old male with history of HIV, lymphoma, bacteremia. Left lower extremity numbness.  EXAM: MRI THORACIC SPINE WITHOUT AND WITH CONTRAST  MRI LUMBOSACRAL PLEXUS WITHOUT AND WITH CONTRAST.  TECHNIQUE: Multiplanar and multiecho pulse sequences of the thoracic spine and lumbosacral plexus were obtained without and with intravenous contrast.  CONTRAST:  63mL MULTIHANCE GADOBENATE DIMEGLUMINE 529 MG/ML IV SOLN  COMPARISON:  PET-CT 11/2013 and earlier. CT Abdomen and Pelvis 08/04/2013  FINDINGS: MR THORACIC SPINE FINDINGS  Limited sagittal imaging of the cervical spine appears negative except for diffusely abnormal bone marrow signal, including at the visualized skullbase.  Preserved thoracic vertebral height and alignment, but diffusely abnormal bone marrow signal, heterogeneously decreased on T1 and T2 imaging. Marrow signal is heterogeneously increased on STIR, and following contrast there is widespread heterogeneous bone marrow enhancement.  Axial post-contrast images are degraded by motion. Sagittal post-contrast images indicate multifocal abnormal enhancement along the dorsal spinal cord or dorsal surface of the spinal cord. The levels T4 through T7 are most affected in the upper  spine. At the T10-T11 level there is a larger area of abnormal enhancement which appears to extend farther into the substance of the cord (series 23 image 6), in this corresponds to abnormal T2 signal within the dorsal cord (series 7, image 32).  There is also subtle abnormal dorsal column increased T2 signal in the upper cord corresponding to the areas of enhancement, T4 -T6.  No thoracic spinal cord expansion. Capacious thoracic thecal sac. No spinal stenosis. Conus medullaris is not included on these images.  No epidural process in the thoracic spine.  Trace layering right pleural effusion. Mildly increased signal in the deep tendon right lower lobe. Visualized upper abdominal viscera remarkable for decreased T2 signal in the liver and spleen.  MR LUMBOSACRAL PLEXUS FINDINGS  Study is intermittently degraded by motion artifact despite repeated imaging attempts.  The entire lumbar spine is not included. Diffusely abnormal bone marrow signal. These images suggest transitional lumbosacral anatomy, uncertain when compared to the comparison exams.  Continued right hydronephrosis and right hydroureter related to an obstructing calculus at the level of the right sacrum, better demonstrated on 11/2013.  The conus medullaris is not included. Cauda equina nerve roots appear within normal limits on precontrast images. Suggestion of abnormal enhancement of the left S1 or S2 nerve roots at the L5-S1 level (series 20, image 16 and series 21, image 40. No other abnormal cauda equina enhancement identified.  Capacious lumbar spinal canal.  No spinal stenosis.  The exiting nerves of the lumbosacral plexus appear within normal limits. Mild vascular artifact related to the common iliac arteries and iliac bifurcations.  IMPRESSION: 1. 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 But there is a larger enhancing plaque at the T10-T11 level dorsally which appears to involve the substance of the  cord. 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 Follow-up CSF analysis may confirm, but recommend Head CT prior to lumbar puncture to exclude intracranial mass effect. 2. Lumbosacral plexus was imaged and appears within normal limits. However, there is suggestion of abnormal enhancement of the  left S1 or S2 nerve roots in the cauda equina at the L5-S1 level. 3. 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 Bone marrow treatment effect or hemosiderosis are less likely. 4. Chronic right side obstructive uropathy; moderate to severe right hydronephrosis and hydroureter due to an obstructing calculus at the level of the right sacrum.   Electronically Signed   By: Lars Pinks M.D.   On: 02/28/2014 21:59   Mr Lumbar Spine W Wo Contrast  02/28/2014   CLINICAL DATA:  33 year old male with history of HIV, lymphoma, bacteremia. Left lower extremity numbness.  EXAM: MRI THORACIC SPINE WITHOUT AND WITH CONTRAST  MRI LUMBOSACRAL PLEXUS WITHOUT AND WITH CONTRAST.  TECHNIQUE: Multiplanar and multiecho pulse sequences of the thoracic spine and lumbosacral plexus were obtained without and with intravenous contrast.  CONTRAST:  11mL MULTIHANCE GADOBENATE DIMEGLUMINE 529 MG/ML IV SOLN  COMPARISON:  PET-CT 11/2013 and earlier. CT Abdomen and Pelvis 08/04/2013  FINDINGS: MR THORACIC SPINE FINDINGS  Limited sagittal imaging of the cervical spine appears negative except for diffusely abnormal bone marrow signal, including at the visualized skullbase.  Preserved thoracic vertebral height and alignment, but diffusely abnormal bone marrow signal, heterogeneously decreased on T1 and T2 imaging. Marrow signal is heterogeneously increased on STIR, and following contrast there is widespread heterogeneous bone marrow enhancement.  Axial post-contrast images are degraded by motion. Sagittal post-contrast images indicate multifocal  abnormal enhancement along the dorsal spinal cord or dorsal surface of the spinal cord. The levels T4 through T7 are most affected in the upper spine. At the T10-T11 level there is a larger area of abnormal enhancement which appears to extend farther into the substance of the cord (series 23 image 6), in this corresponds to abnormal T2 signal within the dorsal cord (series 7, image 32).  There is also subtle abnormal dorsal column increased T2 signal in the upper cord corresponding to the areas of enhancement, T4 -T6.  No thoracic spinal cord expansion. Capacious thoracic thecal sac. No spinal stenosis. Conus medullaris is not included on these images.  No epidural process in the thoracic spine.  Trace layering right pleural effusion. Mildly increased signal in the deep tendon right lower lobe. Visualized upper abdominal viscera remarkable for decreased T2 signal in the liver and spleen.  MR LUMBOSACRAL PLEXUS FINDINGS  Study is intermittently degraded by motion artifact despite repeated imaging attempts.  The entire lumbar spine is not included. Diffusely abnormal bone marrow signal. These images suggest transitional lumbosacral anatomy, uncertain when compared to the comparison exams.  Continued right hydronephrosis and right hydroureter related to an obstructing calculus at the level of the right sacrum, better demonstrated on 11/2013.  The conus medullaris is not included. Cauda equina nerve roots appear within normal limits on precontrast images. Suggestion of abnormal enhancement of the left S1 or S2 nerve roots at the L5-S1 level (series 20, image 16 and series 21, image 40. No other abnormal cauda equina enhancement identified.  Capacious lumbar spinal canal.  No spinal stenosis.  The exiting nerves of the lumbosacral plexus appear within normal limits. Mild vascular artifact related to the common iliac arteries and iliac bifurcations.  IMPRESSION: 1. 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 But there is a larger enhancing plaque at the T10-T11 level dorsally which appears to involve the substance of the cord. 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 Follow-up CSF analysis may  confirm, but recommend Head CT prior to lumbar puncture to exclude intracranial mass effect. 2. Lumbosacral plexus was imaged and appears within normal limits. However, there is suggestion of abnormal enhancement of the left S1 or S2 nerve roots in the cauda equina at the L5-S1 level. 3. 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 Bone marrow treatment effect or hemosiderosis are less likely. 4. Chronic right side obstructive uropathy; moderate to severe right hydronephrosis and hydroureter due to an obstructing calculus at the level of the right sacrum.   Electronically Signed   By: Lars Pinks M.D.   On: 02/28/2014 21:59   Ir Removal Tun Access W/ Port W/o Fl Mod Sed  02/27/2014   CLINICAL DATA:  HIV, bacteremia, right port catheter  EXAM: RIGHT PORT CATHETER REMOVAL  Date:  8/7/20158/01/2014 2:50 pm  Radiologist:  M. Daryll Brod, MD  MEDICATIONS AND MEDICAL HISTORY: 1 mg Versed, 50 mcg fentanyl  COMPLICATIONS: No immediate  PROCEDURE: Informed consent was obtained from the patient following explanation of the procedure, risks, benefits and alternatives. The patient understands, agrees and consents for the procedure. All questions were addressed. A time out was performed.  Maximal barrier sterile technique utilized including caps, mask, sterile gowns, sterile gloves, large sterile drape, hand hygiene, and ).  Under sterile conditions and local anesthesia, an incision was made along the existing port catheter scar. Blunt and sharp dissection utilized to remove the port catheter entirely. Hemostasis obtained. The subcutaneous pocket was flushed with saline. Because of the ongoing  bacteremia, the port catheter pocket was left open and packed with a wet-to-dry dressing. No immediate complication. The patient tolerated the procedure well.  IMPRESSION: Successful right IJ port catheter removal.   Electronically Signed   By: Daryll Brod M.D.   On: 02/27/2014 14:56    I independently reviewed the above imaging studies.  Impression/Recommendation 1) AKI with CKD: It is unclear how much his right ureteral obstruction is contributing to his renal dysfunction.  Since his Cr has improved without intervention, it is unlikely that his obstruction is the cause for his AKI.  However, his obstruction has been present since at least January and he may have significant irreversible dysfunction of the right kidney which may be contributing to his CKD.  2) Right ureteral obstruction secondary to right ureteral stones: Considering his gram negative rod blood culture, his immunosuppression, and his hypotension, I have recommended he undergo decompression of his right ureteral obstruction. This may not improve function of the right kidney but will sort out whether this may be a source of infection and will allow further evaluation of his differential renal function to determine appropriate long term treatment for his obstruction. We discussed options including retrograde ureteral stent placement and percutaneous nephrostomy tube placement for drainage.  After reviewing the pros and cons of each approach with the patient and considering his hypotension documented this morning, the patient give informed consent to proceed with the most conservative approach and elects to have right nephrostomy drainage. I have spoken with Dr. Anselm Pancoast who is on call for IR today and he kindly agrees to proceed with nephrostomy drainage later today.  Will obtain cultures from the right renal collecting system at that time which will help to sort out whether he may have an infection (which despite his recent negative urine  culture is certainly possible if he has complete obstruction with infected urine above the level of obstruction).  If urine appears infected  during his nephrostomy placement, he will likely need to continue ongoing gram negative coverage. Otherwise, he will receive a prophylactic dose of ceftriaxone only.  Once his kidney has been drained, he will likely need a nuclear medicine renal scan to determine relative renal function of the right kidney to see if definitive therapy will include treatment of his stone with renal preservation vs nephrectomy if he has minimal function.  Medard Decuir,LES 03/01/2014, 11:22 AM    Pryor Curia MD   CC: Dr. Garald Braver

## 2014-03-01 NOTE — Progress Notes (Signed)
PT Cancellation Note  Patient Details Name: Thomas [name] MRN: [id number] DOB: May 11, 1981   Cancelled Treatment:     Pt under orders for bed rest following lumbar puncture and nephrostomy. Will return for evaluation tomorrow.   Junita Push 03/01/2014, 4:04 PM  Adriana Reams, SPT Acute [facility] Office Number: [phone]

## 2014-03-01 NOTE — Progress Notes (Signed)
PROGRESS NOTE    Thomas [name] [id number]:[id number] DOB: [birth year] DOA: 02/23/2014 PCP: Philis Fendt, MD  HPI/Brief narrative 33 y/o male patient with h/o HIV (follows with Dr. Linus Salmons), Hodgkin's Lymphoma on chemo - last 2 weeks ago(follows with Dr. Alen Blew), admitted on 8/3 with worsening pruritis of left side groin and thigh rash that he had for 5-7 days PTA and was not getting better with home remedies including streoid cream. Last CD4 in April was 360, viral load undetectable at that time. 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 He has MSSA bacteremia- etiology- ? Shingles rash vs port. On Ancef. 2 D Echo neg. TEE showed possible vegetation on Porta a cath tip- removed 8/7. Supposed to get a PICC 8/8 but 1/2 blood culture shows gram neg rods- ? Contaminant- discussed with ID, hold of PICC, repeat BCx's.   Assessment/Plan:  1. Acute on Chronic Renal Failure: unclear etiology. States no GI symptoms and appetite good. Denies NSAID's. ? ATN from hypotension. Renal US shows stable right hydronephrosis. Uric acid and CK normal. Chemo meds- less likely etiology. Baseline creat: fluctuating: ? 1.6-1.9.  Resolved after IV fluids. Creatinine back to baseline. 2. Moderate to severe right hydronephrosis/ hydroureter secondary to obstructing ureteral calculi: Renal ultrasound earlier had shown stable right hydronephrosis. MRI thoracic and lumbar spine done to evaluate LLE numbness revealed moderate to severe right hydronephrosis. Urology consulted and CT abdomen and pelvis without contrast was obtained which confirmed right hydronephrosis/hydroureter with obstructing ureteral calculi. Nephrology requested percutaneous nephrostomy by IR. The hydronephrosis seems to be a chronic finding since January 2015 and not sure how much creatinine will improve. We'll give a dose of IV Rocephin to cover for gram-negative bacteria until final results of  percutaneous nephrostomy urine culture (urine was cloudy) results available. Initial urine culture 8/3 was negative. 3. Non AG metabolic acidosis: Unclear etiology. Has had this on reviewing past labs earlier this year.? Secondary to HIV medications. Resolved after IV bicarbonate infusion- DC'ed.  4. MSSA bacteremia secondary to infected Port-A-Cath: 2 of 2 blood cultures positive. Source-? From infected shingles versus Port a cath. Even though he has bacteremia, do not believe that his initial presentation was consistent with sepsis- he has chronic asymptomatic hypotension. 2 D Echo negative for vegetations. Initially was empirically on Van and Ancef > now narrowed to Ancef. ID follow up appreciated. Surveillance blood culture 8/5: 1/2 positive for Gram neg rods- ? contaminant repeat..  TEE 8/7 shows thickened port a cath tip in the SVC concerning for thrombus and or vegetation-DC'ed Port-A-Cath 8/7. Continue Ancef via peripheral IV. Hold PICC until work up as above. 5. Hypotension: apparently chronic and asymptomatic. 6. Shingles/Skin rash: over left side of groin, thigh and buttock. Evaluated by ID and consistent with shingles involving S1 dermatome-all lesions mostly dried up-no isolation or treatment recommended at this time. 7. HIV: meds held by ID. 8. Hodgkin's disease: chemo was due on 02/27/14. Dr. Alen Blew input appreciated-outpatient followup regarding chemotherapy. MRI spine suggests leptomeningeal involvement-LP done 8/9-followup CSF results. 9. Hypokalemia: replaced 10. Chronic Anemia: Likely 2/2 to chronic disease, HIV, lymphoma and Rx. No reported bleeding. Transfuse if Hb < 7. Stable. 11. Thrombocytopenia: chronic and stable. 12. Severe malnutrition in the context of chronic illness & Underweight: Nutrition consult appreciated- Mx per them. 69. 1/2 blood cultures 8/5, Diptheroids: Likely contaminant. Repeated blood cultures 8/8. Postpone PICC placement. 14. 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 Neurology suspects HSV involvement. LP done. Followup CSF results.    Code Status: Full Family Communication:  discussed with patient's mother (patient consented) on 8/8.  Disposition Plan: DC home when medically stable.   Consultants:  Infectious disease  Cardiology- for TEE  Interventional radiology-to remove Port-A-Cath  Neurology  Urology  Interventional radiology  Procedures:  TEE 8/7 IMPRESSION:  1. Thickened port-a-cath tip in the SVC concerning for thrombus and/or vegetation 2. No evidence for valvular endocarditis. 3. No LAA thrombus 4. LVEF 55-60% with normal wall motion RECOMMENDATIONS:  1. Catheter tip may be the source of bacteremia. Consider port-a-cath removal.   Right percutaneous nephrostomy 8/9  Lumbar puncture 8/9  Antibiotics:  Vancomycin- DC'ed  Ancef  IV Rocephin x1: 8/9  Subjective: Denies complaints. Denies dizziness, lightheadedness. Gives history of a fall a few weeks back but denies frequent falls.  Objective: Filed Vitals:   03/01/14 1413 03/01/14 1416 03/01/14 1420 03/01/14 1422  BP: 94/61 92/59 83/61  92/59  Pulse: 81 79 77 76  Temp:      TempSrc:      Resp: 22 15 15 16   Height:      Weight:      SpO2: 100% 100% 99% 100%    Intake/Output Summary (Last 24 hours) at 03/01/14 1501 Last data filed at 03/01/14 0930  Gross per 24 hour  Intake    120 ml  Output   1300 ml  Net  -1180 ml   Filed Weights   02/26/14 2122 02/27/14 2100 02/28/14 2052  Weight: 63.699 kg (140 lb 6.9 oz) 62.143 kg (137 lb) 60.192 kg (132 lb 11.2 oz)     Exam:  General exam: Pleasant young male lying comfortably in bed. Does not look toxic or septic. Respiratory system: Clear. No increased work of breathing. Cardiovascular system: S1 & S2 heard, RRR. No JVD, murmurs, gallops, clicks or pedal edema. Gastrointestinal system: Abdomen is nondistended, soft  and nontender. Normal bowel sounds heard. Central nervous system: Alert and oriented. No focal neurological deficits. Extremities: Symmetric 5 x 5 power. Patchy diminished sensation left lower extremity circumferentially up to groin. Skin: Excoriated drying skin rash over left groin, extending to left posterior thing, left buttock and hip.   Data Reviewed: Basic Metabolic Panel:  Recent Labs Lab 02/24/14 0340 02/24/14 1715 02/25/14 0540 02/26/14 0530 02/27/14 0700  NA 135* 136* 140 140 140  K 3.3* 3.3* 3.4* 3.4* 4.3  CL 105 107 113* 104 106  CO2 16* 15* 15* 25 21  GLUCOSE 90 96 92 90 88  BUN 43* 30* 22 13 12   CREATININE 3.24* 2.61* 2.23* 1.72* 1.64*  CALCIUM 7.9* 7.9* 8.0* 8.3* 8.3*   Liver Function Tests:  Recent Labs Lab 02/23/14 1722  AST 18  ALT 25  ALKPHOS 183*  BILITOT 0.3  PROT 7.9  ALBUMIN 3.2*   No results found for this basename: LIPASE, AMYLASE,  in the last 168 hours No results found for this basename: AMMONIA,  in the last 168 hours CBC:  Recent Labs Lab 02/23/14 1722 02/24/14 0340 02/25/14 0540 02/27/14 0700  WBC 6.3 4.9 4.5 8.0  NEUTROABS 5.1  --   --   --   HGB 9.9* 7.8* 7.8* 8.0*  HCT 28.5* 22.7* 23.2* 23.4*  MCV 97.9 95.8 100.0 99.6  PLT 88* 94* 92* 158   Cardiac Enzymes:  Recent Labs Lab 02/24/14 1715  CKTOTAL 74   BNP (last 3 results) No  results found for this basename: PROBNP,  in the last 8760 hours CBG: No results found for this basename: GLUCAP,  in the last 168 hours  Recent Results (from the past 240 hour(s))  CULTURE, BLOOD (ROUTINE X 2)     Status: None   Collection Time    02/23/14  6:08 PM      Result Value Ref Range Status   Specimen Description BLOOD RIGHT ARM   Final   Special Requests BOTTLES DRAWN AEROBIC AND ANAEROBIC 10CC   Final   Culture  Setup Time     Final   Value: 02/23/2014 22:16     Performed at [facility]   Culture     Final   Value: STAPHYLOCOCCUS AUREUS     Note: RIFAMPIN AND  GENTAMICIN SHOULD NOT BE USED AS SINGLE DRUGS FOR TREATMENT OF STAPH INFECTIONS. This organism is presumed to be Clindamycin resistant based on detection of inducible Clindamycin resistance.     Note: Gram Stain Report Called to,Read Back By and Verified With: ANDY BRAKE@3 :00PM ON 02/24/14 BY DANTS     Performed at [facility]   Report Status 02/26/2014 FINAL   Final   Organism ID, Bacteria STAPHYLOCOCCUS AUREUS   Final  CULTURE, BLOOD (ROUTINE X 2)     Status: None   Collection Time    02/23/14  6:30 PM      Result Value Ref Range Status   Specimen Description BLOOD LEFT ARM   Final   Special Requests BOTTLES DRAWN AEROBIC AND ANAEROBIC 10CC   Final   Culture  Setup Time     Final   Value: 02/23/2014 22:16     Performed at [facility]   Culture     Final   Value: STAPHYLOCOCCUS AUREUS     Note: SUSCEPTIBILITIES PERFORMED ON PREVIOUS CULTURE WITHIN THE LAST 5 DAYS.     Note: Gram Stain Report Called to,Read Back By and Verified With: TY HICKS ON 02/24/2014 AT 10:44P BY WILEJ     Performed at [facility]   Report Status 02/26/2014 FINAL   Final  URINE CULTURE     Status: None   Collection Time    02/23/14  9:46 PM      Result Value Ref Range Status   Specimen Description URINE, CLEAN CATCH   Final   Special Requests NONE   Final   Culture  Setup Time     Final   Value: 02/24/2014 04:09     Performed at [facility]     Final   Value: NO GROWTH     Performed at [facility]   Culture     Final   Value: NO GROWTH     Performed at [facility]   Report Status 02/25/2014 FINAL   Final  GC/CHLAMYDIA PROBE AMP     Status: None   Collection Time    02/23/14  9:46 PM      Result Value Ref Range Status   CT Probe RNA NEGATIVE  NEGATIVE Final   GC Probe RNA NEGATIVE  NEGATIVE Final   Comment: (NOTE)                                                                                               **  Normal Reference Range:  Negative**          Assay performed using the Gen-Probe APTIMA COMBO2 (R) Assay.     Acceptable specimen types for this assay include APTIMA Swabs (Unisex,     endocervical, urethral, or vaginal), first void urine, and ThinPrep     liquid based cytology samples.     Performed at [facility], BLOOD (ROUTINE X 2)     Status: None   Collection Time    02/25/14 12:20 PM      Result Value Ref Range Status   Specimen Description BLOOD LEFT ARM   Final   Special Requests BOTTLES DRAWN AEROBIC AND ANAEROBIC 10CC   Final   Culture  Setup Time     Final   Value: 02/25/2014 17:15     Performed at [facility]   Culture     Final   Value:        BLOOD CULTURE RECEIVED NO GROWTH TO DATE CULTURE WILL BE HELD FOR 5 DAYS BEFORE ISSUING A FINAL NEGATIVE REPORT     Performed at [facility]   Report Status PENDING   Incomplete  CULTURE, BLOOD (ROUTINE X 2)     Status: None   Collection Time    02/25/14 12:40 PM      Result Value Ref Range Status   Specimen Description BLOOD LEFT ARM   Final   Special Requests BOTTLES DRAWN AEROBIC ONLY 10CC   Final   Culture  Setup Time     Final   Value: 02/25/2014 17:15     Performed at [facility]   Culture     Final   Value: DIPHTHEROIDS(CORYNEBACTERIUM SPECIES)     Note: Standardized susceptibility testing for this organism is not available.     Note: Gram Stain Report Called to,Read Back By and Verified With:  BECKA QIU;0143;02/28/14;SMIAS PREVIOUSLY REPORTED AS GRAM NEGATIVE RODS CORRECTED RESULTS CALLED TO: LAURA JAMA 03/01/14 @ 11:32AM BY RUSCOE A.     Performed at [facility]   Report Status 03/01/2014 FINAL   Final       Studies: Ct Abdomen Pelvis Wo Contrast  03/01/2014   CLINICAL DATA:  Right hydronephrosis and bilateral nephrolithiasis by ultrasound. Renal failure. History of lymphoma.  EXAM: CT ABDOMEN AND PELVIS WITHOUT CONTRAST  TECHNIQUE: Multidetector CT imaging of the abdomen and pelvis was  performed following the standard protocol without IV contrast.  COMPARISON:  Renal ultrasound on 02/23/2014 and PET scan on 12/10/2013.  FINDINGS: There is evidence significant right-sided hydronephrosis and hydroureter as visualized on the recent renal ultrasound study. [place] ureter is followed into the pelvis, there is a column of 3 adjacent significant sized calculi located roughly at the level of the mid sacrum. All 3 of the calculi measure approximately 10 mm in greatest diameter.  There are also a number of intrarenal calculi bilaterally. On the right side a 7 mm calculus is located in the lower pole collecting system. In the left kidney, the largest calculus measures 8 mm in the upper pole. There are at least 8 other smaller calculi dispersed throughout the left collecting system measuring on the order of roughly 2-3 mm each in size. No left-sided hydronephrosis or ureteral calculi on the left.  Unenhanced appearance of the liver, spleen, pancreas, adrenal glands and bowel are unremarkable. Layering higher density in the gallbladder lumen likely relates to calculi. No evidence of gallbladder distention, inflammation or biliary dilatation.  The bladder  is unremarkable. No free fluid or abscess is identified. There are some residual mildly prominent retroperitoneal lymph nodes identified which are similar in size compared to the prior PET scan and most prominent in the left periaortic region. Largest measures approximately 11 mm in short axis. No bony abnormalities are seen.  IMPRESSION: 1. Significant right-sided hydronephrosis and hydroureter secondary to a column of three roughly 10 mm diameter calculi stacked on top of one another in the distal ureter at the level of the mid sacrum. 2. Bilateral intrarenal calculi with single calculus in the lower pole of the right kidney and multiple calculi in the left kidney. The left kidney demonstrates no evidence of hydronephrosis. 3. Some residual mildly prominent left  para-aortic retroperitoneal lymph nodes similar in size to the PET scan in May.   Electronically Signed   By: Aletta Edouard M.D.   On: 03/01/2014 08:55   Ct Head Wo Contrast  03/01/2014   CLINICAL DATA:  33 year old male with left lower extremity weakness. 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 Initial encounter.  EXAM: CT HEAD WITHOUT CONTRAST  TECHNIQUE: Contiguous axial images were obtained from the base of the skull through the vertex without intravenous contrast.  COMPARISON:  Head CT without contrast 08/02/2013. Thoracic spine MRI 02/28/2014.  FINDINGS: Visualized paranasal sinuses and mastoids are clear. No osseous abnormality identified. Visualized orbits and scalp soft tissues are within normal limits.  Stable cerebral volume. No midline shift, mass effect, or evidence of intracranial mass lesion. Stable, normal ventricular size and configuration. Stable, normal basilar cisterns. No acute intracranial hemorrhage identified. Gray-white matter differentiation is within normal limits throughout the brain. No evidence of cortically based acute infarction identified. No suspicious intracranial vascular hyperdensity.  IMPRESSION: Stable and normal noncontrast CT appearance of the brain. No contraindication to lumbar puncture identified.   Electronically Signed   By: Lars Pinks M.D.   On: 03/01/2014 11:39   Mr Thoracic Spine W Wo Contrast  02/28/2014   CLINICAL DATA:  34 year old male with history of HIV, lymphoma, bacteremia. Left lower extremity numbness.  EXAM: MRI THORACIC SPINE WITHOUT AND WITH CONTRAST  MRI LUMBOSACRAL PLEXUS WITHOUT AND WITH CONTRAST.  TECHNIQUE: Multiplanar and multiecho pulse sequences of the thoracic spine and lumbosacral plexus were obtained without and with intravenous contrast.  CONTRAST:  76mL MULTIHANCE GADOBENATE DIMEGLUMINE 529 MG/ML IV SOLN  COMPARISON:  PET-CT 11/2013 and earlier. CT Abdomen and Pelvis 08/04/2013  FINDINGS: MR THORACIC SPINE FINDINGS  Limited sagittal  imaging of the cervical spine appears negative except for diffusely abnormal bone marrow signal, including at the visualized skullbase.  Preserved thoracic vertebral height and alignment, but diffusely abnormal bone marrow signal, heterogeneously decreased on T1 and T2 imaging. Marrow signal is heterogeneously increased on STIR, and following contrast there is widespread heterogeneous bone marrow enhancement.  Axial post-contrast images are degraded by motion. Sagittal post-contrast images indicate multifocal abnormal enhancement along the dorsal spinal cord or dorsal surface of the spinal cord. The levels T4 through T7 are most affected in the upper spine. At the T10-T11 level there is a larger area of abnormal enhancement which appears to extend farther into the substance of the cord (series 23 image 6), in this corresponds to abnormal T2 signal within the dorsal cord (series 7, image 32).  There is also subtle abnormal dorsal column increased T2 signal in the upper cord corresponding to the areas of enhancement, T4 -T6.  No thoracic spinal cord expansion. Capacious thoracic thecal sac. No spinal stenosis. Conus  medullaris is not included on these images.  No epidural process in the thoracic spine.  Trace layering right pleural effusion. Mildly increased signal in the deep tendon right lower lobe. Visualized upper abdominal viscera remarkable for decreased T2 signal in the liver and spleen.  MR LUMBOSACRAL PLEXUS FINDINGS  Study is intermittently degraded by motion artifact despite repeated imaging attempts.  The entire lumbar spine is not included. Diffusely abnormal bone marrow signal. These images suggest transitional lumbosacral anatomy, uncertain when compared to the comparison exams.  Continued right hydronephrosis and right hydroureter related to an obstructing calculus at the level of the right sacrum, better demonstrated on 11/2013.  The conus medullaris is not included. Cauda equina nerve roots appear  within normal limits on precontrast images. Suggestion of abnormal enhancement of the left S1 or S2 nerve roots at the L5-S1 level (series 20, image 16 and series 21, image 40. No other abnormal cauda equina enhancement identified.  Capacious lumbar spinal canal.  No spinal stenosis.  The exiting nerves of the lumbosacral plexus appear within normal limits. Mild vascular artifact related to the common iliac arteries and iliac bifurcations.  IMPRESSION: 1. 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 But there is a larger enhancing plaque at the T10-T11 level dorsally which appears to involve the substance of the cord. 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 Follow-up CSF analysis may confirm, but recommend Head CT prior to lumbar puncture to exclude intracranial mass effect. 2. Lumbosacral plexus was imaged and appears within normal limits. However, there is suggestion of abnormal enhancement of the left S1 or S2 nerve roots in the cauda equina at the L5-S1 level. 3. 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 Bone marrow treatment effect or hemosiderosis are less likely. 4. Chronic right side obstructive uropathy; moderate to severe right hydronephrosis and hydroureter due to an obstructing calculus at the level of the right sacrum.   Electronically Signed   By: Lars Pinks M.D.   On: 02/28/2014 21:59   Mr Lumbar Spine W Wo Contrast  02/28/2014   CLINICAL DATA:  33 year old male with history of HIV, lymphoma, bacteremia. Left lower extremity numbness.  EXAM: MRI THORACIC SPINE WITHOUT AND WITH CONTRAST  MRI LUMBOSACRAL PLEXUS WITHOUT AND WITH CONTRAST.  TECHNIQUE: Multiplanar and multiecho pulse sequences of the thoracic spine and lumbosacral plexus were obtained without and with intravenous contrast.  CONTRAST:  67mL MULTIHANCE GADOBENATE DIMEGLUMINE 529  MG/ML IV SOLN  COMPARISON:  PET-CT 11/2013 and earlier. CT Abdomen and Pelvis 08/04/2013  FINDINGS: MR THORACIC SPINE FINDINGS  Limited sagittal imaging of the cervical spine appears negative except for diffusely abnormal bone marrow signal, including at the visualized skullbase.  Preserved thoracic vertebral height and alignment, but diffusely abnormal bone marrow signal, heterogeneously decreased on T1 and T2 imaging. Marrow signal is heterogeneously increased on STIR, and following contrast there is widespread heterogeneous bone marrow enhancement.  Axial post-contrast images are degraded by motion. Sagittal post-contrast images indicate multifocal abnormal enhancement along the dorsal spinal cord or dorsal surface of the spinal cord. The levels T4 through T7 are most affected in the upper spine. At the T10-T11 level there is a larger area of abnormal enhancement which appears to extend farther into the substance of the cord (series 23 image 6), in this corresponds to abnormal T2 signal within the dorsal cord (series 7, image 32).  There is also  subtle abnormal dorsal column increased T2 signal in the upper cord corresponding to the areas of enhancement, T4 -T6.  No thoracic spinal cord expansion. Capacious thoracic thecal sac. No spinal stenosis. Conus medullaris is not included on these images.  No epidural process in the thoracic spine.  Trace layering right pleural effusion. Mildly increased signal in the deep tendon right lower lobe. Visualized upper abdominal viscera remarkable for decreased T2 signal in the liver and spleen.  MR LUMBOSACRAL PLEXUS FINDINGS  Study is intermittently degraded by motion artifact despite repeated imaging attempts.  The entire lumbar spine is not included. Diffusely abnormal bone marrow signal. These images suggest transitional lumbosacral anatomy, uncertain when compared to the comparison exams.  Continued right hydronephrosis and right hydroureter related to an obstructing  calculus at the level of the right sacrum, better demonstrated on 11/2013.  The conus medullaris is not included. Cauda equina nerve roots appear within normal limits on precontrast images. Suggestion of abnormal enhancement of the left S1 or S2 nerve roots at the L5-S1 level (series 20, image 16 and series 21, image 40. No other abnormal cauda equina enhancement identified.  Capacious lumbar spinal canal.  No spinal stenosis.  The exiting nerves of the lumbosacral plexus appear within normal limits. Mild vascular artifact related to the common iliac arteries and iliac bifurcations.  IMPRESSION: 1. 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 But there is a larger enhancing plaque at the T10-T11 level dorsally which appears to involve the substance of the cord. 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 Follow-up CSF analysis may confirm, but recommend Head CT prior to lumbar puncture to exclude intracranial mass effect. 2. Lumbosacral plexus was imaged and appears within normal limits. However, there is suggestion of abnormal enhancement of the left S1 or S2 nerve roots in the cauda equina at the L5-S1 level. 3. 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 Bone marrow treatment effect or hemosiderosis are less likely. 4. Chronic right side obstructive uropathy; moderate to severe right hydronephrosis and hydroureter due to an obstructing calculus at the level of the right sacrum.   Electronically Signed   By: Lars Pinks M.D.   On: 02/28/2014 21:59   Ir Perc Nephrostomy Right  03/01/2014   CLINICAL DATA:  33 year old with HIV, lymphoma and right hydronephrosis due to a right ureter stone. 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  EXAM: FLUOROSCOPIC GUIDED LUMBAR PUNCTURE  ULTRASOUND AND FLUOROSCOPIC GUIDED PLACEMENT OF RIGHT NEPHROSTOMY  TUBE.  Physician: Stephan Minister. Henn, MD  FLUOROSCOPY TIME:  3 min and 36 seconds  MEDICATIONS: 100 mcg fentanyl, 2 mg versed. A radiology nurse monitored the patient for moderate sedation.  ANESTHESIA/SEDATION: Moderate sedation time: 22 min  PROCEDURE: The procedures were explained to the patient. The risks and benefits of the procedure were discussed and the patient's questions were addressed. Informed consent was obtained from the patient.  The patient was placed prone on the interventional table. The skin was marked at the level of L4-L5. The back and right flank were prepped and draped in sterile fashion. Maximal barrier sterile technique was utilized including caps, mask, sterile gowns, sterile gloves, sterile drape, hand hygiene and skin antiseptic. 1% lidocaine was used for local anesthetic. A 20 gauge spinal needle was directed into the thecal sac along the right side of L4-L5. Clear CSF was identified. The opening pressure was  roughly 20 cm. Approximately 10 mL of CSF was removed and sent for analysis. The stylet was placed back in the needle and the needle was removed without complication. Bandage was placed over the lumbar puncture site.  Attention was directed to the right kidney. Ultrasound demonstrated moderate right hydronephrosis. 1% lidocaine was used for local anesthetic in the right flank. A 21 gauge needle was directed into a dilated lower pole calyx with ultrasound guidance. Needle position was confirmed within the collecting system. A 0.018 wire was advanced to the collecting system and an Accustick dilator set was placed. Cloudy urine was aspirated from the catheter. A Bentson wire was advanced into the proximal ureter. The tract was dilated and a 10.2 [place] multipurpose drain was advanced over the wire. Tube was reconstituted in the renal pelvis. Additional cloudy fluid was removed. Urine was sent for culture. Catheter was sutured to the skin and attached to gravity bag. Fluoroscopic and  ultrasound images were taken and saved for documentation.  FINDINGS: Lumbar puncture: 10 mL of clear CSF was removed. Opening pressure was approximately 20 cm.  Nephrostomy tube: Moderate right hydronephrosis. Cloudy urine was removed from the right kidney. Nephrostomy tube is reconstituted in the renal pelvis.  COMPLICATIONS: None  IMPRESSION: Successful placement of a right percutaneous nephrostomy tube with ultrasound and fluoroscopic guidance. Fluid was sent for Gram stain and culture.  Successful fluoroscopic guided lumbar puncture.   Electronically Signed   By: Markus Daft M.D.   On: 03/01/2014 14:53   Ir US Guide Bx Asp/drain  03/01/2014   CLINICAL DATA:  33 year old with HIV, lymphoma and right hydronephrosis due to a right ureter stone. 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  EXAM: FLUOROSCOPIC GUIDED LUMBAR PUNCTURE  ULTRASOUND AND FLUOROSCOPIC GUIDED PLACEMENT OF RIGHT NEPHROSTOMY TUBE.  Physician: Stephan Minister. Henn, MD  FLUOROSCOPY TIME:  3 min and 36 seconds  MEDICATIONS: 100 mcg fentanyl, 2 mg versed. A radiology nurse monitored the patient for moderate sedation.  ANESTHESIA/SEDATION: Moderate sedation time: 22 min  PROCEDURE: The procedures were explained to the patient. The risks and benefits of the procedure were discussed and the patient's questions were addressed. Informed consent was obtained from the patient.  The patient was placed prone on the interventional table. The skin was marked at the level of L4-L5. The back and right flank were prepped and draped in sterile fashion. Maximal barrier sterile technique was utilized including caps, mask, sterile gowns, sterile gloves, sterile drape, hand hygiene and skin antiseptic. 1% lidocaine was used for local anesthetic. A 20 gauge spinal needle was directed into the thecal sac along the right side of L4-L5. Clear CSF was identified. The opening pressure was roughly 20 cm. Approximately 10 mL of CSF was removed and sent for  analysis. The stylet was placed back in the needle and the needle was removed without complication. Bandage was placed over the lumbar puncture site.  Attention was directed to the right kidney. Ultrasound demonstrated moderate right hydronephrosis. 1% lidocaine was used for local anesthetic in the right flank. A 21 gauge needle was directed into a dilated lower pole calyx with ultrasound guidance. Needle position was confirmed within the collecting system. A 0.018 wire was advanced to the collecting system and an Accustick dilator set was placed. Cloudy urine was aspirated from the catheter. A Bentson wire was advanced into the proximal ureter. The tract was dilated and a 10.2 [place] multipurpose drain was advanced over the wire. Tube was reconstituted in the  renal pelvis. Additional cloudy fluid was removed. Urine was sent for culture. Catheter was sutured to the skin and attached to gravity bag. Fluoroscopic and ultrasound images were taken and saved for documentation.  FINDINGS: Lumbar puncture: 10 mL of clear CSF was removed. Opening pressure was approximately 20 cm.  Nephrostomy tube: Moderate right hydronephrosis. Cloudy urine was removed from the right kidney. Nephrostomy tube is reconstituted in the renal pelvis.  COMPLICATIONS: None  IMPRESSION: Successful placement of a right percutaneous nephrostomy tube with ultrasound and fluoroscopic guidance. Fluid was sent for Gram stain and culture.  Successful fluoroscopic guided lumbar puncture.   Electronically Signed   By: Markus Daft M.D.   On: 03/01/2014 14:53   Ir Fluoro Guide Ndl Plmt / Bx  03/01/2014   CLINICAL DATA:  33 year old with HIV, lymphoma and right hydronephrosis due to a right ureter stone. 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  EXAM: FLUOROSCOPIC GUIDED LUMBAR PUNCTURE  ULTRASOUND AND FLUOROSCOPIC GUIDED PLACEMENT OF RIGHT NEPHROSTOMY TUBE.  Physician: Stephan Minister. Henn, MD  FLUOROSCOPY TIME:  3 min and 36 seconds   MEDICATIONS: 100 mcg fentanyl, 2 mg versed. A radiology nurse monitored the patient for moderate sedation.  ANESTHESIA/SEDATION: Moderate sedation time: 22 min  PROCEDURE: The procedures were explained to the patient. The risks and benefits of the procedure were discussed and the patient's questions were addressed. Informed consent was obtained from the patient.  The patient was placed prone on the interventional table. The skin was marked at the level of L4-L5. The back and right flank were prepped and draped in sterile fashion. Maximal barrier sterile technique was utilized including caps, mask, sterile gowns, sterile gloves, sterile drape, hand hygiene and skin antiseptic. 1% lidocaine was used for local anesthetic. A 20 gauge spinal needle was directed into the thecal sac along the right side of L4-L5. Clear CSF was identified. The opening pressure was roughly 20 cm. Approximately 10 mL of CSF was removed and sent for analysis. The stylet was placed back in the needle and the needle was removed without complication. Bandage was placed over the lumbar puncture site.  Attention was directed to the right kidney. Ultrasound demonstrated moderate right hydronephrosis. 1% lidocaine was used for local anesthetic in the right flank. A 21 gauge needle was directed into a dilated lower pole calyx with ultrasound guidance. Needle position was confirmed within the collecting system. A 0.018 wire was advanced to the collecting system and an Accustick dilator set was placed. Cloudy urine was aspirated from the catheter. A Bentson wire was advanced into the proximal ureter. The tract was dilated and a 10.2 [place] multipurpose drain was advanced over the wire. Tube was reconstituted in the renal pelvis. Additional cloudy fluid was removed. Urine was sent for culture. Catheter was sutured to the skin and attached to gravity bag. Fluoroscopic and ultrasound images were taken and saved for documentation.  FINDINGS: Lumbar  puncture: 10 mL of clear CSF was removed. Opening pressure was approximately 20 cm.  Nephrostomy tube: Moderate right hydronephrosis. Cloudy urine was removed from the right kidney. Nephrostomy tube is reconstituted in the renal pelvis.  COMPLICATIONS: None  IMPRESSION: Successful placement of a right percutaneous nephrostomy tube with ultrasound and fluoroscopic guidance. Fluid was sent for Gram stain and culture.  Successful fluoroscopic guided lumbar puncture.   Electronically Signed   By: Markus Daft M.D.   On: 03/01/2014 14:53        Scheduled Meds: . calcium carbonate  800  mg of elemental calcium Oral TID  .  ceFAZolin (ANCEF) IV  2 g Intravenous 3 times per day  . clotrimazole-betamethasone   Topical BID  . feeding supplement (ENSURE COMPLETE)  237 mL Oral TID BM  . fentaNYL      . ferrous sulfate  325 mg Oral BID WC  . lidocaine      . midazolam       Continuous Infusions:    Principal Problem:   Staphylococcus aureus bacteremia Active Problems:   HIV disease   Hodgkin's disease   Hypokalemia   Thrombocytopenia   Acute renal failure   Shingles rash   Metabolic acidosis, normal anion gap (NAG)   CLABSI (central line-associated bloodstream infection)   Numbness of left lower extremity    Time spent: 45 minutes.    Vernell Leep, MD, FACP, FHM. [facility] Pager [phone]  If 7PM-7AM, please contact night-coverage [email or web address] Password [email or web address] 03/01/2014, 3:01 PM    LOS: 6 days

## 2014-03-01 NOTE — Progress Notes (Signed)
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 But there is a larger enhancing plaque at the  T10-T11 level dorsally which appears to involve the substance of the  cord. 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  Antibiotics:  Anti-infectives   Start     Dose/Rate Route Frequency Ordered Stop   03/01/14 1200  cefTRIAXone (ROCEPHIN) 1 g in dextrose 5 % 50 mL IVPB     1 g 100 mL/hr over 30 Minutes Intravenous  Once 03/01/14 1144 03/01/14 1306   02/25/14 2200  emtricitabine-tenofovir (TRUVADA) 200-300 MG per tablet 1 tablet  Status:  Discontinued     1 tablet Oral Every 48 hours 02/24/14 1443 02/25/14 1448   02/25/14 1615  ceFAZolin (ANCEF) IVPB 2 g/50 mL premix     2 g 100 mL/hr over 30 Minutes Intravenous 3 times per day 02/25/14 1600     02/24/14 2315  vancomycin (VANCOCIN) IVPB 1000 mg/200 mL premix  Status:  Discontinued     1,000 mg 200 mL/hr over 60 Minutes Intravenous Every 24 hours 02/24/14 2313 02/26/14 0946   02/24/14 2200  efavirenz (SUSTIVA) tablet 600 mg  Status:  Discontinued     600 mg Oral Daily at bedtime 02/24/14 1443 02/25/14 1448   02/23/14 2200  efavirenz-emtricitabine-tenofovir (ATRIPLA) 600-200-300 MG per tablet 1 tablet  Status:  Discontinued     1 tablet Oral Daily at bedtime 02/23/14 2045 02/24/14 1442      Medications: Scheduled Meds: . calcium carbonate  800 mg of elemental calcium Oral TID  .  ceFAZolin (ANCEF) IV  2 g Intravenous 3 times per day  . clotrimazole-betamethasone   Topical BID  . feeding supplement (ENSURE COMPLETE)  237 mL Oral TID BM  . fentaNYL      . ferrous sulfate  325 mg Oral BID WC  . lidocaine      . midazolam       Continuous Infusions: . sodium chloride 75 mL/hr at 03/01/14  1741   PRN Meds:.    Objective: Weight change: -4 lb 4.8 oz (-1.95 kg)  Intake/Output Summary (Last 24 hours) at 03/01/14 2107 Last data filed at 03/01/14 2010  Gross per 24 hour  Intake    480 ml  Output   1335 ml  Net   -855 ml   Blood pressure 91/54, pulse 94, temperature 98.7 F (37.1 C), temperature source Oral, resp. rate 17, height 6' 0.84" (1.85 m), weight 132 lb 11.2 oz (60.192 kg), SpO2 100.00%. Temp:  [97.5 F (36.4 C)-98.7 F (37.1 C)] 98.7 F (37.1 C) (08/09 2009) Pulse Rate:  [70-94] 94 (08/09 2009) Resp:  [15-22] 17 (08/09 2009) BP: (67-100)/(40-65) 91/54 mmHg (08/09 2009) SpO2:  [97 %-100 %] 100 % (08/09 2009) Weight:  [132 lb 11.2 oz (60.192 kg)] 132 lb 11.2 oz (60.192 kg) (08/09 2009)  Physical Exam: General: Alert and awake, oriented x3, not in any acute distress. HEENT:pupils reactive to light and accommodation, EOMI CVS regular rate,  Chest:no wheezing Abdomen: , nondistended, normal bowel sounds, Neuro: nonfocal  CBC:  Recent Labs Lab 02/23/14 1722 02/24/14 0340 02/25/14 0540 02/27/14 0700 03/01/14 1143  HGB 9.9* 7.8* 7.8* 8.0*  --   HCT 28.5* 22.7* 23.2* 23.4*  --   PLT 88*  94* 92* 158  --   INR  --   --   --   --  1.16  APTT  --   --   --   --  42*     BMET  Recent Labs  02/27/14 0700  NA 140  K 4.3  CL 106  CO2 21  GLUCOSE 88  BUN 12  CREATININE 1.64*  CALCIUM 8.3*     Liver Panel  No results found for this basename: PROT, ALBUMIN, AST, ALT, ALKPHOS, BILITOT, BILIDIR, IBILI,  in the last 72 hours     Sedimentation Rate No results found for this basename: ESRSEDRATE,  in the last 72 hours C-Reactive Protein No results found for this basename: CRP,  in the last 72 hours  Micro Results: Recent Results (from the past 720 hour(s))  CULTURE, BLOOD (ROUTINE X 2)     Status: None   Collection Time    02/23/14  6:08 PM      Result Value Ref Range Status   Specimen Description BLOOD RIGHT ARM   Final   Special  Requests BOTTLES DRAWN AEROBIC AND ANAEROBIC 10CC   Final   Culture  Setup Time     Final   Value: 02/23/2014 22:16     Performed at [facility]   Culture     Final   Value: STAPHYLOCOCCUS AUREUS     Note: RIFAMPIN AND GENTAMICIN SHOULD NOT BE USED AS SINGLE DRUGS FOR TREATMENT OF STAPH INFECTIONS. This organism is presumed to be Clindamycin resistant based on detection of inducible Clindamycin resistance.     Note: Gram Stain Report Called to,Read Back By and Verified With: ANDY BRAKE@3 :00PM ON 02/24/14 BY DANTS     Performed at [facility]   Report Status 02/26/2014 FINAL   Final   Organism ID, Bacteria STAPHYLOCOCCUS AUREUS   Final  CULTURE, BLOOD (ROUTINE X 2)     Status: None   Collection Time    02/23/14  6:30 PM      Result Value Ref Range Status   Specimen Description BLOOD LEFT ARM   Final   Special Requests BOTTLES DRAWN AEROBIC AND ANAEROBIC 10CC   Final   Culture  Setup Time     Final   Value: 02/23/2014 22:16     Performed at [facility]   Culture     Final   Value: STAPHYLOCOCCUS AUREUS     Note: SUSCEPTIBILITIES PERFORMED ON PREVIOUS CULTURE WITHIN THE LAST 5 DAYS.     Note: Gram Stain Report Called to,Read Back By and Verified With: TY HICKS ON 02/24/2014 AT 10:44P BY WILEJ     Performed at [facility]   Report Status 02/26/2014 FINAL   Final  URINE CULTURE     Status: None   Collection Time    02/23/14  9:46 PM      Result Value Ref Range Status   Specimen Description URINE, CLEAN CATCH   Final   Special Requests NONE   Final   Culture  Setup Time     Final   Value: 02/24/2014 04:09     Performed at [place]     Final   Value: NO GROWTH     Performed at [facility]   Culture     Final   Value: NO GROWTH     Performed at [facility]   Report Status 02/25/2014 FINAL   Final  GC/CHLAMYDIA  PROBE AMP     Status: None   Collection Time    02/23/14  9:46 PM      Result Value Ref Range  Status   CT Probe RNA NEGATIVE  NEGATIVE Final   GC Probe RNA NEGATIVE  NEGATIVE Final   Comment: (NOTE)                                                                                               **Normal Reference Range: Negative**          Assay performed using the Gen-Probe APTIMA COMBO2 (R) Assay.     Acceptable specimen types for this assay include APTIMA Swabs (Unisex,     endocervical, urethral, or vaginal), first void urine, and ThinPrep     liquid based cytology samples.     Performed at [facility], BLOOD (ROUTINE X 2)     Status: None   Collection Time    02/25/14 12:20 PM      Result Value Ref Range Status   Specimen Description BLOOD LEFT ARM   Final   Special Requests BOTTLES DRAWN AEROBIC AND ANAEROBIC 10CC   Final   Culture  Setup Time     Final   Value: 02/25/2014 17:15     Performed at [facility]   Culture     Final   Value:        BLOOD CULTURE RECEIVED NO GROWTH TO DATE CULTURE WILL BE HELD FOR 5 DAYS BEFORE ISSUING A FINAL NEGATIVE REPORT     Performed at [facility]   Report Status PENDING   Incomplete  CULTURE, BLOOD (ROUTINE X 2)     Status: None   Collection Time    02/25/14 12:40 PM      Result Value Ref Range Status   Specimen Description BLOOD LEFT ARM   Final   Special Requests BOTTLES DRAWN AEROBIC ONLY 10CC   Final   Culture  Setup Time     Final   Value: 02/25/2014 17:15     Performed at [facility]   Culture     Final   Value: DIPHTHEROIDS(CORYNEBACTERIUM SPECIES)     Note: Standardized susceptibility testing for this organism is not available.     Note: Gram Stain Report Called to,Read Back By and Verified With:  BECKA QIU;0143;02/28/14;SMIAS PREVIOUSLY REPORTED AS GRAM NEGATIVE RODS CORRECTED RESULTS CALLED TO: LAURA JAMA 03/01/14 @ 11:32AM BY RUSCOE A.     Performed at [facility]   Report Status 03/01/2014 FINAL   Final  GRAM STAIN     Status: None   Collection Time    03/01/14  2:25 PM       Result Value Ref Range Status   Specimen Description CSF   Final   Special Requests NONE   Final   Gram Stain     Final   Value: CYTOSPIN SLIDE     WBC PRESENT,BOTH PMN AND MONONUCLEAR     NO ORGANISMS SEEN   Report Status 03/01/2014 FINAL   Final    Studies/Results: Ct Abdomen Pelvis  Wo Contrast  03/01/2014   CLINICAL DATA:  Right hydronephrosis and bilateral nephrolithiasis by ultrasound. Renal failure. History of lymphoma.  EXAM: CT ABDOMEN AND PELVIS WITHOUT CONTRAST  TECHNIQUE: Multidetector CT imaging of the abdomen and pelvis was performed following the standard protocol without IV contrast.  COMPARISON:  Renal ultrasound on 02/23/2014 and PET scan on 12/10/2013.  FINDINGS: There is evidence significant right-sided hydronephrosis and hydroureter as visualized on the recent renal ultrasound study. [place] ureter is followed into the pelvis, there is a column of 3 adjacent significant sized calculi located roughly at the level of the mid sacrum. All 3 of the calculi measure approximately 10 mm in greatest diameter.  There are also a number of intrarenal calculi bilaterally. On the right side a 7 mm calculus is located in the lower pole collecting system. In the left kidney, the largest calculus measures 8 mm in the upper pole. There are at least 8 other smaller calculi dispersed throughout the left collecting system measuring on the order of roughly 2-3 mm each in size. No left-sided hydronephrosis or ureteral calculi on the left.  Unenhanced appearance of the liver, spleen, pancreas, adrenal glands and bowel are unremarkable. Layering higher density in the gallbladder lumen likely relates to calculi. No evidence of gallbladder distention, inflammation or biliary dilatation.  The bladder is unremarkable. No free fluid or abscess is identified. There are some residual mildly prominent retroperitoneal lymph nodes identified which are similar in size compared to the prior PET scan and most prominent in  the left periaortic region. Largest measures approximately 11 mm in short axis. No bony abnormalities are seen.  IMPRESSION: 1. Significant right-sided hydronephrosis and hydroureter secondary to a column of three roughly 10 mm diameter calculi stacked on top of one another in the distal ureter at the level of the mid sacrum. 2. Bilateral intrarenal calculi with single calculus in the lower pole of the right kidney and multiple calculi in the left kidney. The left kidney demonstrates no evidence of hydronephrosis. 3. Some residual mildly prominent left para-aortic retroperitoneal lymph nodes similar in size to the PET scan in May.   Electronically Signed   By: Aletta Edouard M.D.   On: 03/01/2014 08:55   Ct Head Wo Contrast  03/01/2014   CLINICAL DATA:  33 year old male with left lower extremity weakness. 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 Initial encounter.  EXAM: CT HEAD WITHOUT CONTRAST  TECHNIQUE: Contiguous axial images were obtained from the base of the skull through the vertex without intravenous contrast.  COMPARISON:  Head CT without contrast 08/02/2013. Thoracic spine MRI 02/28/2014.  FINDINGS: Visualized paranasal sinuses and mastoids are clear. No osseous abnormality identified. Visualized orbits and scalp soft tissues are within normal limits.  Stable cerebral volume. No midline shift, mass effect, or evidence of intracranial mass lesion. Stable, normal ventricular size and configuration. Stable, normal basilar cisterns. No acute intracranial hemorrhage identified. Gray-white matter differentiation is within normal limits throughout the brain. No evidence of cortically based acute infarction identified. No suspicious intracranial vascular hyperdensity.  IMPRESSION: Stable and normal noncontrast CT appearance of the brain. No contraindication to lumbar puncture identified.   Electronically Signed   By: Lars Pinks M.D.   On: 03/01/2014 11:39   Mr Thoracic Spine W Wo Contrast  02/28/2014    CLINICAL DATA:  33 year old male with history of HIV, lymphoma, bacteremia. Left lower extremity numbness.  EXAM: MRI THORACIC SPINE WITHOUT AND WITH CONTRAST  MRI LUMBOSACRAL PLEXUS WITHOUT AND WITH CONTRAST.  TECHNIQUE: Multiplanar and multiecho pulse sequences of the thoracic spine and lumbosacral plexus were obtained without and with intravenous contrast.  CONTRAST:  55mL MULTIHANCE GADOBENATE DIMEGLUMINE 529 MG/ML IV SOLN  COMPARISON:  PET-CT 11/2013 and earlier. CT Abdomen and Pelvis 08/04/2013  FINDINGS: MR THORACIC SPINE FINDINGS  Limited sagittal imaging of the cervical spine appears negative except for diffusely abnormal bone marrow signal, including at the visualized skullbase.  Preserved thoracic vertebral height and alignment, but diffusely abnormal bone marrow signal, heterogeneously decreased on T1 and T2 imaging. Marrow signal is heterogeneously increased on STIR, and following contrast there is widespread heterogeneous bone marrow enhancement.  Axial post-contrast images are degraded by motion. Sagittal post-contrast images indicate multifocal abnormal enhancement along the dorsal spinal cord or dorsal surface of the spinal cord. The levels T4 through T7 are most affected in the upper spine. At the T10-T11 level there is a larger area of abnormal enhancement which appears to extend farther into the substance of the cord (series 23 image 6), in this corresponds to abnormal T2 signal within the dorsal cord (series 7, image 32).  There is also subtle abnormal dorsal column increased T2 signal in the upper cord corresponding to the areas of enhancement, T4 -T6.  No thoracic spinal cord expansion. Capacious thoracic thecal sac. No spinal stenosis. Conus medullaris is not included on these images.  No epidural process in the thoracic spine.  Trace layering right pleural effusion. Mildly increased signal in the deep tendon right lower lobe. Visualized upper abdominal viscera remarkable for decreased T2  signal in the liver and spleen.  MR LUMBOSACRAL PLEXUS FINDINGS  Study is intermittently degraded by motion artifact despite repeated imaging attempts.  The entire lumbar spine is not included. Diffusely abnormal bone marrow signal. These images suggest transitional lumbosacral anatomy, uncertain when compared to the comparison exams.  Continued right hydronephrosis and right hydroureter related to an obstructing calculus at the level of the right sacrum, better demonstrated on 11/2013.  The conus medullaris is not included. Cauda equina nerve roots appear within normal limits on precontrast images. Suggestion of abnormal enhancement of the left S1 or S2 nerve roots at the L5-S1 level (series 20, image 16 and series 21, image 40. No other abnormal cauda equina enhancement identified.  Capacious lumbar spinal canal.  No spinal stenosis.  The exiting nerves of the lumbosacral plexus appear within normal limits. Mild vascular artifact related to the common iliac arteries and iliac bifurcations.  IMPRESSION: 1. 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 But there is a larger enhancing plaque at the T10-T11 level dorsally which appears to involve the substance of the cord. 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 Follow-up CSF analysis may confirm, but recommend Head CT prior to lumbar puncture to exclude intracranial mass effect. 2. Lumbosacral plexus was imaged and appears within normal limits. However, there is suggestion of abnormal enhancement of the left S1 or S2 nerve roots in the cauda equina at the L5-S1 level. 3. 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 Bone marrow treatment effect or hemosiderosis are less likely. 4. Chronic right side obstructive uropathy; moderate to severe right hydronephrosis and hydroureter due to an obstructing calculus at the  level of the right sacrum.   Electronically Signed   By: Lars Pinks M.D.   On: 02/28/2014 21:59   Mr Lumbar Spine W Wo Contrast  02/28/2014  CLINICAL DATA:  33 year old male with history of HIV, lymphoma, bacteremia. Left lower extremity numbness.  EXAM: MRI THORACIC SPINE WITHOUT AND WITH CONTRAST  MRI LUMBOSACRAL PLEXUS WITHOUT AND WITH CONTRAST.  TECHNIQUE: Multiplanar and multiecho pulse sequences of the thoracic spine and lumbosacral plexus were obtained without and with intravenous contrast.  CONTRAST:  7mL MULTIHANCE GADOBENATE DIMEGLUMINE 529 MG/ML IV SOLN  COMPARISON:  PET-CT 11/2013 and earlier. CT Abdomen and Pelvis 08/04/2013  FINDINGS: MR THORACIC SPINE FINDINGS  Limited sagittal imaging of the cervical spine appears negative except for diffusely abnormal bone marrow signal, including at the visualized skullbase.  Preserved thoracic vertebral height and alignment, but diffusely abnormal bone marrow signal, heterogeneously decreased on T1 and T2 imaging. Marrow signal is heterogeneously increased on STIR, and following contrast there is widespread heterogeneous bone marrow enhancement.  Axial post-contrast images are degraded by motion. Sagittal post-contrast images indicate multifocal abnormal enhancement along the dorsal spinal cord or dorsal surface of the spinal cord. The levels T4 through T7 are most affected in the upper spine. At the T10-T11 level there is a larger area of abnormal enhancement which appears to extend farther into the substance of the cord (series 23 image 6), in this corresponds to abnormal T2 signal within the dorsal cord (series 7, image 32).  There is also subtle abnormal dorsal column increased T2 signal in the upper cord corresponding to the areas of enhancement, T4 -T6.  No thoracic spinal cord expansion. Capacious thoracic thecal sac. No spinal stenosis. Conus medullaris is not included on these images.  No epidural process in the thoracic spine.  Trace layering right  pleural effusion. Mildly increased signal in the deep tendon right lower lobe. Visualized upper abdominal viscera remarkable for decreased T2 signal in the liver and spleen.  MR LUMBOSACRAL PLEXUS FINDINGS  Study is intermittently degraded by motion artifact despite repeated imaging attempts.  The entire lumbar spine is not included. Diffusely abnormal bone marrow signal. These images suggest transitional lumbosacral anatomy, uncertain when compared to the comparison exams.  Continued right hydronephrosis and right hydroureter related to an obstructing calculus at the level of the right sacrum, better demonstrated on 11/2013.  The conus medullaris is not included. Cauda equina nerve roots appear within normal limits on precontrast images. Suggestion of abnormal enhancement of the left S1 or S2 nerve roots at the L5-S1 level (series 20, image 16 and series 21, image 40. No other abnormal cauda equina enhancement identified.  Capacious lumbar spinal canal.  No spinal stenosis.  The exiting nerves of the lumbosacral plexus appear within normal limits. Mild vascular artifact related to the common iliac arteries and iliac bifurcations.  IMPRESSION: 1. 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 But there is a larger enhancing plaque at the T10-T11 level dorsally which appears to involve the substance of the cord. 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 Follow-up CSF analysis may confirm, but recommend Head CT prior to lumbar puncture to exclude intracranial mass effect. 2. Lumbosacral plexus was imaged and appears within normal limits. However, there is suggestion of abnormal enhancement of the left S1 or S2 nerve roots in the cauda equina at the L5-S1 level. 3. 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 Bone marrow treatment effect or hemosiderosis are less  likely. 4. Chronic right side obstructive uropathy; moderate to severe right hydronephrosis and hydroureter due to an obstructing calculus at the  level of the right sacrum.   Electronically Signed   By: Lars Pinks M.D.   On: 02/28/2014 21:59   Ir Perc Nephrostomy Right  03/01/2014   CLINICAL DATA:  33 year old with HIV, lymphoma and right hydronephrosis due to a right ureter stone. 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  EXAM: FLUOROSCOPIC GUIDED LUMBAR PUNCTURE  ULTRASOUND AND FLUOROSCOPIC GUIDED PLACEMENT OF RIGHT NEPHROSTOMY TUBE.  Physician: Stephan Minister. Henn, MD  FLUOROSCOPY TIME:  3 min and 36 seconds  MEDICATIONS: 100 mcg fentanyl, 2 mg versed. A radiology nurse monitored the patient for moderate sedation.  ANESTHESIA/SEDATION: Moderate sedation time: 22 min  PROCEDURE: The procedures were explained to the patient. The risks and benefits of the procedure were discussed and the patient's questions were addressed. Informed consent was obtained from the patient.  The patient was placed prone on the interventional table. The skin was marked at the level of L4-L5. The back and right flank were prepped and draped in sterile fashion. Maximal barrier sterile technique was utilized including caps, mask, sterile gowns, sterile gloves, sterile drape, hand hygiene and skin antiseptic. 1% lidocaine was used for local anesthetic. A 20 gauge spinal needle was directed into the thecal sac along the right side of L4-L5. Clear CSF was identified. The opening pressure was roughly 20 cm. Approximately 10 mL of CSF was removed and sent for analysis. The stylet was placed back in the needle and the needle was removed without complication. Bandage was placed over the lumbar puncture site.  Attention was directed to the right kidney. Ultrasound demonstrated moderate right hydronephrosis. 1% lidocaine was used for local anesthetic in the right flank. A 21 gauge needle was directed into a dilated lower pole  calyx with ultrasound guidance. Needle position was confirmed within the collecting system. A 0.018 wire was advanced to the collecting system and an Accustick dilator set was placed. Cloudy urine was aspirated from the catheter. A Bentson wire was advanced into the proximal ureter. The tract was dilated and a 10.2 [place] multipurpose drain was advanced over the wire. Tube was reconstituted in the renal pelvis. Additional cloudy fluid was removed. Urine was sent for culture. Catheter was sutured to the skin and attached to gravity bag. Fluoroscopic and ultrasound images were taken and saved for documentation.  FINDINGS: Lumbar puncture: 10 mL of clear CSF was removed. Opening pressure was approximately 20 cm.  Nephrostomy tube: Moderate right hydronephrosis. Cloudy urine was removed from the right kidney. Nephrostomy tube is reconstituted in the renal pelvis.  COMPLICATIONS: None  IMPRESSION: Successful placement of a right percutaneous nephrostomy tube with ultrasound and fluoroscopic guidance. Fluid was sent for Gram stain and culture.  Successful fluoroscopic guided lumbar puncture.   Electronically Signed   By: Markus Daft M.D.   On: 03/01/2014 14:53   Ir US Guide Bx Asp/drain  03/01/2014   CLINICAL DATA:  33 year old with HIV, lymphoma and right hydronephrosis due to a right ureter stone. 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  EXAM: FLUOROSCOPIC GUIDED LUMBAR PUNCTURE  ULTRASOUND AND FLUOROSCOPIC GUIDED PLACEMENT OF RIGHT NEPHROSTOMY TUBE.  Physician: Stephan Minister. Henn, MD  FLUOROSCOPY TIME:  3 min and 36 seconds  MEDICATIONS: 100 mcg fentanyl, 2 mg versed. A radiology nurse monitored the patient for moderate sedation.  ANESTHESIA/SEDATION: Moderate sedation time: 22 min  PROCEDURE: The procedures were explained to the patient. The risks and benefits of the procedure were discussed and the patient's questions were addressed. Informed  consent was obtained from the patient.  The patient  was placed prone on the interventional table. The skin was marked at the level of L4-L5. The back and right flank were prepped and draped in sterile fashion. Maximal barrier sterile technique was utilized including caps, mask, sterile gowns, sterile gloves, sterile drape, hand hygiene and skin antiseptic. 1% lidocaine was used for local anesthetic. A 20 gauge spinal needle was directed into the thecal sac along the right side of L4-L5. Clear CSF was identified. The opening pressure was roughly 20 cm. Approximately 10 mL of CSF was removed and sent for analysis. The stylet was placed back in the needle and the needle was removed without complication. Bandage was placed over the lumbar puncture site.  Attention was directed to the right kidney. Ultrasound demonstrated moderate right hydronephrosis. 1% lidocaine was used for local anesthetic in the right flank. A 21 gauge needle was directed into a dilated lower pole calyx with ultrasound guidance. Needle position was confirmed within the collecting system. A 0.018 wire was advanced to the collecting system and an Accustick dilator set was placed. Cloudy urine was aspirated from the catheter. A Bentson wire was advanced into the proximal ureter. The tract was dilated and a 10.2 [place] multipurpose drain was advanced over the wire. Tube was reconstituted in the renal pelvis. Additional cloudy fluid was removed. Urine was sent for culture. Catheter was sutured to the skin and attached to gravity bag. Fluoroscopic and ultrasound images were taken and saved for documentation.  FINDINGS: Lumbar puncture: 10 mL of clear CSF was removed. Opening pressure was approximately 20 cm.  Nephrostomy tube: Moderate right hydronephrosis. Cloudy urine was removed from the right kidney. Nephrostomy tube is reconstituted in the renal pelvis.  COMPLICATIONS: None  IMPRESSION: Successful placement of a right percutaneous nephrostomy tube with ultrasound and fluoroscopic guidance. Fluid was  sent for Gram stain and culture.  Successful fluoroscopic guided lumbar puncture.   Electronically Signed   By: Markus Daft M.D.   On: 03/01/2014 14:53   Ir Fluoro Guide Ndl Plmt / Bx  03/01/2014   CLINICAL DATA:  33 year old with HIV, lymphoma and right hydronephrosis due to a right ureter stone. 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  EXAM: FLUOROSCOPIC GUIDED LUMBAR PUNCTURE  ULTRASOUND AND FLUOROSCOPIC GUIDED PLACEMENT OF RIGHT NEPHROSTOMY TUBE.  Physician: Stephan Minister. Henn, MD  FLUOROSCOPY TIME:  3 min and 36 seconds  MEDICATIONS: 100 mcg fentanyl, 2 mg versed. A radiology nurse monitored the patient for moderate sedation.  ANESTHESIA/SEDATION: Moderate sedation time: 22 min  PROCEDURE: The procedures were explained to the patient. The risks and benefits of the procedure were discussed and the patient's questions were addressed. Informed consent was obtained from the patient.  The patient was placed prone on the interventional table. The skin was marked at the level of L4-L5. The back and right flank were prepped and draped in sterile fashion. Maximal barrier sterile technique was utilized including caps, mask, sterile gowns, sterile gloves, sterile drape, hand hygiene and skin antiseptic. 1% lidocaine was used for local anesthetic. A 20 gauge spinal needle was directed into the thecal sac along the right side of L4-L5. Clear CSF was identified. The opening pressure was roughly 20 cm. Approximately 10 mL of CSF was removed and sent for analysis. The stylet was placed back in the needle and the needle was removed without complication. Bandage was placed over the lumbar puncture site.  Attention was directed to the right kidney.  Ultrasound demonstrated moderate right hydronephrosis. 1% lidocaine was used for local anesthetic in the right flank. A 21 gauge needle was directed into a dilated lower pole calyx with ultrasound guidance. Needle position was confirmed within the collecting  system. A 0.018 wire was advanced to the collecting system and an Accustick dilator set was placed. Cloudy urine was aspirated from the catheter. A Bentson wire was advanced into the proximal ureter. The tract was dilated and a 10.2 [place] multipurpose drain was advanced over the wire. Tube was reconstituted in the renal pelvis. Additional cloudy fluid was removed. Urine was sent for culture. Catheter was sutured to the skin and attached to gravity bag. Fluoroscopic and ultrasound images were taken and saved for documentation.  FINDINGS: Lumbar puncture: 10 mL of clear CSF was removed. Opening pressure was approximately 20 cm.  Nephrostomy tube: Moderate right hydronephrosis. Cloudy urine was removed from the right kidney. Nephrostomy tube is reconstituted in the renal pelvis.  COMPLICATIONS: None  IMPRESSION: Successful placement of a right percutaneous nephrostomy tube with ultrasound and fluoroscopic guidance. Fluid was sent for Gram stain and culture.  Successful fluoroscopic guided lumbar puncture.   Electronically Signed   By: Markus Daft M.D.   On: 03/01/2014 14:53      Assessment/Plan:  Principal Problem:   Staphylococcus aureus bacteremia Active Problems:   HIV disease   Hodgkin's disease   Hypokalemia   Thrombocytopenia   Acute renal failure   Shingles rash   Metabolic acidosis, normal anion gap (NAG)   CLABSI (central line-associated bloodstream infection)   Numbness of left lower extremity   Hydronephrosis, right & hydroureter: secondary to obstructing ureteral calculi    Thomas [name] is a 33 y.o. 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 Cytology may reveal diagnosis though FRESH CSF is frequently more informative for making this diagnosis (see above discusison re ? Switch to NAFCILLIN  #3 ? GNR in blood cultures 1/2. I was called about this and discussed with Dr. Algis Liming yesterday. This culture report turns out to have bee incorrect and pt actually only grew diphtheroid from 1/2 culutures = contaminant   # 4 hydronephrosis with stone obstructing right ureter: Greatly appreciate Urology and IR help and pt is now sp nephrostomy tube placement.   --followup cultures urine ahs been sterile from 02/23/14   #5 HIV: continue Atripla        ta LOS: 6 days   Alcide Evener 03/01/2014, 9:07 PM

## 2014-03-02 ENCOUNTER — Inpatient Hospital Stay (HOSPITAL_COMMUNITY): Payer: Medicare Other

## 2014-03-02 ENCOUNTER — Encounter (HOSPITAL_COMMUNITY): Payer: Self-pay | Admitting: Internal Medicine

## 2014-03-02 DIAGNOSIS — N200 Calculus of kidney: Secondary | ICD-10-CM

## 2014-03-02 DIAGNOSIS — N209 Urinary calculus, unspecified: Secondary | ICD-10-CM

## 2014-03-02 LAB — BASIC METABOLIC PANEL
ANION GAP: 12 (ref 5–15)
BUN: 15 mg/dL (ref 6–23)
CO2: 21 mEq/L (ref 19–32)
Calcium: 8.3 mg/dL — ABNORMAL LOW (ref 8.4–10.5)
Chloride: 105 mEq/L (ref 96–112)
Creatinine, Ser: 1.49 mg/dL — ABNORMAL HIGH (ref 0.50–1.35)
GFR calc non Af Amer: 61 mL/min — ABNORMAL LOW (ref >90)
GFR, EST AFRICAN AMERICAN: 71 mL/min — AB (ref >90)
Glucose, Bld: 86 mg/dL (ref 70–99)
Potassium: 3.6 mEq/L — ABNORMAL LOW (ref 3.7–5.3)
SODIUM: 138 meq/L (ref 137–147)

## 2014-03-02 LAB — CBC
HCT: 24.1 % — ABNORMAL LOW (ref 39.0–52.0)
Hemoglobin: 8 g/dL — ABNORMAL LOW (ref 13.0–17.0)
MCH: 33.2 pg (ref 26.0–34.0)
MCHC: 33.2 g/dL (ref 30.0–36.0)
MCV: 100 fL (ref 78.0–100.0)
PLATELETS: 252 10*3/uL (ref 150–400)
RBC: 2.41 MIL/uL — AB (ref 4.22–5.81)
RDW: 15.9 % — ABNORMAL HIGH (ref 11.5–15.5)
WBC: 6.5 10*3/uL (ref 4.0–10.5)

## 2014-03-02 LAB — CRYPTOCOCCAL ANTIGEN, CSF: Crypto Ag: NEGATIVE

## 2014-03-02 MED ORDER — GADOBENATE DIMEGLUMINE 529 MG/ML IV SOLN
15.0000 mL | Freq: Once | INTRAVENOUS | Status: AC | PRN
  Administered 2014-03-02: 13 mL via INTRAVENOUS

## 2014-03-02 NOTE — Progress Notes (Signed)
Subjective: Patient denies any right flank pain or fevers.   Objective: Physical Exam: BP 93/56  Pulse 96  Temp(Src) 98.6 F (37 C) (Oral)  Resp 16  Ht 6' 0.83" (1.85 m)  Wt 132 lb 11.2 oz (60.192 kg)  BMI 17.59 kg/m2  SpO2 98%  General: A&Ox3, NAD Abd: Soft, NT, right PCN intact, NT, no signs of hematoma, like pink output, 24 hr recorded 250 cc, 100 cc in bag now  Labs: CBC  Recent Labs  03/02/14 0430  WBC 6.5  HGB 8.0*  HCT 24.1*  PLT 252   BMET  Recent Labs  03/02/14 0430  NA 138  K 3.6*  CL 105  CO2 21  GLUCOSE 86  BUN 15  CREATININE 1.49*  CALCIUM 8.3*   LFT No results found for this basename: PROT, ALBUMIN, AST, ALT, ALKPHOS, BILITOT, BILIDIR, IBILI, LIPASE,  in the last 72 hours PT/INR  Recent Labs  03/01/14 1143  LABPROT 14.8  INR 1.16     Studies/Results: Ct Abdomen Pelvis Wo Contrast  03/01/2014   CLINICAL DATA:  Right hydronephrosis and bilateral nephrolithiasis by ultrasound. Renal failure. History of lymphoma.  EXAM: CT ABDOMEN AND PELVIS WITHOUT CONTRAST  TECHNIQUE: Multidetector CT imaging of the abdomen and pelvis was performed following the standard protocol without IV contrast.  COMPARISON:  Renal ultrasound on 02/23/2014 and PET scan on 12/10/2013.  FINDINGS: There is evidence significant right-sided hydronephrosis and hydroureter as visualized on the recent renal ultrasound study. Wanchese ureter is followed into the pelvis, there is a column of 3 adjacent significant sized calculi located roughly at the level of the mid sacrum. All 3 of the calculi measure approximately 10 mm in greatest diameter.  There are also a number of intrarenal calculi bilaterally. On the right side a 7 mm calculus is located in the lower pole collecting system. In the left kidney, the largest calculus measures 8 mm in the upper pole. There are at least 8 other smaller calculi dispersed throughout the left collecting system measuring on the order of roughly 2-3 mm each in  size. No left-sided hydronephrosis or ureteral calculi on the left.  Unenhanced appearance of the liver, spleen, pancreas, adrenal glands and bowel are unremarkable. Layering higher density in the gallbladder lumen likely relates to calculi. No evidence of gallbladder distention, inflammation or biliary dilatation.  The bladder is unremarkable. No free fluid or abscess is identified. There are some residual mildly prominent retroperitoneal lymph nodes identified which are similar in size compared to the prior PET scan and most prominent in the left periaortic region. Largest measures approximately 11 mm in short axis. No bony abnormalities are seen.  IMPRESSION: 1. Significant right-sided hydronephrosis and hydroureter secondary to a column of three roughly 10 mm diameter calculi stacked on top of one another in the distal ureter at the level of the mid sacrum. 2. Bilateral intrarenal calculi with single calculus in the lower pole of the right kidney and multiple calculi in the left kidney. The left kidney demonstrates no evidence of hydronephrosis. 3. Some residual mildly prominent left para-aortic retroperitoneal lymph nodes similar in size to the PET scan in May.   Electronically Signed   By: Aletta Edouard M.D.   On: 03/01/2014 08:55   Ct Head Wo Contrast  03/01/2014   CLINICAL DATA:  33 year old male with left lower extremity weakness. 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 Initial encounter.  EXAM: CT HEAD WITHOUT CONTRAST  TECHNIQUE: Contiguous axial images were obtained from  the base of the skull through the vertex without intravenous contrast.  COMPARISON:  Head CT without contrast 08/02/2013. Thoracic spine MRI 02/28/2014.  FINDINGS: Visualized paranasal sinuses and mastoids are clear. No osseous abnormality identified. Visualized orbits and scalp soft tissues are within normal limits.  Stable cerebral volume. No midline shift, mass effect, or evidence of intracranial mass lesion. Stable, normal  ventricular size and configuration. Stable, normal basilar cisterns. No acute intracranial hemorrhage identified. Gray-white matter differentiation is within normal limits throughout the brain. No evidence of cortically based acute infarction identified. No suspicious intracranial vascular hyperdensity.  IMPRESSION: Stable and normal noncontrast CT appearance of the brain. No contraindication to lumbar puncture identified.   Electronically Signed   By: Lars Pinks M.D.   On: 03/01/2014 11:39   Mr Thoracic Spine W Wo Contrast  02/28/2014   CLINICAL DATA:  33 year old male with history of HIV, lymphoma, bacteremia. Left lower extremity numbness.  EXAM: MRI THORACIC SPINE WITHOUT AND WITH CONTRAST  MRI LUMBOSACRAL PLEXUS WITHOUT AND WITH CONTRAST.  TECHNIQUE: Multiplanar and multiecho pulse sequences of the thoracic spine and lumbosacral plexus were obtained without and with intravenous contrast.  CONTRAST:  33mL MULTIHANCE GADOBENATE DIMEGLUMINE 529 MG/ML IV SOLN  COMPARISON:  PET-CT 11/2013 and earlier. CT Abdomen and Pelvis 08/04/2013  FINDINGS: MR THORACIC SPINE FINDINGS  Limited sagittal imaging of the cervical spine appears negative except for diffusely abnormal bone marrow signal, including at the visualized skullbase.  Preserved thoracic vertebral height and alignment, but diffusely abnormal bone marrow signal, heterogeneously decreased on T1 and T2 imaging. Marrow signal is heterogeneously increased on STIR, and following contrast there is widespread heterogeneous bone marrow enhancement.  Axial post-contrast images are degraded by motion. Sagittal post-contrast images indicate multifocal abnormal enhancement along the dorsal spinal cord or dorsal surface of the spinal cord. The levels T4 through T7 are most affected in the upper spine. At the T10-T11 level there is a larger area of abnormal enhancement which appears to extend farther into the substance of the cord (series 23 image 6), in this corresponds to  abnormal T2 signal within the dorsal cord (series 7, image 32).  There is also subtle abnormal dorsal column increased T2 signal in the upper cord corresponding to the areas of enhancement, T4 -T6.  No thoracic spinal cord expansion. Capacious thoracic thecal sac. No spinal stenosis. Conus medullaris is not included on these images.  No epidural process in the thoracic spine.  Trace layering right pleural effusion. Mildly increased signal in the deep tendon right lower lobe. Visualized upper abdominal viscera remarkable for decreased T2 signal in the liver and spleen.  MR LUMBOSACRAL PLEXUS FINDINGS  Study is intermittently degraded by motion artifact despite repeated imaging attempts.  The entire lumbar spine is not included. Diffusely abnormal bone marrow signal. These images suggest transitional lumbosacral anatomy, uncertain when compared to the comparison exams.  Continued right hydronephrosis and right hydroureter related to an obstructing calculus at the level of the right sacrum, better demonstrated on 11/2013.  The conus medullaris is not included. Cauda equina nerve roots appear within normal limits on precontrast images. Suggestion of abnormal enhancement of the left S1 or S2 nerve roots at the L5-S1 level (series 20, image 16 and series 21, image 40. No other abnormal cauda equina enhancement identified.  Capacious lumbar spinal canal.  No spinal stenosis.  The exiting nerves of the lumbosacral plexus appear within normal limits. Mild vascular artifact related to the common iliac arteries and iliac bifurcations.  IMPRESSION: 1. 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 But there is a larger enhancing plaque at the T10-T11 level dorsally which appears to involve the substance of the cord. 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 Follow-up CSF analysis may confirm, but recommend Head CT prior to lumbar  puncture to exclude intracranial mass effect. 2. Lumbosacral plexus was imaged and appears within normal limits. However, there is suggestion of abnormal enhancement of the left S1 or S2 nerve roots in the cauda equina at the L5-S1 level. 3. 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 Bone marrow treatment effect or hemosiderosis are less likely. 4. Chronic right side obstructive uropathy; moderate to severe right hydronephrosis and hydroureter due to an obstructing calculus at the level of the right sacrum.   Electronically Signed   By: Lars Pinks M.D.   On: 02/28/2014 21:59   Mr Lumbar Spine W Wo Contrast  02/28/2014   CLINICAL DATA:  33 year old male with history of HIV, lymphoma, bacteremia. Left lower extremity numbness.  EXAM: MRI THORACIC SPINE WITHOUT AND WITH CONTRAST  MRI LUMBOSACRAL PLEXUS WITHOUT AND WITH CONTRAST.  TECHNIQUE: Multiplanar and multiecho pulse sequences of the thoracic spine and lumbosacral plexus were obtained without and with intravenous contrast.  CONTRAST:  34mL MULTIHANCE GADOBENATE DIMEGLUMINE 529 MG/ML IV SOLN  COMPARISON:  PET-CT 11/2013 and earlier. CT Abdomen and Pelvis 08/04/2013  FINDINGS: MR THORACIC SPINE FINDINGS  Limited sagittal imaging of the cervical spine appears negative except for diffusely abnormal bone marrow signal, including at the visualized skullbase.  Preserved thoracic vertebral height and alignment, but diffusely abnormal bone marrow signal, heterogeneously decreased on T1 and T2 imaging. Marrow signal is heterogeneously increased on STIR, and following contrast there is widespread heterogeneous bone marrow enhancement.  Axial post-contrast images are degraded by motion. Sagittal post-contrast images indicate multifocal abnormal enhancement along the dorsal spinal cord or dorsal surface of the spinal cord. The levels T4 through T7 are most affected in the upper spine. At the T10-T11 level there is a  larger area of abnormal enhancement which appears to extend farther into the substance of the cord (series 23 image 6), in this corresponds to abnormal T2 signal within the dorsal cord (series 7, image 32).  There is also subtle abnormal dorsal column increased T2 signal in the upper cord corresponding to the areas of enhancement, T4 -T6.  No thoracic spinal cord expansion. Capacious thoracic thecal sac. No spinal stenosis. Conus medullaris is not included on these images.  No epidural process in the thoracic spine.  Trace layering right pleural effusion. Mildly increased signal in the deep tendon right lower lobe. Visualized upper abdominal viscera remarkable for decreased T2 signal in the liver and spleen.  MR LUMBOSACRAL PLEXUS FINDINGS  Study is intermittently degraded by motion artifact despite repeated imaging attempts.  The entire lumbar spine is not included. Diffusely abnormal bone marrow signal. These images suggest transitional lumbosacral anatomy, uncertain when compared to the comparison exams.  Continued right hydronephrosis and right hydroureter related to an obstructing calculus at the level of the right sacrum, better demonstrated on 11/2013.  The conus medullaris is not included. Cauda equina nerve roots appear within normal limits on precontrast images. Suggestion of abnormal enhancement of the left S1 or S2 nerve roots at the L5-S1 level (series 20, image 16 and series 21, image 40. No other abnormal cauda equina enhancement identified.  Capacious lumbar spinal canal.  No spinal stenosis.  The exiting nerves of the lumbosacral plexus appear within normal limits. Mild vascular artifact related to the common iliac arteries and iliac bifurcations.  IMPRESSION: 1. 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 But there is a larger enhancing plaque at the T10-T11 level dorsally which appears to involve the substance of the cord. These are suspicious for  leptomeningeal and spinal cord metastases given the history of lymphoma. Follow-up CSF analysis may confirm, but recommend Head CT prior to lumbar puncture to exclude intracranial mass effect. 2. Lumbosacral plexus was imaged and appears within normal limits. However, there is suggestion of abnormal enhancement of the left S1 or S2 nerve roots in the cauda equina at the L5-S1 level. 3. 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 Bone marrow treatment effect or hemosiderosis are less likely. 4. Chronic right side obstructive uropathy; moderate to severe right hydronephrosis and hydroureter due to an obstructing calculus at the level of the right sacrum.   Electronically Signed   By: Lars Pinks M.D.   On: 02/28/2014 21:59   Ir Perc Nephrostomy Right  03/01/2014   CLINICAL DATA:  33 year old with HIV, lymphoma and right hydronephrosis due to a right ureter stone. 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  EXAM: FLUOROSCOPIC GUIDED LUMBAR PUNCTURE  ULTRASOUND AND FLUOROSCOPIC GUIDED PLACEMENT OF RIGHT NEPHROSTOMY TUBE.  Physician: Stephan Minister. Henn, MD  FLUOROSCOPY TIME:  3 min and 36 seconds  MEDICATIONS: 100 mcg fentanyl, 2 mg versed. A radiology nurse monitored the patient for moderate sedation.  ANESTHESIA/SEDATION: Moderate sedation time: 22 min  PROCEDURE: The procedures were explained to the patient. The risks and benefits of the procedure were discussed and the patient's questions were addressed. Informed consent was obtained from the patient.  The patient was placed prone on the interventional table. The skin was marked at the level of L4-L5. The back and right flank were prepped and draped in sterile fashion. Maximal barrier sterile technique was utilized including caps, mask, sterile gowns, sterile gloves, sterile drape, hand hygiene and skin antiseptic. 1% lidocaine was used for local anesthetic. A 20 gauge spinal needle was  directed into the thecal sac along the right side of L4-L5. Clear CSF was identified. The opening pressure was roughly 20 cm. Approximately 10 mL of CSF was removed and sent for analysis. The stylet was placed back in the needle and the needle was removed without complication. Bandage was placed over the lumbar puncture site.  Attention was directed to the right kidney. Ultrasound demonstrated moderate right hydronephrosis. 1% lidocaine was used for local anesthetic in the right flank. A 21 gauge needle was directed into a dilated lower pole calyx with ultrasound guidance. Needle position was confirmed within the collecting system. A 0.018 wire was advanced to the collecting system and an Accustick dilator set was placed. Cloudy urine was aspirated from the catheter. A Bentson wire was advanced into the proximal ureter. The tract was dilated and a 10.2 [place] multipurpose drain was advanced over the wire. Tube was reconstituted in the renal pelvis. Additional cloudy fluid was removed. Urine was sent for culture. Catheter was sutured to the skin and attached to gravity bag. Fluoroscopic and ultrasound images were taken and saved for documentation.  FINDINGS: Lumbar puncture: 10 mL of clear CSF was removed. Opening pressure was approximately 20 cm.  Nephrostomy tube: Moderate right hydronephrosis. Cloudy urine was  removed from the right kidney. Nephrostomy tube is reconstituted in the renal pelvis.  COMPLICATIONS: None  IMPRESSION: Successful placement of a right percutaneous nephrostomy tube with ultrasound and fluoroscopic guidance. Fluid was sent for Gram stain and culture.  Successful fluoroscopic guided lumbar puncture.   Electronically Signed   By: Markus Daft M.D.   On: 03/01/2014 14:53   Ir US Guide Bx Asp/drain  03/01/2014   CLINICAL DATA:  33 year old with HIV, lymphoma and right hydronephrosis due to a right ureter stone. 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  EXAM:  FLUOROSCOPIC GUIDED LUMBAR PUNCTURE  ULTRASOUND AND FLUOROSCOPIC GUIDED PLACEMENT OF RIGHT NEPHROSTOMY TUBE.  Physician: Stephan Minister. Henn, MD  FLUOROSCOPY TIME:  3 min and 36 seconds  MEDICATIONS: 100 mcg fentanyl, 2 mg versed. A radiology nurse monitored the patient for moderate sedation.  ANESTHESIA/SEDATION: Moderate sedation time: 22 min  PROCEDURE: The procedures were explained to the patient. The risks and benefits of the procedure were discussed and the patient's questions were addressed. Informed consent was obtained from the patient.  The patient was placed prone on the interventional table. The skin was marked at the level of L4-L5. The back and right flank were prepped and draped in sterile fashion. Maximal barrier sterile technique was utilized including caps, mask, sterile gowns, sterile gloves, sterile drape, hand hygiene and skin antiseptic. 1% lidocaine was used for local anesthetic. A 20 gauge spinal needle was directed into the thecal sac along the right side of L4-L5. Clear CSF was identified. The opening pressure was roughly 20 cm. Approximately 10 mL of CSF was removed and sent for analysis. The stylet was placed back in the needle and the needle was removed without complication. Bandage was placed over the lumbar puncture site.  Attention was directed to the right kidney. Ultrasound demonstrated moderate right hydronephrosis. 1% lidocaine was used for local anesthetic in the right flank. A 21 gauge needle was directed into a dilated lower pole calyx with ultrasound guidance. Needle position was confirmed within the collecting system. A 0.018 wire was advanced to the collecting system and an Accustick dilator set was placed. Cloudy urine was aspirated from the catheter. A Bentson wire was advanced into the proximal ureter. The tract was dilated and a 10.2 [place] multipurpose drain was advanced over the wire. Tube was reconstituted in the renal pelvis. Additional cloudy fluid was removed. Urine was  sent for culture. Catheter was sutured to the skin and attached to gravity bag. Fluoroscopic and ultrasound images were taken and saved for documentation.  FINDINGS: Lumbar puncture: 10 mL of clear CSF was removed. Opening pressure was approximately 20 cm.  Nephrostomy tube: Moderate right hydronephrosis. Cloudy urine was removed from the right kidney. Nephrostomy tube is reconstituted in the renal pelvis.  COMPLICATIONS: None  IMPRESSION: Successful placement of a right percutaneous nephrostomy tube with ultrasound and fluoroscopic guidance. Fluid was sent for Gram stain and culture.  Successful fluoroscopic guided lumbar puncture.   Electronically Signed   By: Markus Daft M.D.   On: 03/01/2014 14:53   Ir Fluoro Guide Ndl Plmt / Bx  03/01/2014   CLINICAL DATA:  33 year old with HIV, lymphoma and right hydronephrosis due to a right ureter stone. 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  EXAM: FLUOROSCOPIC GUIDED LUMBAR PUNCTURE  ULTRASOUND AND FLUOROSCOPIC GUIDED PLACEMENT OF RIGHT NEPHROSTOMY TUBE.  Physician: Stephan Minister. Henn, MD  FLUOROSCOPY TIME:  3 min and 36 seconds  MEDICATIONS: 100 mcg fentanyl,  2 mg versed. A radiology nurse monitored the patient for moderate sedation.  ANESTHESIA/SEDATION: Moderate sedation time: 22 min  PROCEDURE: The procedures were explained to the patient. The risks and benefits of the procedure were discussed and the patient's questions were addressed. Informed consent was obtained from the patient.  The patient was placed prone on the interventional table. The skin was marked at the level of L4-L5. The back and right flank were prepped and draped in sterile fashion. Maximal barrier sterile technique was utilized including caps, mask, sterile gowns, sterile gloves, sterile drape, hand hygiene and skin antiseptic. 1% lidocaine was used for local anesthetic. A 20 gauge spinal needle was directed into the thecal sac along the right side of L4-L5. Clear CSF was  identified. The opening pressure was roughly 20 cm. Approximately 10 mL of CSF was removed and sent for analysis. The stylet was placed back in the needle and the needle was removed without complication. Bandage was placed over the lumbar puncture site.  Attention was directed to the right kidney. Ultrasound demonstrated moderate right hydronephrosis. 1% lidocaine was used for local anesthetic in the right flank. A 21 gauge needle was directed into a dilated lower pole calyx with ultrasound guidance. Needle position was confirmed within the collecting system. A 0.018 wire was advanced to the collecting system and an Accustick dilator set was placed. Cloudy urine was aspirated from the catheter. A Bentson wire was advanced into the proximal ureter. The tract was dilated and a 10.2 [place] multipurpose drain was advanced over the wire. Tube was reconstituted in the renal pelvis. Additional cloudy fluid was removed. Urine was sent for culture. Catheter was sutured to the skin and attached to gravity bag. Fluoroscopic and ultrasound images were taken and saved for documentation.  FINDINGS: Lumbar puncture: 10 mL of clear CSF was removed. Opening pressure was approximately 20 cm.  Nephrostomy tube: Moderate right hydronephrosis. Cloudy urine was removed from the right kidney. Nephrostomy tube is reconstituted in the renal pelvis.  COMPLICATIONS: None  IMPRESSION: Successful placement of a right percutaneous nephrostomy tube with ultrasound and fluoroscopic guidance. Fluid was sent for Gram stain and culture.  Successful fluoroscopic guided lumbar puncture.   Electronically Signed   By: Markus Daft M.D.   On: 03/01/2014 14:53    Assessment/Plan: Right hydronephrosis with right ureter stone S/p right PCN 8/9, good output with slightly improved renal function, Cx no growth/pending Plans per Urology     LOS: 7 days    Hedy Jacob PA-C 03/02/2014 12:22 PM

## 2014-03-02 NOTE — Progress Notes (Signed)
[facility] for Infectious Disease    Date of Admission:  02/23/2014    Total days of antibiotics 7        Day 6 Ancef        Day 3 Vancomycin (discontinued 02/26/14)        Day 1 Rocephin (discontinued 03/01/14) Principal Problem:   Staphylococcus aureus bacteremia Active Problems:   HIV disease   Hodgkin's disease   Hypokalemia   Thrombocytopenia   Acute renal failure   Shingles rash   Metabolic acidosis, normal anion gap (NAG)   CLABSI (central line-associated bloodstream infection)   Numbness of left lower extremity   Hydronephrosis, right & hydroureter: secondary to obstructing ureteral calculi   . calcium carbonate  800 mg of elemental calcium Oral TID  .  ceFAZolin (ANCEF) IV  2 g Intravenous 3 times per day  . clotrimazole-betamethasone   Topical BID  . feeding supplement (ENSURE COMPLETE)  237 mL Oral TID BM  . ferrous sulfate  325 mg Oral BID WC    Subjective: No significant complaints today. Denies any fever or chills, no significant pain, difficulty w/ urination, nausea, or vomiting.    Past Medical History  Diagnosis Date  . HIV disease 06/25/2012  . Skin lesion 07/03/2012  . Leg pain 07/03/2012  . Sinus tachycardia 08/06/2012  . Penile cyst 08/06/2012  . Cancer     History  Substance Use Topics  . Smoking status: Former Smoker -- 1.00 packs/day for 15 years    Types: Cigarettes    Start date: 08/24/2013  . Smokeless tobacco: Never Used     Comment: quitline info, counseling  . Alcohol Use: No    History reviewed. No pertinent family history.  Allergies  Allergen Reactions  . Tylenol [Acetaminophen]     sweats    Objective: Temp:  [97.9 F (36.6 C)-98.7 F (37.1 C)] 98.6 F (37 C) (08/10 0359) Pulse Rate:  [70-96] 96 (08/10 0359) Resp:  [15-22] 16 (08/10 0359) BP: (67-100)/(40-65) 93/56 mmHg (08/10 0359) SpO2:  [97 %-100 %] 98 % (08/10 0359) Weight:  [132 lb 11.2 oz (60.192 kg)] 132 lb 11.2 oz (60.192 kg) (08/09 2009)  General:  Pleasant 33 y/o male, alert, cooperative, no acute distress.  HEENT: PERRL, EOMI. Moist mucus membranes Neck: Full range of motion without pain, supple, no lymphadenopathy or carotid bruits Lungs: Clear to ascultation bilaterally, no wheezes, rales, rhonchi Heart: Regular rate and rhythm, no murmurs.  Chest: Right upper chest s/p port-a-cath removal. Gauze appears saturated w/ sero-sanguinous drainage. Tender to palpation. Abdomen: Soft, non-tender, non-distended, bowel sounds present.  Extremities: No cyanosis, clubbing, or edema. Excoriated healing rash over left posterior thigh, groin and buttock. Neurologic: Alert & oriented X3, cranial nerves II-XII intact, strength grossly intact  Lab Results Lab Results  Component Value Date   WBC 6.5 03/02/2014   HGB 8.0* 03/02/2014   HCT 24.1* 03/02/2014   MCV 100.0 03/02/2014   PLT 252 03/02/2014    Lab Results  Component Value Date   CREATININE 1.49* 03/02/2014   BUN 15 03/02/2014   NA 138 03/02/2014   K 3.6* 03/02/2014   CL 105 03/02/2014   CO2 21 03/02/2014    Lab Results  Component Value Date   ALT 25 02/23/2014   AST 18 02/23/2014   ALKPHOS 183* 02/23/2014   BILITOT 0.3 02/23/2014      Microbiology: Recent Results (from the past 240 hour(s))  CULTURE, BLOOD (ROUTINE X 2)  Status: None   Collection Time    02/23/14  6:08 PM      Result Value Ref Range Status   Specimen Description BLOOD RIGHT ARM   Final   Special Requests BOTTLES DRAWN AEROBIC AND ANAEROBIC 10CC   Final   Culture  Setup Time     Final   Value: 02/23/2014 22:16     Performed at [facility]   Culture     Final   Value: STAPHYLOCOCCUS AUREUS     Note: RIFAMPIN AND GENTAMICIN SHOULD NOT BE USED AS SINGLE DRUGS FOR TREATMENT OF STAPH INFECTIONS. This organism is presumed to be Clindamycin resistant based on detection of inducible Clindamycin resistance.     Note: Gram Stain Report Called to,Read Back By and Verified With: ANDY BRAKE@3 :00PM ON 02/24/14 BY DANTS      Performed at [facility]   Report Status 02/26/2014 FINAL   Final   Organism ID, Bacteria STAPHYLOCOCCUS AUREUS   Final  CULTURE, BLOOD (ROUTINE X 2)     Status: None   Collection Time    02/23/14  6:30 PM      Result Value Ref Range Status   Specimen Description BLOOD LEFT ARM   Final   Special Requests BOTTLES DRAWN AEROBIC AND ANAEROBIC 10CC   Final   Culture  Setup Time     Final   Value: 02/23/2014 22:16     Performed at [facility]   Culture     Final   Value: STAPHYLOCOCCUS AUREUS     Note: SUSCEPTIBILITIES PERFORMED ON PREVIOUS CULTURE WITHIN THE LAST 5 DAYS.     Note: Gram Stain Report Called to,Read Back By and Verified With: TY HICKS ON 02/24/2014 AT 10:44P BY WILEJ     Performed at [facility]   Report Status 02/26/2014 FINAL   Final  URINE CULTURE     Status: None   Collection Time    02/23/14  9:46 PM      Result Value Ref Range Status   Specimen Description URINE, CLEAN CATCH   Final   Special Requests NONE   Final   Culture  Setup Time     Final   Value: 02/24/2014 04:09     Performed at [facility]     Final   Value: NO GROWTH     Performed at [facility]   Culture     Final   Value: NO GROWTH     Performed at [facility]   Report Status 02/25/2014 FINAL   Final  GC/CHLAMYDIA PROBE AMP     Status: None   Collection Time    02/23/14  9:46 PM      Result Value Ref Range Status   CT Probe RNA NEGATIVE  NEGATIVE Final   GC Probe RNA NEGATIVE  NEGATIVE Final   Comment: (NOTE)                                                                                               **Normal Reference Range: Negative**  Assay performed using the Gen-Probe APTIMA COMBO2 (R) Assay.     Acceptable specimen types for this assay include APTIMA Swabs (Unisex,     endocervical, urethral, or vaginal), first void urine, and ThinPrep     liquid based cytology samples.     Performed at [facility], BLOOD (ROUTINE X 2)     Status: None   Collection Time    02/25/14 12:20 PM      Result Value Ref Range Status   Specimen Description BLOOD LEFT ARM   Final   Special Requests BOTTLES DRAWN AEROBIC AND ANAEROBIC 10CC   Final   Culture  Setup Time     Final   Value: 02/25/2014 17:15     Performed at [facility]   Culture     Final   Value:        BLOOD CULTURE RECEIVED NO GROWTH TO DATE CULTURE WILL BE HELD FOR 5 DAYS BEFORE ISSUING A FINAL NEGATIVE REPORT     Performed at [facility]   Report Status PENDING   Incomplete  CULTURE, BLOOD (ROUTINE X 2)     Status: None   Collection Time    02/25/14 12:40 PM      Result Value Ref Range Status   Specimen Description BLOOD LEFT ARM   Final   Special Requests BOTTLES DRAWN AEROBIC ONLY 10CC   Final   Culture  Setup Time     Final   Value: 02/25/2014 17:15     Performed at [facility]   Culture     Final   Value: DIPHTHEROIDS(CORYNEBACTERIUM SPECIES)     Note: Standardized susceptibility testing for this organism is not available.     Note: Gram Stain Report Called to,Read Back By and Verified With:  BECKA QIU;0143;02/28/14;SMIAS PREVIOUSLY REPORTED AS GRAM NEGATIVE RODS CORRECTED RESULTS CALLED TO: LAURA JAMA 03/01/14 @ 11:32AM BY RUSCOE A.     Performed at [facility]   Report Status 03/01/2014 FINAL   Final  CULTURE, BLOOD (ROUTINE X 2)     Status: None   Collection Time    02/28/14  8:30 PM      Result Value Ref Range Status   Specimen Description BLOOD RIGHT HAND   Final   Special Requests BOTTLES DRAWN AEROBIC ONLY 10CC   Final   Culture  Setup Time     Final   Value: 03/01/2014 03:12     Performed at [facility]   Culture     Final   Value:        BLOOD CULTURE RECEIVED NO GROWTH TO DATE CULTURE WILL BE HELD FOR 5 DAYS BEFORE ISSUING A FINAL NEGATIVE REPORT     Performed at [facility]   Report Status PENDING   Incomplete  CULTURE, BLOOD (ROUTINE X 2)     Status:  None   Collection Time    02/28/14  8:31 PM      Result Value Ref Range Status   Specimen Description BLOOD LEFT ANTECUBITAL   Final   Special Requests BOTTLES DRAWN AEROBIC AND ANAEROBIC 10CC EA   Final   Culture  Setup Time     Final   Value: 03/01/2014 03:12     Performed at [facility]   Culture     Final   Value:        BLOOD CULTURE RECEIVED NO GROWTH TO DATE CULTURE WILL BE HELD FOR 5 DAYS  BEFORE ISSUING A FINAL NEGATIVE REPORT     Performed at [facility]   Report Status PENDING   Incomplete  CSF CULTURE     Status: None   Collection Time    03/01/14  2:25 PM      Result Value Ref Range Status   Specimen Description CSF   Final   Special Requests NONE   Final   Gram Stain     Final   Value: CYTOSPIN WBC PRESENT,BOTH PMN AND MONONUCLEAR     NO ORGANISMS SEEN     Performed at [facility]     Performed at [facility]   Culture PENDING   Incomplete   Report Status PENDING   Incomplete  GRAM STAIN     Status: None   Collection Time    03/01/14  2:25 PM      Result Value Ref Range Status   Specimen Description CSF   Final   Special Requests NONE   Final   Gram Stain     Final   Value: CYTOSPIN SLIDE     WBC PRESENT,BOTH PMN AND MONONUCLEAR     NO ORGANISMS SEEN   Report Status 03/01/2014 FINAL   Final  BODY FLUID CULTURE     Status: None   Collection Time    03/01/14  2:31 PM      Result Value Ref Range Status   Specimen Description FLUID RIGHT KIDNEY   Final   Special Requests URINE FROM RIGHT NEPHROSTOMY TUBE   Final   Gram Stain     Final   Value: ABUNDANT WBC PRESENT,BOTH PMN AND MONONUCLEAR     NO SQUAMOUS EPITHELIAL CELLS SEEN     NO ORGANISMS SEEN     Performed at [facility]   Culture PENDING   Incomplete   Report Status PENDING   Incomplete    TEE:  IMPRESSION:  1. Thickened port-a-cath tip in the SVC concerning for thrombus and/or vegetation 2. No evidence for valvular endocarditis. 3. No LAA  thrombus 4. LVEF 55-60% with normal wall motion  Assessment: 33 y/o M w/ PMHx of HIV and Hodgkin's Lymphoma receiving chemotherapy, admitted for AKI and hypotension, found to have healing shingles rash on posterior left thigh and MSSA bacteremia. Patient had been receiving chemo through his right upper chest port-a-cath, however, there was some concern that this was the source of bacteremia. On 02/27/14, the port-a-cath was removed after TEE was suggestive of thickening of the catheter tip. Over the weekend, an MRI of the lumbar and thoracic spine was performed to assess the patient's left-sided weakness and numbness that he had been describing for some time. This study was suggestive of thoracic leptomeningeal enhancement (T4-T7), and possible distal enhancement involving the spinal cord (T10-T11) thought to be 2/2 metastases in the setting of lymphoma. Also showed diffuse abnormal bone marrow signal suspicious for osseus metastatic involvement as well. Lastly, the study showed severe right-sided hydronephrosis and hydroureter 2/2 a calculus at the level of the sacrum. On 03/01/14, patient underwent right-sided nephrostomy in addition to an LP. CSF fluid culture shows no growth to date. Fluid was only significant for 8 WBC's, 0 RBC's, few lymphocytes, and PMN's too few to count (tube 1), but w/ glucose of 40 (low), protein of 84 (high). This is very likely reflective of a metastatic/malignant process rather than a bacterial etiology.  Plan: 1. Continue Sherlyn Lees, MD PGY-2, Internal Medicine Pager: [phone] 03/02/2014, 9:28 AM  Attending addendum: I reviewed the weekend's events, examined Mr. Silva and discussed his case with Dr. Ronnald Ramp. He continues on therapy for MSSA bacteremia. His Port-A-Cath was removed 3 days ago. Because of his left leg symptoms he underwent brain and spine scans. 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 Lumbar puncture showed 8 white  blood cells with elevated CSF protein and low glucose. I do not think that he has staph meningitis but agree that he may have mild central nervous system infection due to recent zoster or carcinomatous meningitis. I will continue cefazolin for now. He also now has a right percutaneous nephrostomy after being found to have an obstructing right ureteral stone. His renal function is improving slowly. I will continue observation off of antiretroviral therapy until his renal function stabilizes.  Michel Bickers, MD [facility] [phone] pager   [phone] cell 03/02/2014, 3:03 PM

## 2014-03-02 NOTE — Progress Notes (Signed)
Physical Therapy Evaluation Patient Details Name: Thomas [name] MRN: [id number] DOB: 04-10-1981 Today's Date: 03/02/2014   History of Present Illness  Pt admitted with BLE numbness, worse on the L, bacteremia, Hodgkin's Disease, shingles rash, and acute renal failure.  Clinical Impression  Pt admitted with above. Presents with general weakness and unsteadiness, affecting his functional mobility. Pt will benefit from PT to improve his strength and balance to facilitate d/c home with HHPT and [facility].  Pt ambulated with no assistive device. He compensated for unsteadiness due to his leg numbness by reaching for the sink and leaning on his IV pole while walking. This leads SPT to recommend a cane for added stability with walking. Pt seemed agreeable.       Follow Up Recommendations Home health PT;Other (comment) (pt requests [facility] for bathing, etc.)    Equipment Recommendations  Cane    Recommendations for Other Services       Precautions / Restrictions Precautions Precautions: None Restrictions Weight Bearing Restrictions: No      Mobility  Bed Mobility Overal bed mobility: Independent                Transfers Overall transfer level: Independent Equipment used: None                Ambulation/Gait Ambulation/Gait assistance: Min guard Ambulation Distance (Feet): 250 Feet Assistive device: None Gait Pattern/deviations: Step-through pattern;Decreased stride length     General Gait Details: Pt was a bit unsteady and tended towards the R with ambulation. Pt used sink and IV pole for additional stability. Decreased gait speed.   Stairs            Wheelchair Mobility    Modified Rankin (Stroke Patients Only)       Balance Overall balance assessment: Modified Independent (Used sink and IV pole for some stability)                                           Pertinent Vitals/Pain Pain Assessment: No/denies pain BP at rest, prior  to session: 87/54 BP sitting EOB: 101/62 BP sitting after walking 250 ft: 94/64    Home Living Family/patient expects to be discharged to:: Assisted living Living Arrangements: Parent (lives with Mom)             Home Equipment: None Additional Comments: Lives in an apartment, no stairs    Prior Function Level of Independence: Independent               Hand Dominance        Extremity/Trunk Assessment               Lower Extremity Assessment:  (Bilateral numbness, worse on L)         Communication   Communication: No difficulties  Cognition Arousal/Alertness: Awake/alert Behavior During Therapy: WFL for tasks assessed/performed Overall Cognitive Status: Within Functional Limits for tasks assessed                      General Comments      Exercises        Assessment/Plan    PT Assessment Patient needs continued PT services  PT Diagnosis Generalized weakness   PT Problem List Decreased strength;Decreased balance;Decreased mobility  PT Treatment Interventions DME instruction;Gait training;Functional mobility training;Therapeutic exercise;Balance training   PT Goals (Current goals can be found in the  Care Plan section) Acute Rehab PT Goals Patient Stated Goal: To keep walking, even if just around apartment complex PT Goal Formulation: With patient Potential to Achieve Goals: Good    Frequency Min 3X/week   Barriers to discharge        Co-evaluation               End of Session Equipment Utilized During Treatment: Gait belt Activity Tolerance: Patient tolerated treatment well Patient left: in bed;with call bell/phone within reach Nurse Communication: Mobility status         Time: 4193-7902 PT Time Calculation (min): 16 min   Charges:         PT G CodesShirleen Schirmer 03/02/2014, 3:39 PM  Shirleen Schirmer, [place] Office: [phone]

## 2014-03-02 NOTE — Progress Notes (Signed)
NUTRITION FOLLOW-UP  Pt meets criteria for SEVERE MALNUTRITION in the context of chronic illness as evidenced by a 10% weight loss in one month, energy intake of </= 75% for >/= 1 month, and severe fat and muscle mass depletion.  DOCUMENTATION CODES Per approved criteria  -Severe malnutrition in the context of chronic illness -Underweight   INTERVENTION: Continue Ensure Complete po TID between meals, each supplement provides 350 kcal and 13 grams of protein.   Provide Magic cup TID between meals, each supplement provides 290 kcal and 9 grams of protein  NUTRITION DIAGNOSIS: Malnutrition related to chronic illness as evidenced by 10% weight loss in one month, energy intake of </= 75% for >/= 1 month, and severe fat and muscle mass depletion, ongoing  Goal: Pt to meet >/= 90% of their estimated nutrition needs,met  Monitor:  PO intake, weight trends, labs, I/O's  33 y.o. male  Admitting Dx: Staphylococcus aureus bacteremia  ASSESSMENT: Pt with PMH of HIV and hodgkins lymphoma undergoing treatment. Pt presented with itchy rash on groin, scrotum, and buttocks. While evaluated in the ED, pt was noted to be in acute kidney injury.  8/4-Spoke with pt. Pt reports his appetite currently is good. He did not eat breakfast this morning because he reports he usually does not eat this early and was not hungry. Pt reports however that he will be hungry during lunch time. -Usual Diet Recall: Breakfast: does not eat Lunch: Some kind of sandwich Dinner: Mom cooks him dinner (meat, vegetables, starch) He also drinks Ensure three times a day. -Pt does report that over the past month that he has noticed he has been eating less than usual. His appetite has decreased a bit. Pt contributes that cause to his start of chemotherapy, which he started on a couple of months ago. Pt reports he does not have any significant side effects with the therapy. He denies any nausea or vomiting. Pt reports he is unsure  of any weight loss. Pt reports his usual body weight of 143 lbs, where he says he last weighed 2 weeks ago. Per EPIC records, pt has had a 10% weight loss in one month. -Pt requests he would like Ensure ordered while hospitalized. Will order.Pt was educated to continue to drink his Ensures to help him intake more calories and protein to prevent further weight loss. Pt expressed understanding.  8/10- Pt with Staphylococcus aureus bacteremia found 8/5. Pt reports having a good appetite. Meal completion has varied from 30-100%, however family members have been bringing him food from home to eat. Pt reports drinking the Ensure and likes them. Pt was open to trying additional oral supplements to increase his calorie and protein intake. Magic cup was offered and pt is willing to try. Will order. Pt denies any nausea, stomach pains, or difficulties chewing or swallowing.  Labs:High creatinine. Low potassium, calcium, GFR  Height: Ht Readings from Last 1 Encounters:  02/25/14 6' 0.83" (1.85 m)    Weight: Wt Readings from Last 1 Encounters:  03/01/14 132 lb 11.2 oz (60.192 kg)  Admit weight: 134 lbs  BMI:  Body mass index is 17.59 kg/(m^2). Underweight  Re-Estimated Nutritional Needs: Kcal: 2100-2300 Protein: 100-110 grams Fluid: 2.1 L - 2.3 L/day  Skin: incision on right upper chest  Diet Order: General   Intake/Output Summary (Last 24 hours) at 03/02/14 1727 Last data filed at 03/02/14 1512  Gross per 24 hour  Intake 2028.75 ml  Output   1585 ml  Net 443.75 ml  Last BM: 8/8  Labs:   Recent Labs Lab 02/26/14 0530 02/27/14 0700 03/02/14 0430  NA 140 140 138  K 3.4* 4.3 3.6*  CL 104 106 105  CO2 _0 BUN _1 CREATININE 1.72* 1.64* 1.49*  CALCIUM 8.3* 8.3* 8.3*  GLUCOSE 90 88 86    CBG (last 3)  No results found for this basename: GLUCAP,  in the last 72 hours  Scheduled Meds: . calcium carbonate  800 mg of elemental calcium Oral TID  .  ceFAZolin  (ANCEF) IV  2 g Intravenous 3 times per day  . clotrimazole-betamethasone   Topical BID  . feeding supplement (ENSURE COMPLETE)  237 mL Oral TID BM  . ferrous sulfate  325 mg Oral BID WC    Continuous Infusions:    Past Medical History  Diagnosis Date  . HIV disease 06/25/2012  . Skin lesion 07/03/2012  . Leg pain 07/03/2012  . Sinus tachycardia 08/06/2012  . Penile cyst 08/06/2012  . Cancer     Past Surgical History  Procedure Laterality Date  . 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  this after several previous I and Ds of rectal abcess.   [name] node biopsy Right 08/08/2013    Procedure: SUPRACLAVICULAR LYMPH NODE BIOPSY;  Surgeon: Gaye Pollack, MD;  Location: [facility];  Service: Thoracic;  Laterality: Right;  . 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 Tee without cardioversion N/A 02/27/2014    Procedure: TRANSESOPHAGEAL ECHOCARDIOGRAM (TEE);  Surgeon: Pixie Casino, MD;  Location: [facility] ENDOSCOPY;  Service: Cardiovascular;  Laterality: N/A;    Kallie Locks, MS, Provisional LDN Pager # [phone] After hours/ weekend pager # [phone]

## 2014-03-02 NOTE — Progress Notes (Signed)
Pt has an order for daily wound dressing at the portacath insertion site. Dressing is quite soaked, RN offered to have it changed but patient declined. Education given.

## 2014-03-02 NOTE — Progress Notes (Signed)
PROGRESS NOTE    Thomas [name] GDJ:[id number] DOB: 1981/06/22 DOA: 02/23/2014 PCP: Philis Fendt, MD  HPI/Brief narrative 33 y/o male patient with h/o HIV (follows with Dr. Linus Salmons), Hodgkin's Lymphoma on chemo - last 2 weeks ago(follows with Dr. Alen Blew), admitted on 8/3 with worsening pruritis of left side groin and thigh rash that he had for 5-7 days PTA and was not getting better with home remedies including streoid cream. Last CD4 in April was 360, viral load undetectable at that time. 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 He has MSSA bacteremia- etiology- ? Shingles rash vs port. On Ancef. 2 D Echo neg. TEE showed possible vegetation on Porta a cath tip- removed 8/7. Supposed to get a PICC 8/8 but 1/2 blood culture shows gram neg rods> Contaminant. Status post right percutaneous nephrostomy for hydronephrosis. Status post LP to evaluate for LLE numbness-? leptomeningeal involvement of lymphoma.  Assessment/Plan:  1. Acute on Chronic Renal Failure: unclear etiology. States no GI symptoms and appetite good. Denies NSAID's. ? ATN from hypotension. Renal US shows stable right hydronephrosis. Uric acid and CK normal. Chemo meds- less likely etiology. Baseline creat: fluctuating: ? 1.6-1.9.  Creatinine continues to improve. Follow BMP 2. Moderate to severe right hydronephrosis/ hydroureter secondary to obstructing ureteral calculi: Renal ultrasound earlier had shown stable right hydronephrosis. MRI thoracic and lumbar spine done to evaluate LLE numbness revealed moderate to severe right hydronephrosis. Urology consulted and CT abdomen and pelvis without contrast was obtained which confirmed right hydronephrosis/hydroureter with obstructing ureteral calculi. Nephrology requested percutaneous nephrostomy by IR. The hydronephrosis seems to be a chronic finding since January 2015 and not sure how much creatinine will improve. s/p a dose of  IV Rocephin on 8/9 to cover for gram-negative bacteria until final results of percutaneous nephrostomy urine culture (urine was cloudy) results available. Initial urine culture 8/3 was negative. Hold antibiotics for this indication. 3. Non AG metabolic acidosis: Unclear etiology. Has had this on reviewing past labs earlier this year.? Secondary to HIV medications. Resolved after IV bicarbonate infusion- DC'ed.  4. MSSA bacteremia secondary to infected Port-A-Cath: 2 of 2 blood cultures positive. Source-? From infected shingles versus Port a cath. Even though he has bacteremia, do not believe that his initial presentation was consistent with sepsis- he has chronic asymptomatic hypotension. 2 D Echo negative for vegetations. Initially was empirically on Van and Ancef > now narrowed to Ancef. ID follow up appreciated. Surveillance blood culture 8/5: 1/2 positive for Gram neg rods- ? contaminant repeat..  TEE 8/7 shows thickened port a cath tip in the SVC concerning for thrombus and or vegetation-DC'ed Port-A-Cath 8/7. Continue Ancef via peripheral IV. Discussed with ID on 8/10 >proceed with PICC line. 5. Hypotension: chronic and asymptomatic. 6. Shingles/Skin rash: over left side of groin, thigh and buttock. Evaluated by ID and consistent with shingles involving S1 dermatome-all lesions mostly dried up-no isolation or treatment recommended at this time. 7. HIV: meds held by ID. 8. Hodgkin's disease: chemo was due on 02/27/14. Dr. Alen Blew input appreciated-outpatient followup regarding chemotherapy. MRI spine suggests leptomeningeal involvement-LP done 8/9-followup CSF results. 9. Hypokalemia: replaced 10. Chronic Anemia: Likely 2/2 to chronic disease, HIV, lymphoma and Rx. No reported bleeding. Transfuse if Hb < 7. Stable. 11. Thrombocytopenia: chronic and stable. 12. Severe malnutrition in the context of chronic illness & Underweight: Nutrition consult appreciated- Mx per them. 50. 1/2 blood cultures 8/5,  Diptheroids: Likely  contaminant. Repeated blood cultures 8/8. Postpone PICC placement. 14. 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 Neurology suspects HSV involvement. LP done. Followup CSF results. MRI brain suspicious for leptomeningeal involvement. Discussed with oncology will follow outpatient and consider intrathecal chemotherapy if cytology consistent with lymphoma.    Code Status: Full Family Communication:  discussed with patient's mother (patient consented) on 8/8.  Disposition Plan: DC home when medically stable-? 8/11.   Consultants:  Infectious disease  Cardiology- for TEE  Interventional radiology-to remove Port-A-Cath  Neurology  Urology  Interventional radiology  Procedures:  TEE 8/7 IMPRESSION:  1. Thickened port-a-cath tip in the SVC concerning for thrombus and/or vegetation 2. No evidence for valvular endocarditis. 3. No LAA thrombus 4. LVEF 55-60% with normal wall motion RECOMMENDATIONS:  1. Catheter tip may be the source of bacteremia. Consider port-a-cath removal.   Right percutaneous nephrostomy 8/9  Lumbar puncture 8/9  Antibiotics:  Vancomycin- DC'ed  Ancef  IV Rocephin x1: 8/9  Subjective: Mild pain at right PCN site.   Objective: Filed Vitals:   03/01/14 1712 03/01/14 1747 03/01/14 2009 03/02/14 0359  BP: 85/50 100/65 91/54 93/56   Pulse: 70 74 94 96  Temp:  97.9 F (36.6 C) 98.7 F (37.1 C) 98.6 F (37 C)  TempSrc:  Oral Oral Oral  Resp:  18 17 16   Height:      Weight:   60.192 kg (132 lb 11.2 oz)   SpO2:  97% 100% 98%    Intake/Output Summary (Last 24 hours) at 03/02/14 1631 Last data filed at 03/02/14 1512  Gross per 24 hour  Intake 2028.75 ml  Output   1585 ml  Net 443.75 ml   Filed Weights   02/27/14 2100 02/28/14 2052 03/01/14 2009  Weight: 62.143 kg (137 lb) 60.192 kg (132 lb 11.2 oz) 60.192 kg (132 lb 11.2 oz)      Exam:  General exam: Pleasant young male lying comfortably in bed. Does not look toxic or septic. Respiratory system: Clear. No increased work of breathing. Cardiovascular system: S1 & S2 heard, RRR. No JVD, murmurs, gallops, clicks or pedal edema. Gastrointestinal system: Abdomen is nondistended, soft and nontender. Normal bowel sounds heard. Right PCN  Central nervous system: Alert and oriented. No focal neurological deficits. Extremities: Symmetric 5 x 5 power. Patchy diminished sensation left lower extremity circumferentially up to groin. Skin: Excoriated drying skin rash over left groin, extending to left posterior thing, left buttock and hip.   Data Reviewed: Basic Metabolic Panel:  Recent Labs Lab 02/24/14 1715 02/25/14 0540 02/26/14 0530 02/27/14 0700 03/02/14 0430  NA 136* 140 140 140 138  K 3.3* 3.4* 3.4* 4.3 3.6*  CL 107 113* 104 106 105  CO2 15* 15* 25 21 21   GLUCOSE 96 92 90 88 86  BUN 30* 22 13 12 15   CREATININE 2.61* 2.23* 1.72* 1.64* 1.49*  CALCIUM 7.9* 8.0* 8.3* 8.3* 8.3*   Liver Function Tests:  Recent Labs Lab 02/23/14 1722  AST 18  ALT 25  ALKPHOS 183*  BILITOT 0.3  PROT 7.9  ALBUMIN 3.2*   No results found for this basename: LIPASE, AMYLASE,  in the last 168 hours No results found for this basename: AMMONIA,  in the last 168 hours CBC:  Recent Labs Lab 02/23/14 1722 02/24/14 0340 02/25/14 0540 02/27/14 0700 03/02/14 0430  WBC 6.3 4.9 4.5 8.0 6.5  NEUTROABS 5.1  --   --   --   --  HGB 9.9* 7.8* 7.8* 8.0* 8.0*  HCT 28.5* 22.7* 23.2* 23.4* 24.1*  MCV 97.9 95.8 100.0 99.6 100.0  PLT 88* 94* 92* 158 252   Cardiac Enzymes:  Recent Labs Lab 02/24/14 1715  CKTOTAL 74   BNP (last 3 results) No results found for this basename: PROBNP,  in the last 8760 hours CBG: No results found for this basename: GLUCAP,  in the last 168 hours  Recent Results (from the past 240 hour(s))  CULTURE, BLOOD (ROUTINE X 2)     Status: None    Collection Time    02/23/14  6:08 PM      Result Value Ref Range Status   Specimen Description BLOOD RIGHT ARM   Final   Special Requests BOTTLES DRAWN AEROBIC AND ANAEROBIC 10CC   Final   Culture  Setup Time     Final   Value: 02/23/2014 22:16     Performed at [facility]   Culture     Final   Value: STAPHYLOCOCCUS AUREUS     Note: RIFAMPIN AND GENTAMICIN SHOULD NOT BE USED AS SINGLE DRUGS FOR TREATMENT OF STAPH INFECTIONS. This organism is presumed to be Clindamycin resistant based on detection of inducible Clindamycin resistance.     Note: Gram Stain Report Called to,Read Back By and Verified With: ANDY BRAKE@3 :00PM ON 02/24/14 BY DANTS     Performed at [facility]   Report Status 02/26/2014 FINAL   Final   Organism ID, Bacteria STAPHYLOCOCCUS AUREUS   Final  CULTURE, BLOOD (ROUTINE X 2)     Status: None   Collection Time    02/23/14  6:30 PM      Result Value Ref Range Status   Specimen Description BLOOD LEFT ARM   Final   Special Requests BOTTLES DRAWN AEROBIC AND ANAEROBIC 10CC   Final   Culture  Setup Time     Final   Value: 02/23/2014 22:16     Performed at [facility]   Culture     Final   Value: STAPHYLOCOCCUS AUREUS     Note: SUSCEPTIBILITIES PERFORMED ON PREVIOUS CULTURE WITHIN THE LAST 5 DAYS.     Note: Gram Stain Report Called to,Read Back By and Verified With: TY HICKS ON 02/24/2014 AT 10:44P BY WILEJ     Performed at [facility]   Report Status 02/26/2014 FINAL   Final  URINE CULTURE     Status: None   Collection Time    02/23/14  9:46 PM      Result Value Ref Range Status   Specimen Description URINE, CLEAN CATCH   Final   Special Requests NONE   Final   Culture  Setup Time     Final   Value: 02/24/2014 04:09     Performed at [facility]     Final   Value: NO GROWTH     Performed at [facility]   Culture     Final   Value: NO GROWTH     Performed at [facility]   Report Status  02/25/2014 FINAL   Final  GC/CHLAMYDIA PROBE AMP     Status: None   Collection Time    02/23/14  9:46 PM      Result Value Ref Range Status   CT Probe RNA NEGATIVE  NEGATIVE Final   GC Probe RNA NEGATIVE  NEGATIVE Final   Comment: (NOTE)                                                                                               **  Normal Reference Range: Negative**          Assay performed using the Gen-Probe APTIMA COMBO2 (R) Assay.     Acceptable specimen types for this assay include APTIMA Swabs (Unisex,     endocervical, urethral, or vaginal), first void urine, and ThinPrep     liquid based cytology samples.     Performed at [place], BLOOD (ROUTINE X 2)     Status: None   Collection Time    02/25/14 12:20 PM      Result Value Ref Range Status   Specimen Description BLOOD LEFT ARM   Final   Special Requests BOTTLES DRAWN AEROBIC AND ANAEROBIC 10CC   Final   Culture  Setup Time     Final   Value: 02/25/2014 17:15     Performed at [facility]   Culture     Final   Value:        BLOOD CULTURE RECEIVED NO GROWTH TO DATE CULTURE WILL BE HELD FOR 5 DAYS BEFORE ISSUING A FINAL NEGATIVE REPORT     Performed at [facility]   Report Status PENDING   Incomplete  CULTURE, BLOOD (ROUTINE X 2)     Status: None   Collection Time    02/25/14 12:40 PM      Result Value Ref Range Status   Specimen Description BLOOD LEFT ARM   Final   Special Requests BOTTLES DRAWN AEROBIC ONLY 10CC   Final   Culture  Setup Time     Final   Value: 02/25/2014 17:15     Performed at [facility]   Culture     Final   Value: DIPHTHEROIDS(CORYNEBACTERIUM SPECIES)     Note: Standardized susceptibility testing for this organism is not available.     Note: Gram Stain Report Called to,Read Back By and Verified With:  BECKA QIU;0143;02/28/14;SMIAS PREVIOUSLY REPORTED AS GRAM NEGATIVE RODS CORRECTED RESULTS CALLED TO: LAURA JAMA 03/01/14 @ 11:32AM BY RUSCOE A.     Performed  at [facility]   Report Status 03/01/2014 FINAL   Final  CULTURE, BLOOD (ROUTINE X 2)     Status: None   Collection Time    02/28/14  8:30 PM      Result Value Ref Range Status   Specimen Description BLOOD RIGHT HAND   Final   Special Requests BOTTLES DRAWN AEROBIC ONLY 10CC   Final   Culture  Setup Time     Final   Value: 03/01/2014 03:12     Performed at [facility]   Culture     Final   Value:        BLOOD CULTURE RECEIVED NO GROWTH TO DATE CULTURE WILL BE HELD FOR 5 DAYS BEFORE ISSUING A FINAL NEGATIVE REPORT     Performed at [facility]   Report Status PENDING   Incomplete  CULTURE, BLOOD (ROUTINE X 2)     Status: None   Collection Time    02/28/14  8:31 PM      Result Value Ref Range Status   Specimen Description BLOOD LEFT ANTECUBITAL   Final   Special Requests BOTTLES DRAWN AEROBIC AND ANAEROBIC 10CC EA   Final   Culture  Setup Time     Final   Value: 03/01/2014 03:12     Performed at [facility]   Culture     Final   Value:        BLOOD CULTURE RECEIVED  NO GROWTH TO DATE CULTURE WILL BE HELD FOR 5 DAYS BEFORE ISSUING A FINAL NEGATIVE REPORT     Performed at [facility]   Report Status PENDING   Incomplete  CSF CULTURE     Status: None   Collection Time    03/01/14  2:25 PM      Result Value Ref Range Status   Specimen Description CSF   Final   Special Requests NONE   Final   Gram Stain     Final   Value: CYTOSPIN WBC PRESENT,BOTH PMN AND MONONUCLEAR     NO ORGANISMS SEEN     Performed at [facility]     Performed at [facility]   Culture     Final   Value: NO GROWTH     Performed at [facility]   Report Status PENDING   Incomplete  GRAM STAIN     Status: None   Collection Time    03/01/14  2:25 PM      Result Value Ref Range Status   Specimen Description CSF   Final   Special Requests NONE   Final   Gram Stain     Final   Value: CYTOSPIN SLIDE     WBC PRESENT,BOTH PMN AND MONONUCLEAR      NO ORGANISMS SEEN   Report Status 03/01/2014 FINAL   Final  BODY FLUID CULTURE     Status: None   Collection Time    03/01/14  2:31 PM      Result Value Ref Range Status   Specimen Description FLUID RIGHT KIDNEY   Final   Special Requests URINE FROM RIGHT NEPHROSTOMY TUBE   Final   Gram Stain     Final   Value: ABUNDANT WBC PRESENT,BOTH PMN AND MONONUCLEAR     NO SQUAMOUS EPITHELIAL CELLS SEEN     NO ORGANISMS SEEN     Performed at [facility]   Culture     Final   Value: NO GROWTH     Performed at [facility]   Report Status PENDING   Incomplete       Studies: Ct Abdomen Pelvis Wo Contrast  03/01/2014   CLINICAL DATA:  Right hydronephrosis and bilateral nephrolithiasis by ultrasound. Renal failure. History of lymphoma.  EXAM: CT ABDOMEN AND PELVIS WITHOUT CONTRAST  TECHNIQUE: Multidetector CT imaging of the abdomen and pelvis was performed following the standard protocol without IV contrast.  COMPARISON:  Renal ultrasound on 02/23/2014 and PET scan on 12/10/2013.  FINDINGS: There is evidence significant right-sided hydronephrosis and hydroureter as visualized on the recent renal ultrasound study. [place] ureter is followed into the pelvis, there is a column of 3 adjacent significant sized calculi located roughly at the level of the mid sacrum. All 3 of the calculi measure approximately 10 mm in greatest diameter.  There are also a number of intrarenal calculi bilaterally. On the right side a 7 mm calculus is located in the lower pole collecting system. In the left kidney, the largest calculus measures 8 mm in the upper pole. There are at least 8 other smaller calculi dispersed throughout the left collecting system measuring on the order of roughly 2-3 mm each in size. No left-sided hydronephrosis or ureteral calculi on the left.  Unenhanced appearance of the liver, spleen, pancreas, adrenal glands and bowel are unremarkable. Layering higher density in the gallbladder lumen likely  relates to calculi. No evidence of gallbladder distention, inflammation or biliary dilatation.  The bladder is unremarkable. No free fluid or abscess is identified. There are some residual mildly prominent retroperitoneal lymph nodes identified which are similar in size compared to the prior PET scan and most prominent in the left periaortic region. Largest measures approximately 11 mm in short axis. No bony abnormalities are seen.  IMPRESSION: 1. Significant right-sided hydronephrosis and hydroureter secondary to a column of three roughly 10 mm diameter calculi stacked on top of one another in the distal ureter at the level of the mid sacrum. 2. Bilateral intrarenal calculi with single calculus in the lower pole of the right kidney and multiple calculi in the left kidney. The left kidney demonstrates no evidence of hydronephrosis. 3. Some residual mildly prominent left para-aortic retroperitoneal lymph nodes similar in size to the PET scan in May.   Electronically Signed   By: Aletta Edouard M.D.   On: 03/01/2014 08:55   Ct Head Wo Contrast  03/01/2014   CLINICAL DATA:  33 year old male with left lower extremity weakness. 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 Initial encounter.  EXAM: CT HEAD WITHOUT CONTRAST  TECHNIQUE: Contiguous axial images were obtained from the base of the skull through the vertex without intravenous contrast.  COMPARISON:  Head CT without contrast 08/02/2013. Thoracic spine MRI 02/28/2014.  FINDINGS: Visualized paranasal sinuses and mastoids are clear. No osseous abnormality identified. Visualized orbits and scalp soft tissues are within normal limits.  Stable cerebral volume. No midline shift, mass effect, or evidence of intracranial mass lesion. Stable, normal ventricular size and configuration. Stable, normal basilar cisterns. No acute intracranial hemorrhage identified. Gray-white matter differentiation is within normal limits throughout the brain. No evidence of cortically  based acute infarction identified. No suspicious intracranial vascular hyperdensity.  IMPRESSION: Stable and normal noncontrast CT appearance of the brain. No contraindication to lumbar puncture identified.   Electronically Signed   By: Lars Pinks M.D.   On: 03/01/2014 11:39   Mr Jeri Cos QI Contrast  03/02/2014   CLINICAL DATA:  Lymphoma. Question septic emboli to the brain. Abnormal MRI of the thoracic spine with evidence of leptomeningeal enhancement in cord enhancement.  EXAM: MRI HEAD WITHOUT AND WITH CONTRAST  TECHNIQUE: Multiplanar, multiecho pulse sequences of the brain and surrounding structures were obtained without and with intravenous contrast.  CONTRAST:  44mL MULTIHANCE GADOBENATE DIMEGLUMINE 529 MG/ML IV SOLN  COMPARISON:  MRI of the thoracic spine and lumbar spine with contrast 02/28/2014  FINDINGS: Mild diffuse leptomeningeal enhancement is evident. No focal dural or parenchymal enhancement is present.  No hemorrhage or mass lesion is present. There is no acute infarct. No significant white matter disease is evident.  The ventricles are of normal size. No significant extraaxial fluid collection is present.  Flow is present in the major intracranial arteries. The globes and orbits are intact. The paranasal sinuses and mastoid air cells are clear.  IMPRESSION: 1. 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 This could however, be result of the recent lumbar puncture. 2. No parenchymal or dural enhancement. 3. Otherwise normal MRI of the brain.   Electronically Signed   By: Lawrence Santiago M.D.   On: 03/02/2014 12:49   Mr Thoracic Spine W Wo Contrast  02/28/2014   CLINICAL DATA:  33 year old male with history of HIV, lymphoma, bacteremia. Left lower extremity numbness.  EXAM: MRI THORACIC SPINE WITHOUT AND WITH CONTRAST  MRI LUMBOSACRAL PLEXUS WITHOUT AND WITH CONTRAST.  TECHNIQUE: Multiplanar and multiecho pulse sequences of the thoracic spine and lumbosacral  plexus  were obtained without and with intravenous contrast.  CONTRAST:  23mL MULTIHANCE GADOBENATE DIMEGLUMINE 529 MG/ML IV SOLN  COMPARISON:  PET-CT 11/2013 and earlier. CT Abdomen and Pelvis 08/04/2013  FINDINGS: MR THORACIC SPINE FINDINGS  Limited sagittal imaging of the cervical spine appears negative except for diffusely abnormal bone marrow signal, including at the visualized skullbase.  Preserved thoracic vertebral height and alignment, but diffusely abnormal bone marrow signal, heterogeneously decreased on T1 and T2 imaging. Marrow signal is heterogeneously increased on STIR, and following contrast there is widespread heterogeneous bone marrow enhancement.  Axial post-contrast images are degraded by motion. Sagittal post-contrast images indicate multifocal abnormal enhancement along the dorsal spinal cord or dorsal surface of the spinal cord. The levels T4 through T7 are most affected in the upper spine. At the T10-T11 level there is a larger area of abnormal enhancement which appears to extend farther into the substance of the cord (series 23 image 6), in this corresponds to abnormal T2 signal within the dorsal cord (series 7, image 32).  There is also subtle abnormal dorsal column increased T2 signal in the upper cord corresponding to the areas of enhancement, T4 -T6.  No thoracic spinal cord expansion. Capacious thoracic thecal sac. No spinal stenosis. Conus medullaris is not included on these images.  No epidural process in the thoracic spine.  Trace layering right pleural effusion. Mildly increased signal in the deep tendon right lower lobe. Visualized upper abdominal viscera remarkable for decreased T2 signal in the liver and spleen.  MR LUMBOSACRAL PLEXUS FINDINGS  Study is intermittently degraded by motion artifact despite repeated imaging attempts.  The entire lumbar spine is not included. Diffusely abnormal bone marrow signal. These images suggest transitional lumbosacral anatomy, uncertain when  compared to the comparison exams.  Continued right hydronephrosis and right hydroureter related to an obstructing calculus at the level of the right sacrum, better demonstrated on 11/2013.  The conus medullaris is not included. Cauda equina nerve roots appear within normal limits on precontrast images. Suggestion of abnormal enhancement of the left S1 or S2 nerve roots at the L5-S1 level (series 20, image 16 and series 21, image 40. No other abnormal cauda equina enhancement identified.  Capacious lumbar spinal canal.  No spinal stenosis.  The exiting nerves of the lumbosacral plexus appear within normal limits. Mild vascular artifact related to the common iliac arteries and iliac bifurcations.  IMPRESSION: 1. 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 But there is a larger enhancing plaque at the T10-T11 level dorsally which appears to involve the substance of the cord. 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 Follow-up CSF analysis may confirm, but recommend Head CT prior to lumbar puncture to exclude intracranial mass effect. 2. Lumbosacral plexus was imaged and appears within normal limits. However, there is suggestion of abnormal enhancement of the left S1 or S2 nerve roots in the cauda equina at the L5-S1 level. 3. 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 Bone marrow treatment effect or hemosiderosis are less likely. 4. Chronic right side obstructive uropathy; moderate to severe right hydronephrosis and hydroureter due to an obstructing calculus at the level of the right sacrum.   Electronically Signed   By: Lars Pinks M.D.   On: 02/28/2014 21:59   Mr Lumbar Spine W Wo Contrast  02/28/2014   CLINICAL DATA:  33 year old male with history of HIV, lymphoma, bacteremia. Left lower extremity  numbness.  EXAM: MRI THORACIC SPINE WITHOUT AND WITH CONTRAST   MRI LUMBOSACRAL PLEXUS WITHOUT AND WITH CONTRAST.  TECHNIQUE: Multiplanar and multiecho pulse sequences of the thoracic spine and lumbosacral plexus were obtained without and with intravenous contrast.  CONTRAST:  65mL MULTIHANCE GADOBENATE DIMEGLUMINE 529 MG/ML IV SOLN  COMPARISON:  PET-CT 11/2013 and earlier. CT Abdomen and Pelvis 08/04/2013  FINDINGS: MR THORACIC SPINE FINDINGS  Limited sagittal imaging of the cervical spine appears negative except for diffusely abnormal bone marrow signal, including at the visualized skullbase.  Preserved thoracic vertebral height and alignment, but diffusely abnormal bone marrow signal, heterogeneously decreased on T1 and T2 imaging. Marrow signal is heterogeneously increased on STIR, and following contrast there is widespread heterogeneous bone marrow enhancement.  Axial post-contrast images are degraded by motion. Sagittal post-contrast images indicate multifocal abnormal enhancement along the dorsal spinal cord or dorsal surface of the spinal cord. The levels T4 through T7 are most affected in the upper spine. At the T10-T11 level there is a larger area of abnormal enhancement which appears to extend farther into the substance of the cord (series 23 image 6), in this corresponds to abnormal T2 signal within the dorsal cord (series 7, image 32).  There is also subtle abnormal dorsal column increased T2 signal in the upper cord corresponding to the areas of enhancement, T4 -T6.  No thoracic spinal cord expansion. Capacious thoracic thecal sac. No spinal stenosis. Conus medullaris is not included on these images.  No epidural process in the thoracic spine.  Trace layering right pleural effusion. Mildly increased signal in the deep tendon right lower lobe. Visualized upper abdominal viscera remarkable for decreased T2 signal in the liver and spleen.  MR LUMBOSACRAL PLEXUS FINDINGS  Study is intermittently degraded by motion artifact despite repeated imaging attempts.  The entire  lumbar spine is not included. Diffusely abnormal bone marrow signal. These images suggest transitional lumbosacral anatomy, uncertain when compared to the comparison exams.  Continued right hydronephrosis and right hydroureter related to an obstructing calculus at the level of the right sacrum, better demonstrated on 11/2013.  The conus medullaris is not included. Cauda equina nerve roots appear within normal limits on precontrast images. Suggestion of abnormal enhancement of the left S1 or S2 nerve roots at the L5-S1 level (series 20, image 16 and series 21, image 40. No other abnormal cauda equina enhancement identified.  Capacious lumbar spinal canal.  No spinal stenosis.  The exiting nerves of the lumbosacral plexus appear within normal limits. Mild vascular artifact related to the common iliac arteries and iliac bifurcations.  IMPRESSION: 1. 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 But there is a larger enhancing plaque at the T10-T11 level dorsally which appears to involve the substance of the cord. 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 Follow-up CSF analysis may confirm, but recommend Head CT prior to lumbar puncture to exclude intracranial mass effect. 2. Lumbosacral plexus was imaged and appears within normal limits. However, there is suggestion of abnormal enhancement of the left S1 or S2 nerve roots in the cauda equina at the L5-S1 level. 3. 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 Bone marrow treatment effect or hemosiderosis are less likely. 4. Chronic right side obstructive uropathy; moderate to severe right hydronephrosis and hydroureter due to an obstructing calculus at the level of the right sacrum.   Electronically Signed   By: Truman Hayward  [place] Crane M.D.   On: 02/28/2014 21:59   Ir Perc Nephrostomy Right  03/01/2014   CLINICAL DATA:   34 year old with HIV, lymphoma and right hydronephrosis due to a right ureter stone. 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  EXAM: FLUOROSCOPIC GUIDED LUMBAR PUNCTURE  ULTRASOUND AND FLUOROSCOPIC GUIDED PLACEMENT OF RIGHT NEPHROSTOMY TUBE.  Physician: Stephan Minister. Henn, MD  FLUOROSCOPY TIME:  3 min and 36 seconds  MEDICATIONS: 100 mcg fentanyl, 2 mg versed. A radiology nurse monitored the patient for moderate sedation.  ANESTHESIA/SEDATION: Moderate sedation time: 22 min  PROCEDURE: The procedures were explained to the patient. The risks and benefits of the procedure were discussed and the patient's questions were addressed. Informed consent was obtained from the patient.  The patient was placed prone on the interventional table. The skin was marked at the level of L4-L5. The back and right flank were prepped and draped in sterile fashion. Maximal barrier sterile technique was utilized including caps, mask, sterile gowns, sterile gloves, sterile drape, hand hygiene and skin antiseptic. 1% lidocaine was used for local anesthetic. A 20 gauge spinal needle was directed into the thecal sac along the right side of L4-L5. Clear CSF was identified. The opening pressure was roughly 20 cm. Approximately 10 mL of CSF was removed and sent for analysis. The stylet was placed back in the needle and the needle was removed without complication. Bandage was placed over the lumbar puncture site.  Attention was directed to the right kidney. Ultrasound demonstrated moderate right hydronephrosis. 1% lidocaine was used for local anesthetic in the right flank. A 21 gauge needle was directed into a dilated lower pole calyx with ultrasound guidance. Needle position was confirmed within the collecting system. A 0.018 wire was advanced to the collecting system and an Accustick dilator set was placed. Cloudy urine was aspirated from the catheter. A Bentson wire was advanced into the proximal ureter. The tract was  dilated and a 10.2 [place] multipurpose drain was advanced over the wire. Tube was reconstituted in the renal pelvis. Additional cloudy fluid was removed. Urine was sent for culture. Catheter was sutured to the skin and attached to gravity bag. Fluoroscopic and ultrasound images were taken and saved for documentation.  FINDINGS: Lumbar puncture: 10 mL of clear CSF was removed. Opening pressure was approximately 20 cm.  Nephrostomy tube: Moderate right hydronephrosis. Cloudy urine was removed from the right kidney. Nephrostomy tube is reconstituted in the renal pelvis.  COMPLICATIONS: None  IMPRESSION: Successful placement of a right percutaneous nephrostomy tube with ultrasound and fluoroscopic guidance. Fluid was sent for Gram stain and culture.  Successful fluoroscopic guided lumbar puncture.   Electronically Signed   By: Markus Daft M.D.   On: 03/01/2014 14:53   Ir US Guide Bx Asp/drain  03/01/2014   CLINICAL DATA:  33 year old with HIV, lymphoma and right hydronephrosis due to a right ureter stone. 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  EXAM: FLUOROSCOPIC GUIDED LUMBAR PUNCTURE  ULTRASOUND AND FLUOROSCOPIC GUIDED PLACEMENT OF RIGHT NEPHROSTOMY TUBE.  Physician: Stephan Minister. Henn, MD  FLUOROSCOPY TIME:  3 min and 36 seconds  MEDICATIONS: 100 mcg fentanyl, 2 mg versed. A radiology nurse monitored the patient for moderate sedation.  ANESTHESIA/SEDATION: Moderate sedation time: 22 min  PROCEDURE: The procedures were explained to the patient. The risks and benefits of the procedure were discussed and the patient's questions were addressed. Informed consent was obtained from the patient.  The patient was placed prone on  the interventional table. The skin was marked at the level of L4-L5. The back and right flank were prepped and draped in sterile fashion. Maximal barrier sterile technique was utilized including caps, mask, sterile gowns, sterile gloves, sterile drape, hand hygiene and skin  antiseptic. 1% lidocaine was used for local anesthetic. A 20 gauge spinal needle was directed into the thecal sac along the right side of L4-L5. Clear CSF was identified. The opening pressure was roughly 20 cm. Approximately 10 mL of CSF was removed and sent for analysis. The stylet was placed back in the needle and the needle was removed without complication. Bandage was placed over the lumbar puncture site.  Attention was directed to the right kidney. Ultrasound demonstrated moderate right hydronephrosis. 1% lidocaine was used for local anesthetic in the right flank. A 21 gauge needle was directed into a dilated lower pole calyx with ultrasound guidance. Needle position was confirmed within the collecting system. A 0.018 wire was advanced to the collecting system and an Accustick dilator set was placed. Cloudy urine was aspirated from the catheter. A Bentson wire was advanced into the proximal ureter. The tract was dilated and a 10.2 [place] multipurpose drain was advanced over the wire. Tube was reconstituted in the renal pelvis. Additional cloudy fluid was removed. Urine was sent for culture. Catheter was sutured to the skin and attached to gravity bag. Fluoroscopic and ultrasound images were taken and saved for documentation.  FINDINGS: Lumbar puncture: 10 mL of clear CSF was removed. Opening pressure was approximately 20 cm.  Nephrostomy tube: Moderate right hydronephrosis. Cloudy urine was removed from the right kidney. Nephrostomy tube is reconstituted in the renal pelvis.  COMPLICATIONS: None  IMPRESSION: Successful placement of a right percutaneous nephrostomy tube with ultrasound and fluoroscopic guidance. Fluid was sent for Gram stain and culture.  Successful fluoroscopic guided lumbar puncture.   Electronically Signed   By: Markus Daft M.D.   On: 03/01/2014 14:53   Ir Fluoro Guide Ndl Plmt / Bx  03/01/2014   CLINICAL DATA:  32 year old with HIV, lymphoma and right hydronephrosis due to a right ureter  stone. 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  EXAM: FLUOROSCOPIC GUIDED LUMBAR PUNCTURE  ULTRASOUND AND FLUOROSCOPIC GUIDED PLACEMENT OF RIGHT NEPHROSTOMY TUBE.  Physician: Stephan Minister. Henn, MD  FLUOROSCOPY TIME:  3 min and 36 seconds  MEDICATIONS: 100 mcg fentanyl, 2 mg versed. A radiology nurse monitored the patient for moderate sedation.  ANESTHESIA/SEDATION: Moderate sedation time: 22 min  PROCEDURE: The procedures were explained to the patient. The risks and benefits of the procedure were discussed and the patient's questions were addressed. Informed consent was obtained from the patient.  The patient was placed prone on the interventional table. The skin was marked at the level of L4-L5. The back and right flank were prepped and draped in sterile fashion. Maximal barrier sterile technique was utilized including caps, mask, sterile gowns, sterile gloves, sterile drape, hand hygiene and skin antiseptic. 1% lidocaine was used for local anesthetic. A 20 gauge spinal needle was directed into the thecal sac along the right side of L4-L5. Clear CSF was identified. The opening pressure was roughly 20 cm. Approximately 10 mL of CSF was removed and sent for analysis. The stylet was placed back in the needle and the needle was removed without complication. Bandage was placed over the lumbar puncture site.  Attention was directed to the right kidney. Ultrasound demonstrated moderate right hydronephrosis. 1% lidocaine was used for local anesthetic in  the right flank. A 21 gauge needle was directed into a dilated lower pole calyx with ultrasound guidance. Needle position was confirmed within the collecting system. A 0.018 wire was advanced to the collecting system and an Accustick dilator set was placed. Cloudy urine was aspirated from the catheter. A Bentson wire was advanced into the proximal ureter. The tract was dilated and a 10.2 [place] multipurpose drain was advanced over the wire. Tube was  reconstituted in the renal pelvis. Additional cloudy fluid was removed. Urine was sent for culture. Catheter was sutured to the skin and attached to gravity bag. Fluoroscopic and ultrasound images were taken and saved for documentation.  FINDINGS: Lumbar puncture: 10 mL of clear CSF was removed. Opening pressure was approximately 20 cm.  Nephrostomy tube: Moderate right hydronephrosis. Cloudy urine was removed from the right kidney. Nephrostomy tube is reconstituted in the renal pelvis.  COMPLICATIONS: None  IMPRESSION: Successful placement of a right percutaneous nephrostomy tube with ultrasound and fluoroscopic guidance. Fluid was sent for Gram stain and culture.  Successful fluoroscopic guided lumbar puncture.   Electronically Signed   By: Markus Daft M.D.   On: 03/01/2014 14:53        Scheduled Meds: . calcium carbonate  800 mg of elemental calcium Oral TID  .  ceFAZolin (ANCEF) IV  2 g Intravenous 3 times per day  . clotrimazole-betamethasone   Topical BID  . feeding supplement (ENSURE COMPLETE)  237 mL Oral TID BM  . ferrous sulfate  325 mg Oral BID WC   Continuous Infusions:    Principal Problem:   Staphylococcus aureus bacteremia Active Problems:   HIV disease   Hodgkin's disease   Hypokalemia   Thrombocytopenia   Acute renal failure   Shingles rash   Metabolic acidosis, normal anion gap (NAG)   CLABSI (central line-associated bloodstream infection)   Numbness of left lower extremity   Hydronephrosis, right & hydroureter: secondary to obstructing ureteral calculi    Time spent: 45 minutes.    Vernell Leep, MD, FACP, FHM. [facility] Pager [phone]  If 7PM-7AM, please contact night-coverage [email or web address] Password [email or web address] 03/02/2014, 4:31 PM    LOS: 7 days

## 2014-03-02 NOTE — Progress Notes (Signed)
Subjective: Patient reports improved sensation.   Exam: Filed Vitals:   03/02/14 0359  BP: 93/56  Pulse: 96  Temp: 98.6 F (37 C)  Resp: 16   Gen: In bed, NAD MS: Awake, alert,  VB:TYOMA, EOMI Sensory:decreasesed circumferentially in left leg.  MRI brain reviewed, ? Leptomeningeal changes, LP induced vs same process as thoracic cord.   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 Also possible would be VZV involvement. LP showed mild pleocytosis of unclear significance in this setting, with improvemetn feel that we can wait on further studies prior to therapy.   Recommendations: 1) f/u cytology, vzv by PCR 2) will continue to follow.   Roland Rack, MD [facility] [phone]  If 7pm- 7am, please page neurology on call as listed in [place].

## 2014-03-02 NOTE — Progress Notes (Signed)
Reviewed and agree  Johnell Bas, PT  [facility] Pager [phone] Office [phone]  

## 2014-03-02 NOTE — Progress Notes (Signed)
PT Cancellation Note  Patient Details Name: BREANNA MCDANIEL MRN: [id number] DOB: [birth year]   Cancelled Treatment:    Reason Eval/Treat Not Completed: Patient at procedure or test/unavailable.  Patient leaving for MRI.  Will return at later time for PT evaluation.   Despina Pole 03/02/2014, 12:27 PM Carita Pian. Sanjuana Kava, [place] Pager [phone]

## 2014-03-03 DIAGNOSIS — N1330 Unspecified hydronephrosis: Secondary | ICD-10-CM | POA: Diagnosis not present

## 2014-03-03 DIAGNOSIS — T80211A Bloodstream infection due to central venous catheter, initial encounter: Secondary | ICD-10-CM | POA: Diagnosis not present

## 2014-03-03 DIAGNOSIS — A4901 Methicillin susceptible Staphylococcus aureus infection, unspecified site: Secondary | ICD-10-CM | POA: Diagnosis not present

## 2014-03-03 DIAGNOSIS — N211 Calculus in urethra: Secondary | ICD-10-CM | POA: Diagnosis not present

## 2014-03-03 DIAGNOSIS — C8198 Hodgkin lymphoma, unspecified, lymph nodes of multiple sites: Secondary | ICD-10-CM

## 2014-03-03 DIAGNOSIS — B20 Human immunodeficiency virus [HIV] disease: Secondary | ICD-10-CM | POA: Diagnosis not present

## 2014-03-03 DIAGNOSIS — E43 Unspecified severe protein-calorie malnutrition: Secondary | ICD-10-CM

## 2014-03-03 DIAGNOSIS — Z48816 Encounter for surgical aftercare following surgery on the genitourinary system: Secondary | ICD-10-CM | POA: Diagnosis not present

## 2014-03-03 LAB — CULTURE, BLOOD (ROUTINE X 2): Culture: NO GROWTH

## 2014-03-03 LAB — BASIC METABOLIC PANEL
ANION GAP: 12 (ref 5–15)
BUN: 12 mg/dL (ref 6–23)
CHLORIDE: 105 meq/L (ref 96–112)
CO2: 21 mEq/L (ref 19–32)
Calcium: 8.5 mg/dL (ref 8.4–10.5)
Creatinine, Ser: 1.4 mg/dL — ABNORMAL HIGH (ref 0.50–1.35)
GFR calc Af Amer: 77 mL/min — ABNORMAL LOW (ref >90)
GFR calc non Af Amer: 66 mL/min — ABNORMAL LOW (ref >90)
Glucose, Bld: 80 mg/dL (ref 70–99)
Potassium: 3.8 mEq/L (ref 3.7–5.3)
Sodium: 138 mEq/L (ref 137–147)

## 2014-03-03 MED ORDER — ABACAVIR-DOLUTEGRAVIR-LAMIVUD 600-50-300 MG PO TABS: 1.0000 | ORAL_TABLET | Freq: Every day | ORAL | Status: DC

## 2014-03-03 MED ORDER — ENSURE COMPLETE PO LIQD: 237.0000 mL | Freq: Three times a day (TID) | ORAL | Status: DC

## 2014-03-03 MED ORDER — CEFAZOLIN SODIUM-DEXTROSE 2-3 GM-% IV SOLR: 2.0000 g | Freq: Three times a day (TID) | INTRAVENOUS | Status: AC

## 2014-03-03 MED ORDER — SODIUM CHLORIDE 0.9 % IJ SOLN: 10.0000 mL | INTRAMUSCULAR | Status: DC | PRN

## 2014-03-03 NOTE — Progress Notes (Signed)
Subjective: Patient continues to have improvements in LE strength and sensation.   Exam: Filed Vitals:   03/03/14 0817  BP: 76/55  Pulse: 88  Temp:   Resp:    Gen: In bed, NAD MS: Awake, alert,  HE:KBTCY, EOMI Motor: 4+/5 bilaterally in LE Sensory:decreasesed circumferentially in left leg, though im  LP results - pleocytosis at 8. Cytology is negative.   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 Also possible would be VZV involvement. LP showed mild pleocytosis of unclear significance in this setting, with improvement I am not sure if he would benefit much from antivirals especially given that he has renal impairment.   Given that his symptoms are improving, and the temporal correlation, I would favor VZV causing these changes, but this will certainly need to be confirmed.   Recommendations: 1) Initial cytology negative, if VZV PCR is negative, then would consider re-imaging MRI thoracic spine, and if changes persist, then performing repeat LP with larger volume sent fresh to cytology.(only 1 CC was sent from previous sample)    Roland Rack, MD [facility] [phone]  If 7pm- 7am, please page neurology on call as listed in [place].

## 2014-03-03 NOTE — Discharge Summary (Signed)
Physician Discharge Summary  Thomas [name] WLS:[id number] DOB: Aug 20, 1980 DOA: 02/23/2014  PCP: Philis Fendt, MD  Admit date: 02/23/2014 Discharge date: 03/03/2014  Time spent: Greater than 30 minutes  Recommendations for Outpatient Follow-up:  1. Dr. Nolene Ebbs, PCP in 3 days to be seen with repeat labs (CBC & BMP). 2. Dr. Scharlene Gloss, Infectious Disease on 03/26/2014 at 3:15 PM. 3. Dr. Raynelle Bring, Urology in 1-2 weeks. 4. Dr. Zola Button, Oncology 5. Home Health RN, PT, OT and cane  Discharge Diagnoses:  Principal Problem:   Staphylococcus aureus bacteremia Active Problems:   HIV disease   Hodgkin's disease   Hypokalemia   Thrombocytopenia   Acute renal failure   Shingles rash   Metabolic acidosis, normal anion gap (NAG)   CLABSI (central line-associated bloodstream infection)   Numbness of left lower extremity   Hydronephrosis, right & hydroureter: secondary to obstructing ureteral calculi   Discharge Condition: Improved & Stable  Diet recommendation: Heart Healthy diet.  Filed Weights   02/28/14 2052 03/01/14 2009 03/02/14 2054  Weight: 60.192 kg (132 lb 11.2 oz) 60.192 kg (132 lb 11.2 oz) 60.192 kg (132 lb 11.2 oz)    History of present illness:  33 y/o male patient with h/o HIV (follows with Dr. Linus Salmons), Hodgkin's Lymphoma on chemo - last 2 weeks ago(follows with Dr. Alen Blew), admitted on 8/3 with worsening pruritis of left side groin and thigh rash that he had for 5-7 days PTA and was not getting better with home remedies including streoid cream. Last CD4 in April was 360, viral load undetectable at that time. 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    Hospital Course:   1. Acute on Chronic Renal Failure: unclear etiology. States no GI symptoms and appetite good. Denies NSAID's. ? ATN from hypotension. Renal US shows stable right hydronephrosis. Uric acid and CK normal. Chemo meds- less likely etiology. Baseline creat:  fluctuating: ? 1.6-1.9. Creatinine continues to improve. Right hydronephrosis may have contributed some. Will have see where his creatinine levels out. Close OP follow up. 2. Moderate to severe right hydronephrosis/ hydroureter secondary to obstructing ureteral calculi: Renal ultrasound earlier had shown stable right hydronephrosis. MRI thoracic and lumbar spine done to evaluate LLE numbness revealed moderate to severe right hydronephrosis. Urology consulted and CT abdomen and pelvis without contrast was obtained which confirmed right hydronephrosis/hydroureter with obstructing ureteral calculi. Nephrology requested percutaneous nephrostomy by IR. The hydronephrosis seems to be a chronic finding since January 2015 and not sure how much creatinine will improve. s/p a dose of IV Rocephin on 8/9 to cover for gram-negative bacteria until final results of percutaneous nephrostomy urine culture (urine was cloudy) results available -Ucx negative to date. Initial urine culture 8/3 was negative. Hold antibiotics for this indication. Will follow up with Urology in 1-2 weeks as OP regarding further Mx- i.e renal scan to determine relative renal function on right kidney to see if definitive Rx with stone removal and renal preservation will help vs nephrectomy if he has minimal function. 3. Non AG metabolic acidosis: Unclear etiology. Has had this on reviewing past labs earlier this year.? Secondary to HIV medications. Resolved after IV bicarbonate infusion- DC'ed.  4. MSSA bacteremia secondary to infected Port-A-Cath: 2 of 2 blood cultures positive. Source-? From infected shingles versus Port a cath. Even though he has bacteremia, do not believe that his initial presentation was consistent with sepsis- he has chronic asymptomatic hypotension. 2 D  Echo negative for vegetations. Initially was empirically on Van and Ancef > now narrowed to Ancef. ID continued to follow. TEE 8/7 shows thickened port a cath tip in the SVC  concerning for thrombus and or vegetation-DC'ed Port-A-Cath 8/7. ID recommends 3 weeks IV Ancef via PICC through 03/16/2014. Post Rx, decision to be made by OP MD's (ID & Onc) re discontinuing PICC. OP ID follow up arranged. 5. Hypotension: chronic and asymptomatic. 6. Shingles/Skin rash: over left side of groin, thigh and buttock. Evaluated by ID and consistent with shingles involving S1 dermatome-all lesions mostly dried up-no isolation or treatment recommended at this time. 7. HIV: Atripla DC'ed and Triumeq started at DC. 8. Hodgkin's disease: Rx on hold. MRI spine suggests leptomeningeal involvement-LP done 8/9- initial cytology negative. As per Neurology, given that his symptoms are improving (without specific VZV Rx), and the temporal correlation, would favor VZV causing these changes, but this will certainly need to be confirmed. They recommend that if VZV PCR negative, then would consider re-imaging MRI Thoracic spine and if changes persist, then perform repeat LP with larger volume sent fresh to cytology (only 1 ml was sent from previous sample) & flow cytometry for evaluation of Lymphoma.Oncology follow up closely as OP. 9. Hypokalemia: replaced 10. Chronic Anemia: Likely 2/2 to chronic disease, HIV, lymphoma and Rx. No reported bleeding. Transfuse if Hb < 7. Stable. 11. Thrombocytopenia: chronic and stable. 12. Severe malnutrition in the context of chronic illness & Underweight: Nutrition consult appreciated- Mx per them. 13. 1/2 blood cultures 8/5, Diptheroids: Contaminant.  14. 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 MRI brain suspicious for leptomeningeal involvement. Please see discussion in # 8.   Consultants:  Infectious disease  Cardiology- for TEE  Interventional radiology-to remove Port-A-Cath  Neurology  Urology  Interventional radiology  Procedures:  TEE 8/7 IMPRESSION:   Thickened  port-a-cath tip in the SVC concerning for thrombus and/or vegetation  No evidence for valvular endocarditis.  No LAA thrombus  LVEF 55-60% with normal wall motion RECOMMENDATIONS:   Catheter tip may be the source of bacteremia. Consider port-a-cath removal.   Right percutaneous nephrostomy 8/9  Lumbar puncture 8/9 PICC placement 8/11  Discharge Exam:  Complaints: Improving of RLE numbness and strength. Denies dizziness, lightheadedness, headache, even in upright position. Anxious to go home.  Filed Vitals:   03/03/14 0814 03/03/14 0816 03/03/14 0817 03/03/14 1517  BP: 79/58 83/54 76/55  95/53  Pulse: 88 90 88 89  Temp: 98.1 F (36.7 C)   98.2 F (36.8 C)  TempSrc: Oral   Oral  Resp: 18   20  Height:      Weight:      SpO2: 100% 100% 100% 100%    General exam: Pleasant young male lying comfortably in bed. Does not look toxic or septic.  Respiratory system: Clear. No increased work of breathing.  Cardiovascular system: S1 & S2 heard, RRR. No JVD, murmurs, gallops, clicks or pedal edema.  Gastrointestinal system: Abdomen is nondistended, soft and nontender. Normal bowel sounds heard. Right PCN  Central nervous system: Alert and oriented. No focal neurological deficits.  Extremities: Symmetric 5 x 5 power. Patchy diminished sensation left lower extremity circumferentially up to groin.  Skin: Excoriated drying skin rash over left groin, extending to left posterior thing, left buttock and hip.   Discharge Instructions      Discharge Instructions   Call MD for:  extreme fatigue    Complete by:  As  directed      Call MD for:  persistant dizziness or light-headedness    Complete by:  As directed      Call MD for:  redness, tenderness, or signs of infection (pain, swelling, redness, odor or green/yellow discharge around incision site)    Complete by:  As directed      Call MD for:  temperature >100.4    Complete by:  As directed      Diet - low sodium heart healthy     Complete by:  As directed      Increase activity slowly    Complete by:  As directed             Medication List    STOP taking these medications       ciprofloxacin 500 MG tablet  Commonly known as:  CIPRO     efavirenz-emtricitabine-tenofovir 600-200-300 MG per tablet  Commonly known as:  ATRIPLA     lidocaine-prilocaine cream  Commonly known as:  EMLA     metroNIDAZOLE 500 MG tablet  Commonly known as:  FLAGYL     potassium chloride SA 20 MEQ tablet  Commonly known as:  K-DUR,KLOR-CON      TAKE these medications       Abacavir-Dolutegravir-Lamivud 600-50-300 MG Tabs  Commonly known as:  TRIUMEQ  Take 1 tablet by mouth daily.     calcium carbonate 500 MG chewable tablet  Commonly known as:  TUMS - dosed in mg elemental calcium  Chew 4 tablets (800 mg of elemental calcium total) by mouth 3 (three) times daily.     ceFAZolin 2-3 GM-% Solr  Commonly known as:  ANCEF  Inject 50 mLs (2 g total) into the vein every 8 (eight) hours. Discontinue after 03/16/14 doses.     feeding supplement (ENSURE COMPLETE) Liqd  Take 237 mLs by mouth 3 (three) times daily between meals.     ferrous sulfate 325 (65 FE) MG tablet  Take 1 tablet (325 mg total) by mouth 2 (two) times daily with a meal.     prochlorperazine 10 MG tablet  Commonly known as:  COMPAZINE  Take 1 tablet (10 mg total) by mouth every 6 (six) hours as needed for nausea or vomiting.       Follow-up Information   Follow up with Scharlene Gloss, MD On 03/26/2014. (3:15 PM)    Specialty:  Infectious Diseases   Contact information:   [address] [address] [phone]       Follow up with Nolene Ebbs A, MD. Schedule an appointment as soon as possible for a visit in 3 days. (To be seen with repeat labs (CBC & BMP).)    Specialty:  Internal Medicine   Contact information:   [place] [address] [phone]       Schedule an appointment as soon as possible for a  visit with [facility], MD.   Specialty:  Oncology   Contact information:   [place] [address] [phone]       Follow up with Dutch Gray, MD. Schedule an appointment as soon as possible for a visit in 1 week.   Specialty:  Urology   Contact information:   [place] [address] [phone]        The results of significant diagnostics from this hospitalization (including imaging, microbiology, ancillary and laboratory) are listed below for reference.    Significant Diagnostic Studies: Ct Abdomen Pelvis Wo  Contrast  03/01/2014   CLINICAL DATA:  Right hydronephrosis and bilateral nephrolithiasis by ultrasound. Renal failure. History of lymphoma.  EXAM: CT ABDOMEN AND PELVIS WITHOUT CONTRAST  TECHNIQUE: Multidetector CT imaging of the abdomen and pelvis was performed following the standard protocol without IV contrast.  COMPARISON:  Renal ultrasound on 02/23/2014 and PET scan on 12/10/2013.  FINDINGS: There is evidence significant right-sided hydronephrosis and hydroureter as visualized on the recent renal ultrasound study. [place] ureter is followed into the pelvis, there is a column of 3 adjacent significant sized calculi located roughly at the level of the mid sacrum. All 3 of the calculi measure approximately 10 mm in greatest diameter.  There are also a number of intrarenal calculi bilaterally. On the right side a 7 mm calculus is located in the lower pole collecting system. In the left kidney, the largest calculus measures 8 mm in the upper pole. There are at least 8 other smaller calculi dispersed throughout the left collecting system measuring on the order of roughly 2-3 mm each in size. No left-sided hydronephrosis or ureteral calculi on the left.  Unenhanced appearance of the liver, spleen, pancreas, adrenal glands and bowel are unremarkable. Layering higher density in the gallbladder lumen likely relates to calculi. No evidence of gallbladder distention,  inflammation or biliary dilatation.  The bladder is unremarkable. No free fluid or abscess is identified. There are some residual mildly prominent retroperitoneal lymph nodes identified which are similar in size compared to the prior PET scan and most prominent in the left periaortic region. Largest measures approximately 11 mm in short axis. No bony abnormalities are seen.  IMPRESSION: 1. Significant right-sided hydronephrosis and hydroureter secondary to a column of three roughly 10 mm diameter calculi stacked on top of one another in the distal ureter at the level of the mid sacrum. 2. Bilateral intrarenal calculi with single calculus in the lower pole of the right kidney and multiple calculi in the left kidney. The left kidney demonstrates no evidence of hydronephrosis. 3. Some residual mildly prominent left para-aortic retroperitoneal lymph nodes similar in size to the PET scan in May.   Electronically Signed   By: Aletta Edouard M.D.   On: 03/01/2014 08:55   Dg Chest 2 View  02/23/2014   CLINICAL DATA:  Wound check.  Hypotension  EXAM: CHEST  2 VIEW  COMPARISON:  02/23/2014  FINDINGS: Heart size and vascularity are normal. Lungs are clear without infiltrate or effusion. Port-A-Cath tip in the SVC.  IMPRESSION: No active cardiopulmonary disease.   Electronically Signed   By: Franchot Gallo M.D.   On: 02/23/2014 18:59   Ct Head Wo Contrast  03/01/2014   CLINICAL DATA:  33 year old male with left lower extremity weakness. 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 Initial encounter.  EXAM: CT HEAD WITHOUT CONTRAST  TECHNIQUE: Contiguous axial images were obtained from the base of the skull through the vertex without intravenous contrast.  COMPARISON:  Head CT without contrast 08/02/2013. Thoracic spine MRI 02/28/2014.  FINDINGS: Visualized paranasal sinuses and mastoids are clear. No osseous abnormality identified. Visualized orbits and scalp soft tissues are within normal limits.  Stable cerebral volume.  No midline shift, mass effect, or evidence of intracranial mass lesion. Stable, normal ventricular size and configuration. Stable, normal basilar cisterns. No acute intracranial hemorrhage identified. Gray-white matter differentiation is within normal limits throughout the brain. No evidence of cortically based acute infarction identified. No suspicious intracranial vascular hyperdensity.  IMPRESSION: Stable and normal noncontrast CT appearance of  the brain. No contraindication to lumbar puncture identified.   Electronically Signed   By: Lars Pinks M.D.   On: 03/01/2014 11:39   Mr Jeri Cos IP Contrast  03/02/2014   CLINICAL DATA:  Lymphoma. Question septic emboli to the brain. Abnormal MRI of the thoracic spine with evidence of leptomeningeal enhancement in cord enhancement.  EXAM: MRI HEAD WITHOUT AND WITH CONTRAST  TECHNIQUE: Multiplanar, multiecho pulse sequences of the brain and surrounding structures were obtained without and with intravenous contrast.  CONTRAST:  66mL MULTIHANCE GADOBENATE DIMEGLUMINE 529 MG/ML IV SOLN  COMPARISON:  MRI of the thoracic spine and lumbar spine with contrast 02/28/2014  FINDINGS: Mild diffuse leptomeningeal enhancement is evident. No focal dural or parenchymal enhancement is present.  No hemorrhage or mass lesion is present. There is no acute infarct. No significant white matter disease is evident.  The ventricles are of normal size. No significant extraaxial fluid collection is present.  Flow is present in the major intracranial arteries. The globes and orbits are intact. The paranasal sinuses and mastoid air cells are clear.  IMPRESSION: 1. 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 This could however, be result of the recent lumbar puncture. 2. No parenchymal or dural enhancement. 3. Otherwise normal MRI of the brain.   Electronically Signed   By: Lawrence Santiago M.D.   On: 03/02/2014 12:49   Mr Thoracic Spine W Wo Contrast  02/28/2014    CLINICAL DATA:  33 year old male with history of HIV, lymphoma, bacteremia. Left lower extremity numbness.  EXAM: MRI THORACIC SPINE WITHOUT AND WITH CONTRAST  MRI LUMBOSACRAL PLEXUS WITHOUT AND WITH CONTRAST.  TECHNIQUE: Multiplanar and multiecho pulse sequences of the thoracic spine and lumbosacral plexus were obtained without and with intravenous contrast.  CONTRAST:  31mL MULTIHANCE GADOBENATE DIMEGLUMINE 529 MG/ML IV SOLN  COMPARISON:  PET-CT 11/2013 and earlier. CT Abdomen and Pelvis 08/04/2013  FINDINGS: MR THORACIC SPINE FINDINGS  Limited sagittal imaging of the cervical spine appears negative except for diffusely abnormal bone marrow signal, including at the visualized skullbase.  Preserved thoracic vertebral height and alignment, but diffusely abnormal bone marrow signal, heterogeneously decreased on T1 and T2 imaging. Marrow signal is heterogeneously increased on STIR, and following contrast there is widespread heterogeneous bone marrow enhancement.  Axial post-contrast images are degraded by motion. Sagittal post-contrast images indicate multifocal abnormal enhancement along the dorsal spinal cord or dorsal surface of the spinal cord. The levels T4 through T7 are most affected in the upper spine. At the T10-T11 level there is a larger area of abnormal enhancement which appears to extend farther into the substance of the cord (series 23 image 6), in this corresponds to abnormal T2 signal within the dorsal cord (series 7, image 32).  There is also subtle abnormal dorsal column increased T2 signal in the upper cord corresponding to the areas of enhancement, T4 -T6.  No thoracic spinal cord expansion. Capacious thoracic thecal sac. No spinal stenosis. Conus medullaris is not included on these images.  No epidural process in the thoracic spine.  Trace layering right pleural effusion. Mildly increased signal in the deep tendon right lower lobe. Visualized upper abdominal viscera remarkable for decreased T2  signal in the liver and spleen.  MR LUMBOSACRAL PLEXUS FINDINGS  Study is intermittently degraded by motion artifact despite repeated imaging attempts.  The entire lumbar spine is not included. Diffusely abnormal bone marrow signal. These images suggest transitional lumbosacral anatomy, uncertain when compared to the comparison exams.  Continued right hydronephrosis  and right hydroureter related to an obstructing calculus at the level of the right sacrum, better demonstrated on 11/2013.  The conus medullaris is not included. Cauda equina nerve roots appear within normal limits on precontrast images. Suggestion of abnormal enhancement of the left S1 or S2 nerve roots at the L5-S1 level (series 20, image 16 and series 21, image 40. No other abnormal cauda equina enhancement identified.  Capacious lumbar spinal canal.  No spinal stenosis.  The exiting nerves of the lumbosacral plexus appear within normal limits. Mild vascular artifact related to the common iliac arteries and iliac bifurcations.  IMPRESSION: 1. 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 But there is a larger enhancing plaque at the T10-T11 level dorsally which appears to involve the substance of the cord. 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 Follow-up CSF analysis may confirm, but recommend Head CT prior to lumbar puncture to exclude intracranial mass effect. 2. Lumbosacral plexus was imaged and appears within normal limits. However, there is suggestion of abnormal enhancement of the left S1 or S2 nerve roots in the cauda equina at the L5-S1 level. 3. 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 Bone marrow treatment effect or hemosiderosis are less likely. 4. Chronic right side obstructive uropathy; moderate to severe right hydronephrosis and hydroureter due to an obstructing calculus at the  level of the right sacrum.   Electronically Signed   By: Lars Pinks M.D.   On: 02/28/2014 21:59   Mr Lumbar Spine W Wo Contrast  02/28/2014   CLINICAL DATA:  33 year old male with history of HIV, lymphoma, bacteremia. Left lower extremity numbness.  EXAM: MRI THORACIC SPINE WITHOUT AND WITH CONTRAST  MRI LUMBOSACRAL PLEXUS WITHOUT AND WITH CONTRAST.  TECHNIQUE: Multiplanar and multiecho pulse sequences of the thoracic spine and lumbosacral plexus were obtained without and with intravenous contrast.  CONTRAST:  49mL MULTIHANCE GADOBENATE DIMEGLUMINE 529 MG/ML IV SOLN  COMPARISON:  PET-CT 11/2013 and earlier. CT Abdomen and Pelvis 08/04/2013  FINDINGS: MR THORACIC SPINE FINDINGS  Limited sagittal imaging of the cervical spine appears negative except for diffusely abnormal bone marrow signal, including at the visualized skullbase.  Preserved thoracic vertebral height and alignment, but diffusely abnormal bone marrow signal, heterogeneously decreased on T1 and T2 imaging. Marrow signal is heterogeneously increased on STIR, and following contrast there is widespread heterogeneous bone marrow enhancement.  Axial post-contrast images are degraded by motion. Sagittal post-contrast images indicate multifocal abnormal enhancement along the dorsal spinal cord or dorsal surface of the spinal cord. The levels T4 through T7 are most affected in the upper spine. At the T10-T11 level there is a larger area of abnormal enhancement which appears to extend farther into the substance of the cord (series 23 image 6), in this corresponds to abnormal T2 signal within the dorsal cord (series 7, image 32).  There is also subtle abnormal dorsal column increased T2 signal in the upper cord corresponding to the areas of enhancement, T4 -T6.  No thoracic spinal cord expansion. Capacious thoracic thecal sac. No spinal stenosis. Conus medullaris is not included on these images.  No epidural process in the thoracic spine.  Trace layering right  pleural effusion. Mildly increased signal in the deep tendon right lower lobe. Visualized upper abdominal viscera remarkable for decreased T2 signal in the liver and spleen.  MR LUMBOSACRAL PLEXUS FINDINGS  Study is intermittently degraded by motion artifact despite  repeated imaging attempts.  The entire lumbar spine is not included. Diffusely abnormal bone marrow signal. These images suggest transitional lumbosacral anatomy, uncertain when compared to the comparison exams.  Continued right hydronephrosis and right hydroureter related to an obstructing calculus at the level of the right sacrum, better demonstrated on 11/2013.  The conus medullaris is not included. Cauda equina nerve roots appear within normal limits on precontrast images. Suggestion of abnormal enhancement of the left S1 or S2 nerve roots at the L5-S1 level (series 20, image 16 and series 21, image 40. No other abnormal cauda equina enhancement identified.  Capacious lumbar spinal canal.  No spinal stenosis.  The exiting nerves of the lumbosacral plexus appear within normal limits. Mild vascular artifact related to the common iliac arteries and iliac bifurcations.  IMPRESSION: 1. 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 But there is a larger enhancing plaque at the T10-T11 level dorsally which appears to involve the substance of the cord. 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 Follow-up CSF analysis may confirm, but recommend Head CT prior to lumbar puncture to exclude intracranial mass effect. 2. Lumbosacral plexus was imaged and appears within normal limits. However, there is suggestion of abnormal enhancement of the left S1 or S2 nerve roots in the cauda equina at the L5-S1 level. 3. 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 Bone marrow treatment effect or hemosiderosis are less  likely. 4. Chronic right side obstructive uropathy; moderate to severe right hydronephrosis and hydroureter due to an obstructing calculus at the level of the right sacrum.   Electronically Signed   By: Lars Pinks M.D.   On: 02/28/2014 21:59   US Renal  02/23/2014   CLINICAL DATA:  Acute renal failure.  EXAM: RENAL/URINARY TRACT ULTRASOUND COMPLETE  COMPARISON:  Renal ultrasound January 19, 2014.  FINDINGS: Right Kidney:  Length: 11.7 cm. Moderate right hydronephrosis. 1 cm echogenic upper pole and 6 mm echogenic middle pole calculi with acoustic shadowing.  Left Kidney:  Length: 13.2 cm. 7 mm echogenic upper pole and 5 mm echogenic interpolar renal calculi with acoustic shadowing. Trace left perinephric fluid.  Bladder:  Possible debris within the bladder.  IMPRESSION: Stable right hydronephrosis.  Bilateral nephrolithiasis.  Possible debris within the urinary bladder. Consider correlation with urinary analysis.   Electronically Signed   By: Elon Alas   On: 02/23/2014 21:01   Ir Perc Nephrostomy Right  03/01/2014   CLINICAL DATA:  33 year old with HIV, lymphoma and right hydronephrosis due to a right ureter stone. 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  EXAM: FLUOROSCOPIC GUIDED LUMBAR PUNCTURE  ULTRASOUND AND FLUOROSCOPIC GUIDED PLACEMENT OF RIGHT NEPHROSTOMY TUBE.  Physician: Stephan Minister. Henn, MD  FLUOROSCOPY TIME:  3 min and 36 seconds  MEDICATIONS: 100 mcg fentanyl, 2 mg versed. A radiology nurse monitored the patient for moderate sedation.  ANESTHESIA/SEDATION: Moderate sedation time: 22 min  PROCEDURE: The procedures were explained to the patient. The risks and benefits of the procedure were discussed and the patient's questions were addressed. Informed consent was obtained from the patient.  The patient was placed prone on the interventional table. The skin was marked at the level of L4-L5. The back and right flank were prepped and draped in sterile fashion. Maximal barrier  sterile technique was utilized including caps, mask, sterile gowns, sterile gloves, sterile drape, hand hygiene and skin antiseptic. 1%  lidocaine was used for local anesthetic. A 20 gauge spinal needle was directed into the thecal sac along the right side of L4-L5. Clear CSF was identified. The opening pressure was roughly 20 cm. Approximately 10 mL of CSF was removed and sent for analysis. The stylet was placed back in the needle and the needle was removed without complication. Bandage was placed over the lumbar puncture site.  Attention was directed to the right kidney. Ultrasound demonstrated moderate right hydronephrosis. 1% lidocaine was used for local anesthetic in the right flank. A 21 gauge needle was directed into a dilated lower pole calyx with ultrasound guidance. Needle position was confirmed within the collecting system. A 0.018 wire was advanced to the collecting system and an Accustick dilator set was placed. Cloudy urine was aspirated from the catheter. A Bentson wire was advanced into the proximal ureter. The tract was dilated and a 10.2 [place] multipurpose drain was advanced over the wire. Tube was reconstituted in the renal pelvis. Additional cloudy fluid was removed. Urine was sent for culture. Catheter was sutured to the skin and attached to gravity bag. Fluoroscopic and ultrasound images were taken and saved for documentation.  FINDINGS: Lumbar puncture: 10 mL of clear CSF was removed. Opening pressure was approximately 20 cm.  Nephrostomy tube: Moderate right hydronephrosis. Cloudy urine was removed from the right kidney. Nephrostomy tube is reconstituted in the renal pelvis.  COMPLICATIONS: None  IMPRESSION: Successful placement of a right percutaneous nephrostomy tube with ultrasound and fluoroscopic guidance. Fluid was sent for Gram stain and culture.  Successful fluoroscopic guided lumbar puncture.   Electronically Signed   By: Markus Daft M.D.   On: 03/01/2014 14:53   Ir Removal Tun  Access W/ Port W/o Fl Mod Sed  02/27/2014   CLINICAL DATA:  HIV, bacteremia, right port catheter  EXAM: RIGHT PORT CATHETER REMOVAL  Date:  8/7/20158/01/2014 2:50 pm  Radiologist:  M. Daryll Brod, MD  MEDICATIONS AND MEDICAL HISTORY: 1 mg Versed, 50 mcg fentanyl  COMPLICATIONS: No immediate  PROCEDURE: Informed consent was obtained from the patient following explanation of the procedure, risks, benefits and alternatives. The patient understands, agrees and consents for the procedure. All questions were addressed. A time out was performed.  Maximal barrier sterile technique utilized including caps, mask, sterile gowns, sterile gloves, large sterile drape, hand hygiene, and ).  Under sterile conditions and local anesthesia, an incision was made along the existing port catheter scar. Blunt and sharp dissection utilized to remove the port catheter entirely. Hemostasis obtained. The subcutaneous pocket was flushed with saline. Because of the ongoing bacteremia, the port catheter pocket was left open and packed with a wet-to-dry dressing. No immediate complication. The patient tolerated the procedure well.  IMPRESSION: Successful right IJ port catheter removal.   Electronically Signed   By: Daryll Brod M.D.   On: 02/27/2014 14:56   Ir US Guide Bx Asp/drain  03/01/2014   CLINICAL DATA:  33 year old with HIV, lymphoma and right hydronephrosis due to a right ureter stone. 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  EXAM: FLUOROSCOPIC GUIDED LUMBAR PUNCTURE  ULTRASOUND AND FLUOROSCOPIC GUIDED PLACEMENT OF RIGHT NEPHROSTOMY TUBE.  Physician: Stephan Minister. Henn, MD  FLUOROSCOPY TIME:  3 min and 36 seconds  MEDICATIONS: 100 mcg fentanyl, 2 mg versed. A radiology nurse monitored the patient for moderate sedation.  ANESTHESIA/SEDATION: Moderate sedation time: 22 min  PROCEDURE: The procedures were explained to the patient. The risks and benefits of the procedure were discussed  and the patient's questions  were addressed. Informed consent was obtained from the patient.  The patient was placed prone on the interventional table. The skin was marked at the level of L4-L5. The back and right flank were prepped and draped in sterile fashion. Maximal barrier sterile technique was utilized including caps, mask, sterile gowns, sterile gloves, sterile drape, hand hygiene and skin antiseptic. 1% lidocaine was used for local anesthetic. A 20 gauge spinal needle was directed into the thecal sac along the right side of L4-L5. Clear CSF was identified. The opening pressure was roughly 20 cm. Approximately 10 mL of CSF was removed and sent for analysis. The stylet was placed back in the needle and the needle was removed without complication. Bandage was placed over the lumbar puncture site.  Attention was directed to the right kidney. Ultrasound demonstrated moderate right hydronephrosis. 1% lidocaine was used for local anesthetic in the right flank. A 21 gauge needle was directed into a dilated lower pole calyx with ultrasound guidance. Needle position was confirmed within the collecting system. A 0.018 wire was advanced to the collecting system and an Accustick dilator set was placed. Cloudy urine was aspirated from the catheter. A Bentson wire was advanced into the proximal ureter. The tract was dilated and a 10.2 [place] multipurpose drain was advanced over the wire. Tube was reconstituted in the renal pelvis. Additional cloudy fluid was removed. Urine was sent for culture. Catheter was sutured to the skin and attached to gravity bag. Fluoroscopic and ultrasound images were taken and saved for documentation.  FINDINGS: Lumbar puncture: 10 mL of clear CSF was removed. Opening pressure was approximately 20 cm.  Nephrostomy tube: Moderate right hydronephrosis. Cloudy urine was removed from the right kidney. Nephrostomy tube is reconstituted in the renal pelvis.  COMPLICATIONS: None  IMPRESSION: Successful placement of a right  percutaneous nephrostomy tube with ultrasound and fluoroscopic guidance. Fluid was sent for Gram stain and culture.  Successful fluoroscopic guided lumbar puncture.   Electronically Signed   By: Markus Daft M.D.   On: 03/01/2014 14:53   Ir Fluoro Guide Ndl Plmt / Bx  03/01/2014   CLINICAL DATA:  33 year old with HIV, lymphoma and right hydronephrosis due to a right ureter stone. 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  EXAM: FLUOROSCOPIC GUIDED LUMBAR PUNCTURE  ULTRASOUND AND FLUOROSCOPIC GUIDED PLACEMENT OF RIGHT NEPHROSTOMY TUBE.  Physician: Stephan Minister. Henn, MD  FLUOROSCOPY TIME:  3 min and 36 seconds  MEDICATIONS: 100 mcg fentanyl, 2 mg versed. A radiology nurse monitored the patient for moderate sedation.  ANESTHESIA/SEDATION: Moderate sedation time: 22 min  PROCEDURE: The procedures were explained to the patient. The risks and benefits of the procedure were discussed and the patient's questions were addressed. Informed consent was obtained from the patient.  The patient was placed prone on the interventional table. The skin was marked at the level of L4-L5. The back and right flank were prepped and draped in sterile fashion. Maximal barrier sterile technique was utilized including caps, mask, sterile gowns, sterile gloves, sterile drape, hand hygiene and skin antiseptic. 1% lidocaine was used for local anesthetic. A 20 gauge spinal needle was directed into the thecal sac along the right side of L4-L5. Clear CSF was identified. The opening pressure was roughly 20 cm. Approximately 10 mL of CSF was removed and sent for analysis. The stylet was placed back in the needle and the needle was removed without complication. Bandage was placed over the lumbar puncture site.  Attention was directed to the right kidney. Ultrasound demonstrated moderate right hydronephrosis. 1% lidocaine was used for local anesthetic in the right flank. A 21 gauge needle was directed into a dilated lower pole  calyx with ultrasound guidance. Needle position was confirmed within the collecting system. A 0.018 wire was advanced to the collecting system and an Accustick dilator set was placed. Cloudy urine was aspirated from the catheter. A Bentson wire was advanced into the proximal ureter. The tract was dilated and a 10.2 [place] multipurpose drain was advanced over the wire. Tube was reconstituted in the renal pelvis. Additional cloudy fluid was removed. Urine was sent for culture. Catheter was sutured to the skin and attached to gravity bag. Fluoroscopic and ultrasound images were taken and saved for documentation.  FINDINGS: Lumbar puncture: 10 mL of clear CSF was removed. Opening pressure was approximately 20 cm.  Nephrostomy tube: Moderate right hydronephrosis. Cloudy urine was removed from the right kidney. Nephrostomy tube is reconstituted in the renal pelvis.  COMPLICATIONS: None  IMPRESSION: Successful placement of a right percutaneous nephrostomy tube with ultrasound and fluoroscopic guidance. Fluid was sent for Gram stain and culture.  Successful fluoroscopic guided lumbar puncture.   Electronically Signed   By: Markus Daft M.D.   On: 03/01/2014 14:53   Dg Chest Port 1 View  02/23/2014   CLINICAL DATA:  Possible sepsis  EXAM: PORTABLE CHEST - 1 VIEW  COMPARISON:  01/19/2014  FINDINGS: The lungs are now clear. Negative for pneumonia or effusion. Heart size and vascularity are normal. Port-A-Cath tip in the SVC.  IMPRESSION: No active disease.   Electronically Signed   By: Franchot Gallo M.D.   On: 02/23/2014 18:17    Microbiology: Recent Results (from the past 240 hour(s))  CULTURE, BLOOD (ROUTINE X 2)     Status: None   Collection Time    02/23/14  6:08 PM      Result Value Ref Range Status   Specimen Description BLOOD RIGHT ARM   Final   Special Requests BOTTLES DRAWN AEROBIC AND ANAEROBIC 10CC   Final   Culture  Setup Time     Final   Value: 02/23/2014 22:16     Performed at [facility]    Culture     Final   Value: STAPHYLOCOCCUS AUREUS     Note: RIFAMPIN AND GENTAMICIN SHOULD NOT BE USED AS SINGLE DRUGS FOR TREATMENT OF STAPH INFECTIONS. This organism is presumed to be Clindamycin resistant based on detection of inducible Clindamycin resistance.     Note: Gram Stain Report Called to,Read Back By and Verified With: ANDY BRAKE@3 :00PM ON 02/24/14 BY DANTS     Performed at [facility]   Report Status 02/26/2014 FINAL   Final   Organism ID, Bacteria STAPHYLOCOCCUS AUREUS   Final  CULTURE, BLOOD (ROUTINE X 2)     Status: None   Collection Time    02/23/14  6:30 PM      Result Value Ref Range Status   Specimen Description BLOOD LEFT ARM   Final   Special Requests BOTTLES DRAWN AEROBIC AND ANAEROBIC 10CC   Final   Culture  Setup Time     Final   Value: 02/23/2014 22:16     Performed at [facility]   Culture     Final   Value: STAPHYLOCOCCUS AUREUS     Note: SUSCEPTIBILITIES PERFORMED ON PREVIOUS CULTURE WITHIN THE LAST 5 DAYS.     Note: Gram Stain Report Called to,Read Back  By and Verified With: TY HICKS ON 02/24/2014 AT 10:44P BY Dennard Nip     Performed at [facility]   Report Status 02/26/2014 FINAL   Final  URINE CULTURE     Status: None   Collection Time    02/23/14  9:46 PM      Result Value Ref Range Status   Specimen Description URINE, CLEAN CATCH   Final   Special Requests NONE   Final   Culture  Setup Time     Final   Value: 02/24/2014 04:09     Performed at [facility]     Final   Value: NO GROWTH     Performed at [facility]   Culture     Final   Value: NO GROWTH     Performed at [facility]   Report Status 02/25/2014 FINAL   Final  GC/CHLAMYDIA PROBE AMP     Status: None   Collection Time    02/23/14  9:46 PM      Result Value Ref Range Status   CT Probe RNA NEGATIVE  NEGATIVE Final   GC Probe RNA NEGATIVE  NEGATIVE Final   Comment: (NOTE)                                                                                                **Normal Reference Range: Negative**          Assay performed using the Gen-Probe APTIMA COMBO2 (R) Assay.     Acceptable specimen types for this assay include APTIMA Swabs (Unisex,     endocervical, urethral, or vaginal), first void urine, and ThinPrep     liquid based cytology samples.     Performed at [place], BLOOD (ROUTINE X 2)     Status: None   Collection Time    02/25/14 12:20 PM      Result Value Ref Range Status   Specimen Description BLOOD LEFT ARM   Final   Special Requests BOTTLES DRAWN AEROBIC AND ANAEROBIC 10CC   Final   Culture  Setup Time     Final   Value: 02/25/2014 17:15     Performed at [facility]   Culture     Final   Value: NO GROWTH 5 DAYS     Performed at [facility]   Report Status 03/03/2014 FINAL   Final  CULTURE, BLOOD (ROUTINE X 2)     Status: None   Collection Time    02/25/14 12:40 PM      Result Value Ref Range Status   Specimen Description BLOOD LEFT ARM   Final   Special Requests BOTTLES DRAWN AEROBIC ONLY 10CC   Final   Culture  Setup Time     Final   Value: 02/25/2014 17:15     Performed at [facility]   Culture     Final   Value: DIPHTHEROIDS(CORYNEBACTERIUM SPECIES)     Note: Standardized susceptibility testing for this organism is not available.     Note: Gram Stain Report Called to,Read  Back By and Verified With:  BECKA QIU;0143;02/28/14;SMIAS PREVIOUSLY REPORTED AS GRAM NEGATIVE RODS CORRECTED RESULTS CALLED TO: LAURA JAMA 03/01/14 @ 11:32AM BY RUSCOE A.     Performed at [facility]   Report Status 03/01/2014 FINAL   Final  CULTURE, BLOOD (ROUTINE X 2)     Status: None   Collection Time    02/28/14  8:30 PM      Result Value Ref Range Status   Specimen Description BLOOD RIGHT HAND   Final   Special Requests BOTTLES DRAWN AEROBIC ONLY 10CC   Final   Culture  Setup Time     Final   Value: 03/01/2014 03:12     Performed at [facility]   Culture     Final   Value:        BLOOD CULTURE RECEIVED NO GROWTH TO DATE CULTURE WILL BE HELD FOR 5 DAYS BEFORE ISSUING A FINAL NEGATIVE REPORT     Performed at [facility]   Report Status PENDING   Incomplete  CULTURE, BLOOD (ROUTINE X 2)     Status: None   Collection Time    02/28/14  8:31 PM      Result Value Ref Range Status   Specimen Description BLOOD LEFT ANTECUBITAL   Final   Special Requests BOTTLES DRAWN AEROBIC AND ANAEROBIC 10CC EA   Final   Culture  Setup Time     Final   Value: 03/01/2014 03:12     Performed at [facility]   Culture     Final   Value:        BLOOD CULTURE RECEIVED NO GROWTH TO DATE CULTURE WILL BE HELD FOR 5 DAYS BEFORE ISSUING A FINAL NEGATIVE REPORT     Performed at [facility]   Report Status PENDING   Incomplete  CSF CULTURE     Status: None   Collection Time    03/01/14  2:25 PM      Result Value Ref Range Status   Specimen Description CSF   Final   Special Requests NONE   Final   Gram Stain     Final   Value: CYTOSPIN WBC PRESENT,BOTH PMN AND MONONUCLEAR     NO ORGANISMS SEEN     Performed at [facility]     Performed at [facility]   Culture     Final   Value: NO GROWTH 1 DAY     Performed at [facility]   Report Status PENDING   Incomplete  GRAM STAIN     Status: None   Collection Time    03/01/14  2:25 PM      Result Value Ref Range Status   Specimen Description CSF   Final   Special Requests NONE   Final   Gram Stain     Final   Value: CYTOSPIN SLIDE     WBC PRESENT,BOTH PMN AND MONONUCLEAR     NO ORGANISMS SEEN   Report Status 03/01/2014 FINAL   Final  BODY FLUID CULTURE     Status: None   Collection Time    03/01/14  2:31 PM      Result Value Ref Range Status   Specimen Description FLUID RIGHT KIDNEY   Final   Special Requests URINE FROM RIGHT NEPHROSTOMY TUBE   Final   Gram Stain     Final   Value: ABUNDANT WBC PRESENT,BOTH PMN AND MONONUCLEAR     NO  SQUAMOUS EPITHELIAL CELLS SEEN     NO ORGANISMS SEEN     Performed at [facility]   Culture     Final   Value: NO GROWTH 1 DAY     Performed at [facility]   Report Status PENDING   Incomplete     Labs: Basic Metabolic Panel:  Recent Labs Lab 02/25/14 0540 02/26/14 0530 02/27/14 0700 03/02/14 0430 03/03/14 0310  NA 140 140 140 138 138  K 3.4* 3.4* 4.3 3.6* 3.8  CL 113* 104 106 105 105  CO2 15* 25 21 21 21   GLUCOSE 92 90 88 86 80  BUN 22 13 12 15 12   CREATININE 2.23* 1.72* 1.64* 1.49* 1.40*  CALCIUM 8.0* 8.3* 8.3* 8.3* 8.5   Liver Function Tests: No results found for this basename: AST, ALT, ALKPHOS, BILITOT, PROT, ALBUMIN,  in the last 168 hours No results found for this basename: LIPASE, AMYLASE,  in the last 168 hours No results found for this basename: AMMONIA,  in the last 168 hours CBC:  Recent Labs Lab 02/25/14 0540 02/27/14 0700 03/02/14 0430  WBC 4.5 8.0 6.5  HGB 7.8* 8.0* 8.0*  HCT 23.2* 23.4* 24.1*  MCV 100.0 99.6 100.0  PLT 92* 158 252   Cardiac Enzymes:  Recent Labs Lab 02/24/14 1715  CKTOTAL 74   BNP: BNP (last 3 results) No results found for this basename: PROBNP,  in the last 8760 hours CBG: No results found for this basename: GLUCAP,  in the last 168 hours    Signed:  Vernell Leep, MD, FACP, FHM. [facility] Pager [phone]  If 7PM-7AM, please contact night-coverage [email or web address] Password [facility] 03/03/2014, 3:48 PM

## 2014-03-03 NOTE — Progress Notes (Signed)
IP PROGRESS NOTE  Subjective:   Events noted over the last few days. His case was discussed with Dr. Algis Liming.  He feels well and able to ambulate with the help of a cane. He reports some weakness in his left LE.  No fevers or chills.   Objective:  Vital signs in last 24 hours: Temp:  [98.1 F (36.7 C)-98.9 F (37.2 C)] 98.1 F (36.7 C) (08/11 0814) Pulse Rate:  [79-90] 88 (08/11 0817) Resp:  [16-18] 18 (08/11 0814) BP: (76-99)/(54-63) 76/55 mmHg (08/11 0817) SpO2:  [99 %-100 %] 100 % (08/11 0817) Weight:  [132 lb 11.2 oz (60.192 kg)] 132 lb 11.2 oz (60.192 kg) (08/10 2054) Weight change: 0 lb (0 kg) Last BM Date: 02/28/14  Intake/Output from previous day: 08/10 0701 - 08/11 0700 In: -  Out: 3200 [Urine:3200]  Mouth: mucous membranes moist, pharynx normal without lesions Resp: clear to auscultation bilaterally Cardio: regular rate and rhythm, S1, S2 normal, no murmur, click, rub or gallop GI: soft, non-tender; bowel sounds normal; no masses,  no organomegaly Extremities: extremities normal, atraumatic, no cyanosis or edema No focal deficits noted.    PICC-without erythema  Lab Results:  Recent Labs  03/02/14 0430  WBC 6.5  HGB 8.0*  HCT 24.1*  PLT 252    BMET  Recent Labs  03/02/14 0430 03/03/14 0310  NA 138 138  K 3.6* 3.8  CL 105 105  CO2 21 21  GLUCOSE 86 80  BUN 15 12  CREATININE 1.49* 1.40*  CALCIUM 8.3* 8.5   CSF cytology is negative.   Studies/Results: Mr Kizzie Fantasia Contrast  03/02/2014   CLINICAL DATA:  Lymphoma. Question septic emboli to the brain. Abnormal MRI of the thoracic spine with evidence of leptomeningeal enhancement in cord enhancement.  EXAM: MRI HEAD WITHOUT AND WITH CONTRAST  TECHNIQUE: Multiplanar, multiecho pulse sequences of the brain and surrounding structures were obtained without and with intravenous contrast.  CONTRAST:  91mL MULTIHANCE GADOBENATE DIMEGLUMINE 529 MG/ML IV SOLN  COMPARISON:  MRI of the thoracic spine and  lumbar spine with contrast 02/28/2014  FINDINGS: Mild diffuse leptomeningeal enhancement is evident. No focal dural or parenchymal enhancement is present.  No hemorrhage or mass lesion is present. There is no acute infarct. No significant white matter disease is evident.  The ventricles are of normal size. No significant extraaxial fluid collection is present.  Flow is present in the major intracranial arteries. The globes and orbits are intact. The paranasal sinuses and mastoid air cells are clear.  IMPRESSION: 1. 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 This could however, be result of the recent lumbar puncture. 2. No parenchymal or dural enhancement. 3. Otherwise normal MRI of the brain.   Electronically Signed   By: Lawrence Santiago M.D.   On: 03/02/2014 12:49    Medications: I have reviewed the patient's current medications.  Assessment/Plan:  33 year old gentleman with the following issues:  1. Hodgkin's disease: His treatment has been on hold and will resume in the near future upon his recovery from his recent sepsis episode.  2. MSSA bacteremia: His Port-A-Cath has been removed and he will complete about 3 weeks of cefazolin.  3. Acute renal failure: Likely related to hydronephrosis and seems to be improving at this time.  4. Lower extremity weakness and MRI findings of possible meningeal enhancement. The differential diagnosis include malignant involvement versus infectious etiology. His CSF cytology has been negative but does not rule out completely malignant involvement. I agree  with neurology's assessment about this being possibly an infectious process. However, If his LP is to be repeated, I recommend sending the fluid for cytology and flow cytometry for the evaluation of lymphoma.  He is ready for discharge soon and I will arrange a followup at the [facility] as well.   LOS: 8 days   SHADAD,FIRAS 03/03/2014, 3:16 PM

## 2014-03-03 NOTE — Progress Notes (Signed)
[facility]    Date of Admission:  02/23/2014    Total days of antibiotics 8        Day 7 Ancef         Principal Problem:   Staphylococcus aureus bacteremia Active Problems:   HIV disease   Hodgkin's disease   Hypokalemia   Thrombocytopenia   Acute renal failure   Shingles rash   Metabolic acidosis, normal anion gap (NAG)   CLABSI (central line-associated bloodstream infection)   Numbness of left lower extremity   Hydronephrosis, right & hydroureter: secondary to obstructing ureteral calculi   . calcium carbonate  800 mg of elemental calcium Oral TID  .  ceFAZolin (ANCEF) IV  2 g Intravenous 3 times per day  . clotrimazole-betamethasone   Topical BID  . feeding supplement (ENSURE COMPLETE)  237 mL Oral TID BM  . ferrous sulfate  325 mg Oral BID WC    Subjective: He is feeling better. He states that he has been up walking in the hallway and that the numbness in his left leg has improved.    Past Medical History  Diagnosis Date  . HIV disease 06/25/2012  . Skin lesion 07/03/2012  . Leg pain 07/03/2012  . Sinus tachycardia 08/06/2012  . Penile cyst 08/06/2012  . Cancer     History  Substance Use Topics  . Smoking status: Former Smoker -- 1.00 packs/day for 15 years    Types: Cigarettes    Start date: 08/24/2013  . Smokeless tobacco: Never Used     Comment: quitline info, counseling  . Alcohol Use: No    History reviewed. No pertinent family history.  Allergies  Allergen Reactions  . Tylenol [Acetaminophen]     sweats    Objective: Temp:  [98.1 F (36.7 C)-98.9 F (37.2 C)] 98.1 F (36.7 C) (08/11 0814) Pulse Rate:  [79-90] 88 (08/11 0817) Resp:  [16-18] 18 (08/11 0814) BP: (76-99)/(54-63) 76/55 mmHg (08/11 0817) SpO2:  [99 %-100 %] 100 % (08/11 0817) Weight:  [132 lb 11.2 oz (60.192 kg)] 132 lb 11.2 oz (60.192 kg) (08/10 2054)  General: Pleasant 33 y/o male, alert, cooperative, no acute distress.  HEENT: PERRL, EOMI.  Moist mucus membranes Neck: Full range of motion without pain, supple, no lymphadenopathy or carotid bruits Lungs: Clear to ascultation bilaterally, no wheezes, rales, rhonchi Heart: Regular rate and rhythm, no murmurs.  Chest: Right upper chest s/p port-a-cath removal. Gauze appears saturated w/ sero-sanguinous drainage. Tender to palpation. Abdomen: Soft, non-tender, non-distended, bowel sounds present.  Extremities: No cyanosis, clubbing, or edema. Excoriated healing rash over left posterior thigh, groin and buttock. Neurologic: Alert & oriented X3, cranial nerves II-XII intact, strength grossly intact  Lab Results Lab Results  Component Value Date   WBC 6.5 03/02/2014   HGB 8.0* 03/02/2014   HCT 24.1* 03/02/2014   MCV 100.0 03/02/2014   PLT 252 03/02/2014    Lab Results  Component Value Date   CREATININE 1.40* 03/03/2014   BUN 12 03/03/2014   NA 138 03/03/2014   K 3.8 03/03/2014   CL 105 03/03/2014   CO2 21 03/03/2014    Lab Results  Component Value Date   ALT 25 02/23/2014   AST 18 02/23/2014   ALKPHOS 183* 02/23/2014   BILITOT 0.3 02/23/2014      Microbiology: Recent Results (from the past 240 hour(s))  CULTURE, BLOOD (ROUTINE X 2)     Status: None   Collection Time  02/23/14  6:08 PM      Result Value Ref Range Status   Specimen Description BLOOD RIGHT ARM   Final   Special Requests BOTTLES DRAWN AEROBIC AND ANAEROBIC 10CC   Final   Culture  Setup Time     Final   Value: 02/23/2014 22:16     Performed at [facility]   Culture     Final   Value: STAPHYLOCOCCUS AUREUS     Note: RIFAMPIN AND GENTAMICIN SHOULD NOT BE USED AS SINGLE DRUGS FOR TREATMENT OF STAPH INFECTIONS. This organism is presumed to be Clindamycin resistant based on detection of inducible Clindamycin resistance.     Note: Gram Stain Report Called to,Read Back By and Verified With: ANDY BRAKE@3 :00PM ON 02/24/14 BY DANTS     Performed at [facility]   Report Status 02/26/2014 FINAL   Final    Organism ID, Bacteria STAPHYLOCOCCUS AUREUS   Final  CULTURE, BLOOD (ROUTINE X 2)     Status: None   Collection Time    02/23/14  6:30 PM      Result Value Ref Range Status   Specimen Description BLOOD LEFT ARM   Final   Special Requests BOTTLES DRAWN AEROBIC AND ANAEROBIC 10CC   Final   Culture  Setup Time     Final   Value: 02/23/2014 22:16     Performed at [facility]   Culture     Final   Value: STAPHYLOCOCCUS AUREUS     Note: SUSCEPTIBILITIES PERFORMED ON PREVIOUS CULTURE WITHIN THE LAST 5 DAYS.     Note: Gram Stain Report Called to,Read Back By and Verified With: TY HICKS ON 02/24/2014 AT 10:44P BY WILEJ     Performed at [facility]   Report Status 02/26/2014 FINAL   Final  URINE CULTURE     Status: None   Collection Time    02/23/14  9:46 PM      Result Value Ref Range Status   Specimen Description URINE, CLEAN CATCH   Final   Special Requests NONE   Final   Culture  Setup Time     Final   Value: 02/24/2014 04:09     Performed at [facility]     Final   Value: NO GROWTH     Performed at [facility]   Culture     Final   Value: NO GROWTH     Performed at [facility]   Report Status 02/25/2014 FINAL   Final  GC/CHLAMYDIA PROBE AMP     Status: None   Collection Time    02/23/14  9:46 PM      Result Value Ref Range Status   CT Probe RNA NEGATIVE  NEGATIVE Final   GC Probe RNA NEGATIVE  NEGATIVE Final   Comment: (NOTE)                                                                                               **Normal Reference Range: Negative**          Assay performed using  the Gen-Probe APTIMA COMBO2 (R) Assay.     Acceptable specimen types for this assay include APTIMA Swabs (Unisex,     endocervical, urethral, or vaginal), first void urine, and ThinPrep     liquid based cytology samples.     Performed at [facility], BLOOD (ROUTINE X 2)     Status: None   Collection Time    02/25/14  12:20 PM      Result Value Ref Range Status   Specimen Description BLOOD LEFT ARM   Final   Special Requests BOTTLES DRAWN AEROBIC AND ANAEROBIC 10CC   Final   Culture  Setup Time     Final   Value: 02/25/2014 17:15     Performed at [facility]   Culture     Final   Value: NO GROWTH 5 DAYS     Performed at [facility]   Report Status 03/03/2014 FINAL   Final  CULTURE, BLOOD (ROUTINE X 2)     Status: None   Collection Time    02/25/14 12:40 PM      Result Value Ref Range Status   Specimen Description BLOOD LEFT ARM   Final   Special Requests BOTTLES DRAWN AEROBIC ONLY 10CC   Final   Culture  Setup Time     Final   Value: 02/25/2014 17:15     Performed at [facility]   Culture     Final   Value: DIPHTHEROIDS(CORYNEBACTERIUM SPECIES)     Note: Standardized susceptibility testing for this organism is not available.     Note: Gram Stain Report Called to,Read Back By and Verified With:  BECKA QIU;0143;02/28/14;SMIAS PREVIOUSLY REPORTED AS GRAM NEGATIVE RODS CORRECTED RESULTS CALLED TO: LAURA JAMA 03/01/14 @ 11:32AM BY RUSCOE A.     Performed at [facility]   Report Status 03/01/2014 FINAL   Final  CULTURE, BLOOD (ROUTINE X 2)     Status: None   Collection Time    02/28/14  8:30 PM      Result Value Ref Range Status   Specimen Description BLOOD RIGHT HAND   Final   Special Requests BOTTLES DRAWN AEROBIC ONLY 10CC   Final   Culture  Setup Time     Final   Value: 03/01/2014 03:12     Performed at [facility]   Culture     Final   Value:        BLOOD CULTURE RECEIVED NO GROWTH TO DATE CULTURE WILL BE HELD FOR 5 DAYS BEFORE ISSUING A FINAL NEGATIVE REPORT     Performed at [facility]   Report Status PENDING   Incomplete  CULTURE, BLOOD (ROUTINE X 2)     Status: None   Collection Time    02/28/14  8:31 PM      Result Value Ref Range Status   Specimen Description BLOOD LEFT ANTECUBITAL   Final   Special Requests BOTTLES DRAWN AEROBIC  AND ANAEROBIC 10CC EA   Final   Culture  Setup Time     Final   Value: 03/01/2014 03:12     Performed at [facility]   Culture     Final   Value:        BLOOD CULTURE RECEIVED NO GROWTH TO DATE CULTURE WILL BE HELD FOR 5 DAYS BEFORE ISSUING A FINAL NEGATIVE REPORT     Performed at [facility]   Report Status PENDING   Incomplete  CSF CULTURE  Status: None   Collection Time    03/01/14  2:25 PM      Result Value Ref Range Status   Specimen Description CSF   Final   Special Requests NONE   Final   Gram Stain     Final   Value: CYTOSPIN WBC PRESENT,BOTH PMN AND MONONUCLEAR     NO ORGANISMS SEEN     Performed at [facility]     Performed at [facility]   Culture     Final   Value: NO GROWTH     Performed at [facility]   Report Status PENDING   Incomplete  GRAM STAIN     Status: None   Collection Time    03/01/14  2:25 PM      Result Value Ref Range Status   Specimen Description CSF   Final   Special Requests NONE   Final   Gram Stain     Final   Value: CYTOSPIN SLIDE     WBC PRESENT,BOTH PMN AND MONONUCLEAR     NO ORGANISMS SEEN   Report Status 03/01/2014 FINAL   Final  BODY FLUID CULTURE     Status: None   Collection Time    03/01/14  2:31 PM      Result Value Ref Range Status   Specimen Description FLUID RIGHT KIDNEY   Final   Special Requests URINE FROM RIGHT NEPHROSTOMY TUBE   Final   Gram Stain     Final   Value: ABUNDANT WBC PRESENT,BOTH PMN AND MONONUCLEAR     NO SQUAMOUS EPITHELIAL CELLS SEEN     NO ORGANISMS SEEN     Performed at [facility]   Culture     Final   Value: NO GROWTH     Performed at [facility]   Report Status PENDING   Incomplete    Assessment: 33 y/o M w/ PMHx of HIV and Hodgkin's Lymphoma receiving chemotherapy, admitted for AKI and hypotension, found to have healing shingles rash on posterior left thigh and MSSA bacteremia. On 02/27/14, TEE showed thickened catheter on  port-a-cath, requiring removal. 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 MRI also showed severe right-sided hydronephrosis due to an obstructing stone and nephrostomy was performed on 03/01/14. CSF was also obtained which was not suggestive of a bacterial meningitis, cultures negative to date. This LP was only 1 mL in volume. After discussion w/ Dr. Algis Liming and after review of neuro recommendations, varicella PCR is to return by 8/17. If PCR is shown to be negative, consider repeat MRI spine to assess whether enhancement still exists. If this is the case, patient should undergo larger volume LP for further cytology/malignancy workup. PICC line placed today for continued Cefazolin course.   Plan: 1. Continue Cefazolin for total of 3 week course (end date 03/16/14) 2. We will follow VZV PCR with you on discharge 3. Start Triumeq once daily for HIV in place of Atripla 4. Follow up in ID clinic w/ Dr. Linus Salmons on 03/26/14 @ 3:15 PM  Luanne Bras, MD PGY-2, Internal Medicine Pager: [phone] 03/03/2014, 8:40 AM  Attending addendum: Thomas [name] is feeling better but is overwhelmed by all that has happened to him recently. I will treat him with 3 weeks of cefazolin for his MSSA bacteremia. His Port-A-Cath has been removed. He had no evidence of native valve endocarditis. His renal function has improved. I will switch him  to Triumeq so he will not be on tenofovir anymore.  Michel Bickers, MD [facility] for [facility] [phone] pager   [phone] cell 03/03/2014, 2:54 PM

## 2014-03-03 NOTE — Progress Notes (Signed)
Physical Therapy Treatment Patient Details Name: Thomas [name] MRN: [id number] DOB: 12/13/1980 Today's Date: March 16, 2014    History of Present Illness Pt admitted with BLE numbness, bacteremia, Hodgkin's Disease, shingles rash, and acute renal failure.    PT Comments    Today's session focused on increasing pt's stability. He walked with a single point cane for steadiness. Pt also worked on Single Limb Stance to improve balance and ankle strength. Pt is progressing towards goals and plan to d/c home with HHPT and HHaide with a single point cane remains appropriate.  Follow Up Recommendations  Home health PT;Other [facility])     Equipment Recommendations  Cane    Recommendations for Other Services       Precautions / Restrictions Precautions Precautions: None Restrictions Weight Bearing Restrictions: No    Mobility  Bed Mobility Overal bed mobility: Independent                Transfers Overall transfer level: Independent Equipment used: None                Ambulation/Gait Ambulation/Gait assistance: Modified independent (Device/Increase time);Supervision Ambulation Distance (Feet): 265 Feet Assistive device: Straight cane Gait Pattern/deviations: Step-through pattern;Decreased stride length     General Gait Details: Pt was unsteady with ambulation. The use of the cane helped and pt stated he felt more steady with the cane, though he reached for rails and the sink still, "out of habit" he said.   Stairs            Wheelchair Mobility    Modified Rankin (Stroke Patients Only)       Balance Overall balance assessment: Modified Independent (used handheld support intermittantly )                                  Cognition Arousal/Alertness: Awake/alert Behavior During Therapy: WFL for tasks assessed/performed Overall Cognitive Status: Within Functional Limits for tasks assessed                      Exercises General  Exercises - Lower Extremity Other (comment) (single leg stance for balance and ankle strengthening, 4x both sides, 5 sec hold)    General Comments        Pertinent Vitals/Pain Pain Assessment: no apparent distress     Home Living                      Prior Function            PT Goals (current goals can now be found in the care plan section) Acute Rehab PT Goals Patient Stated Goal: To keep walking, even if just around apartment complex PT Goal Formulation: With patient Potential to Achieve Goals: Good Progress towards PT goals: Progressing toward goals    Frequency  Min 3X/week    PT Plan Current plan remains appropriate    Co-evaluation             End of Session   Activity Tolerance: Patient tolerated treatment well Patient left: in chair;with call bell/phone within reach     Time: 6659-9357 PT Time Calculation (min): 16 min  Charges:                       G CodesShirleen Schirmer Mar 16, 2014, 11:53 AM  Shirleen Schirmer, [place] Office: [phone]  [place], [place]  Acute Rehabilitation Services Pager [phone] Office [phone]

## 2014-03-03 NOTE — Progress Notes (Signed)
4 Days Post-Op  Subjective: R PCN placed 8/9 Output has been good Blood tinged today No complaints Up in chair   Objective: Vital signs in last 24 hours: Temp:  [98.1 F (36.7 C)-98.9 F (37.2 C)] 98.1 F (36.7 C) (08/11 0814) Pulse Rate:  [79-90] 88 (08/11 0817) Resp:  [16-18] 18 (08/11 0814) BP: (76-99)/(54-63) 76/55 mmHg (08/11 0817) SpO2:  [99 %-100 %] 100 % (08/11 0817) Weight:  [60.192 kg (132 lb 11.2 oz)] 60.192 kg (132 lb 11.2 oz) (08/10 2054) Last BM Date: 02/28/14  Intake/Output from previous day: 08/10 0701 - 08/11 0700 In: -  Out: 3200 [Urine:3200] Intake/Output this shift: Total I/O In: 495 [P.O.:220; Other:275] Out: -   PE:  Site clean and dry NT; no bleeding output blood tinged in bag 1.3 liters yesterday afeb Bun/cr: 12/1.4 (15/1.49)   Lab Results:   Recent Labs  03/02/14 0430  WBC 6.5  HGB 8.0*  HCT 24.1*  PLT 252   BMET  Recent Labs  03/02/14 0430 03/03/14 0310  NA 138 138  K 3.6* 3.8  CL 105 105  CO2 21 21  GLUCOSE 86 80  BUN 15 12  CREATININE 1.49* 1.40*  CALCIUM 8.3* 8.5   PT/INR  Recent Labs  03/01/14 1143  LABPROT 14.8  INR 1.16   ABG No results found for this basename: PHART, PCO2, PO2, HCO3,  in the last 72 hours  Studies/Results: Ct Head Wo Contrast  03/01/2014   CLINICAL DATA:  33 year old male with left lower extremity weakness. 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 Initial encounter.  EXAM: CT HEAD WITHOUT CONTRAST  TECHNIQUE: Contiguous axial images were obtained from the base of the skull through the vertex without intravenous contrast.  COMPARISON:  Head CT without contrast 08/02/2013. Thoracic spine MRI 02/28/2014.  FINDINGS: Visualized paranasal sinuses and mastoids are clear. No osseous abnormality identified. Visualized orbits and scalp soft tissues are within normal limits.  Stable cerebral volume. No midline shift, mass effect, or evidence of intracranial mass lesion. Stable, normal ventricular  size and configuration. Stable, normal basilar cisterns. No acute intracranial hemorrhage identified. Gray-white matter differentiation is within normal limits throughout the brain. No evidence of cortically based acute infarction identified. No suspicious intracranial vascular hyperdensity.  IMPRESSION: Stable and normal noncontrast CT appearance of the brain. No contraindication to lumbar puncture identified.   Electronically Signed   By: Lars Pinks M.D.   On: 03/01/2014 11:39   Mr Jeri Cos CN Contrast  03/02/2014   CLINICAL DATA:  Lymphoma. Question septic emboli to the brain. Abnormal MRI of the thoracic spine with evidence of leptomeningeal enhancement in cord enhancement.  EXAM: MRI HEAD WITHOUT AND WITH CONTRAST  TECHNIQUE: Multiplanar, multiecho pulse sequences of the brain and surrounding structures were obtained without and with intravenous contrast.  CONTRAST:  14mL MULTIHANCE GADOBENATE DIMEGLUMINE 529 MG/ML IV SOLN  COMPARISON:  MRI of the thoracic spine and lumbar spine with contrast 02/28/2014  FINDINGS: Mild diffuse leptomeningeal enhancement is evident. No focal dural or parenchymal enhancement is present.  No hemorrhage or mass lesion is present. There is no acute infarct. No significant white matter disease is evident.  The ventricles are of normal size. No significant extraaxial fluid collection is present.  Flow is present in the major intracranial arteries. The globes and orbits are intact. The paranasal sinuses and mastoid air cells are clear.  IMPRESSION: 1. 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 This could however, be result of the recent lumbar  puncture. 2. No parenchymal or dural enhancement. 3. Otherwise normal MRI of the brain.   Electronically Signed   By: Lawrence Santiago M.D.   On: 03/02/2014 12:49   Ir Perc Nephrostomy Right  03/01/2014   CLINICAL DATA:  33 year old with HIV, lymphoma and right hydronephrosis due to a right ureter stone.  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  EXAM: FLUOROSCOPIC GUIDED LUMBAR PUNCTURE  ULTRASOUND AND FLUOROSCOPIC GUIDED PLACEMENT OF RIGHT NEPHROSTOMY TUBE.  Physician: Stephan Minister. Henn, MD  FLUOROSCOPY TIME:  3 min and 36 seconds  MEDICATIONS: 100 mcg fentanyl, 2 mg versed. A radiology nurse monitored the patient for moderate sedation.  ANESTHESIA/SEDATION: Moderate sedation time: 22 min  PROCEDURE: The procedures were explained to the patient. The risks and benefits of the procedure were discussed and the patient's questions were addressed. Informed consent was obtained from the patient.  The patient was placed prone on the interventional table. The skin was marked at the level of L4-L5. The back and right flank were prepped and draped in sterile fashion. Maximal barrier sterile technique was utilized including caps, mask, sterile gowns, sterile gloves, sterile drape, hand hygiene and skin antiseptic. 1% lidocaine was used for local anesthetic. A 20 gauge spinal needle was directed into the thecal sac along the right side of L4-L5. Clear CSF was identified. The opening pressure was roughly 20 cm. Approximately 10 mL of CSF was removed and sent for analysis. The stylet was placed back in the needle and the needle was removed without complication. Bandage was placed over the lumbar puncture site.  Attention was directed to the right kidney. Ultrasound demonstrated moderate right hydronephrosis. 1% lidocaine was used for local anesthetic in the right flank. A 21 gauge needle was directed into a dilated lower pole calyx with ultrasound guidance. Needle position was confirmed within the collecting system. A 0.018 wire was advanced to the collecting system and an Accustick dilator set was placed. Cloudy urine was aspirated from the catheter. A Bentson wire was advanced into the proximal ureter. The tract was dilated and a 10.2 [place] multipurpose drain was advanced over the wire. Tube was  reconstituted in the renal pelvis. Additional cloudy fluid was removed. Urine was sent for culture. Catheter was sutured to the skin and attached to gravity bag. Fluoroscopic and ultrasound images were taken and saved for documentation.  FINDINGS: Lumbar puncture: 10 mL of clear CSF was removed. Opening pressure was approximately 20 cm.  Nephrostomy tube: Moderate right hydronephrosis. Cloudy urine was removed from the right kidney. Nephrostomy tube is reconstituted in the renal pelvis.  COMPLICATIONS: None  IMPRESSION: Successful placement of a right percutaneous nephrostomy tube with ultrasound and fluoroscopic guidance. Fluid was sent for Gram stain and culture.  Successful fluoroscopic guided lumbar puncture.   Electronically Signed   By: Markus Daft M.D.   On: 03/01/2014 14:53   Ir US Guide Bx Asp/drain  03/01/2014   CLINICAL DATA:  33 year old with HIV, lymphoma and right hydronephrosis due to a right ureter stone. 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  EXAM: FLUOROSCOPIC GUIDED LUMBAR PUNCTURE  ULTRASOUND AND FLUOROSCOPIC GUIDED PLACEMENT OF RIGHT NEPHROSTOMY TUBE.  Physician: Stephan Minister. Henn, MD  FLUOROSCOPY TIME:  3 min and 36 seconds  MEDICATIONS: 100 mcg fentanyl, 2 mg versed. A radiology nurse monitored the patient for moderate sedation.  ANESTHESIA/SEDATION: Moderate sedation time: 22 min  PROCEDURE: The procedures were explained to the patient. The risks and benefits of the  procedure were discussed and the patient's questions were addressed. Informed consent was obtained from the patient.  The patient was placed prone on the interventional table. The skin was marked at the level of L4-L5. The back and right flank were prepped and draped in sterile fashion. Maximal barrier sterile technique was utilized including caps, mask, sterile gowns, sterile gloves, sterile drape, hand hygiene and skin antiseptic. 1% lidocaine was used for local anesthetic. A 20 gauge spinal needle was  directed into the thecal sac along the right side of L4-L5. Clear CSF was identified. The opening pressure was roughly 20 cm. Approximately 10 mL of CSF was removed and sent for analysis. The stylet was placed back in the needle and the needle was removed without complication. Bandage was placed over the lumbar puncture site.  Attention was directed to the right kidney. Ultrasound demonstrated moderate right hydronephrosis. 1% lidocaine was used for local anesthetic in the right flank. A 21 gauge needle was directed into a dilated lower pole calyx with ultrasound guidance. Needle position was confirmed within the collecting system. A 0.018 wire was advanced to the collecting system and an Accustick dilator set was placed. Cloudy urine was aspirated from the catheter. A Bentson wire was advanced into the proximal ureter. The tract was dilated and a 10.2 [place] multipurpose drain was advanced over the wire. Tube was reconstituted in the renal pelvis. Additional cloudy fluid was removed. Urine was sent for culture. Catheter was sutured to the skin and attached to gravity bag. Fluoroscopic and ultrasound images were taken and saved for documentation.  FINDINGS: Lumbar puncture: 10 mL of clear CSF was removed. Opening pressure was approximately 20 cm.  Nephrostomy tube: Moderate right hydronephrosis. Cloudy urine was removed from the right kidney. Nephrostomy tube is reconstituted in the renal pelvis.  COMPLICATIONS: None  IMPRESSION: Successful placement of a right percutaneous nephrostomy tube with ultrasound and fluoroscopic guidance. Fluid was sent for Gram stain and culture.  Successful fluoroscopic guided lumbar puncture.   Electronically Signed   By: Markus Daft M.D.   On: 03/01/2014 14:53   Ir Fluoro Guide Ndl Plmt / Bx  03/01/2014   CLINICAL DATA:  33 year old with HIV, lymphoma and right hydronephrosis due to a right ureter stone. 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  EXAM:  FLUOROSCOPIC GUIDED LUMBAR PUNCTURE  ULTRASOUND AND FLUOROSCOPIC GUIDED PLACEMENT OF RIGHT NEPHROSTOMY TUBE.  Physician: Stephan Minister. Henn, MD  FLUOROSCOPY TIME:  3 min and 36 seconds  MEDICATIONS: 100 mcg fentanyl, 2 mg versed. A radiology nurse monitored the patient for moderate sedation.  ANESTHESIA/SEDATION: Moderate sedation time: 22 min  PROCEDURE: The procedures were explained to the patient. The risks and benefits of the procedure were discussed and the patient's questions were addressed. Informed consent was obtained from the patient.  The patient was placed prone on the interventional table. The skin was marked at the level of L4-L5. The back and right flank were prepped and draped in sterile fashion. Maximal barrier sterile technique was utilized including caps, mask, sterile gowns, sterile gloves, sterile drape, hand hygiene and skin antiseptic. 1% lidocaine was used for local anesthetic. A 20 gauge spinal needle was directed into the thecal sac along the right side of L4-L5. Clear CSF was identified. The opening pressure was roughly 20 cm. Approximately 10 mL of CSF was removed and sent for analysis. The stylet was placed back in the needle and the needle was removed without complication. Bandage was placed over the lumbar  puncture site.  Attention was directed to the right kidney. Ultrasound demonstrated moderate right hydronephrosis. 1% lidocaine was used for local anesthetic in the right flank. A 21 gauge needle was directed into a dilated lower pole calyx with ultrasound guidance. Needle position was confirmed within the collecting system. A 0.018 wire was advanced to the collecting system and an Accustick dilator set was placed. Cloudy urine was aspirated from the catheter. A Bentson wire was advanced into the proximal ureter. The tract was dilated and a 10.2 [place] multipurpose drain was advanced over the wire. Tube was reconstituted in the renal pelvis. Additional cloudy fluid was removed. Urine was  sent for culture. Catheter was sutured to the skin and attached to gravity bag. Fluoroscopic and ultrasound images were taken and saved for documentation.  FINDINGS: Lumbar puncture: 10 mL of clear CSF was removed. Opening pressure was approximately 20 cm.  Nephrostomy tube: Moderate right hydronephrosis. Cloudy urine was removed from the right kidney. Nephrostomy tube is reconstituted in the renal pelvis.  COMPLICATIONS: None  IMPRESSION: Successful placement of a right percutaneous nephrostomy tube with ultrasound and fluoroscopic guidance. Fluid was sent for Gram stain and culture.  Successful fluoroscopic guided lumbar puncture.   Electronically Signed   By: Markus Daft M.D.   On: 03/01/2014 14:53    Anti-infectives: Anti-infectives   Start     Dose/Rate Route Frequency Ordered Stop   03/01/14 1200  cefTRIAXone (ROCEPHIN) 1 g in dextrose 5 % 50 mL IVPB     1 g 100 mL/hr over 30 Minutes Intravenous  Once 03/01/14 1144 03/01/14 1306   02/25/14 2200  emtricitabine-tenofovir (TRUVADA) 200-300 MG per tablet 1 tablet  Status:  Discontinued     1 tablet Oral Every 48 hours 02/24/14 1443 02/25/14 1448   02/25/14 1615  ceFAZolin (ANCEF) IVPB 2 g/50 mL premix     2 g 100 mL/hr over 30 Minutes Intravenous 3 times per day 02/25/14 1600     02/24/14 2315  vancomycin (VANCOCIN) IVPB 1000 mg/200 mL premix  Status:  Discontinued     1,000 mg 200 mL/hr over 60 Minutes Intravenous Every 24 hours 02/24/14 2313 02/26/14 0946   02/24/14 2200  efavirenz (SUSTIVA) tablet 600 mg  Status:  Discontinued     600 mg Oral Daily at bedtime 02/24/14 1443 02/25/14 1448   02/23/14 2200  efavirenz-emtricitabine-tenofovir (ATRIPLA) 600-200-300 MG per tablet 1 tablet  Status:  Discontinued     1 tablet Oral Daily at bedtime 02/23/14 2045 02/24/14 1442      Assessment/Plan: s/p   Rt hydro Rt PCN placed 8/9 Doing well Will follow Plan per admitting MD   LOS: 8 days    Neal Trulson A 03/03/2014

## 2014-03-03 NOTE — Care Management Note (Signed)
CARE MANAGEMENT NOTE 03/03/2014  Patient:  Thomas [name], Thomas [name]   Account Number:  [id number]  Date Initiated:  03/03/2014  Documentation initiated by:  Kamsiyochukwu Buist  Subjective/Objective Assessment:   CM following for progression and d/c planning.     Action/Plan:   Met with pt and call pt Mom, per pt request to arrange [facility] services. She selected [facility] for [facility], IV antibiotics, HHPT and [place] aide. She will explore PCS, application placed in pt bedside table.   Anticipated DC Date:  03/03/2014   Anticipated DC Plan:  [place]         [facility] Choice  HOME HEALTH  DURABLE MEDICAL EQUIPMENT   Choice offered to / List presented to:  [name]   DME arranged  [facility]      DME agency  [place] arranged  HH-1 RN  [place].   Status of service:  Completed, signed off Medicare Important Message given?  YES (If response is "NO", the following Medicare IM given date fields will be blank) Date Medicare IM given:  03/03/2014 Medicare IM given by:  Kalika Smay Date Additional Medicare IM given:   Additional Medicare IM given by:    Discharge Disposition:  [place]  Per UR Regulation:    If discussed at Long Length of Stay Meetings, dates discussed:    Comments:  2/97/9892 Application for [facility]) placed in pt bedside table drawer for his mother to take to PCP. This service is thru Medicaid and the form must be completed by the pt PCP. This was explained to the pt mother and she will pursue this for the pt. IM letter given. HH and DME ordered. [facility] selected and will provide [facility] services as well as IV antibiotics.   CRoyal RN MPH, case manager, [phone]

## 2014-03-03 NOTE — Progress Notes (Signed)
Peripherally Inserted Central Catheter/Midline Placement  The IV Nurse has discussed with the patient and/or persons authorized to consent for the patient, the purpose of this procedure and the potential benefits and risks involved with this procedure.  The benefits include less needle sticks, lab draws from the catheter and patient may be discharged home with the catheter.  Risks include, but not limited to, infection, bleeding, blood clot (thrombus formation), and puncture of an artery; nerve damage and irregular heat beat.  Alternatives to this procedure were also discussed.  PICC/Midline Placement Documentation        Thomas [name] 03/03/2014, 10:15 AM Also explained everything to his Mother on phone.

## 2014-03-03 NOTE — Progress Notes (Signed)
Note/chart reviewed.  Katie Thayne Cindric, RD, LDN Pager #: [phone] After-Hours Pager #: [phone]  

## 2014-03-03 NOTE — Progress Notes (Signed)
Discharge documentation reviewed with pt; allowing time for questions. Pt verbalized understanding. IV removed without issue. Educated pt on how to empty nephrostomy bag. Pt leaving with left forearm PICC for IV antibiotics. [facility] to follow pt.

## 2014-03-04 ENCOUNTER — Other Ambulatory Visit (HOSPITAL_COMMUNITY): Payer: Self-pay | Admitting: Urology

## 2014-03-04 DIAGNOSIS — N135 Crossing vessel and stricture of ureter without hydronephrosis: Secondary | ICD-10-CM

## 2014-03-04 LAB — VDRL, CSF: SYPHILIS VDRL QUANT CSF: NONREACTIVE

## 2014-03-05 LAB — CSF CULTURE W GRAM STAIN: Culture: NO GROWTH

## 2014-03-05 LAB — BODY FLUID CULTURE: Culture: NO GROWTH

## 2014-03-05 LAB — CSF CULTURE

## 2014-03-06 DIAGNOSIS — A4901 Methicillin susceptible Staphylococcus aureus infection, unspecified site: Secondary | ICD-10-CM | POA: Diagnosis not present

## 2014-03-06 DIAGNOSIS — N1330 Unspecified hydronephrosis: Secondary | ICD-10-CM | POA: Diagnosis not present

## 2014-03-06 DIAGNOSIS — T80211A Bloodstream infection due to central venous catheter, initial encounter: Secondary | ICD-10-CM | POA: Diagnosis not present

## 2014-03-06 DIAGNOSIS — B20 Human immunodeficiency virus [HIV] disease: Secondary | ICD-10-CM | POA: Diagnosis not present

## 2014-03-06 DIAGNOSIS — Z48816 Encounter for surgical aftercare following surgery on the genitourinary system: Secondary | ICD-10-CM | POA: Diagnosis not present

## 2014-03-06 DIAGNOSIS — N211 Calculus in urethra: Secondary | ICD-10-CM | POA: Diagnosis not present

## 2014-03-06 LAB — M. TUBERCULOSIS COMPLEX BY PCR: M TUBERCULOSISDIRECT: NOT DETECTED

## 2014-03-07 LAB — CULTURE, BLOOD (ROUTINE X 2)
CULTURE: NO GROWTH
Culture: NO GROWTH

## 2014-03-07 LAB — VARICELLA-ZOSTER BY PCR: VARICELLA-ZOSTER, PCR: DETECTED — AB

## 2014-03-07 LAB — OLIGOCLONAL BANDS, CSF + SERM

## 2014-03-07 NOTE — Progress Notes (Signed)
CRITICAL VALUE ALERT  Critical value received:  Varicella zoster PCR positive from spinal fluid  Date of notification:  03/07/2014  Time of notification:  3:56 PM  Critical value read back:Yes.    Nurse who received alert:  Orson Gear, RN  MD notified (1st page): pt has been discharged.

## 2014-03-09 DIAGNOSIS — Z48816 Encounter for surgical aftercare following surgery on the genitourinary system: Secondary | ICD-10-CM | POA: Diagnosis not present

## 2014-03-09 DIAGNOSIS — T80211A Bloodstream infection due to central venous catheter, initial encounter: Secondary | ICD-10-CM | POA: Diagnosis not present

## 2014-03-09 DIAGNOSIS — N1330 Unspecified hydronephrosis: Secondary | ICD-10-CM | POA: Diagnosis not present

## 2014-03-09 DIAGNOSIS — Z5181 Encounter for therapeutic drug level monitoring: Secondary | ICD-10-CM | POA: Diagnosis not present

## 2014-03-09 DIAGNOSIS — B20 Human immunodeficiency virus [HIV] disease: Secondary | ICD-10-CM | POA: Diagnosis not present

## 2014-03-09 DIAGNOSIS — N211 Calculus in urethra: Secondary | ICD-10-CM | POA: Diagnosis not present

## 2014-03-09 DIAGNOSIS — A4901 Methicillin susceptible Staphylococcus aureus infection, unspecified site: Secondary | ICD-10-CM | POA: Diagnosis not present

## 2014-03-11 ENCOUNTER — Telehealth: Payer: Self-pay | Admitting: Internal Medicine

## 2014-03-11 ENCOUNTER — Ambulatory Visit (HOSPITAL_COMMUNITY): Payer: Medicare Other

## 2014-03-11 NOTE — Telephone Encounter (Signed)
Spoke with [facility] home care nurse. She was calling to get orders for port a cath care. infomed her that she will need to contact the patient's surgeon, oncologist or PCP. We are his cardiologist and are not following the port a cath. She voiced her understanding and will try to contact the patient's PCP. She doesn't have any surgeon's name listed.

## 2014-03-11 NOTE — Progress Notes (Signed)
Received phone call from patients "mother" Thomas [name] stating that the patient had his "port" taken out on the last admission. Caller was requesting the "name of the doctor who removed the port because he left gauze in the area".  Stated that the [facility] nurse was unable to remove gauze today during their visit even after soaking it.  Stated "they are going to have to put him to sleep to get it out". Writer called and spoke with Thomas Dike PA regarding the situation. Thomas [name] advised to have patient see PCP or call [phone] to be see at the [facility].  This Probation officer called Ms. Thomas [name] back and provided the above information. Ms. Thomas [name] was thankful for the call and verbalized understanding. Thomas [name], Thomas [name]

## 2014-03-13 ENCOUNTER — Other Ambulatory Visit: Payer: Medicare Other

## 2014-03-13 ENCOUNTER — Ambulatory Visit: Payer: Medicare Other

## 2014-03-13 ENCOUNTER — Ambulatory Visit: Payer: Medicare Other | Admitting: Oncology

## 2014-03-13 ENCOUNTER — Other Ambulatory Visit (HOSPITAL_COMMUNITY): Payer: Self-pay | Admitting: Interventional Radiology

## 2014-03-13 ENCOUNTER — Inpatient Hospital Stay (HOSPITAL_COMMUNITY)
Admission: RE | Admit: 2014-03-13 | Discharge: 2014-03-13 | Disposition: A | Discharge status: Home / Self Care | Payer: Medicare Other | Source: Ambulatory Visit | Attending: Interventional Radiology | Admitting: Interventional Radiology

## 2014-03-13 DIAGNOSIS — Z95828 Presence of other vascular implants and grafts: Secondary | ICD-10-CM

## 2014-03-13 NOTE — Progress Notes (Signed)
Patient ID: Thomas [name], male   DOB: 01/15/1981, 33 y.o.   MRN: [id number]   Called and LM to mothers phone to have pt come to IR at [facility] today at 100 pm. Left phone #s to call if issues NO show at this point

## 2014-03-14 ENCOUNTER — Ambulatory Visit: Payer: Medicare Other

## 2014-03-16 ENCOUNTER — Emergency Department (HOSPITAL_COMMUNITY)
Admission: EM | Admit: 2014-03-16 | Discharge: 2014-03-17 | Discharge status: Against Medical Advice | Payer: Medicare Other | Attending: Emergency Medicine | Admitting: Emergency Medicine

## 2014-03-16 ENCOUNTER — Encounter (HOSPITAL_COMMUNITY): Payer: Self-pay | Admitting: Emergency Medicine

## 2014-03-16 DIAGNOSIS — T80211A Bloodstream infection due to central venous catheter, initial encounter: Secondary | ICD-10-CM | POA: Diagnosis not present

## 2014-03-16 DIAGNOSIS — N211 Calculus in urethra: Secondary | ICD-10-CM | POA: Diagnosis not present

## 2014-03-16 DIAGNOSIS — Z4800 Encounter for change or removal of nonsurgical wound dressing: Secondary | ICD-10-CM | POA: Diagnosis not present

## 2014-03-16 DIAGNOSIS — Z87891 Personal history of nicotine dependence: Secondary | ICD-10-CM | POA: Insufficient documentation

## 2014-03-16 DIAGNOSIS — IMO0002 Reserved for concepts with insufficient information to code with codable children: Secondary | ICD-10-CM | POA: Diagnosis not present

## 2014-03-16 DIAGNOSIS — N1330 Unspecified hydronephrosis: Secondary | ICD-10-CM | POA: Diagnosis not present

## 2014-03-16 DIAGNOSIS — Z5181 Encounter for therapeutic drug level monitoring: Secondary | ICD-10-CM | POA: Diagnosis not present

## 2014-03-16 DIAGNOSIS — Z48816 Encounter for surgical aftercare following surgery on the genitourinary system: Secondary | ICD-10-CM | POA: Diagnosis not present

## 2014-03-16 DIAGNOSIS — Z4801 Encounter for change or removal of surgical wound dressing: Secondary | ICD-10-CM | POA: Insufficient documentation

## 2014-03-16 DIAGNOSIS — B20 Human immunodeficiency virus [HIV] disease: Secondary | ICD-10-CM | POA: Diagnosis not present

## 2014-03-16 DIAGNOSIS — A4901 Methicillin susceptible Staphylococcus aureus infection, unspecified site: Secondary | ICD-10-CM | POA: Diagnosis not present

## 2014-03-16 NOTE — ED Notes (Signed)
No answer in lobby.

## 2014-03-16 NOTE — ED Notes (Signed)
Pt in stating he had a port removed from the right chest wall three weeks ago, states they placed packing at that time and he is here to get it removed, states he attempted to remove it at home but it was painful, no redness or swelling noted, pt denies drainage from area

## 2014-03-17 ENCOUNTER — Emergency Department (HOSPITAL_COMMUNITY)
Admit: 2014-03-17 | Discharge: 2014-03-17 | Disposition: A | Discharge status: Home / Self Care | Payer: Medicare Other | Attending: Interventional Radiology | Admitting: Interventional Radiology

## 2014-03-17 ENCOUNTER — Emergency Department (HOSPITAL_COMMUNITY)
Admission: EM | Admit: 2014-03-17 | Discharge: 2014-03-17 | Disposition: A | Discharge status: Home / Self Care | Payer: Medicare Other | Attending: Emergency Medicine | Admitting: Emergency Medicine

## 2014-03-17 ENCOUNTER — Encounter (HOSPITAL_COMMUNITY): Payer: Self-pay | Admitting: Emergency Medicine

## 2014-03-17 ENCOUNTER — Encounter (HOSPITAL_COMMUNITY): Admission: RE | Admit: 2014-03-17 | Payer: Medicare Other | Source: Ambulatory Visit

## 2014-03-17 DIAGNOSIS — Z21 Asymptomatic human immunodeficiency virus [HIV] infection status: Secondary | ICD-10-CM | POA: Diagnosis not present

## 2014-03-17 DIAGNOSIS — Z87448 Personal history of other diseases of urinary system: Secondary | ICD-10-CM | POA: Diagnosis not present

## 2014-03-17 DIAGNOSIS — Y838 Other surgical procedures as the cause of abnormal reaction of the patient, or of later complication, without mention of misadventure at the time of the procedure: Secondary | ICD-10-CM | POA: Diagnosis not present

## 2014-03-17 DIAGNOSIS — Z87891 Personal history of nicotine dependence: Secondary | ICD-10-CM | POA: Diagnosis not present

## 2014-03-17 DIAGNOSIS — Z8679 Personal history of other diseases of the circulatory system: Secondary | ICD-10-CM | POA: Diagnosis not present

## 2014-03-17 DIAGNOSIS — T148XXA Other injury of unspecified body region, initial encounter: Secondary | ICD-10-CM

## 2014-03-17 DIAGNOSIS — IMO0002 Reserved for concepts with insufficient information to code with codable children: Secondary | ICD-10-CM | POA: Diagnosis not present

## 2014-03-17 DIAGNOSIS — Z79899 Other long term (current) drug therapy: Secondary | ICD-10-CM | POA: Insufficient documentation

## 2014-03-17 DIAGNOSIS — Z872 Personal history of diseases of the skin and subcutaneous tissue: Secondary | ICD-10-CM | POA: Diagnosis not present

## 2014-03-17 DIAGNOSIS — Z859 Personal history of malignant neoplasm, unspecified: Secondary | ICD-10-CM | POA: Insufficient documentation

## 2014-03-17 DIAGNOSIS — Z4801 Encounter for change or removal of surgical wound dressing: Secondary | ICD-10-CM | POA: Insufficient documentation

## 2014-03-17 DIAGNOSIS — Z4800 Encounter for change or removal of nonsurgical wound dressing: Secondary | ICD-10-CM | POA: Diagnosis not present

## 2014-03-17 DIAGNOSIS — Z95828 Presence of other vascular implants and grafts: Secondary | ICD-10-CM

## 2014-03-17 MED ORDER — LIDOCAINE HCL 1 % IJ SOLN
INTRAMUSCULAR | Status: AC
  Filled 2014-03-17: qty 20

## 2014-03-17 MED ORDER — CHLORHEXIDINE GLUCONATE 4 % EX LIQD
CUTANEOUS | Status: AC
  Filled 2014-03-17: qty 15

## 2014-03-17 NOTE — ED Notes (Signed)
Pt called 3 times, 5 minutes a part, in order to be taken back to a treatment room. No response from patient, appears patient left without being seen after triage.

## 2014-03-17 NOTE — ED Notes (Signed)
Pt to ir

## 2014-03-17 NOTE — Discharge Instructions (Signed)

## 2014-03-17 NOTE — ED Provider Notes (Signed)
CSN: [id number]     Arrival date & time 03/17/14  1105 History   First MD Initiated Contact with Patient 03/17/14 1214     Chief Complaint  Patient presents with  . Wound Check     (Consider location/radiation/quality/duration/timing/severity/associated sxs/prior Treatment) HPI Comments: Patient is a 33 year old male with history of HIV disease and Hodgkin's lymphoma. He recently had a Port-A-Cath removed from his right upper chest during her previous hospitalization. This was approximately 3 weeks ago. He was discharged, not realizing that there was gauze left in the pocket where his Port-A-Cath is. The gauze is dried out and is very uncomfortable. Home health attempted to remove it however it caused him pain and he was sent here to have this looked at.  The history is provided by the patient and a relative.    Past Medical History  Diagnosis Date  . HIV disease 06/25/2012  . Skin lesion 07/03/2012  . Leg pain 07/03/2012  . Sinus tachycardia 08/06/2012  . Penile cyst 08/06/2012  . Cancer    Past Surgical History  Procedure Laterality Date  . 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  this after several previous I and Ds of rectal abcess.   [name] node biopsy Right 08/08/2013    Procedure: SUPRACLAVICULAR LYMPH NODE BIOPSY;  Surgeon: Gaye Pollack, MD;  Location: [facility] OR;  Service: Thoracic;  Laterality: Right;  . 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 Tee without cardioversion N/A 02/27/2014    Procedure: TRANSESOPHAGEAL ECHOCARDIOGRAM (TEE);  Surgeon: Pixie Casino, MD;  Location: [facility] ENDOSCOPY;  Service: Cardiovascular;  Laterality: N/A;   History reviewed. No pertinent family history. History  Substance Use Topics  . Smoking status: Former Smoker -- 1.00 packs/day for 15 years    Types: Cigarettes    Start date: 08/24/2013  . Smokeless tobacco: Never Used     Comment: quitline info, counseling  . Alcohol Use: No     Review of Systems  All other systems reviewed and are negative.     Allergies  Tylenol  Home Medications   Prior to Admission medications   Medication Sig Start Date End Date Taking? Authorizing Provider  Abacavir-Dolutegravir-Lamivud (TRIUMEQ) 600-50-300 MG TABS Take 1 tablet by mouth daily. 03/03/14   Modena Jansky, MD  calcium carbonate (TUMS - DOSED IN MG ELEMENTAL CALCIUM) 500 MG chewable tablet Chew 4 tablets (800 mg of elemental calcium total) by mouth 3 (three) times daily. 01/22/14   Kelvin Cellar, MD  feeding supplement, ENSURE COMPLETE, (ENSURE COMPLETE) LIQD Take 237 mLs by mouth 3 (three) times daily between meals. 03/03/14   Modena Jansky, MD  ferrous sulfate 325 (65 FE) MG tablet Take 1 tablet (325 mg total) by mouth 2 (two) times daily with a meal. 10/21/13   [name], MD  prochlorperazine (COMPAZINE) 10 MG tablet Take 1 tablet (10 mg total) by mouth every 6 (six) hours as needed for nausea or vomiting. 11/20/13   Wyatt Portela, MD   BP 141/120  Pulse 100  Temp(Src) 97.6 F (36.4 C) (Oral)  Resp 18  SpO2 100% Physical Exam  Nursing note and vitals reviewed. Constitutional: He is oriented to person, place, and time. He appears well-developed and well-nourished. No distress.  HENT:  Head: Normocephalic and atraumatic.  Neck: Normal range of motion. Neck supple.  Musculoskeletal: Normal range of motion.  Neurological: He is alert and oriented to person, place, and  time.  Skin: Skin is warm and dry. He is not diaphoretic.  The pocket for the previous Port-A-Cath is noted to have a piece of gauze in place. It is protruding from the wound is dried out and adhering to the granulation tissue which is present at the wound opening. There is no surrounding erythema, redness, or purulent drainage.    ED Course  Procedures (including critical care time) Labs Review Labs Reviewed - No data to display  Imaging Review No results found.   EKG  Interpretation None      MDM   Final diagnoses:  None    Patient presents with retained gauze in the pouch where his Port-A-Cath was removed 3 weeks ago. His mother is quite upset that this was not removed. I've spoken with interventional radiology who will take the patient to the radiology department to remove the gauze. This was performed and the patient is now appropriate for discharge.    Veryl Speak, MD 03/17/14 [phone]

## 2014-03-17 NOTE — ED Notes (Signed)
Pt aware waiting for ir pt npo and verbalizes understanding

## 2014-03-17 NOTE — Procedures (Signed)
Successful bedside removal of pt's wet-to-dry dressing.  No complications. Pt should continue routine dressing changes until port incision is fully healed.

## 2014-03-17 NOTE — ED Notes (Signed)
Pt reports having a port removed from right chest 3 weeks ago, still had a gauze or packing in place that was never removed. No fever, no redness or swelling to area noted.

## 2014-03-17 NOTE — ED Notes (Signed)
returned from [facility]

## 2014-03-18 DIAGNOSIS — N1330 Unspecified hydronephrosis: Secondary | ICD-10-CM | POA: Diagnosis not present

## 2014-03-18 DIAGNOSIS — N201 Calculus of ureter: Secondary | ICD-10-CM | POA: Diagnosis not present

## 2014-03-18 DIAGNOSIS — N135 Crossing vessel and stricture of ureter without hydronephrosis: Secondary | ICD-10-CM | POA: Diagnosis not present

## 2014-03-20 ENCOUNTER — Telehealth: Payer: Self-pay | Admitting: *Deleted

## 2014-03-20 NOTE — Telephone Encounter (Signed)
Donita from [facility] also wants to know if she should draw CBC diff, BUN, Creatinine on Monday with patient's PICC dressing change.  Patient has completed IV abx. Landis Gandy, RN

## 2014-03-20 NOTE — Telephone Encounter (Signed)
Donita the RN from [facility] that is taking care of the patient called to get a D/C PICC on this patient as his medication order has stopped. Advised her that he has an appt here 03/26/14 at which time the doctor will decide if the PICC should be D/C. Also advised to make sure the patient knows to flush the line daily and has supplies on hand.

## 2014-03-23 DIAGNOSIS — B20 Human immunodeficiency virus [HIV] disease: Secondary | ICD-10-CM | POA: Diagnosis not present

## 2014-03-23 DIAGNOSIS — N211 Calculus in urethra: Secondary | ICD-10-CM | POA: Diagnosis not present

## 2014-03-23 DIAGNOSIS — N1330 Unspecified hydronephrosis: Secondary | ICD-10-CM | POA: Diagnosis not present

## 2014-03-23 DIAGNOSIS — Z5181 Encounter for therapeutic drug level monitoring: Secondary | ICD-10-CM | POA: Diagnosis not present

## 2014-03-23 DIAGNOSIS — A4901 Methicillin susceptible Staphylococcus aureus infection, unspecified site: Secondary | ICD-10-CM | POA: Diagnosis not present

## 2014-03-23 DIAGNOSIS — T80211A Bloodstream infection due to central venous catheter, initial encounter: Secondary | ICD-10-CM | POA: Diagnosis not present

## 2014-03-23 DIAGNOSIS — Z48816 Encounter for surgical aftercare following surgery on the genitourinary system: Secondary | ICD-10-CM | POA: Diagnosis not present

## 2014-03-23 NOTE — Telephone Encounter (Signed)
Yes

## 2014-03-23 NOTE — Telephone Encounter (Signed)
Notified [facility] RN. Thank you.

## 2014-03-24 ENCOUNTER — Ambulatory Visit (HOSPITAL_COMMUNITY)
Admission: RE | Admit: 2014-03-24 | Discharge: 2014-03-24 | Disposition: A | Discharge status: Home / Self Care | Payer: Medicare Other | Source: Ambulatory Visit | Attending: Urology | Admitting: Urology

## 2014-03-24 DIAGNOSIS — N2889 Other specified disorders of kidney and ureter: Secondary | ICD-10-CM | POA: Diagnosis not present

## 2014-03-24 DIAGNOSIS — N135 Crossing vessel and stricture of ureter without hydronephrosis: Secondary | ICD-10-CM

## 2014-03-24 MED ORDER — TECHNETIUM TC 99M MERTIATIDE
16.0000 | Freq: Once | INTRAVENOUS | Status: AC | PRN
  Administered 2014-03-24: 16 via INTRAVENOUS

## 2014-03-26 ENCOUNTER — Inpatient Hospital Stay: Payer: Medicare Other | Admitting: Internal Medicine

## 2014-03-27 ENCOUNTER — Ambulatory Visit: Payer: Medicare Other

## 2014-03-27 ENCOUNTER — Other Ambulatory Visit (HOSPITAL_COMMUNITY): Payer: Self-pay | Admitting: Internal Medicine

## 2014-03-27 ENCOUNTER — Ambulatory Visit: Payer: Medicare Other | Admitting: Physician Assistant

## 2014-03-27 ENCOUNTER — Other Ambulatory Visit: Payer: Medicare Other

## 2014-03-28 ENCOUNTER — Ambulatory Visit: Payer: Medicare Other

## 2014-03-30 ENCOUNTER — Telehealth: Payer: Self-pay | Admitting: Infectious Diseases

## 2014-03-30 DIAGNOSIS — B20 Human immunodeficiency virus [HIV] disease: Secondary | ICD-10-CM

## 2014-03-30 MED ORDER — ABACAVIR-DOLUTEGRAVIR-LAMIVUD 600-50-300 MG PO TABS: 1.0000 | ORAL_TABLET | Freq: Every day | ORAL | Status: DC

## 2014-03-30 NOTE — Telephone Encounter (Signed)
Received call from mother that pt needed refills. I asked that she call clinic tomorrow.  She made it clear that he needed this medicine to live and had to take it for the rest of his life.  He has 3 pills remaining.  I asked her to call clinic tomorrow.  She reiterated above I asked her to call clinic tomorrow.  I let her know that I understood the HIV medications.   I told her i would take of it and thanked her for her call.  I called her pharmacy, it is closed for the holiday.

## 2014-03-31 ENCOUNTER — Other Ambulatory Visit: Payer: Self-pay | Admitting: Internal Medicine

## 2014-03-31 ENCOUNTER — Other Ambulatory Visit: Payer: Self-pay | Admitting: *Deleted

## 2014-03-31 ENCOUNTER — Telehealth: Payer: Self-pay | Admitting: *Deleted

## 2014-03-31 DIAGNOSIS — B20 Human immunodeficiency virus [HIV] disease: Secondary | ICD-10-CM | POA: Diagnosis not present

## 2014-03-31 DIAGNOSIS — T80211A Bloodstream infection due to central venous catheter, initial encounter: Secondary | ICD-10-CM | POA: Diagnosis not present

## 2014-03-31 DIAGNOSIS — Z48816 Encounter for surgical aftercare following surgery on the genitourinary system: Secondary | ICD-10-CM | POA: Diagnosis not present

## 2014-03-31 DIAGNOSIS — N1330 Unspecified hydronephrosis: Secondary | ICD-10-CM | POA: Diagnosis not present

## 2014-03-31 DIAGNOSIS — C8190 Hodgkin lymphoma, unspecified, unspecified site: Secondary | ICD-10-CM | POA: Diagnosis not present

## 2014-03-31 DIAGNOSIS — N211 Calculus in urethra: Secondary | ICD-10-CM | POA: Diagnosis not present

## 2014-03-31 DIAGNOSIS — A4901 Methicillin susceptible Staphylococcus aureus infection, unspecified site: Secondary | ICD-10-CM | POA: Diagnosis not present

## 2014-03-31 MED ORDER — ABACAVIR-DOLUTEGRAVIR-LAMIVUD 600-50-300 MG PO TABS: 1.0000 | ORAL_TABLET | Freq: Every day | ORAL | Status: DC

## 2014-03-31 NOTE — Telephone Encounter (Signed)
He is coming tomorrow

## 2014-03-31 NOTE — Telephone Encounter (Signed)
He still has his PICC, completed antibiotics 2+ weeks ago.  Patient no-showed his visit last week when the need for PICC would be addressed.  Please advise if Donita, [facility] RN, should pull the PICC and if you want any labs drawn. Landis Gandy, RN

## 2014-04-01 ENCOUNTER — Ambulatory Visit (INDEPENDENT_AMBULATORY_CARE_PROVIDER_SITE_OTHER): Payer: Medicare Other | Admitting: Internal Medicine

## 2014-04-01 ENCOUNTER — Encounter: Payer: Self-pay | Admitting: Internal Medicine

## 2014-04-01 ENCOUNTER — Telehealth: Payer: Self-pay | Admitting: *Deleted

## 2014-04-01 VITALS — BP 105/69 | HR 101 | Temp 98.6°F | Wt 149.0 lb

## 2014-04-01 DIAGNOSIS — C8190 Hodgkin lymphoma, unspecified, unspecified site: Secondary | ICD-10-CM

## 2014-04-01 DIAGNOSIS — Z23 Encounter for immunization: Secondary | ICD-10-CM

## 2014-04-01 DIAGNOSIS — B029 Zoster without complications: Secondary | ICD-10-CM

## 2014-04-01 DIAGNOSIS — B20 Human immunodeficiency virus [HIV] disease: Secondary | ICD-10-CM | POA: Diagnosis not present

## 2014-04-01 DIAGNOSIS — A4901 Methicillin susceptible Staphylococcus aureus infection, unspecified site: Secondary | ICD-10-CM | POA: Diagnosis not present

## 2014-04-01 DIAGNOSIS — R7881 Bacteremia: Secondary | ICD-10-CM

## 2014-04-01 LAB — CBC WITH DIFFERENTIAL/PLATELET
BASOS ABS: 0 10*3/uL (ref 0.0–0.1)
Basophils Relative: 1 % (ref 0–1)
EOS ABS: 0.2 10*3/uL (ref 0.0–0.7)
EOS PCT: 6 % — AB (ref 0–5)
HEMATOCRIT: 32.5 % — AB (ref 39.0–52.0)
Hemoglobin: 11.2 g/dL — ABNORMAL LOW (ref 13.0–17.0)
Lymphocytes Relative: 33 % (ref 12–46)
Lymphs Abs: 1.1 10*3/uL (ref 0.7–4.0)
MCH: 34.3 pg — AB (ref 26.0–34.0)
MCHC: 34.5 g/dL (ref 30.0–36.0)
MCV: 99.4 fL (ref 78.0–100.0)
MONO ABS: 0.3 10*3/uL (ref 0.1–1.0)
Monocytes Relative: 8 % (ref 3–12)
Neutro Abs: 1.7 10*3/uL (ref 1.7–7.7)
Neutrophils Relative %: 52 % (ref 43–77)
PLATELETS: 163 10*3/uL (ref 150–400)
RBC: 3.27 MIL/uL — ABNORMAL LOW (ref 4.22–5.81)
RDW: 16.3 % — AB (ref 11.5–15.5)
WBC: 3.2 10*3/uL — ABNORMAL LOW (ref 4.0–10.5)

## 2014-04-01 LAB — COMPLETE METABOLIC PANEL WITH GFR
ALBUMIN: 3.7 g/dL (ref 3.5–5.2)
ALK PHOS: 140 U/L — AB (ref 39–117)
ALT: 11 U/L (ref 0–53)
AST: 19 U/L (ref 0–37)
BUN: 17 mg/dL (ref 6–23)
CALCIUM: 8.9 mg/dL (ref 8.4–10.5)
CHLORIDE: 106 meq/L (ref 96–112)
CO2: 23 mEq/L (ref 19–32)
Creat: 1.74 mg/dL — ABNORMAL HIGH (ref 0.50–1.35)
GFR, Est African American: 59 mL/min — ABNORMAL LOW
GFR, Est Non African American: 51 mL/min — ABNORMAL LOW
Glucose, Bld: 71 mg/dL (ref 70–99)
Potassium: 3.6 mEq/L (ref 3.5–5.3)
SODIUM: 138 meq/L (ref 135–145)
TOTAL PROTEIN: 7.3 g/dL (ref 6.0–8.3)
Total Bilirubin: 0.3 mg/dL (ref 0.2–1.2)

## 2014-04-01 MED ORDER — ABACAVIR-DOLUTEGRAVIR-LAMIVUD 600-50-300 MG PO TABS: 1.0000 | ORAL_TABLET | Freq: Every day | ORAL | Status: DC

## 2014-04-01 NOTE — Assessment & Plan Note (Signed)
He is going to call today for follow up.

## 2014-04-01 NOTE — Assessment & Plan Note (Signed)
Doing well on Triumeq and no issues.  Last creat 1.6 and stable.  No adjustments indicated.  LAbs today. RTC 3 months.

## 2014-04-01 NOTE — Progress Notes (Signed)
   Subjective:    Patient ID: Thomas [name], male    DOB: [birth year], 33 y.o.   MRN: [id number]  HPI Here for follow up from hospitalization for line infection of port a cath with Staph aureus.  He completed 3 weeks of cefazolin through 8/24.  He missed his follow up since but comes in today.  No fever, no chills.  Line area in chest is closed, no erythema, no drainage.  Has gained weight since last visit in the Spring.  Blood cultures cleared and no signs of IE.  He is eating.  He has not followed up with hematology.  Has urostomy stil and told he is going to need nephrectomy.  Changed to Triumeq in hospital to avoid truvada and nephrotoxic agents.     Review of Systems  Constitutional: Negative for fever and chills.  Gastrointestinal: Negative for nausea and diarrhea.  Skin: Negative for rash.  Neurological: Negative for dizziness and light-headedness.       Objective:   Physical Exam  Constitutional: He appears well-developed and well-nourished. No distress.  HENT:  Mouth/Throat: No oropharyngeal exudate.  Eyes: No scleral icterus.  Cardiovascular: Normal rate, regular rhythm and normal heart sounds.   No murmur heard. Pulmonary/Chest: Effort normal and breath sounds normal. No respiratory distress. He has no wheezes.  Genitourinary:  + right urostomy  Musculoskeletal: He exhibits no edema.  Lymphadenopathy:    He has no cervical adenopathy.  Skin: No rash noted.          Assessment & Plan:

## 2014-04-01 NOTE — Assessment & Plan Note (Signed)
Resolved.  Will pull picc

## 2014-04-01 NOTE — Telephone Encounter (Signed)
Verbal order per Dr. Linus Salmons given to [facility] [place] to pull patient's picc line. Thomas [name]

## 2014-04-02 DIAGNOSIS — T80211A Bloodstream infection due to central venous catheter, initial encounter: Secondary | ICD-10-CM | POA: Diagnosis not present

## 2014-04-02 DIAGNOSIS — N1330 Unspecified hydronephrosis: Secondary | ICD-10-CM | POA: Diagnosis not present

## 2014-04-02 DIAGNOSIS — N211 Calculus in urethra: Secondary | ICD-10-CM | POA: Diagnosis not present

## 2014-04-02 DIAGNOSIS — Z48816 Encounter for surgical aftercare following surgery on the genitourinary system: Secondary | ICD-10-CM | POA: Diagnosis not present

## 2014-04-02 DIAGNOSIS — A4901 Methicillin susceptible Staphylococcus aureus infection, unspecified site: Secondary | ICD-10-CM | POA: Diagnosis not present

## 2014-04-02 DIAGNOSIS — B20 Human immunodeficiency virus [HIV] disease: Secondary | ICD-10-CM | POA: Diagnosis not present

## 2014-04-02 LAB — T-HELPER CELL (CD4) - (RCID CLINIC ONLY)
CD4 % Helper T Cell: 40 % (ref 33–55)
CD4 T Cell Abs: 440 /uL (ref 400–2700)

## 2014-04-02 NOTE — Assessment & Plan Note (Signed)
Shingles and weakness in left leg during hospitialization continues to improve.  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  As he is improving and significant renal issues, no indication for treatment.  No indication to postpone any surgical procedures (nephrectomy) from an ID perspective.  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

## 2014-04-03 ENCOUNTER — Other Ambulatory Visit: Payer: Self-pay | Admitting: *Deleted

## 2014-04-03 DIAGNOSIS — B20 Human immunodeficiency virus [HIV] disease: Secondary | ICD-10-CM

## 2014-04-03 LAB — HIV-1 RNA QUANT-NO REFLEX-BLD: HIV-1 RNA Quant, Log: <1.3 {Log} (ref <1.30)

## 2014-04-03 MED ORDER — ABACAVIR-DOLUTEGRAVIR-LAMIVUD 600-50-300 MG PO TABS: 1.0000 | ORAL_TABLET | Freq: Every day | ORAL | Status: DC

## 2014-04-03 NOTE — Progress Notes (Signed)
[facility] unable to get this before Monday, 04/06/14. Rx transferred to [facility] for pick up today.

## 2014-04-14 ENCOUNTER — Other Ambulatory Visit: Payer: Self-pay | Admitting: Urology

## 2014-04-20 ENCOUNTER — Encounter (HOSPITAL_COMMUNITY): Payer: Self-pay | Admitting: Pharmacy Technician

## 2014-04-21 NOTE — Progress Notes (Signed)
ekg 02-23-14 epic Chest xray 02-23-14 epic Echo 02-25-14 epic

## 2014-04-21 NOTE — Patient Instructions (Addendum)
20 Thomas [name]  04/21/2014   Your procedure is scheduled on: Thursday October 8th, 2015  Report to [facility] Main Entrance and follow signs to  [place] at 930 AM.  Call this number if you have problems the morning of surgery [phone]   Remember:  Do not eat food or drink liquids :After Midnight.     Take these medicines the morning of surgery with A SIP OF WATER: no meds to take                               You may not have any metal on your body including hair pins and piercings  Do not wear jewelry, make-up, lotions, powders, or deodorant.   Men may shave face and neck.  Do not bring valuables to the hospital. [place].  Contacts, dentures or bridgework may not be worn into surgery.  Leave suitcase in the car. After surgery it may be brought to your room.  For patients admitted to the hospital, checkout time is 11:00 AM the day of discharge.   Patients discharged the day of surgery will not be allowed to drive home.  Name and phone number of your driver:  Special Instructions: N/A ________________________________________________________________________  [facility] - Preparing for Surgery Before surgery, you can play an important role.  Because skin is not sterile, your skin needs to be as free of germs as possible.  You can reduce the number of germs on your skin by washing with CHG (chlorahexidine gluconate) soap before surgery.  CHG is an antiseptic cleaner which kills germs and bonds with the skin to continue killing germs even after washing. Please DO NOT use if you have an allergy to CHG or antibacterial soaps.  If your skin becomes reddened/irritated stop using the CHG and inform your nurse when you arrive at Short Stay. Do not shave (including legs and underarms) for at least 48 hours prior to the first CHG shower.  You may shave your face/neck. Please follow these instructions carefully:  1.  Shower with CHG  Soap the night before surgery and the  morning of Surgery.  2.  If you choose to wash your hair, wash your hair first as usual with your  normal  shampoo.  3.  After you shampoo, rinse your hair and body thoroughly to remove the  shampoo.                           4.  Use CHG as you would any other liquid soap.  You can apply chg directly  to the skin and wash                       Gently with a scrungie or clean washcloth.  5.  Apply the CHG Soap to your body ONLY FROM THE NECK DOWN.   Do not use on face/ open                           Wound or open sores. Avoid contact with eyes, ears mouth and genitals (private parts).                       Wash face,  Genitals (private parts) with your normal soap.  6.  Wash thoroughly, paying special attention to the area where your surgery  will be performed.  7.  Thoroughly rinse your body with warm water from the neck down.  8.  DO NOT shower/wash with your normal soap after using and rinsing off  the CHG Soap.                9.  Pat yourself dry with a clean towel.            10.  Wear clean pajamas.            11.  Place clean sheets on your bed the night of your first shower and do not  sleep with pets. Day of Surgery : Do not apply any lotions/deodorants the morning of surgery.  Please wear clean clothes to the hospital/surgery center.  FAILURE TO FOLLOW THESE INSTRUCTIONS MAY RESULT IN THE CANCELLATION OF YOUR SURGERY PATIENT SIGNATURE_________________________________  NURSE SIGNATURE__________________________________  ________________________________________________________________________   Thomas [name]  An incentive spirometer is a tool that can help keep your lungs clear and active. This tool measures how well you are filling your lungs with each breath. Taking long deep breaths may help reverse or decrease the chance of developing breathing (pulmonary) problems (especially infection) following:  A long period of time  when you are unable to move or be active. BEFORE THE PROCEDURE   If the spirometer includes an indicator to show your best effort, your nurse or respiratory therapist will set it to a desired goal.  If possible, sit up straight or lean slightly forward. Try not to slouch.  Hold the incentive spirometer in an upright position. INSTRUCTIONS FOR USE  1. Sit on the edge of your bed if possible, or sit up as far as you can in bed or on a chair. 2. Hold the incentive spirometer in an upright position. 3. Breathe out normally. 4. Place the mouthpiece in your mouth and seal your lips tightly around it. 5. Breathe in slowly and as deeply as possible, raising the piston or the ball toward the top of the column. 6. Hold your breath for 3-5 seconds or for as long as possible. Allow the piston or ball to fall to the bottom of the column. 7. Remove the mouthpiece from your mouth and breathe out normally. 8. Rest for a few seconds and repeat Steps 1 through 7 at least 10 times every 1-2 hours when you are awake. Take your time and take a few normal breaths between deep breaths. 9. The spirometer may include an indicator to show your best effort. Use the indicator as a goal to work toward during each repetition. 10. After each set of 10 deep breaths, practice coughing to be sure your lungs are clear. If you have an incision (the cut made at the time of surgery), support your incision when coughing by placing a pillow or rolled up towels firmly against it. Once you are able to get out of bed, walk around indoors and cough well. You may stop using the incentive spirometer when instructed by your caregiver.  RISKS AND COMPLICATIONS  Take your time so you do not get dizzy or light-headed.  If you are in pain, you may need to take or ask for pain medication before doing incentive spirometry. It is harder to take a deep breath if you are having pain. AFTER USE  Rest and breathe slowly and easily.  It can be  helpful to keep track of a log of  your progress. Your caregiver can provide you with a simple table to help with this. If you are using the spirometer at home, follow these instructions: [place] IF:   You are having difficultly using the spirometer.  You have trouble using the spirometer as often as instructed.  Your pain medication is not giving enough relief while using the spirometer.  You develop fever of 100.5 F (38.1 C) or higher. SEEK IMMEDIATE MEDICAL CARE IF:   You cough up bloody sputum that had not been present before.  You develop fever of 102 F (38.9 C) or greater.  You develop worsening pain at or near the incision site. MAKE SURE YOU:   Understand these instructions.  Will watch your condition.  Will get help right away if you are not doing well or get worse. Document Released: 11/20/2006 Document Revised: 10/02/2011 Document Reviewed: 01/21/2007 ExitCare Patient Information 2014 Exit[employer], [place].   ________________________________________________________________________  WHAT IS A BLOOD TRANSFUSION? Blood Transfusion Information  A transfusion is the replacement of blood or some of its parts. Blood is made up of multiple cells which provide different functions.  Red blood cells carry oxygen and are used for blood loss replacement.  White blood cells fight against infection.  Platelets control bleeding.  Plasma helps clot blood.  Other blood products are available for specialized needs, such as hemophilia or other clotting disorders. BEFORE THE TRANSFUSION  Who gives blood for transfusions?   Healthy volunteers who are fully evaluated to make sure their blood is safe. This is blood bank blood. Transfusion therapy is the safest it has ever been in the practice of medicine. Before blood is taken from a donor, a complete history is taken to make sure that person has no history of diseases nor engages in risky social behavior (examples are  intravenous drug use or sexual activity with multiple partners). The donor's travel history is screened to minimize risk of transmitting infections, such as malaria. The donated blood is tested for signs of infectious diseases, such as HIV and hepatitis. The blood is then tested to be sure it is compatible with you in order to minimize the chance of a transfusion reaction. If you or a relative donates blood, this is often done in anticipation of surgery and is not appropriate for emergency situations. It takes many days to process the donated blood. RISKS AND COMPLICATIONS Although transfusion therapy is very safe and saves many lives, the main dangers of transfusion include:   Getting an infectious disease.  Developing a transfusion reaction. This is an allergic reaction to something in the blood you were given. Every precaution is taken to prevent this. The decision to have a blood transfusion has been considered carefully by your caregiver before blood is given. Blood is not given unless the benefits outweigh the risks. AFTER THE TRANSFUSION  Right after receiving a blood transfusion, you will usually feel much better and more energetic. This is especially true if your red blood cells have gotten low (anemic). The transfusion raises the level of the red blood cells which carry oxygen, and this usually causes an energy increase.  The nurse administering the transfusion will monitor you carefully for complications. HOME CARE INSTRUCTIONS  No special instructions are needed after a transfusion. You may find your energy is better. Speak with your caregiver about any limitations on activity for underlying diseases you may have. SEEK MEDICAL CARE IF:   Your condition is not improving after your transfusion.  You develop redness or  irritation at the intravenous (IV) site. SEEK IMMEDIATE MEDICAL CARE IF:  Any of the following symptoms occur over the next 12 hours:  Shaking chills.  You have a  temperature by mouth above 102 F (38.9 C), not controlled by medicine.  Chest, back, or muscle pain.  People around you feel you are not acting correctly or are confused.  Shortness of breath or difficulty breathing.  Dizziness and fainting.  You get a rash or develop hives.  You have a decrease in urine output.  Your urine turns a dark color or changes to pink, red, or brown. Any of the following symptoms occur over the next 10 days:  You have a temperature by mouth above 102 F (38.9 C), not controlled by medicine.  Shortness of breath.  Weakness after normal activity.  The white part of the eye turns yellow (jaundice).  You have a decrease in the amount of urine or are urinating less often.  Your urine turns a dark color or changes to pink, red, or brown. Document Released: 07/07/2000 Document Revised: 10/02/2011 Document Reviewed: 02/24/2008 [facility] Patient Information 2014 [place], [place].  _______________________________________________________________________

## 2014-04-22 ENCOUNTER — Encounter (HOSPITAL_COMMUNITY)
Admission: RE | Admit: 2014-04-22 | Discharge: 2014-04-22 | Disposition: A | Discharge status: Home / Self Care | Payer: Medicare Other | Source: Ambulatory Visit | Attending: Urology | Admitting: Urology

## 2014-04-22 ENCOUNTER — Encounter (HOSPITAL_COMMUNITY): Payer: Self-pay

## 2014-04-22 DIAGNOSIS — N201 Calculus of ureter: Secondary | ICD-10-CM | POA: Diagnosis not present

## 2014-04-22 DIAGNOSIS — N289 Disorder of kidney and ureter, unspecified: Secondary | ICD-10-CM | POA: Diagnosis not present

## 2014-04-22 DIAGNOSIS — Z01812 Encounter for preprocedural laboratory examination: Secondary | ICD-10-CM | POA: Insufficient documentation

## 2014-04-22 HISTORY — DX: Personal history of other medical treatment: Z92.89

## 2014-04-22 LAB — COMPREHENSIVE METABOLIC PANEL
ALT: 10 U/L (ref 0–53)
ANION GAP: 13 (ref 5–15)
AST: 17 U/L (ref 0–37)
Albumin: 3.5 g/dL (ref 3.5–5.2)
Alkaline Phosphatase: 193 U/L — ABNORMAL HIGH (ref 39–117)
BUN: 25 mg/dL — ABNORMAL HIGH (ref 6–23)
CALCIUM: 9.6 mg/dL (ref 8.4–10.5)
CO2: 23 mEq/L (ref 19–32)
CREATININE: 2.04 mg/dL — AB (ref 0.50–1.35)
Chloride: 104 mEq/L (ref 96–112)
GFR calc Af Amer: 48 mL/min — ABNORMAL LOW (ref >90)
GFR calc non Af Amer: 42 mL/min — ABNORMAL LOW (ref >90)
GLUCOSE: 83 mg/dL (ref 70–99)
Potassium: 3.9 mEq/L (ref 3.7–5.3)
Sodium: 140 mEq/L (ref 137–147)
TOTAL PROTEIN: 8.3 g/dL (ref 6.0–8.3)
Total Bilirubin: 0.3 mg/dL (ref 0.3–1.2)

## 2014-04-22 LAB — CBC
HEMATOCRIT: 39.2 % (ref 39.0–52.0)
HEMOGLOBIN: 13.2 g/dL (ref 13.0–17.0)
MCH: 34.7 pg — AB (ref 26.0–34.0)
MCHC: 33.7 g/dL (ref 30.0–36.0)
MCV: 103.2 fL — AB (ref 78.0–100.0)
Platelets: 152 10*3/uL (ref 150–400)
RBC: 3.8 MIL/uL — ABNORMAL LOW (ref 4.22–5.81)
RDW: 14.2 % (ref 11.5–15.5)
WBC: 3.7 10*3/uL — ABNORMAL LOW (ref 4.0–10.5)

## 2014-04-22 NOTE — Progress Notes (Signed)
cmet results faxed to dr borden by epic

## 2014-04-23 ENCOUNTER — Other Ambulatory Visit: Payer: Self-pay | Admitting: Urology

## 2014-04-29 NOTE — H&P (Signed)
  History of Present Illness Thomas [name] is a 33 year old with lymphoma and HIV and the following urologic history:    1) Urolithiasis: I was initially consulted to see him in August 2015 during a hospitalization for MSSA bacteremia secondary to an infected port-a-cath. He was noted to have right hydronephrosis which was secondary to a large group of mid ureteral stones measuring about 1 cm. On review of his past imaging, it was noted that his obstruction had been present since at least January 2015. He was hypotensive at the time and underwent right nephrostomy tube placement for drainage.     Past Medical History Problems  1. History of non-Hodgkin's lymphoma (V10.79) 2. History of HIV disease (042)  Surgical History Problems  1. History of Anal Fistulotomy (Subcutaneous) 2. History of Biopsy Skin 3. History of Bone Marrow Transplant  Current Meds 1. Ancef 1 GM SOLR;  Therapy: (Recorded:26Aug2015) to Recorded 2. Compazine 10 MG Oral Tablet;  Therapy: (Recorded:26Aug2015) to Recorded 3. Ensure LIQD;  Therapy: (Recorded:26Aug2015) to Recorded 4. Ferrous Sulfate 325 MG CAPS;  Therapy: (Recorded:26Aug2015) to Recorded 5. Triumeq 600-50-300 MG Oral Tablet;  Therapy: (Recorded:26Aug2015) to Recorded 6. Tums CHEW;  Therapy: (Recorded:26Aug2015) to Recorded  Allergies Medication  1. Tylenol TABS  Family History Problems  1. Family history of Nephrolithiasis, uric acid : Mother  Social History Problems    Denied: History of Alcohol use   Disabled   Former smoker (V15.82)   <1/2 ppd for 5 years and quit 2014   Single  Review of Systems  Genitourinary: no hematuria.  Gastrointestinal: no nausea and no vomiting.  Constitutional: no fever.    Vitals  Height: 6 ft 1 in Weight: 140 lb  BMI Calculated: 18.47 BSA Calculated: 1.85   Physical Exam Constitutional: Well nourished and well developed . No acute distress.  ENT:. The ears and nose are normal in appearance.   Neck: The appearance of the neck is normal and no neck mass is present.  Pulmonary: No respiratory distress, normal respiratory rhythm and effort and clear bilateral breath sounds.  Cardiovascular: Heart rate and rhythm are normal . No peripheral edema.  Abdomen: The abdomen is soft and nontender. No masses are palpated. No CVA tenderness. No hernias are palpable. No hepatosplenomegaly noted. His right-sided nephrostomy tube is in place and secure. His urine is grossly clear.  Lymphatics: The femoral and inguinal nodes are not enlarged or tender.  Skin: Normal skin turgor, no visible rash and no visible skin lesions.  Neuro/Psych:. Mood and affect are appropriate.       Discussion/Summary 1. 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  He will undergo a right laparoscopic nephrectomy.

## 2014-04-30 ENCOUNTER — Inpatient Hospital Stay (HOSPITAL_COMMUNITY): Payer: Medicare Other | Admitting: Certified Registered"

## 2014-04-30 ENCOUNTER — Inpatient Hospital Stay (HOSPITAL_COMMUNITY)
Admission: RE | Admit: 2014-04-30 | Discharge: 2014-05-02 | DRG: 659 | Disposition: A | Discharge status: Home / Self Care | Payer: Medicare Other | Source: Ambulatory Visit | Attending: Urology | Admitting: Urology

## 2014-04-30 ENCOUNTER — Encounter (HOSPITAL_COMMUNITY): Payer: Self-pay | Admitting: Certified Registered"

## 2014-04-30 ENCOUNTER — Encounter (HOSPITAL_COMMUNITY): Payer: Medicare Other | Admitting: Certified Registered"

## 2014-04-30 ENCOUNTER — Encounter (HOSPITAL_COMMUNITY)
Admission: RE | Disposition: A | Discharge status: Home / Self Care | Payer: Self-pay | Source: Ambulatory Visit | Attending: Urology

## 2014-04-30 DIAGNOSIS — N201 Calculus of ureter: Secondary | ICD-10-CM | POA: Diagnosis not present

## 2014-04-30 DIAGNOSIS — Z87440 Personal history of urinary (tract) infections: Secondary | ICD-10-CM | POA: Diagnosis not present

## 2014-04-30 DIAGNOSIS — Z79899 Other long term (current) drug therapy: Secondary | ICD-10-CM

## 2014-04-30 DIAGNOSIS — Z841 Family history of disorders of kidney and ureter: Secondary | ICD-10-CM | POA: Diagnosis not present

## 2014-04-30 DIAGNOSIS — Z01812 Encounter for preprocedural laboratory examination: Secondary | ICD-10-CM | POA: Diagnosis not present

## 2014-04-30 DIAGNOSIS — Z8572 Personal history of non-Hodgkin lymphomas: Secondary | ICD-10-CM

## 2014-04-30 DIAGNOSIS — N200 Calculus of kidney: Secondary | ICD-10-CM | POA: Diagnosis not present

## 2014-04-30 DIAGNOSIS — Z87891 Personal history of nicotine dependence: Secondary | ICD-10-CM

## 2014-04-30 DIAGNOSIS — Z888 Allergy status to other drugs, medicaments and biological substances status: Secondary | ICD-10-CM | POA: Diagnosis not present

## 2014-04-30 DIAGNOSIS — B20 Human immunodeficiency virus [HIV] disease: Secondary | ICD-10-CM | POA: Diagnosis present

## 2014-04-30 DIAGNOSIS — N132 Hydronephrosis with renal and ureteral calculous obstruction: Secondary | ICD-10-CM | POA: Diagnosis present

## 2014-04-30 DIAGNOSIS — N2889 Other specified disorders of kidney and ureter: Secondary | ICD-10-CM | POA: Diagnosis present

## 2014-04-30 DIAGNOSIS — D49519 Neoplasm of unspecified behavior of unspecified kidney: Secondary | ICD-10-CM | POA: Diagnosis present

## 2014-04-30 DIAGNOSIS — Z9481 Bone marrow transplant status: Secondary | ICD-10-CM

## 2014-04-30 DIAGNOSIS — N1330 Unspecified hydronephrosis: Secondary | ICD-10-CM | POA: Diagnosis not present

## 2014-04-30 HISTORY — PX: LAPAROSCOPIC NEPHRECTOMY: SHX1930

## 2014-04-30 LAB — BASIC METABOLIC PANEL
Anion gap: 12 (ref 5–15)
Anion gap: 11 (ref 5–15)
BUN: 23 mg/dL (ref 6–23)
BUN: 21 mg/dL (ref 6–23)
CALCIUM: 8.6 mg/dL (ref 8.4–10.5)
CHLORIDE: 104 meq/L (ref 96–112)
CO2: 24 mEq/L (ref 19–32)
CO2: 23 mEq/L (ref 19–32)
CREATININE: 1.63 mg/dL — AB (ref 0.50–1.35)
Calcium: 9.3 mg/dL (ref 8.4–10.5)
Chloride: 105 mEq/L (ref 96–112)
Creatinine, Ser: 1.88 mg/dL — ABNORMAL HIGH (ref 0.50–1.35)
GFR calc Af Amer: 63 mL/min — ABNORMAL LOW (ref >90)
GFR calc non Af Amer: 46 mL/min — ABNORMAL LOW (ref >90)
GFR, EST AFRICAN AMERICAN: 53 mL/min — AB (ref >90)
GFR, EST NON AFRICAN AMERICAN: 55 mL/min — AB (ref >90)
Glucose, Bld: 87 mg/dL (ref 70–99)
Glucose, Bld: 113 mg/dL — ABNORMAL HIGH (ref 70–99)
POTASSIUM: 3.9 meq/L (ref 3.7–5.3)
Potassium: 4.2 mEq/L (ref 3.7–5.3)
Sodium: 140 mEq/L (ref 137–147)
Sodium: 139 mEq/L (ref 137–147)

## 2014-04-30 LAB — TYPE AND SCREEN
ABO/RH(D): O POS
ANTIBODY SCREEN: NEGATIVE

## 2014-04-30 LAB — HEMOGLOBIN AND HEMATOCRIT, BLOOD
HCT: 33.5 % — ABNORMAL LOW (ref 39.0–52.0)
Hemoglobin: 11.4 g/dL — ABNORMAL LOW (ref 13.0–17.0)

## 2014-04-30 SURGERY — NEPHRECTOMY, RADICAL, LAPAROSCOPIC, ADULT
Anesthesia: General | Laterality: Right

## 2014-04-30 MED ORDER — PHENYLEPHRINE HCL 10 MG/ML IJ SOLN
INTRAMUSCULAR | Status: DC | PRN
  Administered 2014-04-30 (×2): 80 ug via INTRAVENOUS
  Administered 2014-04-30 (×3): 40 ug via INTRAVENOUS
  Administered 2014-04-30: 80 ug via INTRAVENOUS

## 2014-04-30 MED ORDER — ONDANSETRON HCL 4 MG/2ML IJ SOLN
INTRAMUSCULAR | Status: DC | PRN
  Administered 2014-04-30: 4 mg via INTRAVENOUS

## 2014-04-30 MED ORDER — HYDROMORPHONE HCL 1 MG/ML IJ SOLN
0.5000 mg | INTRAMUSCULAR | Status: DC | PRN
  Administered 2014-04-30 – 2014-05-01 (×2): 1 mg via INTRAVENOUS
  Filled 2014-04-30 (×2): qty 1

## 2014-04-30 MED ORDER — HYDROMORPHONE HCL 1 MG/ML IJ SOLN
0.2500 mg | INTRAMUSCULAR | Status: DC | PRN
  Administered 2014-04-30 (×2): 0.5 mg via INTRAVENOUS
  Administered 2014-04-30 (×2): 0.25 mg via INTRAVENOUS

## 2014-04-30 MED ORDER — BUPIVACAINE LIPOSOME 1.3 % IJ SUSP
20.0000 mL | Freq: Once | INTRAMUSCULAR | Status: AC
  Administered 2014-04-30: 20 mL
  Filled 2014-04-30: qty 20

## 2014-04-30 MED ORDER — ENSURE COMPLETE PO LIQD
237.0000 mL | Freq: Three times a day (TID) | ORAL | Status: DC
Start: 2014-04-30 — End: 2014-05-02
  Administered 2014-05-01 (×3): 237 mL via ORAL

## 2014-04-30 MED ORDER — LACTATED RINGERS IV SOLN
INTRAVENOUS | Status: DC
Start: 2014-04-30 — End: 2014-04-30
  Administered 2014-04-30 (×4): via INTRAVENOUS

## 2014-04-30 MED ORDER — PROPOFOL 10 MG/ML IV BOLUS
INTRAVENOUS | Status: AC
  Filled 2014-04-30: qty 20

## 2014-04-30 MED ORDER — MIDAZOLAM HCL 5 MG/5ML IJ SOLN
INTRAMUSCULAR | Status: DC | PRN
  Administered 2014-04-30: 2 mg via INTRAVENOUS

## 2014-04-30 MED ORDER — PROPOFOL 10 MG/ML IV BOLUS
INTRAVENOUS | Status: DC | PRN
  Administered 2014-04-30: 180 mg via INTRAVENOUS

## 2014-04-30 MED ORDER — DOCUSATE SODIUM 100 MG PO CAPS: 100.0000 mg | ORAL_CAPSULE | Freq: Two times a day (BID) | ORAL | Status: DC

## 2014-04-30 MED ORDER — LIDOCAINE HCL (CARDIAC) 20 MG/ML IV SOLN
INTRAVENOUS | Status: AC
  Filled 2014-04-30: qty 5

## 2014-04-30 MED ORDER — DIPHENHYDRAMINE HCL 12.5 MG/5ML PO ELIX: 12.5000 mg | ORAL_SOLUTION | Freq: Four times a day (QID) | ORAL | Status: DC | PRN

## 2014-04-30 MED ORDER — PHENYLEPHRINE HCL 10 MG/ML IJ SOLN
10.0000 mg | INTRAVENOUS | Status: DC | PRN
  Administered 2014-04-30: 20 ug/min via INTRAVENOUS

## 2014-04-30 MED ORDER — LAMIVUDINE 150 MG PO TABS
300.0000 mg | ORAL_TABLET | Freq: Every day | ORAL | Status: DC
  Administered 2014-05-01: 300 mg via ORAL
  Filled 2014-04-30 (×2): qty 2

## 2014-04-30 MED ORDER — 0.9 % SODIUM CHLORIDE (POUR BTL) OPTIME
TOPICAL | Status: DC | PRN
  Administered 2014-04-30: 1000 mL

## 2014-04-30 MED ORDER — DIPHENHYDRAMINE HCL 50 MG/ML IJ SOLN
12.5000 mg | Freq: Four times a day (QID) | INTRAMUSCULAR | Status: DC | PRN
Start: 2014-04-30 — End: 2014-05-02

## 2014-04-30 MED ORDER — ONDANSETRON HCL 4 MG/2ML IJ SOLN
INTRAMUSCULAR | Status: AC
  Filled 2014-04-30: qty 2

## 2014-04-30 MED ORDER — OXYCODONE HCL 5 MG PO TABS
5.0000 mg | ORAL_TABLET | ORAL | Status: DC | PRN
Start: 2014-04-30 — End: 2016-02-16

## 2014-04-30 MED ORDER — GLYCOPYRROLATE 0.2 MG/ML IJ SOLN
INTRAMUSCULAR | Status: AC
  Filled 2014-04-30: qty 3

## 2014-04-30 MED ORDER — ROCURONIUM BROMIDE 100 MG/10ML IV SOLN
INTRAVENOUS | Status: AC
  Filled 2014-04-30: qty 1

## 2014-04-30 MED ORDER — HYDROMORPHONE HCL 1 MG/ML IJ SOLN
INTRAMUSCULAR | Status: AC
  Filled 2014-04-30: qty 1

## 2014-04-30 MED ORDER — ONDANSETRON HCL 4 MG/2ML IJ SOLN: 4.0000 mg | INTRAMUSCULAR | Status: DC | PRN

## 2014-04-30 MED ORDER — DEXAMETHASONE SODIUM PHOSPHATE 10 MG/ML IJ SOLN
INTRAMUSCULAR | Status: AC
  Filled 2014-04-30: qty 1

## 2014-04-30 MED ORDER — PHENYLEPHRINE 40 MCG/ML (10ML) SYRINGE FOR IV PUSH (FOR BLOOD PRESSURE SUPPORT)
PREFILLED_SYRINGE | INTRAVENOUS | Status: AC
  Filled 2014-04-30: qty 10

## 2014-04-30 MED ORDER — CEFAZOLIN SODIUM-DEXTROSE 2-3 GM-% IV SOLR
2.0000 g | INTRAVENOUS | Status: AC
  Administered 2014-04-30: 2 g via INTRAVENOUS

## 2014-04-30 MED ORDER — FENTANYL CITRATE 0.05 MG/ML IJ SOLN
INTRAMUSCULAR | Status: DC | PRN
  Administered 2014-04-30: 50 ug via INTRAVENOUS
  Administered 2014-04-30: 100 ug via INTRAVENOUS
  Administered 2014-04-30 (×2): 50 ug via INTRAVENOUS

## 2014-04-30 MED ORDER — NEOSTIGMINE METHYLSULFATE 10 MG/10ML IV SOLN
INTRAVENOUS | Status: DC | PRN
  Administered 2014-04-30: 4 mg via INTRAVENOUS

## 2014-04-30 MED ORDER — NEOSTIGMINE METHYLSULFATE 10 MG/10ML IV SOLN
INTRAVENOUS | Status: AC
  Filled 2014-04-30: qty 1

## 2014-04-30 MED ORDER — SODIUM CHLORIDE 0.9 % IJ SOLN
INTRAMUSCULAR | Status: AC
  Filled 2014-04-30: qty 20

## 2014-04-30 MED ORDER — GLYCOPYRROLATE 0.2 MG/ML IJ SOLN
INTRAMUSCULAR | Status: DC | PRN
  Administered 2014-04-30: 0.6 mg via INTRAVENOUS

## 2014-04-30 MED ORDER — PROMETHAZINE HCL 25 MG/ML IJ SOLN: 6.2500 mg | INTRAMUSCULAR | Status: DC | PRN

## 2014-04-30 MED ORDER — CEFAZOLIN SODIUM 1-5 GM-% IV SOLN
1.0000 g | Freq: Three times a day (TID) | INTRAVENOUS | Status: AC
  Administered 2014-04-30 – 2014-05-01 (×2): 1 g via INTRAVENOUS
  Filled 2014-04-30 (×2): qty 50

## 2014-04-30 MED ORDER — SUCCINYLCHOLINE CHLORIDE 20 MG/ML IJ SOLN
INTRAMUSCULAR | Status: DC | PRN
  Administered 2014-04-30: 100 mg via INTRAVENOUS

## 2014-04-30 MED ORDER — DOLUTEGRAVIR SODIUM 50 MG PO TABS
50.0000 mg | ORAL_TABLET | Freq: Every day | ORAL | Status: DC
  Administered 2014-05-01: 50 mg via ORAL
  Filled 2014-04-30 (×2): qty 1

## 2014-04-30 MED ORDER — CEFAZOLIN SODIUM-DEXTROSE 2-3 GM-% IV SOLR
INTRAVENOUS | Status: AC
  Filled 2014-04-30: qty 50

## 2014-04-30 MED ORDER — ABACAVIR SULFATE 300 MG PO TABS
600.0000 mg | ORAL_TABLET | Freq: Every day | ORAL | Status: DC
  Administered 2014-05-01: 600 mg via ORAL
  Filled 2014-04-30 (×2): qty 2

## 2014-04-30 MED ORDER — DOCUSATE SODIUM 100 MG PO CAPS
100.0000 mg | ORAL_CAPSULE | Freq: Two times a day (BID) | ORAL | Status: DC
  Administered 2014-04-30 – 2014-05-01 (×3): 100 mg via ORAL
  Filled 2014-04-30 (×6): qty 1

## 2014-04-30 MED ORDER — ATENOLOL 25 MG PO TABS
25.0000 mg | ORAL_TABLET | Freq: Every day | ORAL | Status: DC
  Administered 2014-05-01: 25 mg via ORAL
  Filled 2014-04-30 (×3): qty 1

## 2014-04-30 MED ORDER — ROCURONIUM BROMIDE 100 MG/10ML IV SOLN
INTRAVENOUS | Status: DC | PRN
  Administered 2014-04-30: 5 mg via INTRAVENOUS
  Administered 2014-04-30: 10 mg via INTRAVENOUS
  Administered 2014-04-30: 25 mg via INTRAVENOUS

## 2014-04-30 MED ORDER — LACTATED RINGERS IR SOLN
Status: DC | PRN
  Administered 2014-04-30: 1000 mL

## 2014-04-30 MED ORDER — FENTANYL CITRATE 0.05 MG/ML IJ SOLN
INTRAMUSCULAR | Status: AC
  Filled 2014-04-30: qty 5

## 2014-04-30 MED ORDER — DEXTROSE-NACL 5-0.45 % IV SOLN
INTRAVENOUS | Status: DC
  Administered 2014-04-30 – 2014-05-01 (×3): via INTRAVENOUS
  Administered 2014-05-01: 75 mL via INTRAVENOUS

## 2014-04-30 MED ORDER — MIDAZOLAM HCL 2 MG/2ML IJ SOLN
INTRAMUSCULAR | Status: AC
  Filled 2014-04-30: qty 2

## 2014-04-30 MED ORDER — LIDOCAINE HCL (CARDIAC) 20 MG/ML IV SOLN
INTRAVENOUS | Status: DC | PRN
  Administered 2014-04-30: 50 mg via INTRAVENOUS

## 2014-04-30 MED ORDER — PHENYLEPHRINE HCL 10 MG/ML IJ SOLN
INTRAMUSCULAR | Status: AC
  Filled 2014-04-30: qty 1

## 2014-04-30 MED ORDER — DEXAMETHASONE SODIUM PHOSPHATE 10 MG/ML IJ SOLN
INTRAMUSCULAR | Status: DC | PRN
  Administered 2014-04-30: 10 mg via INTRAVENOUS

## 2014-04-30 SURGICAL SUPPLY — 68 items
BAG ZIPLOCK 12X15 (MISCELLANEOUS) IMPLANT
BENZOIN TINCTURE PRP APPL 2/3 (GAUZE/BANDAGES/DRESSINGS) IMPLANT
BLADE EXTENDED COATED 6.5IN (ELECTRODE) ×2 IMPLANT
BLADE SURG SZ10 CARB STEEL (BLADE) ×2 IMPLANT
CANISTER SUCTION 2500CC (MISCELLANEOUS) IMPLANT
CHLORAPREP W/TINT 26ML (MISCELLANEOUS) ×2 IMPLANT
CLIP LIGATING HEM O LOK PURPLE (MISCELLANEOUS) ×4 IMPLANT
CLIP LIGATING HEMO LOK XL GOLD (MISCELLANEOUS) ×2 IMPLANT
CLIP LIGATING HEMO O LOK GREEN (MISCELLANEOUS) ×2 IMPLANT
COVER SURGICAL LIGHT HANDLE (MISCELLANEOUS) ×2 IMPLANT
CUTTER FLEX LINEAR 45M (STAPLE) ×2 IMPLANT
DECANTER SPIKE VIAL GLASS SM (MISCELLANEOUS) ×2 IMPLANT
DERMABOND ADVANCED (GAUZE/BANDAGES/DRESSINGS) ×1
DERMABOND ADVANCED .7 DNX12 (GAUZE/BANDAGES/DRESSINGS) ×1 IMPLANT
DRAIN CHANNEL 10F 3/8 F FF (DRAIN) IMPLANT
DRAPE CAMERA CLOSED 9X96 (DRAPES) IMPLANT
DRAPE INCISE IOBAN 66X45 STRL (DRAPES) ×2 IMPLANT
DRAPE LAPAROSCOPIC ABDOMINAL (DRAPES) ×2 IMPLANT
DRAPE WARM FLUID 44X44 (DRAPE) IMPLANT
DRSG TEGADERM 4X4.75 (GAUZE/BANDAGES/DRESSINGS) IMPLANT
ELECT REM PT RETURN 9FT ADLT (ELECTROSURGICAL) ×2
ELECTRODE REM PT RTRN 9FT ADLT (ELECTROSURGICAL) ×1 IMPLANT
EVACUATOR SILICONE 100CC (DRAIN) IMPLANT
FLOSEAL 10ML (HEMOSTASIS) IMPLANT
GAUZE SPONGE 4X4 12PLY STRL (GAUZE/BANDAGES/DRESSINGS) IMPLANT
GLOVE BIO SURGEON STRL SZ 6.5 (GLOVE) ×2 IMPLANT
GLOVE BIO SURGEON STRL SZ7 (GLOVE) ×2 IMPLANT
GLOVE BIOGEL M STRL SZ7.5 (GLOVE) ×2 IMPLANT
GLOVE BIOGEL PI IND STRL 6.5 (GLOVE) ×1 IMPLANT
GLOVE BIOGEL PI IND STRL 7.5 (GLOVE) ×2 IMPLANT
GLOVE BIOGEL PI INDICATOR 6.5 (GLOVE) ×1
GLOVE BIOGEL PI INDICATOR 7.5 (GLOVE) ×2
GOWN STRL REUS W/TWL LRG LVL3 (GOWN DISPOSABLE) ×6 IMPLANT
HEMOSTAT SURGICEL 4X8 (HEMOSTASIS) IMPLANT
KIT BASIN OR (CUSTOM PROCEDURE TRAY) ×2 IMPLANT
MANIFOLD NEPTUNE II (INSTRUMENTS) ×2 IMPLANT
PENCIL BUTTON HOLSTER BLD 10FT (ELECTRODE) ×2 IMPLANT
POSITIONER SURGICAL ARM (MISCELLANEOUS) ×2 IMPLANT
POUCH ENDO CATCH II 15MM (MISCELLANEOUS) ×2 IMPLANT
RELOAD 45 VASCULAR/THIN (ENDOMECHANICALS) ×4 IMPLANT
RETRACTOR LAPSCP 12X46 CVD (ENDOMECHANICALS) IMPLANT
RTRCTR LAPSCP 12X46 CVD (ENDOMECHANICALS)
SCISSORS LAP 5X35 DISP (ENDOMECHANICALS) IMPLANT
SET IRRIG TUBING LAPAROSCOPIC (IRRIGATION / IRRIGATOR) ×2 IMPLANT
SHEARS HARMONIC ACE PLUS 36CM (ENDOMECHANICALS) ×2 IMPLANT
SOLUTION ANTI FOG 6CC (MISCELLANEOUS) ×2 IMPLANT
SPONGE LAP 18X18 X RAY DECT (DISPOSABLE) IMPLANT
SPONGE LAP 4X18 X RAY DECT (DISPOSABLE) IMPLANT
SPONGE SURGIFOAM ABS GEL 100 (HEMOSTASIS) IMPLANT
STRIP CLOSURE SKIN 1/4X4 (GAUZE/BANDAGES/DRESSINGS) IMPLANT
SURGIFLO W/THROMBIN 8M KIT (HEMOSTASIS) IMPLANT
SUT ETHILON 3 0 PS 1 (SUTURE) IMPLANT
SUT MNCRL AB 4-0 PS2 18 (SUTURE) ×4 IMPLANT
SUT PDS AB 1 CTX 36 (SUTURE) ×4 IMPLANT
SUT VIC AB 2-0 SH 27 (SUTURE)
SUT VIC AB 2-0 SH 27X BRD (SUTURE) IMPLANT
SUT VICRYL 0 UR6 27IN ABS (SUTURE) ×4 IMPLANT
TOWEL OR 17X26 10 PK STRL BLUE (TOWEL DISPOSABLE) ×2 IMPLANT
TOWEL OR NON WOVEN STRL DISP B (DISPOSABLE) ×2 IMPLANT
TRAY FOLEY CATH 14FRSI W/METER (CATHETERS) ×2 IMPLANT
TRAY LAP CHOLE (CUSTOM PROCEDURE TRAY) IMPLANT
TRAY LAPAROSCOPIC (CUSTOM PROCEDURE TRAY) ×2 IMPLANT
TROCAR BLADELESS OPT 5 100 (ENDOMECHANICALS) ×2 IMPLANT
TROCAR BLADELESS OPT 5 75 (ENDOMECHANICALS) ×2 IMPLANT
TROCAR XCEL 12X100 BLDLESS (ENDOMECHANICALS) ×2 IMPLANT
TROCAR XCEL BLUNT TIP 100MML (ENDOMECHANICALS) ×2 IMPLANT
TUBING INSUFFLATION 10FT LAP (TUBING) ×2 IMPLANT
YANKAUER SUCT BULB TIP 10FT TU (MISCELLANEOUS) ×2 IMPLANT

## 2014-04-30 NOTE — Progress Notes (Signed)
RN received a return call from PCP on call. RN got a verbal order to order the [place] 600-30-300mg  oral tablets tonight.

## 2014-04-30 NOTE — Anesthesia Preprocedure Evaluation (Addendum)
Anesthesia Evaluation  Patient identified by MRN, date of birth, ID band Patient awake  General Assessment Comment:Hodgkin's disease. Recent chemotherapy including adriamycin and bleomycin. Recent ECHO and CXR normal.  Reviewed: Allergy & Precautions, H&P , NPO status , Patient's Chart, lab work & pertinent test results  Airway Mallampati: II TM Distance: >3 FB Neck ROM: Full    Dental  (+)    Pulmonary former smoker,  Quit smoking 09-21-13. Denies pulmonary symptoms. breath sounds clear to auscultation  Pulmonary exam normal       Cardiovascular Exercise Tolerance: Good hypertension, Pt. on home beta blockers Rhythm:Regular Rate:Normal     Neuro/Psych negative neurological ROS  negative psych ROS   GI/Hepatic negative GI ROS, Neg liver ROS,   Endo/Other  negative endocrine ROS  Renal/GU Renal InsufficiencyRenal diseaseCr 2.04 K 3.9  negative genitourinary   Musculoskeletal negative musculoskeletal ROS (+)   Abdominal   Peds negative pediatric ROS (+)  Hematology  (+) anemia , HIV,   Anesthesia Other Findings   Reproductive/Obstetrics negative OB ROS                        Anesthesia Physical Anesthesia Plan  ASA: III  Anesthesia Plan: General   Post-op Pain Management:    Induction: Intravenous  Airway Management Planned: Oral ETT  Additional Equipment:   Intra-op Plan:   Post-operative Plan: Extubation in OR  Informed Consent: I have reviewed the patients History and Physical, chart, labs and discussed the procedure including the risks, benefits and alternatives for the proposed anesthesia with the patient or authorized representative who has indicated his/her understanding and acceptance.   Dental advisory given  Plan Discussed with: CRNA  Anesthesia Plan Comments:         Anesthesia Quick Evaluation

## 2014-04-30 NOTE — Op Note (Signed)
Preoperative diagnosis: Right hydronephrosis, right ureteral stone, right non-functioning kidney, history of right pyelonephritis  Postoperative diagnosis: Same  Procedure: 1.  Right laparoscopic radical nephrectomy with partial ureterectomy 2. Removal of right nephrostomy tube  Surgeon: Dr. Dutch Gray  Resident: Milon Score, MD  Assistant(s): None  Anesthesia: General  Complications: None  EBL: 150 mL  IVF:  3000 mL crystalloid  Specimens: 1. Right kidney and ureter 2. Right ureteral stone  Disposition of specimens: Pathology (kidney, ureter); [facility] for chemical analysis (stone)  Indication: Thomas [name] is a 33 y.o. patient with a right non-functioning kidney due to a long standing distal ureteral stone. He has had difficulty with urinary tract infections/pyelonephritis requiring nephrostomy tube placement.  After a thorough review of the management options for their renal mass, they elected to proceed with surgical treatment and the above procedure.  We have discussed the potential benefits and risks of the procedure, side effects of the proposed treatment, the likelihood of the patient achieving the goals of the procedure, and any potential problems that might occur during the procedure or recuperation. Informed consent has been obtained.  Description of procedure:  The patient was taken to the operating room and a general anesthetic was administered. The patient was given preoperative antibiotics, placed in the right modified flank position, and prepped and draped in the usual sterile fashion. Next a preoperative timeout was performed.  A site was selected near the umbilicus for placement of the camera port. This was placed using a standard open Hassan technique which allowed entry into the peritoneal cavity under direct vision and without difficulty. A 12 mm Hassan cannula was placed and a pneumoperitoneum established. The camera was then used to inspect  the abdomen and there was no evidence of any intra-abdominal injuries or other abnormalities. The remaining abdominal ports were then placed. A 12 mm port was placed in the right lower quadrant and a 5 mm port was placed in the right upper quadrant. An additional 2mm port was placed for a liver retractor in the midline and superior to the 3mm working port. All ports were placed under direct vision without difficulty.  The liver was retracted superiorly and the attachments to the anterior surface of the kidney were taken down. Utilizing the harmonic scalpel, the white line of Toldt was incised allowing the colon to be mobilized medially and the plane between the mesocolon and the anterior layer of Gerota's fascia to be developed and the kidney exposed.  The ureter and gonadal vein were identified inferiorly and the ureter was lifted anteriorly off the psoas muscle.  Dissection proceeded superiorly along the gonadal vein until the renal vein was identified.  The renal hilum was then carefully isolated with a combination of blunt and sharp dissectiong allowing the renal arterial and venous structures to be separated and isolated. There were several branches of the vein that were stuck in inflammatory tissue which required clips to control in order to isolate the small artery. The renal artery was then isolated and ligated with multiple Weck clips and subsequently divided.  The remaining branches of the renal vein were then isolated and also ligated and divided with both a 45 mm Flex ETS stapler and multiple 10 and 15 mm Weck clips.  Gerota's fascia was intentionally entered superiorly and the space between the adrenal gland and the kidney was developed allowing the adrenal gland to be spared.  The hepatorenal ligaments were divided with the harmonic scalpel. The ureter was then traced  down to the level of the iliacs and was divided distal to the stone between 2 Weck clips. The lateral and posterior attachements to  the kidney were then divided, including the nephrostomy tube, which was removed.   The kidney specimen was then placed into a 15 mm Endocatch II retrieval bag.  The renal hilum, liver, adrenal bed and gonadal vein areas were each inspected and hemostasis was ensured with the pneomperitoneal pressures lowered.  The 12 mm lower quadrant port was then closed with a 0-vicryl suture placed laparoscopically to close the fascia of this incision. All remaining ports were removed under direct vision.  The kidney specimen was removed intact within the retrieval bag via the camera port site after this incision was extended slightly. This fascial opening was then closed with two #1 PDS sutures.    All incisions were injected with local anesthetic and reapproximated at the skin with 4-0 monocryl sutures.  Dermabond was applied to the skin. The patient tolerated the procedure well and without complications and was transferred to the recovery unit in satisfactory condition.   Dr. Alinda Money was present and scrubbed for the entire procedure.

## 2014-04-30 NOTE — Discharge Instructions (Signed)

## 2014-04-30 NOTE — Progress Notes (Signed)
Patient stated that he takes Triumeq 600-50-300 mg oral tablet at 2200 and the medication was not ordered here at the hospital. RN notified MD on call.  Awaiting a return call.

## 2014-04-30 NOTE — Transfer of Care (Signed)
Immediate Anesthesia Transfer of Care Note  Patient: Thomas [name]  Procedure(s) Performed: Procedure(s): LAPAROSCOPIC NEPHRECTOMY AND URETERECTOMY (Right)  Patient Location: PACU  Anesthesia Type:General  Level of Consciousness: awake, alert  and oriented  Airway & Oxygen Therapy: Patient Spontanous Breathing and Patient connected to face mask oxygen  Post-op Assessment: Report given to PACU RN and Post -op Vital signs reviewed and stable  Post vital signs: Reviewed and stable  Complications: No apparent anesthesia complications

## 2014-04-30 NOTE — Anesthesia Postprocedure Evaluation (Signed)
  Anesthesia Post-op Note  Patient: Thomas [name]  Procedure(s) Performed: Procedure(s) (LRB): LAPAROSCOPIC NEPHRECTOMY AND URETERECTOMY (Right)  Patient Location: PACU  Anesthesia Type: General  Level of Consciousness: awake and alert   Airway and Oxygen Therapy: Patient Spontanous Breathing  Post-op Pain: mild  Post-op Assessment: Post-op Vital signs reviewed, Patient's Cardiovascular Status Stable, Respiratory Function Stable, Patent Airway and No signs of Nausea or vomiting  Last Vitals:  Filed Vitals:   04/30/14 1515  BP: 101/57  Pulse: 72  Temp:   Resp: 17    Post-op Vital Signs: stable   Complications: No apparent anesthesia complications

## 2014-05-01 ENCOUNTER — Encounter (HOSPITAL_COMMUNITY): Payer: Self-pay | Admitting: Urology

## 2014-05-01 LAB — BASIC METABOLIC PANEL
ANION GAP: 11 (ref 5–15)
BUN: 20 mg/dL (ref 6–23)
CHLORIDE: 107 meq/L (ref 96–112)
CO2: 23 meq/L (ref 19–32)
CREATININE: 1.87 mg/dL — AB (ref 0.50–1.35)
Calcium: 8.2 mg/dL — ABNORMAL LOW (ref 8.4–10.5)
GFR calc Af Amer: 54 mL/min — ABNORMAL LOW (ref >90)
GFR calc non Af Amer: 46 mL/min — ABNORMAL LOW (ref >90)
Glucose, Bld: 124 mg/dL — ABNORMAL HIGH (ref 70–99)
Potassium: 4.1 mEq/L (ref 3.7–5.3)
Sodium: 141 mEq/L (ref 137–147)

## 2014-05-01 LAB — HEMOGLOBIN AND HEMATOCRIT, BLOOD
HCT: 30.4 % — ABNORMAL LOW (ref 39.0–52.0)
Hemoglobin: 10.2 g/dL — ABNORMAL LOW (ref 13.0–17.0)

## 2014-05-01 MED ORDER — BISACODYL 10 MG RE SUPP
10.0000 mg | Freq: Once | RECTAL | Status: AC
  Administered 2014-05-01: 10 mg via RECTAL
  Filled 2014-05-01: qty 1

## 2014-05-01 MED ORDER — OXYCODONE HCL 5 MG PO TABS: 10.0000 mg | ORAL_TABLET | ORAL | Status: DC | PRN

## 2014-05-01 MED ORDER — OXYCODONE HCL 5 MG PO TABS
5.0000 mg | ORAL_TABLET | ORAL | Status: DC | PRN
  Administered 2014-05-01 – 2014-05-02 (×5): 5 mg via ORAL
  Filled 2014-05-01 (×5): qty 1

## 2014-05-01 MED ORDER — ABACAVIR-DOLUTEGRAVIR-LAMIVUD 600-50-300 MG PO TABS: 1.0000 | ORAL_TABLET | Freq: Once | ORAL | Status: AC

## 2014-05-01 NOTE — Progress Notes (Signed)
Have encouraged pt to ambulate many times today. Pt refuses, saying that it "hurts too much" and that he will "walk with his mom when she is here". Pt has stood at bedside and has ambulated with his mom and family in hallway X one today. Have explained the benefits of ambulation and what can happen if pt continues to lie in bed. Will continue to encourage ambulation per MD order.

## 2014-05-01 NOTE — Progress Notes (Signed)
1 Day Post-Op Subjective: NAEON. Pain controlled with IV medicines so far. Tolerating clears without nausea/vomiting. Did not get OOB or ambulate yesterday. Denies fevers/chills.  Objective: Vital signs in last 24 hours: Temp:  [97.6 F (36.4 C)-98.4 F (36.9 C)] 98.4 F (36.9 C) (10/09 0516) Pulse Rate:  [48-81] 80 (10/09 0516) Resp:  [13-20] 18 (10/09 0516) BP: (92-109)/(41-70) 92/53 mmHg (10/09 0516) SpO2:  [80 %-100 %] 100 % (10/09 0516) Weight:  [67.671 kg (149 lb 3 oz)] 67.671 kg (149 lb 3 oz) (10/08 0946)  Intake/Output from previous day: 10/08 0701 - 10/09 0700 In: 6382.5 [P.O.:240; I.V.:6092.5; IV Piggyback:50] Out: 0998 [Urine:3030; Blood:100] Intake/Output this shift: Total I/O In: 1942.5 [P.O.:240; I.V.:1652.5; IV Piggyback:50] Out: 2800 [Urine:2800]  Physical Exam:  General: Alert and oriented CV: RRR Lungs: Clear Abdomen: Soft, ND Incisions:c/d/i, no evidence of infection Foley: clear yellow Ext: NT, No erythema  Lab Results:  Recent Labs  04/30/14 1433 05/01/14 0526  HGB 11.4* 10.2*  HCT 33.5* 30.4*   BMET  Recent Labs  04/30/14 1433 05/01/14 0526  NA 139 141  K 4.2 4.1  CL 105 107  CO2 23 23  GLUCOSE 113* 124*  BUN 21 20  CREATININE 1.63* 1.87*  CALCIUM 8.6 8.2*     Studies/Results: No results found.  Assessment/Plan: POD#1 from right lap nephrectomy with partial ureterectomy for non-functioning kidney secondary to chronic ureteral obstruction for ureteral stone. Doing well.  -- transition to PO oxycodone -- get OOB/ambulate -- wean off O2 -- D/C foley catheter -- Likely d/c today if pain controlled on PO, ambulating, and voiding    LOS: 1 day   Schuitema, Michaeljoseph Revolorio C 05/01/2014, 6:36 AM

## 2014-05-01 NOTE — Progress Notes (Signed)
Patient ID: Thomas [name], male   DOB: Jul 03, 1981, 33 y.o.   MRN: [id number]  Pt overall doing well.  However, he has not ambulated much today.  I reiterated the importance of walking for prevention of DVT and pneumonia.  He expressed his understanding and stated that he would comply.  Will SL IVF tonight.  He appears to be ready for discharge tomorrow if no issues overnight.

## 2014-05-01 NOTE — Progress Notes (Signed)
IS education done -- pt currently at 1250

## 2014-05-01 NOTE — Care Management Note (Signed)
    Page 1 of 1   05/01/2014     3:30:13 PM CARE MANAGEMENT NOTE 05/01/2014  Patient:  NKOSI, CORTRIGHT   Account Number:  [id number]  Date Initiated:  05/01/2014  Documentation initiated by:  [facility]  Subjective/Objective Assessment:   33 Y/O M ADMITTED W/R RENAL NEOPLASM.SW:109.     Action/Plan:   FROM HOME.   Anticipated DC Date:  05/01/2014   Anticipated DC Plan:  [place]  CM consult      Choice offered to / List presented to:             Status of service:  In process, will continue to follow Medicare Important Message given?   (If response is "NO", the following Medicare IM given date fields will be blank) Date Medicare IM given:   Medicare IM given by:   Date Additional Medicare IM given:   Additional Medicare IM given by:    Discharge Disposition:    Per UR Regulation:  Reviewed for med. necessity/level of care/duration of stay  If discussed at [place] of Stay Meetings, dates discussed:    Comments:  05/01/14 Masen Salvas RN,BSN NCM [phone] POD#1 R LAP RAD NEPHRECTOMY.NO ANTICIPATED D/C NEEDS.

## 2014-05-02 LAB — BASIC METABOLIC PANEL
Anion gap: 11 (ref 5–15)
BUN: 13 mg/dL (ref 6–23)
CO2: 25 mEq/L (ref 19–32)
Calcium: 9.1 mg/dL (ref 8.4–10.5)
Chloride: 106 mEq/L (ref 96–112)
Creatinine, Ser: 1.98 mg/dL — ABNORMAL HIGH (ref 0.50–1.35)
GFR calc Af Amer: 50 mL/min — ABNORMAL LOW (ref >90)
GFR, EST NON AFRICAN AMERICAN: 43 mL/min — AB (ref >90)
Glucose, Bld: 95 mg/dL (ref 70–99)
POTASSIUM: 4.1 meq/L (ref 3.7–5.3)
SODIUM: 142 meq/L (ref 137–147)

## 2014-05-02 NOTE — Discharge Summary (Signed)
Patient ID: LEQUAN DOBRATZ MRN: [id number] DOB/AGE: 23-Aug-1980 33 y.o.  Admit date: 04/30/2014 Discharge date: 05/02/2014  Primary Care Physician:  Philis Fendt, MD  Discharge Diagnoses:   Present on Admission:  . Renal neoplasm  Consults:  None     Discharge Medications:   Medication List         Abacavir-Dolutegravir-Lamivud 600-50-300 MG Tabs  Take 1 tablet by mouth at bedtime.     atenolol 25 MG tablet  Commonly known as:  TENORMIN  Take 25 mg by mouth daily.     calcium carbonate 500 MG chewable tablet  Commonly known as:  TUMS - dosed in mg elemental calcium  Chew 4 tablets by mouth 3 (three) times daily.     docusate sodium 100 MG capsule  Commonly known as:  COLACE  Take 1 capsule (100 mg total) by mouth 2 (two) times daily.     feeding supplement (ENSURE COMPLETE) Liqd  Take 237 mLs by mouth 3 (three) times daily between meals.     ferrous sulfate 325 (65 FE) MG tablet  Take 325 mg by mouth 2 (two) times daily with a meal.     oxyCODONE 5 MG immediate release tablet  Commonly known as:  ROXICODONE  Take 1 tablet (5 mg total) by mouth every 4 (four) hours as needed for severe pain.     prochlorperazine 10 MG tablet  Commonly known as:  COMPAZINE  Take 10 mg by mouth every 6 (six) hours as needed for nausea or vomiting.         Significant Diagnostic Studies:  No results found.  Brief H and P: For complete details please refer to admission H and P, but in brief the patient was admitted for surgical management of a chronically obstructed/atrophic left kidney.  Hospital Course:  Active Problems:   Renal neoplasm  The patient was admitted to the floor directly after his surgical nephrectomy. He did well postoperatively. There was no fever. His diet was advanced without difficulty. On the day of discharge, incisions were all healing well. He was having flatus. Day of Discharge BP 95/58  Pulse 99  Temp(Src) 98.7 F (37.1 C) (Oral)  Resp 18   Ht 6\' 1"  (1.854 m)  Wt 67.671 kg (149 lb 3 oz)  BMI 19.69 kg/m2  SpO2 100%  Results for orders placed during the hospital encounter of 04/30/14 (from the past 24 hour(s))  BASIC METABOLIC PANEL     Status: Abnormal   Collection Time    05/02/14  5:25 AM      Result Value Ref Range   Sodium 142  137 - 147 mEq/L   Potassium 4.1  3.7 - 5.3 mEq/L   Chloride 106  96 - 112 mEq/L   CO2 25  19 - 32 mEq/L   Glucose, Bld 95  70 - 99 mg/dL   BUN 13  6 - 23 mg/dL   Creatinine, Ser 1.98 (*) 0.50 - 1.35 mg/dL   Calcium 9.1  8.4 - 10.5 mg/dL   GFR calc non Af Amer 43 (*) >90 mL/min   GFR calc Af Amer 50 (*) >90 mL/min   Anion gap 11  5 - 15    Physical Exam: General: Alert and awake oriented x3 not in any acute distress. HEENT: anicteric sclera, pupils reactive to light and accommodation CVS: S1-S2 clear no murmur rubs or gallops Chest: clear to auscultation bilaterally, no wheezing rales or rhonchi Abdomen: soft nontender, nondistended, normal bowel sounds, no  organomegaly Extremities: no cyanosis, clubbing or edema noted bilaterally Neuro: Cranial nerves II-XII intact, no focal neurological deficits  Disposition:  Home  Diet:  No restrictions  Activity:  Gradually increase   Disposition and Follow-up:    Follow up with Dr. Alinda Money  TESTS THAT NEED FOLLOW-UP  Pathology review  DISCHARGE FOLLOW-UP     Follow-up Information   Follow up with Dutch Gray, MD. (05/26/14 at 10:30 AM)    Specialty:  Urology   Contact information:   [place] [address] [phone]       Time spent on Discharge:  10 minutes  Signed: Jorja Loa 05/02/2014, 9:09 AM

## 2014-05-15 ENCOUNTER — Encounter (HOSPITAL_COMMUNITY): Payer: Self-pay | Admitting: Emergency Medicine

## 2014-05-15 ENCOUNTER — Emergency Department (HOSPITAL_COMMUNITY): Payer: Medicare Other

## 2014-05-15 ENCOUNTER — Inpatient Hospital Stay (HOSPITAL_COMMUNITY): Payer: Medicare Other

## 2014-05-15 ENCOUNTER — Inpatient Hospital Stay (HOSPITAL_COMMUNITY)
Admission: EM | Admit: 2014-05-15 | Discharge: 2014-05-21 | DRG: 246 | Disposition: A | Discharge status: Home / Self Care | Payer: Medicare Other | Attending: Internal Medicine | Admitting: Internal Medicine

## 2014-05-15 ENCOUNTER — Encounter: Payer: Self-pay | Admitting: Pulmonary Disease

## 2014-05-15 DIAGNOSIS — A419 Sepsis, unspecified organism: Secondary | ICD-10-CM

## 2014-05-15 DIAGNOSIS — Z6823 Body mass index (BMI) 23.0-23.9, adult: Secondary | ICD-10-CM | POA: Diagnosis not present

## 2014-05-15 DIAGNOSIS — E872 Acidosis: Secondary | ICD-10-CM | POA: Diagnosis present

## 2014-05-15 DIAGNOSIS — R579 Shock, unspecified: Secondary | ICD-10-CM

## 2014-05-15 DIAGNOSIS — Z886 Allergy status to analgesic agent status: Secondary | ICD-10-CM

## 2014-05-15 DIAGNOSIS — N178 Other acute kidney failure: Secondary | ICD-10-CM | POA: Diagnosis not present

## 2014-05-15 DIAGNOSIS — E876 Hypokalemia: Secondary | ICD-10-CM | POA: Diagnosis not present

## 2014-05-15 DIAGNOSIS — Z0189 Encounter for other specified special examinations: Secondary | ICD-10-CM

## 2014-05-15 DIAGNOSIS — J9601 Acute respiratory failure with hypoxia: Secondary | ICD-10-CM | POA: Diagnosis present

## 2014-05-15 DIAGNOSIS — I469 Cardiac arrest, cause unspecified: Secondary | ICD-10-CM | POA: Diagnosis present

## 2014-05-15 DIAGNOSIS — R740 Nonspecific elevation of levels of transaminase and lactic acid dehydrogenase [LDH]: Secondary | ICD-10-CM

## 2014-05-15 DIAGNOSIS — N183 Chronic kidney disease, stage 3 (moderate): Secondary | ICD-10-CM | POA: Diagnosis present

## 2014-05-15 DIAGNOSIS — K7200 Acute and subacute hepatic failure without coma: Secondary | ICD-10-CM | POA: Diagnosis not present

## 2014-05-15 DIAGNOSIS — B20 Human immunodeficiency virus [HIV] disease: Secondary | ICD-10-CM | POA: Diagnosis present

## 2014-05-15 DIAGNOSIS — D72820 Lymphocytosis (symptomatic): Secondary | ICD-10-CM | POA: Diagnosis not present

## 2014-05-15 DIAGNOSIS — E43 Unspecified severe protein-calorie malnutrition: Secondary | ICD-10-CM | POA: Diagnosis present

## 2014-05-15 DIAGNOSIS — M79605 Pain in left leg: Secondary | ICD-10-CM | POA: Diagnosis present

## 2014-05-15 DIAGNOSIS — G9340 Encephalopathy, unspecified: Secondary | ICD-10-CM | POA: Diagnosis present

## 2014-05-15 DIAGNOSIS — M79606 Pain in leg, unspecified: Secondary | ICD-10-CM | POA: Diagnosis present

## 2014-05-15 DIAGNOSIS — C8190 Hodgkin lymphoma, unspecified, unspecified site: Secondary | ICD-10-CM | POA: Diagnosis present

## 2014-05-15 DIAGNOSIS — G8929 Other chronic pain: Secondary | ICD-10-CM | POA: Diagnosis present

## 2014-05-15 DIAGNOSIS — N200 Calculus of kidney: Secondary | ICD-10-CM | POA: Diagnosis not present

## 2014-05-15 DIAGNOSIS — I2510 Atherosclerotic heart disease of native coronary artery without angina pectoris: Secondary | ICD-10-CM | POA: Diagnosis present

## 2014-05-15 DIAGNOSIS — I4901 Ventricular fibrillation: Principal | ICD-10-CM | POA: Diagnosis present

## 2014-05-15 DIAGNOSIS — N179 Acute kidney failure, unspecified: Secondary | ICD-10-CM | POA: Diagnosis not present

## 2014-05-15 DIAGNOSIS — I214 Non-ST elevation (NSTEMI) myocardial infarction: Secondary | ICD-10-CM | POA: Diagnosis not present

## 2014-05-15 DIAGNOSIS — I272 Other secondary pulmonary hypertension: Secondary | ICD-10-CM | POA: Diagnosis present

## 2014-05-15 DIAGNOSIS — M79609 Pain in unspecified limb: Secondary | ICD-10-CM | POA: Diagnosis not present

## 2014-05-15 DIAGNOSIS — M79661 Pain in right lower leg: Secondary | ICD-10-CM | POA: Diagnosis not present

## 2014-05-15 DIAGNOSIS — J9690 Respiratory failure, unspecified, unspecified whether with hypoxia or hypercapnia: Secondary | ICD-10-CM

## 2014-05-15 DIAGNOSIS — J9600 Acute respiratory failure, unspecified whether with hypoxia or hypercapnia: Secondary | ICD-10-CM

## 2014-05-15 DIAGNOSIS — D631 Anemia in chronic kidney disease: Secondary | ICD-10-CM | POA: Diagnosis not present

## 2014-05-15 DIAGNOSIS — Z905 Acquired absence of kidney: Secondary | ICD-10-CM | POA: Diagnosis present

## 2014-05-15 DIAGNOSIS — I10 Essential (primary) hypertension: Secondary | ICD-10-CM | POA: Diagnosis not present

## 2014-05-15 DIAGNOSIS — N19 Unspecified kidney failure: Secondary | ICD-10-CM | POA: Diagnosis not present

## 2014-05-15 DIAGNOSIS — G931 Anoxic brain damage, not elsewhere classified: Secondary | ICD-10-CM | POA: Diagnosis present

## 2014-05-15 DIAGNOSIS — B961 Klebsiella pneumoniae [K. pneumoniae] as the cause of diseases classified elsewhere: Secondary | ICD-10-CM | POA: Diagnosis present

## 2014-05-15 DIAGNOSIS — R6521 Severe sepsis with septic shock: Secondary | ICD-10-CM | POA: Diagnosis not present

## 2014-05-15 DIAGNOSIS — D638 Anemia in other chronic diseases classified elsewhere: Secondary | ICD-10-CM | POA: Diagnosis present

## 2014-05-15 DIAGNOSIS — M79604 Pain in right leg: Secondary | ICD-10-CM

## 2014-05-15 DIAGNOSIS — Z87891 Personal history of nicotine dependence: Secondary | ICD-10-CM

## 2014-05-15 DIAGNOSIS — D6959 Other secondary thrombocytopenia: Secondary | ICD-10-CM | POA: Diagnosis present

## 2014-05-15 DIAGNOSIS — R739 Hyperglycemia, unspecified: Secondary | ICD-10-CM | POA: Diagnosis present

## 2014-05-15 DIAGNOSIS — R52 Pain, unspecified: Secondary | ICD-10-CM

## 2014-05-15 DIAGNOSIS — N390 Urinary tract infection, site not specified: Secondary | ICD-10-CM | POA: Diagnosis present

## 2014-05-15 DIAGNOSIS — I25110 Atherosclerotic heart disease of native coronary artery with unstable angina pectoris: Secondary | ICD-10-CM | POA: Diagnosis not present

## 2014-05-15 DIAGNOSIS — N189 Chronic kidney disease, unspecified: Secondary | ICD-10-CM | POA: Diagnosis present

## 2014-05-15 DIAGNOSIS — R803 Bence Jones proteinuria: Secondary | ICD-10-CM | POA: Diagnosis not present

## 2014-05-15 DIAGNOSIS — R7401 Elevation of levels of liver transaminase levels: Secondary | ICD-10-CM | POA: Diagnosis present

## 2014-05-15 DIAGNOSIS — D696 Thrombocytopenia, unspecified: Secondary | ICD-10-CM | POA: Diagnosis present

## 2014-05-15 DIAGNOSIS — R918 Other nonspecific abnormal finding of lung field: Secondary | ICD-10-CM | POA: Diagnosis not present

## 2014-05-15 DIAGNOSIS — E860 Dehydration: Secondary | ICD-10-CM | POA: Diagnosis present

## 2014-05-15 DIAGNOSIS — D649 Anemia, unspecified: Secondary | ICD-10-CM | POA: Diagnosis present

## 2014-05-15 DIAGNOSIS — J9811 Atelectasis: Secondary | ICD-10-CM | POA: Diagnosis not present

## 2014-05-15 DIAGNOSIS — Z4682 Encounter for fitting and adjustment of non-vascular catheter: Secondary | ICD-10-CM | POA: Diagnosis not present

## 2014-05-15 DIAGNOSIS — Z452 Encounter for adjustment and management of vascular access device: Secondary | ICD-10-CM | POA: Diagnosis not present

## 2014-05-15 HISTORY — DX: Chronic kidney disease, unspecified: N18.9

## 2014-05-15 HISTORY — DX: Disorder of kidney and ureter, unspecified: N28.9

## 2014-05-15 HISTORY — DX: Cardiac arrest, cause unspecified: I46.9

## 2014-05-15 LAB — COMPREHENSIVE METABOLIC PANEL
ALT: 73 U/L — ABNORMAL HIGH (ref 0–53)
AST: 96 U/L — AB (ref 0–37)
Albumin: 2.4 g/dL — ABNORMAL LOW (ref 3.5–5.2)
Alkaline Phosphatase: 173 U/L — ABNORMAL HIGH (ref 39–117)
Anion gap: 28 — ABNORMAL HIGH (ref 5–15)
BUN: 22 mg/dL (ref 6–23)
CALCIUM: 8.3 mg/dL — AB (ref 8.4–10.5)
CO2: 11 meq/L — AB (ref 19–32)
CREATININE: 2.14 mg/dL — AB (ref 0.50–1.35)
Chloride: 104 mEq/L (ref 96–112)
GFR calc Af Amer: 46 mL/min — ABNORMAL LOW (ref >90)
GFR, EST NON AFRICAN AMERICAN: 39 mL/min — AB (ref >90)
GLUCOSE: 367 mg/dL — AB (ref 70–99)
Potassium: 4.1 mEq/L (ref 3.7–5.3)
Sodium: 143 mEq/L (ref 137–147)
Total Protein: 6.2 g/dL (ref 6.0–8.3)

## 2014-05-15 LAB — TROPONIN I: TROPONIN I: 19.25 ng/mL — AB (ref <0.30)

## 2014-05-15 LAB — I-STAT CHEM 8, ED
BUN: 26 mg/dL — ABNORMAL HIGH (ref 6–23)
Calcium, Ion: 1.1 mmol/L — ABNORMAL LOW (ref 1.12–1.23)
Chloride: 110 mEq/L (ref 96–112)
Creatinine, Ser: 2.2 mg/dL — ABNORMAL HIGH (ref 0.50–1.35)
Glucose, Bld: 363 mg/dL — ABNORMAL HIGH (ref 70–99)
HCT: 39 % (ref 39.0–52.0)
HEMOGLOBIN: 13.3 g/dL (ref 13.0–17.0)
POTASSIUM: 3.7 meq/L (ref 3.7–5.3)
SODIUM: 140 meq/L (ref 137–147)
TCO2: 13 mmol/L (ref 0–100)

## 2014-05-15 LAB — URINE MICROSCOPIC-ADD ON

## 2014-05-15 LAB — I-STAT ARTERIAL BLOOD GAS, ED
Acid-base deficit: 18 mmol/L — ABNORMAL HIGH (ref 0.0–2.0)
Bicarbonate: 13.3 mEq/L — ABNORMAL LOW (ref 20.0–24.0)
O2 Saturation: 98 %
PCO2 ART: 51.5 mmHg — AB (ref 35.0–45.0)
PH ART: 7.019 — AB (ref 7.350–7.450)
TCO2: 15 mmol/L (ref 0–100)
pO2, Arterial: 151 mmHg — ABNORMAL HIGH (ref 80.0–100.0)

## 2014-05-15 LAB — CBC WITH DIFFERENTIAL/PLATELET
BASOS ABS: 0 10*3/uL (ref 0.0–0.1)
Basophils Relative: 0 % (ref 0–1)
Eosinophils Absolute: 0.2 10*3/uL (ref 0.0–0.7)
Eosinophils Relative: 1 % (ref 0–5)
HCT: 38.1 % — ABNORMAL LOW (ref 39.0–52.0)
Hemoglobin: 12.5 g/dL — ABNORMAL LOW (ref 13.0–17.0)
LYMPHS PCT: 51 % — AB (ref 12–46)
Lymphs Abs: 9.2 10*3/uL — ABNORMAL HIGH (ref 0.7–4.0)
MCH: 34.2 pg — ABNORMAL HIGH (ref 26.0–34.0)
MCHC: 32.8 g/dL (ref 30.0–36.0)
MCV: 104.4 fL — ABNORMAL HIGH (ref 78.0–100.0)
MONOS PCT: 4 % (ref 3–12)
Monocytes Absolute: 0.7 10*3/uL (ref 0.1–1.0)
NEUTROS PCT: 44 % (ref 43–77)
Neutro Abs: 7.9 10*3/uL — ABNORMAL HIGH (ref 1.7–7.7)
PLATELETS: 206 10*3/uL (ref 150–400)
RBC: 3.65 MIL/uL — ABNORMAL LOW (ref 4.22–5.81)
RDW: 13.2 % (ref 11.5–15.5)
WBC: 18 10*3/uL — AB (ref 4.0–10.5)

## 2014-05-15 LAB — PHOSPHORUS: Phosphorus: 9.2 mg/dL — ABNORMAL HIGH (ref 2.3–4.6)

## 2014-05-15 LAB — CBG MONITORING, ED
Glucose-Capillary: 83 mg/dL (ref 70–99)
Glucose-Capillary: 151 mg/dL — ABNORMAL HIGH (ref 70–99)

## 2014-05-15 LAB — URINALYSIS, ROUTINE W REFLEX MICROSCOPIC
Bilirubin Urine: NEGATIVE
GLUCOSE, UA: 500 mg/dL — AB
KETONES UR: NEGATIVE mg/dL
Nitrite: NEGATIVE
Protein, ur: >300 mg/dL — AB
Specific Gravity, Urine: 1.016 (ref 1.005–1.030)
Urobilinogen, UA: 0.2 mg/dL (ref 0.0–1.0)
pH: 7 (ref 5.0–8.0)

## 2014-05-15 LAB — POCT I-STAT 3, ART BLOOD GAS (G3+)
Acid-base deficit: 10 mmol/L — ABNORMAL HIGH (ref 0.0–2.0)
Bicarbonate: 16.8 mEq/L — ABNORMAL LOW (ref 20.0–24.0)
O2 Saturation: 97 %
PCO2 ART: 37.6 mmHg (ref 35.0–45.0)
TCO2: 18 mmol/L (ref 0–100)
pH, Arterial: 7.254 — ABNORMAL LOW (ref 7.350–7.450)
pO2, Arterial: 102 mmHg — ABNORMAL HIGH (ref 80.0–100.0)

## 2014-05-15 LAB — GLUCOSE, CAPILLARY: GLUCOSE-CAPILLARY: 292 mg/dL — AB (ref 70–99)

## 2014-05-15 LAB — PROTIME-INR
INR: 1.39 (ref 0.00–1.49)
Prothrombin Time: 17.2 seconds — ABNORMAL HIGH (ref 11.6–15.2)

## 2014-05-15 LAB — MAGNESIUM: MAGNESIUM: 2.2 mg/dL (ref 1.5–2.5)

## 2014-05-15 LAB — PATHOLOGIST SMEAR REVIEW

## 2014-05-15 LAB — I-STAT CG4 LACTIC ACID, ED: Lactic Acid, Venous: 12.72 mmol/L — ABNORMAL HIGH (ref 0.5–2.2)

## 2014-05-15 LAB — MRSA PCR SCREENING: MRSA BY PCR: NEGATIVE

## 2014-05-15 MED ORDER — AMIODARONE HCL IN DEXTROSE 360-4.14 MG/200ML-% IV SOLN
30.0000 mg/h | INTRAVENOUS | Status: DC
  Administered 2014-05-15 – 2014-05-17 (×4): 30 mg/h via INTRAVENOUS
  Filled 2014-05-15 (×8): qty 200

## 2014-05-15 MED ORDER — FENTANYL CITRATE 0.05 MG/ML IJ SOLN
100.0000 ug | INTRAMUSCULAR | Status: DC | PRN
  Administered 2014-05-15 (×2): 100 ug via INTRAVENOUS
  Filled 2014-05-15 (×2): qty 2

## 2014-05-15 MED ORDER — VANCOMYCIN HCL IN DEXTROSE 1-5 GM/200ML-% IV SOLN
1000.0000 mg | Freq: Once | INTRAVENOUS | Status: AC
  Administered 2014-05-15: 1000 mg via INTRAVENOUS
  Filled 2014-05-15: qty 200

## 2014-05-15 MED ORDER — AMIODARONE HCL IN DEXTROSE 360-4.14 MG/200ML-% IV SOLN
60.0000 mg/h | Freq: Once | INTRAVENOUS | Status: AC
  Administered 2014-05-15: 60 mg/h via INTRAVENOUS

## 2014-05-15 MED ORDER — SODIUM CHLORIDE 0.9 % IV SOLN
INTRAVENOUS | Status: DC
  Administered 2014-05-15: 08:00:00 via INTRAVENOUS

## 2014-05-15 MED ORDER — SODIUM CHLORIDE 0.9 % IV BOLUS (SEPSIS)
500.0000 mL | Freq: Once | INTRAVENOUS | Status: AC
  Administered 2014-05-15: 500 mL via INTRAVENOUS

## 2014-05-15 MED ORDER — FENTANYL CITRATE 0.05 MG/ML IJ SOLN
10.0000 ug/h | INTRAMUSCULAR | Status: DC
  Filled 2014-05-15: qty 50

## 2014-05-15 MED ORDER — ROCURONIUM BROMIDE 50 MG/5ML IV SOLN
INTRAVENOUS | Status: AC | PRN
  Administered 2014-05-15: 80 mg via INTRAVENOUS

## 2014-05-15 MED ORDER — ABACAVIR-DOLUTEGRAVIR-LAMIVUD 600-50-300 MG PO TABS
1.0000 | ORAL_TABLET | Freq: Every day | ORAL | Status: DC
  Administered 2014-05-16: 1
  Administered 2014-05-17: 11:00:00
  Administered 2014-05-18 – 2014-05-21 (×4): 1
  Filled 2014-05-15: qty 1

## 2014-05-15 MED ORDER — FENTANYL CITRATE 0.05 MG/ML IJ SOLN: 50.0000 ug | Freq: Once | INTRAMUSCULAR | Status: AC

## 2014-05-15 MED ORDER — EPINEPHRINE HCL 0.1 MG/ML IJ SOSY
PREFILLED_SYRINGE | INTRAMUSCULAR | Status: AC | PRN
  Administered 2014-05-15: 1 mg via INTRAVENOUS

## 2014-05-15 MED ORDER — DOCUSATE SODIUM 50 MG/5ML PO LIQD
100.0000 mg | Freq: Two times a day (BID) | ORAL | Status: DC | PRN
  Filled 2014-05-15: qty 10

## 2014-05-15 MED ORDER — NOREPINEPHRINE BITARTRATE 1 MG/ML IV SOLN
0.0000 ug/min | INTRAVENOUS | Status: DC
  Administered 2014-05-15: 5 ug/min via INTRAVENOUS
  Administered 2014-05-15: 30 ug/min via INTRAVENOUS
  Filled 2014-05-15 (×2): qty 16

## 2014-05-15 MED ORDER — SODIUM BICARBONATE 8.4 % IV SOLN
INTRAVENOUS | Status: AC | PRN
  Administered 2014-05-15: 50 meq via INTRAVENOUS

## 2014-05-15 MED ORDER — STERILE WATER FOR INJECTION IV SOLN
INTRAVENOUS | Status: DC
  Administered 2014-05-15: 18:00:00 via INTRAVENOUS
  Filled 2014-05-15 (×2): qty 850

## 2014-05-15 MED ORDER — CETYLPYRIDINIUM CHLORIDE 0.05 % MT LIQD
7.0000 mL | Freq: Four times a day (QID) | OROMUCOSAL | Status: DC
  Administered 2014-05-15 – 2014-05-17 (×8): 7 mL via OROMUCOSAL

## 2014-05-15 MED ORDER — AMIODARONE HCL IN DEXTROSE 360-4.14 MG/200ML-% IV SOLN
INTRAVENOUS | Status: AC
  Filled 2014-05-15: qty 200

## 2014-05-15 MED ORDER — FENTANYL BOLUS VIA INFUSION
50.0000 ug | INTRAVENOUS | Status: DC | PRN
  Filled 2014-05-15: qty 100

## 2014-05-15 MED ORDER — MIDAZOLAM HCL 2 MG/2ML IJ SOLN: 2.0000 mg | INTRAMUSCULAR | Status: DC | PRN

## 2014-05-15 MED ORDER — PANTOPRAZOLE SODIUM 40 MG IV SOLR
40.0000 mg | INTRAVENOUS | Status: DC
  Administered 2014-05-15 – 2014-05-16 (×2): 40 mg via INTRAVENOUS
  Filled 2014-05-15 (×4): qty 40

## 2014-05-15 MED ORDER — PIPERACILLIN-TAZOBACTAM 3.375 G IVPB 30 MIN
3.3750 g | Freq: Once | INTRAVENOUS | Status: AC
  Administered 2014-05-15: 3.375 g via INTRAVENOUS
  Filled 2014-05-15: qty 50

## 2014-05-15 MED ORDER — SODIUM CHLORIDE 0.9 % IV SOLN
0.0000 ug/h | INTRAVENOUS | Status: DC
  Administered 2014-05-15: 50 ug/h via INTRAVENOUS
  Filled 2014-05-15 (×2): qty 50

## 2014-05-15 MED ORDER — SODIUM CHLORIDE 0.9 % IV BOLUS (SEPSIS)
1000.0000 mL | Freq: Once | INTRAVENOUS | Status: AC
  Administered 2014-05-15: 1000 mL via INTRAVENOUS

## 2014-05-15 MED ORDER — FENTANYL CITRATE 0.05 MG/ML IJ SOLN: 100.0000 ug | INTRAMUSCULAR | Status: DC | PRN

## 2014-05-15 MED ORDER — ETOMIDATE 2 MG/ML IV SOLN
INTRAVENOUS | Status: AC | PRN
  Administered 2014-05-15: 20 mg via INTRAVENOUS

## 2014-05-15 MED ORDER — CHLORHEXIDINE GLUCONATE 0.12 % MT SOLN
15.0000 mL | Freq: Two times a day (BID) | OROMUCOSAL | Status: DC
  Administered 2014-05-15 – 2014-05-16 (×4): 15 mL via OROMUCOSAL
  Filled 2014-05-15 (×5): qty 15

## 2014-05-15 MED ORDER — AMIODARONE HCL IN DEXTROSE 360-4.14 MG/200ML-% IV SOLN
60.0000 mg/h | INTRAVENOUS | Status: AC
  Administered 2014-05-15: 08:00:00 via INTRAVENOUS
  Filled 2014-05-15: qty 200

## 2014-05-15 MED ORDER — FENTANYL CITRATE 0.05 MG/ML IJ SOLN
25.0000 ug | INTRAMUSCULAR | Status: DC | PRN
  Administered 2014-05-15 (×2): 100 ug via INTRAVENOUS
  Filled 2014-05-15 (×2): qty 2

## 2014-05-15 MED ORDER — HOME MED STORE IN PYXIS: 1.0000 | Freq: Every day | Status: DC

## 2014-05-15 MED FILL — Medication: Qty: 1 | Status: AC

## 2014-05-15 NOTE — Progress Notes (Addendum)
PULMONARY / CRITICAL CARE MEDICINE   Name: Thomas [name] MRN: [id number] DOB: [birth year]    ADMISSION DATE:  05/15/2014  CHIEF COMPLAINT:  Cardiac arrest  INITIAL PRESENTATION:  33 yo male former smoker noted to have seizure like activity at home.  On arrival by EMS pt was in VT/VF/PEA.  Approximately 1 hour for ROSC.  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  PMH: HIV, Stage IIIA Hodgkin's disease s/p ABVD  STUDIES:  10/23 CT head: NAD   SIGNIFICANT EVENTS: 10/23 Admit, cardiac arrest, palliative care consulted   SUBJECTIVE:  Intermittent agitation requiring sedation/analgesia  VITAL SIGNS: Temp:  [93.4 F (34.1 C)-93.9 F (34.4 C)] 93.9 F (34.4 C) (10/23 0719) Pulse Rate:  [84-95] 95 (10/23 0630) Resp:  [14-22] 22 (10/23 0719) BP: (85-104)/(61-82) 85/61 mmHg (10/23 0715) SpO2:  [100 %] 100 % (10/23 0630) FiO2 (%):  [60 %] 60 % (10/23 0800) Weight:  [69.2 kg (152 lb 8.9 oz)-73.029 kg (161 lb)] 69.2 kg (152 lb 8.9 oz) (10/23 0745) HEMODYNAMICS:   VENTILATOR SETTINGS: Vent Mode:  [-] PRVC FiO2 (%):  [60 %] 60 % Set Rate:  [14 bmp-22 bmp] 22 bmp Vt Set:  [600 mL-630 mL] 600 mL PEEP:  [5 cmH20] 5 cmH20 Plateau Pressure:  [10 cmH20-19 cmH20] 19 cmH20 INTAKE / OUTPUT:  Intake/Output Summary (Last 24 hours) at 05/15/14 1001 Last data filed at 05/15/14 0724  Gross per 24 hour  Intake      0 ml  Output    165 ml  Net   -165 ml    PHYSICAL EXAMINATION: General: RASS -3, -4. Not F/C Neuro:  MAEs HEENT: NCAT, C -collar Cardiovascular: Tachycardic, Regular, no murmur Lungs:  Scattered rhonchi Abdomen:  Soft, healing abdominal surgical wounds w/o drainage Ext:  No edema  LABS:  CBC  Recent Labs Lab 05/15/14 0558 05/15/14 0624  WBC 18.0*  --   HGB 12.5* 13.3  HCT 38.1* 39.0  PLT 206  --    Coag's  Recent Labs Lab 05/15/14 0558  INR 1.39    BMET  Recent Labs Lab 05/15/14 0558 05/15/14 0624  NA 143 140  K 4.1  3.7  CL 104 110  CO2 11*  --   BUN 22 26*  CREATININE 2.14* 2.20*  GLUCOSE 367* 363*   Electrolytes  Recent Labs Lab 05/15/14 0558  CALCIUM 8.3*  MG 2.2  PHOS 9.2*    Sepsis Markers  Recent Labs Lab 05/15/14 0624  LATICACIDVEN 12.72*   ABG  Recent Labs Lab 05/15/14 0631  PHART 7.019*  PCO2ART 51.5*  PO2ART 151.0*   Liver Enzymes  Recent Labs Lab 05/15/14 0558  AST 96*  ALT 73*  ALKPHOS 173*  BILITOT <0.2*  ALBUMIN 2.4*    Cardiac Enzymes No results found for this basename: TROPONINI, PROBNP,  in the last 168 hours  Glucose  Recent Labs Lab 05/15/14 0525 05/15/14 0550  GLUCAP 83 151*    CXR: NACPD     ASSESSMENT / PLAN:  PULMONARY ETT 10/23 >>  A: Acute respiratory failure post cardiac arrest. Hx of smoking. Chest x ray shows bilateral airspace disease  P:   Cont full vent support - settings reviewed and/or adjusted Cont vent bundle Daily SBT if/when meets criteria  CARDIOVASCULAR L [place] CVL 10/23 >>  A:  Prolonged cardiac arrest - VT/VF/PEA  Positive troponins - NSTEMI. Unclear if primary or secondary phenomenon P:  Monitor hemodynamics Cont amiodarone Cont to cycle cardiac  markers Check Echocardiogram Not presently candidate for aggressive Cards eval  RENAL A:   AKI, nonoliguric CKD s/p Rt nephrectomy Severe lactic acidosis Hyperphosphatemia P:   Monitor BMET intermittently Monitor I/Os Correct electrolytes as indicated Change IVFs to HCO3 gtt Recheck lactate AM 10/24  GASTROINTESTINAL A:   No issues P:   SUP: IV PPI Consider TFs 10/24  HEMATOLOGIC A:   Hx of Hodkin's disease. P:  Trend lactate SQ heparin and SCD for DVT Trend CBC  INFECTIOUS A:   HIV Leukocytosis without overt infectious cause Hypothermia, resolved P:   Monitor off Abx  Blood Cx 10/23 >>  ENDOCRINE A:   Stress induced hyperglycemia P:   Cont SSI  NEUROLOGIC A:   Post anoxic encephalopathy  P:   RASS goal:  -1/-2 Further eval to be determined once hemodynamically and clinically stabilized   FAMILY  - Updates: No family available on my assessment on 10/23.  - Inter-disciplinary family meet or Palliative Care meeting due by:   TODAY'S SUMMARY:    35 mins CCM time  Merton Border, MD ; [facility] service Mobile [phone].  After 5:30 PM or weekends, call [phone]

## 2014-05-15 NOTE — Progress Notes (Signed)
[facility] Progress Note Patient Name: Thomas [name] DOB: 1981-02-11 MRN: [id number]   Date of Service  05/15/2014  HPI/Events of Note  Persistent hypotension, shock  eICU Interventions  N[place], A-line ordered     Intervention Category Major Interventions: Shock - evaluation and management  Merton Border 05/15/2014, 5:59 PM

## 2014-05-15 NOTE — Progress Notes (Signed)
Pt transported to and from CT without incident ?

## 2014-05-15 NOTE — Procedures (Signed)
Arterial Catheter Insertion Procedure Note QAMAR AUGHENBAUGH [id number] 1981-06-03  Procedure: Insertion of Arterial Catheter  Indications: Blood pressure monitoring  Procedure Details Consent: Unable to obtain consent because of emergent medical necessity. Time Out: Verified patient identification, verified procedure, site/side was marked, verified correct patient position, special equipment/implants available, medications/allergies/relevent history reviewed, required imaging and test results available.  Performed  Maximum sterile technique was used including antiseptics. Skin prep: Chlorhexidine; local anesthetic administered 20 gauge catheter was inserted into right radial artery using the Seldinger technique.  Evaluation Blood flow good; BP tracing good. Complications: No apparent complications.   Revonda Standard 05/15/2014

## 2014-05-15 NOTE — Code Documentation (Signed)
Dr. Randal Buba spoke w/ critical care and pt is not a candidate for hypothermia protocol d/t extended down time.

## 2014-05-15 NOTE — ED Notes (Signed)
Io left in leg per request of icu staff.

## 2014-05-15 NOTE — H&P (Signed)
PULMONARY / CRITICAL CARE MEDICINE   Name: Thomas [name] MRN: [id number] DOB: 1980-08-05    ADMISSION DATE:  05/15/2014  REFERRING MD :  Randal Buba  CHIEF COMPLAINT:  Cardiac arrest  INITIAL PRESENTATION:  33 yo male former smoker noted to have seizure like activity at home.  On arrival by EMS pt was in VT/VF/PEA.  Approximately 1 hour for ROSC.  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  He has hx of Stage IIIA Hodgkin's disease s/p ABVD, HIV  STUDIES:  10/23 CT head >>  SIGNIFICANT EVENTS: 10/23 Admit, cardiac arrest, palliative care consulted   HISTORY OF PRESENT ILLNESS:   Hx obtained from chart.  Pt was noted to have seizure like activity at home at about 445 am.  EMS called >> pt pulseless and apneic.  VT was initial rhythm, then VF.  He had defibrillation x 5 with ROSC briefly, then VF and PEA >> another defibrillation x 5.  He was in PEA on arrival in ER.  He had King airway changed to ETT.  He was given amiodarone.  He eventually had ROSC in ER.  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  He has hx of HIV, and is followed in ID clinic.  He was treated in August 2015 for MSSA bacteremia from [place] infection.  He has hx of Hodgkin's disease and was on chemotherapy with ABVD, but had failed to follow up with oncology since July 2015.  PAST MEDICAL HISTORY :   has a past medical history of HIV disease (06/25/2012); Skin lesion (07/03/2012); Leg pain (07/03/2012); Sinus tachycardia (08/06/2012); Penile cyst (08/06/2012); History of blood transfusion (july 2015 per pt); and Cancer.  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 Prior to Admission medications   Medication Sig Start Date End Date Taking? Authorizing Provider  Abacavir-Dolutegravir-Lamivud [id number] MG TABS Take 1 tablet by mouth at bedtime.   Yes Historical Provider, MD  atenolol (TENORMIN) 25 MG tablet Take 25 mg by mouth daily.   Yes Historical Provider, MD  calcium carbonate (TUMS - DOSED IN MG ELEMENTAL CALCIUM) 500 MG chewable tablet Chew 4 tablets by mouth 3 (three) times daily.   Yes Historical Provider, MD  docusate sodium (COLACE) 100 MG capsule Take 1 capsule (100 mg total) by mouth 2 (two) times daily. 04/30/14  Yes Raynelle Bring, MD  feeding supplement, ENSURE COMPLETE, (ENSURE COMPLETE) LIQD Take 237 mLs by mouth 3 (three) times daily between meals.   Yes Historical Provider, MD  ferrous sulfate 325 (65 FE) MG tablet Take 325 mg by mouth 2 (two) times daily with a meal.   Yes Historical Provider, MD  oxyCODONE (ROXICODONE) 5 MG immediate release tablet Take 1 tablet (5 mg total) by mouth every 4 (four) hours as needed for severe pain. 04/30/14  Yes Raynelle Bring, MD  prochlorperazine (COMPAZINE) 10 MG tablet Take 10 mg by mouth every 6 (six) hours as needed for nausea or vomiting.   Yes Historical Provider, MD   Allergies  Allergen Reactions  . Tylenol [Acetaminophen]     sweats    FAMILY HISTORY:  has no family status information on file.  SOCIAL HISTORY:  reports that he quit smoking about  7 months ago. His smoking use included Cigarettes. He started smoking about 8 months ago. He has a 15 pack-year smoking history. He has never used smokeless tobacco. He reports that he does not drink alcohol or use illicit drugs.  REVIEW OF SYSTEMS:   Unable to obtain  SUBJECTIVE:   VITAL SIGNS: Temp:  [93.4 F (34.1 C)] 93.4 F (34.1 C) (10/23 0615) Pulse Rate:  [84-93] 93 (10/23 0615) Resp:  [14-17] 14 (10/23 0615) BP: (98-104)/(62-68) 104/68 mmHg (10/23 0615) SpO2:  [100 %] 100 % (10/23 0615) FiO2 (%):  [60 %] 60 % (10/23  0615) Weight:  [161 lb (73.029 kg)] 161 lb (73.029 kg) (10/23 0540) HEMODYNAMICS:   VENTILATOR SETTINGS: Vent Mode:  [-]  FiO2 (%):  [60 %] 60 % Set Rate:  [14 bmp-22 bmp] 22 bmp Vt Set:  [600 mL-630 mL] 630 mL PEEP:  [5 cmH20] 5 cmH20 Plateau Pressure:  [10 cmH20] 10 cmH20 INTAKE / OUTPUT:  Intake/Output Summary (Last 24 hours) at 05/15/14 4098 Last data filed at 05/15/14 0616  Gross per 24 hour  Intake      0 ml  Output     15 ml  Net    -15 ml    PHYSICAL EXAMINATION: General: Ill appearing Neuro:  Comatose HEENT:  Pupils dilated, ETT in place Cardiovascular:  Regular, no murmur Lungs:  Scattered rhonchi Abdomen:  Soft, healing abdominal surgical wounds w/o drainage Musculoskeletal:  No edema Skin:  No rashes  LABS:  CBC  Recent Labs Lab 05/15/14 0558 05/15/14 0624  WBC 18.0*  --   HGB 12.5* 13.3  HCT 38.1* 39.0  PLT 206  --    Coag's No results found for this basename: APTT, INR,  in the last 168 hours  BMET  Recent Labs Lab 05/15/14 0624  NA 140  K 3.7  CL 110  BUN 26*  CREATININE 2.20*  GLUCOSE 363*   Electrolytes No results found for this basename: CALCIUM, MG, PHOS,  in the last 168 hours  Sepsis Markers  Recent Labs Lab 05/15/14 0624  LATICACIDVEN 12.72*   ABG  Recent Labs Lab 05/15/14 0631  PHART 7.019*  PCO2ART 51.5*  PO2ART 151.0*   Liver Enzymes No results found for this basename: AST, ALT, ALKPHOS, BILITOT, ALBUMIN,  in the last 168 hours  Cardiac Enzymes No results found for this basename: TROPONINI, PROBNP,  in the last 168 hours  Glucose  Recent Labs Lab 05/15/14 0525 05/15/14 0550  GLUCAP 83 151*    Imaging Dg Chest Port 1 View  05/15/2014   CLINICAL DATA:  Cardiac arrest.  Cancer patient HIV.  EXAM: PORTABLE CHEST - 1 VIEW  COMPARISON:  02/23/2014  FINDINGS: Endotracheal tube tip measures 3.7 cm above the carinal. Shallow inspiration. Heart size and pulmonary vascularity are normal. Perihilar  infiltration suggesting pneumonia or edema. No blunting of costophrenic angles. No pneumothorax.  IMPRESSION: Endotracheal tube tip measures 3.7 cm above the carina. Bilateral perihilar infiltrates. Shallow inspiration.   Electronically Signed   By: Lucienne Capers M.D.   On: 05/15/2014 06:29      ASSESSMENT / PLAN:  PULMONARY OETT 10/23 >>  A: Acute respiratory failure 2nd to cardiac arrest. Hx of smoking. P:   Full vent support  F/u CXR, ABG  CARDIOVASCULAR A:  VT/VF/PEA cardiac arrest with prolonged downtime >> no candidate for hypothermia. P:  Continue IV fluids Monitor hemodynamics Place CVL, add pressors if blood pressure is low Defer cardiology evaluation until neuro status further  assessed  RENAL A:   CKD s/p Rt nephrectomy. Metabolic acidosis. P:   Continue IV fluids F/u BMET, ABG  GASTROINTESTINAL A:   Nutrition. P:   NPO Protonix for SUP Tube feeds if unable to extubate soon  HEMATOLOGIC A:   Hx of Hodkin's disease. P:  SCD for DVT prevention pending CT head F/u CBC  INFECTIOUS A:   Hx of HIV. No obvious current infection. P:   Monitor off Abx  Blood Cx 10/23 >>  ENDOCRINE A:   No acute issues.   P:   Monitor blood sugar on BMET  NEUROLOGIC A:   Acute encephalopathy 2nd to cardiac arrest with prolonged time before ROSC. P:   RASS goal: 0 PRN fentanyl F/u CT head, EEG >> might need neurology evaluation   FAMILY  - Updates: No family available on my assessment on 10/23.  - Inter-disciplinary family meet or Palliative Care meeting due by:   TODAY'S SUMMARY:  S/p cardiac arrest with prolonged time before ROSC.  Will need to have further neuro assessment with CT head and EEG.  Will consult palliative care to assist with family discussions.  CC time 50 minutes.  Chesley Mires, MD [facility] Pulmonary/Critical Care 05/15/2014, 7:01 AM Pager:  [phone] After 3pm call: [phone]

## 2014-05-15 NOTE — Progress Notes (Signed)
Met with pt's parents.  Pt's mother was with him during the night.  When she returned from work he was c/o nausea.  She gave him a nausea pill (?zofran) and this helped.  He later c/o feeling very hot and sore in his chest.  She then heard a thump and found him laying on the floor shaking.  She immediately called 911, and thinks it was about 10 minutes before EMS arrived >> CPR was started by EMS.  I explained to them that we will have further neuro assessment with CT head and EEG.  Depending on these results will then decide additional interventions.  I explained to them that there is a high likelihood that he has sustained significant brain injury that he might not be able to recover from.  Additional CC time 35 minutes.  Chesley Mires, MD [facility] Pulmonary/Critical Care 05/15/2014, 7:15 AM Pager:  [phone] After 3pm call: [phone]

## 2014-05-15 NOTE — Progress Notes (Signed)
EEG Completed; Results Pending  

## 2014-05-15 NOTE — Progress Notes (Signed)
05/15/14 0700  Clinical Encounter Type  Visited With Patient and family together;Health care provider  Visit Type Initial;Spiritual support;Critical Care;ED  Stress Factors  Family Stress Factors Health changes;Lack of knowledge;Major life changes   Chaplain received a page from the ED at 5:22AM. Chaplain was notified that the patient was post-CPR and the family was in a consultation room waiting to hear from a physician. Chaplain introduced himself to patient's mother and father. Chaplain was present while one of the ED physicians updated patient's parents on the condition of the patient.  After this consultation the mother of the patient asked for prayer. Chaplain prayed with patient's parents. Prayer is very important to patient's mother. Patient's mother explained that over the past several months the patient has had multiple health complications. Since then, patient's mother moved from [place] to [place] to care for her son. Patient's mother witnessed the patient's cardiac arrest. Patient's mother is strongly supportive of patient and will likely be with him consistently. Chaplain was present with the patient's family while they were updated by a critical care physician. Patient's parents are still at bedside in the ED and waiting for the transfer of the patient to one of the ICU units. Chaplain will notify spiritual care department and suggest that another chaplain provide follow up care for family. Ileane Sando, Claudius Sis, Chaplain 7:32 AM

## 2014-05-15 NOTE — Procedures (Signed)
Central Venous Catheter Insertion Procedure Note AULDEN CALISE [id number] March 10, 1981  Procedure: Insertion of Central Venous Catheter Indications: Drug and/or fluid administration  Procedure Details Consent: Unable to obtain consent because of altered level of consciousness. Time Out: Verified patient identification, verified procedure, site/side was marked, verified correct patient position, special equipment/implants available, medications/allergies/relevent history reviewed, required imaging and test results available.  Performed  Maximum sterile technique was used including antiseptics, cap, gloves, gown, hand hygiene, mask and sheet. Skin prep: Chlorhexidine; local anesthetic administered A antimicrobial bonded/coated triple lumen catheter was placed in the left subclavian vein using the Seldinger technique.  Evaluation Blood flow good Complications: No apparent complications Patient did tolerate procedure well. Chest X-ray ordered to verify placement.  CXR: pending.  BABCOCK,PETE 05/15/2014, 11:10 AM  I was present for and supervised the entire procedure  Merton Border, MD ; [facility] [phone].  After 5:30 PM or weekends, call [phone]

## 2014-05-15 NOTE — Care Management Note (Addendum)
  Page 2 of 2   05/20/2014     3:16:09 PM CARE MANAGEMENT NOTE 05/20/2014  Patient:  Thomas [name], Thomas [name]   Account Number:  [id number]  Date Initiated:  05/15/2014  Documentation initiated by:  Elissa Hefty  Subjective/Objective Assessment:   adm w arrest-vent     Action/Plan:   lives w fam, pcp dr Remigio Eisenmenger, hx ahc   Anticipated DC Date:  05/21/2014   Anticipated DC Plan:  [place]  In-house referral  Hospice / [place]  CM consult      [facility] Choice  HOME HEALTH   Choice offered to / List presented to:  C-1 Patient        [place] arranged  HH-1 RN  [place].   Status of service:  Completed, signed off Medicare Important Message given?  YES (If response is "NO", the following Medicare IM given date fields will be blank) Date Medicare IM given:  05/18/2014 Medicare IM given by:  Elissa Hefty Date Additional Medicare IM given:  05/20/2014 Additional Medicare IM given by:  Edwin Baines  Discharge Disposition:  [place]  Per UR Regulation:  Reviewed for med. necessity/level of care/duration of stay  If discussed at [place] of Stay Meetings, dates discussed:   05/21/2014    Comments:  Elfreida Heggs RN, BSN, MSHL, CCM  Nurse - Case Manager,  (Unit [phone]  10/28/2015Hx/o Non-Hodgkins Lymphoma, HIV, urolithiasis Left heart catheterization procedure 05/18/14 (Med Review: ticagrelor (BRILINTA) tablet 90 mg 2 times daily); Benefits check (MCD) Social:  Home with family.  Patient denies any PCS services at this time. Hospice/Palliative Consult cancelled 05/18/2014 (See Dr. Forde Dandy Note) HHS:  Hx/o past services with [facility] and elects the same for any needed services. PT RECS:  PT / 24 hour supervision Dispo Plan:  Home with HHS:  RN/PT ([facility]/[place] notified)

## 2014-05-15 NOTE — ED Notes (Signed)
RT note: Pt. transported to 40m15 from ED Trauma-C, uneventfully, RT to monitor.

## 2014-05-15 NOTE — ED Notes (Signed)
Lactic acid results given to Dr. Jamie Kato

## 2014-05-15 NOTE — Progress Notes (Addendum)
ABG results given to [place], [place] at [phone] and to [place] at [phone]. Vent changes made per order, see Doc Flowsheets

## 2014-05-15 NOTE — ED Notes (Signed)
Per [facility] - pt w/ seizure-like activity at home approx 0445 - EMS found pt pulseless and apneic in a v-tach rhythm pt shocked and then in coarse v-fib after x5 shocks pt w/ ROSC and then w/ refractory v-fib and then PEA - pt tubed w/ a king airway - a total of x5 defibrillations, x10 epi, x2 doses of amio for a total of 450mg . Pt in PEA and pulseless on arrival to department.

## 2014-05-15 NOTE — ED Provider Notes (Signed)
CSN: [id number]     Arrival date & time 05/15/14  6962 History   First MD Initiated Contact with Patient 05/15/14 0534     Chief Complaint  Patient presents with  . Cardiac Arrest     (Consider location/radiation/quality/duration/timing/severity/associated sxs/prior Treatment) Patient is a 33 y.o. male presenting with altered mental status. The history is provided by the EMS personnel and a relative. The history is limited by the condition of the patient (unresponsive in cardiac arrest).  Altered Mental Status Presenting symptoms: unresponsiveness   Severity:  Severe Most recent episode:  Today Episode history:  Single Duration:  1 hour Timing:  Constant Progression:  Unchanged Chronicity:  New Context: recent illness   Context comment:  Cardiac arrest post op from nephrectomy on 10/8 Associated symptoms: no fever     Past Medical History  Diagnosis Date  . HIV disease 06/25/2012  . Skin lesion 07/03/2012  . Leg pain 07/03/2012  . Sinus tachycardia 08/06/2012  . Penile cyst 08/06/2012  . History of blood transfusion july 2015 per pt  . Cancer     hodgkins, no current treatment for   Past Surgical History  Procedure Laterality Date  . 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  this after several previous I and Ds of rectal abcess.   [name] node biopsy Right 08/08/2013    Procedure: SUPRACLAVICULAR LYMPH NODE BIOPSY;  Surgeon: Gaye Pollack, MD;  Location: [facility];  Service: Thoracic;  Laterality: Right;  . 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 Tee without cardioversion N/A 02/27/2014    Procedure: TRANSESOPHAGEAL ECHOCARDIOGRAM (TEE);  Surgeon: Pixie Casino, MD;  Location: [facility];  Service: Cardiovascular;  Laterality: N/A;  . Pac placed and later removed    . Right nephrostomy tube  for last month and 1/2  . Laparoscopic nephrectomy Right 04/30/2014    Procedure: LAPAROSCOPIC NEPHRECTOMY AND  URETERECTOMY;  Surgeon: Raynelle Bring, MD;  Location: [facility];  Service: Urology;  Laterality: Right;   History reviewed. No pertinent family history. History  Substance Use Topics  . Smoking status: Former Smoker -- 1.00 packs/day for 15 years    Types: Cigarettes    Start date: 08/24/2013    Quit date: 09/21/2013  . Smokeless tobacco: Never Used     Comment: quitline info, counseling  . Alcohol Use: No    Review of Systems  Unable to perform ROS Constitutional: Negative for fever.      Allergies  Tylenol  Home Medications   Prior to Admission medications   Medication Sig Start Date End Date Taking? Authorizing Provider  Abacavir-Dolutegravir-Lamivud [id number] MG TABS Take 1 tablet by mouth at bedtime.   Yes Historical Provider, MD  atenolol (TENORMIN) 25 MG tablet Take 25 mg by mouth daily.   Yes Historical Provider, MD  calcium carbonate (TUMS - DOSED IN MG ELEMENTAL CALCIUM) 500 MG chewable tablet Chew 4 tablets by mouth 3 (three) times daily.   Yes Historical Provider, MD  docusate sodium (COLACE) 100 MG capsule Take 1 capsule (100 mg total) by mouth 2 (two) times daily. 04/30/14  Yes Raynelle Bring, MD  feeding supplement, ENSURE COMPLETE, (ENSURE COMPLETE) LIQD Take 237 mLs by mouth 3 (three) times daily between meals.   Yes Historical Provider, MD  ferrous sulfate 325 (65 FE) MG tablet Take 325 mg by mouth 2 (two) times daily with a meal.   Yes Historical Provider, MD  oxyCODONE (ROXICODONE) 5  MG immediate release tablet Take 1 tablet (5 mg total) by mouth every 4 (four) hours as needed for severe pain. 04/30/14  Yes Raynelle Bring, MD  prochlorperazine (COMPAZINE) 10 MG tablet Take 10 mg by mouth every 6 (six) hours as needed for nausea or vomiting.   Yes Historical Provider, MD   BP 98/62  Pulse 84  Resp 17  Wt 161 lb (73.029 kg)  SpO2 100% Physical Exam  Constitutional: He appears well-developed.  HENT:  Head: Normocephalic and atraumatic.  Right Ear: External ear  normal.  Left Ear: External ear normal.  Mouth/Throat: Oropharynx is clear and moist.  Eyes: Conjunctivae are normal.  Pinpoint pupils B  Neck: No tracheal deviation present.  Cardiovascular: Normal rate, regular rhythm and intact distal pulses.   Pulmonary/Chest: He has rales.  Abdominal: He exhibits no distension.  Musculoskeletal: He exhibits no edema.  Cord in the RLE  Neurological: He has normal reflexes. He is unresponsive. He displays no seizure activity. GCS eye subscore is 1. GCS verbal subscore is 1. GCS motor subscore is 1.  Skin: Skin is dry. He is not diaphoretic.  Cool and pale  Psychiatric:  unable    ED Course  Procedures (including critical care time) Labs Review Labs Reviewed  CBG MONITORING, ED - Abnormal; Notable for the following:    Glucose-Capillary 151 (*)    All other components within normal limits  CULTURE, BLOOD (ROUTINE X 2)  CULTURE, BLOOD (ROUTINE X 2)  CBC WITH DIFFERENTIAL  PROTIME-INR  URINALYSIS, ROUTINE W REFLEX MICROSCOPIC  BLOOD GAS, ARTERIAL  I-STAT CHEM 8, ED  I-STAT CREATININE, ED  I-STAT CG4 LACTIC ACID, ED  CBG MONITORING, ED    Imaging Review No results found.   EKG Interpretation   Date/Time:  Friday May 15 2014 05:47:47 EDT Ventricular Rate:  102 PR Interval:  165 QRS Duration: 106 QT Interval:  360 QTC Calculation: 469 R Axis:   46 Text Interpretation:  Sinus tachycardia RSR' in V1 or V2, probably normal  variant Confirmed by [facility]  MD, Winnifred Dufford ([id number]) on 05/15/2014  6:06:39 AM      MDM   Final diagnoses:  Cardiac arrest    INTUBATION Performed by: Carlisle Beers  Required items: required blood products, implants, devices, and special equipment available Patient identity confirmed: provided demographic data and hospital-assigned identification number Time out: Immediately prior to procedure a "time out" was called to verify the correct patient, procedure, equipment, support staff and  site/side marked as required.  Indications: unresponsive  Intubation method: Glidescope Laryngoscopy   Preoxygenation: BVM  Sedatives: 20 mg Etomidate Paralytic: 80 mg rocuronium  Tube Size: 7.0 cuffed  Post-procedure assessment: chest rise and ETCO2 monitor Breath sounds: equal and absent over the epigastrium Tube secured with: ETT holder Chest x-ray interpreted by radiologist and me.  Chest x-ray findings: endotracheal tube in appropriate position  Patient tolerated the procedure well with no immediate complications.   [facility]   Code Blue CONSULT NOTE  Chief Complaint: Cardiac arrest/unresponsive   Level V Caveat: Unresponsive  History of present illness: I was contacted by the hospital for a CODE BLUE cardiac arrest upstairs and presented to the patient's bedside.    ROS: Unable to obtain, Level V caveat  Scheduled Meds: . antiseptic oral rinse  7 mL Mouth Rinse QID  . chlorhexidine  15 mL Mouth Rinse BID  . pantoprazole (PROTONIX) IV  40 mg Intravenous Q24H   Continuous Infusions: .  sodium chloride    . sodium chloride     PRN Meds:.fentaNYL, fentaNYL Past Medical History  Diagnosis Date  . HIV disease 06/25/2012  . Skin lesion 07/03/2012  . Leg pain 07/03/2012  . Sinus tachycardia 08/06/2012  . Penile cyst 08/06/2012  . History of blood transfusion july 2015 per pt  . Cancer     hodgkins, no current treatment for   Past Surgical History  Procedure Laterality Date  . 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  this after several previous I and Ds of rectal abcess.   [name] node biopsy Right 08/08/2013    Procedure: SUPRACLAVICULAR LYMPH NODE BIOPSY;  Surgeon: Gaye Pollack, MD;  Location: [facility];  Service: Thoracic;  Laterality: Right;  . 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 Tee without cardioversion N/A 02/27/2014    Procedure:  TRANSESOPHAGEAL ECHOCARDIOGRAM (TEE);  Surgeon: Pixie Casino, MD;  Location: [facility];  Service: Cardiovascular;  Laterality: N/A;  . Pac placed and later removed    . Right nephrostomy tube  for last month and 1/2  . Laparoscopic nephrectomy Right 04/30/2014    Procedure: LAPAROSCOPIC NEPHRECTOMY AND URETERECTOMY;  Surgeon: Raynelle Bring, MD;  Location: [facility];  Service: Urology;  Laterality: Right;   History   Social History  . Marital Status: Single    Spouse Name: N/A    Number of Children: N/A  . Years of Education: N/A   Occupational History  . Not on file.   Social History Main Topics  . Smoking status: Former Smoker -- 1.00 packs/day for 15 years    Types: Cigarettes    Start date: 08/24/2013    Quit date: 09/21/2013  . Smokeless tobacco: Never Used     Comment: quitline info, counseling  . Alcohol Use: No  . Drug Use: No     Comment: hasn't smoked marijuana in "a while".   . Sexual Activity: Not Currently    Partners: Female    Birth Control/ Protection: Condom     Comment: declined condoms   Other Topics Concern  . Not on file   Social History Narrative  . No narrative on file   Allergies  Allergen Reactions  . Tylenol [Acetaminophen]     sweats    Last set of Vital Signs (not current) Filed Vitals:   05/15/14 0640  BP: 88/62  Pulse:   Temp: 93.7 F (34.3 C)  Resp: 22      First exam Physical Exam  Gen: unresponsive Cardiovascular: pulseless  Resp: apneic. Breath sounds equal bilaterally with bagging  Abd: nondistended  Neuro: GCS 3, unresponsive to pain  HEENT: No blood in posterior pharynx, gag reflex absent  Neck: No crepitus  Musculoskeletal: No deformity  Skin: warm   CRITICAL CARE Performed by: Carlisle Beers Total critical care time: 61 minutes Critical care time was exclusive of separately billable procedures and treating other patients. Critical care was necessary to treat or prevent imminent or life-threatening  deterioration. Critical care was time spent personally by me on the following activities: development of treatment plan with patient and/or surrogate as well as nursing, discussions with consultants, evaluation of patient's response to treatment, examination of patient, obtaining history from patient or surrogate, ordering and performing treatments and interventions, ordering and review of laboratory studies, ordering and review of radiographic studies, pulse oximetry and re-evaluation of patient's condition.  Cardiopulmonary Resuscitation (CPR) Procedure Note Directed/Performed by: Carlisle Beers  I personally directed ancillary staff and/or performed CPR in an effort to regain return of spontaneous circulation and to maintain cardiac, neuro and systemic perfusion.  Unresponsive pulseless in PEA on arrival given EPI continued on [place] CPR machine, bicarb and no cardiac activity.  CPR ceased and patient has spontaneous return to circulation.   See EMS note for their code summary.    Medical Decision making  Give h/o renal cell carcinoma with recent surgery suspect PE.     Assessment and Plan  Case d/w Dr. Glorious Peach given length of CPR do not start arctic sun Intubated.   Sedation ordered.   OGT placed.   MDM Number of Diagnoses or Management Options Cardiac arrest:  Critical Care Total time providing critical care: 30-74 minutes MDM Reviewed: nursing note, vitals and previous chart Interpretation: labs, ECG and x-ray (elevated lactate) Total time providing critical care: 30-74 minutes. This excludes time spent performing separately reportable procedures and services. Consults: pulmonary  CRITICAL CARE Performed by: Carlisle Beers Total critical care time: 61 minutes Critical care time was exclusive of separately billable procedures and treating other patients. Critical care was necessary to treat or prevent imminent or life-threatening deterioration. Critical care was  time spent personally by me on the following activities: development of treatment plan with patient and/or surrogate as well as nursing, discussions with consultants, evaluation of patient's response to treatment, examination of patient, obtaining history from patient or surrogate, ordering and performing treatments and interventions, ordering and review of laboratory studies, ordering and review of radiographic studies, pulse oximetry and re-evaluation of patient's condition.       Carlisle Beers, MD 05/15/14 [phone]

## 2014-05-16 ENCOUNTER — Inpatient Hospital Stay (HOSPITAL_COMMUNITY): Payer: Medicare Other

## 2014-05-16 DIAGNOSIS — I469 Cardiac arrest, cause unspecified: Secondary | ICD-10-CM

## 2014-05-16 DIAGNOSIS — R6521 Severe sepsis with septic shock: Secondary | ICD-10-CM

## 2014-05-16 DIAGNOSIS — A419 Sepsis, unspecified organism: Secondary | ICD-10-CM

## 2014-05-16 DIAGNOSIS — J9600 Acute respiratory failure, unspecified whether with hypoxia or hypercapnia: Secondary | ICD-10-CM

## 2014-05-16 LAB — COMPREHENSIVE METABOLIC PANEL
ALBUMIN: 2.7 g/dL — AB (ref 3.5–5.2)
ALT: 57 U/L — AB (ref 0–53)
AST: 112 U/L — AB (ref 0–37)
Alkaline Phosphatase: 149 U/L — ABNORMAL HIGH (ref 39–117)
Anion gap: 12 (ref 5–15)
BILIRUBIN TOTAL: 0.3 mg/dL (ref 0.3–1.2)
BUN: 26 mg/dL — ABNORMAL HIGH (ref 6–23)
CO2: 21 mEq/L (ref 19–32)
CREATININE: 2.34 mg/dL — AB (ref 0.50–1.35)
Calcium: 8.5 mg/dL (ref 8.4–10.5)
Chloride: 107 mEq/L (ref 96–112)
GFR calc Af Amer: 41 mL/min — ABNORMAL LOW (ref >90)
GFR calc non Af Amer: 35 mL/min — ABNORMAL LOW (ref >90)
Glucose, Bld: 112 mg/dL — ABNORMAL HIGH (ref 70–99)
Potassium: 3.9 mEq/L (ref 3.7–5.3)
SODIUM: 140 meq/L (ref 137–147)
Total Protein: 6.5 g/dL (ref 6.0–8.3)

## 2014-05-16 LAB — CBC
HCT: 34.9 % — ABNORMAL LOW (ref 39.0–52.0)
Hemoglobin: 12 g/dL — ABNORMAL LOW (ref 13.0–17.0)
MCH: 34 pg (ref 26.0–34.0)
MCHC: 34.4 g/dL (ref 30.0–36.0)
MCV: 98.9 fL (ref 78.0–100.0)
Platelets: 162 10*3/uL (ref 150–400)
RBC: 3.53 MIL/uL — AB (ref 4.22–5.81)
RDW: 13.5 % (ref 11.5–15.5)
WBC: 8.5 10*3/uL (ref 4.0–10.5)

## 2014-05-16 LAB — TROPONIN I
Troponin I: >20 ng/mL (ref <0.30)
Troponin I: >20 ng/mL (ref <0.30)
Troponin I: >20 ng/mL (ref <0.30)

## 2014-05-16 LAB — BLOOD GAS, ARTERIAL
ACID-BASE DEFICIT: 5.6 mmol/L — AB (ref 0.0–2.0)
Bicarbonate: 19 mEq/L — ABNORMAL LOW (ref 20.0–24.0)
DRAWN BY: 39866
FIO2: 0.4 %
LHR: 15 {breaths}/min
O2 SAT: 94.7 %
PATIENT TEMPERATURE: 98.9
PEEP/CPAP: 5 cmH2O
TCO2: 20.1 mmol/L (ref 0–100)
VT: 600 mL
pCO2 arterial: 35.5 mmHg (ref 35.0–45.0)
pH, Arterial: 7.349 — ABNORMAL LOW (ref 7.350–7.450)
pO2, Arterial: 79.9 mmHg — ABNORMAL LOW (ref 80.0–100.0)

## 2014-05-16 LAB — LACTIC ACID, PLASMA
Lactic Acid, Venous: 1.3 mmol/L (ref 0.5–2.2)
Lactic Acid, Venous: 1.1 mmol/L (ref 0.5–2.2)

## 2014-05-16 LAB — CLOSTRIDIUM DIFFICILE BY PCR: CDIFFPCR: NEGATIVE

## 2014-05-16 LAB — CORTISOL: CORTISOL PLASMA: 16 ug/dL

## 2014-05-16 MED ORDER — SODIUM CHLORIDE 0.9 % IV BOLUS (SEPSIS)
1000.0000 mL | Freq: Once | INTRAVENOUS | Status: AC
  Administered 2014-05-16: 1000 mL via INTRAVENOUS

## 2014-05-16 MED ORDER — ONDANSETRON HCL 4 MG/2ML IJ SOLN
4.0000 mg | Freq: Four times a day (QID) | INTRAMUSCULAR | Status: DC | PRN
  Administered 2014-05-16: 4 mg via INTRAVENOUS
  Filled 2014-05-16: qty 2

## 2014-05-16 MED ORDER — DEXTROSE-NACL 5-0.45 % IV SOLN
INTRAVENOUS | Status: DC
Start: 2014-05-16 — End: 2014-05-18
  Administered 2014-05-16 – 2014-05-17 (×2): via INTRAVENOUS

## 2014-05-16 MED ORDER — SODIUM CHLORIDE 0.9 % IV BOLUS (SEPSIS)
750.0000 mL | Freq: Once | INTRAVENOUS | Status: AC
Start: 2014-05-16 — End: 2014-05-16
  Administered 2014-05-16: 750 mL via INTRAVENOUS

## 2014-05-16 MED ORDER — FENTANYL CITRATE 0.05 MG/ML IJ SOLN
25.0000 ug | INTRAMUSCULAR | Status: DC | PRN
Start: 2014-05-16 — End: 2014-05-17
  Administered 2014-05-16: 25 ug via INTRAVENOUS
  Filled 2014-05-16: qty 2

## 2014-05-16 NOTE — Progress Notes (Signed)
  Echocardiogram 2D Echocardiogram has been performed.  Thomas [name] 05/16/2014, 5:38 PM

## 2014-05-16 NOTE — Consult Note (Signed)
CARDIOLOGY CONSULT NOTE  Patient ID: Thomas [name] MRN: [id number] DOB/AGE: 10/24/1980 33 y.o.  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 He has hx of Stage IIIA Hodgkin's disease s/p ABVD. His mother his medical history, states that on the day of admission at 4 AM he was having frequent episodes of nausea and vomiting and at one time she heard a loud moan, went to the patient's bedroom and found him on his stomach with convulsions like activity that lasted for about  Few seconds followed by total unresponsiveness leading to EMS activation.  The EMS arrived at the scene, he was found to be in what appears to be VF arrest  Where he was defibrillated on developed 3-4 times.  On arrival to the emergency room he was in pulseless electrical activity, and he was attempted to be resuscitated for almost an hour and finally patient returned to spontaneous circulation and breathing.  Patient was intubated and this morning was extubated as he was following all commands and was appropriate to verbal stimuli.  He was hypotensive and as stated with inotropes.  Patient CVP was found to be very low this morning,and has received fluid boluses leading to complete withdrawal of inotropic support.  Patient prior to passing out, had been having severe nausea frequent episodes of vomiting and after he threw up several times did complain of chest pain.  Patient presently states that he does not remember any of this but responds appropriately.  His mother was next to him states that he is presently doing well and undergoing chemotherapy and does go out to his friend's house, walking in the driveway, but does not have any significant sports like  activity.he quit smoking about 6 months ago, does not use any illicit drugs or drink alcohol.   Past Medical History  Diagnosis Date  . HIV disease 06/25/2012  . Skin lesion 07/03/2012  . Leg pain 07/03/2012  . Sinus tachycardia 08/06/2012  . Penile cyst 08/06/2012  . History of blood transfusion july 2015 per pt  . Cancer     hodgkins, no current treatment for     Past Surgical History  Procedure Laterality Date  . 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  this after several previous I and Ds of rectal abcess.   [name] node biopsy Right 08/08/2013    Procedure: SUPRACLAVICULAR LYMPH NODE BIOPSY;  Surgeon: Gaye Pollack, MD;  Location: [facility];  Service: Thoracic;  Laterality: Right;  . 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 Tee without cardioversion N/A 02/27/2014    Procedure: TRANSESOPHAGEAL ECHOCARDIOGRAM (TEE);  Surgeon: Pixie Casino, MD;  Location: [facility];  Service: Cardiovascular;  Laterality: N/A;  . Pac placed and later removed    . Right nephrostomy tube  for last month and 1/2  . Laparoscopic nephrectomy Right 04/30/2014    Procedure: LAPAROSCOPIC NEPHRECTOMY AND URETERECTOMY;  Surgeon: Raynelle Bring, MD;  Location: [facility];  Service: Urology;  Laterality: Right;     History reviewed. No pertinent family history.   Social History: History   Social History  . Marital Status: Single    Spouse Name: N/A    Number of Children: N/A  .  Years of Education: N/A   Occupational History  . Not on file.   Social History Main Topics  . Smoking status: Former Smoker -- 1.00 packs/day for 15 years    Types: Cigarettes    Start date: 08/24/2013    Quit date: 09/21/2013  . Smokeless tobacco: Never Used     Comment: quitline info, counseling  . Alcohol Use: No  . Drug Use: No     Comment: hasn't smoked marijuana in "a while".   . Sexual Activity: Not Currently    Partners: Female    Birth Control/  Protection: Condom     Comment: declined condoms   Other Topics Concern  . Not on file   Social History Narrative  . No narrative on file     Prescriptions prior to admission  Medication Sig Dispense Refill  . Abacavir-Dolutegravir-Lamivud 600-50-300 MG TABS Take 1 tablet by mouth at bedtime.      Marland Kitchen atenolol (TENORMIN) 25 MG tablet Take 25 mg by mouth daily.      . calcium carbonate (TUMS - DOSED IN MG ELEMENTAL CALCIUM) 500 MG chewable tablet Chew 4 tablets by mouth 3 (three) times daily.      Marland Kitchen docusate sodium (COLACE) 100 MG capsule Take 1 capsule (100 mg total) by mouth 2 (two) times daily.  30 capsule  0  . feeding supplement, ENSURE COMPLETE, (ENSURE COMPLETE) LIQD Take 237 mLs by mouth 3 (three) times daily between meals.      . ferrous sulfate 325 (65 FE) MG tablet Take 325 mg by mouth 2 (two) times daily with a meal.      . oxyCODONE (ROXICODONE) 5 MG immediate release tablet Take 1 tablet (5 mg total) by mouth every 4 (four) hours as needed for severe pain.  30 tablet  0  . prochlorperazine (COMPAZINE) 10 MG tablet Take 10 mg by mouth every 6 (six) hours as needed for nausea or vomiting.        Scheduled Meds: . Abacavir-Dolutegravir-Lamivud  1 tablet Per Tube Daily  . antiseptic oral rinse  7 mL Mouth Rinse QID  . chlorhexidine  15 mL Mouth Rinse BID  . pantoprazole (PROTONIX) IV  40 mg Intravenous Q24H   Continuous Infusions: . amiodarone 30 mg/hr (05/16/14 0900)  . dextrose 5 % and 0.45% NaCl 125 mL/hr at 05/16/14 1138  . norepinephrine (LEVOPHED) Adult infusion 10 mcg/min (05/16/14 1030)   PRN Meds:.docusate, docusate, ondansetron (ZOFRAN) IV  ROS: General: no fevers/chills/night sweats prior to the admission.  However he had severe nausea and vomiting.  On further questioning mother also states that he has severe pain in both his legs and has difficulty in moving his legs  And does not allow anybody to touch them due to pain in his legs.does not admit to any  neurologic deficits.  ROS is limited.    Physical Exam: Blood pressure 107/59, pulse 107, temperature 98.6 F (37 C), temperature source Oral, resp. rate 18, height 5' 7.32" (1.71 m), weight 69.2 kg (152 lb 8.9 oz), SpO2 99.00%.   General appearance: alert, cooperative, appears stated age, no distress and slowed mentation Lungs: clear to auscultation bilaterally Chest wall: no tenderness Heart: regular rate and rhythm, S1, S2 normal, no murmur, click, rub or gallop Abdomen: soft, non-tender; bowel sounds normal; no masses,  no organomegaly Extremities: difficult to analyze  extremities, patient was sleeping at an angle, when I asked him to move his lower extremities he states they hurt him and he appeared  to have motor weakness in the lower extremities and difficult to examine, mother states that he has had chronic pain in his legs and does not want me to examine completely. Pulses: 2+ and symmetric Neurologic: Mental status: Alert, oriented, thought content appropriate, affect: blunted, But responds appropriately Motor: Complete examination not performed,  appears to have bilateral lower extremity motor weakness.  Labs:   Lab Results  Component Value Date   WBC 8.5 05/16/2014   HGB 12.0* 05/16/2014   HCT 34.9* 05/16/2014   MCV 98.9 05/16/2014   PLT 162 05/16/2014    Recent Labs Lab 05/16/14 0500  NA 140  K 3.9  CL 107  CO2 21  BUN 26*  CREATININE 2.34*  CALCIUM 8.5  PROT 6.5  BILITOT 0.3  ALKPHOS 149*  ALT 57*  AST 112*  GLUCOSE 112*   Lab Results  Component Value Date   CKTOTAL 74 02/24/2014   TROPONINI >20.00* 05/16/2014    Lipid Panel     Component Value Date/Time   CHOL 112 08/03/2013 1414   TRIG 204* 08/03/2013 1414   HDL 17* 08/03/2013 1414   CHOLHDL 6.6 08/03/2013 1414   VLDL 41* 08/03/2013 1414   LDLCALC 54 08/03/2013 1414    EKG: normal EKG, normal sinus rhythm, unchanged from previous tracings.No evidence of ischemia, normal QT interval.  Borderline low  voltage complexes.    Radiology: Ct Head Wo Contrast  05/15/2014   CLINICAL DATA:  Status post cardiac arrest  EXAM: CT HEAD WITHOUT CONTRAST  TECHNIQUE: Contiguous axial images were obtained from the base of the skull through the vertex without intravenous contrast.  COMPARISON:  03/01/2014  FINDINGS: The bony calvarium is intact. No gross soft tissue abnormality is noted. The ventricles are of normal size and configuration. No significant extra-axial fluid collection is noted. No findings to suggest acute hemorrhage, acute infarction or space-occupying mass lesion are noted.  IMPRESSION: No acute intracranial abnormality at this time.   Electronically Signed   By: Inez Catalina M.D.   On: 05/15/2014 07:07   Dg Chest Port 1 View  05/16/2014   CLINICAL DATA:  Respiratory failure.  Intubated patient.  EXAM: PORTABLE CHEST - 1 VIEW  COMPARISON:  05/15/2014.  FINDINGS: Endotracheal tube tip lies 7.5 cm above the carina, without significant change. Nasogastric tube passes below the diaphragm into the stomach. Left subclavian central venous line tip lies in the lower superior vena cava.  Mild medial lung base opacity is noted consistent with atelectasis. Lungs are hyperexpanded but otherwise clear. No evidence of pneumonia or edema. No pleural effusion or pneumothorax.  IMPRESSION: 1. No acute cardiopulmonary disease.  Mild basilar atelectasis. 2. Support apparatus is stable as described.   Electronically Signed   By: Lajean Manes M.D.   On: 05/16/2014 08:26   Dg Chest Port 1 View  05/15/2014   CLINICAL DATA:  Shock.  EXAM: PORTABLE CHEST - 1 VIEW  COMPARISON:  05/15/2014 .  FINDINGS: Endotracheal tube, NG tube, left subclavian line in good anatomic position. Previously identified pulmonary infiltrates have almost completely cleared. Lungs are clear. No pleural effusion or pneumothorax. Heart size normal. No acute bony abnormality.  IMPRESSION: 1. Lines and tubes in good anatomic position. 2. Near complete  clearing of bilateral pulmonary infiltrates   Electronically Signed   By: Bonney Lake   On: 05/15/2014 12:24   Dg Chest Port 1 View  05/15/2014   CLINICAL DATA:  Cardiac arrest.  Cancer patient HIV.  EXAM: PORTABLE CHEST -  1 VIEW  COMPARISON:  02/23/2014  FINDINGS: Endotracheal tube tip measures 3.7 cm above the carinal. Shallow inspiration. Heart size and pulmonary vascularity are normal. Perihilar infiltration suggesting pneumonia or edema. No blunting of costophrenic angles. No pneumothorax.  IMPRESSION: Endotracheal tube tip measures 3.7 cm above the carina. Bilateral perihilar infiltrates. Shallow inspiration.   Electronically Signed   By: Lucienne Capers M.D.   On: 05/15/2014 06:29   Dg Abd Portable 1v  05/16/2014   CLINICAL DATA:  Orogastric tube placement initial evaluation  EXAM: PORTABLE ABDOMEN - 1 VIEW  COMPARISON:  None.  FINDINGS: Orogastric tube projects over the stomach in the left upper quadrant. Left diaphragm is mildly elevated.  IMPRESSION: Orogastric tube over the stomach   Electronically Signed   By: Skipper Cliche M.D.   On: 05/16/2014 07:46   ASSESSMENT AND PLAN:  1.  Cardiopulmonary arrest, history of any suspicious for cardiac event leading to seizure disorder on initial presentation. 2.  Type II NSTEMI, however although he had prolonged CPR for lasting an hour and in cardiogenic shock, underlying coronary artery disease eventually need to be excluded for appropriate management and diagnosis of his cardiac arrest/cardiopulmonary arrest. Recommendation: Patient is hemodynamically stable and responds appropriately.  He will eventually need coronary angiography to definitively exclude CAD.  I will evaluate his echocardiogram, suspect he probably has normal left ventricle systolic function and his hypotension was clearly related to dehydration, patient's CVP was 6 earlier this morning. Hypotension responded to fluid resuscitation. However there is no urgency for proceeding  with coronary angiography, EKG does not reveal any acute ST-T wave changes of ischemia.  He does not appear to be on any medications that can cause QT prolongation, however the antiretroviral therapy ,Triumeq that the patient is on can potentially cause myocardial infarction and rhabdomyolysis.  At this point would recommend continuing his amiodarone until his ejection fraction was evaluated by echocardiogram.  Laverda Page, MD 05/16/2014, 6:09 PM [facility]. [place] Pager: [phone] Office: [phone] If no answer Cell [phone]

## 2014-05-16 NOTE — Progress Notes (Signed)
PULMONARY / CRITICAL CARE MEDICINE   Name: Thomas [name] MRN: [id number] DOB: 01-25-1981    ADMISSION DATE:  05/15/2014  CHIEF COMPLAINT:  Cardiac arrest  INITIAL PRESENTATION:  33 yo male former smoker noted to have seizure like activity at home.  On arrival by EMS pt was in VT/VF/PEA.  Approximately 1 hour for ROSC.  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 Pressor dependant but weaning.    SUBJECTIVE:  Awake, alert. No distress.  Weans well on PSV  VITAL SIGNS: Temp:  [97.3 F (36.3 C)-99.8 F (37.7 C)] 99.3 F (37.4 C) (10/24 0721) Pulse Rate:  [83-117] 107 (10/24 0900) Resp:  [15-36] 18 (10/24 0900) BP: (50-125)/(38-107) 107/59 mmHg (10/24 0900) SpO2:  [77 %-100 %] 99 % (10/24 0900) FiO2 (%):  [40 %-60 %] 40 % (10/24 0800) HEMODYNAMICS:   VENTILATOR SETTINGS: Vent Mode:  [-] PRVC FiO2 (%):  [40 %-60 %] 40 % Set Rate:  [15 bmp-22 bmp] 15 bmp Vt Set:  [600 mL] 600 mL PEEP:  [5 cmH20] 5 cmH20 Plateau Pressure:  [21 cmH20-33 cmH20] 21 cmH20 INTAKE / OUTPUT:  Intake/Output Summary (Last 24 hours) at 05/16/14 1032 Last data filed at 05/16/14 0900  Gross per 24 hour  Intake 1685.53 ml  Output   2610 ml  Net -924.47 ml    PHYSICAL EXAMINATION: General: awake, follows commands. Moves all ext oriented X 3 Neuro:  MAEs HEENT: NCAT Cardiovascular: Tachycardic, Regular, no murmur Lungs:  Scattered rhonchi Abdomen:  Soft, healing abdominal surgical wounds w/o drainage Ext:  No edema  LABS:  CBC  Recent Labs Lab 05/15/14 0558 05/15/14 0624 05/16/14 0500  WBC 18.0*  --  8.5  HGB 12.5* 13.3 12.0*  HCT 38.1* 39.0 34.9*  PLT 206  --  162   Coag's  Recent Labs Lab 05/15/14 0558  INR 1.39    BMET  Recent Labs Lab  05/15/14 0558 05/15/14 0624 05/16/14 0500  NA 143 140 140  K 4.1 3.7 3.9  CL 104 110 107  CO2 11*  --  21  BUN 22 26* 26*  CREATININE 2.14* 2.20* 2.34*  GLUCOSE 367* 363* 112*   Electrolytes  Recent Labs Lab 05/15/14 0558 05/16/14 0500  CALCIUM 8.3* 8.5  MG 2.2  --   PHOS 9.2*  --     Sepsis Markers  Recent Labs Lab 05/15/14 0624 05/16/14 0500  LATICACIDVEN 12.72* 1.3   ABG  Recent Labs Lab 05/15/14 0631 05/15/14 1358 05/16/14 0500  PHART 7.019* 7.254* 7.349*  PCO2ART 51.5* 37.6 35.5  PO2ART 151.0* 102.0* 79.9*   Liver Enzymes  Recent Labs Lab 05/15/14 0558 05/16/14 0500  AST 96* 112*  ALT 73* 57*  ALKPHOS 173* 149*  BILITOT <0.2* 0.3  ALBUMIN 2.4* 2.7*    Cardiac Enzymes  Recent Labs Lab 05/15/14 1200 05/15/14 1800 05/15/14 2359  TROPONINI 19.25* >20.00* >20.00*    Glucose  Recent Labs Lab 05/15/14 0525 05/15/14 0550 05/15/14 0825  GLUCAP 83 151* 292*    CXR: NACPD     ASSESSMENT / PLAN:  PULMONARY ETT 10/23 >>  A: Acute respiratory failure post cardiac arrest. Hx of smoking. Passed weaning trial, now extubated.  P:   Wean FIO2 Cont pulse ox   CARDIOVASCULAR L [place] CVL 10/23 >> 10/24  A:  Prolonged cardiac arrest - VT/VF/PEA  Positive troponins - NSTEMI. Unclear if primary or secondary phenomenon Shock.  ? Cardiogenic vs volume related  P:  Monitor hemodynamics Cont amiodarone, qTc 469 goal CVP 10 - give volume Cont to cycle cardiac markers Check Echocardiogram post arrest-nml LV fn in 02/2014 Cards consult   RENAL A:   AKI, nonoliguric CKD s/p Rt nephrectomy Severe lactic acidosis -resolved Hyperphosphatemia P:   Monitor BMET intermittently Monitor I/Os Correct electrolytes as indicated Change to D51/2 NS at 125/hr   GASTROINTESTINAL A:   Elevated LFTs P:   SUP: IV PPI Adv diet as tolerated  F/u am   HEMATOLOGIC A:   Hx of Hodkin's disease. P:  Trend lactate SQ heparin and SCD for  DVT Trend CBC  INFECTIOUS A:   HIV Leukocytosis without overt infectious cause Hypothermia, resolved P:   Monitor off Abx Blood Cx 10/23 >> Urine 10/23 GNR >>  ENDOCRINE A:   Stress induced hyperglycemia P:   Cont SSI  NEUROLOGIC A:   Post anoxic encephalopathy  P:   RASS goal: n/a  Supportive care    FAMILY  - Updates: mother 10/24  - Inter-disciplinary family meet or Palliative Care meeting due by: 10/30  TODAY'S SUMMARY: Unclear cause of arrest ,await rpt echo   Care during the described time interval was provided by me and/or other providers on the critical care team.  I have reviewed this patient's available data, including medical history, events of note, physical examination and test results as part of my evaluation  CC time x 35 m  Kara Mead MD. [facility]. [facility] Pager [phone] If no response call [phone]

## 2014-05-16 NOTE — Procedures (Signed)
Extubation Procedure Note  Patient Details:   Name: Thomas [name] DOB: Jan 09, 1981 MRN: [id number]   Airway Documentation:     Evaluation  O2 sats: stable throughout Complications: No apparent complications Patient did tolerate procedure well. Bilateral Breath Sounds: Clear;Diminished Suctioning: Airway Yes  Patient extubated to [place]. No complications. Vital signs stable at this time. RN at bedside. RT will continue to monitor.  Mcneil Sober 05/16/2014, 8:38 AM

## 2014-05-17 DIAGNOSIS — N179 Acute kidney failure, unspecified: Secondary | ICD-10-CM

## 2014-05-17 LAB — COMPREHENSIVE METABOLIC PANEL
ALBUMIN: 2.2 g/dL — AB (ref 3.5–5.2)
ALK PHOS: 119 U/L — AB (ref 39–117)
ALT: 37 U/L (ref 0–53)
AST: 101 U/L — ABNORMAL HIGH (ref 0–37)
Anion gap: 13 (ref 5–15)
BUN: 18 mg/dL (ref 6–23)
CHLORIDE: 108 meq/L (ref 96–112)
CO2: 20 mEq/L (ref 19–32)
Calcium: 8 mg/dL — ABNORMAL LOW (ref 8.4–10.5)
Creatinine, Ser: 2.22 mg/dL — ABNORMAL HIGH (ref 0.50–1.35)
GFR calc non Af Amer: 38 mL/min — ABNORMAL LOW (ref >90)
GFR, EST AFRICAN AMERICAN: 44 mL/min — AB (ref >90)
GLUCOSE: 105 mg/dL — AB (ref 70–99)
POTASSIUM: 3.5 meq/L — AB (ref 3.7–5.3)
Sodium: 141 mEq/L (ref 137–147)
TOTAL PROTEIN: 5.8 g/dL — AB (ref 6.0–8.3)
Total Bilirubin: 0.4 mg/dL (ref 0.3–1.2)

## 2014-05-17 LAB — URINE CULTURE: Colony Count: >=100000

## 2014-05-17 LAB — CBC
HEMATOCRIT: 26.3 % — AB (ref 39.0–52.0)
HEMOGLOBIN: 9 g/dL — AB (ref 13.0–17.0)
MCH: 32.7 pg (ref 26.0–34.0)
MCHC: 34.2 g/dL (ref 30.0–36.0)
MCV: 95.6 fL (ref 78.0–100.0)
Platelets: 83 10*3/uL — ABNORMAL LOW (ref 150–400)
RBC: 2.75 MIL/uL — ABNORMAL LOW (ref 4.22–5.81)
RDW: 13.4 % (ref 11.5–15.5)
WBC: 4 10*3/uL (ref 4.0–10.5)

## 2014-05-17 LAB — RAPID URINE DRUG SCREEN, HOSP PERFORMED
Amphetamines: NOT DETECTED
BARBITURATES: NOT DETECTED
Benzodiazepines: NOT DETECTED
Cocaine: NOT DETECTED
Opiates: NOT DETECTED
Tetrahydrocannabinol: NOT DETECTED

## 2014-05-17 LAB — LACTIC ACID, PLASMA: Lactic Acid, Venous: 0.8 mmol/L (ref 0.5–2.2)

## 2014-05-17 LAB — TROPONIN I: TROPONIN I: 17.5 ng/mL — AB (ref <0.30)

## 2014-05-17 MED ORDER — POTASSIUM CHLORIDE CRYS ER 20 MEQ PO TBCR
40.0000 meq | EXTENDED_RELEASE_TABLET | Freq: Once | ORAL | Status: AC
  Administered 2014-05-17: 40 meq via ORAL
  Filled 2014-05-17: qty 2

## 2014-05-17 MED ORDER — SODIUM CHLORIDE 0.9 % IV BOLUS (SEPSIS)
750.0000 mL | Freq: Once | INTRAVENOUS | Status: AC
  Administered 2014-05-17: 750 mL via INTRAVENOUS

## 2014-05-17 MED ORDER — DEXTROSE 5 % IV SOLN
1.0000 g | INTRAVENOUS | Status: DC
  Administered 2014-05-17 – 2014-05-21 (×5): 1 g via INTRAVENOUS
  Filled 2014-05-17 (×5): qty 10

## 2014-05-17 MED ORDER — SODIUM CHLORIDE 0.9 % IV BOLUS (SEPSIS)
1000.0000 mL | Freq: Once | INTRAVENOUS | Status: AC
  Administered 2014-05-17: 1000 mL via INTRAVENOUS

## 2014-05-17 MED ORDER — SODIUM CHLORIDE 0.9 % IV SOLN
INTRAVENOUS | Status: DC
  Administered 2014-05-18: 04:00:00 via INTRAVENOUS

## 2014-05-17 MED ORDER — POTASSIUM CHLORIDE CRYS ER 20 MEQ PO TBCR
40.0000 meq | EXTENDED_RELEASE_TABLET | Freq: Once | ORAL | Status: DC
  Administered 2014-05-17: 40 meq via ORAL
  Filled 2014-05-17: qty 2

## 2014-05-17 NOTE — Progress Notes (Signed)
PULMONARY / CRITICAL CARE MEDICINE   Name: RAYDIN BIELINSKI MRN: [id number] DOB: 03-Sep-1980    ADMISSION DATE:  05/15/2014  CHIEF COMPLAINT:  Cardiac arrest  INITIAL PRESENTATION:  33 yo male former smoker noted to have seizure like activity at home.  On arrival by EMS pt was in VT/VF/PEA.  Approximately 1 hour for ROSC.  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 Pressor dependant but weaning. Cards consulted. Palliative care consult cancelled.  10/25: off pressors. Responded well to IVFs, up in chair, advancing diet. Awaiting ECHO. Was going to go to tele but remained hypotensive. ABX started for UTI. Fluid given. Transfer d/cd    SUBJECTIVE:  Awake, alert. No distress.   VITAL SIGNS: Temp:  [98.4 F (36.9 C)-99.6 F (37.6 C)] 98.8 F (37.1 C) (10/25 0830) Pulse Rate:  [90-106] 95 (10/25 0400) Resp:  [14-26] 24 (10/25 0900) BP: (72-104)/(50-75) 88/66 mmHg (10/25 0900) SpO2:  [93 %-100 %] 96 % (10/25 0800) HEMODYNAMICS: CVP:  [2 mmHg-7 mmHg] 2 mmHg VENTILATOR SETTINGS:   INTAKE / OUTPUT:  Intake/Output Summary (Last 24 hours) at 05/17/14 0931 Last data filed at 05/17/14 0900  Gross per 24 hour  Intake 3130.73 ml  Output   2440 ml  Net 690.73 ml    PHYSICAL EXAMINATION: General: awake, follows commands.  Neuro:  MAEs Moves all ext oriented X 3, but has short term memory deficits  HEENT: NCAT Cardiovascular: Regular, no murmur Lungs:  Scattered rhonchi, improve w/ cough  Abdomen:  Soft, healing abdominal surgical wounds w/o drainage Ext:  No edema  LABS:  CBC  Recent Labs Lab 05/15/14 0558 05/15/14 0624 05/16/14 0500 05/17/14 0500  WBC 18.0*  --  8.5 4.0  HGB 12.5* 13.3 12.0* 9.0*  HCT 38.1* 39.0 34.9* 26.3*  PLT 206  --  162 83*    Coag's  Recent Labs Lab 05/15/14 0558  INR 1.39    BMET  Recent Labs Lab 05/15/14 0558 05/15/14 0624 05/16/14 0500 05/17/14 0500  NA 143 140 140 141  K 4.1 3.7 3.9 3.5*  CL 104 110 107 108  CO2 11*  --  21 20  BUN 22 26* 26* 18  CREATININE 2.14* 2.20* 2.34* 2.22*  GLUCOSE 367* 363* 112* 105*   Electrolytes  Recent Labs Lab 05/15/14 0558 05/16/14 0500 05/17/14 0500  CALCIUM 8.3* 8.5 8.0*  MG 2.2  --   --   PHOS 9.2*  --   --     Sepsis Markers  Recent Labs Lab 05/15/14 0624 05/16/14 0500 05/16/14 1130  LATICACIDVEN 12.72* 1.3 1.1   ABG  Recent Labs Lab 05/15/14 0631 05/15/14 1358 05/16/14 0500  PHART 7.019* 7.254* 7.349*  PCO2ART 51.5* 37.6 35.5  PO2ART 151.0* 102.0* 79.9*   Liver Enzymes  Recent Labs Lab 05/15/14 0558 05/16/14 0500 05/17/14 0500  AST 96* 112* 101*  ALT 73* 57* 37  ALKPHOS 173* 149* 119*  BILITOT <0.2* 0.3 0.4  ALBUMIN 2.4* 2.7* 2.2*    Cardiac Enzymes  Recent Labs Lab 05/16/14 1249 05/16/14 1602 05/16/14 2258  TROPONINI >20.00* >20.00* 17.50*    Glucose  Recent Labs Lab 05/15/14 0525 05/15/14 0550 05/15/14 0825  GLUCAP 83 151* 292*    CXR: NACPD   ASSESSMENT / PLAN:  PULMONARY ETT 10/23 >> 10/24 A:  Acute respiratory failure post cardiac arrest-extubated. Hx of smoking.   P:   Wean FIO2 Cont pulse ox    CARDIOVASCULAR L [place] CVL 10/23 >> 10/24 A:  Prolonged cardiac arrest - VT/VF/PEA  NSTEMI. Unclear if primary or secondary phenomenon Shock.  ? Cardiogenic vs volume related -->initially favor volume as shock resolved w/ normal saline, but sepsis on diff dx given GNR in urine  Echo -nmlLV fn, mod pulm htn P:  Cont tele  dc amio  Cards consulted, wonder if he needs EP eval???   Cont IVFs   RENAL A:   AKI, nonoliguric CKD s/p Rt nephrectomy Severe lactic acidosis -resolved Hyperphosphatemia Hypokalemia  P:   Monitor BMET intermittently Monitor I/Os Correct electrolytes as  indicated Cont  D51/2 NS at 125/hr   GASTROINTESTINAL A:   Elevated LFTs-->improved; likely d/t shock liver  P:   Adv diet as tolerated  F/u am   HEMATOLOGIC A:   Hx of Hodkin's disease. P:  SQ heparin and SCD for DVT Trend CBC  INFECTIOUS A:   HIV UTI; GNRs  P:   Monitor off Abx Blood Cx 10/23 >>ng Urine 10/23 GNR >> Rocephin 10/25>>> Cont antivirals  ENDOCRINE A:   Stress induced hyperglycemia P:   Cont SSI  NEUROLOGIC A:   Post anoxic encephalopathy  Has some short term memory deficits  P:   RASS goal: n/a  Supportive care  Will have SLP see for cognitive therapy; he will likely need rehab    FAMILY  - Updates: mother 10/24  - Inter-disciplinary family meet or Palliative Care meeting due by: 10/30  TODAY'S SUMMARY:  Unclear cause of arrest, nml qTc, improving, close to moving out of ICU, cath once renal function improved  Care during the described time interval was provided by me and/or other providers on the critical care team.  I have reviewed this patient's available data, including medical history, events of note, physical examination and test results as part of my evaluation  CC time x 30m  Rigoberto Noel. MD

## 2014-05-17 NOTE — Progress Notes (Addendum)
Subjective:  Sitting up in chair. No complaints  Objective:  Vital Signs in the last 24 hours: Temp:  [98.3 F (36.8 C)-99.6 F (37.6 C)] 98.3 F (36.8 C) (10/25 1123) Pulse Rate:  [25-105] 25 (10/25 1000) Resp:  [14-26] 15 (10/25 1000) BP: (72-103)/(50-75) 89/62 mmHg (10/25 1000) SpO2:  [58 %-100 %] 58 % (10/25 1000)  Intake/Output from previous day: 10/24 0701 - 10/25 0700 In: 3036.9 [I.V.:3036.9] Out: 2290 [Urine:2290]  Physical Exam: General appearance: alert, cooperative, appears stated age, no distress and slowed mentation  Lungs: clear to auscultation bilaterally  Chest wall: no tenderness  Heart: regular rate and rhythm, S1, S2 normal, no murmur, click, rub or gallop  Abdomen: soft, non-tender; bowel sounds normal; no masses, no organomegaly Extremities: extremities normal, atraumatic, no cyanosis or edema and diffuse tenderness to touch    Lab Results: BMP  Recent Labs  05/15/14 0558 05/15/14 0624 05/16/14 0500 05/17/14 0500  NA 143 140 140 141  K 4.1 3.7 3.9 3.5*  CL 104 110 107 108  CO2 11*  --  21 20  GLUCOSE 367* 363* 112* 105*  BUN 22 26* 26* 18  CREATININE 2.14* 2.20* 2.34* 2.22*  CALCIUM 8.3*  --  8.5 8.0*  GFRNONAA 39*  --  35* 38*  GFRAA 46*  --  41* 44*    CBC  Recent Labs Lab 05/15/14 0558  05/17/14 0500  WBC 18.0*  < > 4.0  RBC 3.65*  < > 2.75*  HGB 12.5*  < > 9.0*  HCT 38.1*  < > 26.3*  PLT 206  < > 83*  MCV 104.4*  < > 95.6  MCH 34.2*  < > 32.7  MCHC 32.8  < > 34.2  RDW 13.2  < > 13.4  LYMPHSABS 9.2*  --   --   MONOABS 0.7  --   --   EOSABS 0.2  --   --   BASOSABS 0.0  --   --   < > = values in this interval not displayed.  HEMOGLOBIN A1C No results found for this basename: HGBA1C, MPG    Cardiac Panel (last 3 results)  Recent Labs  01/18/14 0514 02/24/14 1715  05/16/14 1249 05/16/14 1602 05/16/14 2258  CKTOTAL  --  74  --   --   --   --   TROPONINI <0.30  --   < > >20.00* >20.00* 17.50*  < > = values in this  interval not displayed.  BNP (last 3 results) No results found for this basename: PROBNP,  in the last 8760 hours  TSH  Recent Labs  06/24/13 1233 08/03/13 1414  TSH 2.143 3.813    CHOLESTEROL  Recent Labs  06/17/13 1658 08/03/13 1414  CHOL 88 112    Hepatic Function Panel  Recent Labs  05/15/14 0558 05/16/14 0500 05/17/14 0500  PROT 6.2 6.5 5.8*  ALBUMIN 2.4* 2.7* 2.2*  AST 96* 112* 101*  ALT 73* 57* 37  ALKPHOS 173* 149* 119*  BILITOT <0.2* 0.3 0.4   Cardiac Studies:  EKG: normal EKG, normal sinus rhythm, unchanged from previous tracings.No evidence of ischemia, normal QT interval. Borderline low voltage complexes.   Assessment/Plan:  1. NSTEMI with echo 05/17/2014: Essentially normal echo with moderate pulmonary hypertension. No wall motion abnormality 2. C. Arrest, etiology remains unanswered. 3. Acute on chronic renal failure  Rec: Discontinue Amio. Plan on coronary angio tomorrow if S. Cr is stable. Check drug screen if not done.   Addendum: 05/18/2014:  Echocardiogram 05/17/2014: Normal echocardiogram, normal LV systolic function. I have discussed the findings with the patient's mother was at the bedside, explained to them regarding directly proceeding with coronary angiography to evaluate for coronary anatomy, discussed with him regarding less than 1% risk of death, stroke, MI, need for urgent CABG but not limited to this and put 3% with PCI. Also discussed regarding a 5-6% risk of acute renal failure.  Patient and his mother agree to proceed. Serum creatinine has remained stable.   Laverda Page, M.D. 05/17/2014, 1:00 PM [facility] Pager: [phone] Office: [phone] If no answer: [phone]

## 2014-05-17 NOTE — Progress Notes (Signed)
ANTIBIOTIC CONSULT NOTE - INITIAL  Pharmacy Consult for ceftriaxone Indication: UTI  Allergies  Allergen Reactions  . Tylenol [Acetaminophen]     sweats    Patient Measurements: Height: 5' 7.32" (171 cm) Weight: 152 lb 8.9 oz (69.2 kg) IBW/kg (Calculated) : 66.84   Vital Signs: Temp: 98.8 F (37.1 C) (10/25 0830) BP: 88/66 mmHg (10/25 0900) Pulse Rate: 95 (10/25 0400) Intake/Output from previous day: 10/24 0701 - 10/25 0700 In: 3036.9 [I.V.:3036.9] Out: 2290 [Urine:2290] Intake/Output from this shift: Total I/O In: 283.4 [I.V.:283.4] Out: 350 [Urine:350]  Labs:  Recent Labs  05/15/14 0558 05/15/14 0624 05/16/14 0500 05/17/14 0500  WBC 18.0*  --  8.5 4.0  HGB 12.5* 13.3 12.0* 9.0*  PLT 206  --  162 83*  CREATININE 2.14* 2.20* 2.34* 2.22*   Estimated Creatinine Clearance: 45.6 ml/min (by C-G formula based on Cr of 2.22). No results found for this basename: VANCOTROUGH, VANCOPEAK, VANCORANDOM, [place], GENTPEAK, GENTRANDOM, TOBRATROUGH, TOBRAPEAK, TOBRARND, AMIKACINPEAK, AMIKACINTROU, AMIKACIN,  in the last 72 hours   Microbiology: Recent Results (from the past 720 hour(s))  CULTURE, BLOOD (ROUTINE X 2)     Status: None   Collection Time    05/15/14  5:58 AM      Result Value Ref Range Status   Specimen Description BLOOD LEFT HAND   Final   Special Requests BOTTLES DRAWN AEROBIC AND ANAEROBIC 5CC EACH   Final   Culture  Setup Time     Final   Value: 05/15/2014 08:58     Performed at [facility]   Culture     Final   Value:        BLOOD CULTURE RECEIVED NO GROWTH TO DATE CULTURE WILL BE HELD FOR 5 DAYS BEFORE ISSUING A FINAL NEGATIVE REPORT     Performed at [facility]   Report Status PENDING   Incomplete  CULTURE, BLOOD (ROUTINE X 2)     Status: None   Collection Time    05/15/14  6:06 AM      Result Value Ref Range Status   Specimen Description BLOOD LEFT ARM   Final   Special Requests BOTTLES DRAWN AEROBIC AND ANAEROBIC 4CC EACH    Final   Culture  Setup Time     Final   Value: 05/15/2014 08:58     Performed at [facility]   Culture     Final   Value:        BLOOD CULTURE RECEIVED NO GROWTH TO DATE CULTURE WILL BE HELD FOR 5 DAYS BEFORE ISSUING A FINAL NEGATIVE REPORT     Performed at [facility]   Report Status PENDING   Incomplete  MRSA PCR SCREENING     Status: None   Collection Time    05/15/14  7:53 AM      Result Value Ref Range Status   MRSA by PCR NEGATIVE  NEGATIVE Final   Comment:            The GeneXpert MRSA Assay (FDA     approved for NASAL specimens     only), is one component of a     comprehensive MRSA colonization     surveillance program. It is not     intended to diagnose MRSA     infection nor to guide or     monitor treatment for     MRSA infections.  URINE CULTURE     Status: None   Collection Time  05/15/14  7:57 AM      Result Value Ref Range Status   Specimen Description URINE, CLEAN CATCH   Final   Special Requests NONE   Final   Culture  Setup Time     Final   Value: 05/15/2014 08:15     Performed at [place]     Final   Value: >=100,000 COLONIES/ML     Performed at [facility]   Culture     Final   Value: [place]     Performed at [facility]   Report Status PENDING   Incomplete  CLOSTRIDIUM DIFFICILE BY PCR     Status: None   Collection Time    05/15/14  4:26 PM      Result Value Ref Range Status   C difficile by pcr NEGATIVE  NEGATIVE Final   Assessment: 53 YOM s/p right nephrectomy earlier in October. Urine growing >100K GNR, not yet speciated. Received 1 time doses of Vanc and Zosyn on 10/23.  SCr 2.22, est CrCl ~8mL/min  Goal of Therapy:  eradication of infection  Plan:  1. Ceftriaxone 1g IV q24h 2. Pharmacy to sign off as no renal adjustments indicated. Please re-consult if needed  Hibba Schram D. Joannah Gitlin, PharmD, BCPS Clinical Pharmacist Pager: [phone] 05/17/2014 9:58 AM

## 2014-05-17 NOTE — Progress Notes (Signed)
Patient would not eat Hospital food and his mother went to pick him up [place].  I attempted to offer education about to offer education about food selection and making choices with low sodium. Patient and his mother reported the hospital food has "No Taste" and that he needs taste to eat food.  They did not agree that options I offered to add "taste" to the food would be options for him.    Richardean Canal RN, BSN, CCRN

## 2014-05-18 ENCOUNTER — Encounter (HOSPITAL_COMMUNITY)
Admission: EM | Disposition: A | Discharge status: Home / Self Care | Payer: Self-pay | Source: Home / Self Care | Attending: Pulmonary Disease

## 2014-05-18 DIAGNOSIS — B20 Human immunodeficiency virus [HIV] disease: Secondary | ICD-10-CM

## 2014-05-18 DIAGNOSIS — D649 Anemia, unspecified: Secondary | ICD-10-CM

## 2014-05-18 DIAGNOSIS — N19 Unspecified kidney failure: Secondary | ICD-10-CM

## 2014-05-18 DIAGNOSIS — C8190 Hodgkin lymphoma, unspecified, unspecified site: Secondary | ICD-10-CM

## 2014-05-18 DIAGNOSIS — D696 Thrombocytopenia, unspecified: Secondary | ICD-10-CM

## 2014-05-18 HISTORY — PX: LEFT HEART CATHETERIZATION WITH CORONARY ANGIOGRAM: SHX5451

## 2014-05-18 LAB — LIPID PANEL
CHOLESTEROL: 129 mg/dL (ref 0–200)
HDL: 25 mg/dL — ABNORMAL LOW (ref >39)
LDL Cholesterol: 85 mg/dL (ref 0–99)
Total CHOL/HDL Ratio: 5.2 RATIO
Triglycerides: 95 mg/dL (ref <150)
VLDL: 19 mg/dL (ref 0–40)

## 2014-05-18 LAB — BASIC METABOLIC PANEL
Anion gap: 10 (ref 5–15)
BUN: 12 mg/dL (ref 6–23)
CHLORIDE: 110 meq/L (ref 96–112)
CO2: 20 meq/L (ref 19–32)
Calcium: 8 mg/dL — ABNORMAL LOW (ref 8.4–10.5)
Creatinine, Ser: 2.2 mg/dL — ABNORMAL HIGH (ref 0.50–1.35)
GFR calc Af Amer: 44 mL/min — ABNORMAL LOW (ref >90)
GFR calc non Af Amer: 38 mL/min — ABNORMAL LOW (ref >90)
Glucose, Bld: 88 mg/dL (ref 70–99)
Potassium: 3.2 mEq/L — ABNORMAL LOW (ref 3.7–5.3)
Sodium: 140 mEq/L (ref 137–147)

## 2014-05-18 LAB — CBC
HEMATOCRIT: 26.9 % — AB (ref 39.0–52.0)
HEMATOCRIT: 26.6 % — AB (ref 39.0–52.0)
HEMOGLOBIN: 9.2 g/dL — AB (ref 13.0–17.0)
HEMOGLOBIN: 9.2 g/dL — AB (ref 13.0–17.0)
MCH: 32.9 pg (ref 26.0–34.0)
MCH: 33.5 pg (ref 26.0–34.0)
MCHC: 34.2 g/dL (ref 30.0–36.0)
MCHC: 34.6 g/dL (ref 30.0–36.0)
MCV: 96.1 fL (ref 78.0–100.0)
MCV: 96.7 fL (ref 78.0–100.0)
Platelets: 92 10*3/uL — ABNORMAL LOW (ref 150–400)
Platelets: 96 10*3/uL — ABNORMAL LOW (ref 150–400)
RBC: 2.8 MIL/uL — AB (ref 4.22–5.81)
RBC: 2.75 MIL/uL — ABNORMAL LOW (ref 4.22–5.81)
RDW: 13.3 % (ref 11.5–15.5)
RDW: 13.2 % (ref 11.5–15.5)
WBC: 3.5 10*3/uL — ABNORMAL LOW (ref 4.0–10.5)
WBC: 3.7 10*3/uL — AB (ref 4.0–10.5)

## 2014-05-18 LAB — CREATININE, SERUM
CREATININE: 2.19 mg/dL — AB (ref 0.50–1.35)
GFR calc Af Amer: 44 mL/min — ABNORMAL LOW (ref >90)
GFR, EST NON AFRICAN AMERICAN: 38 mL/min — AB (ref >90)

## 2014-05-18 SURGERY — LEFT HEART CATHETERIZATION WITH CORONARY ANGIOGRAM
Anesthesia: LOCAL

## 2014-05-18 MED ORDER — SODIUM CHLORIDE 0.9 % IV SOLN: 250.0000 mL | INTRAVENOUS | Status: DC | PRN

## 2014-05-18 MED ORDER — ENSURE COMPLETE PO LIQD
237.0000 mL | Freq: Three times a day (TID) | ORAL | Status: DC
  Administered 2014-05-18 – 2014-05-21 (×8): 237 mL via ORAL
  Filled 2014-05-18 (×7): qty 237

## 2014-05-18 MED ORDER — HEPARIN (PORCINE) IN NACL 2-0.9 UNIT/ML-% IJ SOLN
INTRAMUSCULAR | Status: AC
  Filled 2014-05-18: qty 1000

## 2014-05-18 MED ORDER — VERAPAMIL HCL 2.5 MG/ML IV SOLN
INTRAVENOUS | Status: AC
  Filled 2014-05-18: qty 2

## 2014-05-18 MED ORDER — ENOXAPARIN SODIUM 40 MG/0.4ML ~~LOC~~ SOLN
40.0000 mg | SUBCUTANEOUS | Status: DC
  Administered 2014-05-19 – 2014-05-21 (×3): 40 mg via SUBCUTANEOUS
  Filled 2014-05-18 (×5): qty 0.4

## 2014-05-18 MED ORDER — SODIUM CHLORIDE 0.9 % IV SOLN
1.0000 mL/kg/h | INTRAVENOUS | Status: AC
  Administered 2014-05-18: 1 mL/kg/h via INTRAVENOUS

## 2014-05-18 MED ORDER — POTASSIUM CHLORIDE CRYS ER 20 MEQ PO TBCR
30.0000 meq | EXTENDED_RELEASE_TABLET | ORAL | Status: AC
  Administered 2014-05-18 (×2): 30 meq via ORAL
  Filled 2014-05-18 (×3): qty 1

## 2014-05-18 MED ORDER — ATORVASTATIN CALCIUM 80 MG PO TABS
80.0000 mg | ORAL_TABLET | Freq: Every day | ORAL | Status: DC
  Administered 2014-05-18 – 2014-05-20 (×3): 80 mg via ORAL
  Filled 2014-05-18 (×5): qty 1

## 2014-05-18 MED ORDER — SODIUM CHLORIDE 0.9 % IJ SOLN
3.0000 mL | Freq: Two times a day (BID) | INTRAMUSCULAR | Status: DC
  Administered 2014-05-18: 3 mL via INTRAVENOUS

## 2014-05-18 MED ORDER — FENTANYL CITRATE 0.05 MG/ML IJ SOLN
INTRAMUSCULAR | Status: AC
  Filled 2014-05-18: qty 2

## 2014-05-18 MED ORDER — HEPARIN SODIUM (PORCINE) 1000 UNIT/ML IJ SOLN
INTRAMUSCULAR | Status: AC
  Filled 2014-05-18: qty 1

## 2014-05-18 MED ORDER — POTASSIUM CHLORIDE CRYS ER 20 MEQ PO TBCR: 40.0000 meq | EXTENDED_RELEASE_TABLET | Freq: Once | ORAL | Status: DC

## 2014-05-18 MED ORDER — ASPIRIN 81 MG PO CHEW
81.0000 mg | CHEWABLE_TABLET | ORAL | Status: AC
  Administered 2014-05-18: 81 mg via ORAL
  Filled 2014-05-18: qty 1

## 2014-05-18 MED ORDER — ATORVASTATIN CALCIUM 10 MG PO TABS
10.0000 mg | ORAL_TABLET | Freq: Every day | ORAL | Status: DC
  Filled 2014-05-18: qty 1

## 2014-05-18 MED ORDER — LIDOCAINE HCL (PF) 1 % IJ SOLN
INTRAMUSCULAR | Status: AC
  Filled 2014-05-18: qty 30

## 2014-05-18 MED ORDER — SODIUM CHLORIDE 0.9 % IJ SOLN: 3.0000 mL | Freq: Two times a day (BID) | INTRAMUSCULAR | Status: DC

## 2014-05-18 MED ORDER — ASPIRIN 81 MG PO CHEW
81.0000 mg | CHEWABLE_TABLET | ORAL | Status: AC
  Administered 2014-05-19: 81 mg via ORAL
  Filled 2014-05-18: qty 1

## 2014-05-18 MED ORDER — NITROGLYCERIN 1 MG/10 ML FOR IR/CATH LAB
INTRA_ARTERIAL | Status: AC
  Filled 2014-05-18: qty 10

## 2014-05-18 MED ORDER — FERROUS SULFATE 325 (65 FE) MG PO TABS
325.0000 mg | ORAL_TABLET | Freq: Two times a day (BID) | ORAL | Status: DC
  Administered 2014-05-18 – 2014-05-21 (×6): 325 mg via ORAL
  Filled 2014-05-18 (×10): qty 1

## 2014-05-18 MED ORDER — METOPROLOL TARTRATE 25 MG PO TABS
25.0000 mg | ORAL_TABLET | Freq: Two times a day (BID) | ORAL | Status: DC
  Administered 2014-05-18 – 2014-05-21 (×6): 25 mg via ORAL
  Filled 2014-05-18 (×9): qty 1

## 2014-05-18 MED ORDER — SODIUM CHLORIDE 0.9 % IJ SOLN: 3.0000 mL | INTRAMUSCULAR | Status: DC | PRN

## 2014-05-18 MED ORDER — MIDAZOLAM HCL 2 MG/2ML IJ SOLN
INTRAMUSCULAR | Status: AC
  Filled 2014-05-18: qty 2

## 2014-05-18 NOTE — Progress Notes (Signed)
PULMONARY / CRITICAL CARE MEDICINE   Name: Thomas [name] MRN: [id number] DOB: 05-26-1981    ADMISSION DATE:  05/15/2014  CHIEF COMPLAINT:  Cardiac arrest  INITIAL PRESENTATION:  33 yo male former smoker noted to have seizure like activity at home.  On arrival by EMS pt was in VT/VF/PEA.  Approximately 1 hour for ROSC.  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 Pressor dependant but weaning. Cards consulted. Palliative care consult cancelled.  10/25: off pressors. Responded well to IVFs, up in chair, advancing diet. Awaiting ECHO. Was going to go to tele but remained hypotensive. ABX started for UTI. Fluid given. Transfer d/cd   10/26 cath 2V CAD  SUBJECTIVE:  Sleeping soundly, awakens appropriately.  afebrile  VITAL SIGNS: Temp:  [97.3 F (36.3 C)-99.5 F (37.5 C)] 98.7 F (37.1 C) (10/26 0341) Pulse Rate:  [25-85] 85 (10/26 0400) Resp:  [15-28] 21 (10/26 0700) BP: (87-121)/(49-87) 109/73 mmHg (10/26 0700) SpO2:  [96 %-100 %] 100 % (10/26 0400) HEMODYNAMICS: CVP:  [3 mmHg-14 mmHg] 7 mmHg VENTILATOR SETTINGS:   INTAKE / OUTPUT:  Intake/Output Summary (Last 24 hours) at 05/18/14 0730 Last data filed at 05/18/14 0700  Gross per 24 hour  Intake 5183.56 ml  Output   3130 ml  Net 2053.56 ml    PHYSICAL EXAMINATION: General: Awake, interactive in no distress  Neuro:  Alert and oriented, no focal deficits, amnesia for recent events HEENT: NCAT, anicteric sclerae, tongue normal Cardiovascular: Regular, no murmur, no JVD Lungs:  Scattered rhonchi that clear with cough. Abdomen:  Soft, healing abdominal surgical wounds w/o drainage Ext:  No edema  LABS:  CBC  Recent Labs Lab 05/16/14 0500 05/17/14 0500 05/18/14 0500  WBC 8.5 4.0 3.5*  HGB  12.0* 9.0* 9.2*  HCT 34.9* 26.3* 26.9*  PLT 162 83* 92*   Coag's  Recent Labs Lab 05/15/14 0558  INR 1.39    BMET  Recent Labs Lab 05/16/14 0500 05/17/14 0500 05/18/14 0500  NA 140 141 140  K 3.9 3.5* 3.2*  CL 107 108 110  CO2 21 20 20   BUN 26* 18 12  CREATININE 2.34* 2.22* 2.20*  GLUCOSE 112* 105* 88   Electrolytes  Recent Labs Lab 05/15/14 0558 05/16/14 0500 05/17/14 0500 05/18/14 0500  CALCIUM 8.3* 8.5 8.0* 8.0*  MG 2.2  --   --   --   PHOS 9.2*  --   --   --     Sepsis Markers  Recent Labs Lab 05/16/14 0500 05/16/14 1130 05/17/14 1353  LATICACIDVEN 1.3 1.1 0.8   ABG  Recent Labs Lab 05/15/14 0631 05/15/14 1358 05/16/14 0500  PHART 7.019* 7.254* 7.349*  PCO2ART 51.5* 37.6 35.5  PO2ART 151.0* 102.0* 79.9*   Liver Enzymes  Recent Labs Lab 05/15/14 0558 05/16/14 0500 05/17/14 0500  AST 96* 112* 101*  ALT 73* 57* 37  ALKPHOS 173* 149* 119*  BILITOT <0.2* 0.3 0.4  ALBUMIN 2.4* 2.7* 2.2*    Cardiac Enzymes  Recent Labs Lab 05/16/14 1249 05/16/14 1602 05/16/14 2258  TROPONINI >20.00* >20.00* 17.50*    Glucose  Recent Labs Lab 05/15/14 0525 05/15/14 0550 05/15/14 0825  GLUCAP 83 151* 292*    CXR: NACPD   ASSESSMENT / PLAN:  PULMONARY ETT 10/23 >> 10/24 A: Acute respiratory failure  post cardiac arrest-extubated. Hx of smoking.   P:   Wean FIO2 - on room air Cont pulse ox    CARDIOVASCULAR L Thomas [name] CVL 10/23 >> 10/24 A:  Prolonged cardiac arrest - VT/VF/PEA  NSTEMI, CAD on cath Shock.  ? Cardiogenic vs volume related -->initially favor volume as shock resolved w/ normal saline, but sepsis on diff dx given GNR in urine  Echo -nmlLV fn, mod pulm htn P:  Cont tele  Cards consulted - to cath 10/26 - will need intervention -if so, may need dual antiplt therapy    RENAL A:   AKI, nonoliguric CKD s/p Rt nephrectomy Severe lactic acidosis -resolved Hyperphosphatemia Hypokalemia  P:    Monitor BMET  intermittently Monitor I/Os Correct electrolytes as indicated - K given this AM Hydrate post contrast   GASTROINTESTINAL A:   Elevated LFTs-->improved; likely d/t shock liver  P:   Heart healthy/carb modified diet - NPO pre-cath this AM   HEMATOLOGIC A:   Hx of Hodkin's disease. Thrombocytopenia Mild neutropenia P:  SCD for DVT - will switch heparin to lovenox post-cath Trend CBC  INFECTIOUS A:   HIV Pan-sensitive Klebsiella UTI P:   Blood Cx 10/23 >>ng Urine 10/23 K. pneumoniae Rocephin 10/25>>> Cont antivirals  ENDOCRINE A:   Stress induced hyperglycemia - resolved P:   D/C SSI Monitor glu on BMET  NEUROLOGIC A:   Post anoxic encephalopathy  Has some short term memory deficits  P:   RASS goal: n/a  Supportive care  Will have SLP see for cognitive therapy; he will likely need rehab  PT eval and treat  FAMILY  - Updates: mother 10/24  - Inter-disciplinary family meet or Palliative Care meeting due by: 10/30   Thomas B. Bonner Puna, MD, PGY-2  TODAY'S SUMMARY:  Unclear cause of arrest, nml qTc, improve with some memory deficits, will need stenting for 2V CAD eventually  Care during the described time interval was provided by me and/or other providers on the critical care team.  I have reviewed this patient's available data, including medical history, events of note, physical examination and test results as part of my evaluation  Thomas [name],Thomas V. MD  05/18/2014 7:45 AM

## 2014-05-18 NOTE — Progress Notes (Signed)
[facility] ADULT ICU REPLACEMENT PROTOCOL FOR AM LAB REPLACEMENT ONLY  The patient does apply for the [facility] Adult ICU Electrolyte Replacment Protocol based on the criteria listed below:   1. Is GFR >/= 40 ml/min? Yes.    Patient's GFR today is 44 2. Is urine output >/= 0.5 ml/kg/hr for the last 6 hours? Yes.   Patient's UOP is 5 ml/kg/hr 3. Is BUN < 60 mg/dL? Yes.    Patient's BUN today is 12 4. Abnormal electrolyte(s): K 3.2 5. Ordered repletion with: per protocol 6. If a panic level lab has been reported, has the CCM MD in charge been notified? No..   Physician:    Ronda Fairly A 05/18/2014 6:55 AM

## 2014-05-18 NOTE — Evaluation (Signed)
Clinical/Bedside Swallow Evaluation Patient Details  Name: Thomas [name] MRN: [id number] Date of Birth: 08-14-1980  Today's Date: 05/18/2014 Time: 1607-3710 SLP Time Calculation (min): 22 min  Past Medical History:  Past Medical History  Diagnosis Date  . HIV disease 06/25/2012  . Skin lesion 07/03/2012  . Leg pain 07/03/2012  . Sinus tachycardia 08/06/2012  . Penile cyst 08/06/2012  . History of blood transfusion july 2015 per pt  . Cancer     hodgkins, no current treatment for   Past Surgical History:  Past Surgical History  Procedure Laterality Date  . 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  this after several previous I and Ds of rectal abcess.   [name] node biopsy Right 08/08/2013    Procedure: SUPRACLAVICULAR LYMPH NODE BIOPSY;  Surgeon: Gaye Pollack, MD;  Location: [place];  Service: Thoracic;  Laterality: Right;  . 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 Tee without cardioversion N/A 02/27/2014    Procedure: TRANSESOPHAGEAL ECHOCARDIOGRAM (TEE);  Surgeon: Pixie Casino, MD;  Location: [place];  Service: Cardiovascular;  Laterality: N/A;  . Pac placed and later removed    . Right nephrostomy tube  for last month and 1/2  . Laparoscopic nephrectomy Right 04/30/2014    Procedure: LAPAROSCOPIC NEPHRECTOMY AND URETERECTOMY;  Surgeon: Raynelle Bring, MD;  Location: [facility];  Service: Urology;  Laterality: Right;   HPI:  33 yo male former smoker noted to have seizure like activity at home. On arrival by EMS pt was in VT/VF/PEA. Approximately 1 hour for ROSC. 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 PMH also significant for HIV and Stage IIIA Hodgkin's disease.   Assessment / Plan / Recommendation Clinical Impression  Pt's oropharyngeal swallow appears WFL with adequate timing coordination, and strength. No overt signs of aspiration observed. No further SLP f/u recommended for  swallowing at this time, although MD may wish to consider ordering SLP cogntiive-linguistic evaluation due to decreased recall of new information and sustained attention observed today.    Aspiration Risk  Mild    Diet Recommendation Regular;Thin liquid   Liquid Administration via: Cup;Straw Medication Administration: Whole meds with liquid Supervision: Patient able to self feed;Intermittent supervision to cue for compensatory strategies Compensations: Slow rate;Small sips/bites Postural Changes and/or Swallow Maneuvers: Seated upright 90 degrees    Other  Recommendations Oral Care Recommendations: Oral care BID   Follow Up Recommendations  Other (comment) (noen for swallow, would benefit from SLE)    Frequency and Duration        Pertinent Vitals/Pain n/a    SLP Swallow Goals     Swallow Study Prior Functional Status       General Date of Onset: 05/15/14 HPI: 33 yo male former smoker noted to have seizure like activity at home. On arrival by EMS pt was in VT/VF/PEA. Approximately 1 hour for ROSC. 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 PMH also significant for HIV and Stage IIIA Hodgkin's disease. Type of Study: Bedside swallow evaluation Previous Swallow Assessment: none in chart Diet Prior to this Study: Regular;Thin liquids Temperature Spikes Noted: Yes (low grade) Respiratory Status: Room air History of Recent Intubation: Yes Length of Intubations (days): 2 days Date extubated: 05/16/14 Behavior/Cognition: Alert;Cooperative;Other (comment) (decreased recall) Oral Cavity - Dentition: Adequate natural dentition Self-Feeding Abilities: Able to feed self Patient Positioning: Upright in bed Baseline Vocal Quality: Clear  Oral/Motor/Sensory Function Overall Oral Motor/Sensory Function: Appears within functional limits for tasks assessed (during functional tasks)   [employer] chips: Not tested   Thin Liquid Thin Liquid: Within  functional limits Presentation: Cup;Self Fed;Straw    Nectar Thick Nectar Thick Liquid: Not tested   Honey Thick Honey Thick Liquid: Not tested   Puree Puree: Within functional limits Presentation: Self Fed;Spoon   Solid   GO    Solid: Within functional limits Presentation: Self Fed        Germain Osgood, M.A. CCC-SLP [phone]  Germain Osgood 05/18/2014,2:06 PM

## 2014-05-18 NOTE — Progress Notes (Signed)
Thomas [name]   DOB:10-14-1980   EQ#:[id number]   WLN#:[id number]  Patient Care Team: Philis Fendt, MD as PCP - General (Internal Medicine) [name], MD as PCP - Infectious Diseases (Infectious Diseases) [name], MD (Infectious Diseases)  Subjective:  33 year old African-American male with history of HIV disease and Hodgkin's disease, admitted to [facility] with cardiac arrest-Vfib/ NSTEMI/ PEA- requiring defibrillation and pressors. He was brought by EMS. Patient underwent emergent cardiac catheterization, found to have high grade Cx stenosis which will require stent placement. Patient's platelets on admission were 206 k. He was placed on Lovenox, his platelets today are 92,000. He is also on ASA post catheterization. He denies chest pain at this time or respiratory issues. He is fatigued. No new acute events today.While he had nausea prior to admission, this is now resolved.  We have been asked to evaluate thrombocytopenia prior to procedure.    Principle Diagnosis: 33 year old HIV positive gentleman diagnosed with Hodgkin's disease in January of 2015 after he presented with anemia and diffuse lymphadenopathy. Stage IIIA disease. He has completed 4 cycles of ABVD and got day 1 of cycle 5 on 02/13/2014 and neulasta on 02/14/2014. He was not able to complete the planned 6 cycles. Pet scan on 12/10/13 showed a near-complete response.  Prior Therapy: He is status post supraclavicular right lymph node biopsy done on 08/02/2013.   Current therapy: Systemic chemotherapy in the form of ABVD started on 10/03/2013. S/p  cycle 5 (day 1) on 02/13/14     Scheduled Meds: . Abacavir-Dolutegravir-Lamivud  1 tablet Per Tube Daily  . [START ON 05/19/2014] aspirin  81 mg Oral Pre-Cath  . atorvastatin  80 mg Oral q1800  . cefTRIAXone (ROCEPHIN)  IV  1 g Intravenous Q24H  . [START ON 05/19/2014] enoxaparin (LOVENOX) injection  40 mg Subcutaneous Q24H  . feeding supplement (ENSURE COMPLETE)  237  mL Oral TID BM  . ferrous sulfate  325 mg Oral BID WC  . metoprolol tartrate  25 mg Oral BID  . potassium chloride  30 mEq Oral Q4H  . sodium chloride  3 mL Intravenous Q12H   Continuous Infusions: . sodium chloride 150 mL/hr at 05/18/14 0416  . sodium chloride 1 mL/kg/hr (05/18/14 1140)  . dextrose 5 % and 0.45% NaCl Stopped (05/18/14 0416)   PRN Meds:sodium chloride, docusate, docusate, ondansetron (ZOFRAN) IV, sodium chloride   Objective:  Filed Vitals:   05/18/14 1027  BP:   Pulse: 73  Temp:   Resp:       Intake/Output Summary (Last 24 hours) at 05/18/14 1146 Last data filed at 05/18/14 0900  Gross per 24 hour  Intake 4258.46 ml  Output   3400 ml  Net 858.46 ml    ECOG PERFORMANCE STATUS: 2-3  GENERAL:alert, no distress and comfortable, slowed mentation SKIN: skin color, texture, turgor are normal, no rashes or significant lesions EYES: normal, conjunctiva are pink and non-injected, sclera clear OROPHARYNX:no exudate, no erythema and lips, buccal mucosa, and tongue normal  NECK: supple, thyroid normal size, non-tender, without nodularity LYMPH:  no palpable lymphadenopathy in the cervical, axillary or inguinal LUNGS: clear to auscultation and percussion with normal breathing effort HEART: regular rate & rhythm and no murmurs and no lower extremity edema ABDOMEN:abdomen soft, non-tender and normal bowel sounds Musculoskeletal:no cyanosis of digits and no clubbing  PSYCH: alert & oriented x 3 with fluent speech 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    CBG (last 3)  No results found for this basename: GLUCAP,  in the last 72 hours   Labs:   Recent Labs Lab 05/15/14 0558 05/15/14 0624 05/16/14 0500 05/17/14 0500 05/18/14 0500  WBC 18.0*  --  8.5 4.0 3.5*  HGB 12.5* 13.3 12.0* 9.0* 9.2*  HCT 38.1* 39.0 34.9* 26.3* 26.9*  PLT 206  --  162 83* 92*  MCV 104.4*  --  98.9 95.6 96.1  MCH 34.2*  --  34.0 32.7 32.9  MCHC 32.8  --  34.4 34.2 34.2  RDW  13.2  --  13.5 13.4 13.3  LYMPHSABS 9.2*  --   --   --   --   MONOABS 0.7  --   --   --   --   EOSABS 0.2  --   --   --   --   BASOSABS 0.0  --   --   --   --      Chemistries:    Recent Labs Lab 05/15/14 0558 05/15/14 0624 05/16/14 0500 05/17/14 0500 05/18/14 0500  NA 143 140 140 141 140  K 4.1 3.7 3.9 3.5* 3.2*  CL 104 110 107 108 110  CO2 11*  --  21 20 20   GLUCOSE 367* 363* 112* 105* 88  BUN 22 26* 26* 18 12  CREATININE 2.14* 2.20* 2.34* 2.22* 2.20*  CALCIUM 8.3*  --  8.5 8.0* 8.0*  MG 2.2  --   --   --   --   AST 96*  --  112* 101*  --   ALT 73*  --  57* 37  --   ALKPHOS 173*  --  149* 119*  --   BILITOT <0.2*  --  0.3 0.4  --     GFR Estimated Creatinine Clearance: 46 ml/min (by C-G formula based on Cr of 2.2).  Liver Function Tests:  Recent Labs Lab 05/15/14 0558 05/16/14 0500 05/17/14 0500  AST 96* 112* 101*  ALT 73* 57* 37  ALKPHOS 173* 149* 119*  BILITOT <0.2* 0.3 0.4  PROT 6.2 6.5 5.8*  ALBUMIN 2.4* 2.7* 2.2*   No results found for this basename: LIPASE, AMYLASE,  in the last 168 hours No results found for this basename: AMMONIA,  in the last 168 hours  Urine Studies     Component Value Date/Time   COLORURINE YELLOW 05/15/2014 0614   APPEARANCEUR TURBID* 05/15/2014 0614   LABSPEC 1.016 05/15/2014 0614   PHURINE 7.0 05/15/2014 0614   GLUCOSEU 500* 05/15/2014 0614   HGBUR LARGE* 05/15/2014 [place] 05/15/2014 0614   [place] 05/15/2014 0614   PROTEINUR >300* 05/15/2014 0614   UROBILINOGEN 0.2 05/15/2014 0614   NITRITE NEGATIVE 05/15/2014 0614   LEUKOCYTESUR SMALL* 05/15/2014 0614    Coagulation profile  Recent Labs Lab 05/15/14 0558  INR 1.39    Cardiac Enzymes:  Recent Labs Lab 05/15/14 1800 05/15/14 2359 05/16/14 1249 05/16/14 1602 05/16/14 2258  TROPONINI >20.00* >20.00* >20.00* >20.00* 17.50*   BNP: No components found with this basename: POCBNP,  CBG:  Recent Labs Lab  05/15/14 0525 05/15/14 0550 05/15/14 0825  GLUCAP 83 151* 292*      Imaging Studies:  No results found.  Assessment/Plan: 33 y.o.  Hodgkin's disease  presented with lymphadenopathy above and below the diaphragm as well as bone marrow involvement without any constitutional symptoms indicating at least stage IIIA.  He is status post 3 cycles of ABVD with a PET/CT scan on 12/10/2013 showed near-complete response.  He is s/p cycle 5 (day 1)  of 6, which was scheduled for 9/8-9/15, delayed due to recent hospitalization for sepsis with MSSA bacteremia and acute renal failure, and current hospitalization with cardiac issues needing  catheterization.  Plans are continuing to be on hold until clinical status improves.   NSTEMI patient was admitted due to V Fib arrest/ PEA requiring defibrillation s/p left heart catheterization on 10/26  He will need stenting to the ramus intermediate/high OM1 and also to the mid to distal circumflex coronary artery. Continue plans for stenting from the hematology standpoint as Platelets are 92,,000. I have spoken with Dr Alen Blew and he is agreeable to going forward with cardiac intervention and he will follow patient in clinic on outpatient basis for his thrombocytopenia and also overall for his hodgkin's lymphoma. Dual antiplatelet therapy is recommended after procedure, with absolute compliance to the meds   Thrombocytopenia:  Likely related to acute illness, recent sepsis and cardiac disease (NSTEMI).  He is on Lovenox. Recommend for the patient to be placed on dual antiplatelet therapy as clinically indicated by Cardiology. Current value is 92,000 without bleeding issues. Continue to monitor. No transfusion is indicated at this time.    Anemia:  Secondary to his Hodgkin's disease and recent cardiac issues. He does not require any intervention.  No need for any packed red cell transfusions.  Continue oral iron therapy   Renal Failure s/p right  nephrectomy in October 2015, Dr. Alinda Money  HIV:  He is on HIV medsand followed by infectious disease service.   Disposition Palliative Care meeting  by 10/30 to discuss goals of care. He is currently Full Code. Follow up with Dr Alen Blew outpatient upon discharge.  Other medical issues as per admitting team    [place], PA-C 05/18/2014  11:46 AM  I have seen and examined patient and agree with above note and made some edits in note. Send Heparin PF4 antibody to r/o Heparin induced thrombocytopenia. Check CBC/Platelets daily and our team will follow.  Bernadene Bell, MD Medical Hematologist/Oncologist [place] Pager: [phone] Office No: [phone]

## 2014-05-18 NOTE — H&P (View-Only) (Signed)
CARDIOLOGY CONSULT NOTE  Patient ID: BLADEN UMAR MRN: [id number] DOB/AGE: 1980/12/29 33 y.o.  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 He has hx of Stage IIIA Hodgkin's disease s/p ABVD. His mother his medical history, states that on the day of admission at 4 AM he was having frequent episodes of nausea and vomiting and at one time she heard a loud moan, went to the patient's bedroom and found him on his stomach with convulsions like activity that lasted for about  Few seconds followed by total unresponsiveness leading to EMS activation.  The EMS arrived at the scene, he was found to be in what appears to be VF arrest  Where he was defibrillated on developed 3-4 times.  On arrival to the emergency room he was in pulseless electrical activity, and he was attempted to be resuscitated for almost an hour and finally patient returned to spontaneous circulation and breathing.  Patient was intubated and this morning was extubated as he was following all commands and was appropriate to verbal stimuli.  He was hypotensive and as stated with inotropes.  Patient CVP was found to be very low this morning,and has received fluid boluses leading to complete withdrawal of inotropic support.  Patient prior to passing out, had been having severe nausea frequent episodes of vomiting and after he threw up several times did complain of chest pain.  Patient presently states that he does not remember any of this but responds appropriately.  His mother was next to him states that he is presently doing well and undergoing chemotherapy and does go out to his friend's house, walking in the driveway, but does not have any significant sports like  activity.he quit smoking about 6 months ago, does not use any illicit drugs or drink alcohol.   Past Medical History  Diagnosis Date  . HIV disease 06/25/2012  . Skin lesion 07/03/2012  . Leg pain 07/03/2012  . Sinus tachycardia 08/06/2012  . Penile cyst 08/06/2012  . History of blood transfusion july 2015 per pt  . Cancer     hodgkins, no current treatment for     Past Surgical History  Procedure Laterality Date  . 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  this after several previous I and Ds of rectal abcess.   [name] node biopsy Right 08/08/2013    Procedure: SUPRACLAVICULAR LYMPH NODE BIOPSY;  Surgeon: Gaye Pollack, MD;  Location: [facility];  Service: Thoracic;  Laterality: Right;  . 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 Tee without cardioversion N/A 02/27/2014    Procedure: TRANSESOPHAGEAL ECHOCARDIOGRAM (TEE);  Surgeon: Pixie Casino, MD;  Location: [facility];  Service: Cardiovascular;  Laterality: N/A;  . Pac placed and later removed    . Right nephrostomy tube  for last month and 1/2  . Laparoscopic nephrectomy Right 04/30/2014    Procedure: LAPAROSCOPIC NEPHRECTOMY AND URETERECTOMY;  Surgeon: Raynelle Bring, MD;  Location: [facility];  Service: Urology;  Laterality: Right;     History reviewed. No pertinent family history.   Social History: History   Social History  . Marital Status: Single    Spouse Name: N/A    Number of Children: N/A  .  Years of Education: N/A   Occupational History  . Not on file.   Social History Main Topics  . Smoking status: Former Smoker -- 1.00 packs/day for 15 years    Types: Cigarettes    Start date: 08/24/2013    Quit date: 09/21/2013  . Smokeless tobacco: Never Used     Comment: quitline info, counseling  . Alcohol Use: No  . Drug Use: No     Comment: hasn't smoked marijuana in "a while".   . Sexual Activity: Not Currently    Partners: Female    Birth Control/  Protection: Condom     Comment: declined condoms   Other Topics Concern  . Not on file   Social History Narrative  . No narrative on file     Prescriptions prior to admission  Medication Sig Dispense Refill  . Abacavir-Dolutegravir-Lamivud 600-50-300 MG TABS Take 1 tablet by mouth at bedtime.      Marland Kitchen atenolol (TENORMIN) 25 MG tablet Take 25 mg by mouth daily.      . calcium carbonate (TUMS - DOSED IN MG ELEMENTAL CALCIUM) 500 MG chewable tablet Chew 4 tablets by mouth 3 (three) times daily.      Marland Kitchen docusate sodium (COLACE) 100 MG capsule Take 1 capsule (100 mg total) by mouth 2 (two) times daily.  30 capsule  0  . feeding supplement, ENSURE COMPLETE, (ENSURE COMPLETE) LIQD Take 237 mLs by mouth 3 (three) times daily between meals.      . ferrous sulfate 325 (65 FE) MG tablet Take 325 mg by mouth 2 (two) times daily with a meal.      . oxyCODONE (ROXICODONE) 5 MG immediate release tablet Take 1 tablet (5 mg total) by mouth every 4 (four) hours as needed for severe pain.  30 tablet  0  . prochlorperazine (COMPAZINE) 10 MG tablet Take 10 mg by mouth every 6 (six) hours as needed for nausea or vomiting.        Scheduled Meds: . Abacavir-Dolutegravir-Lamivud  1 tablet Per Tube Daily  . antiseptic oral rinse  7 mL Mouth Rinse QID  . chlorhexidine  15 mL Mouth Rinse BID  . pantoprazole (PROTONIX) IV  40 mg Intravenous Q24H   Continuous Infusions: . amiodarone 30 mg/hr (05/16/14 0900)  . dextrose 5 % and 0.45% NaCl 125 mL/hr at 05/16/14 1138  . norepinephrine (LEVOPHED) Adult infusion 10 mcg/min (05/16/14 1030)   PRN Meds:.docusate, docusate, ondansetron (ZOFRAN) IV  ROS: General: no fevers/chills/night sweats prior to the admission.  However he had severe nausea and vomiting.  On further questioning mother also states that he has severe pain in both his legs and has difficulty in moving his legs  And does not allow anybody to touch them due to pain in his legs.does not admit to any  neurologic deficits.  ROS is limited.    Physical Exam: Blood pressure 107/59, pulse 107, temperature 98.6 F (37 C), temperature source Oral, resp. rate 18, height 5' 7.32" (1.71 m), weight 69.2 kg (152 lb 8.9 oz), SpO2 99.00%.   General appearance: alert, cooperative, appears stated age, no distress and slowed mentation Lungs: clear to auscultation bilaterally Chest wall: no tenderness Heart: regular rate and rhythm, S1, S2 normal, no murmur, click, rub or gallop Abdomen: soft, non-tender; bowel sounds normal; no masses,  no organomegaly Extremities: difficult to analyze  extremities, patient was sleeping at an angle, when I asked him to move his lower extremities he states they hurt him and he appeared  to have motor weakness in the lower extremities and difficult to examine, mother states that he has had chronic pain in his legs and does not want me to examine completely. Pulses: 2+ and symmetric Neurologic: Mental status: Alert, oriented, thought content appropriate, affect: blunted, But responds appropriately Motor: Complete examination not performed,  appears to have bilateral lower extremity motor weakness.  Labs:   Lab Results  Component Value Date   WBC 8.5 05/16/2014   HGB 12.0* 05/16/2014   HCT 34.9* 05/16/2014   MCV 98.9 05/16/2014   PLT 162 05/16/2014    Recent Labs Lab 05/16/14 0500  NA 140  K 3.9  CL 107  CO2 21  BUN 26*  CREATININE 2.34*  CALCIUM 8.5  PROT 6.5  BILITOT 0.3  ALKPHOS 149*  ALT 57*  AST 112*  GLUCOSE 112*   Lab Results  Component Value Date   CKTOTAL 74 02/24/2014   TROPONINI >20.00* 05/16/2014    Lipid Panel     Component Value Date/Time   CHOL 112 08/03/2013 1414   TRIG 204* 08/03/2013 1414   HDL 17* 08/03/2013 1414   CHOLHDL 6.6 08/03/2013 1414   VLDL 41* 08/03/2013 1414   LDLCALC 54 08/03/2013 1414    EKG: normal EKG, normal sinus rhythm, unchanged from previous tracings.No evidence of ischemia, normal QT interval.  Borderline low  voltage complexes.    Radiology: Ct Head Wo Contrast  05/15/2014   CLINICAL DATA:  Status post cardiac arrest  EXAM: CT HEAD WITHOUT CONTRAST  TECHNIQUE: Contiguous axial images were obtained from the base of the skull through the vertex without intravenous contrast.  COMPARISON:  03/01/2014  FINDINGS: The bony calvarium is intact. No gross soft tissue abnormality is noted. The ventricles are of normal size and configuration. No significant extra-axial fluid collection is noted. No findings to suggest acute hemorrhage, acute infarction or space-occupying mass lesion are noted.  IMPRESSION: No acute intracranial abnormality at this time.   Electronically Signed   By: Inez Catalina M.D.   On: 05/15/2014 07:07   Dg Chest Port 1 View  05/16/2014   CLINICAL DATA:  Respiratory failure.  Intubated patient.  EXAM: PORTABLE CHEST - 1 VIEW  COMPARISON:  05/15/2014.  FINDINGS: Endotracheal tube tip lies 7.5 cm above the carina, without significant change. Nasogastric tube passes below the diaphragm into the stomach. Left subclavian central venous line tip lies in the lower superior vena cava.  Mild medial lung base opacity is noted consistent with atelectasis. Lungs are hyperexpanded but otherwise clear. No evidence of pneumonia or edema. No pleural effusion or pneumothorax.  IMPRESSION: 1. No acute cardiopulmonary disease.  Mild basilar atelectasis. 2. Support apparatus is stable as described.   Electronically Signed   By: Lajean Manes M.D.   On: 05/16/2014 08:26   Dg Chest Port 1 View  05/15/2014   CLINICAL DATA:  Shock.  EXAM: PORTABLE CHEST - 1 VIEW  COMPARISON:  05/15/2014 .  FINDINGS: Endotracheal tube, NG tube, left subclavian line in good anatomic position. Previously identified pulmonary infiltrates have almost completely cleared. Lungs are clear. No pleural effusion or pneumothorax. Heart size normal. No acute bony abnormality.  IMPRESSION: 1. Lines and tubes in good anatomic position. 2. Near complete  clearing of bilateral pulmonary infiltrates   Electronically Signed   By: [place]   On: 05/15/2014 12:24   Dg Chest Port 1 View  05/15/2014   CLINICAL DATA:  Cardiac arrest.  Cancer patient HIV.  EXAM: PORTABLE CHEST -  1 VIEW  COMPARISON:  02/23/2014  FINDINGS: Endotracheal tube tip measures 3.7 cm above the carinal. Shallow inspiration. Heart size and pulmonary vascularity are normal. Perihilar infiltration suggesting pneumonia or edema. No blunting of costophrenic angles. No pneumothorax.  IMPRESSION: Endotracheal tube tip measures 3.7 cm above the carina. Bilateral perihilar infiltrates. Shallow inspiration.   Electronically Signed   By: Lucienne Capers M.D.   On: 05/15/2014 06:29   Dg Abd Portable 1v  05/16/2014   CLINICAL DATA:  Orogastric tube placement initial evaluation  EXAM: PORTABLE ABDOMEN - 1 VIEW  COMPARISON:  None.  FINDINGS: Orogastric tube projects over the stomach in the left upper quadrant. Left diaphragm is mildly elevated.  IMPRESSION: Orogastric tube over the stomach   Electronically Signed   By: Skipper Cliche M.D.   On: 05/16/2014 07:46   ASSESSMENT AND PLAN:  1.  Cardiopulmonary arrest, history of any suspicious for cardiac event leading to seizure disorder on initial presentation. 2.  Type II NSTEMI, however although he had prolonged CPR for lasting an hour and in cardiogenic shock, underlying coronary artery disease eventually need to be excluded for appropriate management and diagnosis of his cardiac arrest/cardiopulmonary arrest. Recommendation: Patient is hemodynamically stable and responds appropriately.  He will eventually need coronary angiography to definitively exclude CAD.  I will evaluate his echocardiogram, suspect he probably has normal left ventricle systolic function and his hypotension was clearly related to dehydration, patient's CVP was 6 earlier this morning. Hypotension responded to fluid resuscitation. However there is no urgency for proceeding  with coronary angiography, EKG does not reveal any acute ST-T wave changes of ischemia.  He does not appear to be on any medications that can cause QT prolongation, however the antiretroviral therapy ,Triumeq that the patient is on can potentially cause myocardial infarction and rhabdomyolysis.  At this point would recommend continuing his amiodarone until his ejection fraction was evaluated by echocardiogram.  Laverda Page, MD 05/16/2014, 6:09 PM [facility] [place] Pager: [phone] Office: [phone] If no answer Cell [phone]

## 2014-05-18 NOTE — CV Procedure (Addendum)
Procedure performed:  Left heart catheterization including hemodynamic monitoring of the left ventricle, LV gram, selective right and left coronary arteriography.  Indication patient is a 33 year-old African-American male with history of HIV disease, history of chronic renal insufficiency,  right ureteric calculi leading to right kidney hydronephrosis, history of lymphoma presently on chemotherapy, bilateral leg weakness, enhanced lesion at the spine, felt to be sepsis versus metastatic disease/lymphoma, was admitted to the hospital with cardiac arrest with positive cardiac markers for non-ST elevation myocardial infarction. He is now brought to the coronary in general feels weak to evaluate his coronary anatomy.  Hemodynamic data:  Left ventricular pressure was 102/6 with LVEDP of 17 mm mercury. Aortic pressure was 102/76 with a mean of 89 mm mercury. There was no pressure gradient across the aortic valve  Left ventricle: Performed in the RAO projection revealed LVEF of 55%. There was No significant  MR. No wall motion abnormality. Evaluation of mitral regurgitation is incomplete due to minimal contrast was.  Right coronary artery: The RCA is large, proximal segment has a 40% long diffuse stenosis, no high-grade stenosis evident. It is dominant. Faint collaterals to the circumflex quality artery evident.  Left main coronary artery is large and normal.  Circumflex coronary artery: A large vessel giving origin to a large high obtuse marginal 1(ramus intermediate) which is a high-grade hazy 90% stenosis. It is a large vessel which supplies large lateral wall. Rest of the ramus/OM1 is smooth and normal.  The circumflex gives origin to several small to marginal's. The mid to distal segment of the circumflex is diffusely diseased. The distal circumflex at the bifurcation of OM for a distal circumflex has a 95% stenosis. Proximal to that there is a 70% stenosis.  LAD:  LAD gives origin to a large  diagonal-1. Large vessel, smooth and normal. It wraps around the apex.  Recommendation: Extremely difficult situation, given patient's multiple medical comorbidities, I did not perform any intervention at this point. I will start the patient on ASA and beta blockers, will discuss with oncology regarding further management options especially in view of recent thrombocytopenia, chronic anemia and need for continued therapy for lymphoma, use of dual antiplatelet therapy on a long run may be extremely difficult. He will need stenting to the ramus intermediate/high OM1 and also to the mid to distal circumflex coronary artery. A total of 30 mL of contrast was utilized for diagnostic angiography.   Technique: Under sterile precautions using a 6 French right radial  arterial access, a 6 French sheath was introduced into the right radial artery. A 5 [place] Tig 4 catheter was advanced into the ascending aorta selective  right coronary artery and left coronary artery was cannulated and angiography was performed in multiple views. The catheter was pulled back Out of the body over exchange length J-wire.  Same Catheter was used to perform LV gram which was performed in RAO projection.  Catheter exchanged out of the body over J-Wire. NO immediate complications noted. Patient tolerated the procedure well.

## 2014-05-18 NOTE — Interval H&P Note (Signed)
History and Physical Interval Note:  05/18/2014 10:24 AM  Thomas [name]  has presented today for surgery, with the diagnosis of NSTEMI  The various methods of treatment have been discussed with the patient and family. After consideration of risks, benefits and other options for treatment, the patient has consented to  Procedure(s): LEFT HEART CATHETERIZATION WITH CORONARY ANGIOGRAM (N/A) possible PCI  as a surgical intervention .  The patient's history has been reviewed, patient examined, no change in status, stable for surgery.  I have reviewed the patient's chart and labs.  Questions were answered to the patient's satisfaction.   Cath Lab Visit (complete for each Cath Lab visit)  Clinical Evaluation Leading to the Procedure:   ACS: Yes.    Non-ACS:    Anginal Classification: CCS IV  Anti-ischemic medical therapy: No Therapy, patient with cardiac arrest and soft BP and hence not on therapy. Amiodarone discontinued yesterday  Non-Invasive Test Results: No non-invasive testing performed  Prior CABG: No previous CABG        [facility]

## 2014-05-18 NOTE — Progress Notes (Signed)
PT Cancellation Note  Patient Details Name: Thomas [name] MRN: [id number] DOB: Feb 27, 1981   Cancelled Treatment:    Reason Eval/Treat Not Completed: Medical issues which prohibited therapy (pt going for cardiac cath at 9am and Rn request hold. Will attempt next date)   Melford Aase 05/18/2014, 8:02 AM Elwyn Reach, [place]

## 2014-05-19 ENCOUNTER — Inpatient Hospital Stay (HOSPITAL_COMMUNITY): Payer: Medicare Other

## 2014-05-19 ENCOUNTER — Encounter (HOSPITAL_COMMUNITY)
Admission: EM | Disposition: A | Discharge status: Home / Self Care | Payer: Medicare Other | Source: Home / Self Care | Attending: Pulmonary Disease

## 2014-05-19 ENCOUNTER — Encounter (HOSPITAL_COMMUNITY): Payer: Self-pay | Admitting: General Practice

## 2014-05-19 DIAGNOSIS — R7401 Elevation of levels of liver transaminase levels: Secondary | ICD-10-CM

## 2014-05-19 DIAGNOSIS — M79604 Pain in right leg: Secondary | ICD-10-CM

## 2014-05-19 DIAGNOSIS — J9601 Acute respiratory failure with hypoxia: Secondary | ICD-10-CM

## 2014-05-19 DIAGNOSIS — I214 Non-ST elevation (NSTEMI) myocardial infarction: Secondary | ICD-10-CM | POA: Diagnosis present

## 2014-05-19 DIAGNOSIS — N189 Chronic kidney disease, unspecified: Secondary | ICD-10-CM

## 2014-05-19 DIAGNOSIS — N390 Urinary tract infection, site not specified: Secondary | ICD-10-CM

## 2014-05-19 DIAGNOSIS — R740 Nonspecific elevation of levels of transaminase and lactic acid dehydrogenase [LDH]: Secondary | ICD-10-CM

## 2014-05-19 HISTORY — DX: Elevation of levels of liver transaminase levels: R74.01

## 2014-05-19 HISTORY — DX: Non-ST elevation (NSTEMI) myocardial infarction: I21.4

## 2014-05-19 HISTORY — PX: CARDIAC CATHETERIZATION: SHX172

## 2014-05-19 HISTORY — DX: Acute respiratory failure with hypoxia: J96.01

## 2014-05-19 HISTORY — DX: Chronic kidney disease, unspecified: N18.9

## 2014-05-19 HISTORY — PX: PERCUTANEOUS CORONARY STENT INTERVENTION (PCI-S): SHX5485

## 2014-05-19 HISTORY — PX: CORONARY STENT PLACEMENT: SHX1402

## 2014-05-19 LAB — COMPREHENSIVE METABOLIC PANEL
ALK PHOS: 122 U/L — AB (ref 39–117)
ALT: 22 U/L (ref 0–53)
ANION GAP: 9 (ref 5–15)
AST: 60 U/L — ABNORMAL HIGH (ref 0–37)
Albumin: 2.3 g/dL — ABNORMAL LOW (ref 3.5–5.2)
BUN: 10 mg/dL (ref 6–23)
CO2: 22 mEq/L (ref 19–32)
Calcium: 8.3 mg/dL — ABNORMAL LOW (ref 8.4–10.5)
Chloride: 110 mEq/L (ref 96–112)
Creatinine, Ser: 2.07 mg/dL — ABNORMAL HIGH (ref 0.50–1.35)
GFR calc Af Amer: 48 mL/min — ABNORMAL LOW (ref >90)
GFR calc non Af Amer: 41 mL/min — ABNORMAL LOW (ref >90)
Glucose, Bld: 90 mg/dL (ref 70–99)
POTASSIUM: 3.9 meq/L (ref 3.7–5.3)
Sodium: 141 mEq/L (ref 137–147)
TOTAL PROTEIN: 6.1 g/dL (ref 6.0–8.3)
Total Bilirubin: 0.4 mg/dL (ref 0.3–1.2)

## 2014-05-19 LAB — CBC
HCT: 29 % — ABNORMAL LOW (ref 39.0–52.0)
HEMATOCRIT: 30.7 % — AB (ref 39.0–52.0)
HEMATOCRIT: 33.2 % — AB (ref 39.0–52.0)
HEMOGLOBIN: 10.5 g/dL — AB (ref 13.0–17.0)
HEMOGLOBIN: 9.8 g/dL — AB (ref 13.0–17.0)
Hemoglobin: 11.4 g/dL — ABNORMAL LOW (ref 13.0–17.0)
MCH: 32.9 pg (ref 26.0–34.0)
MCH: 33.3 pg (ref 26.0–34.0)
MCH: 33.6 pg (ref 26.0–34.0)
MCHC: 33.8 g/dL (ref 30.0–36.0)
MCHC: 34.2 g/dL (ref 30.0–36.0)
MCHC: 34.3 g/dL (ref 30.0–36.0)
MCV: 97.3 fL (ref 78.0–100.0)
MCV: 97.5 fL (ref 78.0–100.0)
MCV: 97.9 fL (ref 78.0–100.0)
Platelets: 105 10*3/uL — ABNORMAL LOW (ref 150–400)
Platelets: 113 10*3/uL — ABNORMAL LOW (ref 150–400)
Platelets: 120 10*3/uL — ABNORMAL LOW (ref 150–400)
RBC: 2.98 MIL/uL — ABNORMAL LOW (ref 4.22–5.81)
RBC: 3.15 MIL/uL — ABNORMAL LOW (ref 4.22–5.81)
RBC: 3.39 MIL/uL — AB (ref 4.22–5.81)
RDW: 13 % (ref 11.5–15.5)
RDW: 13.1 % (ref 11.5–15.5)
RDW: 13.1 % (ref 11.5–15.5)
WBC: 3.3 10*3/uL — AB (ref 4.0–10.5)
WBC: 3 10*3/uL — AB (ref 4.0–10.5)
WBC: 2.7 10*3/uL — ABNORMAL LOW (ref 4.0–10.5)

## 2014-05-19 LAB — BASIC METABOLIC PANEL
Anion gap: 11 (ref 5–15)
BUN: 9 mg/dL (ref 6–23)
CHLORIDE: 108 meq/L (ref 96–112)
CO2: 21 meq/L (ref 19–32)
Calcium: 8.6 mg/dL (ref 8.4–10.5)
Creatinine, Ser: 2.11 mg/dL — ABNORMAL HIGH (ref 0.50–1.35)
GFR calc Af Amer: 46 mL/min — ABNORMAL LOW (ref >90)
GFR calc non Af Amer: 40 mL/min — ABNORMAL LOW (ref >90)
GLUCOSE: 90 mg/dL (ref 70–99)
POTASSIUM: 3.6 meq/L — AB (ref 3.7–5.3)
Sodium: 140 mEq/L (ref 137–147)

## 2014-05-19 LAB — URINALYSIS, ROUTINE W REFLEX MICROSCOPIC
Bilirubin Urine: NEGATIVE
GLUCOSE, UA: NEGATIVE mg/dL
Ketones, ur: NEGATIVE mg/dL
LEUKOCYTES UA: NEGATIVE
Nitrite: NEGATIVE
PROTEIN: NEGATIVE mg/dL
SPECIFIC GRAVITY, URINE: 1.013 (ref 1.005–1.030)
Urobilinogen, UA: 0.2 mg/dL (ref 0.0–1.0)
pH: 7.5 (ref 5.0–8.0)

## 2014-05-19 LAB — PHOSPHORUS: Phosphorus: 2.3 mg/dL (ref 2.3–4.6)

## 2014-05-19 LAB — PROTEIN / CREATININE RATIO, URINE
CREATININE, URINE: 23.4 mg/dL
Protein Creatinine Ratio: 0.61 — ABNORMAL HIGH (ref 0.00–0.15)
Total Protein, Urine: 14.3 mg/dL

## 2014-05-19 LAB — CREATININE, SERUM
Creatinine, Ser: 1.97 mg/dL — ABNORMAL HIGH (ref 0.50–1.35)
GFR, EST AFRICAN AMERICAN: 50 mL/min — AB (ref >90)
GFR, EST NON AFRICAN AMERICAN: 44 mL/min — AB (ref >90)

## 2014-05-19 LAB — CK: Total CK: 3146 U/L — ABNORMAL HIGH (ref 7–232)

## 2014-05-19 LAB — URINE MICROSCOPIC-ADD ON

## 2014-05-19 LAB — MAGNESIUM: MAGNESIUM: 1.5 mg/dL (ref 1.5–2.5)

## 2014-05-19 LAB — LACTATE DEHYDROGENASE: LDH: 350 U/L — AB (ref 94–250)

## 2014-05-19 LAB — URIC ACID: URIC ACID, SERUM: 3.2 mg/dL — AB (ref 4.0–7.8)

## 2014-05-19 LAB — POCT ACTIVATED CLOTTING TIME: Activated Clotting Time: 354 seconds

## 2014-05-19 SURGERY — PERCUTANEOUS CORONARY STENT INTERVENTION (PCI-S)
Anesthesia: LOCAL

## 2014-05-19 MED ORDER — SODIUM CHLORIDE 0.9 % IV SOLN: 250.0000 mL | INTRAVENOUS | Status: DC | PRN

## 2014-05-19 MED ORDER — FENTANYL CITRATE 0.05 MG/ML IJ SOLN
INTRAMUSCULAR | Status: AC
  Filled 2014-05-19: qty 2

## 2014-05-19 MED ORDER — HEPARIN (PORCINE) IN NACL 2-0.9 UNIT/ML-% IJ SOLN
INTRAMUSCULAR | Status: AC
  Filled 2014-05-19: qty 1000

## 2014-05-19 MED ORDER — MAGNESIUM SULFATE 50 % IJ SOLN
3.0000 g | Freq: Once | INTRAVENOUS | Status: AC
  Administered 2014-05-19: 3 g via INTRAVENOUS
  Filled 2014-05-19: qty 6

## 2014-05-19 MED ORDER — SODIUM CHLORIDE 0.9 % IV SOLN
INTRAVENOUS | Status: AC
  Administered 2014-05-19 – 2014-05-20 (×3): via INTRAVENOUS

## 2014-05-19 MED ORDER — POTASSIUM CHLORIDE CRYS ER 20 MEQ PO TBCR
40.0000 meq | EXTENDED_RELEASE_TABLET | Freq: Once | ORAL | Status: AC
  Administered 2014-05-19: 40 meq via ORAL
  Filled 2014-05-19: qty 2

## 2014-05-19 MED ORDER — ASPIRIN 81 MG PO CHEW: 81.0000 mg | CHEWABLE_TABLET | ORAL | Status: DC

## 2014-05-19 MED ORDER — SILVER SULFADIAZINE 1 % EX CREA
TOPICAL_CREAM | Freq: Two times a day (BID) | CUTANEOUS | Status: DC
  Administered 2014-05-19: 23:00:00 via TOPICAL
  Administered 2014-05-20: 1 via TOPICAL
  Administered 2014-05-20 – 2014-05-21 (×2): via TOPICAL
  Filled 2014-05-19 (×2): qty 85

## 2014-05-19 MED ORDER — TICAGRELOR 90 MG PO TABS
90.0000 mg | ORAL_TABLET | Freq: Two times a day (BID) | ORAL | Status: DC
  Administered 2014-05-19 – 2014-05-21 (×4): 90 mg via ORAL
  Filled 2014-05-19 (×5): qty 1

## 2014-05-19 MED ORDER — TICAGRELOR 90 MG PO TABS
180.0000 mg | ORAL_TABLET | Freq: Once | ORAL | Status: AC
  Administered 2014-05-19: 180 mg via ORAL
  Filled 2014-05-19: qty 2

## 2014-05-19 MED ORDER — BIVALIRUDIN 250 MG IV SOLR
INTRAVENOUS | Status: AC
  Filled 2014-05-19: qty 250

## 2014-05-19 MED ORDER — LIDOCAINE HCL (PF) 1 % IJ SOLN
INTRAMUSCULAR | Status: AC
  Filled 2014-05-19: qty 30

## 2014-05-19 MED ORDER — SODIUM CHLORIDE 0.9 % IJ SOLN: 3.0000 mL | INTRAMUSCULAR | Status: DC | PRN

## 2014-05-19 MED ORDER — NITROGLYCERIN 1 MG/10 ML FOR IR/CATH LAB
INTRA_ARTERIAL | Status: AC
Start: 2014-05-19 — End: 2014-05-19
  Filled 2014-05-19: qty 10

## 2014-05-19 MED ORDER — NITROGLYCERIN 0.2 MG/ML ON CALL CATH LAB
INTRAVENOUS | Status: AC
  Filled 2014-05-19: qty 1

## 2014-05-19 MED ORDER — VERAPAMIL HCL 2.5 MG/ML IV SOLN
INTRAVENOUS | Status: AC
  Filled 2014-05-19: qty 2

## 2014-05-19 MED ORDER — MIDAZOLAM HCL 2 MG/2ML IJ SOLN
INTRAMUSCULAR | Status: AC
Start: 2014-05-19 — End: 2014-05-19
  Filled 2014-05-19: qty 2

## 2014-05-19 MED ORDER — SODIUM CHLORIDE 0.9 % IV SOLN: INTRAVENOUS | Status: DC

## 2014-05-19 MED ORDER — SODIUM CHLORIDE 0.9 % IJ SOLN: 3.0000 mL | Freq: Two times a day (BID) | INTRAMUSCULAR | Status: DC

## 2014-05-19 MED ORDER — SODIUM CHLORIDE 0.9 % IV SOLN
INTRAVENOUS | Status: DC
  Administered 2014-05-19: 12:00:00 via INTRAVENOUS

## 2014-05-19 NOTE — CV Procedure (Signed)
Procedure performed:  Left coronary arteriography. PTCA and Stenting  of the OM1 and proximal Circumflex with a 3.0 x 16 mm Promus Premier DES, scoring balloon angioplasty of the mid and distal circumflex quality artery with a 2.5 x 10 mm Angiosculpt, balloon angioplasty of the mid segment of the circumflex coronary artery with a 2.0 x 15 mm Euphora. The stenosis in the high OM1/ramus intermediate was reduced from 80-90% to 0% with maintenance of TIMI 3 flow. The 99% stenosis in the distal circumflex was reduced to 0% with TIMI-3 to TIMI-3 flow maintained at the end of the procedure in all the vessels. 125 mL of contrast was utilized for interventional procedure.  Indication:  patient is a 33 year-old African-American male with history of HIV disease, history of chronic renal insufficiency, right ureteric calculi leading to right kidney hydronephrosis, history of lymphoma presently on chemotherapy, bilateral leg weakness, enhanced lesion at the spine, felt to be sepsis versus metastatic disease/lymphoma, was admitted to the hospital with cardiac arrest with positive cardiac markers for non-ST elevation myocardial infarction. He underwent coronary angiography yesterday which had revealed high-grade stenosis of a very large OM1 and high-grade stenosis of the distal circumflex carotid artery which was diffusely diseased. Due to his history of lymphoma, HIV disease, I had obtained consultation from oncology especially in view of recent thrombus cytopenia and need for continued therapy for lymphoma, at that point they felt that I should proceed with intervention and they were agreeable that patient should need one antiplatelet therapy for at least one year. Hence he is now brought for elective angioplasty of the same.  Interventional data: Successful PTCA and Stenting  of the OM1 and proximal Circumflex with a 3.0 x 16 mm Promus Premier DES, scoring balloon angioplasty of the mid and distal circumflex quality  artery with a 2.5 x 10 mm Angiosculpt, balloon angioplasty of the mid segment of the circumflex coronary artery with a 2.0 x 15 mm Euphora. The stenosis in the high OM1/ramus intermediate was reduced from 80-90% to 0% with maintenance of TIMI 3 flow. The 99% stenosis in the distal circumflex was reduced to 0% with TIMI-3 to TIMI-3 flow    Will need Dual antiplatelet therapy with Brilinta and ASA 81 mg for at least 1 year.   Technique of intervention:  Using a 6 [place] XB 3.5  guide catheter theleft main  coronary  was selected and cannulated. Using Angiomax for anticoagulation, I utilized a Sports coach and across the circumflex  coronary artery without any difficulty. I placed the tip of the wire into the distal  coronary artery. Angiography was performed.   Then I utilized a 2.5 x 10 mm Angiosculpt balloon , I performed balloon angioplasty at 6 atmospheric  pressure x 2  for 90  seconds each in the distal and also mid to distal circumflex quality artery. Excellent result was evident with 0% residual stenosis. Mild diffuse disease in the midsegment was left alone and the wire was withdrawn and placed into the high OM1. The same balloon was utilized and multiple cuts were made in the high OM1 at 12 atmospheric pressure for 60 seconds, this was followed by implantation of a 3.0x16  mm Promus Premier  drug-eluting stent into the high OM1, jetting into the proximal circumflex  coronary artery. The stent was deployed at 12  atmospheric pressure for 50  seconds. The stent was then post dilated with a 3.25 x 12  mm [place] Emerge balloon at 14 atmospheric pressure for  30 seconds. Post-balloon angioplasty results were excellent with 0% residual stenoses and TIMI-3 flow was maintained. There was no evidence of edge dissection.   I then attempted to balloon angioplasty to mid segment of the circumflex coronary artery as it appeared hazy, this site was not previously angioplastied. Advanced by the guidewire and  significant spasm was evident, intracoronary nitroglycerin was administered. I then used a 2.0 x 15 mm Euphora balloon and balloon angioplasty at 12 atmospheric pressure for 90 seconds, repeat angiography revealed complete revascularization, without any haziness, and the balloon was gently withdrawn into the circumflex proximal to the OM1 and placing the balloon just into the struts of the previously placed stent, that is the OM stent that was jetting into the circumflex, a balloon inflation at 8 atmospheric pressure was performed just in case there was restenosis and I had to recross the stent struts. Excellent result was evident. The guidewire was withdrawn out of the body and the guide catheter was engaged and pulled out of the body over the J-wire the was no immediate complication. Patient tolerated the procedure well. Hemostasis was obtained by applying TR band.

## 2014-05-19 NOTE — Progress Notes (Signed)
TR BAND REMOVAL  LOCATION:    right radial  DEFLATED PER PROTOCOL:    Yes.    TIME BAND OFF / DRESSING APPLIED:    1745   SITE UPON ARRIVAL:    Level 0  SITE AFTER BAND REMOVAL:    Level 0  REVERSE ALLEN'S TEST:     positive  CIRCULATION SENSATION AND MOVEMENT:    Within Normal Limits   Yes.    COMMENTS:   Tolerated procedure well

## 2014-05-19 NOTE — Consult Note (Signed)
Reason for Consult:CKD3, dye exposure Referring Physician: Dr. Sela Hilding Thomas [name] is an 33 y.o. male.  HPI: 33 yr male with hx of HIV since 2013, on HAART, hx penile cyst, hs lymphoma sinc 1.15 tx with 4 cycles of ABVD  (BMT???).   Hx anemia, infected port-a-cath. 02/2014 had R Nx for renal stones and nonfunctional kidney.  Suffered cardiac arrest with prolonged resuscitation on 05/15/14.  Initially on vent, pressors, both weaned. Now has cath on 10/26 and 2nd on 10/27.   Still ^ LFTs, and mild loss of memory.  Cr has varied this hosp 2-2.2.  This is c/w his post nephrectomy status.           Mother had Nx in past also but no FH of renal disease, no FH of inherited hearing, ms skel, or eye defects.  No hx UTIs and no voiding sx. Constitutional: R leg pain only Eyes: negative Ears, nose, mouth, throat, and face: negative Respiratory: quit smoking 1/15, 15 pk yr Cardiovascular: see HPI and cards note Gastrointestinal: negative Genitourinary:negative Integument/breast: negative Hematologic/lymphatic: hx anemia, hx low WBC Musculoskeletal:negative Neurological: see HPI Allergic/Immunologic: negative   Past Medical History  Diagnosis Date  . HIV disease 06/25/2012  . Skin lesion 07/03/2012  . Leg pain 07/03/2012  . Sinus tachycardia 08/06/2012  . Penile cyst 08/06/2012  . History of blood transfusion july 2015 per pt  . 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 Chronic renal insufficiency   . Cardiac arrest 05/15/2014  . CKD (chronic kidney disease) s/p Right nephrectomy (04/30/2014) 05/19/2014    Past Surgical History  Procedure Laterality Date  . 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  this after several previous I and Ds of rectal abcess.   [name] node biopsy Right 08/08/2013    Procedure: SUPRACLAVICULAR LYMPH NODE BIOPSY;  Surgeon: Gaye Pollack, MD;  Location: [facility];  Service: Thoracic;   Laterality: Right;  . 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 Tee without cardioversion N/A 02/27/2014    Procedure: TRANSESOPHAGEAL ECHOCARDIOGRAM (TEE);  Surgeon: Pixie Casino, MD;  Location: [facility];  Service: Cardiovascular;  Laterality: N/A;  . Pac placed and later removed    . Right nephrostomy tube  for last month and 1/2  . Laparoscopic nephrectomy Right 04/30/2014    Procedure: LAPAROSCOPIC NEPHRECTOMY AND URETERECTOMY;  Surgeon: Raynelle Bring, MD;  Location: [facility];  Service: Urology;  Laterality: Right;  . Cardiac catheterization  05/19/2014  . Coronary stent placement  05/19/2014    OMI   & PROXIMAL CIRCUMFLEX    History reviewed. No pertinent family history.  Social History:  reports that he quit smoking about 7 months ago. His smoking use included Cigarettes. He started smoking about 8 months ago. He has a 15 pack-year smoking history. He has never used smokeless tobacco. He reports that he does not drink alcohol or use illicit drugs.  Allergies:  Allergies  Allergen Reactions  . Tylenol [Acetaminophen]     sweats    Medications:  I have reviewed the patient's current medications. Prior to Admission:  Prescriptions prior to admission  Medication Sig Dispense Refill  . Abacavir-Dolutegravir-Lamivud 600-50-300 MG TABS Take 1 tablet by mouth at bedtime.      Marland Kitchen atenolol (TENORMIN) 25 MG tablet Take 25 mg by mouth daily.      . calcium carbonate (TUMS - DOSED IN MG ELEMENTAL CALCIUM) 500  MG chewable tablet Chew 4 tablets by mouth 3 (three) times daily.      Marland Kitchen docusate sodium (COLACE) 100 MG capsule Take 1 capsule (100 mg total) by mouth 2 (two) times daily.  30 capsule  0  . feeding supplement, ENSURE COMPLETE, (ENSURE COMPLETE) LIQD Take 237 mLs by mouth 3 (three) times daily between meals.      . ferrous sulfate 325 (65 FE) MG tablet Take 325 mg by mouth 2 (two) times daily with a meal.      . oxyCODONE (ROXICODONE) 5 MG immediate release tablet Take 1 tablet (5 mg total)  by mouth every 4 (four) hours as needed for severe pain.  30 tablet  0  . prochlorperazine (COMPAZINE) 10 MG tablet Take 10 mg by mouth every 6 (six) hours as needed for nausea or vomiting.        Results for orders placed during the hospital encounter of 05/15/14 (from the past 48 hour(s))  BASIC METABOLIC PANEL     Status: Abnormal   Collection Time    05/18/14  5:00 AM      Result Value Ref Range   Sodium 140  137 - 147 mEq/L   Potassium 3.2 (*) 3.7 - 5.3 mEq/L   Chloride 110  96 - 112 mEq/L   CO2 20  19 - 32 mEq/L   Glucose, Bld 88  70 - 99 mg/dL   BUN 12  6 - 23 mg/dL   Creatinine, Ser 2.20 (*) 0.50 - 1.35 mg/dL   Calcium 8.0 (*) 8.4 - 10.5 mg/dL   GFR calc non Af Amer 38 (*) >90 mL/min   GFR calc Af Amer 44 (*) >90 mL/min   Comment: (NOTE)     The eGFR has been calculated using the CKD EPI equation.     This calculation has not been validated in all clinical situations.     eGFR's persistently <90 mL/min signify possible Chronic Kidney     Disease.   Anion gap 10  5 - 15  CBC     Status: Abnormal   Collection Time    05/18/14  5:00 AM      Result Value Ref Range   WBC 3.5 (*) 4.0 - 10.5 K/uL   RBC 2.80 (*) 4.22 - 5.81 MIL/uL   Hemoglobin 9.2 (*) 13.0 - 17.0 g/dL   HCT 26.9 (*) 39.0 - 52.0 %   MCV 96.1  78.0 - 100.0 fL   MCH 32.9  26.0 - 34.0 pg   MCHC 34.2  30.0 - 36.0 g/dL   RDW 13.3  11.5 - 15.5 %   Platelets 92 (*) 150 - 400 K/uL   Comment: CONSISTENT WITH PREVIOUS RESULT  CBC     Status: Abnormal   Collection Time    05/18/14 12:00 PM      Result Value Ref Range   WBC 3.7 (*) 4.0 - 10.5 K/uL   RBC 2.75 (*) 4.22 - 5.81 MIL/uL   Hemoglobin 9.2 (*) 13.0 - 17.0 g/dL   HCT 26.6 (*) 39.0 - 52.0 %   MCV 96.7  78.0 - 100.0 fL   MCH 33.5  26.0 - 34.0 pg   MCHC 34.6  30.0 - 36.0 g/dL   RDW 13.2  11.5 - 15.5 %   Platelets 96 (*) 150 - 400 K/uL   Comment: CONSISTENT WITH PREVIOUS RESULT  CREATININE, SERUM     Status: Abnormal   Collection Time    05/18/14 12:00 PM  Result Value Ref Range   Creatinine, Ser 2.19 (*) 0.50 - 1.35 mg/dL   GFR calc non Af Amer 38 (*) >90 mL/min   GFR calc Af Amer 44 (*) >90 mL/min   Comment: (NOTE)     The eGFR has been calculated using the CKD EPI equation.     This calculation has not been validated in all clinical situations.     eGFR's persistently <90 mL/min signify possible Chronic Kidney     Disease.  LIPID PANEL     Status: Abnormal   Collection Time    05/18/14 12:00 PM      Result Value Ref Range   Cholesterol 129  0 - 200 mg/dL   Triglycerides 95  <150 mg/dL   HDL 25 (*) >39 mg/dL   Total CHOL/HDL Ratio 5.2     VLDL 19  0 - 40 mg/dL   LDL Cholesterol 85  0 - 99 mg/dL   Comment:            Total Cholesterol/HDL:CHD Risk     Coronary Heart Disease Risk Table                         Men   Women      1/2 Average Risk   3.4   3.3      Average Risk       5.0   4.4      2 X Average Risk   9.6   7.1      3 X Average Risk  23.4   11.0                Use the calculated Patient Ratio     above and the CHD Risk Table     to determine the patient's CHD Risk.                ATP III CLASSIFICATION (LDL):      <100     mg/dL   Optimal      100-129  mg/dL   Near or Above                        Optimal      130-159  mg/dL   Borderline      160-189  mg/dL   High      >190     mg/dL   Very High  COMPREHENSIVE METABOLIC PANEL     Status: Abnormal   Collection Time    05/19/14 12:18 AM      Result Value Ref Range   Sodium 141  137 - 147 mEq/L   Potassium 3.9  3.7 - 5.3 mEq/L   Chloride 110  96 - 112 mEq/L   CO2 22  19 - 32 mEq/L   Glucose, Bld 90  70 - 99 mg/dL   BUN 10  6 - 23 mg/dL   Creatinine, Ser 2.07 (*) 0.50 - 1.35 mg/dL   Calcium 8.3 (*) 8.4 - 10.5 mg/dL   Total Protein 6.1  6.0 - 8.3 g/dL   Albumin 2.3 (*) 3.5 - 5.2 g/dL   AST 60 (*) 0 - 37 U/L   ALT 22  0 - 53 U/L   Alkaline Phosphatase 122 (*) 39 - 117 U/L   Total Bilirubin 0.4  0.3 - 1.2 mg/dL   GFR calc non Af Amer 41 (*) >90 mL/min    GFR calc Af  Amer 48 (*) >90 mL/min   Comment: (NOTE)     The eGFR has been calculated using the CKD EPI equation.     This calculation has not been validated in all clinical situations.     eGFR's persistently <90 mL/min signify possible Chronic Kidney     Disease.   Anion gap 9  5 - 15  CBC     Status: Abnormal   Collection Time    05/19/14 12:18 AM      Result Value Ref Range   WBC 3.3 (*) 4.0 - 10.5 K/uL   RBC 3.15 (*) 4.22 - 5.81 MIL/uL   Hemoglobin 10.5 (*) 13.0 - 17.0 g/dL   HCT 30.7 (*) 39.0 - 52.0 %   MCV 97.5  78.0 - 100.0 fL   MCH 33.3  26.0 - 34.0 pg   MCHC 34.2  30.0 - 36.0 g/dL   RDW 13.1  11.5 - 15.5 %   Platelets 105 (*) 150 - 400 K/uL   Comment: CONSISTENT WITH PREVIOUS RESULT  BASIC METABOLIC PANEL     Status: Abnormal   Collection Time    05/19/14  4:32 AM      Result Value Ref Range   Sodium 140  137 - 147 mEq/L   Potassium 3.6 (*) 3.7 - 5.3 mEq/L   Chloride 108  96 - 112 mEq/L   CO2 21  19 - 32 mEq/L   Glucose, Bld 90  70 - 99 mg/dL   BUN 9  6 - 23 mg/dL   Creatinine, Ser 2.11 (*) 0.50 - 1.35 mg/dL   Calcium 8.6  8.4 - 10.5 mg/dL   GFR calc non Af Amer 40 (*) >90 mL/min   GFR calc Af Amer 46 (*) >90 mL/min   Comment: (NOTE)     The eGFR has been calculated using the CKD EPI equation.     This calculation has not been validated in all clinical situations.     eGFR's persistently <90 mL/min signify possible Chronic Kidney     Disease.   Anion gap 11  5 - 15  PHOSPHORUS     Status: None   Collection Time    05/19/14  4:32 AM      Result Value Ref Range   Phosphorus 2.3  2.3 - 4.6 mg/dL  MAGNESIUM     Status: None   Collection Time    05/19/14  4:32 AM      Result Value Ref Range   Magnesium 1.5  1.5 - 2.5 mg/dL  CBC     Status: Abnormal   Collection Time    05/19/14  4:32 AM      Result Value Ref Range   WBC 3.0 (*) 4.0 - 10.5 K/uL   RBC 2.98 (*) 4.22 - 5.81 MIL/uL   Hemoglobin 9.8 (*) 13.0 - 17.0 g/dL   HCT 29.0 (*) 39.0 - 52.0 %   MCV 97.3   78.0 - 100.0 fL   MCH 32.9  26.0 - 34.0 pg   MCHC 33.8  30.0 - 36.0 g/dL   RDW 13.1  11.5 - 15.5 %   Platelets 113 (*) 150 - 400 K/uL   Comment: CONSISTENT WITH PREVIOUS RESULT  LACTATE DEHYDROGENASE     Status: Abnormal   Collection Time    05/19/14  4:32 AM      Result Value Ref Range   LDH 350 (*) 94 - 250 U/L  CBC     Status: Abnormal  Collection Time    05/19/14  5:15 PM      Result Value Ref Range   WBC 2.7 (*) 4.0 - 10.5 K/uL   RBC 3.39 (*) 4.22 - 5.81 MIL/uL   Hemoglobin 11.4 (*) 13.0 - 17.0 g/dL   HCT 33.2 (*) 39.0 - 52.0 %   MCV 97.9  78.0 - 100.0 fL   MCH 33.6  26.0 - 34.0 pg   MCHC 34.3  30.0 - 36.0 g/dL   RDW 13.0  11.5 - 15.5 %   Platelets 120 (*) 150 - 400 K/uL    No results found.  ROS Blood pressure 106/70, pulse 87, temperature 98 F (36.7 C), temperature source Oral, resp. rate 18, height 5' 7.32" (1.71 m), weight 68.584 kg (151 lb 3.2 oz), SpO2 100.00%. Physical Exam Physical Examination: General appearance - alert, well appearing, and in no distress and poor recent memory Mental status - affect inappropriate recent memory issues Eyes - pupils equal and reactive, extraocular eye movements intact, funduscopic exam normal, discs flat and sharp Mouth - mucous membranes moist, pharynx normal without lesions Neck - adenopathy noted PCL Lymphatics - posterior cervical nodes Chest - clear to auscultation, no wheezes, rales or rhonchi, symmetric air entry Heart - S1 and S2 normal, systolic murmur CK2/2 at apex Abdomen - soft, nontender, nondistended, no masses or organomegaly Liver down 4 cm Back exam - full range of motion, no tenderness, palpable spasm or pain on motion Neurological - see above, no focal motor or CN deficits Musculoskeletal - no joint tenderness, deformity or swelling Extremities - R leg diffusely tender , swollen and warm Skin - leg as above  Assessment/Plan: 1 CKD3  Less function than expect with 1 kidney.  Has some mild AKI but not  a lot.  Need to check for stones in remaining kidney, look at urine. Abacavir can be toxic also and see if any evidence.  Need to eval if has proteinuria as last UA..  High risk for toxicity from 2 d exposure to dye but that risk overall is <40% 2 HIV eval to see if can stay on abacavir 3 CAD s/p caths and arrest per cards 4. Anemia  5. Complic of CKD3 B eval 6 Lymphoma per Onc P UA, uric acid, PTH, U/S. Follow Cr, Fe studies  Tycho Cheramie L 05/19/2014, 5:52 PM

## 2014-05-19 NOTE — Progress Notes (Signed)
Patient Thomas [name]      DOB: 04-07-1981      WYO:[id number]  Noted Consult canceled . Please reconsult if services desired.  Chaney Ingram L. Lovena Le, MD MBA The Palliative Medicine Team at [facility] Phone: [phone] Pager: [phone] ( Use team phone after hours)

## 2014-05-19 NOTE — Progress Notes (Signed)
CSW (Clinical Education officer, museum) received consult to assist with longer home care hours. CSW spoke with pt who confirmed services are currently being provided through [facility] program. Pt is aware that CSW cannot assist with getting caregiver hours extended and that he will need to speak with Medicaid caseworker. Pt confirmed he is aware of the process and will be able to contact his caseworker. CSW did also notify RNCM of potential need for home health services. At this time, pt has no further hospital social work needs. Please reconsult if new needs arise.  [place], [place]

## 2014-05-19 NOTE — Progress Notes (Signed)
[facility] PROGRESS NOTE  Thomas [name] EVO:[id number] DOB: Jul 18, 1981 DOA: 05/15/2014 PCP: Philis Fendt, MD  Brief narrative Patient is a 33 year old gentleman with history of Hodgkin's disease, HIV disease, chronic kidney disease status post right nephrectomy for nonfunctioning right kidney in October 2015, who was noted to have seizure-like activity at home. On arrival by EMS patient was in V. tach /V. fib and pulseless electrical activity, requiring defibrillation and pressors. Patient was admitted to the critical care team. Patient subsequently underwent emergent cardiac catheterization and found to have high-grade circumflex stenosis requiring stent placement. Patient subsequently underwent PTCA and stenting of the OM1 and proximal circumflex with DES on 05/19/2014. Patient was subsequently transferred to the hospitalist service.   Assessment/Plan: #1 cardiac arrest--V. fib/V. tach/non-STEMI/PEA Patient status post the defibrillation and pressors, was in the ICU under the critical care team as well as vent dependent. Patient subsequently off pressors. Patient has been extubated. Patient underwent emergent cardiac catheterization found to have high-grade circumflex stenosis requiring stent placement which was done today. Patient currently chest pain-free. Continue beta blocker. Continue statin. Patient has been started on Brilinta per cardiology. Cardiology following.  #2 chronic kidney disease stage III status post nephrectomy Patient has recently undergone 2 days of diet spoke secondary to cardiac catheterization and is status post stent placement. Will consult with nephrology for further evaluation and management.  #3 HIV Will consult with ID for further evaluation and management of patient's medications. Concern for toxicity to the kidneys. Follow.  #4 Hodgkin's disease Patient being followed by oncology. Per oncology.  #5 thrombocytopenia Secondary to acute illness.  Platelet function improving. Per oncology. Follow.  #6 anemia/neutropenia Likely secondary to Hodgkin's disease and recent cardiac issues. Hemoglobin currently stable at 9.8. No need for transfusion at this time. Follow.  #7 right lower extremity pain Will check per films of the right lower extremity as well as ultrasound of the right lower extremity. Follow. Pain management.  #8 Klebsiella UTI On IV Rocephin. Will change to oral Ceftin.  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 Patient with some short-term memory deficits. Continue supportive care. PT/OT.  #10 prophylaxis Lovenox for DVT prophylaxis.    Code Status: Full Family Communication: Updated patient and mother at bedside. Disposition Plan: Home versus SNF when medically stable.   Consultants:  Cardiology 05/16/2014 per Dr. Einar Gip  Hematology oncology: Dr. Lona Kettle 05/18/2014  Procedures:  Cardiac catheterization 05/18/2014 per Dr. Nadyne Coombes  Cardiac catheterization with PTCA and stenting of the OM1 and proximal circumflex with DES per Dr. Einar Gip 05/19/2014  CT head 05/15/2014  Chest x-ray 05/16/2014, 05/15/2014  2-D echo 05/16/2014  Vent dependent restoration failure secondary to cardiac arrest  Antibiotics:  IV Rocephin 05/17/2014  HPI/Subjective: Patient denies chest pain. Patient post cath. Patient states does not remember anything about his cardiac arrest.  Objective: Filed Vitals:   05/19/14 1508  BP: 120/87  Pulse:   Temp: 97.4 F (36.3 C)  Resp: 20    Intake/Output Summary (Last 24 hours) at 05/19/14 1633 Last data filed at 05/19/14 1059  Gross per 24 hour  Intake    600 ml  Output   2375 ml  Net  -1775 ml   Filed Weights   05/15/14 0745 05/18/14 0711 05/19/14 0543  Weight: 69.2 kg (152 lb 8.9 oz) 71.9 kg (158 lb 8.2 oz) 68.584 kg (151 lb 3.2 oz)    Exam:   General:  NAD. Burn marks on chest  Cardiovascular: RRR no murmurs rubs or gallops.  Respiratory: Clear  to auscultation bilaterally anterior lung fields.  Abdomen: Soft, nontender, nondistended, positive bowel sounds.  Musculoskeletal: No clinical cyanosis. Right lower extremity exquisitely tender to palpation with some warmth.  Data Reviewed: Basic Metabolic Panel:  Recent Labs Lab 05/15/14 0558  05/16/14 0500 05/17/14 0500 05/18/14 0500 05/18/14 1200 05/19/14 0018 05/19/14 0432  NA 143  < > 140 141 140  --  141 140  K 4.1  < > 3.9 3.5* 3.2*  --  3.9 3.6*  CL 104  < > 107 108 110  --  110 108  CO2 11*  --  21 20 20   --  22 21  GLUCOSE 367*  < > 112* 105* 88  --  90 90  BUN 22  < > 26* 18 12  --  10 9  CREATININE 2.14*  < > 2.34* 2.22* 2.20* 2.19* 2.07* 2.11*  CALCIUM 8.3*  --  8.5 8.0* 8.0*  --  8.3* 8.6  MG 2.2  --   --   --   --   --   --  1.5  PHOS 9.2*  --   --   --   --   --   --  2.3  < > = values in this interval not displayed. Liver Function Tests:  Recent Labs Lab 05/15/14 0558 05/16/14 0500 05/17/14 0500 05/19/14 0018  AST 96* 112* 101* 60*  ALT 73* 57* 37 22  ALKPHOS 173* 149* 119* 122*  BILITOT <0.2* 0.3 0.4 0.4  PROT 6.2 6.5 5.8* 6.1  ALBUMIN 2.4* 2.7* 2.2* 2.3*   No results found for this basename: LIPASE, AMYLASE,  in the last 168 hours No results found for this basename: AMMONIA,  in the last 168 hours CBC:  Recent Labs Lab 05/15/14 0558  05/17/14 0500 05/18/14 0500 05/18/14 1200 05/19/14 0018 05/19/14 0432  WBC 18.0*  < > 4.0 3.5* 3.7* 3.3* 3.0*  NEUTROABS 7.9*  --   --   --   --   --   --   HGB 12.5*  < > 9.0* 9.2* 9.2* 10.5* 9.8*  HCT 38.1*  < > 26.3* 26.9* 26.6* 30.7* 29.0*  MCV 104.4*  < > 95.6 96.1 96.7 97.5 97.3  PLT 206  < > 83* 92* 96* 105* 113*  < > = values in this interval not displayed. Cardiac Enzymes:  Recent Labs Lab 05/15/14 1800 05/15/14 2359 05/16/14 1249 05/16/14 1602 05/16/14 2258  TROPONINI >20.00* >20.00* >20.00* >20.00* 17.50*   BNP (last 3 results) No results found for this basename: PROBNP,  in the  last 8760 hours CBG:  Recent Labs Lab 05/15/14 0525 05/15/14 0550 05/15/14 0825  GLUCAP 83 151* 292*    Recent Results (from the past 240 hour(s))  CULTURE, BLOOD (ROUTINE X 2)     Status: None   Collection Time    05/15/14  5:58 AM      Result Value Ref Range Status   Specimen Description BLOOD LEFT HAND   Final   Special Requests BOTTLES DRAWN AEROBIC AND ANAEROBIC 5CC EACH   Final   Culture  Setup Time     Final   Value: 05/15/2014 08:58     Performed at [facility]   Culture     Final   Value:        BLOOD CULTURE RECEIVED NO GROWTH TO DATE CULTURE WILL BE HELD FOR 5 DAYS BEFORE ISSUING A FINAL NEGATIVE REPORT     Performed at  [facility]   Report Status PENDING   Incomplete  CULTURE, BLOOD (ROUTINE X 2)     Status: None   Collection Time    05/15/14  6:06 AM      Result Value Ref Range Status   Specimen Description BLOOD LEFT ARM   Final   Special Requests BOTTLES DRAWN AEROBIC AND ANAEROBIC 4CC EACH   Final   Culture  Setup Time     Final   Value: 05/15/2014 08:58     Performed at [facility]   Culture     Final   Value:        BLOOD CULTURE RECEIVED NO GROWTH TO DATE CULTURE WILL BE HELD FOR 5 DAYS BEFORE ISSUING A FINAL NEGATIVE REPORT     Performed at [facility]   Report Status PENDING   Incomplete  MRSA PCR SCREENING     Status: None   Collection Time    05/15/14  7:53 AM      Result Value Ref Range Status   MRSA by PCR NEGATIVE  NEGATIVE Final   Comment:            The GeneXpert MRSA Assay (FDA     approved for NASAL specimens     only), is one component of a     comprehensive MRSA colonization     surveillance program. It is not     intended to diagnose MRSA     infection nor to guide or     monitor treatment for     MRSA infections.  URINE CULTURE     Status: None   Collection Time    05/15/14  7:57 AM      Result Value Ref Range Status   Specimen Description URINE, CLEAN CATCH   Final   Special Requests NONE    Final   Culture  Setup Time     Final   Value: 05/15/2014 08:15     Performed at [place]     Final   Value: >=100,000 COLONIES/ML     Performed at [facility]   Culture     Final   Value: KLEBSIELLA PNEUMONIAE     Performed at [facility]   Report Status 05/17/2014 FINAL   Final   Organism ID, Bacteria KLEBSIELLA PNEUMONIAE   Final  CLOSTRIDIUM DIFFICILE BY PCR     Status: None   Collection Time    05/15/14  4:26 PM      Result Value Ref Range Status   C difficile by pcr NEGATIVE  NEGATIVE Final     Studies: No results found.  Scheduled Meds: . Abacavir-Dolutegravir-Lamivud  1 tablet Per Tube Daily  . atorvastatin  80 mg Oral q1800  . cefTRIAXone (ROCEPHIN)  IV  1 g Intravenous Q24H  . enoxaparin (LOVENOX) injection  40 mg Subcutaneous Q24H  . feeding supplement (ENSURE COMPLETE)  237 mL Oral TID BM  . ferrous sulfate  325 mg Oral BID WC  . metoprolol tartrate  25 mg Oral BID  . ticagrelor  90 mg Oral BID   Continuous Infusions: . sodium chloride      Principal Problem:   Cardiac arrest Active Problems:   NSTEMI (non-ST elevated myocardial infarction)   HIV disease   Leg pain   Anemia   Protein-calorie malnutrition, severe   Hodgkin's disease   Hypokalemia   Thrombocytopenia   CKD (chronic kidney disease) s/p Right nephrectomy (  04/30/2014)   UTI (lower urinary tract infection): Klebsiella   Transaminitis   Acute respiratory failure with hypoxemia: post cardiac arrest    Time spent: 40 minutes     [facility] MD [facility] Pager [phone]. If 7PM-7AM, please contact night-coverage at [email or web address], password [facility] 05/19/2014, 4:33 PM  LOS: 4 days

## 2014-05-19 NOTE — Progress Notes (Signed)
Pt slowly progressing. Burns on chest are open to air, with no complaints. Pt has gone for PCI, mother at bedside. Tele box was removed. Etta Quill, RN

## 2014-05-19 NOTE — Evaluation (Signed)
Physical Therapy Evaluation Patient Details Name: Thomas [name] MRN: [id number] DOB: 04/23/1981 Today's Date: 05/19/2014   History of Present Illness    33 yo male former smoker noted to have seizure like activity at home. On arrival by EMS pt was in VT/VF/PEA. Approximately 1 hour for ROSC. 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 PMH also significant for HIV and Stage IIIA Hodgkin's disease.      Clinical Impression  Pt admitted with above. Pt currently with functional limitations due to the deficits listed below (see PT Problem List).  Pt will benefit from skilled PT to increase their independence and safety with mobility to allow discharge to the venue listed below. HHPT recommended to return pt to baseline.     Follow Up Recommendations Home health PT;Supervision/Assistance - 24 hour    Equipment Recommendations  None recommended by PT    Recommendations for Other Services       Precautions / Restrictions Precautions Precautions: Fall Restrictions Weight Bearing Restrictions: No      Mobility  Bed Mobility Overal bed mobility: Independent                Transfers Overall transfer level: Needs assistance Equipment used: 1 person hand held assist Transfers: Sit to/from Stand Sit to Stand: Min guard         General transfer comment: Pt needed cues for hand placement.  Pt needs guard assist for sit to stand.     Ambulation/Gait Ambulation/Gait assistance: Min guard Ambulation Distance (Feet): 100 Feet Assistive device: 1 person hand held assist Gait Pattern/deviations: Step-through pattern;Decreased stride length   Gait velocity interpretation: Below normal speed for age/gender General Gait Details: Overall pt doing well.  Pt pushed IV pole for some ambulation and was able to ambulate without pushing IV pole as well.  Good balance overall.  Pt without significant LOB.  Should do well with cane as he used PTA.    Stairs             Wheelchair Mobility    Modified Rankin (Stroke Patients Only)       Balance Overall balance assessment: Needs assistance;History of Falls Sitting-balance support: Single extremity supported;Feet supported       Standing balance support: Single extremity supported;During functional activity Standing balance-Leahy Scale: Fair Standing balance comment: can stand statically without UE support.                               Pertinent Vitals/Pain Right LE pain anterior LE not rated.   NR 74-109 bpm.      Home Living Family/patient expects to be discharged to:: Private residence Living Arrangements: Parent Available Help at Discharge: Available PRN/intermittently;Family (mom works, Catering manager and cooks M-F) Type of Home: Apartment Home Access: Level entry     Home Layout: One level [place]: Kasandra Knudsen - single point Additional Comments: Lives in an apartment, no stairs    Prior Function Level of Independence: Independent with assistive device(s)         Comments: Pt used cane      Hand Dominance   Dominant Hand: Right    Extremity/Trunk Assessment   Upper Extremity Assessment: Defer to OT evaluation           Lower Extremity Assessment: RLE deficits/detail due to right LE pain.         Communication   Communication: No difficulties  Cognition  Arousal/Alertness: Awake/alert Behavior During Therapy: WFL for tasks assessed/performed Overall Cognitive Status: Within Functional Limits for tasks assessed                      General Comments      Exercises General Exercises - Lower Extremity Long Arc Quad: AROM;Both;10 reps;Seated      Assessment/Plan    PT Assessment Patient needs continued PT services  PT Diagnosis Generalized weakness;Acute pain   PT Problem List Decreased balance;Decreased mobility;Decreased activity tolerance;Decreased knowledge of use of DME;Decreased safety awareness;Decreased  knowledge of precautions;Pain  PT Treatment Interventions DME instruction;Gait training;Functional mobility training;Therapeutic activities;Therapeutic exercise;Balance training;Patient/family education   PT Goals (Current goals can be found in the Care Plan section) Acute Rehab PT Goals Patient Stated Goal: to go home PT Goal Formulation: With patient Time For Goal Achievement: 06/02/14 Potential to Achieve Goals: Good    Frequency Min 3X/week   Barriers to discharge        Co-evaluation               End of Session Equipment Utilized During Treatment: Gait belt Activity Tolerance: Patient limited by fatigue;Patient limited by pain Patient left: in chair;with call bell/phone within reach Nurse Communication: Mobility status         Time: 9432-7614 PT Time Calculation (min): 29 min   Charges:   PT Evaluation $Initial PT Evaluation Tier I: 1 Procedure PT Treatments $Gait Training: 8-22 mins   PT G CodesDenice Paradise 06-02-2014, 1:02 PM Solymar Grace,PT Acute Rehabilitation [phone] (pager)

## 2014-05-20 DIAGNOSIS — E43 Unspecified severe protein-calorie malnutrition: Secondary | ICD-10-CM

## 2014-05-20 DIAGNOSIS — M79609 Pain in unspecified limb: Secondary | ICD-10-CM

## 2014-05-20 DIAGNOSIS — N183 Chronic kidney disease, stage 3 (moderate): Secondary | ICD-10-CM

## 2014-05-20 LAB — HEPARIN INDUCED THROMBOCYTOPENIA PNL
Heparin Induced Plt Ab: NEGATIVE
PATIENT O. D.: 0.06
UFH High Dose UFH H: 0 % Release
UFH LOW DOSE 0.5 IU/ML: 0 %
UFH Low Dose 0.1 IU/mL: 0 % Release
UFH SRA RESULT: NEGATIVE

## 2014-05-20 LAB — HEPATITIS B SURFACE ANTIGEN: Hepatitis B Surface Ag: NEGATIVE

## 2014-05-20 LAB — CBC
HEMATOCRIT: 30.4 % — AB (ref 39.0–52.0)
Hemoglobin: 10.3 g/dL — ABNORMAL LOW (ref 13.0–17.0)
MCH: 32.9 pg (ref 26.0–34.0)
MCHC: 33.9 g/dL (ref 30.0–36.0)
MCV: 97.1 fL (ref 78.0–100.0)
Platelets: 122 10*3/uL — ABNORMAL LOW (ref 150–400)
RBC: 3.13 MIL/uL — ABNORMAL LOW (ref 4.22–5.81)
RDW: 12.9 % (ref 11.5–15.5)
WBC: 2.8 10*3/uL — ABNORMAL LOW (ref 4.0–10.5)

## 2014-05-20 LAB — IRON AND TIBC
Iron: 33 ug/dL — ABNORMAL LOW (ref 42–135)
Saturation Ratios: 20 % (ref 20–55)
TIBC: 168 ug/dL — ABNORMAL LOW (ref 215–435)
UIBC: 135 ug/dL (ref 125–400)

## 2014-05-20 LAB — COMPREHENSIVE METABOLIC PANEL
ALT: 17 U/L (ref 0–53)
AST: 33 U/L (ref 0–37)
Albumin: 2.3 g/dL — ABNORMAL LOW (ref 3.5–5.2)
Alkaline Phosphatase: 108 U/L (ref 39–117)
Anion gap: 11 (ref 5–15)
BUN: 8 mg/dL (ref 6–23)
CALCIUM: 8.4 mg/dL (ref 8.4–10.5)
CO2: 20 meq/L (ref 19–32)
Chloride: 109 mEq/L (ref 96–112)
Creatinine, Ser: 1.93 mg/dL — ABNORMAL HIGH (ref 0.50–1.35)
GFR, EST AFRICAN AMERICAN: 52 mL/min — AB (ref >90)
GFR, EST NON AFRICAN AMERICAN: 45 mL/min — AB (ref >90)
GLUCOSE: 116 mg/dL — AB (ref 70–99)
Potassium: 3.9 mEq/L (ref 3.7–5.3)
Sodium: 140 mEq/L (ref 137–147)
Total Bilirubin: 0.4 mg/dL (ref 0.3–1.2)
Total Protein: 6.2 g/dL (ref 6.0–8.3)

## 2014-05-20 LAB — PARATHYROID HORMONE, INTACT (NO CA): PTH: 50 pg/mL (ref 14–64)

## 2014-05-20 LAB — PHOSPHORUS: Phosphorus: 2.1 mg/dL — ABNORMAL LOW (ref 2.3–4.6)

## 2014-05-20 LAB — HEPATITIS C ANTIBODY (REFLEX): HCV Ab: NEGATIVE

## 2014-05-20 MED ORDER — HEART ATTACK BOUNCING BOOK
Freq: Once | Status: DC
  Filled 2014-05-20: qty 1

## 2014-05-20 MED FILL — Sodium Chloride IV Soln 0.9%: INTRAVENOUS | Qty: 50 | Status: AC

## 2014-05-20 NOTE — Progress Notes (Signed)
CARDIAC REHAB PHASE I   PRE:  Rate/Rhythm: 89 SR  BP:  Supine: 87/49  Sitting: 85/69 90/56  Standing:    SaO2: 100 RA  MODE:  Ambulation: 300 ft   POST:  Rate/Rhythm: 137 ST  BP:  Supine:   Sitting: 107/84  Standing:    SaO2:  0800-0930 On arrival pt in bed c/o of being sleepy. Pt aroused easily. BP in bed 87/49, sat pt on side of bed BP 85/69. I checked BP with manuel cuff 90/56. Sitting pt denies any dizziness. I was getting pt ready to walk, he states that he  had a bowel movement in bed. I stepped out to get linen and on return to room pt had gotten out of bed and was in bathroom.Pt's HR 145 on monitor.  He had been incontinent of urine and stool in bed and all the way to the bathroom. NT and I clean pt up. Assisted X 1 and used walker to ambulate pt. He c/o of sore chest and of right leg discomfort with touch. He was able to walk 300 feet without c/o of cp or SOB. Pt to recliner after walk, HR after walking 137 ST as he rested his HR returned to 80's. Started MI and stent education with pt. I gave him MI booklet. We discussed Brilinta and ASA. Pt ask to get back in bed c/o that he did not sleep last night. He states that nothing was bothering him, just could not sleep.Dr Einar Gip in reported increased HR to him. Assisted pt back to bed per his request. We will continue to follow pt.  Rodney Langton RN 05/20/2014 9:26 AM

## 2014-05-20 NOTE — Progress Notes (Signed)
Subjective: Interval History: has no complaint, does not remember talking to me last pm.  Objective: Vital signs in last 24 hours: Temp:  [97.4 F (36.3 C)-98.6 F (37 C)] 98.1 F (36.7 C) (10/28 0808) Pulse Rate:  [45-95] 95 (10/28 0808) Resp:  [18-20] 18 (10/28 0808) BP: (85-136)/(64-96) 136/96 mmHg (10/28 0808) SpO2:  [28 %-100 %] 100 % (10/28 0808) Weight:  [68 kg (149 lb 14.6 oz)] 68 kg (149 lb 14.6 oz) (10/28 0000) Weight change: -3.9 kg (-8 lb 9.6 oz)  Intake/Output from previous day: 10/27 0701 - 10/28 0700 In: 2780 [P.O.:780; I.V.:2000] Out: 3400 [Urine:3400] Intake/Output this shift: Total I/O In: 240 [P.O.:240] Out: 300 [Urine:300]  General appearance: alert, cooperative and slowed mentation Resp: clear to auscultation bilaterally Cardio: S1, S2 normal and systolic murmur: holosystolic 2/6, blowing at apex GI: pos bs, soft,liver down 4 cm Extremities: R leg more swollen than L  Lab Results:  Recent Labs  05/19/14 1715 05/20/14 0330  WBC 2.7* 2.8*  HGB 11.4* 10.3*  HCT 33.2* 30.4*  PLT 120* 122*   BMET:  Recent Labs  05/19/14 0432 05/19/14 1715 05/20/14 0330  NA 140  --  140  K 3.6*  --  3.9  CL 108  --  109  CO2 21  --  20  GLUCOSE 90  --  116*  BUN 9  --  8  CREATININE 2.11* 1.97* 1.93*  CALCIUM 8.6  --  8.4   No results found for this basename: PTH,  in the last 72 hours Iron Studies:  Recent Labs  05/19/14 1945  IRON 33*  TIBC 168*    Studies/Results: Dg Tibia/fibula Right  05/20/2014   CLINICAL DATA:  Right lower leg pain, no known trauma.  EXAM: RIGHT TIBIA AND FIBULA - 2 VIEW  COMPARISON:  10/05/2010 ankle radiographs  FINDINGS: No displaced fracture. No aggressive osseous lesion. No radiopaque foreign body. These views are not optimized to evaluate the joint spaces.  IMPRESSION: No acute or aggressive osseous finding of the right tibia/fibula.   Electronically Signed   By: Carlos Levering M.D.   On: 05/20/2014 02:16   US  Renal  05/19/2014   CLINICAL DATA:  Acute renal insufficiency.  EXAM: RENAL/URINARY TRACT ULTRASOUND COMPLETE  COMPARISON:  Renal nuclear medicine scan 03/24/2014.  FINDINGS: Right Kidney:  Right Nephrectomy.  Left Kidney:  Length: 14.5 cm. Echogenicity within normal limits. No mass or significant hydronephrosis visualized. Mild caliectasis most likely normal for solitary kidney.  Bladder:  Appears normal for degree of bladder distention.  IMPRESSION: Right nephrectomy. Mild caliectasis left kidney, most likely normal for a solitary kidney. If symptoms remain a follow-up renal ultrasound can be obtained to exclude developing hydronephrosis on the left.   Electronically Signed   By: Marcello Moores  Register   On: 05/19/2014 20:48    I have reviewed the patient's current medications. Prior to Admission:  Prescriptions prior to admission  Medication Sig Dispense Refill  . Abacavir-Dolutegravir-Lamivud 600-50-300 MG TABS Take 1 tablet by mouth at bedtime.      Marland Kitchen atenolol (TENORMIN) 25 MG tablet Take 25 mg by mouth daily.      . calcium carbonate (TUMS - DOSED IN MG ELEMENTAL CALCIUM) 500 MG chewable tablet Chew 4 tablets by mouth 3 (three) times daily.      Marland Kitchen docusate sodium (COLACE) 100 MG capsule Take 1 capsule (100 mg total) by mouth 2 (two) times daily.  30 capsule  0  . feeding supplement, ENSURE COMPLETE, (  ENSURE COMPLETE) LIQD Take 237 mLs by mouth 3 (three) times daily between meals.      . ferrous sulfate 325 (65 FE) MG tablet Take 325 mg by mouth 2 (two) times daily with a meal.      . oxyCODONE (ROXICODONE) 5 MG immediate release tablet Take 1 tablet (5 mg total) by mouth every 4 (four) hours as needed for severe pain.  30 tablet  0  . prochlorperazine (COMPAZINE) 10 MG tablet Take 10 mg by mouth every 6 (six) hours as needed for nausea or vomiting.        Assessment/Plan: 1  CKD 3 as result of Nx, mild renal dysfunction.  Has hematuria, if persists, Dr. Alinda Money to see but had foley and recent  events.  Mild proteinuria will need to be followed and possibly addressed long term but not now.  Will be glad to see outpatient 2 S/P arrest 3 CAD s/p cath and stents 4 Lymphoma 5 HIV P would be glad to see outpatient.  willnot follow formally at this time    LOS: 5 days   [place] Fanfan L 05/20/2014,10:30 AM

## 2014-05-20 NOTE — Progress Notes (Signed)
Patient transferred from 6 Central to 2W27, patient alert and oriented, denies any pain/distress, oriented patient to room/unit and reviewed plan of care with patient. Will continue to F/U with plan of care.

## 2014-05-20 NOTE — Progress Notes (Signed)
PROGRESS NOTE  Thomas [name] ERD:[id number] DOB: 04/23/1981 DOA: 05/15/2014 PCP: Philis Fendt, MD  HPI/Recap of past 88 hours: 33 year old male with past medical history of Hodgkin's disease, HIV and chronic kidney disease status post nephrectomy last month admitted on 10/23 she reported seizure-like activity and patient was emergently resuscitated after found to be in ventricular arrhythmia plus pulseless electrical activity. Patient initially admitted to ICU. Underwent emergent cardiac catheterization and found to have high-grade circumflex stenoses requiring stent placement. Following this, he underwent PTCA and stenting of OM1 plus proximal circumflex with DES on 10/27 and then transferred to the hospitalist service.  Seen after transfer to stepdown. Patient feeling better. No complaints.  Assessment/Plan: Principal Problem:   Cardiac arrest in the setting of non-ST elevated MI: Status post stent placement on beta blocker plus statin plus spell BRI LI MTA. Cardiology following. Stabilized and will transfer to telemetry bed. Active Problems:   HIV disease: Being followed by infectious diseases outpatient. Will resume antiretrovirals.   Leg pain: No signs of fracture or bone displacement offered x-rays   Anemia: Secondary to chronic disease   Protein-calorie malnutrition, severe: Nutrition to see   Hodgkin's disease: Stable. Being followed by oncology as outpatient   Hypokalemia Pancytopenia: Likely secondary to Hodgkin's disease. No need for transfusion at this time.     CKD (chronic kidney disease) s/p Right nephrectomy (04/30/2014)   UTI (lower urinary tract infection): Klebsiella: On by mouth antibiotics   Transaminitis: Likely in the setting of hypotension, resolved   Acute respiratory failure with hypoxemia: post cardiac arrest: Stable   Code Status: Full code  Family Communication: No family present  Disposition Plan: Home once cleared by  cardiology   Consultants:  Cardiology  Nephrology  Procedures:  Status post cardiac catheterization done 10/26  Status post PTCA plus stent placement of OM1 plus proximal circumflex with DES done 10/27  Echocardiogram done 10/24: Normal echocardiogram with preserved ejection fraction  Antibiotics:  IV Rocephin 10/25-10/27  PO Ceftin 10/27-present   Objective: BP 98/68  Pulse 79  Temp(Src) 98.3 F (36.8 C) (Oral)  Resp 20  Ht 5' 7.32" (1.71 m)  Wt 68 kg (149 lb 14.6 oz)  BMI 23.26 kg/m2  SpO2 99%  Intake/Output Summary (Last 24 hours) at 05/20/14 1659 Last data filed at 05/20/14 1300  Gross per 24 hour  Intake   2900 ml  Output   2850 ml  Net     50 ml   Filed Weights   05/18/14 0711 05/19/14 0543 05/20/14 0000  Weight: 71.9 kg (158 lb 8.2 oz) 68.584 kg (151 lb 3.2 oz) 68 kg (149 lb 14.6 oz)    Exam:   General:  Alert and oriented 3, no acute distress  Cardiovascular: Regular rate and rhythm, S1-S2  Respiratory: Clear to auscultation bilaterally  Abdomen: Soft, nontender, nondistended, positive bowel sounds  Musculoskeletal: No clubbing cyanosis or edema   Data Reviewed: Basic Metabolic Panel:  Recent Labs Lab 05/15/14 0558  05/17/14 0500 05/18/14 0500 05/18/14 1200 05/19/14 0018 05/19/14 0432 05/19/14 1715 05/20/14 0330  NA 143  < > 141 140  --  141 140  --  140  K 4.1  < > 3.5* 3.2*  --  3.9 3.6*  --  3.9  CL 104  < > 108 110  --  110 108  --  109  CO2 11*  < > 20 20  --  22 21  --  20  GLUCOSE 367*  < > 105*  88  --  90 90  --  116*  BUN 22  < > 18 12  --  10 9  --  8  CREATININE 2.14*  < > 2.22* 2.20* 2.19* 2.07* 2.11* 1.97* 1.93*  CALCIUM 8.3*  < > 8.0* 8.0*  --  8.3* 8.6  --  8.4  MG 2.2  --   --   --   --   --  1.5  --   --   PHOS 9.2*  --   --   --   --   --  2.3  --  2.1*  < > = values in this interval not displayed. Liver Function Tests:  Recent Labs Lab 05/15/14 0558 05/16/14 0500 05/17/14 0500 05/19/14 0018  05/20/14 0330  AST 96* 112* 101* 60* 33  ALT 73* 57* 37 22 17  ALKPHOS 173* 149* 119* 122* 108  BILITOT <0.2* 0.3 0.4 0.4 0.4  PROT 6.2 6.5 5.8* 6.1 6.2  ALBUMIN 2.4* 2.7* 2.2* 2.3* 2.3*   No results found for this basename: LIPASE, AMYLASE,  in the last 168 hours No results found for this basename: AMMONIA,  in the last 168 hours CBC:  Recent Labs Lab 05/15/14 0558  05/18/14 1200 05/19/14 0018 05/19/14 0432 05/19/14 1715 05/20/14 0330  WBC 18.0*  < > 3.7* 3.3* 3.0* 2.7* 2.8*  NEUTROABS 7.9*  --   --   --   --   --   --   HGB 12.5*  < > 9.2* 10.5* 9.8* 11.4* 10.3*  HCT 38.1*  < > 26.6* 30.7* 29.0* 33.2* 30.4*  MCV 104.4*  < > 96.7 97.5 97.3 97.9 97.1  PLT 206  < > 96* 105* 113* 120* 122*  < > = values in this interval not displayed. Cardiac Enzymes:    Recent Labs Lab 05/15/14 1800 05/15/14 2359 05/16/14 1249 05/16/14 1602 05/16/14 2258 05/19/14 1945  CKTOTAL  --   --   --   --   --  3146*  TROPONINI >20.00* >20.00* >20.00* >20.00* 17.50*  --    BNP (last 3 results) No results found for this basename: PROBNP,  in the last 8760 hours CBG:  Recent Labs Lab 05/15/14 0525 05/15/14 0550 05/15/14 0825  GLUCAP 83 151* 292*    Recent Results (from the past 240 hour(s))  CULTURE, BLOOD (ROUTINE X 2)     Status: None   Collection Time    05/15/14  5:58 AM      Result Value Ref Range Status   Specimen Description BLOOD LEFT HAND   Final   Special Requests BOTTLES DRAWN AEROBIC AND ANAEROBIC 5CC EACH   Final   Culture  Setup Time     Final   Value: 05/15/2014 08:58     Performed at [facility]   Culture     Final   Value:        BLOOD CULTURE RECEIVED NO GROWTH TO DATE CULTURE WILL BE HELD FOR 5 DAYS BEFORE ISSUING A FINAL NEGATIVE REPORT     Performed at [facility]   Report Status PENDING   Incomplete  CULTURE, BLOOD (ROUTINE X 2)     Status: None   Collection Time    05/15/14  6:06 AM      Result Value Ref Range Status   Specimen  Description BLOOD LEFT ARM   Final   Special Requests BOTTLES DRAWN AEROBIC AND ANAEROBIC 4CC EACH   Final   Culture  Setup Time     Final   Value: 05/15/2014 08:58     Performed at [facility]   Culture     Final   Value:        BLOOD CULTURE RECEIVED NO GROWTH TO DATE CULTURE WILL BE HELD FOR 5 DAYS BEFORE ISSUING A FINAL NEGATIVE REPORT     Performed at [facility]   Report Status PENDING   Incomplete  MRSA PCR SCREENING     Status: None   Collection Time    05/15/14  7:53 AM      Result Value Ref Range Status   MRSA by PCR NEGATIVE  NEGATIVE Final   Comment:            The GeneXpert MRSA Assay (FDA     approved for NASAL specimens     only), is one component of a     comprehensive MRSA colonization     surveillance program. It is not     intended to diagnose MRSA     infection nor to guide or     monitor treatment for     MRSA infections.  URINE CULTURE     Status: None   Collection Time    05/15/14  7:57 AM      Result Value Ref Range Status   Specimen Description URINE, CLEAN CATCH   Final   Special Requests NONE   Final   Culture  Setup Time     Final   Value: 05/15/2014 08:15     Performed at [place]     Final   Value: >=100,000 COLONIES/ML     Performed at [facility]   Culture     Final   Value: KLEBSIELLA PNEUMONIAE     Performed at [facility]   Report Status 05/17/2014 FINAL   Final   Organism ID, Bacteria KLEBSIELLA PNEUMONIAE   Final  CLOSTRIDIUM DIFFICILE BY PCR     Status: None   Collection Time    05/15/14  4:26 PM      Result Value Ref Range Status   C difficile by pcr NEGATIVE  NEGATIVE Final     Studies: Dg Tibia/fibula Right  05/20/2014   CLINICAL DATA:  Right lower leg pain, no known trauma.  EXAM: RIGHT TIBIA AND FIBULA - 2 VIEW  COMPARISON:  10/05/2010 ankle radiographs  FINDINGS: No displaced fracture. No aggressive osseous lesion. No radiopaque foreign body. These views are  not optimized to evaluate the joint spaces.  IMPRESSION: No acute or aggressive osseous finding of the right tibia/fibula.   Electronically Signed   By: Carlos Levering M.D.   On: 05/20/2014 02:16   US Renal  05/19/2014   CLINICAL DATA:  Acute renal insufficiency.  EXAM: RENAL/URINARY TRACT ULTRASOUND COMPLETE  COMPARISON:  Renal nuclear medicine scan 03/24/2014.  FINDINGS: Right Kidney:  Right Nephrectomy.  Left Kidney:  Length: 14.5 cm. Echogenicity within normal limits. No mass or significant hydronephrosis visualized. Mild caliectasis most likely normal for solitary kidney.  Bladder:  Appears normal for degree of bladder distention.  IMPRESSION: Right nephrectomy. Mild caliectasis left kidney, most likely normal for a solitary kidney. If symptoms remain a follow-up renal ultrasound can be obtained to exclude developing hydronephrosis on the left.   Electronically Signed   By: Marcello Moores  Register   On: 05/19/2014 20:48    Scheduled Meds: . Abacavir-Dolutegravir-Lamivud  1 tablet Per Tube Daily  . atorvastatin  80 mg Oral q1800  . cefTRIAXone (ROCEPHIN)  IV  1 g Intravenous Q24H  . enoxaparin (LOVENOX) injection  40 mg Subcutaneous Q24H  . feeding supplement (ENSURE COMPLETE)  237 mL Oral TID BM  . ferrous sulfate  325 mg Oral BID WC  . heart attack bouncing book   Does not apply Once  . metoprolol tartrate  25 mg Oral BID  . silver sulfADIAZINE   Topical BID  . ticagrelor  90 mg Oral BID    Continuous Infusions:    Time spent: 25 minutes  [facility] Pager [phone]. If 7PM-7AM, please contact night-coverage at [email or web address], password [facility] 05/20/2014, 4:59 PM  LOS: 5 days

## 2014-05-20 NOTE — Progress Notes (Signed)
*  Preliminary Results* Right lower extremity venous duplex completed. Right lower extremity is negative for deep vein thrombosis. There is no evidence of right Baker's cyst.  05/20/2014 10:17 AM  Maudry Mayhew, RVT, RDCS, RDMS

## 2014-05-21 LAB — CULTURE, BLOOD (ROUTINE X 2)
CULTURE: NO GROWTH
CULTURE: NO GROWTH

## 2014-05-21 MED ORDER — TICAGRELOR 90 MG PO TABS: 90.0000 mg | ORAL_TABLET | Freq: Two times a day (BID) | ORAL | Status: DC

## 2014-05-21 MED ORDER — SILVER SULFADIAZINE 1 % EX CREA: TOPICAL_CREAM | Freq: Two times a day (BID) | CUTANEOUS | Status: DC

## 2014-05-21 MED ORDER — ATORVASTATIN CALCIUM 80 MG PO TABS: 80.0000 mg | ORAL_TABLET | Freq: Every day | ORAL | Status: DC

## 2014-05-21 MED ORDER — METOPROLOL TARTRATE 25 MG PO TABS: 25.0000 mg | ORAL_TABLET | Freq: Two times a day (BID) | ORAL | Status: DC

## 2014-05-21 NOTE — Progress Notes (Signed)
INITIAL NUTRITION ASSESSMENT  DOCUMENTATION CODES Per approved criteria  -Severe malnutrition in the context of chronic illness   INTERVENTION:  Continue Ensure Complete po TID, each supplement provides 350 kcal and 13 grams of protein  RD to follow for nutrition care plan  NUTRITION DIAGNOSIS: Increased nutrient needs related to catabolic illness, malnutrition as evidenced by estimated nutrition needs  Goal: Pt to meet >/= 90% of their estimated nutrition needs   Monitor:  PO & supplemental intake, weight, labs, I/O's  Reason for Assessment: Consult  33 y.o. male  Admitting Dx: Cardiac arrest  ASSESSMENT: 33 year old male with PMH of Hodgkin's disease, HIV and CKD s/p nephrectomy last month; admitted with seizure-like activity and emergently resuscitated after found to be in ventricular arrhythmia plus pulseless electrical activity. Patient initially admitted to ICU. Underwent emergent cardiac catheterization and found to have high-grade circumflex stenoses requiring stent placement. Following this, he underwent PTCA and stenting of OM1 plus proximal circumflex with DES on 10/27.  Pt seen per Clinical Nutrition during previous hospital admission in August 2015.  Sleeping upon this RD visit.  Unable to wake.  Breakfast tray untouched.  PO intake 75-100% per flowsheet records.  Pt with hx of decreased appetite and weight loss.  Dx with severe malnutrition which is ongoing.  Currently receiving Ensure Complete TID.  Dietary recall (per RD assessment 02/24/14): Breakfast: none Lunch: sandwich Dinner: meat, starch, vegetable Snacks/Supplements: Ensure TID  Subcutaneous Fat:   Orbital Region: WNL  Upper Arm Region: severe depletion  Thoracic and Lumbar Region: moderate depletion   Muscle:  Temple Region: WNL  Clavicle Bone Region: severe depletion  Clavicle and Acromion Bone Region: severe depletion  Scapular Bone Region: N/A  Dorsal Hand: N/A Patellar Region: severe  depletion  Anterior Thigh Region: severe depletion  Posterior Calf Region: severe depletion   Edema: none  Patient continues to meet criteria for severe malnutrition in the context of chronic illness as evidenced by < 75% intake of estimated energy requirement for > 1 month, severe muscle loss and severe subcutaneous fat loss.  Height: 6\' 1"  (1.854 m)   Weight: Wt Readings from Last 1 Encounters:  05/20/14 149 lb 14.6 oz (68 kg)    Ideal Body Weight: 184 lb  % Ideal Body Weight: 81%  Wt Readings from Last 20 Encounters:  05/20/14 149 lb 14.6 oz (68 kg)  05/20/14 149 lb 14.6 oz (68 kg)  05/20/14 149 lb 14.6 oz (68 kg)  04/30/14 149 lb 3 oz (67.671 kg)  04/30/14 149 lb 3 oz (67.671 kg)  04/22/14 149 lb 3.2 oz (67.677 kg)  04/01/14 149 lb (67.586 kg)  03/02/14 132 lb 11.2 oz (60.192 kg)  03/02/14 132 lb 11.2 oz (60.192 kg)  02/13/14 143 lb 8 oz (65.091 kg)  01/29/14 149 lb (67.586 kg)  01/22/14 419 lb 3.2 oz (190.148 kg)  01/16/14 135 lb 14.4 oz (61.644 kg)  01/02/14 138 lb 9.6 oz (62.869 kg)  12/19/13 133 lb 14.4 oz (60.737 kg)  12/05/13 129 lb 3.2 oz (58.605 kg)  11/20/13 124 lb (56.246 kg)  10/31/13 120 lb 11.2 oz (54.749 kg)  10/17/13 113 lb 8 oz (51.483 kg)  09/30/13 112 lb 9.6 oz (51.075 kg)    Usual Body Weight: 143 lb  % Usual Body Weight: 104%  BMI:  19.8 kg/m2  Estimated Nutritional Needs: Kcal: 2100-2300 Protein: 110-120 gm Fluid: 2.1-2.3 L  Skin: Intact  Diet Order: Cardiac  EDUCATION NEEDS: -No education needs identified at this time  Intake/Output Summary (Last 24 hours) at 05/21/14 1122 Last data filed at 05/21/14 0635  Gross per 24 hour  Intake    240 ml  Output   1825 ml  Net  -1585 ml    Labs:   Recent Labs Lab 05/15/14 0558  05/19/14 0018 05/19/14 0432 05/19/14 1715 05/20/14 0330  NA 143  < > 141 140  --  140  K 4.1  < > 3.9 3.6*  --  3.9  CL 104  < > 110 108  --  109  CO2 11*  < > 22 21  --  20  BUN 22  < > 10 9  --   8  CREATININE 2.14*  < > 2.07* 2.11* 1.97* 1.93*  CALCIUM 8.3*  < > 8.3* 8.6  --  8.4  MG 2.2  --   --  1.5  --   --   PHOS 9.2*  --   --  2.3  --  2.1*  GLUCOSE 367*  < > 90 90  --  116*  < > = values in this interval not displayed.   Scheduled Meds: . Abacavir-Dolutegravir-Lamivud  1 tablet Per Tube Daily  . atorvastatin  80 mg Oral q1800  . cefTRIAXone (ROCEPHIN)  IV  1 g Intravenous Q24H  . enoxaparin (LOVENOX) injection  40 mg Subcutaneous Q24H  . feeding supplement (ENSURE COMPLETE)  237 mL Oral TID BM  . ferrous sulfate  325 mg Oral BID WC  . heart attack bouncing book   Does not apply Once  . metoprolol tartrate  25 mg Oral BID  . silver sulfADIAZINE   Topical BID  . ticagrelor  90 mg Oral BID    Continuous Infusions:   Past Medical History  Diagnosis Date  . HIV disease 06/25/2012  . Skin lesion 07/03/2012  . Leg pain 07/03/2012  . Sinus tachycardia 08/06/2012  . Penile cyst 08/06/2012  . History of blood transfusion july 2015 per pt  . 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 Chronic renal insufficiency   . Cardiac arrest 05/15/2014  . CKD (chronic kidney disease) s/p Right nephrectomy (04/30/2014) 05/19/2014    Past Surgical History  Procedure Laterality Date  . 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  this after several previous I and Ds of rectal abcess.   [name] node biopsy Right 08/08/2013    Procedure: SUPRACLAVICULAR LYMPH NODE BIOPSY;  Surgeon: Gaye Pollack, MD;  Location: [facility];  Service: Thoracic;  Laterality: Right;  . 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 Tee without cardioversion N/A 02/27/2014    Procedure: TRANSESOPHAGEAL ECHOCARDIOGRAM (TEE);  Surgeon: Pixie Casino, MD;  Location: [facility];  Service: Cardiovascular;  Laterality: N/A;  . Pac placed and later removed    . Right nephrostomy tube  for last month and 1/2  . Laparoscopic nephrectomy Right 04/30/2014     Procedure: LAPAROSCOPIC NEPHRECTOMY AND URETERECTOMY;  Surgeon: Raynelle Bring, MD;  Location: [facility];  Service: Urology;  Laterality: Right;  . Cardiac catheterization  05/19/2014  . Coronary stent placement  05/19/2014    OMI   & PROXIMAL CIRCUMFLEX    Arthur Holms, RD, LDN Pager #: [phone] After-Hours Pager #: [phone]

## 2014-05-21 NOTE — Discharge Instructions (Signed)
Ticagrelor oral tablet What is this medicine? TICAGRELOR (TYE ka GREL or) helps to prevent blood clots. This medicine is used to prevent heart attack, stroke, or other vascular events in people who have had a recent heart attack or who have severe chest pain. This medicine may be used for other purposes; ask your health care provider or pharmacist if you have questions. COMMON BRAND NAME(S): BRILINTA What should I tell my health care provider before I take this medicine? They need to know if you have any of these conditions: -bleeding disorder -bleeding in the brain -liver disease -planned surgery -stomach or intestinal ulcers -stroke or transient ischemic attack -an unusual or allergic reaction to ticagrelor, other medicines, foods, dyes, or preservatives -pregnant or trying to get pregnant -breast-feeding How should I use this medicine? Take this medicine by mouth with a glass of water. Follow the directions on the prescription label. You can take it with or without food. If it upsets your stomach, take it with food. Take your medicine at regular intervals. Do not take it more often than directed. Do not stop taking except on your doctor's advice. Talk to you pediatrician regarding the use of this medicine in children. Special care may be needed. Overdosage: If you think you've taken too much of this medicine contact a poison control center or emergency room at once. Overdosage: If you think you have taken too much of this medicine contact a poison control center or emergency room at once. NOTE: This medicine is only for you. Do not share this medicine with others. What if I miss a dose? If you miss a dose, take it as soon as you can. If it is almost time for your next dose, take only that dose. Do not take double or extra doses. What may interact with this medicine? -certain antibiotics like clarithromycin and telithromycin -certain medicines for fungal infections like itraconazole,  ketoconazole, and voriconazole -certain medicines for HIV infection like atazanavir, indinavir, nelfinavir, ritonavir, and saquinavir -certain medicines for seizures like carbamazepine, phenobarbital, and phenytoin -certain medicines that treat or prevent blood clots like warfarin -dexamethasone -digoxin -lovastatin -nefazodone -rifampin -simvastatin This list may not describe all possible interactions. Give your health care provider a list of all the medicines, herbs, non-prescription drugs, or dietary supplements you use. Also tell them if you smoke, drink alcohol, or use illegal drugs. Some items may interact with your medicine. What should I watch for while using this medicine? Visit your doctor or health care professional for regular check ups. Do not stop taking you medicine unless your doctor tells you to. Notify your doctor or health care professional and seek emergency treatment if you develop breathing problems; changes in vision; chest pain; severe, sudden headache; pain, swelling, warmth in the leg; trouble speaking; sudden numbness or weakness of the face, arm, or leg. These can be signs that your condition has gotten worse. If you are going to have surgery or dental work, tell your doctor or health care professional that you are taking this medicine. You should take aspirin every day with this medicine. Do not take more than 100 mg each day. Talk to your doctor if you have questions. What side effects may I notice from receiving this medicine? Side effects that you should report to your doctor or health care professional as soon as possible: -allergic reactions like skin rash, itching or hives, swelling of the face, lips, or tongue -breathing problems -fast or irregular heartbeat -feeling faint or light-headed, falls -signs and  symptoms of bleeding such as bloody or black, tarry stools; red or dark-brown urine; spitting up blood or brown material that looks like coffee grounds;  red spots on the skin; unusual bruising or bleeding from the eye, gums, or nose Side effects that usually do not require medical attention (Report these to your doctor or health care professional if they continue or are bothersome.): -breast enlargement in both males and females -diarrhea -dizziness -headache -tiredness -upset stomach This list may not describe all possible side effects. Call your doctor for medical advice about side effects. You may report side effects to FDA at 1-800-FDA-1088. Where should I keep my medicine? Keep out of the reach of children. Store at room temperature of 59 to 86 degrees F (15 to 30 degrees C). Throw away any unused medicine after the expiration date. NOTE: This sheet is a summary. It may not cover all possible information. If you have questions about this medicine, talk to your doctor, pharmacist, or health care provider.  2015, [employer]. (2013-10-20 08:31:23)

## 2014-05-21 NOTE — Progress Notes (Signed)
Subjective:  Sitting up in chair. No complaints.  C/O pain right leg, but also has chronic pain both feet and weakness from before.  Objective:  Vital Signs in the last 24 hours: Temp:  [97.9 F (36.6 C)-98.3 F (36.8 C)] 97.9 F (36.6 C) (10/29 0634) Pulse Rate:  [79-93] 93 (10/29 0634) Resp:  [18-20] 18 (10/29 0634) BP: (98-120)/(68-74) 120/74 mmHg (10/29 0634) SpO2:  [96 %-100 %] 100 % (10/29 0634)  Intake/Output from previous day: 10/28 0701 - 10/29 0700 In: 480 [P.O.:480] Out: 2125 [Urine:2125]  Physical Exam: General appearance: alert, cooperative, appears stated age, no distress and slowed mentation  Lungs: clear to auscultation bilaterally  Chest wall: no tenderness  Heart: regular rate and rhythm, S1, S2 normal, no murmur, click, rub or gallop  Abdomen: soft, non-tender; bowel sounds normal; no masses, no organomegaly Extremities: diffuse tenderness to touch, right leg warm and 2 plus edema    Lab Results: BMP  Recent Labs  05/19/14 0018 05/19/14 0432 05/19/14 1715 05/20/14 0330  NA 141 140  --  140  K 3.9 3.6*  --  3.9  CL 110 108  --  109  CO2 22 21  --  20  GLUCOSE 90 90  --  116*  BUN 10 9  --  8  CREATININE 2.07* 2.11* 1.97* 1.93*  CALCIUM 8.3* 8.6  --  8.4  GFRNONAA 41* 40* 44* 45*  GFRAA 48* 46* 50* 52*    CBC  Recent Labs Lab 05/15/14 0558  05/20/14 0330  WBC 18.0*  < > 2.8*  RBC 3.65*  < > 3.13*  HGB 12.5*  < > 10.3*  HCT 38.1*  < > 30.4*  PLT 206  < > 122*  MCV 104.4*  < > 97.1  MCH 34.2*  < > 32.9  MCHC 32.8  < > 33.9  RDW 13.2  < > 12.9  LYMPHSABS 9.2*  --   --   MONOABS 0.7  --   --   EOSABS 0.2  --   --   BASOSABS 0.0  --   --   < > = values in this interval not displayed.  HEMOGLOBIN A1C No results found for this basename: HGBA1C,  MPG    Cardiac Panel (last 3 results)  Recent Labs  01/18/14 0514 02/24/14 1715  05/16/14 1249 05/16/14 1602 05/16/14 2258 05/19/14 1945  CKTOTAL  --  74  --   --   --   --  3146*   TROPONINI <0.30  --   < > >20.00* >20.00* 17.50*  --   < > = values in this interval not displayed.  BNP (last 3 results) No results found for this basename: PROBNP,  in the last 8760 hours  TSH  Recent Labs  06/24/13 1233 08/03/13 1414  TSH 2.143 3.813    CHOLESTEROL  Recent Labs  06/17/13 1658 08/03/13 1414 05/18/14 1200  CHOL 88 112 129    Hepatic Function Panel  Recent Labs  05/17/14 0500 05/19/14 0018 05/20/14 0330  PROT 5.8* 6.1 6.2  ALBUMIN 2.2* 2.3* 2.3*  AST 101* 60* 33  ALT 37 22 17  ALKPHOS 119* 122* 108  BILITOT 0.4 0.4 0.4   Cardiac Studies:  EKG 05/20/2014: normal EKG, normal sinus rhythm, Poor R progression cannot exclude anterior infarct, old anterolateral TWI new since prior EKG, normal QT interval. Pulmonary disease pattern.  Assessment/Plan:  1. NSTEMI   Echo 05/17/2014: Essentially normal echo with moderate pulmonary hypertension. No wall  motion abnormality 2. CAD: 10/17/2013: Stenting of the OM1 and proximal Circumflex with a 3.0 x 16 mm Promus Premier DES, scoring balloon angioplasty of the mid and distal circumflex quality artery with a 2.5 x 10 mm Angiosculpt, balloon angioplasty of the mid segment of the circumflex coronary artery with a 2.0 x 15 mm Euphora.  The stenosis in the high OM1/ramus intermediate was reduced from 80-90% to 0% with maintenance of TIMI 3 flow. Mid and distal Cx stenosis reduced from 99% to 0%.   2. C. Arrest, due to NSTEMI and CAD 3. Acute on chronic renal failure, stable S.Cr. 4. Thrombocytopenia, improving.  Rec: from cardiac standpoint, patient is stable.  He needs to be on dual antiplatelet therapy for at least 1 year, or longer.  He has right leg swelling and edema, very difficult to make out whether he has DVT, lower extremity venous duplex has been ordered.  If indeed he does have DVT, then I will switch to Plavix along with Xarelto or Eliquis for management of the same. I will see the patient as and when  needed, I'll be available for assistance from cardiac standpoint.   Laverda Page, M.D. 05/21/2014, 9:29 AM [facility] Pager: [phone] Office: [phone] If no answer: [phone]
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
## 2014-05-21 NOTE — Progress Notes (Signed)
Discharge instructions given. Pt verbalized understanding and all questions were answered.  

## 2014-05-22 DIAGNOSIS — D649 Anemia, unspecified: Secondary | ICD-10-CM | POA: Diagnosis not present

## 2014-05-22 DIAGNOSIS — N189 Chronic kidney disease, unspecified: Secondary | ICD-10-CM | POA: Diagnosis not present

## 2014-05-22 DIAGNOSIS — I214 Non-ST elevation (NSTEMI) myocardial infarction: Secondary | ICD-10-CM | POA: Diagnosis not present

## 2014-05-22 DIAGNOSIS — Z905 Acquired absence of kidney: Secondary | ICD-10-CM | POA: Diagnosis not present

## 2014-05-22 DIAGNOSIS — Z7901 Long term (current) use of anticoagulants: Secondary | ICD-10-CM | POA: Diagnosis not present

## 2014-05-22 DIAGNOSIS — N390 Urinary tract infection, site not specified: Secondary | ICD-10-CM | POA: Diagnosis not present

## 2014-05-22 DIAGNOSIS — C8190 Hodgkin lymphoma, unspecified, unspecified site: Secondary | ICD-10-CM | POA: Diagnosis not present

## 2014-05-22 DIAGNOSIS — E43 Unspecified severe protein-calorie malnutrition: Secondary | ICD-10-CM | POA: Diagnosis not present

## 2014-05-22 DIAGNOSIS — Z21 Asymptomatic human immunodeficiency virus [HIV] infection status: Secondary | ICD-10-CM | POA: Diagnosis not present

## 2014-05-22 DIAGNOSIS — Z720 Tobacco use: Secondary | ICD-10-CM | POA: Diagnosis not present

## 2014-05-22 DIAGNOSIS — B961 Klebsiella pneumoniae [K. pneumoniae] as the cause of diseases classified elsewhere: Secondary | ICD-10-CM | POA: Diagnosis not present

## 2014-05-25 DIAGNOSIS — N189 Chronic kidney disease, unspecified: Secondary | ICD-10-CM | POA: Diagnosis not present

## 2014-05-25 DIAGNOSIS — C8190 Hodgkin lymphoma, unspecified, unspecified site: Secondary | ICD-10-CM | POA: Diagnosis not present

## 2014-05-25 DIAGNOSIS — N390 Urinary tract infection, site not specified: Secondary | ICD-10-CM | POA: Diagnosis not present

## 2014-05-25 DIAGNOSIS — I214 Non-ST elevation (NSTEMI) myocardial infarction: Secondary | ICD-10-CM | POA: Diagnosis not present

## 2014-05-25 DIAGNOSIS — B961 Klebsiella pneumoniae [K. pneumoniae] as the cause of diseases classified elsewhere: Secondary | ICD-10-CM | POA: Diagnosis not present

## 2014-05-25 DIAGNOSIS — Z21 Asymptomatic human immunodeficiency virus [HIV] infection status: Secondary | ICD-10-CM | POA: Diagnosis not present

## 2014-05-26 DIAGNOSIS — N132 Hydronephrosis with renal and ureteral calculous obstruction: Secondary | ICD-10-CM | POA: Diagnosis not present

## 2014-05-26 DIAGNOSIS — N135 Crossing vessel and stricture of ureter without hydronephrosis: Secondary | ICD-10-CM | POA: Diagnosis not present

## 2014-05-28 ENCOUNTER — Other Ambulatory Visit: Payer: Self-pay | Admitting: Internal Medicine

## 2014-05-28 ENCOUNTER — Other Ambulatory Visit: Payer: Self-pay | Admitting: *Deleted

## 2014-05-28 DIAGNOSIS — D638 Anemia in other chronic diseases classified elsewhere: Secondary | ICD-10-CM

## 2014-05-28 MED ORDER — FERROUS SULFATE 325 (65 FE) MG PO TABS: 325.0000 mg | ORAL_TABLET | Freq: Two times a day (BID) | ORAL | Status: DC

## 2014-06-02 DIAGNOSIS — Z21 Asymptomatic human immunodeficiency virus [HIV] infection status: Secondary | ICD-10-CM | POA: Diagnosis not present

## 2014-06-02 DIAGNOSIS — N390 Urinary tract infection, site not specified: Secondary | ICD-10-CM | POA: Diagnosis not present

## 2014-06-02 DIAGNOSIS — C8190 Hodgkin lymphoma, unspecified, unspecified site: Secondary | ICD-10-CM | POA: Diagnosis not present

## 2014-06-02 DIAGNOSIS — B961 Klebsiella pneumoniae [K. pneumoniae] as the cause of diseases classified elsewhere: Secondary | ICD-10-CM | POA: Diagnosis not present

## 2014-06-02 DIAGNOSIS — I214 Non-ST elevation (NSTEMI) myocardial infarction: Secondary | ICD-10-CM | POA: Diagnosis not present

## 2014-06-02 DIAGNOSIS — N189 Chronic kidney disease, unspecified: Secondary | ICD-10-CM | POA: Diagnosis not present

## 2014-06-05 DIAGNOSIS — N189 Chronic kidney disease, unspecified: Secondary | ICD-10-CM | POA: Diagnosis not present

## 2014-06-05 DIAGNOSIS — C8190 Hodgkin lymphoma, unspecified, unspecified site: Secondary | ICD-10-CM | POA: Diagnosis not present

## 2014-06-05 DIAGNOSIS — I214 Non-ST elevation (NSTEMI) myocardial infarction: Secondary | ICD-10-CM | POA: Diagnosis not present

## 2014-06-05 DIAGNOSIS — N390 Urinary tract infection, site not specified: Secondary | ICD-10-CM | POA: Diagnosis not present

## 2014-06-05 DIAGNOSIS — B961 Klebsiella pneumoniae [K. pneumoniae] as the cause of diseases classified elsewhere: Secondary | ICD-10-CM | POA: Diagnosis not present

## 2014-06-05 DIAGNOSIS — Z21 Asymptomatic human immunodeficiency virus [HIV] infection status: Secondary | ICD-10-CM | POA: Diagnosis not present

## 2014-06-10 DIAGNOSIS — N189 Chronic kidney disease, unspecified: Secondary | ICD-10-CM | POA: Diagnosis not present

## 2014-06-10 DIAGNOSIS — I214 Non-ST elevation (NSTEMI) myocardial infarction: Secondary | ICD-10-CM | POA: Diagnosis not present

## 2014-06-10 DIAGNOSIS — N390 Urinary tract infection, site not specified: Secondary | ICD-10-CM | POA: Diagnosis not present

## 2014-06-10 DIAGNOSIS — Z21 Asymptomatic human immunodeficiency virus [HIV] infection status: Secondary | ICD-10-CM | POA: Diagnosis not present

## 2014-06-10 DIAGNOSIS — B961 Klebsiella pneumoniae [K. pneumoniae] as the cause of diseases classified elsewhere: Secondary | ICD-10-CM | POA: Diagnosis not present

## 2014-06-10 DIAGNOSIS — C8190 Hodgkin lymphoma, unspecified, unspecified site: Secondary | ICD-10-CM | POA: Diagnosis not present

## 2014-06-19 DIAGNOSIS — N390 Urinary tract infection, site not specified: Secondary | ICD-10-CM | POA: Diagnosis not present

## 2014-06-19 DIAGNOSIS — Z21 Asymptomatic human immunodeficiency virus [HIV] infection status: Secondary | ICD-10-CM | POA: Diagnosis not present

## 2014-06-19 DIAGNOSIS — I214 Non-ST elevation (NSTEMI) myocardial infarction: Secondary | ICD-10-CM | POA: Diagnosis not present

## 2014-06-19 DIAGNOSIS — N189 Chronic kidney disease, unspecified: Secondary | ICD-10-CM | POA: Diagnosis not present

## 2014-06-19 DIAGNOSIS — B961 Klebsiella pneumoniae [K. pneumoniae] as the cause of diseases classified elsewhere: Secondary | ICD-10-CM | POA: Diagnosis not present

## 2014-06-19 DIAGNOSIS — C8190 Hodgkin lymphoma, unspecified, unspecified site: Secondary | ICD-10-CM | POA: Diagnosis not present

## 2014-06-23 NOTE — Progress Notes (Signed)
Patient ID: Thomas [name], male   DOB: [birth year], 33 y.o.   MRN: [id number]  Nohlan explained he does not need [facility] services at this time. CM enrolled client into TAP program until further notice.

## 2014-06-24 ENCOUNTER — Other Ambulatory Visit: Payer: Medicare Other

## 2014-06-24 DIAGNOSIS — B20 Human immunodeficiency virus [HIV] disease: Secondary | ICD-10-CM

## 2014-06-24 LAB — CBC WITH DIFFERENTIAL/PLATELET
Basophils Absolute: 0 10*3/uL (ref 0.0–0.1)
Basophils Relative: 0 % (ref 0–1)
EOS ABS: 0.1 10*3/uL (ref 0.0–0.7)
Eosinophils Relative: 2 % (ref 0–5)
HEMATOCRIT: 36.8 % — AB (ref 39.0–52.0)
Hemoglobin: 12.7 g/dL — ABNORMAL LOW (ref 13.0–17.0)
LYMPHS ABS: 1.4 10*3/uL (ref 0.7–4.0)
Lymphocytes Relative: 38 % (ref 12–46)
MCH: 33.7 pg (ref 26.0–34.0)
MCHC: 34.5 g/dL (ref 30.0–36.0)
MCV: 97.6 fL (ref 78.0–100.0)
MONOS PCT: 6 % (ref 3–12)
MPV: 9.5 fL (ref 9.4–12.4)
Monocytes Absolute: 0.2 10*3/uL (ref 0.1–1.0)
NEUTROS ABS: 2.1 10*3/uL (ref 1.7–7.7)
Neutrophils Relative %: 54 % (ref 43–77)
Platelets: 141 10*3/uL — ABNORMAL LOW (ref 150–400)
RBC: 3.77 MIL/uL — ABNORMAL LOW (ref 4.22–5.81)
RDW: 14.6 % (ref 11.5–15.5)
WBC: 3.8 10*3/uL — ABNORMAL LOW (ref 4.0–10.5)

## 2014-06-24 LAB — COMPLETE METABOLIC PANEL WITH GFR
ALT: 13 U/L (ref 0–53)
AST: 14 U/L (ref 0–37)
Albumin: 3.6 g/dL (ref 3.5–5.2)
Alkaline Phosphatase: 173 U/L — ABNORMAL HIGH (ref 39–117)
BILIRUBIN TOTAL: 0.4 mg/dL (ref 0.2–1.2)
BUN: 21 mg/dL (ref 6–23)
CALCIUM: 8.8 mg/dL (ref 8.4–10.5)
CHLORIDE: 110 meq/L (ref 96–112)
CO2: 25 mEq/L (ref 19–32)
Creat: 2.06 mg/dL — ABNORMAL HIGH (ref 0.50–1.35)
GFR, Est African American: 48 mL/min — ABNORMAL LOW
GFR, Est Non African American: 42 mL/min — ABNORMAL LOW
GLUCOSE: 82 mg/dL (ref 70–99)
POTASSIUM: 3.8 meq/L (ref 3.5–5.3)
SODIUM: 140 meq/L (ref 135–145)
TOTAL PROTEIN: 6.5 g/dL (ref 6.0–8.3)

## 2014-06-25 LAB — T-HELPER CELL (CD4) - (RCID CLINIC ONLY)
CD4 % Helper T Cell: 29 % — ABNORMAL LOW (ref 33–55)
CD4 T Cell Abs: 400 /uL (ref 400–2700)

## 2014-06-25 LAB — HIV-1 RNA QUANT-NO REFLEX-BLD
HIV 1 RNA Quant: <20 copies/mL (ref <20)
HIV-1 RNA Quant, Log: <1.3 {Log} (ref <1.30)

## 2014-06-29 ENCOUNTER — Other Ambulatory Visit: Payer: Medicare Other

## 2014-07-02 ENCOUNTER — Encounter (HOSPITAL_COMMUNITY): Payer: Self-pay | Admitting: Cardiology

## 2014-07-02 DIAGNOSIS — Z21 Asymptomatic human immunodeficiency virus [HIV] infection status: Secondary | ICD-10-CM | POA: Diagnosis not present

## 2014-07-02 DIAGNOSIS — I214 Non-ST elevation (NSTEMI) myocardial infarction: Secondary | ICD-10-CM | POA: Diagnosis not present

## 2014-07-02 DIAGNOSIS — N189 Chronic kidney disease, unspecified: Secondary | ICD-10-CM | POA: Diagnosis not present

## 2014-07-02 DIAGNOSIS — C8190 Hodgkin lymphoma, unspecified, unspecified site: Secondary | ICD-10-CM | POA: Diagnosis not present

## 2014-07-02 DIAGNOSIS — B961 Klebsiella pneumoniae [K. pneumoniae] as the cause of diseases classified elsewhere: Secondary | ICD-10-CM | POA: Diagnosis not present

## 2014-07-02 DIAGNOSIS — N390 Urinary tract infection, site not specified: Secondary | ICD-10-CM | POA: Diagnosis not present

## 2014-07-07 ENCOUNTER — Ambulatory Visit: Payer: Medicare Other | Admitting: Internal Medicine

## 2014-07-09 ENCOUNTER — Ambulatory Visit: Payer: Medicare Other | Admitting: Internal Medicine

## 2014-07-09 DIAGNOSIS — R509 Fever, unspecified: Secondary | ICD-10-CM | POA: Diagnosis not present

## 2014-07-16 DIAGNOSIS — N390 Urinary tract infection, site not specified: Secondary | ICD-10-CM | POA: Diagnosis not present

## 2014-07-16 DIAGNOSIS — Z21 Asymptomatic human immunodeficiency virus [HIV] infection status: Secondary | ICD-10-CM | POA: Diagnosis not present

## 2014-07-16 DIAGNOSIS — B961 Klebsiella pneumoniae [K. pneumoniae] as the cause of diseases classified elsewhere: Secondary | ICD-10-CM | POA: Diagnosis not present

## 2014-07-16 DIAGNOSIS — C8190 Hodgkin lymphoma, unspecified, unspecified site: Secondary | ICD-10-CM | POA: Diagnosis not present

## 2014-07-16 DIAGNOSIS — I214 Non-ST elevation (NSTEMI) myocardial infarction: Secondary | ICD-10-CM | POA: Diagnosis not present

## 2014-07-16 DIAGNOSIS — N189 Chronic kidney disease, unspecified: Secondary | ICD-10-CM | POA: Diagnosis not present

## 2014-07-27 ENCOUNTER — Other Ambulatory Visit: Payer: Self-pay | Admitting: *Deleted

## 2014-07-27 DIAGNOSIS — E43 Unspecified severe protein-calorie malnutrition: Secondary | ICD-10-CM

## 2014-07-27 MED ORDER — ENSURE COMPLETE PO LIQD: 237.0000 mL | Freq: Three times a day (TID) | ORAL | Status: DC

## 2014-08-05 DIAGNOSIS — I2510 Atherosclerotic heart disease of native coronary artery without angina pectoris: Secondary | ICD-10-CM | POA: Diagnosis not present

## 2014-08-05 DIAGNOSIS — Z8674 Personal history of sudden cardiac arrest: Secondary | ICD-10-CM | POA: Diagnosis not present

## 2014-08-05 DIAGNOSIS — I252 Old myocardial infarction: Secondary | ICD-10-CM | POA: Diagnosis not present

## 2014-08-05 DIAGNOSIS — N183 Chronic kidney disease, stage 3 (moderate): Secondary | ICD-10-CM | POA: Diagnosis not present

## 2014-09-02 ENCOUNTER — Telehealth: Payer: Self-pay | Admitting: *Deleted

## 2014-09-02 NOTE — Telephone Encounter (Signed)
Patient is scheduled with [facility] on Friday, 2/12 at 12:00.  RN encouraged patient to keep this, he stated he would. Landis Gandy, RN

## 2014-09-04 DIAGNOSIS — N183 Chronic kidney disease, stage 3 (moderate): Secondary | ICD-10-CM | POA: Diagnosis not present

## 2014-09-04 DIAGNOSIS — I2510 Atherosclerotic heart disease of native coronary artery without angina pectoris: Secondary | ICD-10-CM | POA: Diagnosis not present

## 2014-09-04 DIAGNOSIS — Z8571 Personal history of Hodgkin lymphoma: Secondary | ICD-10-CM | POA: Diagnosis not present

## 2014-09-08 ENCOUNTER — Other Ambulatory Visit: Payer: Self-pay | Admitting: *Deleted

## 2014-09-08 ENCOUNTER — Telehealth: Payer: Self-pay | Admitting: Oncology

## 2014-09-08 NOTE — Telephone Encounter (Signed)
Confirmed appointment for March. Mailed calendar.

## 2014-09-11 DIAGNOSIS — I2510 Atherosclerotic heart disease of native coronary artery without angina pectoris: Secondary | ICD-10-CM | POA: Diagnosis not present

## 2014-09-11 DIAGNOSIS — N183 Chronic kidney disease, stage 3 (moderate): Secondary | ICD-10-CM | POA: Diagnosis not present

## 2014-09-29 ENCOUNTER — Other Ambulatory Visit: Payer: Medicare Other

## 2014-10-09 ENCOUNTER — Telehealth: Payer: Self-pay | Admitting: Oncology

## 2014-10-09 ENCOUNTER — Other Ambulatory Visit (HOSPITAL_BASED_OUTPATIENT_CLINIC_OR_DEPARTMENT_OTHER): Payer: Medicare Other

## 2014-10-09 ENCOUNTER — Ambulatory Visit (HOSPITAL_BASED_OUTPATIENT_CLINIC_OR_DEPARTMENT_OTHER): Payer: Medicare Other | Admitting: Oncology

## 2014-10-09 VITALS — BP 125/83 | Temp 98.1°F | Resp 17 | Ht 67.32 in | Wt 170.0 lb

## 2014-10-09 DIAGNOSIS — N289 Disorder of kidney and ureter, unspecified: Secondary | ICD-10-CM | POA: Diagnosis not present

## 2014-10-09 DIAGNOSIS — D649 Anemia, unspecified: Secondary | ICD-10-CM | POA: Diagnosis not present

## 2014-10-09 DIAGNOSIS — C8171 Other classical Hodgkin lymphoma, lymph nodes of head, face, and neck: Secondary | ICD-10-CM | POA: Diagnosis present

## 2014-10-09 DIAGNOSIS — C8190 Hodgkin lymphoma, unspecified, unspecified site: Secondary | ICD-10-CM

## 2014-10-09 DIAGNOSIS — B20 Human immunodeficiency virus [HIV] disease: Secondary | ICD-10-CM | POA: Diagnosis not present

## 2014-10-09 LAB — CBC WITH DIFFERENTIAL/PLATELET
BASO%: 0.8 % (ref 0.0–2.0)
Basophils Absolute: 0 10*3/uL (ref 0.0–0.1)
EOS ABS: 0.1 10*3/uL (ref 0.0–0.5)
EOS%: 1.6 % (ref 0.0–7.0)
HCT: 39.5 % (ref 38.4–49.9)
HGB: 12.7 g/dL — ABNORMAL LOW (ref 13.0–17.1)
LYMPH%: 37.4 % (ref 14.0–49.0)
MCH: 34.8 pg — ABNORMAL HIGH (ref 27.2–33.4)
MCHC: 32.2 g/dL (ref 32.0–36.0)
MCV: 108.1 fL — ABNORMAL HIGH (ref 79.3–98.0)
MONO#: 0.3 10*3/uL (ref 0.1–0.9)
MONO%: 6.6 % (ref 0.0–14.0)
NEUT%: 53.6 % (ref 39.0–75.0)
NEUTROS ABS: 2.1 10*3/uL (ref 1.5–6.5)
Platelets: 174 10*3/uL (ref 140–400)
RBC: 3.66 10*6/uL — AB (ref 4.20–5.82)
RDW: 14.6 % (ref 11.0–14.6)
WBC: 4 10*3/uL (ref 4.0–10.3)
lymph#: 1.5 10*3/uL (ref 0.9–3.3)

## 2014-10-09 LAB — COMPREHENSIVE METABOLIC PANEL (CC13)
ALK PHOS: 199 U/L — AB (ref 40–150)
ALT: 11 U/L (ref 0–55)
AST: 11 U/L (ref 5–34)
Albumin: 3.8 g/dL (ref 3.5–5.0)
Anion Gap: 9 mEq/L (ref 3–11)
BUN: 17.5 mg/dL (ref 7.0–26.0)
CALCIUM: 8.7 mg/dL (ref 8.4–10.4)
CHLORIDE: 111 meq/L — AB (ref 98–109)
CO2: 20 mEq/L — ABNORMAL LOW (ref 22–29)
CREATININE: 3 mg/dL — AB (ref 0.7–1.3)
EGFR: 31 mL/min/{1.73_m2} — AB (ref >90)
GLUCOSE: 75 mg/dL (ref 70–140)
POTASSIUM: 3.9 meq/L (ref 3.5–5.1)
Sodium: 141 mEq/L (ref 136–145)
Total Bilirubin: 0.28 mg/dL (ref 0.20–1.20)
Total Protein: 8.7 g/dL — ABNORMAL HIGH (ref 6.4–8.3)

## 2014-10-09 NOTE — Telephone Encounter (Signed)
gv and printed appt sched and avs for pt for June... °

## 2014-10-09 NOTE — Progress Notes (Signed)
Hematology and Oncology Follow Up Visit  Thomas [name] [id number] Nov 23, 1980 34 y.o. 10/09/2014 3:57 PM   Principle Diagnosis:  34 year old HIV positive gentleman diagnosed with Hodgkin's disease in January of 2015 after he presented with anemia and diffuse lymphadenopathy. Stage IIIA disease.    Prior Therapy:  He is status post supraclavicular right lymph node biopsy done on 08/02/2013. Systemic chemotherapy in the form of ABVD started on 10/03/2013. He S/P 5 cycles last of which given in July 2015. Therapy discontinued due to sepsis, kidney surgery and myocardial infarction.   Current therapy: Observation and surveillance.  Interim History:  Thomas [name] presents today for a followup visit by himself. Since his last visit, he had a rather complicated medical history since the conclusion of chemotherapy. He developed sepsis and required a Port-A-Cath be removed and subsequently underwent kidney operation for non-functioning right kidney. He had a laparoscopic procedure done on 04/30/2014. On 05/16/2014 he suffered a cardiac arrest and had a non-ST segment elevation myocardial infarction. He underwent catheterization and stenting and made a full recovery at this time. He has been followed by Dr. Einar Gip and made for recovery. He has been doing well since that time without any evidence to suggest recurrent lymphoma.    He denies any nausea, vomiting or abdominal pain. He does not report any chest pain or shortness of breath. He denies fevers or chills or sweats. Denied pruritus or any constitutional symptoms. He is no longer reporting any lymphadenopathy. Denied hematochezia or melena or any bleeding problems. He does not report any headaches or blurry vision or double vision. Did not report any alteration of mental status her psychiatric issues. As that report any syncope or seizures. Does not report any chest pain palpitation or difficulty breathing. Does not report any leg edema or orthopnea. He  does not report any abdominal distention or early satiety. As that report any ascites. Does not report any musculoskeletal complaints. Does not report any arthralgias or myalgias or skin rashes or lymphadenopathy. Is not reporting any petechiae rashes or bleeding. Rest of his review of system is unremarkable.  Medications: I have reviewed the patient's current medications. Unchanged by my review today Current Outpatient Prescriptions  Medication Sig Dispense Refill  . Abacavir-Dolutegravir-Lamivud 600-50-300 MG TABS Take 1 tablet by mouth at bedtime.    Marland Kitchen atorvastatin (LIPITOR) 80 MG tablet Take 1 tablet (80 mg total) by mouth daily at 6 PM. 30 tablet 1  . calcium carbonate (TUMS - DOSED IN MG ELEMENTAL CALCIUM) 500 MG chewable tablet Chew 4 tablets by mouth 3 (three) times daily.    Marland Kitchen docusate sodium (COLACE) 100 MG capsule Take 1 capsule (100 mg total) by mouth 2 (two) times daily. 30 capsule 0  . feeding supplement, ENSURE COMPLETE, (ENSURE COMPLETE) LIQD Take 237 mLs by mouth 3 (three) times daily between meals. 237 mL 6  . ferrous sulfate 325 (65 FE) MG tablet Take 1 tablet (325 mg total) by mouth 2 (two) times daily with a meal. 60 tablet 5  . metoprolol tartrate (LOPRESSOR) 25 MG tablet Take 1 tablet (25 mg total) by mouth 2 (two) times daily. 60 tablet 1  . oxyCODONE (ROXICODONE) 5 MG immediate release tablet Take 1 tablet (5 mg total) by mouth every 4 (four) hours as needed for severe pain. 30 tablet 0  . prochlorperazine (COMPAZINE) 10 MG tablet Take 10 mg by mouth every 6 (six) hours as needed for nausea or vomiting.    . silver sulfADIAZINE (SILVADENE) 1 %  cream Apply topically 2 (two) times daily. 50 g 0  . ticagrelor (BRILINTA) 90 MG TABS tablet Take 1 tablet (90 mg total) by mouth 2 (two) times daily. 60 tablet 6   No current facility-administered medications for this visit.   Facility-Administered Medications Ordered in Other Visits  Medication Dose Route Frequency Provider Last  Rate Last Dose  . sodium chloride 0.9 % injection 10 mL  10 mL Intravenous PRN Wyatt Portela, MD   10 mL at 10/31/13 1601     Allergies:  Allergies  Allergen Reactions  . Tylenol [Acetaminophen] Other (See Comments)    sweats    Past Medical History, Surgical history, Social history, and Family History were reviewed and updated.    Physical Exam: Blood pressure 125/83, temperature 98.1 F (36.7 C), temperature source Oral, resp. rate 17, height 5' 7.32" (1.71 m), weight 170 lb (77.111 kg).  ECOG: 1  General appearance: alert and cooperative not in any distress. Head: Normocephalic, without obvious abnormality, atraumatic Neck: no carotid bruit no thyroid masses or lymphadenopathy. Lymph nodes: No lymphadenopathy noted.  Heart:regular rate and rhythm, S1, S2 normal, no murmur, click, rub or gallop Lung:chest clear, no wheezing, rales, normal symmetric air entry Abdomin: soft, non-tender, without masses or organomegaly EXT:no erythema, induration, or nodules Skin: Dry skin noted. Neurological exam: No motor or since deficits.    Lab Results: Lab Results  Component Value Date   WBC 4.0 10/09/2014   HGB 12.7* 10/09/2014   HCT 39.5 10/09/2014   MCV 108.1* 10/09/2014   PLT 174 10/09/2014     Chemistry      Component Value Date/Time   NA 141 10/09/2014 1506   NA 140 06/24/2014 1202   K 3.9 10/09/2014 1506   K 3.8 06/24/2014 1202   CL 110 06/24/2014 1202   CO2 20* 10/09/2014 1506   CO2 25 06/24/2014 1202   BUN 17.5 10/09/2014 1506   BUN 21 06/24/2014 1202   CREATININE 3.0* 10/09/2014 1506   CREATININE 2.06* 06/24/2014 1202   CREATININE 1.93* 05/20/2014 0330      Component Value Date/Time   CALCIUM 8.7 10/09/2014 1506   CALCIUM 8.8 06/24/2014 1202   ALKPHOS 199* 10/09/2014 1506   ALKPHOS 173* 06/24/2014 1202   AST 11 10/09/2014 1506   AST 14 06/24/2014 1202   ALT 11 10/09/2014 1506   ALT 13 06/24/2014 1202   BILITOT 0.28 10/09/2014 1506   BILITOT 0.4  06/24/2014 1202      Impression and Plan:  34 year old gentleman with the following issues:  1. Hodgkin's disease presented with lymphadenopathy above and below the diaphragm as well as bone marrow involvement without any constitutional symptoms indicating at least stage IIIA. He is status post 5 cycles of ABVD chemotherapy interrupted due to acute illness outlined above. That included a sepsis, kidney operation and cardiac arrest. He is fully recovered at this time and have no signs or symptoms of recurrent disease. His last PET/CT scan after 3 cycles of ABVD showed near complete response. The plan is to resume active surveillance at this time and repeat PET scan in the near future and institute salvage chemotherapy only if he has recurrent disease.   2. Anemia: Likely related to Hodgkin's disease. His hemoglobin is near normal at this time.  3. IV access: Port-A-Cath has been removed at this time.  4. HIV: He is on HIV medication and followed by infectious disease service.   5.  renal insufficiency: His creatinine has increased slightly from  baseline of 2 to around 3. Not sure if he follows up with nephrology he might need a referral if he does not.  6. Followup: Will be in 3 months sooner if the PET scan shows any abnormalities.   [facility], MD 3/18/20163:57 PM

## 2014-10-13 ENCOUNTER — Ambulatory Visit: Payer: Medicare Other | Admitting: Internal Medicine

## 2014-10-23 ENCOUNTER — Ambulatory Visit (HOSPITAL_COMMUNITY): Admission: RE | Admit: 2014-10-23 | Payer: Medicare Other | Source: Ambulatory Visit

## 2014-11-06 ENCOUNTER — Encounter (HOSPITAL_COMMUNITY)
Admission: RE | Admit: 2014-11-06 | Discharge: 2014-11-06 | Disposition: A | Discharge status: Home / Self Care | Payer: Medicare Other | Source: Ambulatory Visit | Attending: Oncology | Admitting: Oncology

## 2014-11-06 DIAGNOSIS — N201 Calculus of ureter: Secondary | ICD-10-CM | POA: Diagnosis not present

## 2014-11-06 DIAGNOSIS — Z21 Asymptomatic human immunodeficiency virus [HIV] infection status: Secondary | ICD-10-CM | POA: Diagnosis not present

## 2014-11-06 DIAGNOSIS — N1330 Unspecified hydronephrosis: Secondary | ICD-10-CM | POA: Diagnosis not present

## 2014-11-06 DIAGNOSIS — C8190 Hodgkin lymphoma, unspecified, unspecified site: Secondary | ICD-10-CM | POA: Insufficient documentation

## 2014-11-06 DIAGNOSIS — N132 Hydronephrosis with renal and ureteral calculous obstruction: Secondary | ICD-10-CM | POA: Diagnosis not present

## 2014-11-06 DIAGNOSIS — N134 Hydroureter: Secondary | ICD-10-CM | POA: Diagnosis not present

## 2014-11-06 DIAGNOSIS — Z79899 Other long term (current) drug therapy: Secondary | ICD-10-CM | POA: Insufficient documentation

## 2014-11-06 LAB — GLUCOSE, CAPILLARY: GLUCOSE-CAPILLARY: 86 mg/dL (ref 70–99)

## 2014-11-06 MED ORDER — FLUDEOXYGLUCOSE F - 18 (FDG) INJECTION
8.4700 | Freq: Once | INTRAVENOUS | Status: AC | PRN
  Administered 2014-11-06: 8.47 via INTRAVENOUS

## 2014-11-10 ENCOUNTER — Telehealth: Payer: Self-pay | Admitting: *Deleted

## 2014-11-10 NOTE — Telephone Encounter (Signed)
Per Dr. Alen Blew, I informed patient that his scan was good. Patient verbalized understanding.

## 2014-11-10 NOTE — Telephone Encounter (Signed)
-----   Message from Wyatt Portela, MD sent at 11/09/2014  8:52 AM EDT ----- Please let him know that his scan are good.

## 2014-11-17 ENCOUNTER — Other Ambulatory Visit: Payer: Self-pay | Admitting: Internal Medicine

## 2014-11-24 ENCOUNTER — Encounter: Payer: Self-pay | Admitting: Oncology

## 2014-11-24 NOTE — Progress Notes (Signed)
I placed the form for home care services on the desk of nurse for dr. Alen Blew.

## 2014-11-25 ENCOUNTER — Encounter: Payer: Self-pay | Admitting: Oncology

## 2014-11-25 NOTE — Progress Notes (Signed)
I faxed forms to [facility]  [phone]

## 2014-11-26 ENCOUNTER — Telehealth: Payer: Self-pay | Admitting: *Deleted

## 2014-11-26 NOTE — Telephone Encounter (Signed)
Called [facility] per Dr. Linus Salmons.  He would defer the PCS request to the patient's primary care physician, cardiologist, or oncologist.  Patient does need follow up with ID, but his HIV does not determine the need for home care services. Landis Gandy, RN

## 2014-12-10 ENCOUNTER — Encounter (HOSPITAL_COMMUNITY): Payer: Self-pay | Admitting: Emergency Medicine

## 2014-12-10 ENCOUNTER — Emergency Department (HOSPITAL_COMMUNITY): Payer: Medicare Other

## 2014-12-10 ENCOUNTER — Inpatient Hospital Stay (HOSPITAL_COMMUNITY): Payer: Medicare Other

## 2014-12-10 ENCOUNTER — Inpatient Hospital Stay (HOSPITAL_COMMUNITY)
Admission: EM | Admit: 2014-12-10 | Discharge: 2014-12-13 | DRG: 668 | Disposition: A | Discharge status: Home / Self Care | Payer: Medicare Other | Attending: Internal Medicine | Admitting: Internal Medicine

## 2014-12-10 DIAGNOSIS — N183 Chronic kidney disease, stage 3 (moderate): Secondary | ICD-10-CM | POA: Diagnosis not present

## 2014-12-10 DIAGNOSIS — N136 Pyonephrosis: Secondary | ICD-10-CM | POA: Diagnosis present

## 2014-12-10 DIAGNOSIS — N201 Calculus of ureter: Secondary | ICD-10-CM | POA: Diagnosis present

## 2014-12-10 DIAGNOSIS — R000 Tachycardia, unspecified: Secondary | ICD-10-CM | POA: Diagnosis not present

## 2014-12-10 DIAGNOSIS — E876 Hypokalemia: Secondary | ICD-10-CM | POA: Diagnosis present

## 2014-12-10 DIAGNOSIS — Z888 Allergy status to other drugs, medicaments and biological substances status: Secondary | ICD-10-CM

## 2014-12-10 DIAGNOSIS — J45909 Unspecified asthma, uncomplicated: Secondary | ICD-10-CM | POA: Diagnosis not present

## 2014-12-10 DIAGNOSIS — I2510 Atherosclerotic heart disease of native coronary artery without angina pectoris: Secondary | ICD-10-CM | POA: Diagnosis present

## 2014-12-10 DIAGNOSIS — Z8674 Personal history of sudden cardiac arrest: Secondary | ICD-10-CM | POA: Diagnosis not present

## 2014-12-10 DIAGNOSIS — Z79899 Other long term (current) drug therapy: Secondary | ICD-10-CM

## 2014-12-10 DIAGNOSIS — Z936 Other artificial openings of urinary tract status: Secondary | ICD-10-CM

## 2014-12-10 DIAGNOSIS — N139 Obstructive and reflux uropathy, unspecified: Secondary | ICD-10-CM | POA: Diagnosis present

## 2014-12-10 DIAGNOSIS — A419 Sepsis, unspecified organism: Secondary | ICD-10-CM | POA: Diagnosis present

## 2014-12-10 DIAGNOSIS — N1330 Unspecified hydronephrosis: Secondary | ICD-10-CM | POA: Diagnosis not present

## 2014-12-10 DIAGNOSIS — R944 Abnormal results of kidney function studies: Secondary | ICD-10-CM | POA: Diagnosis not present

## 2014-12-10 DIAGNOSIS — R7989 Other specified abnormal findings of blood chemistry: Secondary | ICD-10-CM

## 2014-12-10 DIAGNOSIS — B20 Human immunodeficiency virus [HIV] disease: Secondary | ICD-10-CM | POA: Diagnosis not present

## 2014-12-10 DIAGNOSIS — Z87891 Personal history of nicotine dependence: Secondary | ICD-10-CM | POA: Diagnosis not present

## 2014-12-10 DIAGNOSIS — Z905 Acquired absence of kidney: Secondary | ICD-10-CM | POA: Diagnosis present

## 2014-12-10 DIAGNOSIS — Z955 Presence of coronary angioplasty implant and graft: Secondary | ICD-10-CM | POA: Diagnosis not present

## 2014-12-10 DIAGNOSIS — M79606 Pain in leg, unspecified: Secondary | ICD-10-CM | POA: Diagnosis not present

## 2014-12-10 DIAGNOSIS — Z9221 Personal history of antineoplastic chemotherapy: Secondary | ICD-10-CM | POA: Diagnosis not present

## 2014-12-10 DIAGNOSIS — E785 Hyperlipidemia, unspecified: Secondary | ICD-10-CM | POA: Diagnosis present

## 2014-12-10 DIAGNOSIS — Z21 Asymptomatic human immunodeficiency virus [HIV] infection status: Secondary | ICD-10-CM | POA: Diagnosis present

## 2014-12-10 DIAGNOSIS — N179 Acute kidney failure, unspecified: Principal | ICD-10-CM | POA: Diagnosis present

## 2014-12-10 DIAGNOSIS — N211 Calculus in urethra: Secondary | ICD-10-CM | POA: Diagnosis not present

## 2014-12-10 DIAGNOSIS — N200 Calculus of kidney: Secondary | ICD-10-CM

## 2014-12-10 DIAGNOSIS — I252 Old myocardial infarction: Secondary | ICD-10-CM | POA: Diagnosis not present

## 2014-12-10 DIAGNOSIS — N189 Chronic kidney disease, unspecified: Secondary | ICD-10-CM | POA: Diagnosis not present

## 2014-12-10 DIAGNOSIS — N12 Tubulo-interstitial nephritis, not specified as acute or chronic: Secondary | ICD-10-CM | POA: Diagnosis not present

## 2014-12-10 DIAGNOSIS — N390 Urinary tract infection, site not specified: Secondary | ICD-10-CM | POA: Diagnosis not present

## 2014-12-10 DIAGNOSIS — R740 Nonspecific elevation of levels of transaminase and lactic acid dehydrogenase [LDH]: Secondary | ICD-10-CM | POA: Diagnosis not present

## 2014-12-10 DIAGNOSIS — C8190 Hodgkin lymphoma, unspecified, unspecified site: Secondary | ICD-10-CM | POA: Diagnosis not present

## 2014-12-10 DIAGNOSIS — R630 Anorexia: Secondary | ICD-10-CM | POA: Diagnosis not present

## 2014-12-10 DIAGNOSIS — R531 Weakness: Secondary | ICD-10-CM | POA: Diagnosis not present

## 2014-12-10 HISTORY — DX: Bloodstream infection due to central venous catheter, initial encounter: T80.211A

## 2014-12-10 HISTORY — DX: Acute respiratory failure with hypoxia: J96.01

## 2014-12-10 HISTORY — DX: Noninfective gastroenteritis and colitis, unspecified: K52.9

## 2014-12-10 HISTORY — DX: Calculus of kidney: N20.0

## 2014-12-10 HISTORY — DX: Obstructive and reflux uropathy, unspecified: N13.9

## 2014-12-10 HISTORY — DX: Non-ST elevation (NSTEMI) myocardial infarction: I21.4

## 2014-12-10 HISTORY — DX: Unspecified hydronephrosis: N13.30

## 2014-12-10 HISTORY — DX: Sepsis, unspecified organism: A41.9

## 2014-12-10 HISTORY — DX: Bacteremia: R78.81

## 2014-12-10 HISTORY — DX: Severe sepsis with septic shock: R65.21

## 2014-12-10 HISTORY — DX: Nonspecific elevation of levels of transaminase and lactic acid dehydrogenase (ldh): R74.0

## 2014-12-10 HISTORY — DX: Zoster without complications: B02.9

## 2014-12-10 LAB — CBC WITH DIFFERENTIAL/PLATELET
BASOS ABS: 0 10*3/uL (ref 0.0–0.1)
BASOS PCT: 0 % (ref 0–1)
EOS PCT: 1 % (ref 0–5)
Eosinophils Absolute: 0 10*3/uL (ref 0.0–0.7)
HEMATOCRIT: 33.8 % — AB (ref 39.0–52.0)
Hemoglobin: 11.3 g/dL — ABNORMAL LOW (ref 13.0–17.0)
Lymphocytes Relative: 17 % (ref 12–46)
Lymphs Abs: 1.1 10*3/uL (ref 0.7–4.0)
MCH: 35.4 pg — ABNORMAL HIGH (ref 26.0–34.0)
MCHC: 33.4 g/dL (ref 30.0–36.0)
MCV: 106 fL — AB (ref 78.0–100.0)
MONO ABS: 0.6 10*3/uL (ref 0.1–1.0)
Monocytes Relative: 9 % (ref 3–12)
Neutro Abs: 5 10*3/uL (ref 1.7–7.7)
Neutrophils Relative %: 73 % (ref 43–77)
PLATELETS: 178 10*3/uL (ref 150–400)
RBC: 3.19 MIL/uL — ABNORMAL LOW (ref 4.22–5.81)
RDW: 12.3 % (ref 11.5–15.5)
WBC: 6.8 10*3/uL (ref 4.0–10.5)

## 2014-12-10 LAB — COMPREHENSIVE METABOLIC PANEL
ALK PHOS: 119 U/L (ref 38–126)
ALT: 13 U/L — AB (ref 17–63)
AST: 13 U/L — AB (ref 15–41)
Albumin: 3.4 g/dL — ABNORMAL LOW (ref 3.5–5.0)
Anion gap: 12 (ref 5–15)
BILIRUBIN TOTAL: 0.5 mg/dL (ref 0.3–1.2)
BUN: 17 mg/dL (ref 6–20)
CO2: 20 mmol/L — AB (ref 22–32)
CREATININE: 2.79 mg/dL — AB (ref 0.61–1.24)
Calcium: 8.3 mg/dL — ABNORMAL LOW (ref 8.9–10.3)
Chloride: 107 mmol/L (ref 101–111)
GFR calc Af Amer: 33 mL/min — ABNORMAL LOW (ref >60)
GFR, EST NON AFRICAN AMERICAN: 29 mL/min — AB (ref >60)
Glucose, Bld: 84 mg/dL (ref 65–99)
Potassium: 3.6 mmol/L (ref 3.5–5.1)
SODIUM: 139 mmol/L (ref 135–145)
Total Protein: 8 g/dL (ref 6.5–8.1)

## 2014-12-10 LAB — URINALYSIS, ROUTINE W REFLEX MICROSCOPIC
Bilirubin Urine: NEGATIVE
Glucose, UA: NEGATIVE mg/dL
Ketones, ur: NEGATIVE mg/dL
Nitrite: POSITIVE — AB
Protein, ur: 30 mg/dL — AB
Specific Gravity, Urine: 1.01 (ref 1.005–1.030)
Urobilinogen, UA: 0.2 mg/dL (ref 0.0–1.0)
pH: 7.5 (ref 5.0–8.0)

## 2014-12-10 LAB — CBC
HEMATOCRIT: 41 % (ref 39.0–52.0)
HEMOGLOBIN: 14 g/dL (ref 13.0–17.0)
MCH: 35.9 pg — ABNORMAL HIGH (ref 26.0–34.0)
MCHC: 34.1 g/dL (ref 30.0–36.0)
MCV: 105.1 fL — ABNORMAL HIGH (ref 78.0–100.0)
Platelets: 185 10*3/uL (ref 150–400)
RBC: 3.9 MIL/uL — ABNORMAL LOW (ref 4.22–5.81)
RDW: 12.4 % (ref 11.5–15.5)
WBC: 8.1 10*3/uL (ref 4.0–10.5)

## 2014-12-10 LAB — DIFFERENTIAL
BASOS PCT: 0 % (ref 0–1)
Basophils Absolute: 0 10*3/uL (ref 0.0–0.1)
EOS ABS: 0.1 10*3/uL (ref 0.0–0.7)
EOS PCT: 1 % (ref 0–5)
Lymphocytes Relative: 14 % (ref 12–46)
Lymphs Abs: 1.1 10*3/uL (ref 0.7–4.0)
Monocytes Absolute: 0.5 10*3/uL (ref 0.1–1.0)
Monocytes Relative: 6 % (ref 3–12)
Neutro Abs: 6.2 10*3/uL (ref 1.7–7.7)
Neutrophils Relative %: 79 % — ABNORMAL HIGH (ref 43–77)

## 2014-12-10 LAB — HEPATIC FUNCTION PANEL
ALT: 13 U/L — ABNORMAL LOW (ref 17–63)
AST: 10 U/L — ABNORMAL LOW (ref 15–41)
Albumin: 3.8 g/dL (ref 3.5–5.0)
Alkaline Phosphatase: 138 U/L — ABNORMAL HIGH (ref 38–126)
BILIRUBIN TOTAL: 0.7 mg/dL (ref 0.3–1.2)
Total Protein: 8.9 g/dL — ABNORMAL HIGH (ref 6.5–8.1)

## 2014-12-10 LAB — PROTIME-INR
INR: 1.22 (ref 0.00–1.49)
Prothrombin Time: 15.6 seconds — ABNORMAL HIGH (ref 11.6–15.2)

## 2014-12-10 LAB — BASIC METABOLIC PANEL
ANION GAP: 11 (ref 5–15)
BUN: 20 mg/dL (ref 6–20)
CHLORIDE: 104 mmol/L (ref 101–111)
CO2: 24 mmol/L (ref 22–32)
Calcium: 9.3 mg/dL (ref 8.9–10.3)
Creatinine, Ser: 2.91 mg/dL — ABNORMAL HIGH (ref 0.61–1.24)
GFR calc Af Amer: 31 mL/min — ABNORMAL LOW (ref >60)
GFR, EST NON AFRICAN AMERICAN: 27 mL/min — AB (ref >60)
Glucose, Bld: 110 mg/dL — ABNORMAL HIGH (ref 65–99)
Potassium: 4.1 mmol/L (ref 3.5–5.1)
Sodium: 139 mmol/L (ref 135–145)

## 2014-12-10 LAB — I-STAT CG4 LACTIC ACID, ED: Lactic Acid, Venous: 3.25 mmol/L (ref 0.5–2.0)

## 2014-12-10 LAB — URINE MICROSCOPIC-ADD ON

## 2014-12-10 LAB — LACTIC ACID, PLASMA: Lactic Acid, Venous: 2.8 mmol/L (ref 0.5–2.0)

## 2014-12-10 LAB — TROPONIN I

## 2014-12-10 LAB — APTT: aPTT: 38 seconds — ABNORMAL HIGH (ref 24–37)

## 2014-12-10 LAB — RAPID STREP SCREEN (MED CTR MEBANE ONLY): STREPTOCOCCUS, GROUP A SCREEN (DIRECT): NEGATIVE

## 2014-12-10 MED ORDER — DEXTROSE 5 % IV SOLN: 2.0000 g | Freq: Once | INTRAVENOUS | Status: DC

## 2014-12-10 MED ORDER — FERROUS SULFATE 325 (65 FE) MG PO TABS
325.0000 mg | ORAL_TABLET | Freq: Every day | ORAL | Status: DC
  Administered 2014-12-11 – 2014-12-12 (×2): 325 mg via ORAL
  Filled 2014-12-10 (×3): qty 1

## 2014-12-10 MED ORDER — ABACAVIR-DOLUTEGRAVIR-LAMIVUD 600-50-300 MG PO TABS: 1.0000 | ORAL_TABLET | Freq: Every day | ORAL | Status: DC

## 2014-12-10 MED ORDER — HEPARIN SODIUM (PORCINE) 5000 UNIT/ML IJ SOLN
5000.0000 [IU] | Freq: Three times a day (TID) | INTRAMUSCULAR | Status: DC
  Administered 2014-12-10 – 2014-12-13 (×6): 5000 [IU] via SUBCUTANEOUS
  Filled 2014-12-10 (×9): qty 1

## 2014-12-10 MED ORDER — DOCUSATE SODIUM 100 MG PO CAPS
100.0000 mg | ORAL_CAPSULE | Freq: Two times a day (BID) | ORAL | Status: DC
  Administered 2014-12-10 – 2014-12-12 (×4): 100 mg via ORAL
  Filled 2014-12-10 (×6): qty 1

## 2014-12-10 MED ORDER — SODIUM CHLORIDE 0.9 % IV BOLUS (SEPSIS)
1000.0000 mL | INTRAVENOUS | Status: AC
  Administered 2014-12-10 (×2): 1000 mL via INTRAVENOUS

## 2014-12-10 MED ORDER — DEXTROSE 5 % IV SOLN
1.0000 g | Freq: Every day | INTRAVENOUS | Status: DC
  Administered 2014-12-10 – 2014-12-11 (×3): 1 g via INTRAVENOUS
  Filled 2014-12-10 (×2): qty 10

## 2014-12-10 MED ORDER — METOPROLOL TARTRATE 25 MG PO TABS
25.0000 mg | ORAL_TABLET | Freq: Two times a day (BID) | ORAL | Status: DC
  Administered 2014-12-10 – 2014-12-12 (×5): 25 mg via ORAL
  Filled 2014-12-10 (×5): qty 1

## 2014-12-10 MED ORDER — SODIUM CHLORIDE 0.9 % IV BOLUS (SEPSIS)
500.0000 mL | INTRAVENOUS | Status: AC
  Administered 2014-12-10: 500 mL via INTRAVENOUS

## 2014-12-10 MED ORDER — SODIUM CHLORIDE 0.9 % IV BOLUS (SEPSIS)
1000.0000 mL | Freq: Once | INTRAVENOUS | Status: AC
  Administered 2014-12-10: 1000 mL via INTRAVENOUS

## 2014-12-10 MED ORDER — DEXTROSE 5 % IV SOLN
1.0000 g | Freq: Once | INTRAVENOUS | Status: AC
  Administered 2014-12-10: 1 g via INTRAVENOUS
  Filled 2014-12-10: qty 10

## 2014-12-10 MED ORDER — ADULT MULTIVITAMIN W/MINERALS CH
1.0000 | ORAL_TABLET | Freq: Every day | ORAL | Status: DC
  Administered 2014-12-10 – 2014-12-12 (×3): 1 via ORAL
  Filled 2014-12-10 (×4): qty 1

## 2014-12-10 MED ORDER — TICAGRELOR 90 MG PO TABS
90.0000 mg | ORAL_TABLET | Freq: Two times a day (BID) | ORAL | Status: DC
  Administered 2014-12-10 – 2014-12-12 (×5): 90 mg via ORAL
  Filled 2014-12-10 (×6): qty 1

## 2014-12-10 MED ORDER — SODIUM CHLORIDE 0.9 % IJ SOLN
3.0000 mL | Freq: Two times a day (BID) | INTRAMUSCULAR | Status: DC
  Administered 2014-12-10 – 2014-12-11 (×2): 3 mL via INTRAVENOUS

## 2014-12-10 MED ORDER — ATORVASTATIN CALCIUM 10 MG PO TABS
10.0000 mg | ORAL_TABLET | Freq: Every day | ORAL | Status: DC
Start: 2014-12-10 — End: 2014-12-13
  Administered 2014-12-10 – 2014-12-12 (×3): 10 mg via ORAL
  Filled 2014-12-10 (×4): qty 1

## 2014-12-10 MED ORDER — VITAMIN B-1 100 MG PO TABS
100.0000 mg | ORAL_TABLET | Freq: Every day | ORAL | Status: DC
  Administered 2014-12-10 – 2014-12-12 (×3): 100 mg via ORAL
  Filled 2014-12-10 (×4): qty 1

## 2014-12-10 MED ORDER — ENSURE ENLIVE PO LIQD
237.0000 mL | Freq: Three times a day (TID) | ORAL | Status: DC
  Administered 2014-12-11 – 2014-12-12 (×6): 237 mL via ORAL

## 2014-12-10 MED ORDER — FOLIC ACID 1 MG PO TABS
1.0000 mg | ORAL_TABLET | Freq: Every day | ORAL | Status: DC
  Administered 2014-12-10 – 2014-12-12 (×3): 1 mg via ORAL
  Filled 2014-12-10 (×4): qty 1

## 2014-12-10 NOTE — ED Notes (Signed)
Pt BIB mother who states that pt hasn't been eating much over the past 2 days.  Pt denies any pain and just states that he feels tired.  Pt has extensive medical hx including HIV, cardiac arrest, cancer, and anemia w/ hx of blood transfusions.  Denies NVD.  Denies blood in stool.

## 2014-12-10 NOTE — ED Notes (Signed)
Pt back from xray at this time. Pt in NAD upon arrival back to unit.

## 2014-12-10 NOTE — ED Notes (Signed)
Notified PA of critical lab results

## 2014-12-10 NOTE — H&P (Addendum)
[facility] History and Physical  EKAM BESSON KPT:[id number] DOB: 1981/02/27 DOA: 12/10/2014  Referring physician: Jamse Mead, PA PCP: Philis Fendt, MD   Chief Complaint: Weakness  HPI: Thomas [name] is a 34 y.o. male with history of HIV CKD Hyperlipidemia h/o kidney cancer cardiac arrest in 2015 presents with weaknss to the ED. He states he has ben ill for a copu;e of days. He states he has had a poor appetite. He has noted no fevers at home but was febrile here in the ED> He states he has had no cough or congestion. He has no abdominal pain noted. He has no nausea or vomiting. He states no headaches either. He states that he has no dizziness noted. He states that he has had some blood in his urine. He states he noted the blood at the end of his stream. Patient states he has had no back pain noted. In the ED he had a UA done and this is suggestive of a UTI. Due to his nephrectomy and concern for a pyelonephritis he is being admitted   Review of Systems:  Remainder ROS is negative other than noted above in HPI  Past Medical History  Diagnosis Date  . HIV disease 06/25/2012  . Skin lesion 07/03/2012  . Leg pain 07/03/2012  . Sinus tachycardia 08/06/2012  . Penile cyst 08/06/2012  . History of blood transfusion july 2015 per pt  . 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 Chronic renal insufficiency   . Cardiac arrest 05/15/2014  . CKD (chronic kidney disease) s/p Right nephrectomy (04/30/2014) 05/19/2014   Past Surgical History  Procedure Laterality Date  . 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  this after several previous I and Ds of rectal abcess.   [name] node biopsy Right 08/08/2013    Procedure: SUPRACLAVICULAR LYMPH NODE BIOPSY;  Surgeon: Gaye Pollack, MD;  Location: [facility];  Service: Thoracic;  Laterality: Right;  . 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 Tee without cardioversion N/A  02/27/2014    Procedure: TRANSESOPHAGEAL ECHOCARDIOGRAM (TEE);  Surgeon: Pixie Casino, MD;  Location: [facility];  Service: Cardiovascular;  Laterality: N/A;  . Pac placed and later removed    . Right nephrostomy tube  for last month and 1/2  . Laparoscopic nephrectomy Right 04/30/2014    Procedure: LAPAROSCOPIC NEPHRECTOMY AND URETERECTOMY;  Surgeon: Raynelle Bring, MD;  Location: [facility];  Service: Urology;  Laterality: Right;  . Cardiac catheterization  05/19/2014  . Coronary stent placement  05/19/2014    OMI   & PROXIMAL CIRCUMFLEX  . Left heart catheterization with coronary angiogram N/A 05/18/2014    Procedure: LEFT HEART CATHETERIZATION WITH CORONARY ANGIOGRAM;  Surgeon: Laverda Page, MD;  Location: [facility] CATH LAB;  Service: Cardiovascular;  Laterality: N/A;  . Percutaneous coronary stent intervention (pci-s) N/A 05/19/2014    Procedure: PERCUTANEOUS CORONARY STENT INTERVENTION (PCI-S);  Surgeon: Laverda Page, MD;  Location: [facility] CATH LAB;  Service: Cardiovascular;  Laterality: N/A;   Social History:  reports that he quit smoking about 14 months ago. His smoking use included Cigarettes. He started smoking about 15 months ago. He has a 15 pack-year smoking history. He has never used smokeless tobacco. He reports that he does not drink alcohol or use illicit drugs.  Allergies  Allergen Reactions  . Tylenol [Acetaminophen] Other (See Comments)    sweats    History  reviewed. No pertinent family history.   Prior to Admission medications   Medication Sig Start Date End Date Taking? Authorizing Provider  Abacavir-Dolutegravir-Lamivud 109-32-355 MG TABS Take 1 tablet by mouth daily.    Yes Historical Provider, MD  atorvastatin (LIPITOR) 10 MG tablet Take 10 mg by mouth daily.  11/26/14  Yes Historical Provider, MD  docusate sodium (COLACE) 100 MG capsule Take 1 capsule (100 mg total) by mouth 2 (two) times daily. 04/30/14  Yes Raynelle Bring, MD  feeding supplement, ENSURE COMPLETE,  (ENSURE COMPLETE) LIQD Take 237 mLs by mouth 3 (three) times daily between meals. 07/27/14  Yes [name], MD  ferrous sulfate 325 (65 FE) MG tablet TAKE 1 TABLET (325 MG TOTAL) BY MOUTH TWO   (TWO) TIMES DAILY WITH A MEAL. 11/17/14  Yes [name], MD  metoprolol tartrate (LOPRESSOR) 25 MG tablet Take 1 tablet (25 mg total) by mouth 2 (two) times daily. 05/21/14  Yes Annita Brod, MD  ticagrelor (BRILINTA) 90 MG TABS tablet Take 1 tablet (90 mg total) by mouth 2 (two) times daily. 05/21/14  Yes Annita Brod, MD  atorvastatin (LIPITOR) 80 MG tablet Take 1 tablet (80 mg total) by mouth daily at 6 PM. Patient not taking: Reported on 12/10/2014 05/21/14   Annita Brod, MD  oxyCODONE (ROXICODONE) 5 MG immediate release tablet Take 1 tablet (5 mg total) by mouth every 4 (four) hours as needed for severe pain. Patient not taking: Reported on 12/10/2014 04/30/14   Raynelle Bring, MD  silver sulfADIAZINE (SILVADENE) 1 % cream Apply topically 2 (two) times daily. Patient not taking: Reported on 12/10/2014 05/21/14   Annita Brod, MD   Physical Exam: Filed Vitals:   12/10/14 1620 12/10/14 1753 12/10/14 1925  BP: 136/71 104/66 106/61  Pulse: 96 86 80  Temp: 100.2 F (37.9 C) 99 F (37.2 C) 99.6 F (37.6 C)  TempSrc: Oral Oral Oral  Resp: 18 17 15   SpO2: 100% 100% 100%    Wt Readings from Last 3 Encounters:  10/09/14 77.111 kg (170 lb)  05/20/14 68 kg (149 lb 14.6 oz)  04/30/14 67.671 kg (149 lb 3 oz)    General:  Appears calm and comfortable Eyes: PERRL, normal lids, irises & conjunctiva ENT: grossly normal hearing, lips & tongue Neck: no LAD, masses or thyromegaly Cardiovascular: RRR, no m/r/g. No LE edema. Respiratory: CTA bilaterally, no w/r/r. Normal respiratory effort. Abdomen: soft, ntnd Skin: no rash or induration seen on limited exam Musculoskeletal: grossly normal tone BUE/BLE Psychiatric: grossly normal mood and affect Neurologic: grossly non-focal.            Labs on Admission:  Basic Metabolic Panel:  Recent Labs Lab 12/10/14 1657  NA 139  K 4.1  CL 104  CO2 24  GLUCOSE 110*  BUN 20  CREATININE 2.91*  CALCIUM 9.3   Liver Function Tests:  Recent Labs Lab 12/10/14 1724  AST 10*  ALT 13*  ALKPHOS 138*  BILITOT 0.7  PROT 8.9*  ALBUMIN 3.8   No results for input(s): LIPASE, AMYLASE in the last 168 hours. No results for input(s): AMMONIA in the last 168 hours. CBC:  Recent Labs Lab 12/10/14 1657  WBC 8.1  NEUTROABS 6.2  HGB 14.0  HCT 41.0  MCV 105.1*  PLT 185   Cardiac Enzymes:  Recent Labs Lab 12/10/14 1724  TROPONINI <0.03    BNP (last 3 results) No results for input(s): BNP in the last 8760 hours.  ProBNP (  last 3 results) No results for input(s): PROBNP in the last 8760 hours.  CBG: No results for input(s): GLUCAP in the last 168 hours.  Radiological Exams on Admission: Dg Chest 2 View  12/10/2014   CLINICAL DATA:  Lack of appetite excessive tiredness for 2 days, history of sinus tachycardia, coronary artery disease, prior cardiac arrest, former smoker, HIV, chronic renal insufficiency  EXAM: CHEST  2 VIEW  COMPARISON:  05/16/2014  FINDINGS: Normal heart size, mediastinal contours, and pulmonary vascularity.  Minimal peribronchial thickening and hyperinflation.  No acute infiltrate, pleural effusion or pneumothorax.  Osseous structures unremarkable.  IMPRESSION: Minimal peribronchial thickening and hyperinflation, question due to bronchitis or asthma.  No acute infiltrate.   Electronically Signed   By: Lavonia Dana M.D.   On: 12/10/2014 18:37     Assessment/Plan Active Problems:   HIV disease   CKD (chronic kidney disease) s/p Right nephrectomy (04/30/2014)   UTI (lower urinary tract infection)   UTI (urinary tract infection)   Hyperlipidemia   1. UTI with SIRS -will be admitted for IV antibiotics started on rocephin presently -UA done will await cultures -need to get blood cultures -would  get ultrasound of the kidney to r/o obstruction -will hydrate aggressively now -will monitor labs  2. CKD s/p Right nephrectomy -monitor labs -will treat UTI as noted -will get Renal US  3. HIV disease -check viral load -will check CD4 count -continue retrovirals  4. Hyperlipidemia -on statins   Code Status: Full Code (must indicate code status--if unknown or must be presumed, indicate so) DVT Prophylaxis:heparin Family Communication: None (indicate person spoken with, if applicable, with phone number if by telephone) Disposition Plan: Home (indicate anticipated LOS)  Time spent: 1min  Aide Wojnar A [facility] Pager [phone]

## 2014-12-10 NOTE — ED Provider Notes (Signed)
CSN: [id number]     Arrival date & time 12/10/14  1534 History   First MD Initiated Contact with Patient 12/10/14 1708     Chief Complaint  Patient presents with  . Fatigue     (Consider location/radiation/quality/duration/timing/severity/associated sxs/prior Treatment) The history is provided by the patient. No language interpreter was used.  Thomas [name] is a 34 y/o M with PMHx of HIV, chronic kidney disease, kidney cancer with right nephrectomy performed in October 2015, cardiac arrest in October 2015 with history of 2 stent placements presenting to the ED with fatigue and generalized weakness. Patient reported that for the past couple of days - reported that he has been having decreased appetite and noticed weight loss. Patient reported that he feels extremely tired and light headed at time. Reported that he noticed hematuria approximately 3 days ago, only once and has never happened again. Denied chest pain, shortness of breath, difficulty breathing, fever, cough, nasal congestion, abdominal pain, nausea, vomiting, diarrhea, melena, hematochezia, urinary symptoms, decreased urine, blurred vision, sudden loss of vision, dysuria, neck pain, neck stiffness, back pain, fainting, leg pain, headache, dizziness. PCP Dr. Warren Danes ID Dr. Linus Salmons  Past Medical History  Diagnosis Date  . HIV disease 06/25/2012  . Skin lesion 07/03/2012  . Leg pain 07/03/2012  . Sinus tachycardia 08/06/2012  . Penile cyst 08/06/2012  . History of blood transfusion july 2015 per pt  . 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 Chronic renal insufficiency   . Cardiac arrest 05/15/2014  . CKD (chronic kidney disease) s/p Right nephrectomy (04/30/2014) 05/19/2014   Past Surgical History  Procedure Laterality Date  . 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  this after several previous I and Ds of rectal abcess.   [name] node biopsy Right  08/08/2013    Procedure: SUPRACLAVICULAR LYMPH NODE BIOPSY;  Surgeon: Gaye Pollack, MD;  Location: [facility];  Service: Thoracic;  Laterality: Right;  . 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 Tee without cardioversion N/A 02/27/2014    Procedure: TRANSESOPHAGEAL ECHOCARDIOGRAM (TEE);  Surgeon: Pixie Casino, MD;  Location: [place];  Service: Cardiovascular;  Laterality: N/A;  . Pac placed and later removed    . Right nephrostomy tube  for last month and 1/2  . Laparoscopic nephrectomy Right 04/30/2014    Procedure: LAPAROSCOPIC NEPHRECTOMY AND URETERECTOMY;  Surgeon: Raynelle Bring, MD;  Location: [facility];  Service: Urology;  Laterality: Right;  . Cardiac catheterization  05/19/2014  . Coronary stent placement  05/19/2014    OMI   & PROXIMAL CIRCUMFLEX  . Left heart catheterization with coronary angiogram N/A 05/18/2014    Procedure: LEFT HEART CATHETERIZATION WITH CORONARY ANGIOGRAM;  Surgeon: Laverda Page, MD;  Location: [facility] CATH LAB;  Service: Cardiovascular;  Laterality: N/A;  . Percutaneous coronary stent intervention (pci-s) N/A 05/19/2014    Procedure: PERCUTANEOUS CORONARY STENT INTERVENTION (PCI-S);  Surgeon: Laverda Page, MD;  Location: [facility] CATH LAB;  Service: Cardiovascular;  Laterality: N/A;   History reviewed. No pertinent family history. History  Substance Use Topics  . Smoking status: Former Smoker -- 1.00 packs/day for 15 years    Types: Cigarettes    Start date: 08/24/2013    Quit date: 09/21/2013  . Smokeless tobacco: Never Used     Comment: quitline info, counseling  . Alcohol Use: No    Review of Systems  Constitutional: Positive for chills. Negative  for fever.  Eyes: Negative for visual disturbance.  Respiratory: Negative for cough, chest tightness and shortness of breath.   Cardiovascular: Negative for chest pain.  Gastrointestinal: Negative for nausea, vomiting, abdominal pain, diarrhea, constipation, blood in stool and anal bleeding.  Genitourinary:  Positive for hematuria. Negative for dysuria.  Musculoskeletal: Negative for back pain, neck pain and neck stiffness.  Neurological: Positive for weakness.      Allergies  Tylenol  Home Medications   Prior to Admission medications   Medication Sig Start Date End Date Taking? Authorizing Provider  Abacavir-Dolutegravir-Lamivud [phone] MG TABS Take 1 tablet by mouth daily.    Yes Historical Provider, MD  atorvastatin (LIPITOR) 10 MG tablet Take 10 mg by mouth daily.  11/26/14  Yes Historical Provider, MD  docusate sodium (COLACE) 100 MG capsule Take 1 capsule (100 mg total) by mouth 2 (two) times daily. 04/30/14  Yes Raynelle Bring, MD  feeding supplement, ENSURE COMPLETE, (ENSURE COMPLETE) LIQD Take 237 mLs by mouth 3 (three) times daily between meals. 07/27/14  Yes [name], MD  ferrous sulfate 325 (65 FE) MG tablet TAKE 1 TABLET (325 MG TOTAL) BY MOUTH TWO   (TWO) TIMES DAILY WITH A MEAL. 11/17/14  Yes [name], MD  metoprolol tartrate (LOPRESSOR) 25 MG tablet Take 1 tablet (25 mg total) by mouth 2 (two) times daily. 05/21/14  Yes Annita Brod, MD  ticagrelor (BRILINTA) 90 MG TABS tablet Take 1 tablet (90 mg total) by mouth 2 (two) times daily. 05/21/14  Yes Annita Brod, MD  atorvastatin (LIPITOR) 80 MG tablet Take 1 tablet (80 mg total) by mouth daily at 6 PM. Patient not taking: Reported on 12/10/2014 05/21/14   Annita Brod, MD  oxyCODONE (ROXICODONE) 5 MG immediate release tablet Take 1 tablet (5 mg total) by mouth every 4 (four) hours as needed for severe pain. Patient not taking: Reported on 12/10/2014 04/30/14   Raynelle Bring, MD  silver sulfADIAZINE (SILVADENE) 1 % cream Apply topically 2 (two) times daily. Patient not taking: Reported on 12/10/2014 05/21/14   Annita Brod, MD   BP 106/61 mmHg  Pulse 80  Temp(Src) 99.6 F (37.6 C) (Oral)  Resp 15  SpO2 100% Physical Exam  Constitutional: He is oriented to person, place, and time. He appears  well-developed and well-nourished. No distress.  HENT:  Head: Normocephalic and atraumatic.  Mouth/Throat: Oropharynx is clear and moist. No oropharyngeal exudate.  Eyes: Conjunctivae and EOM are normal. Pupils are equal, round, and reactive to light. Right eye exhibits no discharge. Left eye exhibits no discharge.  Neck: Normal range of motion. Neck supple. No tracheal deviation present.  Negative neck stiffness Negative nuchal rigidity Negative cervical adenopathy Negative meningeal signs  Cardiovascular: Normal rate, regular rhythm and normal heart sounds.   Pulses:      Radial pulses are 2+ on the right side, and 2+ on the left side.       Dorsalis pedis pulses are 2+ on the right side, and 2+ on the left side.  Pulmonary/Chest: Effort normal and breath sounds normal. No respiratory distress. He has no wheezes. He has no rales. He exhibits no tenderness.  Abdominal: Soft. Bowel sounds are normal. He exhibits no distension. There is no tenderness. There is no rebound and no guarding.  Musculoskeletal: Normal range of motion.  Full ROM to upper and lower extremities without difficulty noted, negative ataxia noted.  Lymphadenopathy:    He has no cervical adenopathy.  Neurological: He  is alert and oriented to person, place, and time. No cranial nerve deficit. He exhibits normal muscle tone. Coordination normal. GCS eye subscore is 4. GCS verbal subscore is 5. GCS motor subscore is 6.  Skin: Skin is warm and dry. No rash noted. He is not diaphoretic. No erythema.  Psychiatric: He has a normal mood and affect. His behavior is normal. Thought content normal.  Nursing note and vitals reviewed.   ED Course  Procedures (including critical care time)  Results for orders placed or performed during the hospital encounter of 12/10/14  Rapid strep screen  Result Value Ref Range   Streptococcus, Group A Screen (Direct) NEGATIVE NEGATIVE  CBC  Result Value Ref Range   WBC 8.1 4.0 - 10.5 K/uL    RBC 3.90 (L) 4.22 - 5.81 MIL/uL   Hemoglobin 14.0 13.0 - 17.0 g/dL   HCT 41.0 39.0 - 52.0 %   MCV 105.1 (H) 78.0 - 100.0 fL   MCH 35.9 (H) 26.0 - 34.0 pg   MCHC 34.1 30.0 - 36.0 g/dL   RDW 12.4 11.5 - 15.5 %   Platelets 185 150 - 400 K/uL  Basic metabolic panel  Result Value Ref Range   Sodium 139 135 - 145 mmol/L   Potassium 4.1 3.5 - 5.1 mmol/L   Chloride 104 101 - 111 mmol/L   CO2 24 22 - 32 mmol/L   Glucose, Bld 110 (H) 65 - 99 mg/dL   BUN 20 6 - 20 mg/dL   Creatinine, Ser 2.91 (H) 0.61 - 1.24 mg/dL   Calcium 9.3 8.9 - 10.3 mg/dL   GFR calc non Af Amer 27 (L) >60 mL/min   GFR calc Af Amer 31 (L) >60 mL/min   Anion gap 11 5 - 15  Hepatic function panel  Result Value Ref Range   Total Protein 8.9 (H) 6.5 - 8.1 g/dL   Albumin 3.8 3.5 - 5.0 g/dL   AST 10 (L) 15 - 41 U/L   ALT 13 (L) 17 - 63 U/L   Alkaline Phosphatase 138 (H) 38 - 126 U/L   Total Bilirubin 0.7 0.3 - 1.2 mg/dL   Bilirubin, Direct <0.1 (L) 0.1 - 0.5 mg/dL   Indirect Bilirubin NOT CALCULATED 0.3 - 0.9 mg/dL  Urinalysis, Routine w reflex microscopic  Result Value Ref Range   Color, Urine YELLOW YELLOW   APPearance TURBID (A) CLEAR   Specific Gravity, Urine 1.010 1.005 - 1.030   pH 7.5 5.0 - 8.0   Glucose, UA NEGATIVE NEGATIVE mg/dL   Hgb urine dipstick MODERATE (A) NEGATIVE   Bilirubin Urine NEGATIVE NEGATIVE   Ketones, ur NEGATIVE NEGATIVE mg/dL   Protein, ur 30 (A) NEGATIVE mg/dL   Urobilinogen, UA 0.2 0.0 - 1.0 mg/dL   Nitrite POSITIVE (A) NEGATIVE   Leukocytes, UA LARGE (A) NEGATIVE  Differential  Result Value Ref Range   Neutrophils Relative % 79 (H) 43 - 77 %   Neutro Abs 6.2 1.7 - 7.7 K/uL   Lymphocytes Relative 14 12 - 46 %   Lymphs Abs 1.1 0.7 - 4.0 K/uL   Monocytes Relative 6 3 - 12 %   Monocytes Absolute 0.5 0.1 - 1.0 K/uL   Eosinophils Relative 1 0 - 5 %   Eosinophils Absolute 0.1 0.0 - 0.7 K/uL   Basophils Relative 0 0 - 1 %   Basophils Absolute 0.0 0.0 - 0.1 K/uL  Troponin I  Result  Value Ref Range   Troponin I <0.03 <0.031 ng/mL  Urine microscopic-add on  Result Value Ref Range   WBC, UA TOO NUMEROUS TO COUNT <3 WBC/hpf   RBC / HPF 3-6 <3 RBC/hpf   Bacteria, UA FEW (A) RARE  I-Stat CG4 Lactic Acid, ED  Result Value Ref Range   Lactic Acid, Venous 3.25 (HH) 0.5 - 2.0 mmol/L   Comment NOTIFIED PHYSICIAN     Labs Review Labs Reviewed  CBC - Abnormal; Notable for the following:    RBC 3.90 (*)    MCV 105.1 (*)    MCH 35.9 (*)    All other components within normal limits  BASIC METABOLIC PANEL - Abnormal; Notable for the following:    Glucose, Bld 110 (*)    Creatinine, Ser 2.91 (*)    GFR calc non Af Amer 27 (*)    GFR calc Af Amer 31 (*)    All other components within normal limits  HEPATIC FUNCTION PANEL - Abnormal; Notable for the following:    Total Protein 8.9 (*)    AST 10 (*)    ALT 13 (*)    Alkaline Phosphatase 138 (*)    Bilirubin, Direct <0.1 (*)    All other components within normal limits  URINALYSIS, ROUTINE W REFLEX MICROSCOPIC - Abnormal; Notable for the following:    APPearance TURBID (*)    Hgb urine dipstick MODERATE (*)    Protein, ur 30 (*)    Nitrite POSITIVE (*)    Leukocytes, UA LARGE (*)    All other components within normal limits  DIFFERENTIAL - Abnormal; Notable for the following:    Neutrophils Relative % 79 (*)    All other components within normal limits  URINE MICROSCOPIC-ADD ON - Abnormal; Notable for the following:    Bacteria, UA FEW (*)    All other components within normal limits  I-STAT CG4 LACTIC ACID, ED - Abnormal; Notable for the following:    Lactic Acid, Venous 3.25 (*)    All other components within normal limits  RAPID STREP SCREEN  URINE CULTURE  CULTURE, GROUP A STREP  TROPONIN I  CBC WITH DIFFERENTIAL/PLATELET  I-STAT CG4 LACTIC ACID, ED    Imaging Review Dg Chest 2 View  12/10/2014   CLINICAL DATA:  Lack of appetite excessive tiredness for 2 days, history of sinus tachycardia, coronary  artery disease, prior cardiac arrest, former smoker, HIV, chronic renal insufficiency  EXAM: CHEST  2 VIEW  COMPARISON:  05/16/2014  FINDINGS: Normal heart size, mediastinal contours, and pulmonary vascularity.  Minimal peribronchial thickening and hyperinflation.  No acute infiltrate, pleural effusion or pneumothorax.  Osseous structures unremarkable.  IMPRESSION: Minimal peribronchial thickening and hyperinflation, question due to bronchitis or asthma.  No acute infiltrate.   Electronically Signed   By: Lavonia Dana M.D.   On: 12/10/2014 18:37     EKG Interpretation None      8:21 PM This provider spoke with Dr. Humphrey Rolls, [facility]. Discussed case, labs, imaging, ED course in great detail. Patient to be admitted.   MDM   Final diagnoses:  Pyelonephritis  Elevated lactic acid level  Chronic kidney disease, unspecified stage  HIV (human immunodeficiency virus infection)    Medications  cefTRIAXone (ROCEPHIN) 1 g in dextrose 5 % 50 mL IVPB (not administered)  sodium chloride 0.9 % bolus 1,000 mL (not administered)  sodium chloride 0.9 % bolus 1,000 mL (1,000 mLs Intravenous New Bag/Given 12/10/14 1805)    Filed Vitals:   12/10/14 1620 12/10/14 1753 12/10/14 1925  BP: 136/71 104/66  106/61  Pulse: 96 86 80  Temp: 100.2 F (37.9 C) 99 F (37.2 C) 99.6 F (37.6 C)  TempSrc: Oral Oral Oral  Resp: 18 17 15   SpO2: 100% 100% 100%    EKG noted normal sinus rhythm with heart rate of 81 bpm with early repolarization. Troponin negative elevation. CBC negative elevated leukocytosis - neutrophils elevated at 79. Hemoglobin 14.0, hematocrit 41.0. BMP noted elevated creatinine of 2.91, when compared to 2 months ago patient's creatinine was 3.0-patient has history of chronic kidney disease. Hepatic function panel noted elevated alkaline phosphatase of 138, when compared to previous levels have been elevated in the past as high as 199. Lactic acid elevated at 3.25. Rapid strep negative.  Urinalysis noted moderate hemoglobin with positive nitrites with large leukocytes with white blood cells too numerous to count. Urine culture pending. Chest x-ray noted mild peribronchial thickening and hyperinflation question due to bronchitis or asthma, no findings of acute infiltrate. Patient presenting to the ED with increased weakness, weight loss. Urinated tract infection identified-urine culture pending. Patient started on IV fluids and IV antibiotics. Elevated lactic acid. Mild low-grade fever 100.22F identified while in ED setting. Based on patient's presentation, significant comorbidities will admit - attending physician is in accordance to plan of care. Discussed plan for admission for IV antibiotics and IV hydration with patient who is at accordance to plan of care. Patient to be admitted to [facility]. Patient stable for transfer to floor.  Jamse Mead, PA-C 12/10/14 2030  Jamse Mead, PA-C 12/10/14 2030  Veryl Speak, MD 12/10/14 2322

## 2014-12-10 NOTE — ED Notes (Signed)
Pt to xray at this time. Pt in NAD upon departure from unit

## 2014-12-10 NOTE — Progress Notes (Addendum)
ANTIBIOTIC CONSULT NOTE - INITIAL  Pharmacy Consult for Ceftriaxone Indication: urosepsis  Allergies  Allergen Reactions  . Tylenol [Acetaminophen] Other (See Comments)    sweats    Patient Measurements: Height: 6\' 1"  (185.4 cm) Weight: 160 lb (72.576 kg) IBW/kg (Calculated) : 79.9  Vital Signs: Temp: 100.4 F (38 C) (05/19 2122) Temp Source: Oral (05/19 2122) BP: 107/67 mmHg (05/19 2122) Pulse Rate: 98 (05/19 2122) Intake/Output from previous day:   Intake/Output from this shift:    Labs:  Recent Labs  12/10/14 1657  WBC 8.1  HGB 14.0  PLT 185  CREATININE 2.91*   Estimated Creatinine Clearance: 37.8 mL/min (by C-G formula based on Cr of 2.91). No results for input(s): VANCOTROUGH, VANCOPEAK, VANCORANDOM, GENTTROUGH, GENTPEAK, GENTRANDOM, TOBRATROUGH, TOBRAPEAK, TOBRARND, AMIKACINPEAK, AMIKACINTROU, AMIKACIN in the last 72 hours.   Microbiology: Recent Results (from the past 720 hour(s))  Rapid strep screen     Status: None   Collection Time: 12/10/14  7:12 PM  Result Value Ref Range Status   Streptococcus, Group A Screen (Direct) NEGATIVE NEGATIVE Final    Comment: (NOTE) A Rapid Antigen test may result negative if the antigen level in the sample is below the detection level of this test. The FDA has not cleared this test as a stand-alone test therefore the rapid antigen negative result has reflexed to a Group A Strep culture.     Medical History: Past Medical History  Diagnosis Date  . HIV disease 06/25/2012  . Skin lesion 07/03/2012  . Leg pain 07/03/2012  . Sinus tachycardia 08/06/2012  . Penile cyst 08/06/2012  . History of blood transfusion july 2015 per pt  . 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 Chronic renal insufficiency   . Cardiac arrest 05/15/2014  . CKD (chronic kidney disease) s/p Right nephrectomy (04/30/2014) 05/19/2014    Medications:  Scheduled:  . Abacavir-Dolutegravir-Lamivud  1 tablet Oral Daily  . atorvastatin  10  mg Oral Daily  . docusate sodium  100 mg Oral BID  . feeding supplement (ENSURE COMPLETE)  237 mL Oral TID BM  . [START ON 12/11/2014] ferrous sulfate  325 mg Oral Q breakfast  . folic acid  1 mg Oral Daily  . heparin  5,000 Units Subcutaneous 3 times per day  . metoprolol tartrate  25 mg Oral BID  . multivitamin with minerals  1 tablet Oral Daily  . sodium chloride  3 mL Intravenous Q12H  . thiamine  100 mg Oral Daily  . ticagrelor  90 mg Oral BID   Infusions:  . cefTRIAXone (ROCEPHIN)  IV    . sodium chloride     Followed by  . sodium chloride     Assessment: 34 y.o. male admitted 12/10/2014 for urosepsis.  PMH HIV, CKD, HLD.  To begin ceftriaxone per pharmacy.  Already received 1 g ceftriaxone in ED.   Goal of Therapy:  Eradication of infection Appropriate antibiotic dosing for indication and renal function  Plan:  Day 1 antibiotics  Ceftriaxone 1g IV q24.  MD ordered 2g x 1 after 1 g already given, so assuming the intent was to provide a 2g loading dose in this patient per the sepsis order set, I replaced the 2g order with a second 1g order to give 2g total tonight.  Follow clinical course, renal function, culture results as available  Follow for de-escalation of antibiotics and LOT  Pharmacy to manage peripherally   Reuel Boom, PharmD Pager: [phone] 12/10/2014, 10:13 PM

## 2014-12-10 NOTE — ED Notes (Addendum)
Pt presents to ED b/c mother was worried about pt appetite. Pt states that he has not been that hungry lately and thinks it is b/c he has had a "little cold" Pt also reports being tired. Pt denies any chest pain, abd pain, SOB, dizziness, weakness, N/V, diarrhea. Pt currently resting in bed in NAD

## 2014-12-10 NOTE — Progress Notes (Signed)
CRITICAL VALUE ALERT  Critical value received:  Lactic Acid 2.8  Date of notification:  12/10/14  Time of notification: 2349  Critical value read back:yes  Nurse who received alert:  Dellie Catholic  MD notified (1st page):yes  Time MD notified : 2350

## 2014-12-11 ENCOUNTER — Encounter (HOSPITAL_COMMUNITY): Payer: Self-pay | Admitting: Certified Registered Nurse Anesthetist

## 2014-12-11 ENCOUNTER — Inpatient Hospital Stay (HOSPITAL_COMMUNITY): Payer: Medicare Other | Admitting: Certified Registered Nurse Anesthetist

## 2014-12-11 ENCOUNTER — Encounter (HOSPITAL_COMMUNITY)
Admission: EM | Disposition: A | Discharge status: Home / Self Care | Payer: Self-pay | Source: Home / Self Care | Attending: Internal Medicine

## 2014-12-11 ENCOUNTER — Inpatient Hospital Stay (HOSPITAL_COMMUNITY): Payer: Medicare Other

## 2014-12-11 DIAGNOSIS — N189 Chronic kidney disease, unspecified: Secondary | ICD-10-CM

## 2014-12-11 DIAGNOSIS — N179 Acute kidney failure, unspecified: Secondary | ICD-10-CM | POA: Diagnosis present

## 2014-12-11 DIAGNOSIS — B20 Human immunodeficiency virus [HIV] disease: Secondary | ICD-10-CM

## 2014-12-11 HISTORY — PX: HOLMIUM LASER APPLICATION: SHX5852

## 2014-12-11 HISTORY — PX: CYSTOSCOPY WITH RETROGRADE PYELOGRAM, URETEROSCOPY AND STENT PLACEMENT: SHX5789

## 2014-12-11 LAB — COMPREHENSIVE METABOLIC PANEL
ALBUMIN: 2.6 g/dL — AB (ref 3.5–5.0)
ALK PHOS: 105 U/L (ref 38–126)
ALT: 10 U/L — AB (ref 17–63)
ANION GAP: 9 (ref 5–15)
AST: 9 U/L — ABNORMAL LOW (ref 15–41)
BUN: 16 mg/dL (ref 6–20)
CO2: 18 mmol/L — ABNORMAL LOW (ref 22–32)
Calcium: 7.8 mg/dL — ABNORMAL LOW (ref 8.9–10.3)
Chloride: 111 mmol/L (ref 101–111)
Creatinine, Ser: 2.6 mg/dL — ABNORMAL HIGH (ref 0.61–1.24)
GFR calc Af Amer: 36 mL/min — ABNORMAL LOW (ref >60)
GFR calc non Af Amer: 31 mL/min — ABNORMAL LOW (ref >60)
Glucose, Bld: 96 mg/dL (ref 65–99)
Potassium: 3.1 mmol/L — ABNORMAL LOW (ref 3.5–5.1)
Sodium: 138 mmol/L (ref 135–145)
TOTAL PROTEIN: 6.6 g/dL (ref 6.5–8.1)
Total Bilirubin: 0.4 mg/dL (ref 0.3–1.2)

## 2014-12-11 LAB — LACTIC ACID, PLASMA: LACTIC ACID, VENOUS: 0.8 mmol/L (ref 0.5–2.0)

## 2014-12-11 LAB — CBC
HEMATOCRIT: 32.1 % — AB (ref 39.0–52.0)
HEMOGLOBIN: 10.7 g/dL — AB (ref 13.0–17.0)
MCH: 35 pg — ABNORMAL HIGH (ref 26.0–34.0)
MCHC: 33.3 g/dL (ref 30.0–36.0)
MCV: 104.9 fL — AB (ref 78.0–100.0)
Platelets: 151 10*3/uL (ref 150–400)
RBC: 3.06 MIL/uL — ABNORMAL LOW (ref 4.22–5.81)
RDW: 12.5 % (ref 11.5–15.5)
WBC: 5.5 10*3/uL (ref 4.0–10.5)

## 2014-12-11 LAB — T-HELPER CELLS (CD4) COUNT (NOT AT ARMC)
CD4 T CELL HELPER: 28 % — AB (ref 33–55)
CD4 T Cell Abs: 300 /uL — ABNORMAL LOW (ref 400–2700)

## 2014-12-11 LAB — TSH: TSH: 1.437 u[IU]/mL (ref 0.350–4.500)

## 2014-12-11 LAB — GLUCOSE, CAPILLARY: Glucose-Capillary: 92 mg/dL (ref 65–99)

## 2014-12-11 LAB — PROCALCITONIN: Procalcitonin: 10.37 ng/mL

## 2014-12-11 LAB — MRSA PCR SCREENING: MRSA BY PCR: NEGATIVE

## 2014-12-11 SURGERY — CYSTOURETEROSCOPY, WITH RETROGRADE PYELOGRAM AND STENT INSERTION
Anesthesia: General | Laterality: Left

## 2014-12-11 MED ORDER — PROPOFOL 10 MG/ML IV BOLUS
INTRAVENOUS | Status: AC
  Filled 2014-12-11: qty 20

## 2014-12-11 MED ORDER — PHENYLEPHRINE 40 MCG/ML (10ML) SYRINGE FOR IV PUSH (FOR BLOOD PRESSURE SUPPORT)
PREFILLED_SYRINGE | INTRAVENOUS | Status: AC
  Filled 2014-12-11: qty 10

## 2014-12-11 MED ORDER — ONDANSETRON HCL 4 MG/2ML IJ SOLN
INTRAMUSCULAR | Status: AC
  Filled 2014-12-11: qty 2

## 2014-12-11 MED ORDER — LACTATED RINGERS IV SOLN
INTRAVENOUS | Status: DC
  Administered 2014-12-11 – 2014-12-12 (×3): via INTRAVENOUS

## 2014-12-11 MED ORDER — FENTANYL CITRATE (PF) 250 MCG/5ML IJ SOLN
INTRAMUSCULAR | Status: AC
  Filled 2014-12-11: qty 5

## 2014-12-11 MED ORDER — PHENYLEPHRINE 40 MCG/ML (10ML) SYRINGE FOR IV PUSH (FOR BLOOD PRESSURE SUPPORT)
PREFILLED_SYRINGE | INTRAVENOUS | Status: AC
  Filled 2014-12-11: qty 20

## 2014-12-11 MED ORDER — FENTANYL CITRATE (PF) 100 MCG/2ML IJ SOLN
INTRAMUSCULAR | Status: DC | PRN
  Administered 2014-12-11 (×3): 50 ug via INTRAVENOUS

## 2014-12-11 MED ORDER — IOHEXOL 300 MG/ML  SOLN
INTRAMUSCULAR | Status: DC | PRN
  Administered 2014-12-11: 10 mL via URETHRAL

## 2014-12-11 MED ORDER — CEFTRIAXONE SODIUM IN DEXTROSE 20 MG/ML IV SOLN
1.0000 g | INTRAVENOUS | Status: DC
  Filled 2014-12-11: qty 50

## 2014-12-11 MED ORDER — PHENYLEPHRINE HCL 10 MG/ML IJ SOLN
INTRAMUSCULAR | Status: DC | PRN
  Administered 2014-12-11 (×6): 80 ug via INTRAVENOUS

## 2014-12-11 MED ORDER — POTASSIUM CHLORIDE CRYS ER 20 MEQ PO TBCR
40.0000 meq | EXTENDED_RELEASE_TABLET | Freq: Once | ORAL | Status: AC
  Administered 2014-12-11: 40 meq via ORAL
  Filled 2014-12-11: qty 2

## 2014-12-11 MED ORDER — EPHEDRINE SULFATE 50 MG/ML IJ SOLN
INTRAMUSCULAR | Status: AC
  Filled 2014-12-11: qty 1

## 2014-12-11 MED ORDER — MIDAZOLAM HCL 2 MG/2ML IJ SOLN
INTRAMUSCULAR | Status: AC
  Filled 2014-12-11: qty 2

## 2014-12-11 MED ORDER — LAMIVUDINE 150 MG PO TABS
150.0000 mg | ORAL_TABLET | Freq: Every day | ORAL | Status: DC
  Administered 2014-12-11 – 2014-12-13 (×3): 150 mg via ORAL
  Filled 2014-12-11 (×3): qty 1

## 2014-12-11 MED ORDER — PROPOFOL 10 MG/ML IV BOLUS
INTRAVENOUS | Status: DC | PRN
  Administered 2014-12-11: 160 mg via INTRAVENOUS

## 2014-12-11 MED ORDER — SODIUM CHLORIDE 0.9 % IJ SOLN
INTRAMUSCULAR | Status: AC
  Filled 2014-12-11: qty 10

## 2014-12-11 MED ORDER — MIDAZOLAM HCL 5 MG/5ML IJ SOLN
INTRAMUSCULAR | Status: DC | PRN
  Administered 2014-12-11: 2 mg via INTRAVENOUS

## 2014-12-11 MED ORDER — DOLUTEGRAVIR SODIUM 50 MG PO TABS
50.0000 mg | ORAL_TABLET | Freq: Every day | ORAL | Status: DC
  Administered 2014-12-11 – 2014-12-13 (×3): 50 mg via ORAL
  Filled 2014-12-11 (×3): qty 1

## 2014-12-11 MED ORDER — ONDANSETRON HCL 4 MG/2ML IJ SOLN
INTRAMUSCULAR | Status: DC | PRN
  Administered 2014-12-11: 4 mg via INTRAVENOUS

## 2014-12-11 MED ORDER — SUCCINYLCHOLINE CHLORIDE 20 MG/ML IJ SOLN
INTRAMUSCULAR | Status: DC | PRN
  Administered 2014-12-11: 100 mg via INTRAVENOUS

## 2014-12-11 MED ORDER — DEXAMETHASONE SODIUM PHOSPHATE 10 MG/ML IJ SOLN
INTRAMUSCULAR | Status: DC | PRN
  Administered 2014-12-11: 5 mg via INTRAVENOUS

## 2014-12-11 MED ORDER — LIDOCAINE HCL (CARDIAC) 20 MG/ML IV SOLN
INTRAVENOUS | Status: AC
  Filled 2014-12-11: qty 5

## 2014-12-11 MED ORDER — SODIUM CHLORIDE 0.9 % IR SOLN
Status: DC | PRN
  Administered 2014-12-11: 5000 mL

## 2014-12-11 MED ORDER — LACTATED RINGERS IV SOLN
INTRAVENOUS | Status: DC | PRN
  Administered 2014-12-11 (×2): via INTRAVENOUS

## 2014-12-11 MED ORDER — ABACAVIR SULFATE 300 MG PO TABS
600.0000 mg | ORAL_TABLET | Freq: Every day | ORAL | Status: DC
  Administered 2014-12-11 – 2014-12-13 (×3): 600 mg via ORAL
  Filled 2014-12-11 (×3): qty 2

## 2014-12-11 MED ORDER — LIDOCAINE HCL (CARDIAC) 20 MG/ML IV SOLN
INTRAVENOUS | Status: DC | PRN
  Administered 2014-12-11: 80 mg via INTRAVENOUS

## 2014-12-11 MED ORDER — PROMETHAZINE HCL 25 MG/ML IJ SOLN
6.2500 mg | INTRAMUSCULAR | Status: DC | PRN
Start: 2014-12-11 — End: 2014-12-13

## 2014-12-11 MED ORDER — HYDROMORPHONE HCL 1 MG/ML IJ SOLN: 0.2500 mg | INTRAMUSCULAR | Status: DC | PRN

## 2014-12-11 MED ORDER — CEFTRIAXONE SODIUM IN DEXTROSE 20 MG/ML IV SOLN: 1.0000 g | Freq: Once | INTRAVENOUS | Status: DC

## 2014-12-11 SURGICAL SUPPLY — 24 items
BAG URINE DRAINAGE (UROLOGICAL SUPPLIES) ×2 IMPLANT
BAG URO CATCHER STRL LF (DRAPE) ×2 IMPLANT
BASKET LASER NITINOL 1.9FR (BASKET) ×2 IMPLANT
BASKET ZERO TIP NITINOL 2.4FR (BASKET) IMPLANT
CATH FOLEY 2WAY SLVR  5CC 16FR (CATHETERS) ×1
CATH FOLEY 2WAY SLVR 5CC 16FR (CATHETERS) ×1 IMPLANT
CATH FOLEY LATEX FREE 16FR (CATHETERS) ×2 IMPLANT
CATH INTERMIT  6FR 70CM (CATHETERS) ×2 IMPLANT
CLOTH BEACON ORANGE TIMEOUT ST (SAFETY) ×2 IMPLANT
FIBER LASER FLEXIVA 1000 (UROLOGICAL SUPPLIES) IMPLANT
FIBER LASER FLEXIVA 200 (UROLOGICAL SUPPLIES) ×2 IMPLANT
FIBER LASER FLEXIVA 365 (UROLOGICAL SUPPLIES) IMPLANT
FIBER LASER FLEXIVA 550 (UROLOGICAL SUPPLIES) IMPLANT
FIBER LASER TRAC TIP (UROLOGICAL SUPPLIES) IMPLANT
GLOVE BIOGEL M 8.0 STRL (GLOVE) ×10 IMPLANT
GOWN STRL REUS W/ TWL XL LVL3 (GOWN DISPOSABLE) ×2 IMPLANT
GOWN STRL REUS W/TWL XL LVL3 (GOWN DISPOSABLE) ×4 IMPLANT
GUIDEWIRE ANG ZIPWIRE 038X150 (WIRE) IMPLANT
GUIDEWIRE STR DUAL SENSOR (WIRE) ×2 IMPLANT
MANIFOLD NEPTUNE II (INSTRUMENTS) ×2 IMPLANT
PACK CYSTO (CUSTOM PROCEDURE TRAY) ×2 IMPLANT
SHEATH ACCESS URETERAL 38CM (SHEATH) ×2 IMPLANT
STENT CONTOUR 6FRX26X.038 (STENTS) ×2 IMPLANT
TUBING CONNECTING 10 (TUBING) ×2 IMPLANT

## 2014-12-11 NOTE — Transfer of Care (Signed)
Immediate Anesthesia Transfer of Care Note  Patient: Thomas [name]  Procedure(s) Performed: Procedure(s): CYSTOSCOPY WITH RETROGRADE PYELOGRAM, URETEROSCOPY, STONE REMOVAL, AND STENT PLACEMENT (Left) HOLMIUM LASER APPLICATION (Left)  Patient Location: PACU  Anesthesia Type:General  Level of Consciousness:  sedated, patient cooperative and responds to stimulation  Airway & Oxygen Therapy:Patient Spontanous Breathing and Patient connected to face mask oxgen  Post-op Assessment:  Report given to PACU RN and Post -op Vital signs reviewed and stable  Post vital signs:  Reviewed and stable  Last Vitals:  Filed Vitals:   12/11/14 0503  BP: 101/53  Pulse: 95  Temp: 37.1 C  Resp: 16    Complications: No apparent anesthesia complications

## 2014-12-11 NOTE — Anesthesia Postprocedure Evaluation (Signed)
  Anesthesia Post-op Note  Patient: Thomas [name]  Procedure(s) Performed: Procedure(s): CYSTOSCOPY WITH RETROGRADE PYELOGRAM, URETEROSCOPY, STONE REMOVAL, AND STENT PLACEMENT (Left) HOLMIUM LASER APPLICATION (Left)  Patient Location: PACU  Anesthesia Type:General  Level of Consciousness: awake and alert   Airway and Oxygen Therapy: Patient Spontanous Breathing  Post-op Pain: none  Post-op Assessment: Post-op Vital signs reviewed  Post-op Vital Signs: stable  Last Vitals:  Filed Vitals:   12/11/14 1415  BP: 92/48  Pulse: 73  Temp:   Resp: 17    Complications: No apparent anesthesia complications

## 2014-12-11 NOTE — Progress Notes (Signed)
Patient wants to shower.RN notified MD on call. Answering service stated that they will notify the MD on call.

## 2014-12-11 NOTE — Op Note (Signed)
Preoperative diagnosis: Left ureteral calculus  Postoperative diagnosis: Left ureteral calculus  Procedure:  1. Cystoscopy 2. Left semi-rigid ureteroscopy and stone removal 3. Ureteroscopic laser lithotripsy 4. Left ureteral stent placement (6Fr x 26 cm) with dangler string 5. Left retrograde pyelography with interpretation  Surgeon: Kathie Rhodes MD  Resident: Langley Adie, MD  Anesthesia: General  Complications: None  Intraoperative findings: Left retrograde pyelography demonstrated left mid ureteral stone just above vessels as expected.  Successful laser lithotripsy of stone with basket extraction.  EBL: Minimal  Specimens: 1. Left ureteral calculus  Disposition of specimens: [facility] for stone analysis  Indication: Thomas [name] is a 34 y.o. male patient with complex medical history - HIV, lymphoma and solitary kidney.  Has left obstructing ureteral stone in solitary kidney.  Also has UTI.  After reviewing the management options for treatment, they elected to proceed with the above surgical procedure(s). We have discussed the potential benefits and risks of the procedure, side effects of the proposed treatment, the likelihood of the patient achieving the goals of the procedure, and any potential problems that might occur during the procedure or recuperation. Informed consent has been obtained.  Description of procedure:  The patient was taken to the operating room and general anesthesia was induced.  The patient was placed in the dorsal lithotomy position, prepped and draped in the usual sterile fashion, and preoperative antibiotics were administered. A preoperative time-out was performed.   Cystourethroscopy was performed.  The patient's urethra was examined and was normal. The bladder was then systematically examined in its entirety. There was no evidence for any bladder tumors, stones, or other mucosal pathology.    Attention then turned to the Left  ureteral orifice and a ureteral catheter was used to intubate the ureteral orifice.  Omnipaque contrast was injected through the ureteral catheter and a retrograde pyelogram was performed with findings as dictated above.  A 0.38 sensor guidewire was then advanced up the Left ureter into the renal pelvis under fluoroscopic guidance.  A semi-rigid ureteroscope was then advanced through the access sheath into the ureter next to the guidewire and the calculus was identified and was located in the left mid-ureter just above the level of the vessels as expected. The stone was then fragmented with the 200 micron holmium laser fiber.   All sizable stones were then removed with a basket.  Reinspection of the ureter/renal pelvis revealed no remaining visible stones or fragments of significant size.  The guidewire was backloaded through the cystoscope and a ureteral stent was advance over the wire using the Seldinger technique.  The stent was positioned appropriately under fluoroscopic and cystoscopic guidance.  The wire was then removed with an adequate stent curl noted in the renal pelvis as well as in the bladder. Dangler string was left attached to the stent.  The bladder was then emptied and the procedure ended.  The patient appeared to tolerate the procedure well and without complications.  The patient was able to be awakened and transferred to the recovery unit in satisfactory condition.  I was present and participated in the entire case.

## 2014-12-11 NOTE — Progress Notes (Signed)
Progress Note   Thomas [name] LOV:[id number] DOB: [birth year] DOA: 12/10/2014 PCP: Philis Fendt, MD   Brief Narrative:   Thomas [name] is an 34 y.o. male PMH of HIV and Hodgkin's disease status post chemotherapy, prior history of urolithiasis and right sided hydronephrosis causing obstructive uropathy with history of right nephrostomy tube placement and right laparoscopic nephrectomy who presented after his mother insisted he come to the ER for evaluation of generalized weakness. The patient has a solitary left kidney and was noted to have a creatinine elevation over his usual baseline values. A subsequent renal ultrasound showed progressive hydronephrosis.  Assessment/Plan:   Principal Problem:   Acute on chronic renal failure / stage III chronic kidney disease status post right nephrectomy 04/30/14 - Baseline creatinine around 2.0, current creatinine elevated over usual baseline values. - Renal ultrasound shows worsening hydronephrosis. - Urology consultation requested.  Active Problems:   Hypokalemia - Replete with 40 mEq of oral potassium.    History of stage III a Hodgkin's disease - Status post ABVD chemotherapy with last oncology visit 05/19/15, note indicating near complete response.    HIV disease - Last CD4 count 400. Repeat CD4 count/viral load pending. Sees Dr. Linus Salmons. Burnis Medin ask pharmacy to review antiviral treatment to make sure this is not contraindicated given his creatinine clearance.    Sepsis secondary to UTI (urinary tract infection) - Continue empiric Rocephin. Follow-up cultures.    Hyperlipidemia - Continue Lipitor.    DVT Prophylaxis - Continue heparin.  Code Status: Full. Family Communication: No family currently at the bedside. Disposition Plan: Home when renal function stable, likely several more days.   IV Access:    Peripheral IV   Procedures and diagnostic studies:   Dg Chest 2 View  12/10/2014   CLINICAL DATA:  Lack of  appetite excessive tiredness for 2 days, history of sinus tachycardia, coronary artery disease, prior cardiac arrest, former smoker, HIV, chronic renal insufficiency  EXAM: CHEST  2 VIEW  COMPARISON:  05/16/2014  FINDINGS: Normal heart size, mediastinal contours, and pulmonary vascularity.  Minimal peribronchial thickening and hyperinflation.  No acute infiltrate, pleural effusion or pneumothorax.  Osseous structures unremarkable.  IMPRESSION: Minimal peribronchial thickening and hyperinflation, question due to bronchitis or asthma.  No acute infiltrate.   Electronically Signed   By: Lavonia Dana M.D.   On: 12/10/2014 18:37   US Renal  12/11/2014   CLINICAL DATA:  34 year old male with lower urinary tract infection. Current history of Hodgkin lymphoma, HIV positive. Right nephrectomy for nonfunctioning right kidney. Personal history of prior pyelonephritis, nephrostomy tubes. Subsequent encounter.  EXAM: RENAL / URINARY TRACT ULTRASOUND COMPLETE  COMPARISON:  PET-CT 11/06/2014. Renal ultrasound 05/19/2014 and earlier.  FINDINGS: Right Kidney:  Length: Surgically absent.  Left Kidney:  Length: 15.8 cm. Hydronephrosis. The degree of hydronephrosis has progressed compared to 05/19/2014, and appears similar to that demonstrated on the recent PET-CT. No debris evident in the collecting system. Cortical echogenicity is at the upper limits of normal.  Bladder:  A faint left ureteral jet is detected by Doppler (image 28). No debris identified within the bladder.  IMPRESSION: Solitary left kidney with progressed hydronephrosis since October. This appears similar to that depicted in April, where an 8 mm obstructing left ureteral calculus was demonstrated (please also see that report).   Electronically Signed   By: Genevie Ann M.D.   On: 12/11/2014 08:21   Dg Chest Port 1 View  12/10/2014   CLINICAL DATA:  Decreased  appetite. Extensive past medical history including HIV, cardiac arrest, anemia and kidney cancer  EXAM:  PORTABLE CHEST - 1 VIEW  COMPARISON:  12/10/2014; 05/16/2014  FINDINGS: Unchanged cardiac silhouette and mediastinal contours. No focal parenchymal opacities. No pleural effusion or pneumothorax. No evidence of edema. No acute osseus abnormalities. Surgical clips overlie the right supraclavicular fossa.  IMPRESSION: No acute cardiopulmonary disease. Specifically, no evidence of pneumonia.   Electronically Signed   By: Sandi Mariscal M.D.   On: 12/10/2014 22:21    Medical Consultants:    Urology  Anti-Infectives:    Rocephin 12/10/14--->  Subjective:   Thomas [name] tells me that he does not feel unwell. He tells me the reason he came to the hospital is that his mother thought he was "acting funny ". Denies any nausea, vomiting or pain. Denies changes in his bowel habits. Says his appetite is okay.  Objective:    Filed Vitals:   12/10/14 2122 12/10/14 2315 12/10/14 2335 12/11/14 0503  BP: 107/67 97/51 105/60 101/53  Pulse: 98 89 55 95  Temp: 100.4 F (38 C) 100.4 F (38 C) 99.2 F (37.3 C) 98.7 F (37.1 C)  TempSrc: Oral Oral Oral Oral  Resp: 14 14 16 16   Height: 6\' 1"  (1.854 m)  6\' 1"  (1.854 m)   Weight: 72.576 kg (160 lb)  75.252 kg (165 lb 14.4 oz)   SpO2: 100% 100% 100% 100%    Intake/Output Summary (Last 24 hours) at 12/11/14 7425 Last data filed at 12/10/14 2312  Gross per 24 hour  Intake   2000 ml  Output      0 ml  Net   2000 ml    Exam: Gen:  NAD Cardiovascular:  RRR, No M/R/G Respiratory:  Lungs CTAB Gastrointestinal:  Abdomen soft, NT/ND, + BS Extremities:  No C/E/C   Data Reviewed:    Labs: Basic Metabolic Panel:  Recent Labs Lab 12/10/14 1657 12/10/14 2245 12/11/14 0526  NA 139 139 138  K 4.1 3.6 3.1*  CL 104 107 111  CO2 24 20* 18*  GLUCOSE 110* 84 96  BUN 20 17 16   CREATININE 2.91* 2.79* 2.60*  CALCIUM 9.3 8.3* 7.8*   GFR Estimated Creatinine Clearance: 43.8 mL/min (by C-G formula based on Cr of 2.6). Liver Function  Tests:  Recent Labs Lab 12/10/14 1724 12/10/14 2245 12/11/14 0526  AST 10* 13* 9*  ALT 13* 13* 10*  ALKPHOS 138* 119 105  BILITOT 0.7 0.5 0.4  PROT 8.9* 8.0 6.6  ALBUMIN 3.8 3.4* 2.6*   No results for input(s): LIPASE, AMYLASE in the last 168 hours. No results for input(s): AMMONIA in the last 168 hours. Coagulation profile  Recent Labs Lab 12/10/14 2245  INR 1.22    CBC:  Recent Labs Lab 12/10/14 1657 12/10/14 2245 12/11/14 0526  WBC 8.1 6.8 5.5  NEUTROABS 6.2 5.0  --   HGB 14.0 11.3* 10.7*  HCT 41.0 33.8* 32.1*  MCV 105.1* 106.0* 104.9*  PLT 185 178 151   Cardiac Enzymes:  Recent Labs Lab 12/10/14 1724  TROPONINI <0.03   CBG:  Recent Labs Lab 12/11/14 0744  GLUCAP 92   Thyroid function studies:  Recent Labs  12/10/14 2245  TSH 1.437   Sepsis Labs:  Recent Labs Lab 12/10/14 1657 12/10/14 1927 12/10/14 2245 12/11/14 0052 12/11/14 0526  PROCALCITON  --   --  10.37  --   --   WBC 8.1  --  6.8  --  5.5  LATICACIDVEN  --  3.25* 2.8* 0.8  --    Microbiology Recent Results (from the past 240 hour(s))  Rapid strep screen     Status: None   Collection Time: 12/10/14  7:12 PM  Result Value Ref Range Status   Streptococcus, Group A Screen (Direct) NEGATIVE NEGATIVE Final    Comment: (NOTE) A Rapid Antigen test may result negative if the antigen level in the sample is below the detection level of this test. The FDA has not cleared this test as a stand-alone test therefore the rapid antigen negative result has reflexed to a Group A Strep culture.      Medications:   . Abacavir-Dolutegravir-Lamivud  1 tablet Oral Daily  . atorvastatin  10 mg Oral Daily  . cefTRIAXone (ROCEPHIN)  IV  1 g Intravenous QHS  . docusate sodium  100 mg Oral BID  . feeding supplement (ENSURE ENLIVE)  237 mL Oral TID BM  . ferrous sulfate  325 mg Oral Q breakfast  . folic acid  1 mg Oral Daily  . heparin  5,000 Units Subcutaneous 3 times per day  . metoprolol  tartrate  25 mg Oral BID  . multivitamin with minerals  1 tablet Oral Daily  . potassium chloride  40 mEq Oral Once  . sodium chloride  3 mL Intravenous Q12H  . thiamine  100 mg Oral Daily  . ticagrelor  90 mg Oral BID   Continuous Infusions:   Time spent: 35 minutes with > 50% of time discussing current diagnostic test results, clinical impression and plan of care.    LOS: 1 day   Alfhild Partch  [facility] Pager [phone]. If unable to reach me by pager, please call my cell phone at [phone].  *Please refer to [email or web address], password [email or web address] to get updated schedule on who will round on this patient, as hospitalists switch teams weekly. If 7PM-7AM, please contact night-coverage at [email or web address], password [email or web address] for any overnight needs.  12/11/2014, 9:07 AM

## 2014-12-11 NOTE — Progress Notes (Signed)
Initial Nutrition Assessment  DOCUMENTATION CODES:  Not applicable, physical assessment unable to be done at this time.  INTERVENTION: - Increase Ensure Enlive to TID - RD to continue to monitor for needs  NUTRITION DIAGNOSIS:  Increased nutrient needs related to chronic illness as evidenced by estimated needs.  GOAL:  Patient will meet greater than or equal to 90% of their needs  MONITOR:  PO intake, Supplement acceptance, Weight trends, I & O's  REASON FOR ASSESSMENT:  Malnutrition Screening Tool  ASSESSMENT: Pt with hx of HIV, CKD, kidney cancer, hyperlipidemia, cardiac arrest, and anemia. He was admitted with weakness and found to have acute on chronic renal failure.  Pt in PACU following surgery for L ureteral calculus. Pt's mother in the room provides all information. PTA pt had a very good appetite and ate well until 2-3 days ago. During that time he did not have an appetite due to feeling sick and weak although he was able to eat a little bit of fish last night. PTA he was drinking Ensure; order currently in for Ensure Enlive BID. Will increase order to TID. Pt's mother reports pt was gaining weight intentionally PTA.  Unable to meet needs with current NPO status but expect him to do well with diet advancement. Physical assessment unable to be done at this time. Labs and medications reviewed; K: 3.1 mmol/L, creatinine trending down, GFR: 36.  Height:  Ht Readings from Last 1 Encounters:  12/11/14 6\' 1"  (1.854 m)    Weight:  Wt Readings from Last 1 Encounters:  12/11/14 165 lb 12.6 oz (75.2 kg)    Ideal Body Weight:  83.6 kg (kg)  Wt Readings from Last 10 Encounters:  12/11/14 165 lb 12.6 oz (75.2 kg)  10/09/14 170 lb (77.111 kg)  05/20/14 149 lb 14.6 oz (68 kg)  04/30/14 149 lb 3 oz (67.671 kg)  04/01/14 149 lb (67.586 kg)  03/02/14 132 lb 11.2 oz (60.192 kg)  02/13/14 143 lb 8 oz (65.091 kg)  01/29/14 149 lb (67.586 kg)  01/22/14 419 lb 3.2 oz (190.148  kg)  01/16/14 135 lb 14.4 oz (61.644 kg)    BMI:  Body mass index is 21.88 kg/(m^2).  Estimated Nutritional Needs:  Kcal:  2200-2400  Protein:  90-110 grams  Fluid:  2-2.5L/day  Skin:  Reviewed, no issues  Diet Order:  Diet regular Room service appropriate?: Yes; Fluid consistency:: Thin  EDUCATION NEEDS:  No education needs identified   Intake/Output Summary (Last 24 hours) at 12/11/14 1439 Last data filed at 12/11/14 1408  Gross per 24 hour  Intake   3100 ml  Output    300 ml  Net   2800 ml    Last BM:  PTA   Jarome Matin, RD, LDN Inpatient Clinical Dietitian Pager # [phone] After hours/weekend pager # [phone]

## 2014-12-11 NOTE — Anesthesia Preprocedure Evaluation (Signed)
Anesthesia Evaluation  Patient identified by MRN, date of birth, ID band Patient awake  General Assessment Comment:Hodgkin's disease. Recent chemotherapy including adriamycin and bleomycin. Recent ECHO and CXR normal.  Reviewed: Allergy & Precautions, H&P , NPO status , Patient's Chart, lab work & pertinent test results  Airway Mallampati: II  TM Distance: >3 FB Neck ROM: Full    Dental  (+) Teeth Intact   Pulmonary former smoker,  Quit smoking 09-21-13. Denies pulmonary symptoms. breath sounds clear to auscultation  Pulmonary exam normal       Cardiovascular Exercise Tolerance: Good hypertension, Pt. on home beta blockers Normal cardiovascular examRhythm:Regular Rate:Normal     Neuro/Psych negative neurological ROS  negative psych ROS   GI/Hepatic negative GI ROS, Neg liver ROS,   Endo/Other  negative endocrine ROS  Renal/GU Cr 2.04 K 3.9     Musculoskeletal   Abdominal   Peds  Hematology  (+) anemia , HIV,   Anesthesia Other Findings   Reproductive/Obstetrics negative OB ROS                             Anesthesia Physical Anesthesia Plan  ASA: III  Anesthesia Plan: General ETT   Post-op Pain Management:    Induction: Intravenous  Airway Management Planned: Oral ETT  Additional Equipment:   Intra-op Plan:   Post-operative Plan: Extubation in OR  Informed Consent: I have reviewed the patients History and Physical, chart, labs and discussed the procedure including the risks, benefits and alternatives for the proposed anesthesia with the patient or authorized representative who has indicated his/her understanding and acceptance.   Dental advisory given  Plan Discussed with: CRNA and Surgeon  Anesthesia Plan Comments:         Anesthesia Quick Evaluation

## 2014-12-11 NOTE — Progress Notes (Signed)
RN notified PCP on call for clarification of Ultrasound order to be changed to Renal ultrasound r/t C.C. UTI/Pyelonephritis.

## 2014-12-11 NOTE — Consult Note (Signed)
Urology Consult   Physician requesting consult: Rama  Reason for consult: Left hydronephrosis with elevated Cr  History of Present Illness: Thomas [name] is a 34 y.o. male with PMH significant for nephrolithiasis, right nephrectomy, HIV, CAD s/p stenting, Hodgkins s/p chemo, and CKD.  He was admitted yesterday with complaints of weakness and fatigue.  Pt was found to have a UTI with acute on chronic renal insufficiency.  Urine was sent for culture and pt was started on Rocephin.    His Cr on admission was 2.91 with a baseline of 2.0. Cr has trended down slightly to 2.6 today.  Renal U/S revealed increased left hydronephrosis when compared with previous imaging in 04/2014 and 10/2014. PET CT scan in 10/2014 revealed a 34mm obstructing left ureteral stone at the level of the pelvic brim.    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  He is currently resting comfortably and without complaint except that he hasn't been able to sleep well.  He denies HA, dizziness, F/C, CP, SOB, N/V, abdominal pain, and flank pain. He is voiding without difficulty.    He ate a portion of a sandwich with ginger ale at around 10am this morning.      Past Medical History  Diagnosis Date  . HIV disease 06/25/2012  . Skin lesion 07/03/2012  . Leg pain 07/03/2012  . Sinus tachycardia 08/06/2012  . Penile cyst 08/06/2012  . History of blood transfusion july 2015 per pt  . 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 Chronic renal insufficiency   . Cardiac arrest 05/15/2014  . CKD (chronic kidney disease) s/p Right nephrectomy (04/30/2014) 05/19/2014    Past Surgical History  Procedure Laterality Date  . 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  this after several previous I and Ds of rectal abcess.   [name] node biopsy Right 08/08/2013    Procedure:  SUPRACLAVICULAR LYMPH NODE BIOPSY;  Surgeon: Gaye Pollack, MD;  Location: [place] [place] [facility];  Service: Thoracic;  Laterality: Right;  . 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 Tee without cardioversion N/A 02/27/2014    Procedure: TRANSESOPHAGEAL ECHOCARDIOGRAM (TEE);  Surgeon: Pixie Casino, MD;  Location: [place];  Service: Cardiovascular;  Laterality: N/A;  . Pac placed and later removed    . Right nephrostomy tube  for last month and 1/2  . Laparoscopic nephrectomy Right 04/30/2014    Procedure: LAPAROSCOPIC NEPHRECTOMY AND URETERECTOMY;  Surgeon: Raynelle Bring, MD;  Location: [facility];  Service: Urology;  Laterality: Right;  . Cardiac catheterization  05/19/2014  . Coronary stent placement  05/19/2014    OMI   & PROXIMAL CIRCUMFLEX  . Left heart catheterization with coronary angiogram N/A 05/18/2014    Procedure: LEFT HEART CATHETERIZATION WITH CORONARY ANGIOGRAM;  Surgeon: Laverda Page, MD;  Location: [facility] CATH LAB;  Service: Cardiovascular;  Laterality: N/A;  . Percutaneous coronary stent intervention (pci-s) N/A 05/19/2014    Procedure: PERCUTANEOUS CORONARY STENT INTERVENTION (PCI-S);  Surgeon: Laverda Page, MD;  Location: [facility] CATH LAB;  Service: Cardiovascular;  Laterality: N/A;    Current Hospital Medications:  Home Meds:    Medication List    ASK your doctor about these medications        Abacavir-Dolutegravir-Lamivud 600-50-300 MG Tabs  Take 1 tablet by mouth daily.     atorvastatin 80 MG tablet  Commonly known as:  LIPITOR  Take 1 tablet (80 mg total) by mouth daily at 6 PM.     atorvastatin 10 MG tablet  Commonly known as:  LIPITOR  Take 10 mg by mouth daily.     docusate sodium 100 MG capsule  Commonly known as:  COLACE  Take 1 capsule (100 mg total) by mouth 2 (two) times daily.     feeding supplement (ENSURE COMPLETE) Liqd  Take 237 mLs by mouth 3 (three) times daily between meals.     ferrous sulfate 325 (65 FE) MG tablet  TAKE 1 TABLET (325 MG TOTAL) BY MOUTH  TWO   (TWO) TIMES DAILY WITH A MEAL.     metoprolol tartrate 25 MG tablet  Commonly known as:  LOPRESSOR  Take 1 tablet (25 mg total) by mouth 2 (two) times daily.     oxyCODONE 5 MG immediate release tablet  Commonly known as:  ROXICODONE  Take 1 tablet (5 mg total) by mouth every 4 (four) hours as needed for severe pain.     silver sulfADIAZINE 1 % cream  Commonly known as:  SILVADENE  Apply topically 2 (two) times daily.     ticagrelor 90 MG Tabs tablet  Commonly known as:  BRILINTA  Take 1 tablet (90 mg total) by mouth 2 (two) times daily.        Scheduled Meds: . abacavir  600 mg Oral Daily   And  . dolutegravir  50 mg Oral Daily   And  . lamiVUDine  150 mg Oral Daily  . atorvastatin  10 mg Oral Daily  . cefTRIAXone (ROCEPHIN)  IV  1 g Intravenous 30 min Pre-Op  . cefTRIAXone (ROCEPHIN)  IV  1 g Intravenous QHS  . docusate sodium  100 mg Oral BID  . feeding supplement (ENSURE ENLIVE)  237 mL Oral TID BM  . ferrous sulfate  325 mg Oral Q breakfast  . folic acid  1 mg Oral Daily  . heparin  5,000 Units Subcutaneous 3 times per day  . metoprolol tartrate  25 mg Oral BID  . multivitamin with minerals  1 tablet Oral Daily  . sodium chloride  3 mL Intravenous Q12H  . thiamine  100 mg Oral Daily  . ticagrelor  90 mg Oral BID   Continuous Infusions:  PRN Meds:.  Allergies:  Allergies  Allergen Reactions  . Tylenol [Acetaminophen] Other (See Comments)    sweats    History reviewed. No pertinent family history.  Social History:  reports that he quit smoking about 14 months ago. His smoking use included Cigarettes. He started smoking about 15 months ago. He has a 15 pack-year smoking history. He has never used smokeless tobacco. He reports that he does not drink alcohol or use illicit drugs.  ROS: A complete review of systems was performed.  All systems are negative except for pertinent findings as noted.  Physical Exam:  Vital signs in last 24 hours: Temp:   [98.7 F (37.1 C)-100.4 F (38 C)] 98.7 F (37.1 C) (05/20 0503) Pulse Rate:  [55-98] 95 (05/20 0503) Resp:  [14-18] 16 (05/20 0503) BP: (97-136)/(51-71) 101/53 mmHg (05/20 0503) SpO2:  [100 %] 100 % (05/20 0503) Weight:  [72.576 kg (160 lb)-75.252 kg (165 lb 14.4 oz)] 75.252 kg (165 lb 14.4 oz) (05/19 2335) Constitutional:  Alert and oriented, No acute distress Cardiovascular: Regular rate and rhythm Respiratory: Normal respiratory effort GI: Abdomen is soft, nontender, nondistended, no abdominal masses; well healed previous  nephrectomy scars  GU: No CVA tenderness Lymphatic: No lymphadenopathy Neurologic: Grossly intact, no focal deficits Psychiatric: Normal mood and affect  Laboratory Data:   Recent Labs  12/10/14 1657 12/10/14 2245 12/11/14 0526  WBC 8.1 6.8 5.5  HGB 14.0 11.3* 10.7*  HCT 41.0 33.8* 32.1*  PLT 185 178 151     Recent Labs  12/10/14 1657 12/10/14 2245 12/11/14 0526  NA 139 139 138  K 4.1 3.6 3.1*  CL 104 107 111  GLUCOSE 110* 84 96  BUN 20 17 16   CALCIUM 9.3 8.3* 7.8*  CREATININE 2.91* 2.79* 2.60*     Results for orders placed or performed during the hospital encounter of 12/10/14 (from the past 24 hour(s))  CBC     Status: Abnormal   Collection Time: 12/10/14  4:57 PM  Result Value Ref Range   WBC 8.1 4.0 - 10.5 K/uL   RBC 3.90 (L) 4.22 - 5.81 MIL/uL   Hemoglobin 14.0 13.0 - 17.0 g/dL   HCT 41.0 39.0 - 52.0 %   MCV 105.1 (H) 78.0 - 100.0 fL   MCH 35.9 (H) 26.0 - 34.0 pg   MCHC 34.1 30.0 - 36.0 g/dL   RDW 12.4 11.5 - 15.5 %   Platelets 185 150 - 400 K/uL  Basic metabolic panel     Status: Abnormal   Collection Time: 12/10/14  4:57 PM  Result Value Ref Range   Sodium 139 135 - 145 mmol/L   Potassium 4.1 3.5 - 5.1 mmol/L   Chloride 104 101 - 111 mmol/L   CO2 24 22 - 32 mmol/L   Glucose, Bld 110 (H) 65 - 99 mg/dL   BUN 20 6 - 20 mg/dL   Creatinine, Ser 2.91 (H) 0.61 - 1.24 mg/dL   Calcium 9.3 8.9 - 10.3 mg/dL   GFR calc non Af  Amer 27 (L) >60 mL/min   GFR calc Af Amer 31 (L) >60 mL/min   Anion gap 11 5 - 15  Differential     Status: Abnormal   Collection Time: 12/10/14  4:57 PM  Result Value Ref Range   Neutrophils Relative % 79 (H) 43 - 77 %   Neutro Abs 6.2 1.7 - 7.7 K/uL   Lymphocytes Relative 14 12 - 46 %   Lymphs Abs 1.1 0.7 - 4.0 K/uL   Monocytes Relative 6 3 - 12 %   Monocytes Absolute 0.5 0.1 - 1.0 K/uL   Eosinophils Relative 1 0 - 5 %   Eosinophils Absolute 0.1 0.0 - 0.7 K/uL   Basophils Relative 0 0 - 1 %   Basophils Absolute 0.0 0.0 - 0.1 K/uL  Hepatic function panel     Status: Abnormal   Collection Time: 12/10/14  5:24 PM  Result Value Ref Range   Total Protein 8.9 (H) 6.5 - 8.1 g/dL   Albumin 3.8 3.5 - 5.0 g/dL   AST 10 (L) 15 - 41 U/L   ALT 13 (L) 17 - 63 U/L   Alkaline Phosphatase 138 (H) 38 - 126 U/L   Total Bilirubin 0.7 0.3 - 1.2 mg/dL   Bilirubin, Direct <0.1 (L) 0.1 - 0.5 mg/dL   Indirect Bilirubin NOT CALCULATED 0.3 - 0.9 mg/dL  Troponin I     Status: None   Collection Time: 12/10/14  5:24 PM  Result Value Ref Range   Troponin I <0.03 <0.031 ng/mL  Urinalysis, Routine w reflex microscopic     Status: Abnormal   Collection Time: 12/10/14  5:44  PM  Result Value Ref Range   Color, Urine YELLOW YELLOW   APPearance TURBID (A) CLEAR   Specific Gravity, Urine 1.010 1.005 - 1.030   pH 7.5 5.0 - 8.0   Glucose, UA NEGATIVE NEGATIVE mg/dL   Hgb urine dipstick MODERATE (A) NEGATIVE   Bilirubin Urine NEGATIVE NEGATIVE   Ketones, ur NEGATIVE NEGATIVE mg/dL   Protein, ur 30 (A) NEGATIVE mg/dL   Urobilinogen, UA 0.2 0.0 - 1.0 mg/dL   Nitrite POSITIVE (A) NEGATIVE   Leukocytes, UA LARGE (A) NEGATIVE  Urine microscopic-add on     Status: Abnormal   Collection Time: 12/10/14  5:44 PM  Result Value Ref Range   WBC, UA TOO NUMEROUS TO COUNT <3 WBC/hpf   RBC / HPF 3-6 <3 RBC/hpf   Bacteria, UA FEW (A) RARE  Rapid strep screen     Status: None   Collection Time: 12/10/14  7:12 PM   Result Value Ref Range   Streptococcus, Group A Screen (Direct) NEGATIVE NEGATIVE  I-Stat CG4 Lactic Acid, ED     Status: Abnormal   Collection Time: 12/10/14  7:27 PM  Result Value Ref Range   Lactic Acid, Venous 3.25 (HH) 0.5 - 2.0 mmol/L   Comment NOTIFIED PHYSICIAN   CBC with Differential/Platelet     Status: Abnormal   Collection Time: 12/10/14 10:45 PM  Result Value Ref Range   WBC 6.8 4.0 - 10.5 K/uL   RBC 3.19 (L) 4.22 - 5.81 MIL/uL   Hemoglobin 11.3 (L) 13.0 - 17.0 g/dL   HCT 33.8 (L) 39.0 - 52.0 %   MCV 106.0 (H) 78.0 - 100.0 fL   MCH 35.4 (H) 26.0 - 34.0 pg   MCHC 33.4 30.0 - 36.0 g/dL   RDW 12.3 11.5 - 15.5 %   Platelets 178 150 - 400 K/uL   Neutrophils Relative % 73 43 - 77 %   Neutro Abs 5.0 1.7 - 7.7 K/uL   Lymphocytes Relative 17 12 - 46 %   Lymphs Abs 1.1 0.7 - 4.0 K/uL   Monocytes Relative 9 3 - 12 %   Monocytes Absolute 0.6 0.1 - 1.0 K/uL   Eosinophils Relative 1 0 - 5 %   Eosinophils Absolute 0.0 0.0 - 0.7 K/uL   Basophils Relative 0 0 - 1 %   Basophils Absolute 0.0 0.0 - 0.1 K/uL  Comprehensive metabolic panel     Status: Abnormal   Collection Time: 12/10/14 10:45 PM  Result Value Ref Range   Sodium 139 135 - 145 mmol/L   Potassium 3.6 3.5 - 5.1 mmol/L   Chloride 107 101 - 111 mmol/L   CO2 20 (L) 22 - 32 mmol/L   Glucose, Bld 84 65 - 99 mg/dL   BUN 17 6 - 20 mg/dL   Creatinine, Ser 2.79 (H) 0.61 - 1.24 mg/dL   Calcium 8.3 (L) 8.9 - 10.3 mg/dL   Total Protein 8.0 6.5 - 8.1 g/dL   Albumin 3.4 (L) 3.5 - 5.0 g/dL   AST 13 (L) 15 - 41 U/L   ALT 13 (L) 17 - 63 U/L   Alkaline Phosphatase 119 38 - 126 U/L   Total Bilirubin 0.5 0.3 - 1.2 mg/dL   GFR calc non Af Amer 29 (L) >60 mL/min   GFR calc Af Amer 33 (L) >60 mL/min   Anion gap 12 5 - 15  Lactic acid, plasma     Status: Abnormal   Collection Time: 12/10/14 10:45 PM  Result Value Ref  Range   Lactic Acid, Venous 2.8 (HH) 0.5 - 2.0 mmol/L  Procalcitonin     Status: None   Collection Time: 12/10/14  10:45 PM  Result Value Ref Range   Procalcitonin 10.37 ng/mL  Protime-INR     Status: Abnormal   Collection Time: 12/10/14 10:45 PM  Result Value Ref Range   Prothrombin Time 15.6 (H) 11.6 - 15.2 seconds   INR 1.22 0.00 - 1.49  APTT     Status: Abnormal   Collection Time: 12/10/14 10:45 PM  Result Value Ref Range   aPTT 38 (H) 24 - 37 seconds  TSH     Status: None   Collection Time: 12/10/14 10:45 PM  Result Value Ref Range   TSH 1.437 0.350 - 4.500 uIU/mL  Lactic acid, plasma     Status: None   Collection Time: 12/11/14 12:52 AM  Result Value Ref Range   Lactic Acid, Venous 0.8 0.5 - 2.0 mmol/L  Comprehensive metabolic panel     Status: Abnormal   Collection Time: 12/11/14  5:26 AM  Result Value Ref Range   Sodium 138 135 - 145 mmol/L   Potassium 3.1 (L) 3.5 - 5.1 mmol/L   Chloride 111 101 - 111 mmol/L   CO2 18 (L) 22 - 32 mmol/L   Glucose, Bld 96 65 - 99 mg/dL   BUN 16 6 - 20 mg/dL   Creatinine, Ser 2.60 (H) 0.61 - 1.24 mg/dL   Calcium 7.8 (L) 8.9 - 10.3 mg/dL   Total Protein 6.6 6.5 - 8.1 g/dL   Albumin 2.6 (L) 3.5 - 5.0 g/dL   AST 9 (L) 15 - 41 U/L   ALT 10 (L) 17 - 63 U/L   Alkaline Phosphatase 105 38 - 126 U/L   Total Bilirubin 0.4 0.3 - 1.2 mg/dL   GFR calc non Af Amer 31 (L) >60 mL/min   GFR calc Af Amer 36 (L) >60 mL/min   Anion gap 9 5 - 15  CBC     Status: Abnormal   Collection Time: 12/11/14  5:26 AM  Result Value Ref Range   WBC 5.5 4.0 - 10.5 K/uL   RBC 3.06 (L) 4.22 - 5.81 MIL/uL   Hemoglobin 10.7 (L) 13.0 - 17.0 g/dL   HCT 32.1 (L) 39.0 - 52.0 %   MCV 104.9 (H) 78.0 - 100.0 fL   MCH 35.0 (H) 26.0 - 34.0 pg   MCHC 33.3 30.0 - 36.0 g/dL   RDW 12.5 11.5 - 15.5 %   Platelets 151 150 - 400 K/uL  Glucose, capillary     Status: None   Collection Time: 12/11/14  7:44 AM  Result Value Ref Range   Glucose-Capillary 92 65 - 99 mg/dL   Recent Results (from the past 240 hour(s))  Rapid strep screen     Status: None   Collection Time: 12/10/14  7:12 PM   Result Value Ref Range Status   Streptococcus, Group A Screen (Direct) NEGATIVE NEGATIVE Final    Comment: (NOTE) A Rapid Antigen test may result negative if the antigen level in the sample is below the detection level of this test. The FDA has not cleared this test as a stand-alone test therefore the rapid antigen negative result has reflexed to a Group A Strep culture.     Renal Function:  Recent Labs  12/10/14 1657 12/10/14 2245 12/11/14 0526  CREATININE 2.91* 2.79* 2.60*   Estimated Creatinine Clearance: 43.8 mL/min (by C-G formula based on Cr of 2.6).  Radiologic Imaging: Dg Chest 2 View  12/10/2014   CLINICAL DATA:  Lack of appetite excessive tiredness for 2 days, history of sinus tachycardia, coronary artery disease, prior cardiac arrest, former smoker, HIV, chronic renal insufficiency  EXAM: CHEST  2 VIEW  COMPARISON:  05/16/2014  FINDINGS: Normal heart size, mediastinal contours, and pulmonary vascularity.  Minimal peribronchial thickening and hyperinflation.  No acute infiltrate, pleural effusion or pneumothorax.  Osseous structures unremarkable.  IMPRESSION: Minimal peribronchial thickening and hyperinflation, question due to bronchitis or asthma.  No acute infiltrate.   Electronically Signed   By: Lavonia Dana M.D.   On: 12/10/2014 18:37   US Renal  12/11/2014   CLINICAL DATA:  34 year old male with lower urinary tract infection. Current history of Hodgkin lymphoma, HIV positive. Right nephrectomy for nonfunctioning right kidney. Personal history of prior pyelonephritis, nephrostomy tubes. Subsequent encounter.  EXAM: RENAL / URINARY TRACT ULTRASOUND COMPLETE  COMPARISON:  PET-CT 11/06/2014. Renal ultrasound 05/19/2014 and earlier.  FINDINGS: Right Kidney:  Length: Surgically absent.  Left Kidney:  Length: 15.8 cm. Hydronephrosis. The degree of hydronephrosis has progressed compared to 05/19/2014, and appears similar to that demonstrated on the recent PET-CT. No debris evident  in the collecting system. Cortical echogenicity is at the upper limits of normal.  Bladder:  A faint left ureteral jet is detected by Doppler (image 28). No debris identified within the bladder.  IMPRESSION: Solitary left kidney with progressed hydronephrosis since October. This appears similar to that depicted in April, where an 8 mm obstructing left ureteral calculus was demonstrated (please also see that report).   Electronically Signed   By: Genevie Ann M.D.   On: 12/11/2014 08:21   Dg Chest Port 1 View  12/10/2014   CLINICAL DATA:  Decreased appetite. Extensive past medical history including HIV, cardiac arrest, anemia and kidney cancer  EXAM: PORTABLE CHEST - 1 VIEW  COMPARISON:  12/10/2014; 05/16/2014  FINDINGS: Unchanged cardiac silhouette and mediastinal contours. No focal parenchymal opacities. No pleural effusion or pneumothorax. No evidence of edema. No acute osseus abnormalities. Surgical clips overlie the right supraclavicular fossa.  IMPRESSION: No acute cardiopulmonary disease. Specifically, no evidence of pneumonia.   Electronically Signed   By: Sandi Mariscal M.D.   On: 12/10/2014 22:21   CLINICAL DATA: Subsequent treatment strategy for Hodgkin's. HIV positive.  EXAM: NUCLEAR MEDICINE PET SKULL BASE TO THIGH  TECHNIQUE: 8.5 mCi F-18 FDG was injected intravenously. Full-ring PET imaging was performed from the skull base to thigh after the radiotracer. CT data was obtained and used for attenuation correction and anatomic localization.  FASTING BLOOD GLUCOSE: Value: 85 mg/dl  COMPARISON: None.  FINDINGS: NECK  No hypermetabolic lymph nodes in the neck.  CHEST  No hypermetabolic mediastinal or hilar nodes. No suspicious pulmonary nodules on the CT scan.  ABDOMEN/PELVIS  No abnormal hypermetabolic activity within the liver, pancreas, adrenal glands, or spleen. No hypermetabolic lymph nodes in the abdomen or pelvis. There are small periaortic lymph nodes which  do not have measurable metabolic activity. The spleen is normal volume with normal metabolic activity.  There is persistent hydronephrosis and hydroureter of the left kidney and collecting systems secondary to an obstructing calculus at the pelvic brim measuring 8 mm within the left ureter.  SKELETON  No focal hypermetabolic activity to suggest skeletal metastasis.  IMPRESSION: 1. Complete response to chemo therapy with no residual metabolic activity within the lymph nodes. ( [place] 1) 2. Persistent chronic left hydronephrosis and hydroureter secondary to obstructing ureteral calculus.  Electronically Signed  By: Suzy Bouchard M.D.  On: 11/06/2014 15:25  Impression/Recommendation  Obstructing left ureteral calculus with elevated Cr and hydronephrosis--pt will go to the OR today for cystoscopy, ureteroscopy, laser lithotripsy, and left ureteral stent placement by Dr. Karsten Ro.  NPO.  Rocephin on call to OR (has not received dose yet today).    UTI--continue Rocephin and f/u culture.  Adjust ABx as necessary.   [place], [place] 12/11/2014, 10:35 AM

## 2014-12-11 NOTE — Anesthesia Procedure Notes (Signed)
Procedure Name: Intubation Date/Time: 12/11/2014 12:52 PM Performed by: Montel Clock Pre-anesthesia Checklist: Patient identified, Emergency Drugs available, Suction available, Patient being monitored and Timeout performed Patient Re-evaluated:Patient Re-evaluated prior to inductionOxygen Delivery Method: Circle system utilized Preoxygenation: Pre-oxygenation with 100% oxygen Intubation Type: IV induction, Rapid sequence and Cricoid Pressure applied Laryngoscope Size: Mac and 3 Grade View: Grade I Tube type: Oral Tube size: 7.5 mm Number of attempts: 1 Airway Equipment and Method: Stylet Placement Confirmation: ETT inserted through vocal cords under direct vision,  positive ETCO2 and breath sounds checked- equal and bilateral Secured at: 23 cm Tube secured with: Tape Dental Injury: Teeth and Oropharynx as per pre-operative assessment

## 2014-12-12 ENCOUNTER — Encounter (HOSPITAL_COMMUNITY): Payer: Self-pay | Admitting: Internal Medicine

## 2014-12-12 DIAGNOSIS — E876 Hypokalemia: Secondary | ICD-10-CM

## 2014-12-12 DIAGNOSIS — N139 Obstructive and reflux uropathy, unspecified: Secondary | ICD-10-CM

## 2014-12-12 DIAGNOSIS — N200 Calculus of kidney: Secondary | ICD-10-CM

## 2014-12-12 HISTORY — DX: Obstructive and reflux uropathy, unspecified: N13.9

## 2014-12-12 LAB — BASIC METABOLIC PANEL
Anion gap: 8 (ref 5–15)
BUN: 20 mg/dL (ref 6–20)
CALCIUM: 8.6 mg/dL — AB (ref 8.9–10.3)
CO2: 20 mmol/L — ABNORMAL LOW (ref 22–32)
CREATININE: 2.49 mg/dL — AB (ref 0.61–1.24)
Chloride: 111 mmol/L (ref 101–111)
GFR calc Af Amer: 38 mL/min — ABNORMAL LOW (ref >60)
GFR, EST NON AFRICAN AMERICAN: 33 mL/min — AB (ref >60)
GLUCOSE: 109 mg/dL — AB (ref 65–99)
POTASSIUM: 4.2 mmol/L (ref 3.5–5.1)
Sodium: 139 mmol/L (ref 135–145)

## 2014-12-12 LAB — URINE CULTURE
Colony Count: NO GROWTH
Culture: NO GROWTH

## 2014-12-12 LAB — HEMOGLOBIN A1C
HEMOGLOBIN A1C: 5.3 % (ref 4.8–5.6)
Mean Plasma Glucose: 105 mg/dL

## 2014-12-12 LAB — GLUCOSE, CAPILLARY: Glucose-Capillary: 102 mg/dL — ABNORMAL HIGH (ref 65–99)

## 2014-12-12 MED ORDER — SODIUM CHLORIDE 0.9 % IV SOLN
INTRAVENOUS | Status: DC
  Administered 2014-12-12: 10:00:00 via INTRAVENOUS

## 2014-12-12 NOTE — Progress Notes (Signed)
Progress Note   Thomas [name] RDE:[id number] DOB: [birth year] DOA: 12/10/2014 PCP: Philis Fendt, MD   Brief Narrative:   Thomas [name] is an 34 y.o. male PMH of HIV and Hodgkin's disease status post chemotherapy, prior history of urolithiasis and right sided hydronephrosis causing obstructive uropathy with history of right nephrostomy tube placement and right laparoscopic nephrectomy who presented after his mother insisted he come to the ER for evaluation of generalized weakness. The patient has a solitary left kidney and was noted to have a creatinine elevation over his usual baseline values. A subsequent renal ultrasound showed progressive hydronephrosis.  Assessment/Plan:   Principal Problem:   Acute on chronic renal failure / stage III chronic kidney disease status post right nephrectomy 04/30/14 - Baseline creatinine around 2.0, current creatinine elevated over usual baseline values.  Slightly improved today. - Renal ultrasound shows worsening hydronephrosis. - Evaluated by urology 12/11/14 and underwent urgent cystoscopy, ureteroscopy with stone removal and laser lithotripsy with placement of a left ureteral stent.  Active Problems:   Hypokalemia - Repleted with 40 mEq of oral potassium 12/11/14.  WNL today.    History of stage III a Hodgkin's disease - Status post ABVD chemotherapy with last oncology visit 05/19/15, note indicating near complete response.    HIV disease - CD4 count 300. Sees Dr. Linus Salmons. - Continue antivirals.    Sepsis secondary to UTI (urinary tract infection) - On empiric Rocephin. Urine culture was negative.    Hyperlipidemia - Continue Lipitor.    DVT Prophylaxis - Continue heparin.  Code Status: Full. Family Communication: No family currently at the bedside.  Declines my offer to call his mother. Disposition Plan: Home when renal function stable, possibly 12/13/14 if creatinine continues to improve.   IV Access:    Peripheral  IV   Procedures and diagnostic studies:   Dg Chest 2 View  12/10/2014   CLINICAL DATA:  Lack of appetite excessive tiredness for 2 days, history of sinus tachycardia, coronary artery disease, prior cardiac arrest, former smoker, HIV, chronic renal insufficiency  EXAM: CHEST  2 VIEW  COMPARISON:  05/16/2014  FINDINGS: Normal heart size, mediastinal contours, and pulmonary vascularity.  Minimal peribronchial thickening and hyperinflation.  No acute infiltrate, pleural effusion or pneumothorax.  Osseous structures unremarkable.  IMPRESSION: Minimal peribronchial thickening and hyperinflation, question due to bronchitis or asthma.  No acute infiltrate.   Electronically Signed   By: Lavonia Dana M.D.   On: 12/10/2014 18:37   US Renal  12/11/2014   CLINICAL DATA:  34 year old male with lower urinary tract infection. Current history of Hodgkin lymphoma, HIV positive. Right nephrectomy for nonfunctioning right kidney. Personal history of prior pyelonephritis, nephrostomy tubes. Subsequent encounter.  EXAM: RENAL / URINARY TRACT ULTRASOUND COMPLETE  COMPARISON:  PET-CT 11/06/2014. Renal ultrasound 05/19/2014 and earlier.  FINDINGS: Right Kidney:  Length: Surgically absent.  Left Kidney:  Length: 15.8 cm. Hydronephrosis. The degree of hydronephrosis has progressed compared to 05/19/2014, and appears similar to that demonstrated on the recent PET-CT. No debris evident in the collecting system. Cortical echogenicity is at the upper limits of normal.  Bladder:  A faint left ureteral jet is detected by Doppler (image 28). No debris identified within the bladder.  IMPRESSION: Solitary left kidney with progressed hydronephrosis since October. This appears similar to that depicted in April, where an 8 mm obstructing left ureteral calculus was demonstrated (please also see that report).   Electronically Signed   By: Herminio Heads.D.  On: 12/11/2014 08:21   Dg Chest Port 1 View  12/10/2014   CLINICAL DATA:  Decreased appetite.  Extensive past medical history including HIV, cardiac arrest, anemia and kidney cancer  EXAM: PORTABLE CHEST - 1 VIEW  COMPARISON:  12/10/2014; 05/16/2014  FINDINGS: Unchanged cardiac silhouette and mediastinal contours. No focal parenchymal opacities. No pleural effusion or pneumothorax. No evidence of edema. No acute osseus abnormalities. Surgical clips overlie the right supraclavicular fossa.  IMPRESSION: No acute cardiopulmonary disease. Specifically, no evidence of pneumonia.   Electronically Signed   By: Sandi Mariscal M.D.   On: 12/10/2014 22:21    Medical Consultants:    Urology  Anti-Infectives:    Rocephin 12/10/14--->  Subjective:   Thomas [name] denies any discomfort from stent, no nausea or vomiting, says his appetite is OK.  No bowel complaints.  Objective:    Filed Vitals:   12/11/14 1500 12/11/14 1514 12/11/14 2121 12/12/14 0556  BP: 99/61 93/51 106/60 107/74  Pulse: 64 63 64 68  Temp:  98 F (36.7 C) 97.7 F (36.5 C) 97.5 F (36.4 C)  TempSrc:   Oral Oral  Resp: 17 16 17 18   Height:      Weight:      SpO2: 100% 100% 100% 100%    Intake/Output Summary (Last 24 hours) at 12/12/14 0904 Last data filed at 12/12/14 0655  Gross per 24 hour  Intake 3517.09 ml  Output   3603 ml  Net -85.91 ml    Exam: Gen:  NAD Cardiovascular:  RRR, No M/R/G Respiratory:  Lungs CTAB Gastrointestinal:  Abdomen soft, NT/ND, + BS Extremities:  No C/E/C   Data Reviewed:    Labs: Basic Metabolic Panel:  Recent Labs Lab 12/10/14 1657 12/10/14 2245 12/11/14 0526 12/12/14 0758  NA 139 139 138 139  K 4.1 3.6 3.1* 4.2  CL 104 107 111 111  CO2 24 20* 18* 20*  GLUCOSE 110* 84 96 109*  BUN 20 17 16 20   CREATININE 2.91* 2.79* 2.60* 2.49*  CALCIUM 9.3 8.3* 7.8* 8.6*   GFR Estimated Creatinine Clearance: 45.7 mL/min (by C-G formula based on Cr of 2.49). Liver Function Tests:  Recent Labs Lab 12/10/14 1724 12/10/14 2245 12/11/14 0526  AST 10* 13* 9*  ALT 13*  13* 10*  ALKPHOS 138* 119 105  BILITOT 0.7 0.5 0.4  PROT 8.9* 8.0 6.6  ALBUMIN 3.8 3.4* 2.6*   Coagulation profile  Recent Labs Lab 12/10/14 2245  INR 1.22    CBC:  Recent Labs Lab 12/10/14 1657 12/10/14 2245 12/11/14 0526  WBC 8.1 6.8 5.5  NEUTROABS 6.2 5.0  --   HGB 14.0 11.3* 10.7*  HCT 41.0 33.8* 32.1*  MCV 105.1* 106.0* 104.9*  PLT 185 178 151   Cardiac Enzymes:  Recent Labs Lab 12/10/14 1724  TROPONINI <0.03   CBG:  Recent Labs Lab 12/11/14 0744 12/12/14 0752  GLUCAP 92 102*   Thyroid function studies:  Recent Labs  12/10/14 2245  TSH 1.437   Sepsis Labs:  Recent Labs Lab 12/10/14 1657 12/10/14 1927 12/10/14 2245 12/11/14 0052 12/11/14 0526  PROCALCITON  --   --  10.37  --   --   WBC 8.1  --  6.8  --  5.5  LATICACIDVEN  --  3.25* 2.8* 0.8  --    Microbiology Recent Results (from the past 240 hour(s))  Urine culture     Status: None   Collection Time: 12/10/14  5:54 PM  Result Value Ref Range  Status   Specimen Description URINE, CLEAN CATCH  Final   Special Requests Immunocompromised  Final   Colony Count NO GROWTH Performed at [facility]   Final   Culture NO GROWTH Performed at [facility]   Final   Report Status 12/12/2014 FINAL  Final  Rapid strep screen     Status: None   Collection Time: 12/10/14  7:12 PM  Result Value Ref Range Status   Streptococcus, Group A Screen (Direct) NEGATIVE NEGATIVE Final    Comment: (NOTE) A Rapid Antigen test may result negative if the antigen level in the sample is below the detection level of this test. The FDA has not cleared this test as a stand-alone test therefore the rapid antigen negative result has reflexed to a Group A Strep culture.   Culture, blood (x 2)     Status: None (Preliminary result)   Collection Time: 12/10/14  9:55 PM  Result Value Ref Range Status   Specimen Description BLOOD RIGHT ANTECUBITAL  Final   Special Requests BOTTLES DRAWN AEROBIC AND  ANAEROBIC 5ML  Final   Culture   Final           BLOOD CULTURE RECEIVED NO GROWTH TO DATE CULTURE WILL BE HELD FOR 5 DAYS BEFORE ISSUING A FINAL NEGATIVE REPORT Performed at [facility]    Report Status PENDING  Incomplete  Culture, blood (x 2)     Status: None (Preliminary result)   Collection Time: 12/10/14 10:45 PM  Result Value Ref Range Status   Specimen Description BLOOD L HAND  Final   Special Requests BOTTLES DRAWN AEROBIC AND ANAEROBIC 3ML  Final   Culture   Final           BLOOD CULTURE RECEIVED NO GROWTH TO DATE CULTURE WILL BE HELD FOR 5 DAYS BEFORE ISSUING A FINAL NEGATIVE REPORT Performed at [facility]    Report Status PENDING  Incomplete  MRSA PCR Screening     Status: None   Collection Time: 12/11/14 12:05 PM  Result Value Ref Range Status   MRSA by PCR NEGATIVE NEGATIVE Final    Comment:        The GeneXpert MRSA Assay (FDA approved for NASAL specimens only), is one component of a comprehensive MRSA colonization surveillance program. It is not intended to diagnose MRSA infection nor to guide or monitor treatment for MRSA infections.      Medications:   . abacavir  600 mg Oral Daily   And  . dolutegravir  50 mg Oral Daily   And  . lamiVUDine  150 mg Oral Daily  . atorvastatin  10 mg Oral Daily  . cefTRIAXone (ROCEPHIN)  IV  1 g Intravenous QHS  . docusate sodium  100 mg Oral BID  . feeding supplement (ENSURE ENLIVE)  237 mL Oral TID BM  . ferrous sulfate  325 mg Oral Q breakfast  . folic acid  1 mg Oral Daily  . heparin  5,000 Units Subcutaneous 3 times per day  . metoprolol tartrate  25 mg Oral BID  . multivitamin with minerals  1 tablet Oral Daily  . sodium chloride  3 mL Intravenous Q12H  . thiamine  100 mg Oral Daily  . ticagrelor  90 mg Oral BID   Continuous Infusions: . lactated ringers 125 mL/hr at 12/12/14 0715    Time spent: 25 minutes.    LOS: 2 days   Kingslee Dowse  [facility] Pager [phone]. If  unable to  reach me by pager, please call my cell phone at [phone].  *Please refer to [email or web address], password [email or web address] to get updated schedule on who will round on this patient, as hospitalists switch teams weekly. If 7PM-7AM, please contact night-coverage at [email or web address], password [email or web address] for any overnight needs.  12/12/2014, 9:04 AM

## 2014-12-12 NOTE — Progress Notes (Signed)
Patient ID: Thomas [name], male   DOB: 08/15/1980, 34 y.o.   MRN: [id number] 1 Day Post-Op Subjective: Patient is without complaint. Specifically he denies any flank pain or pain in the suprapubic region. He has no voiding complaints.   Objective: Vital signs in last 24 hours: Temp:  [97.4 F (36.3 C)-98 F (36.7 C)] 97.5 F (36.4 C) (05/21 0556) Pulse Rate:  [63-82] 68 (05/21 0556) Resp:  [15-18] 18 (05/21 0556) BP: (83-108)/(45-74) 107/74 mmHg (05/21 0556) SpO2:  [100 %] 100 % (05/21 0556) Weight:  [75.2 kg (165 lb 12.6 oz)] 75.2 kg (165 lb 12.6 oz) (05/20 1436)  Intake/Output from previous day: 05/20 0701 - 05/21 0700 In: 3517.1 [P.O.:240; I.V.:3127.1; IV Piggyback:150] Out: 3603 [DTOIZ:1245] Intake/Output this shift:    Past Medical History  Diagnosis Date  . HIV disease 06/25/2012  . Skin lesion 07/03/2012  . Leg pain 07/03/2012  . Sinus tachycardia 08/06/2012  . Penile cyst 08/06/2012  . History of blood transfusion july 2015 per pt  . 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 Chronic renal insufficiency   . Cardiac arrest 05/15/2014  . CKD (chronic kidney disease) s/p Right nephrectomy (04/30/2014) 05/19/2014  . Septic shock 01/18/2014  . Enteritis 01/22/2014  . Staphylococcus aureus bacteremia 02/25/2014  . Shingles rash 02/25/2014  . CLABSI (central line-associated bloodstream infection) 02/27/2014  . NSTEMI (non-ST elevated myocardial infarction) 05/19/2014  . Hydronephrosis, right & hydroureter: secondary to obstructing ureteral calculi 03/01/2014  . Transaminitis 05/19/2014  . Acute respiratory failure with hypoxemia: post cardiac arrest 05/19/2014   Current Facility-Administered Medications  Medication Dose Route Frequency Provider Last Rate Last Dose  . abacavir (ZIAGEN) tablet 600 mg  600 mg Oral Daily Randa Spike, RPH   600 mg at 12/12/14 8099   And  . dolutegravir (TIVICAY) tablet 50 mg  50 mg Oral Daily Randa Spike, RPH   50 mg at 12/12/14 0748    And  . lamiVUDine (EPIVIR) tablet 150 mg  150 mg Oral Daily Randa Spike, RPH   150 mg at 12/12/14 0748  . atorvastatin (LIPITOR) tablet 10 mg  10 mg Oral Daily Allyne Gee, MD   10 mg at 12/11/14 1737  . cefTRIAXone (ROCEPHIN) 1 g in dextrose 5 % 50 mL IVPB  1 g Intravenous QHS Polly Cobia, RPH 100 mL/hr at 12/11/14 2239 1 g at 12/11/14 2239  . docusate sodium (COLACE) capsule 100 mg  100 mg Oral BID Allyne Gee, MD   100 mg at 12/11/14 1000  . feeding supplement (ENSURE ENLIVE) (ENSURE ENLIVE) liquid 237 mL  237 mL Oral TID BM Allyne Gee, MD   237 mL at 12/11/14 2239  . ferrous sulfate tablet 325 mg  325 mg Oral Q breakfast Allyne Gee, MD   325 mg at 12/11/14 0959  . folic acid (FOLVITE) tablet 1 mg  1 mg Oral Daily Allyne Gee, MD   1 mg at 12/11/14 0959  . heparin injection 5,000 Units  5,000 Units Subcutaneous 3 times per day Allyne Gee, MD   5,000 Units at 12/11/14 2237  . HYDROmorphone (DILAUDID) injection 0.25-0.5 mg  0.25-0.5 mg Intravenous Q5 min PRN Rica Koyanagi, MD      . lactated ringers infusion   Intravenous Continuous Montel Clock, CRNA 125 mL/hr at 12/12/14 0715    . metoprolol tartrate (LOPRESSOR) tablet 25 mg  25 mg Oral BID Allyne Gee, MD  25 mg at 12/11/14 2237  . multivitamin with minerals tablet 1 tablet  1 tablet Oral Daily Allyne Gee, MD   1 tablet at 12/11/14 [id number]  . promethazine (PHENERGAN) injection 6.25-12.5 mg  6.25-12.5 mg Intravenous Q15 min PRN Rica Koyanagi, MD      . sodium chloride 0.9 % injection 3 mL  3 mL Intravenous Q12H Allyne Gee, MD   3 mL at 12/11/14 2238  . thiamine (VITAMIN B-1) tablet 100 mg  100 mg Oral Daily Allyne Gee, MD   100 mg at 12/11/14 0959  . ticagrelor (BRILINTA) tablet 90 mg  90 mg Oral BID Allyne Gee, MD   90 mg at 12/11/14 2238   Facility-Administered Medications Ordered in Other Encounters  Medication Dose Route Frequency Provider Last Rate Last Dose  . sodium chloride 0.9 % injection  10 mL  10 mL Intravenous PRN Wyatt Portela, MD   10 mL at 10/31/13 1601    Physical Exam:  General: Patient is in no apparent distress Lungs: Normal respiratory effort, chest expands symmetrically. GI: The abdomen is soft and nontender without mass. No CVAT    Lab Results:  Recent Labs  12/10/14 1657 12/10/14 2245 12/11/14 0526  WBC 8.1 6.8 5.5  HGB 14.0 11.3* 10.7*  HCT 41.0 33.8* 32.1*   BMET  Recent Labs  12/11/14 0526 12/12/14 0758  NA 138 139  K 3.1* 4.2  CL 111 111  CO2 18* 20*  GLUCOSE 96 109*  BUN 16 20  CREATININE 2.60* 2.49*  CALCIUM 7.8* 8.6*    Recent Labs  12/10/14 2245  INR 1.22   No results for input(s): LABURIN in the last 72 hours. Results for orders placed or performed during the hospital encounter of 12/10/14  Urine culture     Status: None   Collection Time: 12/10/14  5:54 PM  Result Value Ref Range Status   Specimen Description URINE, CLEAN CATCH  Final   Special Requests Immunocompromised  Final   Colony Count NO GROWTH Performed at [facility]   Final   Culture NO GROWTH Performed at [facility]   Final   Report Status 12/12/2014 FINAL  Final  Rapid strep screen     Status: None   Collection Time: 12/10/14  7:12 PM  Result Value Ref Range Status   Streptococcus, Group A Screen (Direct) NEGATIVE NEGATIVE Final    Comment: (NOTE) A Rapid Antigen test may result negative if the antigen level in the sample is below the detection level of this test. The FDA has not cleared this test as a stand-alone test therefore the rapid antigen negative result has reflexed to a Group A Strep culture.   Culture, blood (x 2)     Status: None (Preliminary result)   Collection Time: 12/10/14  9:55 PM  Result Value Ref Range Status   Specimen Description BLOOD RIGHT ANTECUBITAL  Final   Special Requests BOTTLES DRAWN AEROBIC AND ANAEROBIC 5ML  Final   Culture   Final           BLOOD CULTURE RECEIVED NO GROWTH TO DATE CULTURE  WILL BE HELD FOR 5 DAYS BEFORE ISSUING A FINAL NEGATIVE REPORT Performed at [facility]    Report Status PENDING  Incomplete  Culture, blood (x 2)     Status: None (Preliminary result)   Collection Time: 12/10/14 10:45 PM  Result Value Ref Range Status   Specimen Description BLOOD L HAND  Final  Special Requests BOTTLES DRAWN AEROBIC AND ANAEROBIC 3ML  Final   Culture   Final           BLOOD CULTURE RECEIVED NO GROWTH TO DATE CULTURE WILL BE HELD FOR 5 DAYS BEFORE ISSUING A FINAL NEGATIVE REPORT Performed at [facility]    Report Status PENDING  Incomplete  MRSA PCR Screening     Status: None   Collection Time: 12/11/14 12:05 PM  Result Value Ref Range Status   MRSA by PCR NEGATIVE NEGATIVE Final    Comment:        The GeneXpert MRSA Assay (FDA approved for NASAL specimens only), is one component of a comprehensive MRSA colonization surveillance program. It is not intended to diagnose MRSA infection nor to guide or monitor treatment for MRSA infections.     Studies/Results: US Renal  12/11/2014   CLINICAL DATA:  34 year old male with lower urinary tract infection. Current history of Hodgkin lymphoma, HIV positive. Right nephrectomy for nonfunctioning right kidney. Personal history of prior pyelonephritis, nephrostomy tubes. Subsequent encounter.  EXAM: RENAL / URINARY TRACT ULTRASOUND COMPLETE  COMPARISON:  PET-CT 11/06/2014. Renal ultrasound 05/19/2014 and earlier.  FINDINGS: Right Kidney:  Length: Surgically absent.  Left Kidney:  Length: 15.8 cm. Hydronephrosis. The degree of hydronephrosis has progressed compared to 05/19/2014, and appears similar to that demonstrated on the recent PET-CT. No debris evident in the collecting system. Cortical echogenicity is at the upper limits of normal.  Bladder:  A faint left ureteral jet is detected by Doppler (image 28). No debris identified within the bladder.  IMPRESSION: Solitary left kidney with progressed  hydronephrosis since October. This appears similar to that depicted in April, where an 8 mm obstructing left ureteral calculus was demonstrated (please also see that report).   Electronically Signed   By: Genevie Ann M.D.   On: 12/11/2014 08:21    Assessment/Plan: Left ureteral calculus in a solitary kidney: He had an 8 mm stone in his left ureter that had been present there at least since April and there appeared to be hydronephrosis present as far back as 10/15. His stone was soft and consistent with calcium phosphate. It was completely removed. He has a stent in place and is tolerating this well.  There was a concern for possible UTI although it appears his urine culture has returned negative. He has relatively immunocompromised and this was the reason for his urgent treatment yesterday. He seems to be doing very well today.  1. He has a stent with a tether. This will be removed at the time of his follow-up. 2. His mother said that he had just missed his appointment with Dr. Alinda Money but meant to follow-up. 3. He will follow up with Dr. Alinda Money as an outpatient and will likely benefit from a full metabolic stone workup.   Hugh Garrow C 12/12/2014, 9:12 AM

## 2014-12-13 ENCOUNTER — Encounter (HOSPITAL_COMMUNITY): Payer: Self-pay | Admitting: Internal Medicine

## 2014-12-13 DIAGNOSIS — C8190 Hodgkin lymphoma, unspecified, unspecified site: Secondary | ICD-10-CM

## 2014-12-13 LAB — BASIC METABOLIC PANEL
Anion gap: 8 (ref 5–15)
BUN: 15 mg/dL (ref 6–20)
CALCIUM: 7.9 mg/dL — AB (ref 8.9–10.3)
CHLORIDE: 113 mmol/L — AB (ref 101–111)
CO2: 20 mmol/L — ABNORMAL LOW (ref 22–32)
Creatinine, Ser: 2.38 mg/dL — ABNORMAL HIGH (ref 0.61–1.24)
GFR, EST AFRICAN AMERICAN: 40 mL/min — AB (ref >60)
GFR, EST NON AFRICAN AMERICAN: 35 mL/min — AB (ref >60)
Glucose, Bld: 105 mg/dL — ABNORMAL HIGH (ref 65–99)
POTASSIUM: 3.5 mmol/L (ref 3.5–5.1)
Sodium: 141 mmol/L (ref 135–145)

## 2014-12-13 LAB — GLUCOSE, CAPILLARY: Glucose-Capillary: 90 mg/dL (ref 65–99)

## 2014-12-13 LAB — CULTURE, GROUP A STREP: STREP A CULTURE: NEGATIVE

## 2014-12-13 NOTE — Discharge Instructions (Signed)
Post stone removal/stent placement surgery instructions   Definitions:  Ureter: The duct that transports urine from the kidney to the bladder. Stent: A plastic hollow tube that is placed into the ureter, from the kidney to the bladder to prevent the ureter from swelling shut.  General instructions:  Despite the fact that no skin incisions were used, the area around the ureter and bladder is raw and irritated. The stent is a foreign body which will further irritate the bladder wall. This irritation is manifested by increased frequency of urination, both day and night, and by an increase in the urge to urinate. In some, the urge to urinate is present almost always. Sometimes the urge is strong enough that you may not be able to stop your self from urinating. The only real cure is to remove the stent and then give time for the bladder wall to heal which can't be done until the danger of the ureter swelling shut has passed. (This varies from 2-21 days).  You may see some blood in your urine while the stent is in place and a few days afterward. Do not be alarmed, even if the urine is clear for a while. Get off your feet and drink lots of fluids until clearing occurs. If you start to pass clots or don't improve, call us.  If you have a string coming from your urethra:  The stent string is attached to your ureteral stent.  Do not pull on thisIf you have a string coming from your urethra:  The stent string is attached to your ureteral stent.  Do not pull on this.  Diet:  You may return to your normal diet immediately. Because of the raw surface of your bladder, alcohol, spicy foods, foods high in acid and drinks with caffeine may cause irritation or frequency and should be used in moderation. To keep your urine flowing freely and avoid constipation, drink plenty of fluids during the day (8-10 glasses). Tip: Avoid cranberry juice because it is very acidic.  Activity:  Your physical activity doesn't need  to be restricted. However, if you are very active, you may see some blood in the urine. We suggest that you reduce your activity under the circumstances until the bleeding has stopped.  Bowels:  It is important to keep your bowels regular during the postoperative period. Straining with bowel movements can cause bleeding. A bowel movement every other day is reasonable. Use a mild laxative if needed, such as milk of magnesia 2-3 tablespoons, or 2 Dulcolax tablets. Call if you continue to have problems. If you had been taking narcotics for pain, before, during or after your surgery, you may be constipated. Take a laxative if necessary.     Medication:  You should resume your pre-surgery medications unless told not to. DO NOT RESUME YOUR ASPIRIN, or any other medicines like ibuprofen, motrin, excedrin, advil, aleve, vitamin E, fish oil as these can all cause bleeding x 7 days. In addition you may be given an antibiotic to prevent or treat infection. Antibiotics are not always necessary. All medication should be taken as prescribed until the bottles are finished unless you are having an unusual reaction to one of the drugs.  Problems you should report to [place]:  a. Fever greater than 101F. b. Heavy bleeding, or clots (see notes above about blood in urine). c. Inability to urinate. d. Drug reactions (hives, rash, nausea, vomiting, diarrhea). e. Severe burning or pain with urination that is not improving.  Followup:  You will need a followup appointment to monitor your progress in most cases. Please call the office for this appointment when you get home if your appointment has not already been scheduled. Usually the first appointment will be about 5-14 days after your surgery and if you have a stent in place it will likely be removed at that time.  Kidney Failure Kidney failure happens when the kidneys cannot remove waste and excess fluid that naturally builds up in your blood after your body breaks  down food. This leads to a dangerous buildup of waste products and fluid in the blood. HOME CARE  Follow your diet as told by your doctor.  Take all medicines as told by your doctor.  Keep all of your dialysis appointments. Call if you are unable to keep an appointment. GET HELP RIGHT AWAY IF:   You make a lot more or very little pee (urine).  Your face or ankles puff up (swell).  You develop shortness of breath.  You develop weakness, feel tired, or you do not feel hungry (appetite loss).  You feel poorly for no known reason. MAKE SURE YOU:   Understand these instructions.  Will watch your condition.  Will get help right away if you are not doing well or get worse. Document Released: 10/04/2009 Document Revised: 10/02/2011 Document Reviewed: 11/10/2009 [facility] Patient Information 2015 [place], [place]. This information is not intended to replace advice given to you by your health care provider. Make sure you discuss any questions you have with your health care provider.

## 2014-12-13 NOTE — Discharge Summary (Signed)
Physician Discharge Summary  Thomas [name] GYF:[id number] DOB: [birth year] DOA: 12/10/2014  PCP: Philis Fendt, MD  Admit date: 12/10/2014 Discharge date: 12/13/2014   Recommendations for Outpatient Follow-Up:   1. The patient will follow-up with Dr. Alinda Money in one week. Please check renal function at that visit as well as final blood culture results which were negative at discharge.   Discharge Diagnosis:   Principal Problem:    Acute on chronic renal failure Active Problems:    HIV disease    Hodgkin's disease    Hypokalemia    Acute renal failure    CKD (chronic kidney disease) s/p Right nephrectomy (04/30/2014)    Hyperlipidemia    Obstructive uropathy    Nephrolithiasis  Discharge disposition:  Home.    Discharge Condition: Improved.  Diet recommendation: Low sodium, heart healthy.    History of Present Illness:   Thomas [name] is an 34 y.o. male PMH of HIV and Hodgkin's disease status post chemotherapy, prior history of urolithiasis and right sided hydronephrosis causing obstructive uropathy with history of right nephrostomy tube placement and right laparoscopic nephrectomy who presented after his mother insisted he come to the ER for evaluation of generalized weakness. The patient has a solitary left kidney and was noted to have a creatinine elevation over his usual baseline values. A subsequent renal ultrasound showed progressive hydronephrosis.   Hospital Course by Problem:   Principal Problem:  Acute on chronic renal failure / stage III chronic kidney disease status post right nephrectomy 04/30/14 - Baseline creatinine around 2.0. Creatinine 2.38 at discharge. - Renal ultrasound showed worsening hydronephrosis. - Evaluated by urology 12/11/14 and underwent urgent cystoscopy, ureteroscopy with stone removal and laser lithotripsy with placement of a left ureteral stent. - We'll follow-up with urologist in one week.  Active Problems:  Hypokalemia -  Repleted with 40 mEq of oral potassium 12/11/14. WNL at discharge.   History of stage III a Hodgkin's disease - Status post ABVD chemotherapy with last oncology visit 05/19/15, note indicating near complete response.   HIV disease - CD4 count 300. Sees Dr. Linus Salmons. Will follow up with him per his usual schedule every 3 months. - Continue antivirals.   Sepsis secondary to UTI (urinary tract infection) - On empiric Rocephin. Urine culture was negative. Rocephin subsequently discontinued.   Hyperlipidemia - Continue Lipitor.    Medical Consultants:    Urology   Discharge Exam:   Filed Vitals:   12/13/14 0438  BP: 118/65  Pulse: 65  Temp: 98.1 F (36.7 C)  Resp: 16   Filed Vitals:   12/12/14 1020 12/12/14 1344 12/12/14 2101 12/13/14 0438  BP: 105/72 92/54 106/65 118/65  Pulse: 62 64 62 65  Temp:  97.8 F (36.6 C) 98.2 F (36.8 C) 98.1 F (36.7 C)  TempSrc:  Oral Oral Oral  Resp:  18 16 16   Height:      Weight:      SpO2:  100% 100% 100%    Gen:  NAD Cardiovascular:  RRR, No M/R/G Respiratory: Lungs CTAB Gastrointestinal: Abdomen soft, NT/ND with normal active bowel sounds. Extremities: No C/E/C   The results of significant diagnostics from this hospitalization (including imaging, microbiology, ancillary and laboratory) are listed below for reference.     Procedures and Diagnostic Studies:   Dg Chest 2 View  12/10/2014   CLINICAL DATA:  Lack of appetite excessive tiredness for 2 days, history of sinus tachycardia, coronary artery disease, prior cardiac arrest, former smoker, HIV, chronic renal insufficiency  EXAM: CHEST  2 VIEW  COMPARISON:  05/16/2014  FINDINGS: Normal heart size, mediastinal contours, and pulmonary vascularity.  Minimal peribronchial thickening and hyperinflation.  No acute infiltrate, pleural effusion or pneumothorax.  Osseous structures unremarkable.  IMPRESSION: Minimal peribronchial thickening and hyperinflation, question due to  bronchitis or asthma.  No acute infiltrate.   Electronically Signed   By: Lavonia Dana M.D.   On: 12/10/2014 18:37   US Renal  12/11/2014   CLINICAL DATA:  34 year old male with lower urinary tract infection. Current history of Hodgkin lymphoma, HIV positive. Right nephrectomy for nonfunctioning right kidney. Personal history of prior pyelonephritis, nephrostomy tubes. Subsequent encounter.  EXAM: RENAL / URINARY TRACT ULTRASOUND COMPLETE  COMPARISON:  PET-CT 11/06/2014. Renal ultrasound 05/19/2014 and earlier.  FINDINGS: Right Kidney:  Length: Surgically absent.  Left Kidney:  Length: 15.8 cm. Hydronephrosis. The degree of hydronephrosis has progressed compared to 05/19/2014, and appears similar to that demonstrated on the recent PET-CT. No debris evident in the collecting system. Cortical echogenicity is at the upper limits of normal.  Bladder:  A faint left ureteral jet is detected by Doppler (image 28). No debris identified within the bladder.  IMPRESSION: Solitary left kidney with progressed hydronephrosis since October. This appears similar to that depicted in April, where an 8 mm obstructing left ureteral calculus was demonstrated (please also see that report).   Electronically Signed   By: Genevie Ann M.D.   On: 12/11/2014 08:21   Dg Chest Port 1 View  12/10/2014   CLINICAL DATA:  Decreased appetite. Extensive past medical history including HIV, cardiac arrest, anemia and kidney cancer  EXAM: PORTABLE CHEST - 1 VIEW  COMPARISON:  12/10/2014; 05/16/2014  FINDINGS: Unchanged cardiac silhouette and mediastinal contours. No focal parenchymal opacities. No pleural effusion or pneumothorax. No evidence of edema. No acute osseus abnormalities. Surgical clips overlie the right supraclavicular fossa.  IMPRESSION: No acute cardiopulmonary disease. Specifically, no evidence of pneumonia.   Electronically Signed   By: Sandi Mariscal M.D.   On: 12/10/2014 22:21     Labs:   Basic Metabolic Panel:  Recent Labs Lab  12/10/14 1657 12/10/14 2245 12/11/14 0526 12/12/14 0758 12/13/14 0400  NA 139 139 138 139 141  K 4.1 3.6 3.1* 4.2 3.5  CL 104 107 111 111 113*  CO2 24 20* 18* 20* 20*  GLUCOSE 110* 84 96 109* 105*  BUN 20 17 16 20 15   CREATININE 2.91* 2.79* 2.60* 2.49* 2.38*  CALCIUM 9.3 8.3* 7.8* 8.6* 7.9*   GFR Estimated Creatinine Clearance: 47.8 mL/min (by C-G formula based on Cr of 2.38). Liver Function Tests:  Recent Labs Lab 12/10/14 1724 12/10/14 2245 12/11/14 0526  AST 10* 13* 9*  ALT 13* 13* 10*  ALKPHOS 138* 119 105  BILITOT 0.7 0.5 0.4  PROT 8.9* 8.0 6.6  ALBUMIN 3.8 3.4* 2.6*   Coagulation profile  Recent Labs Lab 12/10/14 2245  INR 1.22    CBC:  Recent Labs Lab 12/10/14 1657 12/10/14 2245 12/11/14 0526  WBC 8.1 6.8 5.5  NEUTROABS 6.2 5.0  --   HGB 14.0 11.3* 10.7*  HCT 41.0 33.8* 32.1*  MCV 105.1* 106.0* 104.9*  PLT 185 178 151   Cardiac Enzymes:  Recent Labs Lab 12/10/14 1724  TROPONINI <0.03   CBG:  Recent Labs Lab 12/11/14 0744 12/12/14 0752 12/13/14 0736  GLUCAP 92 102* 90   Hgb A1c  Recent Labs  12/10/14 2245  HGBA1C 5.3   Thyroid function studies  Recent Labs  12/10/14 2245  TSH 1.437   Microbiology Recent Results (from the past 240 hour(s))  Urine culture     Status: None   Collection Time: 12/10/14  5:54 PM  Result Value Ref Range Status   Specimen Description URINE, CLEAN CATCH  Final   Special Requests Immunocompromised  Final   Colony Count NO GROWTH Performed at [facility]   Final   Culture NO GROWTH Performed at [facility]   Final   Report Status 12/12/2014 FINAL  Final  Rapid strep screen     Status: None   Collection Time: 12/10/14  7:12 PM  Result Value Ref Range Status   Streptococcus, Group A Screen (Direct) NEGATIVE NEGATIVE Final    Comment: (NOTE) A Rapid Antigen test may result negative if the antigen level in the sample is below the detection level of this test. The FDA has  not cleared this test as a stand-alone test therefore the rapid antigen negative result has reflexed to a Group A Strep culture.   Culture, blood (x 2)     Status: None (Preliminary result)   Collection Time: 12/10/14  9:55 PM  Result Value Ref Range Status   Specimen Description BLOOD RIGHT ANTECUBITAL  Final   Special Requests BOTTLES DRAWN AEROBIC AND ANAEROBIC 5ML  Final   Culture   Final           BLOOD CULTURE RECEIVED NO GROWTH TO DATE CULTURE WILL BE HELD FOR 5 DAYS BEFORE ISSUING A FINAL NEGATIVE REPORT Performed at [facility]    Report Status PENDING  Incomplete  Culture, blood (x 2)     Status: None (Preliminary result)   Collection Time: 12/10/14 10:45 PM  Result Value Ref Range Status   Specimen Description BLOOD L HAND  Final   Special Requests BOTTLES DRAWN AEROBIC AND ANAEROBIC 3ML  Final   Culture   Final           BLOOD CULTURE RECEIVED NO GROWTH TO DATE CULTURE WILL BE HELD FOR 5 DAYS BEFORE ISSUING A FINAL NEGATIVE REPORT Performed at [facility]    Report Status PENDING  Incomplete  MRSA PCR Screening     Status: None   Collection Time: 12/11/14 12:05 PM  Result Value Ref Range Status   MRSA by PCR NEGATIVE NEGATIVE Final    Comment:        The GeneXpert MRSA Assay (FDA approved for NASAL specimens only), is one component of a comprehensive MRSA colonization surveillance program. It is not intended to diagnose MRSA infection nor to guide or monitor treatment for MRSA infections.      Discharge Instructions:   Discharge Instructions    Call MD for:  extreme fatigue    Complete by:  As directed      Call MD for:  persistant nausea and vomiting    Complete by:  As directed      Call MD for:  severe uncontrolled pain    Complete by:  As directed      Call MD for:  temperature >100.4    Complete by:  As directed      Diet - low sodium heart healthy    Complete by:  As directed      Discharge instructions    Complete by:  As  directed   You were cared for by Dr. Jacquelynn Cree  (a hospitalist) during your hospital stay. If you have any questions about your discharge medications or the  care you received while you were in the hospital after you are discharged, you can call the unit and ask to speak with the hospitalist on call if the hospitalist that took care of you is not available. Once you are discharged, your primary care physician will handle any further medical issues. Please note that NO REFILLS for any discharge medications will be authorized once you are discharged, as it is imperative that you return to your primary care physician (or establish a relationship with a primary care physician if you do not have one) for your aftercare needs so that they can reassess your need for medications and monitor your lab values.  Any outstanding tests can be reviewed by your PCP at your follow up visit.  It is also important to review any medicine changes with your PCP.  Please bring these d/c instructions with you to your next visit so your physician can review these changes with you.  If you do not have a primary care physician, you can call [phone] for a physician referral.  It is highly recommended that you obtain a PCP for hospital follow up.     Increase activity slowly    Complete by:  As directed             Medication List    STOP taking these medications        silver sulfADIAZINE 1 % cream  Commonly known as:  SILVADENE      TAKE these medications        Abacavir-Dolutegravir-Lamivud 600-50-300 MG Tabs  Take 1 tablet by mouth daily.     atorvastatin 10 MG tablet  Commonly known as:  LIPITOR  Take 10 mg by mouth daily.     docusate sodium 100 MG capsule  Commonly known as:  COLACE  Take 1 capsule (100 mg total) by mouth 2 (two) times daily.     feeding supplement (ENSURE COMPLETE) Liqd  Take 237 mLs by mouth 3 (three) times daily between meals.     ferrous sulfate 325 (65 FE) MG tablet  TAKE 1  TABLET (325 MG TOTAL) BY MOUTH TWO   (TWO) TIMES DAILY WITH A MEAL.     metoprolol tartrate 25 MG tablet  Commonly known as:  LOPRESSOR  Take 1 tablet (25 mg total) by mouth 2 (two) times daily.     oxyCODONE 5 MG immediate release tablet  Commonly known as:  ROXICODONE  Take 1 tablet (5 mg total) by mouth every 4 (four) hours as needed for severe pain.     ticagrelor 90 MG Tabs tablet  Commonly known as:  BRILINTA  Take 1 tablet (90 mg total) by mouth 2 (two) times daily.           Follow-up Information    Follow up with Dutch Gray, MD.   Specialty:  Urology   Why:  Call to schedule an appointment in about 1 week   Contact information:   [address] [place] [address] [phone]       Schedule an appointment as soon as possible for a visit with Scharlene Gloss, MD.   Specialty:  Infectious Diseases   Why:  Every 3 months, at your scheduled appt time   Contact information:   [address] [address] [phone]        Time coordinating discharge: 35 minutes.  Signed:  Rossana Molchan  Pager [phone] [facility] 12/13/2014, 9:32 AM

## 2014-12-14 ENCOUNTER — Encounter (HOSPITAL_COMMUNITY): Payer: Self-pay | Admitting: Urology

## 2014-12-16 ENCOUNTER — Encounter: Payer: Self-pay | Admitting: Oncology

## 2014-12-16 NOTE — Progress Notes (Signed)
I placed fmla forms for mother germaine on desk of nurse for dr. Alen Blew.

## 2014-12-16 NOTE — Progress Notes (Signed)
I called and left mother

## 2014-12-16 NOTE — Progress Notes (Signed)
I called and left Germaine(mom) a message her fmla forms were ready and upfront with Ms. Wilma at desk.

## 2014-12-17 LAB — CULTURE, BLOOD (ROUTINE X 2)
CULTURE: NO GROWTH
Culture: NO GROWTH

## 2014-12-29 ENCOUNTER — Other Ambulatory Visit: Payer: Medicare Other

## 2015-01-01 ENCOUNTER — Telehealth: Payer: Self-pay | Admitting: Oncology

## 2015-01-01 ENCOUNTER — Other Ambulatory Visit (HOSPITAL_BASED_OUTPATIENT_CLINIC_OR_DEPARTMENT_OTHER): Payer: Medicare Other

## 2015-01-01 ENCOUNTER — Ambulatory Visit (HOSPITAL_BASED_OUTPATIENT_CLINIC_OR_DEPARTMENT_OTHER): Payer: Medicare Other | Admitting: Oncology

## 2015-01-01 VITALS — BP 96/62 | HR 83 | Temp 97.7°F | Resp 18 | Ht 73.0 in | Wt 163.3 lb

## 2015-01-01 DIAGNOSIS — C8190 Hodgkin lymphoma, unspecified, unspecified site: Secondary | ICD-10-CM

## 2015-01-01 DIAGNOSIS — D649 Anemia, unspecified: Secondary | ICD-10-CM | POA: Diagnosis not present

## 2015-01-01 DIAGNOSIS — B20 Human immunodeficiency virus [HIV] disease: Secondary | ICD-10-CM

## 2015-01-01 DIAGNOSIS — C8171 Other classical Hodgkin lymphoma, lymph nodes of head, face, and neck: Secondary | ICD-10-CM

## 2015-01-01 LAB — CBC WITH DIFFERENTIAL/PLATELET
BASO%: 0.2 % (ref 0.0–2.0)
Basophils Absolute: 0 10*3/uL (ref 0.0–0.1)
EOS%: 1.3 % (ref 0.0–7.0)
Eosinophils Absolute: 0.1 10*3/uL (ref 0.0–0.5)
HCT: 43.7 % (ref 38.4–49.9)
HEMOGLOBIN: 14.7 g/dL (ref 13.0–17.1)
LYMPH%: 46.1 % (ref 14.0–49.0)
MCH: 36 pg — AB (ref 27.2–33.4)
MCHC: 33.6 g/dL (ref 32.0–36.0)
MCV: 107.1 fL — ABNORMAL HIGH (ref 79.3–98.0)
MONO#: 0.4 10*3/uL (ref 0.1–0.9)
MONO%: 7.8 % (ref 0.0–14.0)
NEUT%: 44.6 % (ref 39.0–75.0)
NEUTROS ABS: 2 10*3/uL (ref 1.5–6.5)
PLATELETS: 189 10*3/uL (ref 140–400)
RBC: 4.08 10*6/uL — ABNORMAL LOW (ref 4.20–5.82)
RDW: 13.5 % (ref 11.0–14.6)
WBC: 4.5 10*3/uL (ref 4.0–10.3)
lymph#: 2.1 10*3/uL (ref 0.9–3.3)

## 2015-01-01 LAB — COMPREHENSIVE METABOLIC PANEL (CC13)
ALT: 10 U/L (ref 0–55)
ANION GAP: 9 meq/L (ref 3–11)
AST: 14 U/L (ref 5–34)
Albumin: 3.8 g/dL (ref 3.5–5.0)
Alkaline Phosphatase: 199 U/L — ABNORMAL HIGH (ref 40–150)
BILIRUBIN TOTAL: 0.51 mg/dL (ref 0.20–1.20)
BUN: 14.2 mg/dL (ref 7.0–26.0)
CO2: 22 mEq/L (ref 22–29)
Calcium: 9.2 mg/dL (ref 8.4–10.4)
Chloride: 110 mEq/L — ABNORMAL HIGH (ref 98–109)
Creatinine: 2.4 mg/dL — ABNORMAL HIGH (ref 0.7–1.3)
EGFR: 41 mL/min/{1.73_m2} — ABNORMAL LOW (ref >90)
GLUCOSE: 88 mg/dL (ref 70–140)
POTASSIUM: 3.9 meq/L (ref 3.5–5.1)
SODIUM: 141 meq/L (ref 136–145)
Total Protein: 8.1 g/dL (ref 6.4–8.3)

## 2015-01-01 NOTE — Telephone Encounter (Signed)
Gave adn printed appt sched adn avs for pt for DEC °

## 2015-01-01 NOTE — Progress Notes (Signed)
Hematology and Oncology Follow Up Visit  Thomas [name] [id number] 08/17/1980 34 y.o. 01/01/2015 1:30 PM   Principle Diagnosis:  34 year old HIV positive gentleman diagnosed with Hodgkin's disease in January of 2015 after he presented with anemia and diffuse lymphadenopathy. Stage IIIA disease.    Prior Therapy:  He is status post supraclavicular right lymph node biopsy done on 08/02/2013. Systemic chemotherapy in the form of ABVD started on 10/03/2013. He S/P 5 cycles last of which given in July 2015. Therapy discontinued due to sepsis, kidney surgery and myocardial infarction.   Current therapy: Observation and surveillance.  Interim History:  Thomas [name] presents today for a followup visit with his mother. Since his last visit, he continues to do well without any symptoms. He has not reported any lymphadenopathy or constitutional symptoms. He has not reported any chest pain or difficulty breathing. He did have a recent hospitalization for hydronephrosis that required a cystoscopy and a lithotripsy for recurrent nephrolithiasis. He has recovered well at this time.He has been doing well since that time without any evidence to suggest recurrent lymphoma. He continues to do well and gaining weight.   He denies any nausea, vomiting or abdominal pain. He does not report any chest pain or shortness of breath. He denies fevers or chills or sweats. Denied pruritus. Denied hematochezia or melena or any bleeding problems. He does not report any headaches or blurry vision or double vision. Did not report any alteration of mental status her psychiatric issues. As that report any syncope or seizures. Does not report any chest pain palpitation or difficulty breathing. Does not report any leg edema or orthopnea. He does not report any abdominal distention or early satiety. As that report any ascites. Does not report any musculoskeletal complaints. Does not report any arthralgias or myalgias or skin rashes or  lymphadenopathy. Is not reporting any petechiae rashes or bleeding. Rest of his review of system is unremarkable.  Medications: I have reviewed the patient's current medications. Unchanged by my review today Current Outpatient Prescriptions  Medication Sig Dispense Refill  . Abacavir-Dolutegravir-Lamivud 600-50-300 MG TABS Take 1 tablet by mouth daily.     Marland Kitchen atorvastatin (LIPITOR) 10 MG tablet Take 10 mg by mouth daily.     Marland Kitchen docusate sodium (COLACE) 100 MG capsule Take 1 capsule (100 mg total) by mouth 2 (two) times daily. 30 capsule 0  . feeding supplement, ENSURE COMPLETE, (ENSURE COMPLETE) LIQD Take 237 mLs by mouth 3 (three) times daily between meals. 237 mL 6  . ferrous sulfate 325 (65 FE) MG tablet TAKE 1 TABLET (325 MG TOTAL) BY MOUTH TWO   (TWO) TIMES DAILY WITH A MEAL. 60 tablet 0  . metoprolol tartrate (LOPRESSOR) 25 MG tablet Take 1 tablet (25 mg total) by mouth 2 (two) times daily. 60 tablet 1  . oxyCODONE (ROXICODONE) 5 MG immediate release tablet Take 1 tablet (5 mg total) by mouth every 4 (four) hours as needed for severe pain. 30 tablet 0  . ticagrelor (BRILINTA) 90 MG TABS tablet Take 1 tablet (90 mg total) by mouth 2 (two) times daily. 60 tablet 6   No current facility-administered medications for this visit.   Facility-Administered Medications Ordered in Other Visits  Medication Dose Route Frequency Provider Last Rate Last Dose  . sodium chloride 0.9 % injection 10 mL  10 mL Intravenous PRN Wyatt Portela, MD   10 mL at 10/31/13 1601     Allergies:  Allergies  Allergen Reactions  . Tylenol [Acetaminophen] Other (  See Comments)    sweats    Past Medical History, Surgical history, Social history, and Family History were reviewed and updated.    Physical Exam: Blood pressure 96/62, pulse 83, temperature 97.7 F (36.5 C), temperature source Oral, resp. rate 18, height 6\' 1"  (1.854 m), weight 163 lb 4.8 oz (74.072 kg), SpO2 100 %.  ECOG: 1  General appearance:  alert and cooperative not in any distress. Well-appearing gentleman. Head: Normocephalic, without obvious abnormality, atraumatic Neck: no carotid bruit no thyroid masses or lymphadenopathy. Lymph nodes: No lymphadenopathy noted.  Heart:regular rate and rhythm, S1, S2 normal, no murmur, click, rub or gallop Lung:chest clear, no wheezing, rales, normal symmetric air entry Abdomin: soft, non-tender, without masses or organomegaly EXT:no erythema, induration, or nodules Skin: Dry skin noted. Neurological exam: No motor or since deficits.    Lab Results: Lab Results  Component Value Date   WBC 4.5 01/01/2015   HGB 14.7 01/01/2015   HCT 43.7 01/01/2015   MCV 107.1* 01/01/2015   PLT 189 01/01/2015     Chemistry      Component Value Date/Time   NA 141 12/13/2014 0400   NA 141 10/09/2014 1506   K 3.5 12/13/2014 0400   K 3.9 10/09/2014 1506   CL 113* 12/13/2014 0400   CO2 20* 12/13/2014 0400   CO2 20* 10/09/2014 1506   BUN 15 12/13/2014 0400   BUN 17.5 10/09/2014 1506   CREATININE 2.38* 12/13/2014 0400   CREATININE 3.0* 10/09/2014 1506   CREATININE 2.06* 06/24/2014 1202      Component Value Date/Time   CALCIUM 7.9* 12/13/2014 0400   CALCIUM 8.7 10/09/2014 1506   ALKPHOS 105 12/11/2014 0526   ALKPHOS 199* 10/09/2014 1506   AST 9* 12/11/2014 0526   AST 11 10/09/2014 1506   ALT 10* 12/11/2014 0526   ALT 11 10/09/2014 1506   BILITOT 0.4 12/11/2014 0526   BILITOT 0.28 10/09/2014 1506     EXAM: NUCLEAR MEDICINE PET SKULL BASE TO THIGH  TECHNIQUE: 8.5 mCi F-18 FDG was injected intravenously. Full-ring PET imaging was performed from the skull base to thigh after the radiotracer. CT data was obtained and used for attenuation correction and anatomic localization.  FASTING BLOOD GLUCOSE: Value: 85 mg/dl  COMPARISON: None.  FINDINGS: NECK  No hypermetabolic lymph nodes in the neck.  CHEST  No hypermetabolic mediastinal or hilar nodes. No suspicious pulmonary  nodules on the CT scan.  ABDOMEN/PELVIS  No abnormal hypermetabolic activity within the liver, pancreas, adrenal glands, or spleen. No hypermetabolic lymph nodes in the abdomen or pelvis. There are small periaortic lymph nodes which do not have measurable metabolic activity. The spleen is normal volume with normal metabolic activity.  There is persistent hydronephrosis and hydroureter of the left kidney and collecting systems secondary to an obstructing calculus at the pelvic brim measuring 8 mm within the left ureter.  SKELETON  No focal hypermetabolic activity to suggest skeletal metastasis.  IMPRESSION: 1. Complete response to chemo therapy with no residual metabolic activity within the lymph nodes. ( [place] 1) 2. Persistent chronic left hydronephrosis and hydroureter secondary to obstructing ureteral calculus.  Impression and Plan:  34 year old gentleman with the following issues:  1. Hodgkin's disease presented with lymphadenopathy above and below the diaphragm as well as bone marrow involvement without any constitutional symptoms indicating at least stage IIIA. He is status post 5 cycles of ABVD chemotherapy interrupted due to acute illness outlined above. That included a sepsis, kidney operation and cardiac arrest.  PET CT scan on 11/06/2058 was reviewed today and showed no evidence of recurrent disease. At this time he continues to be in remission and the likelihood of relapse diminishes dramatically at this time probably around 10-15%. The breast continue active surveillance and repeat laboratory testing and physical examination in 6 months. We will repeat imaging studies as needed.   2. Anemia: Likely related to Hodgkin's disease. His hemoglobin is near normal at this time.  3. IV access: Port-A-Cath has been removed at this time.  4. HIV: He is on HIV medication and followed by infectious disease service.   5. Renal insufficiency: His creatinine has  improved after his lithotripsy and will be repeated today.  6. Followup: Will be in 6 months sooner if if needed to.   Zola Button, MD 6/10/20161:30 PM

## 2015-01-05 ENCOUNTER — Other Ambulatory Visit: Payer: Medicare Other

## 2015-01-05 DIAGNOSIS — Z79899 Other long term (current) drug therapy: Secondary | ICD-10-CM

## 2015-01-05 DIAGNOSIS — B20 Human immunodeficiency virus [HIV] disease: Secondary | ICD-10-CM

## 2015-01-05 DIAGNOSIS — Z113 Encounter for screening for infections with a predominantly sexual mode of transmission: Secondary | ICD-10-CM

## 2015-01-05 LAB — COMPLETE METABOLIC PANEL WITH GFR
ALT: 12 U/L (ref 0–53)
AST: 14 U/L (ref 0–37)
Albumin: 4.2 g/dL (ref 3.5–5.2)
Alkaline Phosphatase: 202 U/L — ABNORMAL HIGH (ref 39–117)
BILIRUBIN TOTAL: 0.3 mg/dL (ref 0.2–1.2)
BUN: 23 mg/dL (ref 6–23)
CO2: 22 mEq/L (ref 19–32)
Calcium: 9.1 mg/dL (ref 8.4–10.5)
Chloride: 107 mEq/L (ref 96–112)
Creat: 2.35 mg/dL — ABNORMAL HIGH (ref 0.50–1.35)
GFR, EST AFRICAN AMERICAN: 41 mL/min — AB
GFR, Est Non African American: 36 mL/min — ABNORMAL LOW
Glucose, Bld: 68 mg/dL — ABNORMAL LOW (ref 70–99)
Potassium: 4.3 mEq/L (ref 3.5–5.3)
SODIUM: 138 meq/L (ref 135–145)
TOTAL PROTEIN: 7.6 g/dL (ref 6.0–8.3)

## 2015-01-05 LAB — CBC WITH DIFFERENTIAL/PLATELET
Basophils Absolute: 0 10*3/uL (ref 0.0–0.1)
Basophils Relative: 0 % (ref 0–1)
EOS ABS: 0.1 10*3/uL (ref 0.0–0.7)
Eosinophils Relative: 2 % (ref 0–5)
HEMATOCRIT: 41.1 % (ref 39.0–52.0)
Hemoglobin: 13.9 g/dL (ref 13.0–17.0)
Lymphocytes Relative: 36 % (ref 12–46)
Lymphs Abs: 1.7 10*3/uL (ref 0.7–4.0)
MCH: 35 pg — ABNORMAL HIGH (ref 26.0–34.0)
MCHC: 33.8 g/dL (ref 30.0–36.0)
MCV: 103.5 fL — AB (ref 78.0–100.0)
MONO ABS: 0.4 10*3/uL (ref 0.1–1.0)
MONOS PCT: 9 % (ref 3–12)
MPV: 10.4 fL (ref 8.6–12.4)
NEUTROS ABS: 2.4 10*3/uL (ref 1.7–7.7)
Neutrophils Relative %: 53 % (ref 43–77)
Platelets: 200 10*3/uL (ref 150–400)
RBC: 3.97 MIL/uL — ABNORMAL LOW (ref 4.22–5.81)
RDW: 13.9 % (ref 11.5–15.5)
WBC: 4.6 10*3/uL (ref 4.0–10.5)

## 2015-01-05 LAB — LIPID PANEL
Cholesterol: 131 mg/dL (ref 0–200)
HDL: 38 mg/dL — AB (ref >=40)
LDL CALC: 64 mg/dL (ref 0–99)
Total CHOL/HDL Ratio: 3.4 Ratio
Triglycerides: 145 mg/dL (ref <150)
VLDL: 29 mg/dL (ref 0–40)

## 2015-01-06 LAB — RPR

## 2015-01-06 LAB — T-HELPER CELL (CD4) - (RCID CLINIC ONLY)
CD4 % Helper T Cell: 24 % — ABNORMAL LOW (ref 33–55)
CD4 T CELL ABS: 370 /uL — AB (ref 400–2700)

## 2015-01-06 LAB — HIV-1 RNA QUANT-NO REFLEX-BLD
HIV 1 RNA Quant: <20 copies/mL (ref <20)
HIV-1 RNA Quant, Log: <1.3 {Log} (ref <1.30)

## 2015-01-26 ENCOUNTER — Ambulatory Visit: Payer: Medicare Other | Admitting: Internal Medicine

## 2015-01-28 ENCOUNTER — Other Ambulatory Visit: Payer: Self-pay | Admitting: *Deleted

## 2015-01-28 DIAGNOSIS — E43 Unspecified severe protein-calorie malnutrition: Secondary | ICD-10-CM

## 2015-01-28 MED ORDER — ENSURE COMPLETE PO LIQD: 237.0000 mL | Freq: Three times a day (TID) | ORAL | Status: DC

## 2015-01-28 NOTE — Telephone Encounter (Signed)
THP refill for Ensure.  Signed and given to [place].

## 2015-02-16 DIAGNOSIS — N209 Urinary calculus, unspecified: Secondary | ICD-10-CM | POA: Diagnosis not present

## 2015-03-05 DIAGNOSIS — I2510 Atherosclerotic heart disease of native coronary artery without angina pectoris: Secondary | ICD-10-CM | POA: Diagnosis not present

## 2015-03-05 DIAGNOSIS — N184 Chronic kidney disease, stage 4 (severe): Secondary | ICD-10-CM | POA: Diagnosis not present

## 2015-03-05 DIAGNOSIS — Z8571 Personal history of Hodgkin lymphoma: Secondary | ICD-10-CM | POA: Diagnosis not present

## 2015-03-05 DIAGNOSIS — B20 Human immunodeficiency virus [HIV] disease: Secondary | ICD-10-CM | POA: Diagnosis not present

## 2015-04-15 ENCOUNTER — Other Ambulatory Visit: Payer: Self-pay | Admitting: Internal Medicine

## 2015-06-08 DIAGNOSIS — B20 Human immunodeficiency virus [HIV] disease: Secondary | ICD-10-CM | POA: Diagnosis not present

## 2015-06-08 DIAGNOSIS — I2510 Atherosclerotic heart disease of native coronary artery without angina pectoris: Secondary | ICD-10-CM | POA: Diagnosis not present

## 2015-06-08 DIAGNOSIS — N184 Chronic kidney disease, stage 4 (severe): Secondary | ICD-10-CM | POA: Diagnosis not present

## 2015-06-08 DIAGNOSIS — Z8571 Personal history of Hodgkin lymphoma: Secondary | ICD-10-CM | POA: Diagnosis not present

## 2015-07-02 ENCOUNTER — Ambulatory Visit: Payer: Medicare Other | Admitting: Oncology

## 2015-07-02 ENCOUNTER — Other Ambulatory Visit: Payer: Medicare Other

## 2015-07-08 ENCOUNTER — Telehealth: Payer: Self-pay | Admitting: Oncology

## 2015-07-08 NOTE — Telephone Encounter (Signed)
mother cld to r/s pt no show appt-gave r/s time & date-mother understood

## 2015-08-04 ENCOUNTER — Other Ambulatory Visit: Payer: Medicare Other

## 2015-08-04 ENCOUNTER — Ambulatory Visit: Payer: Medicare Other | Admitting: Oncology

## 2015-08-05 ENCOUNTER — Telehealth: Payer: Self-pay | Admitting: Oncology

## 2015-08-05 NOTE — Telephone Encounter (Signed)
Patient's mother called to r/s 1/11 lab/fu - mom has new appointment for 1/19.

## 2015-08-12 ENCOUNTER — Other Ambulatory Visit: Payer: Medicare Other

## 2015-08-12 ENCOUNTER — Ambulatory Visit: Payer: Medicare Other | Admitting: Oncology

## 2015-08-19 ENCOUNTER — Telehealth: Payer: Self-pay | Admitting: Oncology

## 2015-08-19 NOTE — Telephone Encounter (Signed)
Pt's mom called needed to r/s 01/19 missed apt, she needed afternoon for her to bring him after she got off work, confirmed labs/ov... KJ

## 2015-09-28 ENCOUNTER — Ambulatory Visit: Payer: Medicare Other | Admitting: Oncology

## 2015-09-28 ENCOUNTER — Other Ambulatory Visit: Payer: Medicare Other

## 2015-10-07 ENCOUNTER — Ambulatory Visit (HOSPITAL_BASED_OUTPATIENT_CLINIC_OR_DEPARTMENT_OTHER): Payer: Medicare Other | Admitting: Oncology

## 2015-10-07 ENCOUNTER — Other Ambulatory Visit (HOSPITAL_BASED_OUTPATIENT_CLINIC_OR_DEPARTMENT_OTHER): Payer: Medicare Other

## 2015-10-07 ENCOUNTER — Telehealth: Payer: Self-pay | Admitting: Oncology

## 2015-10-07 VITALS — BP 115/71 | HR 68 | Temp 98.2°F | Resp 18 | Ht 73.0 in | Wt 169.7 lb

## 2015-10-07 DIAGNOSIS — B20 Human immunodeficiency virus [HIV] disease: Secondary | ICD-10-CM | POA: Diagnosis not present

## 2015-10-07 DIAGNOSIS — C8198 Hodgkin lymphoma, unspecified, lymph nodes of multiple sites: Secondary | ICD-10-CM | POA: Diagnosis not present

## 2015-10-07 DIAGNOSIS — C8113 Nodular sclerosis classical Hodgkin lymphoma, intra-abdominal lymph nodes: Secondary | ICD-10-CM

## 2015-10-07 DIAGNOSIS — N289 Disorder of kidney and ureter, unspecified: Secondary | ICD-10-CM | POA: Diagnosis not present

## 2015-10-07 DIAGNOSIS — D649 Anemia, unspecified: Secondary | ICD-10-CM

## 2015-10-07 DIAGNOSIS — C8190 Hodgkin lymphoma, unspecified, unspecified site: Secondary | ICD-10-CM

## 2015-10-07 LAB — COMPREHENSIVE METABOLIC PANEL
ALBUMIN: 3.9 g/dL (ref 3.5–5.0)
ALK PHOS: 91 U/L (ref 40–150)
ALT: 17 U/L (ref 0–55)
AST: 16 U/L (ref 5–34)
Anion Gap: 6 mEq/L (ref 3–11)
BUN: 20.8 mg/dL (ref 7.0–26.0)
CO2: 23 mEq/L (ref 22–29)
CREATININE: 2.3 mg/dL — AB (ref 0.7–1.3)
Calcium: 9 mg/dL (ref 8.4–10.4)
Chloride: 111 mEq/L — ABNORMAL HIGH (ref 98–109)
EGFR: 42 mL/min/{1.73_m2} — ABNORMAL LOW (ref >90)
Glucose: 93 mg/dl (ref 70–140)
POTASSIUM: 4.2 meq/L (ref 3.5–5.1)
SODIUM: 141 meq/L (ref 136–145)
Total Bilirubin: 0.34 mg/dL (ref 0.20–1.20)
Total Protein: 8.1 g/dL (ref 6.4–8.3)

## 2015-10-07 LAB — CBC WITH DIFFERENTIAL/PLATELET
BASO%: 1 % (ref 0.0–2.0)
Basophils Absolute: 0 10*3/uL (ref 0.0–0.1)
EOS%: 0.6 % (ref 0.0–7.0)
Eosinophils Absolute: 0 10*3/uL (ref 0.0–0.5)
HCT: 46.6 % (ref 38.4–49.9)
HGB: 15.1 g/dL (ref 13.0–17.1)
LYMPH%: 40.5 % (ref 14.0–49.0)
MCH: 34.8 pg — ABNORMAL HIGH (ref 27.2–33.4)
MCHC: 32.5 g/dL (ref 32.0–36.0)
MCV: 106.9 fL — ABNORMAL HIGH (ref 79.3–98.0)
MONO#: 0.3 10*3/uL (ref 0.1–0.9)
MONO%: 7.3 % (ref 0.0–14.0)
NEUT%: 50.6 % (ref 39.0–75.0)
NEUTROS ABS: 2.2 10*3/uL (ref 1.5–6.5)
Platelets: 179 10*3/uL (ref 140–400)
RBC: 4.36 10*6/uL (ref 4.20–5.82)
RDW: 12.8 % (ref 11.0–14.6)
WBC: 4.3 10*3/uL (ref 4.0–10.3)
lymph#: 1.7 10*3/uL (ref 0.9–3.3)

## 2015-10-07 NOTE — Telephone Encounter (Signed)
per pof to sch pt appt-gave pt copy of avs °

## 2015-10-07 NOTE — Progress Notes (Signed)
Hematology and Oncology Follow Up Visit  Thomas [name] [id number] 01/25/1981 35 y.o. 10/07/2015 3:06 PM   Principle Diagnosis:  35 year old HIV positive gentleman diagnosed with Hodgkin's disease in January of 2015 after he presented with anemia and diffuse lymphadenopathy. Stage IIIA disease.    Prior Therapy:  He is status post supraclavicular right lymph node biopsy done on 08/02/2013. Systemic chemotherapy in the form of ABVD started on 10/03/2013. He S/P 5 cycles last of which given in July 2015. Therapy discontinued due to sepsis, kidney surgery and myocardial infarction.   Current therapy: Observation and surveillance.  Interim History:  Mr. Cooling presents today for a followup visit with his mother. Since his last visit, he reports no recent complaints. He continues to feel well and reports no illnesses or hospitalizations. He has not reported any lymphadenopathy or constitutional symptoms. He has not reported any chest pain or difficulty breathing. He denied any fevers or chills. His weight continued to be stable.    He does not report any headaches, blurry vision, syncope or seizures. He denies any nausea, vomiting or abdominal pain. He does not report any chest pain or shortness of breath. He denies fevers or chills or sweats. Denied pruritus. Denied hematochezia or melena or any bleeding problems. He does not report any abdominal distention or early satiety. As that report any ascites. Does not report any musculoskeletal complaints. Does not report any arthralgias or myalgias or skin rashes or lymphadenopathy. Is not reporting any petechiae rashes or bleeding. Rest of his review of system is unremarkable.  Medications: I have reviewed the patient's current medications. Unchanged by my review today Current Outpatient Prescriptions  Medication Sig Dispense Refill  . Abacavir-Dolutegravir-Lamivud 600-50-300 MG TABS Take 1 tablet by mouth daily.     Marland Kitchen atorvastatin (LIPITOR) 10 MG  tablet Take 10 mg by mouth daily.     Marland Kitchen docusate sodium (COLACE) 100 MG capsule Take 1 capsule (100 mg total) by mouth 2 (two) times daily. 30 capsule 0  . feeding supplement, ENSURE COMPLETE, (ENSURE COMPLETE) LIQD Take 237 mLs by mouth 3 (three) times daily between meals. 237 mL 6  . ferrous sulfate 325 (65 FE) MG tablet TAKE 1 TABLET (325 MG TOTAL) BY MOUTH TWO   (TWO) TIMES DAILY WITH A MEAL. 60 tablet 0  . metoprolol tartrate (LOPRESSOR) 25 MG tablet Take 1 tablet (25 mg total) by mouth 2 (two) times daily. 60 tablet 1  . oxyCODONE (ROXICODONE) 5 MG immediate release tablet Take 1 tablet (5 mg total) by mouth every 4 (four) hours as needed for severe pain. 30 tablet 0  . ticagrelor (BRILINTA) 90 MG TABS tablet Take 1 tablet (90 mg total) by mouth 2 (two) times daily. 60 tablet 6  . TRIUMEQ 600-50-300 MG TABS TAKE 1 TABLET BY MOUTH DAILY. 30 tablet 6   No current facility-administered medications for this visit.   Facility-Administered Medications Ordered in Other Visits  Medication Dose Route Frequency Provider Last Rate Last Dose  . sodium chloride 0.9 % injection 10 mL  10 mL Intravenous PRN Wyatt Portela, MD   10 mL at 10/31/13 1601     Allergies:  Allergies  Allergen Reactions  . Tylenol [Acetaminophen] Other (See Comments)    sweats    Past Medical History, Surgical history, Social history, and Family History were reviewed and updated.    Physical Exam: Blood pressure 115/71, pulse 68, temperature 98.2 F (36.8 C), temperature source Oral, resp. rate 18, height 6\' 1"  (1.854  m), weight 169 lb 11.2 oz (76.975 kg), SpO2 100 %.  ECOG: 0  General appearance: alert and cooperative well appearing gentleman without distress. Head: Normocephalic, without obvious abnormality Neck: no carotid bruit no thyroid masses or lymphadenopathy. Lymph nodes: No lymphadenopathy noted.  Heart:regular rate and rhythm, S1, S2 normal, no murmur, click, rub or gallop Lung:chest clear, no  wheezing, rales, normal symmetric air entry Abdomin: soft, non-tender, without masses or organomegaly EXT:no erythema, induration, or nodules Skin: No rashes or lesions. Neurological exam: No motor or since deficits.    Lab Results: Lab Results  Component Value Date   WBC 4.3 10/07/2015   HGB 15.1 10/07/2015   HCT 46.6 10/07/2015   MCV 106.9* 10/07/2015   PLT 179 10/07/2015     Chemistry      Component Value Date/Time   NA 138 01/05/2015 1457   NA 141 01/01/2015 1308   K 4.3 01/05/2015 1457   K 3.9 01/01/2015 1308   CL 107 01/05/2015 1457   CO2 22 01/05/2015 1457   CO2 22 01/01/2015 1308   BUN 23 01/05/2015 1457   BUN 14.2 01/01/2015 1308   CREATININE 2.35* 01/05/2015 1457   CREATININE 2.4* 01/01/2015 1308   CREATININE 2.38* 12/13/2014 0400      Component Value Date/Time   CALCIUM 9.1 01/05/2015 1457   CALCIUM 9.2 01/01/2015 1308   ALKPHOS 202* 01/05/2015 1457   ALKPHOS 199* 01/01/2015 1308   AST 14 01/05/2015 1457   AST 14 01/01/2015 1308   ALT 12 01/05/2015 1457   ALT 10 01/01/2015 1308   BILITOT 0.3 01/05/2015 1457   BILITOT 0.51 01/01/2015 536     35 year old gentleman with the following issues:  1. Hodgkin's disease presented with lymphadenopathy above and below the diaphragm as well as bone marrow involvement without any constitutional symptoms indicating at least stage IIIA. He is status post 5 cycles of ABVD chemotherapy interrupted due to acute illness outlined above. That included a sepsis, kidney operation and cardiac arrest.   PET CT scan on 11/06/2014  showed no evidence of recurrent disease. He continues to be on active surveillance at this time. His physical examination and laboratory data do not support a diagnosis of recurrent disease. The plan is to continue with active surveillance every 6 months and repeat imaging studies as needed.   2. Anemia: Likely related to Hodgkin's disease. His hemoglobin is normal.  3. HIV: He is on HIV medication  and followed by infectious disease service.   4. Renal insufficiency: His creatinine is pending from today but appears to be close to baseline.  6. Followup: Will be in 6 months sooner if needed to.   [facility], MD 3/16/20173:06 PM

## 2015-10-27 ENCOUNTER — Other Ambulatory Visit: Payer: Self-pay

## 2015-10-27 ENCOUNTER — Emergency Department (HOSPITAL_COMMUNITY): Payer: Medicare Other

## 2015-10-27 ENCOUNTER — Encounter (HOSPITAL_COMMUNITY): Payer: Self-pay | Admitting: Emergency Medicine

## 2015-10-27 ENCOUNTER — Inpatient Hospital Stay (HOSPITAL_COMMUNITY)
Admission: EM | Admit: 2015-10-27 | Discharge: 2015-10-29 | DRG: 280 | Disposition: A | Discharge status: Home / Self Care | Payer: Medicare Other | Attending: Cardiology | Admitting: Cardiology

## 2015-10-27 DIAGNOSIS — Z905 Acquired absence of kidney: Secondary | ICD-10-CM

## 2015-10-27 DIAGNOSIS — I214 Non-ST elevation (NSTEMI) myocardial infarction: Principal | ICD-10-CM | POA: Diagnosis present

## 2015-10-27 DIAGNOSIS — Z79891 Long term (current) use of opiate analgesic: Secondary | ICD-10-CM

## 2015-10-27 DIAGNOSIS — I252 Old myocardial infarction: Secondary | ICD-10-CM

## 2015-10-27 DIAGNOSIS — E785 Hyperlipidemia, unspecified: Secondary | ICD-10-CM | POA: Diagnosis present

## 2015-10-27 DIAGNOSIS — Z886 Allergy status to analgesic agent status: Secondary | ICD-10-CM | POA: Diagnosis not present

## 2015-10-27 DIAGNOSIS — R079 Chest pain, unspecified: Secondary | ICD-10-CM | POA: Diagnosis not present

## 2015-10-27 DIAGNOSIS — N184 Chronic kidney disease, stage 4 (severe): Secondary | ICD-10-CM | POA: Diagnosis present

## 2015-10-27 DIAGNOSIS — Z9481 Bone marrow transplant status: Secondary | ICD-10-CM

## 2015-10-27 DIAGNOSIS — B20 Human immunodeficiency virus [HIV] disease: Secondary | ICD-10-CM | POA: Diagnosis present

## 2015-10-27 DIAGNOSIS — Z955 Presence of coronary angioplasty implant and graft: Secondary | ICD-10-CM | POA: Diagnosis not present

## 2015-10-27 DIAGNOSIS — I2510 Atherosclerotic heart disease of native coronary artery without angina pectoris: Secondary | ICD-10-CM | POA: Diagnosis present

## 2015-10-27 DIAGNOSIS — I25111 Atherosclerotic heart disease of native coronary artery with angina pectoris with documented spasm: Secondary | ICD-10-CM | POA: Diagnosis present

## 2015-10-27 DIAGNOSIS — Z8571 Personal history of Hodgkin lymphoma: Secondary | ICD-10-CM | POA: Diagnosis not present

## 2015-10-27 DIAGNOSIS — Z79899 Other long term (current) drug therapy: Secondary | ICD-10-CM | POA: Diagnosis not present

## 2015-10-27 DIAGNOSIS — I25110 Atherosclerotic heart disease of native coronary artery with unstable angina pectoris: Secondary | ICD-10-CM | POA: Diagnosis not present

## 2015-10-27 DIAGNOSIS — Z8572 Personal history of non-Hodgkin lymphomas: Secondary | ICD-10-CM

## 2015-10-27 DIAGNOSIS — Z7982 Long term (current) use of aspirin: Secondary | ICD-10-CM

## 2015-10-27 DIAGNOSIS — Z87891 Personal history of nicotine dependence: Secondary | ICD-10-CM | POA: Diagnosis not present

## 2015-10-27 DIAGNOSIS — Z8674 Personal history of sudden cardiac arrest: Secondary | ICD-10-CM

## 2015-10-27 DIAGNOSIS — I249 Acute ischemic heart disease, unspecified: Secondary | ICD-10-CM | POA: Diagnosis present

## 2015-10-27 DIAGNOSIS — I200 Unstable angina: Secondary | ICD-10-CM | POA: Diagnosis not present

## 2015-10-27 DIAGNOSIS — R0789 Other chest pain: Secondary | ICD-10-CM | POA: Diagnosis not present

## 2015-10-27 LAB — BASIC METABOLIC PANEL
Anion gap: 8 (ref 5–15)
BUN: 17 mg/dL (ref 6–20)
CHLORIDE: 108 mmol/L (ref 101–111)
CO2: 26 mmol/L (ref 22–32)
Calcium: 8.9 mg/dL (ref 8.9–10.3)
Creatinine, Ser: 2.48 mg/dL — ABNORMAL HIGH (ref 0.61–1.24)
GFR calc Af Amer: 38 mL/min — ABNORMAL LOW (ref >60)
GFR calc non Af Amer: 33 mL/min — ABNORMAL LOW (ref >60)
GLUCOSE: 92 mg/dL (ref 65–99)
POTASSIUM: 4.3 mmol/L (ref 3.5–5.1)
Sodium: 142 mmol/L (ref 135–145)

## 2015-10-27 LAB — CBC WITH DIFFERENTIAL/PLATELET
BASOS PCT: 0 %
Basophils Absolute: 0 10*3/uL (ref 0.0–0.1)
EOS ABS: 0 10*3/uL (ref 0.0–0.7)
Eosinophils Relative: 1 %
HEMATOCRIT: 42.1 % (ref 39.0–52.0)
HEMOGLOBIN: 13.8 g/dL (ref 13.0–17.0)
LYMPHS ABS: 1.4 10*3/uL (ref 0.7–4.0)
LYMPHS PCT: 29 %
MCH: 34.3 pg — AB (ref 26.0–34.0)
MCHC: 32.8 g/dL (ref 30.0–36.0)
MCV: 104.7 fL — ABNORMAL HIGH (ref 78.0–100.0)
MONO ABS: 0.3 10*3/uL (ref 0.1–1.0)
MONOS PCT: 7 %
Neutro Abs: 3.1 10*3/uL (ref 1.7–7.7)
Neutrophils Relative %: 63 %
Platelets: 127 10*3/uL — ABNORMAL LOW (ref 150–400)
RBC: 4.02 MIL/uL — ABNORMAL LOW (ref 4.22–5.81)
RDW: 12.4 % (ref 11.5–15.5)
WBC: 4.8 10*3/uL (ref 4.0–10.5)

## 2015-10-27 LAB — I-STAT TROPONIN, ED
TROPONIN I, POC: 0.26 ng/mL — AB (ref 0.00–0.08)
Troponin i, poc: 0.15 ng/mL (ref 0.00–0.08)

## 2015-10-27 LAB — TROPONIN I: TROPONIN I: 0.22 ng/mL — AB (ref <0.031)

## 2015-10-27 LAB — D-DIMER, QUANTITATIVE: D-Dimer, Quant: <0.27 ug/mL-FEU (ref 0.00–0.50)

## 2015-10-27 MED ORDER — SODIUM CHLORIDE 0.9% FLUSH: 3.0000 mL | INTRAVENOUS | Status: DC | PRN

## 2015-10-27 MED ORDER — METOPROLOL TARTRATE 25 MG PO TABS
25.0000 mg | ORAL_TABLET | Freq: Two times a day (BID) | ORAL | Status: DC
  Administered 2015-10-28 – 2015-10-29 (×3): 25 mg via ORAL
  Filled 2015-10-27: qty 1
  Filled 2015-10-27: qty 2
  Filled 2015-10-27 (×2): qty 1

## 2015-10-27 MED ORDER — ENSURE ENLIVE PO LIQD: 237.0000 mL | Freq: Three times a day (TID) | ORAL | Status: DC

## 2015-10-27 MED ORDER — SODIUM CHLORIDE 0.9 % IV SOLN
250.0000 mL | INTRAVENOUS | Status: DC | PRN
Start: 2015-10-27 — End: 2015-10-29

## 2015-10-27 MED ORDER — FERROUS SULFATE 325 (65 FE) MG PO TABS
325.0000 mg | ORAL_TABLET | Freq: Two times a day (BID) | ORAL | Status: DC
  Administered 2015-10-29: 325 mg via ORAL
  Filled 2015-10-27 (×2): qty 1

## 2015-10-27 MED ORDER — SODIUM CHLORIDE 0.9% FLUSH: 3.0000 mL | Freq: Two times a day (BID) | INTRAVENOUS | Status: DC

## 2015-10-27 MED ORDER — ABACAVIR-DOLUTEGRAVIR-LAMIVUD 600-50-300 MG PO TABS
1.0000 | ORAL_TABLET | Freq: Every day | ORAL | Status: DC
  Administered 2015-10-28 – 2015-10-29 (×2): 1 via ORAL
  Filled 2015-10-27 (×2): qty 1

## 2015-10-27 MED ORDER — NITROGLYCERIN 0.4 MG SL SUBL: 0.4000 mg | SUBLINGUAL_TABLET | SUBLINGUAL | Status: DC | PRN

## 2015-10-27 MED ORDER — HEPARIN (PORCINE) IN NACL 100-0.45 UNIT/ML-% IJ SOLN
1000.0000 [IU]/h | INTRAMUSCULAR | Status: DC
  Administered 2015-10-27 – 2015-10-28 (×2): 1000 [IU]/h via INTRAVENOUS
  Filled 2015-10-27 (×2): qty 250

## 2015-10-27 MED ORDER — HEPARIN BOLUS VIA INFUSION
4000.0000 [IU] | Freq: Once | INTRAVENOUS | Status: AC
  Administered 2015-10-27: 4000 [IU] via INTRAVENOUS
  Filled 2015-10-27: qty 4000

## 2015-10-27 MED ORDER — ASPIRIN 81 MG PO CHEW
81.0000 mg | CHEWABLE_TABLET | Freq: Every day | ORAL | Status: DC
  Administered 2015-10-28 – 2015-10-29 (×2): 81 mg via ORAL
  Filled 2015-10-27 (×2): qty 1

## 2015-10-27 MED ORDER — ATORVASTATIN CALCIUM 10 MG PO TABS: 10.0000 mg | ORAL_TABLET | Freq: Every day | ORAL | Status: DC

## 2015-10-27 MED ORDER — ONDANSETRON HCL 4 MG/2ML IJ SOLN: 4.0000 mg | Freq: Four times a day (QID) | INTRAMUSCULAR | Status: DC | PRN

## 2015-10-27 MED ORDER — DOCUSATE SODIUM 100 MG PO CAPS
100.0000 mg | ORAL_CAPSULE | Freq: Two times a day (BID) | ORAL | Status: DC
  Administered 2015-10-27 – 2015-10-28 (×2): 100 mg via ORAL
  Filled 2015-10-27 (×2): qty 1

## 2015-10-27 NOTE — ED Provider Notes (Signed)
Care assumed from Dr. Rex Kras at shift change. Patient with an episode of chest pain that occurred prior to arrival. His initial troponin 0.15 with baseline renal insufficiency. In consultation with Dr. Einar Gip, the patient is having a second troponin performed. This has returned and is elevated, now it 0.27. Given the patient's history, his rising troponin, and concerning story, he will be admitted to the cardiology service for further evaluation.  Veryl Speak, MD 10/27/15 [phone]

## 2015-10-27 NOTE — ED Notes (Signed)
Per EMS: pt started having mid sternal CP while taking out trash; pt sts hx of stents in past and denies pain at present

## 2015-10-27 NOTE — Progress Notes (Signed)
ANTICOAGULATION CONSULT NOTE - Initial Consult  Pharmacy Consult for Heparin Indication: chest pain/ACS  Allergies  Allergen Reactions  . Tylenol [Acetaminophen] Other (See Comments)    sweats    Patient Measurements:   Heparin Dosing Weight: 77 kg (TBW)  Vital Signs: Temp: 97.8 F (36.6 C) (04/05 1442) Temp Source: Oral (04/05 1442) BP: 100/68 mmHg (04/05 1442) Pulse Rate: 67 (04/05 1442)  Labs:  Recent Labs  10/27/15 1632  HGB 13.8  HCT 42.1  PLT 127*    CrCl cannot be calculated (Unknown ideal weight.).   Medical History: Past Medical History  Diagnosis Date  . HIV disease ([place]) 06/25/2012  . Skin lesion 07/03/2012  . Leg pain 07/03/2012  . Sinus tachycardia ([place]) 08/06/2012  . Penile cyst 08/06/2012  . History of blood transfusion july 2015 per pt  . Cancer ([place])     hodgkins, no current treatment for  . Chronic renal insufficiency   . Cardiac arrest ([place]) 05/15/2014  . CKD (chronic kidney disease) s/p Right nephrectomy (04/30/2014) 05/19/2014  . Septic shock ([place]) 01/18/2014  . Enteritis 01/22/2014  . Staphylococcus aureus bacteremia 02/25/2014  . Shingles rash 02/25/2014  . CLABSI (central line-associated bloodstream infection) 02/27/2014  . NSTEMI (non-ST elevated myocardial infarction) ([place]) 05/19/2014  . Hydronephrosis, right & hydroureter: secondary to obstructing ureteral calculi 03/01/2014  . Transaminitis 05/19/2014  . Acute respiratory failure with hypoxemia: post cardiac arrest 05/19/2014  . Nephrolithiasis   . Obstructive uropathy 12/12/2014    Status post left renal stent    Assessment: 48-yo male presents with chest pain.  PMH includes HIV, prior CAD, CKD and Hodgkins disease.  Pharmacy consulted to start heparin in setting of chest pain/ACS.    Not on anticoagulation prior to admission, on ticagrelor.  SCr 2.3 (approximate baseline) with CrCl 47 mL/min.  CBC wnl, initial troponin mildly elevated  Goal of Therapy:  Heparin level 0.3-0.7  units/ml Monitor platelets by anticoagulation protocol: Yes   Plan:  Give 4000 units bolus x 1 Start heparin infusion at 1000 units/hr Check anti-Xa level in 6 hours and daily while on heparin Continue to monitor H&H and platelets   Viann Fish 10/27/2015,4:59 PM

## 2015-10-27 NOTE — ED Provider Notes (Signed)
CSN: [id number]     Arrival date & time 10/27/15  1436 History   First MD Initiated Contact with Patient 10/27/15 1441     Chief Complaint  Patient presents with  . Chest Pain     (Consider location/radiation/quality/duration/timing/severity/associated sxs/prior Treatment) HPI Comments: 35yo M w/ extensive PMH including HIV on HAART, CKD, CAD s/p stenting, previous Hodgkin's lymphoma who p/w chest pain. Earlier today, the patient was taking out his trash when he had a sudden onset of central, severe, sharp chest pain. He called EMS and when they arrived, he was complaining of severe pain. They gave him aspirin and loaded him onto the ambulance, after which his chest pain spontaneously resolved. He reports the entire episode lasted approximately 20 minutes. The chest pain was associated with nausea and feeling hot. He denies any associated shortness of breath. Currently, he feels well with no complaints. He denies any recent illness, cough/cold symptoms, fevers, or abdominal pain. No recent travel or history of blood clots.  Patient is a 35 y.o. male presenting with chest pain. The history is provided by the patient.  Chest Pain   Past Medical History  Diagnosis Date  . HIV disease ([place]) 06/25/2012  . Skin lesion 07/03/2012  . Leg pain 07/03/2012  . Sinus tachycardia ([place]) 08/06/2012  . Penile cyst 08/06/2012  . History of blood transfusion july 2015 per pt  . Cancer ([place])     hodgkins, no current treatment for  . Chronic renal insufficiency   . Cardiac arrest ([place]) 05/15/2014  . CKD (chronic kidney disease) s/p Right nephrectomy (04/30/2014) 05/19/2014  . Septic shock ([place]) 01/18/2014  . Enteritis 01/22/2014  . Staphylococcus aureus bacteremia 02/25/2014  . Shingles rash 02/25/2014  . CLABSI (central line-associated bloodstream infection) 02/27/2014  . NSTEMI (non-ST elevated myocardial infarction) ([place]) 05/19/2014  . Hydronephrosis, right & hydroureter: secondary to obstructing ureteral calculi  03/01/2014  . Transaminitis 05/19/2014  . Acute respiratory failure with hypoxemia: post cardiac arrest 05/19/2014  . Nephrolithiasis   . Obstructive uropathy 12/12/2014    Status post left renal stent   Past Surgical History  Procedure Laterality Date  . 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  this after several previous I and Ds of rectal abcess.   [name] node biopsy Right 08/08/2013    Procedure: SUPRACLAVICULAR LYMPH NODE BIOPSY;  Surgeon: Gaye Pollack, MD;  Location: [place] [facility];  Service: Thoracic;  Laterality: Right;  . 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 Tee without cardioversion N/A 02/27/2014    Procedure: TRANSESOPHAGEAL ECHOCARDIOGRAM (TEE);  Surgeon: Pixie Casino, MD;  Location: [facility];  Service: Cardiovascular;  Laterality: N/A;  . Pac placed and later removed    . Right nephrostomy tube  for last month and 1/2  . Laparoscopic nephrectomy Right 04/30/2014    Procedure: LAPAROSCOPIC NEPHRECTOMY AND URETERECTOMY;  Surgeon: Raynelle Bring, MD;  Location: [facility];  Service: Urology;  Laterality: Right;  . Cardiac catheterization  05/19/2014  . Coronary stent placement  05/19/2014    OMI   & PROXIMAL CIRCUMFLEX  . Left heart catheterization with coronary angiogram N/A 05/18/2014    Procedure: LEFT HEART CATHETERIZATION WITH CORONARY ANGIOGRAM;  Surgeon: Laverda Page, MD;  [facility] CATH LAB;  Service: Cardiovascular;  Laterality: N/A;  . Percutaneous coronary stent intervention (pci-s) N/A 05/19/2014    Procedure: PERCUTANEOUS CORONARY STENT INTERVENTION (PCI-S);  Surgeon: Laverda Page, MD;  Location: [facility] CATH  LAB;  Service: Cardiovascular;  Laterality: N/A;  . Cystoscopy with retrograde pyelogram, ureteroscopy and stent placement Left 12/11/2014    Procedure: CYSTOSCOPY WITH RETROGRADE PYELOGRAM, URETEROSCOPY, STONE REMOVAL, AND STENT PLACEMENT;  Surgeon: Kathie Rhodes, MD;  Location: [facility];  Service:  Urology;  Laterality: Left;  . Holmium laser application Left [id number]    Procedure: HOLMIUM LASER APPLICATION;  Surgeon: Kathie Rhodes, MD;  Location: [facility];  Service: Urology;  Laterality: Left;   History reviewed. No pertinent family history. Social History  Substance Use Topics  . Smoking status: Former Smoker -- 1.00 packs/day for 15 years    Types: Cigarettes    Start date: 08/24/2013    Quit date: 09/21/2013  . Smokeless tobacco: Never Used     Comment: quitline info, counseling  . Alcohol Use: No    Review of Systems  Cardiovascular: Positive for chest pain.   10 Systems reviewed and are negative for acute change except as noted in the HPI.    Allergies  Tylenol  Home Medications   Prior to Admission medications   Medication Sig Start Date End Date Taking? Authorizing Provider  Abacavir-Dolutegravir-Lamivud [id number] MG TABS Take 1 tablet by mouth daily.    Yes Historical Provider, MD  aspirin 81 MG tablet Take 81 mg by mouth daily.   Yes Historical Provider, MD  atorvastatin (LIPITOR) 10 MG tablet Take 10 mg by mouth daily.  11/26/14  Yes Historical Provider, MD  docusate sodium (COLACE) 100 MG capsule Take 1 capsule (100 mg total) by mouth 2 (two) times daily. 04/30/14  Yes Raynelle Bring, MD  feeding supplement, ENSURE COMPLETE, (ENSURE COMPLETE) LIQD Take 237 mLs by mouth 3 (three) times daily between meals. 01/28/15  Yes [name], MD  ferrous sulfate 325 (65 FE) MG tablet TAKE 1 TABLET (325 MG TOTAL) BY MOUTH TWO   (TWO) TIMES DAILY WITH A MEAL. 11/17/14  Yes [name], MD  metoprolol tartrate (LOPRESSOR) 25 MG tablet Take 1 tablet (25 mg total) by mouth 2 (two) times daily. 05/21/14  Yes Annita Brod, MD  ticagrelor (BRILINTA) 90 MG TABS tablet Take 1 tablet (90 mg total) by mouth 2 (two) times daily. 05/21/14  Yes Annita Brod, MD  TRIUMEQ 600-50-300 MG TABS TAKE 1 TABLET BY MOUTH DAILY. 04/16/15  Yes [name], MD  oxyCODONE (ROXICODONE) 5 MG  immediate release tablet Take 1 tablet (5 mg total) by mouth every 4 (four) hours as needed for severe pain. 04/30/14   Raynelle Bring, MD   BP 94/72 mmHg  Pulse 59  Temp(Src) 97.8 F (36.6 C) (Oral)  Resp 15  SpO2 100% Physical Exam  Constitutional: He is oriented to person, place, and time. He appears well-developed and well-nourished. No distress.  HENT:  Head: Normocephalic and atraumatic.  Moist mucous membranes  Eyes: Conjunctivae are normal. Pupils are equal, round, and reactive to light.  Neck: Neck supple.  Cardiovascular: Normal rate, regular rhythm and normal heart sounds.   No murmur heard. Pulmonary/Chest: Effort normal and breath sounds normal.  Abdominal: Soft. Bowel sounds are normal. He exhibits no distension. There is no tenderness.  Healed surgical scars  Musculoskeletal: He exhibits no edema.  Neurological: He is alert and oriented to person, place, and time.  Fluent speech  Skin: Skin is warm and dry.  Psychiatric: He has a normal mood and affect. Judgment normal.  Nursing note and vitals reviewed.   ED Course  Procedures (including critical care time) Labs Review  Labs Reviewed  BASIC METABOLIC PANEL - Abnormal; Notable for the following:    Creatinine, Ser 2.48 (*)    GFR calc non Af Amer 33 (*)    GFR calc Af Amer 38 (*)    All other components within normal limits  CBC WITH DIFFERENTIAL/PLATELET - Abnormal; Notable for the following:    RBC 4.02 (*)    MCV 104.7 (*)    MCH 34.3 (*)    Platelets 127 (*)    All other components within normal limits  I-STAT TROPOININ, ED - Abnormal; Notable for the following:    Troponin i, poc 0.15 (*)    All other components within normal limits  D-DIMER, QUANTITATIVE (NOT AT [facility])  HEPARIN LEVEL (UNFRACTIONATED)  HEPARIN LEVEL (UNFRACTIONATED)  CBC    Imaging Review Dg Chest 2 View  10/27/2015  CLINICAL DATA:  Sudden onset of mid chest pain today. EXAM: CHEST  2 VIEW COMPARISON:  12/10/2014 FINDINGS: The  heart size and mediastinal contours are within normal limits. Both lungs are clear. The visualized skeletal structures are unremarkable. IMPRESSION: No active cardiopulmonary disease. Electronically Signed   By: Rolm Baptise M.D.   On: 10/27/2015 15:10   I have personally reviewed and evaluated these lab results as part of my medical decision-making.   EKG Interpretation   Date/Time:  Wednesday October 27 2015 14:35:15 EDT Ventricular Rate:  59 PR Interval:  160 QRS Duration: 84 QT Interval:  383 QTC Calculation: 379 R Axis:   15 Text Interpretation:  Sinus rhythm ST elev, probable normal early repol  pattern No significant change since last tracing Confirmed by LITTLE MD,  RACHEL [phone]) on 10/27/2015 5:19:30 PM      Medications  heparin ADULT infusion 100 units/mL (25000 units/250 mL) (1,000 Units/hr Intravenous New Bag/Given 10/27/15 1730)  heparin bolus via infusion 4,000 Units (4,000 Units Intravenous Given 10/27/15 1730)    MDM   Final diagnoses:  Chest pain, unspecified chest pain type   PT w/ sudden onset of chest pain lasting approx 20 minutes with spontaneous resolution after  Aspirin only. On arrival by EMS, the patient was awake and alert, well-appearing with no complaints. Vital signs unremarkable. EKG shows sinus rhythm with no ischemic changes. Obtained above lab work as well as chest x-ray.  Chest x-ray unremarkable. Labs show initial troponin of 0.15. Given the patient's history of an STEMI and stenting, initiated a heparin drip for ACS protocol. D-dimer negative for PE.  I discussed at length with Dr. Einar Gip, who recommended getting delta troponin. He will recommend admission if troponin is same or elevated; he recommends discharge if troponin normal. I have relayed this information to the oncoming provider, and pt's dispo will be determined by repeat labwork.   Sharlett Iles, MD 10/27/15 [phone]

## 2015-10-28 ENCOUNTER — Encounter (HOSPITAL_COMMUNITY)
Admission: EM | Disposition: A | Discharge status: Home / Self Care | Payer: Self-pay | Source: Home / Self Care | Attending: Cardiology

## 2015-10-28 HISTORY — PX: CARDIAC CATHETERIZATION: SHX172

## 2015-10-28 LAB — TROPONIN I
TROPONIN I: 0.03 ng/mL (ref <0.031)
Troponin I: 0.1 ng/mL — ABNORMAL HIGH (ref <0.031)

## 2015-10-28 LAB — BASIC METABOLIC PANEL
ANION GAP: 7 (ref 5–15)
BUN: 13 mg/dL (ref 6–20)
CALCIUM: 8.5 mg/dL — AB (ref 8.9–10.3)
CHLORIDE: 110 mmol/L (ref 101–111)
CO2: 23 mmol/L (ref 22–32)
Creatinine, Ser: 2.08 mg/dL — ABNORMAL HIGH (ref 0.61–1.24)
GFR calc non Af Amer: 40 mL/min — ABNORMAL LOW (ref >60)
GFR, EST AFRICAN AMERICAN: 47 mL/min — AB (ref >60)
Glucose, Bld: 88 mg/dL (ref 65–99)
Potassium: 4.2 mmol/L (ref 3.5–5.1)
SODIUM: 140 mmol/L (ref 135–145)

## 2015-10-28 LAB — LIPID PANEL
CHOL/HDL RATIO: 3.3 ratio
Cholesterol: 119 mg/dL (ref 0–200)
HDL: 36 mg/dL — ABNORMAL LOW (ref >40)
LDL Cholesterol: 71 mg/dL (ref 0–99)
Triglycerides: 62 mg/dL (ref <150)
VLDL: 12 mg/dL (ref 0–40)

## 2015-10-28 LAB — HEPARIN LEVEL (UNFRACTIONATED)
Heparin Unfractionated: 0.35 IU/mL (ref 0.30–0.70)
Heparin Unfractionated: 0.33 IU/mL (ref 0.30–0.70)

## 2015-10-28 LAB — CBC
HCT: 40.2 % (ref 39.0–52.0)
Hemoglobin: 13.7 g/dL (ref 13.0–17.0)
MCH: 35.7 pg — AB (ref 26.0–34.0)
MCHC: 34.1 g/dL (ref 30.0–36.0)
MCV: 104.7 fL — AB (ref 78.0–100.0)
PLATELETS: 134 10*3/uL — AB (ref 150–400)
RBC: 3.84 MIL/uL — AB (ref 4.22–5.81)
RDW: 12.8 % (ref 11.5–15.5)
WBC: 4.2 10*3/uL (ref 4.0–10.5)

## 2015-10-28 LAB — TSH: TSH: 1.993 u[IU]/mL (ref 0.350–4.500)

## 2015-10-28 LAB — PROTIME-INR
INR: 1.07 (ref 0.00–1.49)
PROTHROMBIN TIME: 14.1 s (ref 11.6–15.2)

## 2015-10-28 SURGERY — LEFT HEART CATH AND CORONARY ANGIOGRAPHY

## 2015-10-28 MED ORDER — LIDOCAINE HCL (PF) 1 % IJ SOLN
INTRAMUSCULAR | Status: DC | PRN
  Administered 2015-10-28: 3 mL

## 2015-10-28 MED ORDER — IOPAMIDOL (ISOVUE-370) INJECTION 76%
INTRAVENOUS | Status: AC
  Filled 2015-10-28: qty 100

## 2015-10-28 MED ORDER — SODIUM CHLORIDE 0.9 % IV SOLN: 250.0000 mL | INTRAVENOUS | Status: DC | PRN

## 2015-10-28 MED ORDER — FENTANYL CITRATE (PF) 100 MCG/2ML IJ SOLN
INTRAMUSCULAR | Status: DC | PRN
  Administered 2015-10-28: 25 ug via INTRAVENOUS

## 2015-10-28 MED ORDER — SODIUM CHLORIDE 0.9% FLUSH: 3.0000 mL | INTRAVENOUS | Status: DC | PRN

## 2015-10-28 MED ORDER — HEPARIN SODIUM (PORCINE) 1000 UNIT/ML IJ SOLN
INTRAMUSCULAR | Status: AC
  Filled 2015-10-28: qty 1

## 2015-10-28 MED ORDER — VERAPAMIL HCL 2.5 MG/ML IV SOLN
INTRAVENOUS | Status: AC
  Filled 2015-10-28: qty 2

## 2015-10-28 MED ORDER — TICAGRELOR 90 MG PO TABS
90.0000 mg | ORAL_TABLET | Freq: Once | ORAL | Status: AC
  Administered 2015-10-28: 90 mg via ORAL
  Filled 2015-10-28: qty 1

## 2015-10-28 MED ORDER — FENTANYL CITRATE (PF) 100 MCG/2ML IJ SOLN
INTRAMUSCULAR | Status: AC
  Filled 2015-10-28: qty 2

## 2015-10-28 MED ORDER — MIDAZOLAM HCL 2 MG/2ML IJ SOLN
INTRAMUSCULAR | Status: DC | PRN
  Administered 2015-10-28: 2 mg via INTRAVENOUS

## 2015-10-28 MED ORDER — NITROGLYCERIN 1 MG/10 ML FOR IR/CATH LAB
INTRA_ARTERIAL | Status: DC | PRN
  Administered 2015-10-28: 200 ug via INTRACORONARY

## 2015-10-28 MED ORDER — SODIUM CHLORIDE 0.9 % WEIGHT BASED INFUSION: 3.0000 mL/kg/h | INTRAVENOUS | Status: AC

## 2015-10-28 MED ORDER — SODIUM CHLORIDE 0.9 % WEIGHT BASED INFUSION
3.0000 mL/kg/h | INTRAVENOUS | Status: AC
  Administered 2015-10-28: 3 mL/kg/h via INTRAVENOUS

## 2015-10-28 MED ORDER — TICAGRELOR 90 MG PO TABS: 90.0000 mg | ORAL_TABLET | Freq: Once | ORAL | Status: DC

## 2015-10-28 MED ORDER — VERAPAMIL HCL 2.5 MG/ML IV SOLN
INTRA_ARTERIAL | Status: DC | PRN
  Administered 2015-10-28: 15 mL via INTRA_ARTERIAL

## 2015-10-28 MED ORDER — LIDOCAINE HCL (PF) 1 % IJ SOLN
INTRAMUSCULAR | Status: AC
  Filled 2015-10-28: qty 30

## 2015-10-28 MED ORDER — SODIUM CHLORIDE 0.9% FLUSH: 3.0000 mL | Freq: Two times a day (BID) | INTRAVENOUS | Status: DC

## 2015-10-28 MED ORDER — SODIUM CHLORIDE 0.9 % WEIGHT BASED INFUSION
1.0000 mL/kg/h | INTRAVENOUS | Status: DC
  Administered 2015-10-28: 500 mL via INTRAVENOUS
  Administered 2015-10-28 (×2): 1 mL/kg/h via INTRAVENOUS

## 2015-10-28 MED ORDER — HEPARIN SODIUM (PORCINE) 1000 UNIT/ML IJ SOLN
INTRAMUSCULAR | Status: DC | PRN
  Administered 2015-10-28: 5000 [IU] via INTRAVENOUS

## 2015-10-28 MED ORDER — HEPARIN (PORCINE) IN NACL 2-0.9 UNIT/ML-% IJ SOLN
INTRAMUSCULAR | Status: AC
  Filled 2015-10-28: qty 1000

## 2015-10-28 MED ORDER — MIDAZOLAM HCL 2 MG/2ML IJ SOLN
INTRAMUSCULAR | Status: AC
  Filled 2015-10-28: qty 2

## 2015-10-28 MED ORDER — HEPARIN (PORCINE) IN NACL 2-0.9 UNIT/ML-% IJ SOLN: INTRAMUSCULAR | Status: DC | PRN

## 2015-10-28 MED ORDER — HEPARIN (PORCINE) IN NACL 2-0.9 UNIT/ML-% IJ SOLN
INTRAMUSCULAR | Status: DC | PRN
  Administered 2015-10-28: 1000 mL

## 2015-10-28 MED ORDER — NITROGLYCERIN 1 MG/10 ML FOR IR/CATH LAB
INTRA_ARTERIAL | Status: AC
  Filled 2015-10-28: qty 10

## 2015-10-28 SURGICAL SUPPLY — 9 items
CATH OPTITORQUE TIG 4.5 5F (CATHETERS) ×2 IMPLANT
DEVICE RAD COMP TR BAND LRG (VASCULAR PRODUCTS) ×2 IMPLANT
GLIDESHEATH SLEND A-KIT 6F 20G (SHEATH) ×2 IMPLANT
KIT HEART LEFT (KITS) ×2 IMPLANT
PACK CARDIAC CATHETERIZATION (CUSTOM PROCEDURE TRAY) ×2 IMPLANT
TRANSDUCER W/STOPCOCK (MISCELLANEOUS) ×2 IMPLANT
TUBING CIL FLEX 10 FLL-RA (TUBING) ×2 IMPLANT
VALVE MANIFOLD 3 PORT W/RA/ON (MISCELLANEOUS) ×2 IMPLANT
WIRE SAFE-T 1.5MM-J .035X260CM (WIRE) ×2 IMPLANT

## 2015-10-28 NOTE — Progress Notes (Addendum)
Patient returned from Cath lab @ 1800. A&O x 4, got up off of the stretcher and walked to his bed with steady gate, denies pain, TR band intact and patient states he is starved - "I need some food."

## 2015-10-28 NOTE — Progress Notes (Signed)
ANTICOAGULATION CONSULT NOTE - Follow-up Consult  Pharmacy Consult for Heparin Indication: chest pain/ACS  Allergies  Allergen Reactions  . Tylenol [Acetaminophen] Other (See Comments)    sweats    Patient Measurements: Height: 6\' 1"  (185.4 cm) Weight: 165 lb 2 oz (74.9 kg) IBW/kg (Calculated) : 79.9 Heparin Dosing Weight: 77 kg (TBW)  Vital Signs: Temp: 97.9 F (36.6 C) (04/05 2054) Temp Source: Oral (04/05 2054) BP: 103/67 mmHg (04/05 2054) Pulse Rate: 57 (04/05 2054)  Labs:  Recent Labs  10/27/15 1632 10/27/15 2301 10/27/15 2338  HGB 13.8  --   --   HCT 42.1  --   --   PLT 127*  --   --   HEPARINUNFRC  --   --  0.35  CREATININE 2.48*  --   --   TROPONINI  --  0.22*  --     Estimated Creatinine Clearance: 45.3 mL/min (by C-G formula based on Cr of 2.48).   Assessment: 34-yo male on heparin for chest pain/ACS. Heparin level therapeutic on 1000 units/hr. No bleeding noted.  Goal of Therapy:  Heparin level 0.3-0.7 units/ml Monitor platelets by anticoagulation protocol: Yes   Plan:  Continue heparin infusion at 1000 units/hr F/u daily heparin level and CBC   Sherlon Handing, PharmD, BCPS Clinical pharmacist, pager [phone] 10/28/2015,1:14 AM

## 2015-10-28 NOTE — Progress Notes (Signed)
Utilization review completed.  

## 2015-10-28 NOTE — H&P (Signed)
Thomas [name] is an 35 y.o. male.   Chief Complaint: Chest pain HPI: Thomas [name]  is a 35 y.o. male with a history of HIV, CKD, Hodgkin's lymphoma, and CAD s/p PTCA and stenting in 2015 who presented on 10/27/2015 with substernal chest pain that began while taking out his trash. Symptoms lasted for approximately 20 minutes and resolved after EMS administered ASA.  He reports associated nausea and flushing without SOB.  Troponin minimally elevated. No acute EKG changes.  Past Medical History  Diagnosis Date  . HIV disease ([place]) 06/25/2012  . Skin lesion 07/03/2012  . Leg pain 07/03/2012  . Sinus tachycardia ([place]) 08/06/2012  . Penile cyst 08/06/2012  . History of blood transfusion july 2015 per pt  . Cancer ([place])     hodgkins, no current treatment for  . Chronic renal insufficiency   . Cardiac arrest ([place]) 05/15/2014  . CKD (chronic kidney disease) s/p Right nephrectomy (04/30/2014) 05/19/2014  . Septic shock ([place]) 01/18/2014  . Enteritis 01/22/2014  . Staphylococcus aureus bacteremia 02/25/2014  . Shingles rash 02/25/2014  . CLABSI (central line-associated bloodstream infection) 02/27/2014  . NSTEMI (non-ST elevated myocardial infarction) ([place]) 05/19/2014  . Hydronephrosis, right & hydroureter: secondary to obstructing ureteral calculi 03/01/2014  . Transaminitis 05/19/2014  . Acute respiratory failure with hypoxemia: post cardiac arrest 05/19/2014  . Nephrolithiasis   . Obstructive uropathy 12/12/2014    Status post left renal stent    Past Surgical History  Procedure Laterality Date  . 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  this after several previous I and Ds of rectal abcess.   [name] node biopsy Right 08/08/2013    Procedure: SUPRACLAVICULAR LYMPH NODE BIOPSY;  Surgeon: Gaye Pollack, MD;  Location: [facility];  Service: Thoracic;  Laterality: Right;  . 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 Tee without cardioversion N/A  02/27/2014    Procedure: TRANSESOPHAGEAL ECHOCARDIOGRAM (TEE);  Surgeon: Pixie Casino, MD;  Location: [facility];  Service: Cardiovascular;  Laterality: N/A;  . Pac placed and later removed    . Right nephrostomy tube  for last month and 1/2  . Laparoscopic nephrectomy Right 04/30/2014    Procedure: LAPAROSCOPIC NEPHRECTOMY AND URETERECTOMY;  Surgeon: Raynelle Bring, MD;  Location: [facility];  Service: Urology;  Laterality: Right;  . Cardiac catheterization  05/19/2014  . Coronary stent placement  05/19/2014    OMI   & PROXIMAL CIRCUMFLEX  . Left heart catheterization with coronary angiogram N/A 05/18/2014    Procedure: LEFT HEART CATHETERIZATION WITH CORONARY ANGIOGRAM;  Surgeon: Laverda Page, MD;  Location: [facility] CATH LAB;  Service: Cardiovascular;  Laterality: N/A;  . Percutaneous coronary stent intervention (pci-s) N/A 05/19/2014    Procedure: PERCUTANEOUS CORONARY STENT INTERVENTION (PCI-S);  Surgeon: Laverda Page, MD;  Location: [facility] CATH LAB;  Service: Cardiovascular;  Laterality: N/A;  . Cystoscopy with retrograde pyelogram, ureteroscopy and stent placement Left 12/11/2014    Procedure: CYSTOSCOPY WITH RETROGRADE PYELOGRAM, URETEROSCOPY, STONE REMOVAL, AND STENT PLACEMENT;  Surgeon: Kathie Rhodes, MD;  Location: [facility];  Service: Urology;  Laterality: Left;  . Holmium laser application Left 9/62/9528    Procedure: HOLMIUM LASER APPLICATION;  Surgeon: Kathie Rhodes, MD;  Location: [facility];  Service: Urology;  Laterality: Left;    History reviewed. No pertinent family history. Social History:  reports that he quit smoking about 2 years ago. His smoking use included Cigarettes. He started smoking about 2  years ago. He has a 15 pack-year smoking history. He has never used smokeless tobacco. He reports that he does not drink alcohol or use illicit drugs.  Allergies:  Allergies  Allergen Reactions  . Tylenol [Acetaminophen] Other (See Comments)    sweats    Review of Systems - History  obtained from chart review and the patient General ROS: negative for - fatigue, fever or night sweats Hematological and Lymphatic ROS: negative for - bleeding problems or blood clots Respiratory ROS: no cough, shortness of breath, or wheezing Cardiovascular ROS: positive for - chest pain negative for - edema, orthopnea, palpitations or paroxysmal nocturnal dyspnea Gastrointestinal ROS: positive for - nausea with chest pain negative for - abdominal pain or blood in stools Neurological ROS: no TIA or stroke symptoms  Blood pressure 100/67, pulse 59, temperature 98.2 F (36.8 C), temperature source Oral, resp. rate 20, height 6' 1"  (1.854 m), weight 74.9 kg (165 lb 2 oz), SpO2 100 %.   General appearance: alert, cooperative, appears stated age and no distress Eyes: negative Neck: no adenopathy, no carotid bruit, no JVD, supple, symmetrical, trachea midline and thyroid not enlarged, symmetric, no tenderness/mass/nodules Resp: clear to auscultation bilaterally Chest wall: no tenderness Cardio: regular rate and rhythm, S1, S2 normal, no murmur, click, rub or gallop GI: soft, non-tender; bowel sounds normal; no masses,  no organomegaly Extremities: extremities normal, atraumatic, no cyanosis or edema Pulses: 2+ and symmetric Skin: Skin color, texture, turgor normal. No rashes or lesions Neurologic: Grossly normal  Results for orders placed or performed during the hospital encounter of 10/27/15 (from the past 48 hour(s))  I-stat troponin, ED     Status: Abnormal   Collection Time: 10/27/15  4:31 PM  Result Value Ref Range   Troponin i, poc 0.15 (HH) 0.00 - 0.08 ng/mL   Comment NOTIFIED PHYSICIAN    Comment 3            Comment: Due to the release kinetics of cTnI, a negative result within the first hours of the onset of symptoms does not rule out myocardial infarction with certainty. If myocardial infarction is still suspected, repeat the test at appropriate intervals.   Basic  metabolic panel     Status: Abnormal   Collection Time: 10/27/15  4:32 PM  Result Value Ref Range   Sodium 142 135 - 145 mmol/L   Potassium 4.3 3.5 - 5.1 mmol/L   Chloride 108 101 - 111 mmol/L   CO2 26 22 - 32 mmol/L   Glucose, Bld 92 65 - 99 mg/dL   BUN 17 6 - 20 mg/dL   Creatinine, Ser 2.48 (H) 0.61 - 1.24 mg/dL   Calcium 8.9 8.9 - 10.3 mg/dL   GFR calc non Af Amer 33 (L) >60 mL/min   GFR calc Af Amer 38 (L) >60 mL/min    Comment: (NOTE) The eGFR has been calculated using the CKD EPI equation. This calculation has not been validated in all clinical situations. eGFR's persistently <60 mL/min signify possible Chronic Kidney Disease.    Anion gap 8 5 - 15  CBC with Differential     Status: Abnormal   Collection Time: 10/27/15  4:32 PM  Result Value Ref Range   WBC 4.8 4.0 - 10.5 K/uL   RBC 4.02 (L) 4.22 - 5.81 MIL/uL   Hemoglobin 13.8 13.0 - 17.0 g/dL   HCT 42.1 39.0 - 52.0 %   MCV 104.7 (H) 78.0 - 100.0 fL   MCH 34.3 (H) 26.0 - 34.0  pg   MCHC 32.8 30.0 - 36.0 g/dL   RDW 12.4 11.5 - 15.5 %   Platelets 127 (L) 150 - 400 K/uL   Neutrophils Relative % 63 %   Neutro Abs 3.1 1.7 - 7.7 K/uL   Lymphocytes Relative 29 %   Lymphs Abs 1.4 0.7 - 4.0 K/uL   Monocytes Relative 7 %   Monocytes Absolute 0.3 0.1 - 1.0 K/uL   Eosinophils Relative 1 %   Eosinophils Absolute 0.0 0.0 - 0.7 K/uL   Basophils Relative 0 %   Basophils Absolute 0.0 0.0 - 0.1 K/uL  D-dimer, quantitative     Status: None   Collection Time: 10/27/15  5:48 PM  Result Value Ref Range   D-Dimer, Quant <0.27 0.00 - 0.50 ug/mL-FEU    Comment: (NOTE) At the manufacturer cut-off of 0.50 ug/mL FEU, this assay has been documented to exclude PE with a sensitivity and negative predictive value of 97 to 99%.  At this time, this assay has not been approved by the FDA to exclude DVT/VTE. Results should be correlated with clinical presentation.   I-stat troponin, ED     Status: Abnormal   Collection Time: 10/27/15  7:10 PM   Result Value Ref Range   Troponin i, poc 0.26 (HH) 0.00 - 0.08 ng/mL   Comment NOTIFIED PHYSICIAN    Comment 3            Comment: Due to the release kinetics of cTnI, a negative result within the first hours of the onset of symptoms does not rule out myocardial infarction with certainty. If myocardial infarction is still suspected, repeat the test at appropriate intervals.   TSH     Status: None   Collection Time: 10/27/15 11:01 PM  Result Value Ref Range   TSH 1.993 0.350 - 4.500 uIU/mL  Troponin I     Status: Abnormal   Collection Time: 10/27/15 11:01 PM  Result Value Ref Range   Troponin I 0.22 (H) <0.031 ng/mL    Comment:        PERSISTENTLY INCREASED TROPONIN VALUES IN THE RANGE OF 0.04-0.49 ng/mL CAN BE SEEN IN:       -UNSTABLE ANGINA       -CONGESTIVE HEART FAILURE       -MYOCARDITIS       -CHEST TRAUMA       -ARRYHTHMIAS       -LATE PRESENTING MYOCARDIAL INFARCTION       -COPD   CLINICAL FOLLOW-UP RECOMMENDED.   Heparin level (unfractionated)     Status: None   Collection Time: 10/27/15 11:38 PM  Result Value Ref Range   Heparin Unfractionated 0.35 0.30 - 0.70 IU/mL    Comment:        IF HEPARIN RESULTS ARE BELOW EXPECTED VALUES, AND PATIENT DOSAGE HAS BEEN CONFIRMED, SUGGEST FOLLOW UP TESTING OF ANTITHROMBIN III LEVELS.   Heparin level (unfractionated)     Status: None   Collection Time: 10/28/15  5:07 AM  Result Value Ref Range   Heparin Unfractionated 0.33 0.30 - 0.70 IU/mL    Comment:        IF HEPARIN RESULTS ARE BELOW EXPECTED VALUES, AND PATIENT DOSAGE HAS BEEN CONFIRMED, SUGGEST FOLLOW UP TESTING OF ANTITHROMBIN III LEVELS.   Troponin I     Status: Abnormal   Collection Time: 10/28/15  5:07 AM  Result Value Ref Range   Troponin I 0.10 (H) <0.031 ng/mL    Comment:  PERSISTENTLY INCREASED TROPONIN VALUES IN THE RANGE OF 0.04-0.49 ng/mL CAN BE SEEN IN:       -UNSTABLE ANGINA       -CONGESTIVE HEART FAILURE       -MYOCARDITIS        -CHEST TRAUMA       -ARRYHTHMIAS       -LATE PRESENTING MYOCARDIAL INFARCTION       -COPD   CLINICAL FOLLOW-UP RECOMMENDED.   Lipid panel     Status: Abnormal   Collection Time: 10/28/15  5:09 AM  Result Value Ref Range   Cholesterol 119 0 - 200 mg/dL   Triglycerides 62 <150 mg/dL   HDL 36 (L) >40 mg/dL   Total CHOL/HDL Ratio 3.3 RATIO   VLDL 12 0 - 40 mg/dL   LDL Cholesterol 71 0 - 99 mg/dL    Comment:        Total Cholesterol/HDL:CHD Risk Coronary Heart Disease Risk Table                     Men   Women  1/2 Average Risk   3.4   3.3  Average Risk       5.0   4.4  2 X Average Risk   9.6   7.1  3 X Average Risk  23.4   11.0        Use the calculated Patient Ratio above and the CHD Risk Table to determine the patient's CHD Risk.        ATP III CLASSIFICATION (LDL):  <100     mg/dL   Optimal  100-129  mg/dL   Near or Above                    Optimal  130-159  mg/dL   Borderline  160-189  mg/dL   High  >190     mg/dL   Very High   CBC     Status: Abnormal   Collection Time: 10/28/15  5:11 AM  Result Value Ref Range   WBC 4.2 4.0 - 10.5 K/uL   RBC 3.84 (L) 4.22 - 5.81 MIL/uL   Hemoglobin 13.7 13.0 - 17.0 g/dL   HCT 40.2 39.0 - 52.0 %   MCV 104.7 (H) 78.0 - 100.0 fL   MCH 35.7 (H) 26.0 - 34.0 pg   MCHC 34.1 30.0 - 36.0 g/dL   RDW 12.8 11.5 - 15.5 %   Platelets 134 (L) 150 - 400 K/uL   Dg Chest 2 View  10/27/2015  CLINICAL DATA:  Sudden onset of mid chest pain today. EXAM: CHEST  2 VIEW COMPARISON:  12/10/2014 FINDINGS: The heart size and mediastinal contours are within normal limits. Both lungs are clear. The visualized skeletal structures are unremarkable. IMPRESSION: No active cardiopulmonary disease. Electronically Signed   By: Rolm Baptise M.D.   On: 10/27/2015 15:10    Labs:   Lab Results  Component Value Date   WBC 4.2 10/28/2015   HGB 13.7 10/28/2015   HCT 40.2 10/28/2015   MCV 104.7* 10/28/2015   PLT 134* 10/28/2015    Recent Labs Lab 10/27/15 1632   NA 142  K 4.3  CL 108  CO2 26  BUN 17  CREATININE 2.48*  CALCIUM 8.9  GLUCOSE 92    Lipid Panel     Component Value Date/Time   CHOL 119 10/28/2015 0509   TRIG 62 10/28/2015 0509   HDL 36* 10/28/2015 0509   CHOLHDL 3.3 10/28/2015  0509   VLDL 12 10/28/2015 0509   LDLCALC 71 10/28/2015 0509    BNP (last 3 results) No results for input(s): BNP in the last 8760 hours.  HEMOGLOBIN A1C Lab Results  Component Value Date   HGBA1C 5.3 12/10/2014   MPG 105 12/10/2014    Cardiac Panel (last 3 results)  Recent Labs  12/10/14 1724 10/27/15 2301 10/28/15 0507  TROPONINI <0.03 0.22* 0.10*    Lab Results  Component Value Date   CKTOTAL 3146* 05/19/2014   TROPONINI 0.10* 10/28/2015     TSH  Recent Labs  12/10/14 2245 10/27/15 2301  TSH 1.437 1.993    EKG 10/28/2015: sinus bradycardia at a rate of 55bpm, normal axis, left atrial abnormality, no evidence of ischemia.  Medications Prior to Admission  Medication Sig Dispense Refill  . Abacavir-Dolutegravir-Lamivud 600-50-300 MG TABS Take 1 tablet by mouth daily.     Marland Kitchen aspirin 81 MG tablet Take 81 mg by mouth daily.    Marland Kitchen atorvastatin (LIPITOR) 10 MG tablet Take 10 mg by mouth daily.     Marland Kitchen docusate sodium (COLACE) 100 MG capsule Take 1 capsule (100 mg total) by mouth 2 (two) times daily. 30 capsule 0  . feeding supplement, ENSURE COMPLETE, (ENSURE COMPLETE) LIQD Take 237 mLs by mouth 3 (three) times daily between meals. 237 mL 6  . ferrous sulfate 325 (65 FE) MG tablet TAKE 1 TABLET (325 MG TOTAL) BY MOUTH TWO   (TWO) TIMES DAILY WITH A MEAL. 60 tablet 0  . metoprolol tartrate (LOPRESSOR) 25 MG tablet Take 1 tablet (25 mg total) by mouth 2 (two) times daily. 60 tablet 1  . ticagrelor (BRILINTA) 90 MG TABS tablet Take 1 tablet (90 mg total) by mouth 2 (two) times daily. 60 tablet 6  . TRIUMEQ 600-50-300 MG TABS TAKE 1 TABLET BY MOUTH DAILY. 30 tablet 6  . oxyCODONE (ROXICODONE) 5 MG immediate release tablet Take 1 tablet  (5 mg total) by mouth every 4 (four) hours as needed for severe pain. 30 tablet 0      Current facility-administered medications:  .  0.9 %  sodium chloride infusion, 250 mL, Intravenous, PRN, Adrian Prows, MD .  0.9 %  sodium chloride infusion, 250 mL, Intravenous, PRN, Adrian Prows, MD .  [EXPIRED] 0.9% sodium chloride infusion, 3 mL/kg/hr, Intravenous, Continuous, Last Rate: 224.7 mL/hr at 10/28/15 0730, 3 mL/kg/hr at 10/28/15 0730 **FOLLOWED BY** 0.9% sodium chloride infusion, 1 mL/kg/hr, Intravenous, Continuous, Adrian Prows, MD .  abacavir-dolutegravir-lamiVUDine (TRIUMEQ) 600-50-300 MG per tablet 1 tablet, 1 tablet, Oral, Daily, Adrian Prows, MD .  aspirin chewable tablet 81 mg, 81 mg, Oral, Daily, Adrian Prows, MD, 81 mg at 10/28/15 1000 .  atorvastatin (LIPITOR) tablet 10 mg, 10 mg, Oral, q1800, Adrian Prows, MD .  docusate sodium (COLACE) capsule 100 mg, 100 mg, Oral, BID, Adrian Prows, MD, 100 mg at 10/27/15 2234 .  feeding supplement (ENSURE ENLIVE) (ENSURE ENLIVE) liquid 237 mL, 237 mL, Oral, TID BM, Adrian Prows, MD .  ferrous sulfate tablet 325 mg, 325 mg, Oral, BID WC, Adrian Prows, MD .  heparin ADULT infusion 100 units/mL (25000 units/250 mL), 1,000 Units/hr, Intravenous, Continuous, Sharlett Iles, MD, Last Rate: 10 mL/hr at 10/27/15 1730, 1,000 Units/hr at 10/27/15 1730 .  metoprolol tartrate (LOPRESSOR) tablet 25 mg, 25 mg, Oral, BID, Adrian Prows, MD, 25 mg at 10/27/15 2234 .  nitroGLYCERIN (NITROSTAT) SL tablet 0.4 mg, 0.4 mg, Sublingual, Q5 Min x 3 PRN, Adrian Prows, MD .  ondansetron (ZOFRAN) injection 4 mg, 4 mg, Intravenous, Q6H PRN, Adrian Prows, MD .  sodium chloride flush (NS) 0.9 % injection 3 mL, 3 mL, Intravenous, Q12H, Adrian Prows, MD, 3 mL at 10/27/15 2234 .  sodium chloride flush (NS) 0.9 % injection 3 mL, 3 mL, Intravenous, PRN, Adrian Prows, MD .  sodium chloride flush (NS) 0.9 % injection 3 mL, 3 mL, Intravenous, Q12H, Adrian Prows, MD .  sodium chloride flush (NS) 0.9 % injection 3 mL, 3 mL,  Intravenous, PRN, Adrian Prows, MD  Facility-Administered Medications Ordered in Other Encounters:  .  sodium chloride 0.9 % injection 10 mL, 10 mL, Intravenous, PRN, Wyatt Portela, MD, 10 mL at 10/31/13 1601    Assessment/Plan 1. Exertional chest pain 2. NSTEMI 3. CAD (05/19/2014: PTCA and Stentingof the OM1 and proximal Circumflex with a 3.0 x 16 mm Promus Premier DES, scoring balloon angioplasty of the mid and distal circumflex quality artery with a 2.5 x 10 mm Angiosculpt, balloon angioplasty of the mid segment of the circumflex coronary artery with a 2.0 x 15 mm Euphora) 4. CKD Stage IV 5. Mild hyperlipidemia with low HDL 6. HIV 7. History of Hodgin's Lymphoma  Recommendation: Pt presents with one episode of exertional chest pain, positive troponin. History of CAD. Given risk factors and symptoms, and abnormal labs, recommend coronary angiogram for reevaluation of coronary anatomy. This will be scheduled for later today. Continue heparin gtt for now. Pt currently pain free.  Rachel Bo, NP-C 10/28/2015, 7:58 AM [facility]. [place] Pager: [phone] Office: [phone]

## 2015-10-28 NOTE — Interval H&P Note (Signed)
History and Physical Interval Note:  10/28/2015 5:09 PM  Thomas [name]  has presented today for surgery, with the diagnosis of cp  The various methods of treatment have been discussed with the patient and family. After consideration of risks, benefits and other options for treatment, the patient has consented to  Procedure(s): Left Heart Cath and Coronary Angiography (N/A) as a surgical intervention .  The patient's history has been reviewed, patient examined, no change in status, stable for surgery.  I have reviewed the patient's chart and labs.  Questions were answered to the patient's satisfaction.    I am aware of the chronic renal insufficiency and he has received IV hydration and will avoid excess contrast use   [name]

## 2015-10-28 NOTE — Progress Notes (Signed)
ANTICOAGULATION CONSULT NOTE - Follow-up Consult  Pharmacy Consult for Heparin Indication: chest pain/ACS  Allergies  Allergen Reactions  . Tylenol [Acetaminophen] Other (See Comments)    sweats    Patient Measurements: Height: 6\' 1"  (185.4 cm) Weight: 165 lb 2 oz (74.9 kg) IBW/kg (Calculated) : 79.9 Heparin Dosing Weight: 77 kg (TBW)  Vital Signs: Temp: 98.3 F (36.8 C) (04/06 0939) Temp Source: Oral (04/06 0939) BP: 105/55 mmHg (04/06 0939) Pulse Rate: 68 (04/06 0939)  Labs:  Recent Labs  10/27/15 1632 10/27/15 2301 10/27/15 2338 10/28/15 0507 10/28/15 0511  HGB 13.8  --   --   --  13.7  HCT 42.1  --   --   --  40.2  PLT 127*  --   --   --  134*  HEPARINUNFRC  --   --  0.35 0.33  --   CREATININE 2.48*  --   --   --   --   TROPONINI  --  0.22*  --  0.10*  --     Estimated Creatinine Clearance: 45.3 mL/min (by C-G formula based on Cr of 2.48).   Assessment: 46-yo male on heparin for chest pain/ACS (history of CAD with stent in 2015). Heparin level is at goal on 1000 units/hr. Plans noted for cath today.   Goal of Therapy:  Heparin level 0.3-0.7 units/ml Monitor platelets by anticoagulation protocol: Yes   Plan:  Continue heparin infusion at 1000 units/hr F/u daily heparin level and CBC Will follow plans post cath  Hildred Laser, Pharm D 10/28/2015 10:28 AM

## 2015-10-28 NOTE — Interval H&P Note (Signed)
History and Physical Interval Note:  10/28/2015 5:08 PM  Thomas [name]  has presented today for surgery, with the diagnosis of cp  The various methods of treatment have been discussed with the patient and family. After consideration of risks, benefits and other options for treatment, the patient has consented to  Procedure(s): Left Heart Cath and Coronary Angiography (N/A) and possible PCI  as a surgical intervention .  The patient's history has been reviewed, patient examined, no change in status, stable for surgery.  I have reviewed the patient's chart and labs.  Questions were answered to the patient's satisfaction.   Cath Lab Visit (complete for each Cath Lab visit)  Clinical Evaluation Leading to the Procedure:   ACS: Yes.    Non-ACS:    Anginal Classification: CCS IV  Anti-ischemic medical therapy: Minimal Therapy (1 class of medications)  Non-Invasive Test Results: No non-invasive testing performed  Prior CABG: No previous CABG        Adrian Prows

## 2015-10-29 ENCOUNTER — Encounter (HOSPITAL_COMMUNITY): Payer: Self-pay | Admitting: Cardiology

## 2015-10-29 LAB — CBC
HEMATOCRIT: 40.2 % (ref 39.0–52.0)
HEMOGLOBIN: 13.2 g/dL (ref 13.0–17.0)
MCH: 34.5 pg — ABNORMAL HIGH (ref 26.0–34.0)
MCHC: 32.8 g/dL (ref 30.0–36.0)
MCV: 105 fL — AB (ref 78.0–100.0)
Platelets: 133 10*3/uL — ABNORMAL LOW (ref 150–400)
RBC: 3.83 MIL/uL — ABNORMAL LOW (ref 4.22–5.81)
RDW: 12.7 % (ref 11.5–15.5)
WBC: 3.1 10*3/uL — ABNORMAL LOW (ref 4.0–10.5)

## 2015-10-29 MED ORDER — ISOSORBIDE MONONITRATE ER 30 MG PO TB24: 30.0000 mg | ORAL_TABLET | Freq: Every day | ORAL | Status: DC

## 2015-10-29 MED ORDER — TICAGRELOR 60 MG PO TABS: 60.0000 mg | ORAL_TABLET | Freq: Two times a day (BID) | ORAL | Status: DC

## 2015-10-29 MED ORDER — ATORVASTATIN CALCIUM 80 MG PO TABS: 80.0000 mg | ORAL_TABLET | Freq: Every day | ORAL | Status: DC

## 2015-10-29 MED ORDER — NITROGLYCERIN 0.4 MG SL SUBL: 0.4000 mg | SUBLINGUAL_TABLET | SUBLINGUAL | Status: DC | PRN

## 2015-10-29 NOTE — Care Management Important Message (Signed)
Important Message  Patient Details  Name: Thomas [name] MRN: [id number] Date of Birth: 28-Apr-1981   Medicare Important Message Given:  Yes    Nathen May 10/29/2015, 10:58 AM

## 2015-10-29 NOTE — Progress Notes (Signed)
Order received to discharge.  Telemetry removed and CCMD notified.  IV removed with catheter intact.  Discharge education given to Pt.  Extension teaching given on new medication Imdur and change in dosage strength of cholesterol medication.  Also teaching given on nitro tabs.  Pt states he fills his prescriptions and takes all medications as directed.  Pt indicates understanding of change of medication for chest pain.  Pt denies chest pain at this time.  Pt stable at discharge.

## 2015-10-29 NOTE — Discharge Instructions (Signed)
Increase atorvastatin to 80mg  at least for 3-6 months  Begin Brilinta 60mg  twice a day in addition to Aspirin 81mg   Begin Isosorbide 30mg  for chest pain. This is a long acting Nitroglycerin that should help prevent chest pain\  Take sublingual nitroglycerin as needed for any recurrent chest pain  Acute Coronary Syndrome Acute coronary syndrome (ACS) is a serious problem in which there is suddenly not enough blood and oxygen supplied to the heart. ACS may mean that one or more of the blood vessels in your heart (coronary arteries) may be blocked. ACS can result in chest pain or a heart attack (myocardial infarction or MI). CAUSES This condition is caused by atherosclerosis, which is the buildup of fat and cholesterol (plaque) on the inside of the arteries. Over time, the plaque may narrow or block the artery, and this will lessen blood flow to the heart. Plaque can also become weak and break off within a coronary artery to form a clot and cause a sudden blockage. RISK FACTORS The risks factors of this condition include:  High cholesterol levels.  High blood pressure (hypertension).  Smoking.  Diabetes.  Age.  Family history of chest pain, heart disease, or stroke.  Lack of exercise. SYMPTOMS The most common signs of this condition include:  Chest pain, which can be:  A crushing or squeezing in the chest.  A tightness, pressure, fullness, or heaviness in the chest.  Present for more than a few minutes, or it can stop and recur.  Pain in the arms, neck, jaw, or back.  Unexplained heartburn or indigestion.  Shortness of breath.  Nausea.  Sudden cold sweats.  Feeling light-headed or dizzy. Sometimes, this condition has no symptoms. DIAGNOSIS ACS may be diagnosed through the following tests:  Electrocardiogram (ECG).  Blood tests.  Coronary angiogram. This is a procedure to look at the coronary arteries to see if there is any blockage. TREATMENT Treatment for  ACS may include:  Healthy behavioral changes to reduce or control risk factors.  Medicine.  Coronary stenting.A stent helps to keep an artery open.  Coronary angioplasty. This procedure widens a narrowed or blocked artery.  Coronary artery bypass surgery. This will allow your blood to pass the blockage (bypass) to reach your heart. HOME CARE INSTRUCTIONS Eating and Drinking  Follow a heart-healthy diet. A dietitian can you help to educate you about healthy food options and changes.  Use healthy cooking methods such as roasting, grilling, broiling, baking, poaching, steaming, or stir-frying. Talk to a dietitian to learn more about healthy cooking methods. Medicines  Take medicines only as directed by your health care provider.  Do not take the following medicines unless your health care provider approves:  Nonsteroidal anti-inflammatory drugs (NSAIDs), such as ibuprofen, naproxen, or celecoxib.  Vitamin supplements that contain vitamin A, vitamin E, or both.  Hormone replacement therapy that contains estrogen with or without progestin.  Stop illegal drug use. Activities  Follow an exercise program that is approved by your health care provider.  Plan rest periods when you are fatigued. Lifestyle  Do not use any tobacco products, including cigarettes, chewing tobacco, or electronic cigarettes. If you need help quitting, ask your health care provider.  If you drink alcohol, and your health care provider approves, limit your alcohol intake to no more than 1 drink per day. One drink equals 12 ounces of beer, 5 ounces of wine, or 1 ounces of hard liquor.  Learn to manage stress.  Maintain a healthy weight. Lose weight  as approved by your health care provider. General Instructions  Manage other health conditions, such as hypertension and diabetes, as directed by your health care provider.  Keep all follow-up visits as directed by your health care provider. This is  important.  Your health care provider may ask you to monitor your blood pressure. A blood pressure reading consists of a higher number over a lower number, such as 110 over 72, written as 110/72. Ideally, your blood pressure should be:  Below 140/90 if you have no other medical conditions.  Below 130/80 if you have diabetes or kidney disease. SEEK IMMEDIATE MEDICAL CARE IF:  You have pain in your chest, neck, arm, jaw, stomach, or back that lasts more than a few minutes, is recurring, or is not relieved by taking medicine under your tongue (sublingual nitroglycerin).  You have profuse sweating without cause.  You have unexplained:  Heartburn or indigestion.  Shortness of breath or difficulty breathing.  Nausea or vomiting.  Fatigue.  Feelings of nervousness or anxiety.  Weakness.  Diarrhea.  You have sudden light-headedness or dizziness.  You faint. These symptoms may represent a serious problem that is an emergency. Do not wait to see if the symptoms will go away. Get medical help right away. Call your local emergency services (911 in the [place] Do not drive yourself to the clinic or hospital.   This information is not intended to replace advice given to you by your health care provider. Make sure you discuss any questions you have with your health care provider.   Document Released: 07/10/2005 Document Revised: 07/31/2014 Document Reviewed: 11/11/2013 Elsevier Interactive Patient Education [employer].

## 2015-10-29 NOTE — Discharge Summary (Signed)
Physician Discharge Summary  Patient ID: Thomas [name] MRN: [id number] DOB/AGE: Jun 03, 1981 35 y.o.  Admit date: 10/27/2015 Discharge date: 10/29/2015  Discharge Diagnoses: 1. NSTEMI 2. CAD with documented coronary spasm (05/19/2014: PTCA and Stentingof the OM1 and proximal Circumflex with a 3.0 x 16 mm Promus Premier DES, scoring balloon angioplasty of the mid and distal circumflex quality artery with a 2.5 x 10 mm Angiosculpt, balloon angioplasty of the mid segment of the circumflex coronary artery with a 2.0 x 15 mm Euphora) 3. CKD Stage IV 4. Mild hyperlipidemia with low HDL 5. HIV 6. History of Hodgin's Lymphoma  Significant Diagnostic Studies: Coronary angiogram 10/28/2015:  1. Severe diffuse coronary artery disease. Small vessels. 2. Previously placed stent in the high OM branch, that protrudes into the native ostial circumflex coronary artery is widely patent. The native circumflex mid to distal segment has diffuse disease. There is tandem 60 and 70% stenosis which in some views appears to be 80%. Small vessel and diffusely diseased. 3. Diffuse coronary artery disease involving the RCA specifically PL branch of the RCA which is diffusely diseased and small. Has 70% diffuse disease. Proximal RCA has 40% stenosis. Catheter induced spasm was evident relieved with intracoronary nitroglycerin at this site. 4. Mild disease in the LAD.  Hospital Course: Thomas [name] is a 35 y.o. male with a history of HIV, CKD, Hodgkin's lymphoma, and CAD s/p PTCA and stenting in 2015 who presented on 10/27/2015 with substernal chest pain that began while taking out his trash. Symptoms lasted for approximately 20 minutes and resolved after EMS administered ASA. He reports associated nausea and flushing without SOB. Troponin minimally elevated. No acute EKG changes. Given risk factors and symptoms, and abnormal labs, recommended coronary angiogram for reevaluation of coronary anatomy. Cath revealed severe  small vessel disease, 70% stenosis of the mid to distal Cx, and diffuse disease of the RCA with catheter induced coronary spasm, relieved by intracoronary Ntg.  He has not had any recurrence of chest pain during hospitalization.   Recommendations on discharge:  Recommend treating with aggressive risk factor reduction including high dose, high intensity statin, dual antiplatelet therapy, and nitrates.  If he continues to have exertional chest pain, can consider angioplasty of the circumflex coronary artery.  We'll see him back in the office in 2 weeks for reevaluation and further recommendations.  Discharge Exam: Blood pressure 107/63, pulse 55, temperature 98.2 F (36.8 C), temperature source Oral, resp. rate 16, height 6\' 1"  (1.854 m), weight 74.9 kg (165 lb 2 oz), SpO2 100 %.    General appearance: alert, cooperative, appears stated age and no distress Eyes: negative Neck: no adenopathy, no carotid bruit, no JVD, supple, symmetrical, trachea midline and thyroid not enlarged, symmetric, no tenderness/mass/nodules Resp: clear to auscultation bilaterally Chest wall: no tenderness Cardio: regular rate and rhythm, S1, S2 normal, no murmur, click, rub or gallop GI: soft, non-tender; bowel sounds normal; no masses, no organomegaly Extremities: extremities normal, atraumatic, no cyanosis or edema Pulses: 2+ and symmetric, right radial access site asymptomatic Skin: Skin color, texture, turgor normal. No rashes or lesions Neurologic: Grossly normal  Labs:   Lab Results  Component Value Date   WBC 3.1* 10/29/2015   HGB 13.2 10/29/2015   HCT 40.2 10/29/2015   MCV 105.0* 10/29/2015   PLT 133* 10/29/2015    Recent Labs Lab 10/28/15 1320  NA 140  K 4.2  CL 110  CO2 23  BUN 13  CREATININE 2.08*  CALCIUM 8.5*  GLUCOSE 88  Lipid Panel     Component Value Date/Time   CHOL 119 10/28/2015 0509   TRIG 62 10/28/2015 0509   HDL 36* 10/28/2015 0509   CHOLHDL 3.3 10/28/2015 0509   VLDL  12 10/28/2015 0509   LDLCALC 71 10/28/2015 0509    BNP (last 3 results) No results for input(s): BNP in the last 8760 hours.  HEMOGLOBIN A1C Lab Results  Component Value Date   HGBA1C 5.3 12/10/2014   MPG 105 12/10/2014    Cardiac Panel (last 3 results)  Recent Labs  10/27/15 2301 10/28/15 0507 10/28/15 1032  TROPONINI 0.22* 0.10* 0.03    Lab Results  Component Value Date   CKTOTAL 3146* 05/19/2014   TROPONINI 0.03 10/28/2015     TSH  Recent Labs  12/10/14 2245 10/27/15 2301  TSH 1.437 1.993    EKG 10/29/2015: Sinus bradycardia at a rate of 55 bpm, normal axis, normal intervals, no evidence of ischemia.   Radiology: Dg Chest 2 View  10/27/2015  CLINICAL DATA:  Sudden onset of mid chest pain today. EXAM: CHEST  2 VIEW COMPARISON:  12/10/2014 FINDINGS: The heart size and mediastinal contours are within normal limits. Both lungs are clear. The visualized skeletal structures are unremarkable. IMPRESSION: No active cardiopulmonary disease. Electronically Signed   By: Rolm Baptise M.D.   On: 10/27/2015 15:10      FOLLOW UP PLANS AND APPOINTMENTS Discharge Instructions    Discharge patient    Complete by:  As directed             Medication List    TAKE these medications        abacavir-dolutegravir-lamiVUDine 600-50-300 MG tablet  Commonly known as:  TRIUMEQ  Take 1 tablet by mouth daily.     TRIUMEQ 600-50-300 MG tablet  Generic drug:  abacavir-dolutegravir-lamiVUDine  TAKE 1 TABLET BY MOUTH DAILY.     aspirin 81 MG tablet  Take 81 mg by mouth daily.     atorvastatin 80 MG tablet  Commonly known as:  LIPITOR  Take 1 tablet (80 mg total) by mouth daily.     docusate sodium 100 MG capsule  Commonly known as:  COLACE  Take 1 capsule (100 mg total) by mouth 2 (two) times daily.     feeding supplement (ENSURE COMPLETE) Liqd  Take 237 mLs by mouth 3 (three) times daily between meals.     ferrous sulfate 325 (65 FE) MG tablet  TAKE 1 TABLET (325 MG  TOTAL) BY MOUTH TWO   (TWO) TIMES DAILY WITH A MEAL.     isosorbide mononitrate 30 MG 24 hr tablet  Commonly known as:  IMDUR  Take 1 tablet (30 mg total) by mouth daily.     metoprolol tartrate 25 MG tablet  Commonly known as:  LOPRESSOR  Take 1 tablet (25 mg total) by mouth 2 (two) times daily.     nitroGLYCERIN 0.4 MG SL tablet  Commonly known as:  NITROSTAT  Place 1 tablet (0.4 mg total) under the tongue every 5 (five) minutes x 3 doses as needed for chest pain.     oxyCODONE 5 MG immediate release tablet  Commonly known as:  ROXICODONE  Take 1 tablet (5 mg total) by mouth every 4 (four) hours as needed for severe pain.     ticagrelor 60 MG Tabs tablet  Commonly known as:  BRILINTA  Take 1 tablet (60 mg total) by mouth 2 (two) times daily.  Follow-up Information    Call Adrian Prows, MD.   Specialty:  Cardiology   Why:  To be seen in 2 weeks   Contact information:   [address] [place] [address] [phone]        Rachel Bo, NP-C 10/29/2015, 7:33 AM [facility] Pager: [phone] Office: [phone]

## 2015-11-11 DIAGNOSIS — I2510 Atherosclerotic heart disease of native coronary artery without angina pectoris: Secondary | ICD-10-CM | POA: Diagnosis not present

## 2015-11-11 DIAGNOSIS — B20 Human immunodeficiency virus [HIV] disease: Secondary | ICD-10-CM | POA: Diagnosis not present

## 2015-11-11 DIAGNOSIS — N184 Chronic kidney disease, stage 4 (severe): Secondary | ICD-10-CM | POA: Diagnosis not present

## 2015-11-11 DIAGNOSIS — Z8571 Personal history of Hodgkin lymphoma: Secondary | ICD-10-CM | POA: Diagnosis not present

## 2015-12-30 ENCOUNTER — Other Ambulatory Visit: Payer: Medicare Other

## 2015-12-30 ENCOUNTER — Other Ambulatory Visit: Payer: Self-pay | Admitting: Internal Medicine

## 2015-12-30 ENCOUNTER — Other Ambulatory Visit (HOSPITAL_COMMUNITY)
Admission: RE | Admit: 2015-12-30 | Discharge: 2015-12-30 | Disposition: A | Discharge status: Home / Self Care | Payer: Medicare Other | Source: Ambulatory Visit | Attending: Internal Medicine | Admitting: Internal Medicine

## 2015-12-30 ENCOUNTER — Telehealth: Payer: Self-pay

## 2015-12-30 ENCOUNTER — Other Ambulatory Visit: Payer: Self-pay

## 2015-12-30 DIAGNOSIS — B20 Human immunodeficiency virus [HIV] disease: Secondary | ICD-10-CM

## 2015-12-30 DIAGNOSIS — Z113 Encounter for screening for infections with a predominantly sexual mode of transmission: Secondary | ICD-10-CM | POA: Diagnosis not present

## 2015-12-30 LAB — CBC WITH DIFFERENTIAL/PLATELET
BASOS ABS: 0 {cells}/uL (ref 0–200)
Basophils Relative: 0 %
EOS ABS: 46 {cells}/uL (ref 15–500)
Eosinophils Relative: 1 %
HEMATOCRIT: 39.3 % (ref 38.5–50.0)
HEMOGLOBIN: 13.3 g/dL (ref 13.2–17.1)
LYMPHS ABS: 1564 {cells}/uL (ref 850–3900)
Lymphocytes Relative: 34 %
MCH: 34.7 pg — AB (ref 27.0–33.0)
MCHC: 33.8 g/dL (ref 32.0–36.0)
MCV: 102.6 fL — AB (ref 80.0–100.0)
MPV: 10.6 fL (ref 7.5–12.5)
Monocytes Absolute: 322 cells/uL (ref 200–950)
Monocytes Relative: 7 %
NEUTROS PCT: 58 %
Neutro Abs: 2668 cells/uL (ref 1500–7800)
Platelets: 171 10*3/uL (ref 140–400)
RBC: 3.83 MIL/uL — ABNORMAL LOW (ref 4.20–5.80)
RDW: 14.4 % (ref 11.0–15.0)
WBC: 4.6 10*3/uL (ref 3.8–10.8)

## 2015-12-30 NOTE — Telephone Encounter (Signed)
Patient walked into clinic stating he needed medication. Patient stated taking his last Truimeq pill today. Patient stated taking his medication everyday, regardless of missed appointments. Patient last doctor visit 03/2014. Labs done in 2016. Patient asked why would he need to come if he was undetectable. Explained to patient that he needs to come in to have Dr. Linus Salmons evaluate his medication to make sure there is no resistance, evaluate his viral load, and to have yearly STD labs done. Labs drawn today. One month supply of Truimeq sent to pharmacy. Patient stated understanding that he has to make his upcoming appointment in order to receive more medication. Appointment made with Dr. Linus Salmons for July 13 @ 3:15pm. Afternoon appointment made at patient's request because he can get a ride. Appointment reminder given. Rodman Key, LPN

## 2015-12-31 LAB — COMPLETE METABOLIC PANEL WITH GFR
ALBUMIN: 4.2 g/dL (ref 3.6–5.1)
ALK PHOS: 67 U/L (ref 40–115)
ALT: 17 U/L (ref 9–46)
AST: 13 U/L (ref 10–40)
BILIRUBIN TOTAL: 0.3 mg/dL (ref 0.2–1.2)
BUN: 20 mg/dL (ref 7–25)
CALCIUM: 9.4 mg/dL (ref 8.6–10.3)
CO2: 23 mmol/L (ref 20–31)
CREATININE: 1.96 mg/dL — AB (ref 0.60–1.35)
Chloride: 108 mmol/L (ref 98–110)
GFR, EST AFRICAN AMERICAN: 51 mL/min — AB (ref >=60)
GFR, Est Non African American: 44 mL/min — ABNORMAL LOW (ref >=60)
Glucose, Bld: 88 mg/dL (ref 65–99)
Potassium: 4.3 mmol/L (ref 3.5–5.3)
Sodium: 142 mmol/L (ref 135–146)
TOTAL PROTEIN: 7.2 g/dL (ref 6.1–8.1)

## 2015-12-31 LAB — HIV-1 RNA QUANT-NO REFLEX-BLD

## 2015-12-31 LAB — T-HELPER CELL (CD4) - (RCID CLINIC ONLY)
CD4 T CELL HELPER: 32 % — AB (ref 33–55)
CD4 T Cell Abs: 550 /uL (ref 400–2700)

## 2015-12-31 LAB — RPR

## 2016-01-03 LAB — URINE CYTOLOGY ANCILLARY ONLY
Chlamydia: NEGATIVE
NEISSERIA GONORRHEA: NEGATIVE

## 2016-01-11 IMAGING — MR MR HEAD WO/W CM
11 of 13 series · 27 of 48 positions shown · IV contrast (multihance)
Comparison: MRI of the thoracic spine and lumbar spine with
contrast 02/28/2014

CLINICAL DATA: Lymphoma. Question septic emboli to the brain.
Abnormal MRI of the thoracic spine with evidence of leptomeningeal
enhancement in cord enhancement.

EXAM:
MRI HEAD WITHOUT AND WITH CONTRAST
TECHNIQUE: Multiplanar, multiecho pulse sequences of the brain and surrounding
structures were obtained without and with intravenous contrast.
CONTRAST:  13mL MULTIHANCE GADOBENATE DIMEGLUMINE 529 MG/ML IV SOLN

[Series 2: FLAIR · sagittal · 5.0mm · 0.47mm/px · 1 of 22 slices shown (1 of 2)]
[im 1/22]
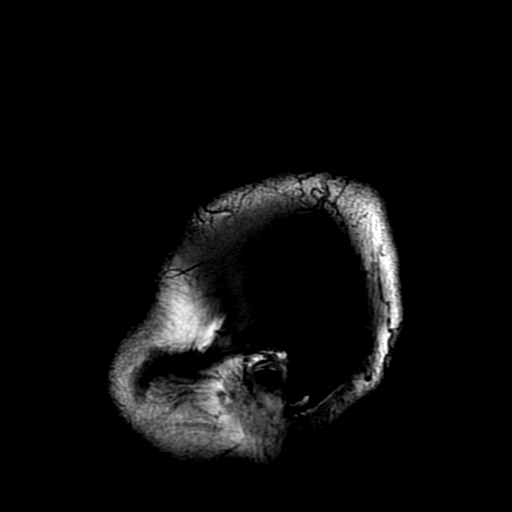

[Series 4: DWI · axial · 5.0mm · 1.02mm/px · z∈[-91,+58]mm · 3 of 62 slices shown (1 of 4)]
[im 1/62]
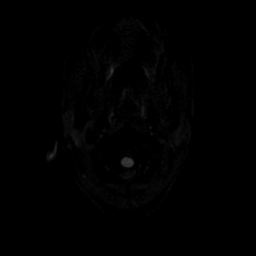
[im 31/62]
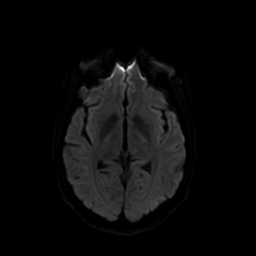
[im 62/62]
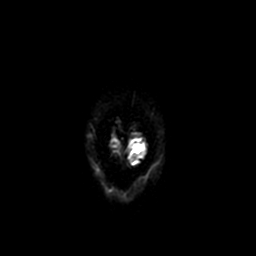

[Series 5: T2 · axial · 5.0mm · 0.43mm/px · z∈[-89,+59]mm · 2 of 26 slices shown (1 of 2)]
[im 1/26]
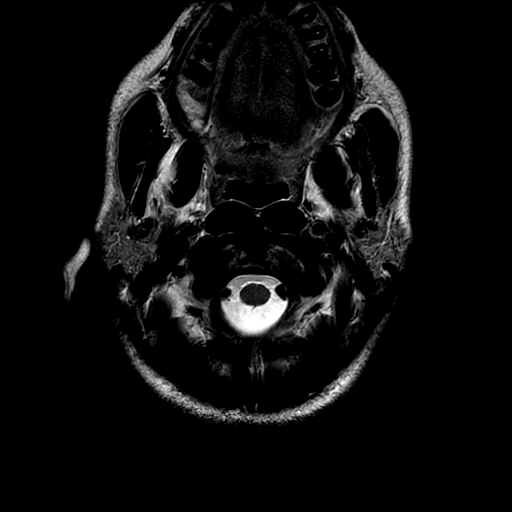
[im 26/26]
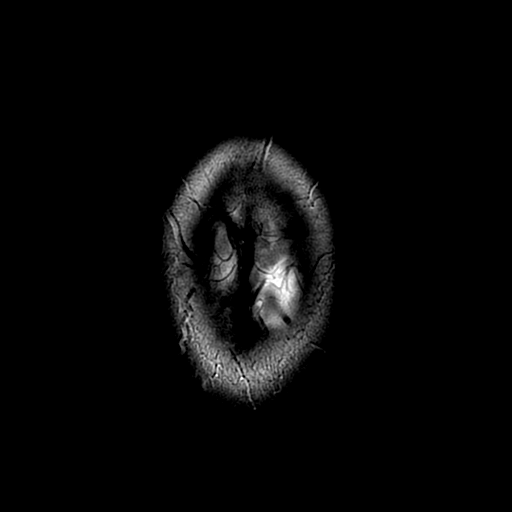

[Series 9: DWI · coronal · 5.0mm · 1.02mm/px · 5 of 70 slices shown (2 of 4)]
[im 1/70]
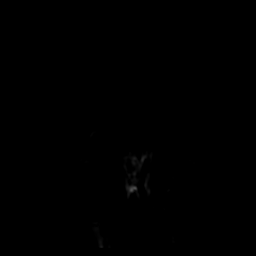
[im 18/70]
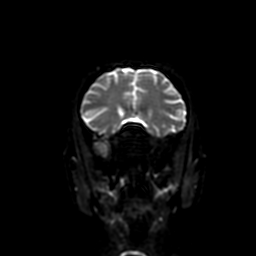
[im 35/70]
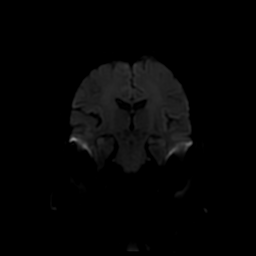
[im 52/70]
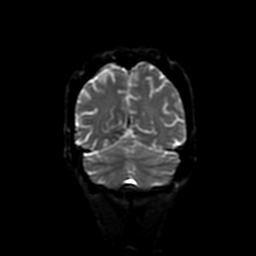
[im 70/70]
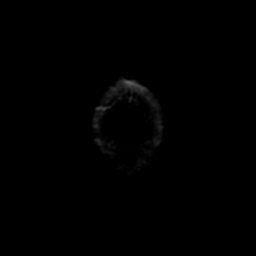

[Series 10: FLAIR · axial · 5.0mm · 0.43mm/px · z∈[-90,+59]mm · 2 of 26 slices shown (2 of 2)]
[im 1/26]
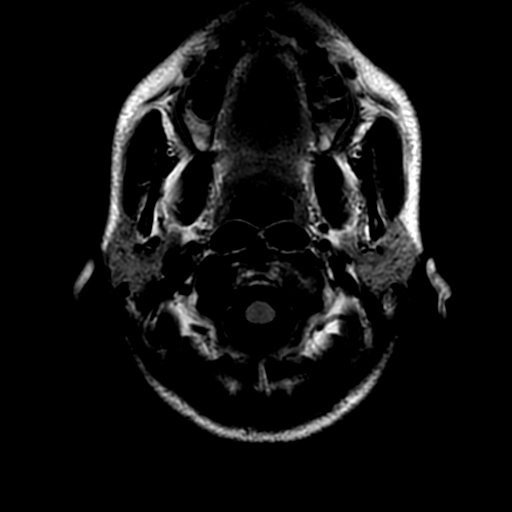
[im 26/26]
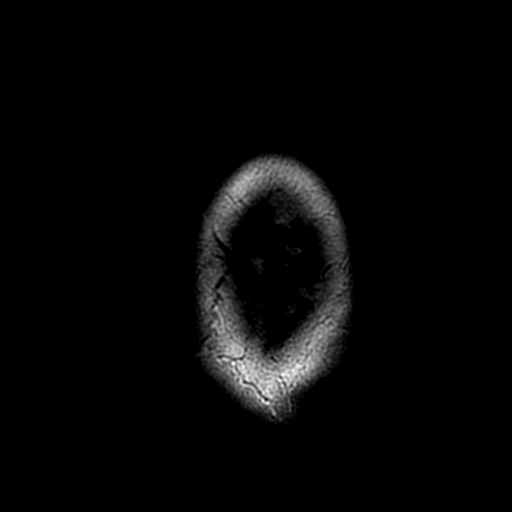

[Series 11: (person_name) · axial · 3.6mm · 0.47mm/px · z∈[-87,-31]mm · 4 of 160 slices shown]
[im 1/160]
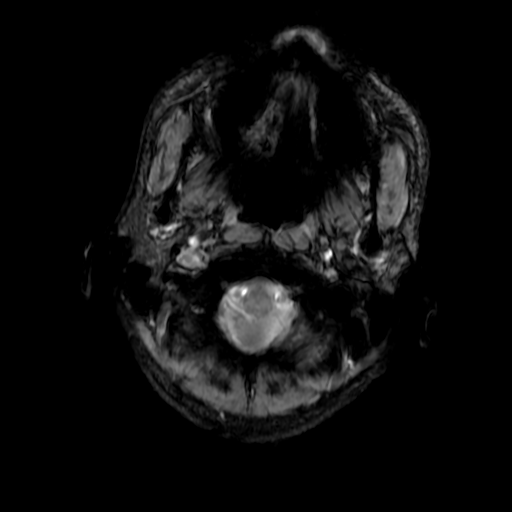
[im 32/160]
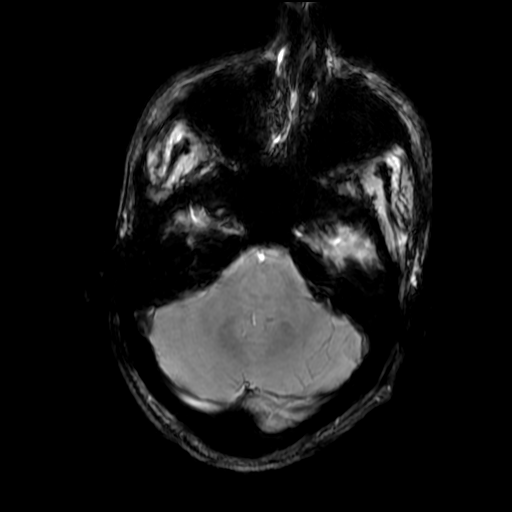
[im 48/160]
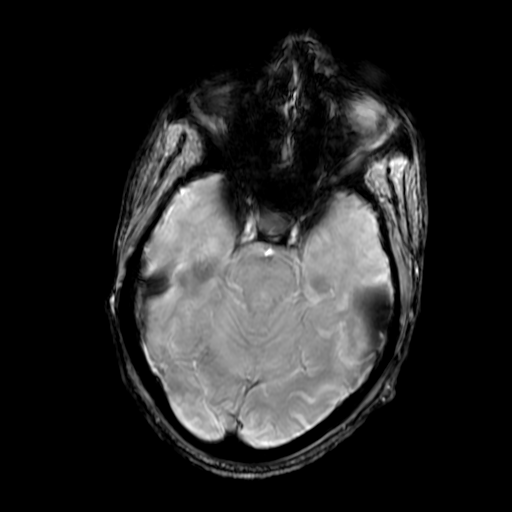
[im 64/160]
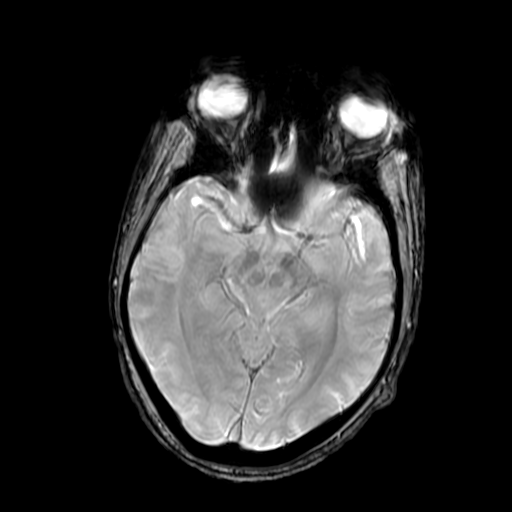

[Series 14: T1 · axial · 5.0mm · 0.43mm/px · z∈[-96,+45]mm · 2 of 25 slices shown (1 of 2)]
[im 1/25]
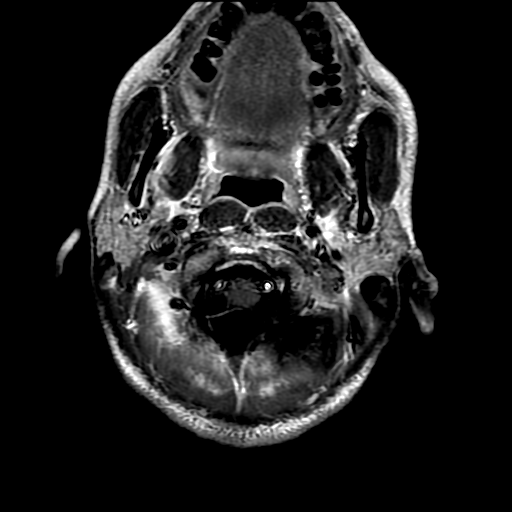
[im 25/25]
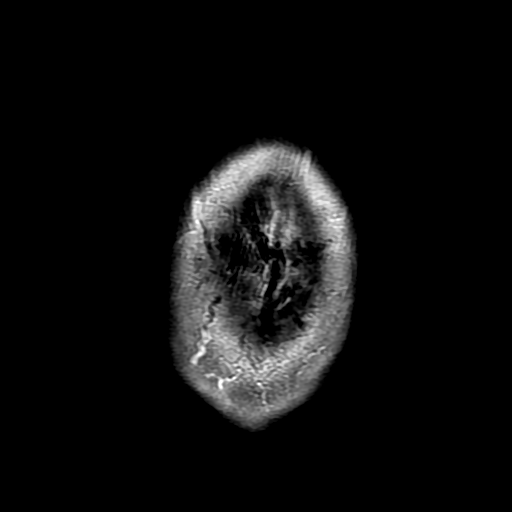

[Series 15: T1 · coronal · 5.0mm · 0.43mm/px · 2 of 28 slices shown (2 of 2)]
[im 1/28]
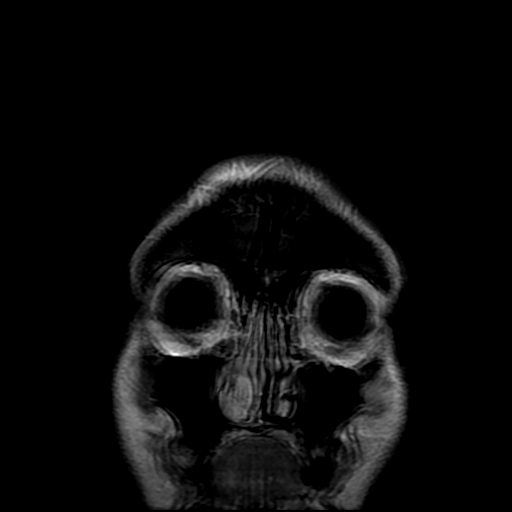
[im 28/28]
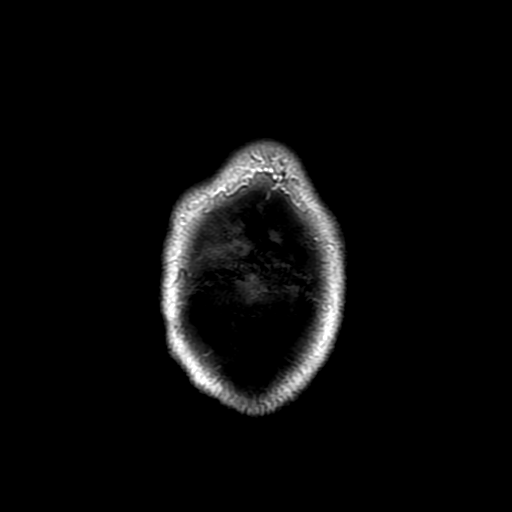

[Series 16: T2 · coronal · 5.0mm · 0.47mm/px · 2 of 30 slices shown (2 of 2)]
[im 1/30]
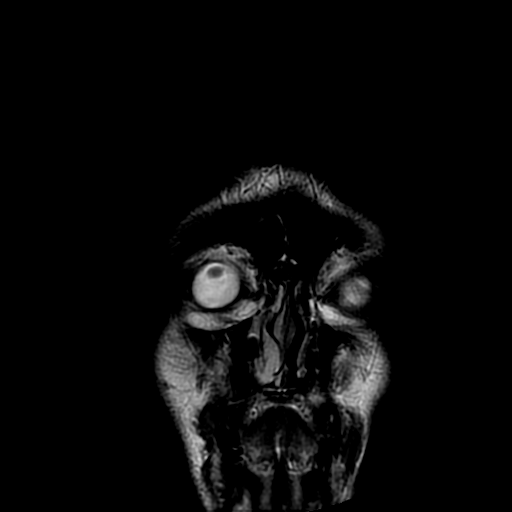
[im 30/30]
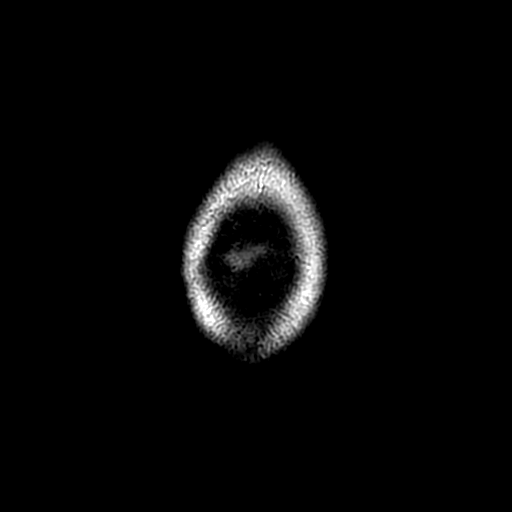

[Series 400: DWI · axial · 5.0mm · 1.02mm/px · z∈[-91,+58]mm · 2 of 31 slices shown (3 of 4)]
[im 1/31]
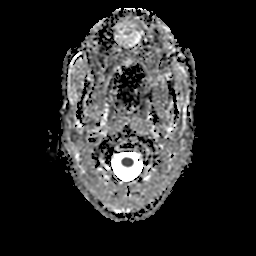
[im 31/31]
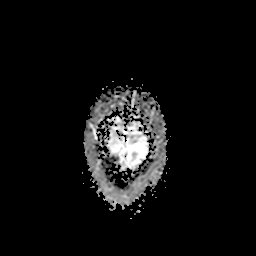

[Series 900: DWI · coronal · 5.0mm · 1.02mm/px · 2 of 35 slices shown (4 of 4)]
[im 1/35]
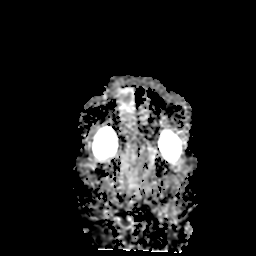
[im 35/35]
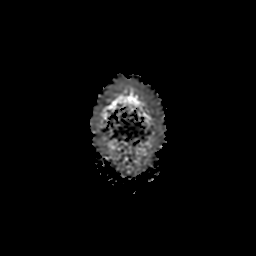

[27 of 48 positions shown; findings below may reference images not displayed]

FINDINGS: Mild diffuse leptomeningeal enhancement is evident. No focal dural
or parenchymal enhancement is present.

No hemorrhage or mass lesion is present. There is no acute infarct.
No significant white matter disease is evident.

The ventricles are of normal size. No significant extraaxial fluid
collection is present.

Flow is present in the major intracranial arteries. The globes and
orbits are intact. The paranasal sinuses and mastoid air cells are
clear.
IMPRESSION: 1. Mild diffuse leptomeningeal enhancement compatible with
leptomeningeal disease. This could however, be result of the recent
lumbar puncture.
2. No parenchymal or dural enhancement.
3. Otherwise normal MRI of the brain.

## 2016-01-30 ENCOUNTER — Other Ambulatory Visit: Payer: Self-pay | Admitting: Internal Medicine

## 2016-01-30 MED ORDER — ABACAVIR-DOLUTEGRAVIR-LAMIVUD 600-50-300 MG PO TABS: 1.0000 | ORAL_TABLET | Freq: Every day | ORAL | Status: DC

## 2016-02-03 ENCOUNTER — Ambulatory Visit: Payer: Medicare Other | Admitting: Internal Medicine

## 2016-02-14 ENCOUNTER — Other Ambulatory Visit: Payer: Self-pay | Admitting: Internal Medicine

## 2016-02-16 ENCOUNTER — Emergency Department (HOSPITAL_COMMUNITY): Payer: Medicare Other

## 2016-02-16 ENCOUNTER — Encounter (HOSPITAL_COMMUNITY): Payer: Self-pay | Admitting: Emergency Medicine

## 2016-02-16 ENCOUNTER — Emergency Department (HOSPITAL_COMMUNITY)
Admission: EM | Admit: 2016-02-16 | Discharge: 2016-02-17 | Disposition: A | Discharge status: Home / Self Care | Payer: Medicare Other | Attending: Emergency Medicine | Admitting: Emergency Medicine

## 2016-02-16 DIAGNOSIS — Z79899 Other long term (current) drug therapy: Secondary | ICD-10-CM | POA: Insufficient documentation

## 2016-02-16 DIAGNOSIS — Z8572 Personal history of non-Hodgkin lymphomas: Secondary | ICD-10-CM | POA: Diagnosis not present

## 2016-02-16 DIAGNOSIS — Z21 Asymptomatic human immunodeficiency virus [HIV] infection status: Secondary | ICD-10-CM | POA: Diagnosis not present

## 2016-02-16 DIAGNOSIS — N452 Orchitis: Secondary | ICD-10-CM | POA: Diagnosis not present

## 2016-02-16 DIAGNOSIS — N189 Chronic kidney disease, unspecified: Secondary | ICD-10-CM | POA: Insufficient documentation

## 2016-02-16 DIAGNOSIS — N50812 Left testicular pain: Secondary | ICD-10-CM | POA: Diagnosis not present

## 2016-02-16 DIAGNOSIS — Z87891 Personal history of nicotine dependence: Secondary | ICD-10-CM | POA: Diagnosis not present

## 2016-02-16 LAB — URINE MICROSCOPIC-ADD ON

## 2016-02-16 LAB — URINALYSIS, ROUTINE W REFLEX MICROSCOPIC
Glucose, UA: NEGATIVE mg/dL
KETONES UR: NEGATIVE mg/dL
Leukocytes, UA: NEGATIVE
Nitrite: NEGATIVE
PROTEIN: 100 mg/dL — AB
Specific Gravity, Urine: 1.032 — ABNORMAL HIGH (ref 1.005–1.030)
pH: 6 (ref 5.0–8.0)

## 2016-02-16 MED ORDER — CEFTRIAXONE SODIUM 250 MG IJ SOLR
250.0000 mg | Freq: Once | INTRAMUSCULAR | Status: AC
  Administered 2016-02-17: 250 mg via INTRAMUSCULAR
  Filled 2016-02-16: qty 250

## 2016-02-16 MED ORDER — OXYCODONE HCL 5 MG PO TABS
5.0000 mg | ORAL_TABLET | Freq: Once | ORAL | Status: AC
Start: 2016-02-17 — End: 2016-02-17
  Administered 2016-02-17: 5 mg via ORAL
  Filled 2016-02-16: qty 1

## 2016-02-16 MED ORDER — OXYCODONE HCL 5 MG PO TABS: 5.0000 mg | ORAL_TABLET | Freq: Four times a day (QID) | ORAL | 0 refills | Status: DC | PRN

## 2016-02-16 NOTE — ED Provider Notes (Signed)
[facility] DEPT Provider Note   CSN: [id number] Arrival date & time: 02/16/16  2014  First Provider Contact:  First MD Initiated Contact with Patient 02/16/16 2101     History   Chief Complaint Chief Complaint  Patient presents with  . Testicle Pain   HPI AMIIR WOODING is a 35 y.o. male.  HPI 35 y.o. male presents to the Emergency Department today complaining of left testicular pain for the past few days. No hx of the same. No trauma to the area. Notes TTP and with mobility. No pain at rest. OTC without relief. Pt sexually active. No dysuria. No discharge. No fevers. No CP/SOB/ABD pain. No N/V/D. No other symptoms noted.   Past Medical History:  Diagnosis Date  . Acute respiratory failure with hypoxemia: post cardiac arrest 05/19/2014  . Cancer ([place])    hodgkins, no current treatment for  . Cardiac arrest ([place]) 05/15/2014  . Chronic renal insufficiency   . CKD (chronic kidney disease) s/p Right nephrectomy (04/30/2014) 05/19/2014  . CLABSI (central line-associated bloodstream infection) 02/27/2014  . Enteritis 01/22/2014  . History of blood transfusion july 2015 per pt  . HIV disease ([place]) 06/25/2012  . Hydronephrosis, right & hydroureter: secondary to obstructing ureteral calculi 03/01/2014  . Leg pain 07/03/2012  . Nephrolithiasis   . NSTEMI (non-ST elevated myocardial infarction) ([place]) 05/19/2014  . Obstructive uropathy 12/12/2014   Status post left renal stent  . Penile cyst 08/06/2012  . Septic shock ([place]) 01/18/2014  . Shingles rash 02/25/2014  . Sinus tachycardia ([place]) 08/06/2012  . Skin lesion 07/03/2012  . Staphylococcus aureus bacteremia 02/25/2014  . Transaminitis 05/19/2014    Patient Active Problem List   Diagnosis Date Noted  . Acute coronary syndrome ([place]) 10/27/2015  . Unstable angina ([place]) 10/27/2015  . Obstructive uropathy 12/12/2014  . Nephrolithiasis 12/12/2014  . Acute on chronic renal failure ([place]) 12/11/2014  . Hyperlipidemia 12/10/2014  . CKD  (chronic kidney disease) s/p Right nephrectomy (04/30/2014) 05/19/2014  . Renal neoplasm 04/30/2014  . Acute renal failure ([place]) 02/23/2014  . Hypokalemia 01/22/2014  . Hodgkin's disease ([place]) 08/26/2013  . Iron deficiency anemia, unspecified 08/04/2013  . Penile cyst 08/06/2012  . HIV disease ([place]) 06/25/2012    Past Surgical History:  Procedure Laterality Date  . 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  this after several previous I and Ds of rectal abcess.   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 CARDIAC CATHETERIZATION  05/19/2014  . CARDIAC CATHETERIZATION N/A 10/28/2015   Procedure: Left Heart Cath and Coronary Angiography;  Surgeon: Adrian Prows, MD;  Location: [facility];  Service: Cardiovascular;  Laterality: N/A;  . CORONARY STENT PLACEMENT  05/19/2014   OMI   & PROXIMAL CIRCUMFLEX  . CYSTOSCOPY WITH RETROGRADE PYELOGRAM, URETEROSCOPY AND STENT PLACEMENT Left 12/11/2014   Procedure: CYSTOSCOPY WITH RETROGRADE PYELOGRAM, URETEROSCOPY, STONE REMOVAL, AND STENT PLACEMENT;  Surgeon: Kathie Rhodes, MD;  Location: [facility];  Service: Urology;  Laterality: Left;  . HOLMIUM LASER APPLICATION Left [id number]   Procedure: HOLMIUM LASER APPLICATION;  Surgeon: Kathie Rhodes, MD;  Location: [facility];  Service: Urology;  Laterality: Left;  . LAPAROSCOPIC NEPHRECTOMY Right 04/30/2014   Procedure: LAPAROSCOPIC NEPHRECTOMY AND URETERECTOMY;  Surgeon: Raynelle Bring, MD;  Location: [facility];  Service: Urology;  Laterality: Right;  . LEFT HEART CATHETERIZATION WITH CORONARY ANGIOGRAM N/A 05/18/2014   Procedure: LEFT HEART CATHETERIZATION WITH CORONARY ANGIOGRAM;  Surgeon: Laverda Page, MD;  Location: [facility];  Service: Cardiovascular;  Laterality: N/A;  . pac placed and later removed    . PERCUTANEOUS CORONARY STENT INTERVENTION (PCI-S) N/A 05/19/2014   Procedure: PERCUTANEOUS CORONARY STENT INTERVENTION (PCI-S);  Surgeon: Laverda Page, MD;  Location: [facility] CATH LAB;  Service: Cardiovascular;  Laterality: N/A;  . right nephrostomy tube  for last month and 1/2  . SUPRACLAVICAL NODE BIOPSY Right 08/08/2013   Procedure: SUPRACLAVICULAR LYMPH NODE BIOPSY;  Surgeon: Gaye Pollack, MD;  Location: [facility] OR;  Service: Thoracic;  Laterality: Right;  . TEE WITHOUT CARDIOVERSION N/A 02/27/2014   Procedure: TRANSESOPHAGEAL ECHOCARDIOGRAM (TEE);  Surgeon: Pixie Casino, MD;  Location: [facility];  Service: Cardiovascular;  Laterality: N/A;     Home Medications    Prior to Admission medications   Medication Sig Start Date End Date Taking? Authorizing Provider  abacavir-dolutegravir-lamiVUDine (TRIUMEQ) 600-50-300 MG tablet Take 1 tablet by mouth daily. 01/30/16  Yes Michel Bickers, MD  atorvastatin (LIPITOR) 80 MG tablet Take 1 tablet (80 mg total) by mouth daily. 10/29/15  Yes Neldon Labella, NP  docusate sodium (COLACE) 100 MG capsule Take 1 capsule (100 mg total) by mouth 2 (two) times daily. 04/30/14  Yes Raynelle Bring, MD  ferrous sulfate 325 (65 FE) MG tablet TAKE 1 TABLET (325 MG TOTAL) BY MOUTH TWO   (TWO) TIMES DAILY WITH A MEAL. 11/17/14  Yes [name], MD  isosorbide mononitrate (IMDUR) 30 MG 24 hr tablet Take 1 tablet (30 mg total) by mouth daily. 10/29/15  Yes Neldon Labella, NP  metoprolol tartrate (LOPRESSOR) 25 MG tablet Take 1 tablet (25 mg total) by mouth 2 (two) times daily. 05/21/14  Yes Annita Brod, MD  nitroGLYCERIN (NITROSTAT) 0.4 MG SL tablet Place 1 tablet (0.4 mg total) under the tongue every 5 (five) minutes x 3 doses as needed for chest pain. 10/29/15  Yes Neldon Labella, NP  oxyCODONE (ROXICODONE) 5 MG immediate release tablet Take 1 tablet (5 mg total) by mouth every 4 (four) hours as needed for severe pain. 04/30/14  Yes Raynelle Bring, MD  ticagrelor (BRILINTA) 60 MG TABS tablet Take 1 tablet (60 mg total) by mouth 2 (two) times daily. 10/29/15  Yes Neldon Labella, NP  feeding supplement, ENSURE  COMPLETE, (ENSURE COMPLETE) LIQD Take 237 mLs by mouth 3 (three) times daily between meals. Patient not taking: Reported on 02/16/2016 01/28/15   [name], MD    Family History Family History  Problem Relation Age of Onset  . Diabetes Mother     Social History Social History  Substance Use Topics  . Smoking status: Former Smoker    Packs/day: 1.00    Years: 15.00    Types: Cigarettes    Start date: 08/24/2013    Quit date: 09/21/2013  . Smokeless tobacco: Never Used     Comment: quitline info, counseling  . Alcohol use No   Allergies   Tylenol [acetaminophen]   Review of Systems Review of Systems ROS reviewed and all are negative for acute change except as noted in the HPI.  Physical Exam Updated Vital Signs BP 108/86 (BP Location: Left Arm)   Pulse 108   Temp 99.8 F (37.7 C) (Oral)   Resp 18   Ht 6\' 1"  (1.854 m)   Wt 72.2 kg   SpO2 97%   BMI 21.00 kg/m   Physical Exam  Constitutional: He is oriented to person, place, and time. Vital signs are normal. He appears well-developed and well-nourished.  HENT:  Head: Normocephalic.  Right Ear: Hearing normal.  Left Ear: Hearing normal.  Eyes: Conjunctivae and EOM are normal. Pupils are equal, round, and reactive to light.  Cardiovascular: Normal rate and regular rhythm.   Pulmonary/Chest: Effort normal.  Abdominal: Soft. Normal appearance. There is no tenderness. Hernia confirmed negative in the right inguinal area and confirmed negative in the left inguinal area.  Genitourinary: Right testis shows no swelling and no tenderness. Left testis shows swelling and tenderness.  Genitourinary Comments: No visible erythema. Mild swelling noted. TTP along posterior testicle.   Neurological: He is alert and oriented to person, place, and time.  Skin: Skin is warm and dry.  Psychiatric: He has a normal mood and affect. His speech is normal and behavior is normal. Thought content normal.   ED Treatments / Results  Labs (all  labs ordered are listed, but only abnormal results are displayed) Labs Reviewed  URINALYSIS, ROUTINE W REFLEX MICROSCOPIC (NOT AT [facility]) - Abnormal; Notable for the following:       Result Value   Color, Urine AMBER (*)    Specific Gravity, Urine 1.032 (*)    Hgb urine dipstick TRACE (*)    Bilirubin Urine SMALL (*)    Protein, ur 100 (*)    All other components within normal limits  URINE MICROSCOPIC-ADD ON - Abnormal; Notable for the following:    Squamous Epithelial / LPF 0-5 (*)    Bacteria, UA FEW (*)    All other components within normal limits   EKG  EKG Interpretation None      Radiology US Scrotum  Result Date: 02/16/2016 CLINICAL DATA:  Left-sided testicular pain for the past 5 days. Swelling. No known injury. EXAM: SCROTAL ULTRASOUND DOPPLER ULTRASOUND OF THE TESTICLES TECHNIQUE: Complete ultrasound examination of the testicles, epididymis, and other scrotal structures was performed. Color and spectral Doppler ultrasound were also utilized to evaluate blood flow to the testicles. COMPARISON:  None. FINDINGS: Right testicle Measurements: The right testicle is normal in size measuring 4.5 x 2.4 x 3.4 cm. There is an ill-defined approximately 0.5 x 1.0 x 1.2 cm area of relative decreased echogenicity within the peripheral mid aspect of the right testis (representative image 17). Left testicle Measurements: The left testis is normal in size measuring 3.7 x 2.3 x 3.9 cm. Note is made of an echogenic calcification within the superior aspect of the left testis which is favored to represent a scrotal pearl. The left testis appears mildly hyperemic in comparison to the right (image 5). Right epididymis: Normal in size and appearance measuring 1.0 x 1.1 x 1.1 cm. Left epididymis: Note is made of an approximately 1.2 x 0.8 x 0.9 cm anechoic epididymal cyst/spermatocele Hydrocele:  None visualized. Varicocele:  None visualized. Pulsed Doppler interrogation of both testes demonstrates normal low  resistance arterial and venous waveforms bilaterally. As above, the left testis appears mildly hyperemic in comparison to the right. IMPRESSION: 1. No evidence of testicular torsion. 2. Mild apparent hyperemia of the left testis in comparison to the right, nonspecific though could be seen in the setting of orchitis. 3. Ill-defined area of relative hypo echogenicity within the right testis, nonspecific though could potentially be indicative of developing infection, however a discrete testicular mass is not excluded on the basis of this examination. As such, a follow-up testicular ultrasound in 6 weeks is recommended to ensure resolution Electronically Signed   By: Sandi Mariscal M.D.   On: 02/16/2016 22:12  [place] Art/ven Flow Abd Pelv Doppler  Result Date: 02/16/2016 CLINICAL DATA:  Left-sided testicular pain for the past 5 days. Swelling. No known injury. EXAM: SCROTAL ULTRASOUND DOPPLER ULTRASOUND OF THE TESTICLES TECHNIQUE: Complete ultrasound examination of the testicles, epididymis, and other scrotal structures was performed. Color and spectral Doppler ultrasound were also utilized to evaluate blood flow to the testicles. COMPARISON:  None. FINDINGS: Right testicle Measurements: The right testicle is normal in size measuring 4.5 x 2.4 x 3.4 cm. There is an ill-defined approximately 0.5 x 1.0 x 1.2 cm area of relative decreased echogenicity within the peripheral mid aspect of the right testis (representative image 17). Left testicle Measurements: The left testis is normal in size measuring 3.7 x 2.3 x 3.9 cm. Note is made of an echogenic calcification within the superior aspect of the left testis which is favored to represent a scrotal pearl. The left testis appears mildly hyperemic in comparison to the right (image 5). Right epididymis: Normal in size and appearance measuring 1.0 x 1.1 x 1.1 cm. Left epididymis: Note is made of an approximately 1.2 x 0.8 x 0.9 cm anechoic epididymal cyst/spermatocele Hydrocele:   None visualized. Varicocele:  None visualized. Pulsed Doppler interrogation of both testes demonstrates normal low resistance arterial and venous waveforms bilaterally. As above, the left testis appears mildly hyperemic in comparison to the right. IMPRESSION: 1. No evidence of testicular torsion. 2. Mild apparent hyperemia of the left testis in comparison to the right, nonspecific though could be seen in the setting of orchitis. 3. Ill-defined area of relative hypo echogenicity within the right testis, nonspecific though could potentially be indicative of developing infection, however a discrete testicular mass is not excluded on the basis of this examination. As such, a follow-up testicular ultrasound in 6 weeks is recommended to ensure resolution Electronically Signed   By: Sandi Mariscal M.D.   On: 02/16/2016 22:12   Procedures Procedures (including critical care time)  Medications Ordered in ED Medications - No data to display   Initial Impression / Assessment and Plan / ED Course  I have reviewed the triage vital signs and the nursing notes.  Pertinent labs & imaging results that were available during my care of the patient were reviewed by me and considered in my medical decision making (see chart for details).  Clinical Course   Final Clinical Impressions(s) / ED Diagnoses  I have reviewed and evaluated the relevant laboratory values I have reviewed and evaluated the relevant imaging studies.  I have reviewed the relevant previous healthcare records. I obtained HPI from historian.  ED Course:  Assessment: Pt is a 34yM who presents with left testicular pain for a few days. No trauma to area. No hx same. Sexually active. No dysuria or discharge. On exam, pt in NAD. Nontoxic/nonseptic appearing. VSS. Afebrile. Left testicle TTP. No erythema. Mild swelling. No hernia palpable. UA unremarkable. No discharge on exam. Scrotal US negative for Torsion. Suggestive of Ortchitis. Given 250 IM rocephin  in ED. Plan is to DC home with doxy and follow up to PCP. Given Oxycodone #5. Recommended Scrotal US in 6 weeks. At time of discharge, Patient is in no acute distress. Vital Signs are stable. Patient is able to ambulate. Patient able to tolerate PO.    Disposition/Plan:  DC Home Additional Verbal discharge instructions given and discussed with patient.  Pt Instructed to f/u with PCP in the next week for evaluation and treatment of symptoms. Return precautions given Pt acknowledges and agrees with plan  Supervising Physician Daleen Bo, MD  Final diagnoses:  Pain in left testicle  Orchitis    New Prescriptions New Prescriptions   No medications on file     Shary Decamp, PA-C 02/16/16 [place], MD 02/18/16 1251

## 2016-02-16 NOTE — Discharge Instructions (Signed)
Please read and follow all provided instructions.  Your diagnoses today include:  1. Orchitis   2. Pain in left testicle     Tests performed today include: Vital signs. See below for your results today.   Medications prescribed:  Take as prescribed   You have been prescribed a narcotic medication on an "as needed" basis. Take only as prescribed. Do not drive, operate any machinery or make any important decisions while taking this medication as it is sedating. It may cause constipation take over the counter stool softeners or add fiber to your diet to treat this (Metamucil, Psyllium Fiber, Colace, Miralax) Further refills will need to be obtained from your primary care doctor and will not be prescribed through the Emergency Department. You will test positive on most drug tests while taking this medciation.   Home care instructions:  Follow any educational materials contained in this packet.  Follow-up instructions: Please follow-up with your primary care provider for further evaluation of symptoms and treatment. Please get a follow up Ultrasound in 6 weeks.   Return instructions:  Please return to the Emergency Department if you do not get better, if you get worse, or new symptoms OR  - Fever (temperature greater than 101.59F)  - Bleeding that does not stop with holding pressure to the area    -Severe pain (please note that you may be more sore the day after your accident)  - Chest Pain  - Difficulty breathing  - Severe nausea or vomiting  - Inability to tolerate food and liquids  - Passing out  - Skin becoming red around your wounds  - Change in mental status (confusion or lethargy)  - New numbness or weakness    Please return if you have any other emergent concerns.  Additional Information:  Your vital signs today were: BP 99/80 (BP Location: Left Arm)    Pulse 89    Temp 99.8 F (37.7 C) (Oral)    Resp 18    Ht 6\' 1"  (1.854 m)    Wt 72.2 kg    SpO2 100%    BMI 21.00 kg/m    If your blood pressure (BP) was elevated above 135/85 this visit, please have this repeated by your doctor within one month. ---------------

## 2016-02-16 NOTE — ED Triage Notes (Signed)
Pt is c/o left testicle pain that started a couple of days ago  Pt denies any strenuous activity when pain started  Pt states the left testicle is swollen and tender to touch

## 2016-02-17 DIAGNOSIS — N452 Orchitis: Secondary | ICD-10-CM | POA: Diagnosis not present

## 2016-02-17 LAB — GC/CHLAMYDIA PROBE AMP (~~LOC~~) NOT AT ARMC
CHLAMYDIA, DNA PROBE: NEGATIVE
NEISSERIA GONORRHEA: NEGATIVE

## 2016-02-17 MED ORDER — STERILE WATER FOR INJECTION IJ SOLN
INTRAMUSCULAR | Status: AC
  Administered 2016-02-17
  Filled 2016-02-17: qty 10

## 2016-02-17 MED ORDER — DOXYCYCLINE HYCLATE 100 MG PO CAPS: 100.0000 mg | ORAL_CAPSULE | Freq: Two times a day (BID) | ORAL | 0 refills | Status: DC

## 2016-02-21 ENCOUNTER — Other Ambulatory Visit: Payer: Self-pay | Admitting: Internal Medicine

## 2016-02-21 NOTE — Telephone Encounter (Signed)
Made the pt an appt with Dr. Linus Salmons for 02/24/16 at 11:15AM.  Advised pt that he needs to keep this appointment and discuss refills of HIV medications

## 2016-02-24 ENCOUNTER — Ambulatory Visit (INDEPENDENT_AMBULATORY_CARE_PROVIDER_SITE_OTHER): Payer: Medicare Other | Admitting: Internal Medicine

## 2016-02-24 ENCOUNTER — Encounter: Payer: Self-pay | Admitting: Internal Medicine

## 2016-02-24 VITALS — BP 108/72 | HR 72 | Temp 98.0°F | Ht 73.0 in | Wt 159.0 lb

## 2016-02-24 DIAGNOSIS — C8100 Nodular lymphocyte predominant Hodgkin lymphoma, unspecified site: Secondary | ICD-10-CM

## 2016-02-24 DIAGNOSIS — N183 Chronic kidney disease, stage 3 (moderate): Secondary | ICD-10-CM | POA: Diagnosis not present

## 2016-02-24 DIAGNOSIS — I249 Acute ischemic heart disease, unspecified: Secondary | ICD-10-CM

## 2016-02-24 DIAGNOSIS — Z113 Encounter for screening for infections with a predominantly sexual mode of transmission: Secondary | ICD-10-CM

## 2016-02-24 DIAGNOSIS — B20 Human immunodeficiency virus [HIV] disease: Secondary | ICD-10-CM

## 2016-02-24 MED ORDER — ABACAVIR-DOLUTEGRAVIR-LAMIVUD 600-50-300 MG PO TABS: 1.0000 | ORAL_TABLET | Freq: Every day | ORAL | 11 refills | Status: DC

## 2016-02-24 NOTE — Progress Notes (Signed)
CC: Follow up for HIV  Interval history: Currently is asymptomatic and well-controlled on Triumeq.  Since last visit he denies any missed doses.  Has no associated n/v. He has been very compliant thoughhas not been seen in almost 2 years.      Also has Hodgkin's lymphoma and CAD.  Had a recent cath and severe CAD noted.  Follow's Dr. Alen Blew for oncology and Dr. Einar Gip for cardiology.  Has not been back to his PCP since he owes money.  Weight good, excercising some.  Last chemo was July 2015 then sepsis, MI and now just on oberservation every 6 months.    Prior to Admission medications   Medication Sig Start Date End Date Taking? Authorizing Provider  abacavir-dolutegravir-lamiVUDine (TRIUMEQ) 600-50-300 MG tablet Take 1 tablet by mouth daily. 02/24/16  Yes [name], MD  atorvastatin (LIPITOR) 80 MG tablet Take 1 tablet (80 mg total) by mouth daily. 10/29/15  Yes Neldon Labella, NP  doxycycline (VIBRAMYCIN) 100 MG capsule Take 1 capsule (100 mg total) by mouth 2 (two) times daily. 02/17/16  Yes Shary Decamp, PA-C  ferrous sulfate 325 (65 FE) MG tablet TAKE 1 TABLET (325 MG TOTAL) BY MOUTH TWO   (TWO) TIMES DAILY WITH A MEAL. 11/17/14  Yes [name], MD  isosorbide mononitrate (IMDUR) 30 MG 24 hr tablet Take 1 tablet (30 mg total) by mouth daily. 10/29/15  Yes Neldon Labella, NP  metoprolol tartrate (LOPRESSOR) 25 MG tablet Take 1 tablet (25 mg total) by mouth 2 (two) times daily. 05/21/14  Yes Annita Brod, MD  nitroGLYCERIN (NITROSTAT) 0.4 MG SL tablet Place 1 tablet (0.4 mg total) under the tongue every 5 (five) minutes x 3 doses as needed for chest pain. 10/29/15  Yes Neldon Labella, NP  ticagrelor (BRILINTA) 60 MG TABS tablet Take 1 tablet (60 mg total) by mouth 2 (two) times daily. 10/29/15  Yes Neldon Labella, NP  feeding supplement, ENSURE COMPLETE, (ENSURE COMPLETE) LIQD Take 237 mLs by mouth 3 (three) times daily between meals. Patient not taking: Reported on 02/24/2016 01/28/15    [name], MD  oxyCODONE (ROXICODONE) 5 MG immediate release tablet Take 1 tablet (5 mg total) by mouth every 6 (six) hours as needed for severe pain. Patient not taking: Reported on 02/24/2016 02/16/16   Shary Decamp, PA-C    Review of Systems Constitutional: negative for fevers, chills and malaise Cardiovascular: negative for chest pain Gastrointestinal: negative for nausea and diarrhea All other systems reviewed and are negative   Physical Exam: CONSTITUTIONAL:in no apparent distress and alert  Vitals:   02/24/16 1053  BP: 108/72  Pulse: 72  Temp: 98 F (36.7 C)   Eyes: anicteric HENT: no thrush, no cervical lymphadenopathy Respiratory: Normal respiratory effort; CTA B GI; soft, nt  Lab Results  Component Value Date   HIV1RNAQUANT <20 12/30/2015   HIV1RNAQUANT <20 01/05/2015   HIV1RNAQUANT <20 06/24/2014   No components found for: HIV1GENOTYPRPLUS No components found for: THELPERCELL  SHx: no tobacco

## 2016-02-24 NOTE — Assessment & Plan Note (Signed)
Doing well on Triumeq and will continue.  RTC 6 months.

## 2016-02-24 NOTE — Assessment & Plan Note (Signed)
Continues to follow oncology

## 2016-02-24 NOTE — Assessment & Plan Note (Signed)
Stable creat and no dose adjustment needed.

## 2016-04-11 ENCOUNTER — Other Ambulatory Visit: Payer: Medicare Other

## 2016-04-11 ENCOUNTER — Ambulatory Visit: Payer: Medicare Other | Admitting: Oncology

## 2016-07-21 ENCOUNTER — Other Ambulatory Visit: Payer: Self-pay | Admitting: Nurse Practitioner

## 2016-08-25 ENCOUNTER — Other Ambulatory Visit: Payer: Medicare Other

## 2016-09-05 ENCOUNTER — Ambulatory Visit: Payer: Medicare Other | Admitting: Internal Medicine

## 2017-01-22 ENCOUNTER — Other Ambulatory Visit: Payer: Self-pay | Admitting: Internal Medicine

## 2017-02-20 ENCOUNTER — Other Ambulatory Visit: Payer: Medicare Other

## 2017-02-23 ENCOUNTER — Ambulatory Visit: Payer: Medicare Other | Admitting: Oncology

## 2017-02-23 ENCOUNTER — Other Ambulatory Visit: Payer: Self-pay | Admitting: Internal Medicine

## 2017-02-23 MED ORDER — ABACAVIR-DOLUTEGRAVIR-LAMIVUD 600-50-300 MG PO TABS: 1.0000 | ORAL_TABLET | Freq: Every day | ORAL | 3 refills | Status: DC

## 2017-02-26 ENCOUNTER — Telehealth: Payer: Self-pay | Admitting: *Deleted

## 2017-02-26 NOTE — Telephone Encounter (Signed)
-----   Message from Carlyle Basques, MD sent at 02/23/2017  4:36 PM EDT ----- Can you give this pt a call to set up appt with comer. [phone]. I refilled his triumeq x 44months. b20 follow up not a sick visit

## 2017-02-26 NOTE — Telephone Encounter (Signed)
Called patient and he is already on the schedule for 03/06/17. He is aware of this appointment. Myrtis Hopping

## 2017-02-28 ENCOUNTER — Telehealth: Payer: Self-pay | Admitting: Oncology

## 2017-02-28 DIAGNOSIS — I2510 Atherosclerotic heart disease of native coronary artery without angina pectoris: Secondary | ICD-10-CM | POA: Diagnosis not present

## 2017-02-28 DIAGNOSIS — Z8571 Personal history of Hodgkin lymphoma: Secondary | ICD-10-CM | POA: Diagnosis not present

## 2017-02-28 DIAGNOSIS — N183 Chronic kidney disease, stage 3 (moderate): Secondary | ICD-10-CM | POA: Diagnosis not present

## 2017-02-28 DIAGNOSIS — R69 Illness, unspecified: Secondary | ICD-10-CM | POA: Diagnosis not present

## 2017-02-28 NOTE — Telephone Encounter (Signed)
Rescheduled his appointment and gave him avs and updated calendar.

## 2017-03-06 ENCOUNTER — Telehealth: Payer: Self-pay | Admitting: Oncology

## 2017-03-06 ENCOUNTER — Ambulatory Visit (HOSPITAL_BASED_OUTPATIENT_CLINIC_OR_DEPARTMENT_OTHER): Payer: Medicare HMO | Admitting: Oncology

## 2017-03-06 ENCOUNTER — Ambulatory Visit (INDEPENDENT_AMBULATORY_CARE_PROVIDER_SITE_OTHER): Payer: Medicare HMO | Admitting: Internal Medicine

## 2017-03-06 ENCOUNTER — Encounter: Payer: Self-pay | Admitting: Internal Medicine

## 2017-03-06 VITALS — BP 125/87 | HR 70 | Temp 98.2°F | Ht 72.0 in | Wt 190.0 lb

## 2017-03-06 VITALS — BP 108/66 | HR 70 | Temp 98.0°F | Resp 17 | Ht 72.0 in | Wt 190.7 lb

## 2017-03-06 DIAGNOSIS — Z113 Encounter for screening for infections with a predominantly sexual mode of transmission: Secondary | ICD-10-CM | POA: Diagnosis not present

## 2017-03-06 DIAGNOSIS — I2510 Atherosclerotic heart disease of native coronary artery without angina pectoris: Secondary | ICD-10-CM | POA: Diagnosis not present

## 2017-03-06 DIAGNOSIS — B20 Human immunodeficiency virus [HIV] disease: Secondary | ICD-10-CM

## 2017-03-06 DIAGNOSIS — C8100 Nodular lymphocyte predominant Hodgkin lymphoma, unspecified site: Secondary | ICD-10-CM

## 2017-03-06 DIAGNOSIS — Z8571 Personal history of Hodgkin lymphoma: Secondary | ICD-10-CM

## 2017-03-06 DIAGNOSIS — N289 Disorder of kidney and ureter, unspecified: Secondary | ICD-10-CM

## 2017-03-06 DIAGNOSIS — Z5181 Encounter for therapeutic drug level monitoring: Secondary | ICD-10-CM

## 2017-03-06 DIAGNOSIS — R69 Illness, unspecified: Secondary | ICD-10-CM | POA: Diagnosis not present

## 2017-03-06 DIAGNOSIS — E782 Mixed hyperlipidemia: Secondary | ICD-10-CM | POA: Diagnosis not present

## 2017-03-06 LAB — CBC WITH DIFFERENTIAL/PLATELET
Basophils Absolute: 40 {cells}/uL (ref 0–200)
Basophils Relative: 1 %
Eosinophils Absolute: 40 {cells}/uL (ref 15–500)
Eosinophils Relative: 1 %
HCT: 43.3 % (ref 38.5–50.0)
Hemoglobin: 14.8 g/dL (ref 13.2–17.1)
Lymphocytes Relative: 39 %
Lymphs Abs: 1560 {cells}/uL (ref 850–3900)
MCH: 35.6 pg — ABNORMAL HIGH (ref 27.0–33.0)
MCHC: 34.2 g/dL (ref 32.0–36.0)
MCV: 104.1 fL — ABNORMAL HIGH (ref 80.0–100.0)
MPV: 10.7 fL (ref 7.5–12.5)
Monocytes Absolute: 360 {cells}/uL (ref 200–950)
Monocytes Relative: 9 %
Neutro Abs: 2000 {cells}/uL (ref 1500–7800)
Neutrophils Relative %: 50 %
Platelets: 184 K/uL (ref 140–400)
RBC: 4.16 MIL/uL — ABNORMAL LOW (ref 4.20–5.80)
RDW: 13.8 % (ref 11.0–15.0)
WBC: 4 K/uL (ref 3.8–10.8)

## 2017-03-06 NOTE — Telephone Encounter (Signed)
Scheduled appt per 8/14 los - Gave patient AVS and calender per los.  

## 2017-03-06 NOTE — Assessment & Plan Note (Signed)
Doing well by his report and will check his labs today to confirm.  Can rtc in 6 months if good.

## 2017-03-06 NOTE — Assessment & Plan Note (Signed)
Will check creat, lfts.

## 2017-03-06 NOTE — Assessment & Plan Note (Signed)
Will screen today 

## 2017-03-06 NOTE — Progress Notes (Signed)
   Subjective:    Patient ID: Thomas [name], male    DOB: Feb 10, 1981, 36 y.o.   MRN: [id number]  HPI Here for follow up of HIV Here after a long abscence of over 1 year.  Has been on Triumeq and taking daily with no missed doses.  Doing well and did not ever run out.  Has recently seen his cardiologist Dr. Einar Gip.  Gets his lipid panel done there.  Going to see Dr. Alen Blew today as well.  No associated n/v/d. Has had some weight gain due to diet.  No other new issues.     Review of Systems  Constitutional: Negative for fatigue and fever.  Gastrointestinal: Negative for diarrhea and nausea.  Skin: Negative for rash.  Neurological: Negative for dizziness.       Objective:   Physical Exam  Constitutional: He appears well-developed and well-nourished. No distress.  HENT:  Mouth/Throat: No oropharyngeal exudate.  Eyes: No scleral icterus.  Cardiovascular: Normal rate, regular rhythm and normal heart sounds.   No murmur heard. Pulmonary/Chest: Effort normal and breath sounds normal. No respiratory distress.  Lymphadenopathy:    He has no cervical adenopathy.  Skin: No rash noted.   SH: sexually active with condoms       Assessment & Plan:

## 2017-03-06 NOTE — Assessment & Plan Note (Signed)
Followed by cardiology 

## 2017-03-06 NOTE — Progress Notes (Signed)
Hematology and Oncology Follow Up Visit  Thomas [name] [id number] 05/17/1981 36 y.o. 03/06/2017 1:08 PM   Principle Diagnosis:  36 year old HIV positive gentleman diagnosed with Hodgkin's disease in January of 2015 after he presented with anemia and diffuse lymphadenopathy. Stage IIIA disease.    Prior Therapy:  He is status post supraclavicular right lymph node biopsy done on 08/02/2013. Systemic chemotherapy in the form of ABVD started on 10/03/2013. He S/P 5 cycles last of which given in July 2015. Therapy discontinued due to sepsis, kidney surgery and myocardial infarction. Follow-up PET scan in September 2015 and April 2016 showed no evidence of recurrent disease.  Current therapy: Observation and surveillance.  Interim History:  Thomas [name] presents today for a followup visit with his mother. Since his last visit, he reports doing reasonably well without any issues. His appetite has been excellent and have gained close to 30 pounds since the last visit. He reports no illnesses or hospitalizations. He has not reported any lymphadenopathy or constitutional symptoms. He has not reported any chest pain or difficulty breathing. He denied any fevers or chills. His performance status and activity level remain excellent.    He does not report any headaches, blurry vision, syncope or seizures. He denies any nausea, vomiting or abdominal pain. He does not report any chest pain or shortness of breath. He denies fevers or chills or sweats. Denied pruritus. Denied hematochezia or melena or any bleeding problems. He does not report any abdominal distention or early satiety. As that report any ascites. Does not report any musculoskeletal complaints. Does not report any arthralgias or myalgias or skin rashes or lymphadenopathy. Is not reporting any petechiae rashes or bleeding. Rest of his review of system is unremarkable.  Medications: I have reviewed the patient's current medications. Unchanged by my  review today Current Outpatient Prescriptions  Medication Sig Dispense Refill  . abacavir-dolutegravir-lamiVUDine (TRIUMEQ) 600-50-300 MG tablet Take 1 tablet by mouth daily. 30 tablet 3  . atorvastatin (LIPITOR) 80 MG tablet Take 1 tablet (80 mg total) by mouth daily. 30 tablet 1  . ferrous sulfate 325 (65 FE) MG tablet TAKE 1 TABLET (325 MG TOTAL) BY MOUTH TWO   (TWO) TIMES DAILY WITH A MEAL. 60 tablet 0  . isosorbide mononitrate (IMDUR) 30 MG 24 hr tablet Take 1 tablet (30 mg total) by mouth daily. 30 tablet 1  . metoprolol tartrate (LOPRESSOR) 25 MG tablet Take 1 tablet (25 mg total) by mouth 2 (two) times daily. 60 tablet 1  . nitroGLYCERIN (NITROSTAT) 0.4 MG SL tablet Place 1 tablet (0.4 mg total) under the tongue every 5 (five) minutes x 3 doses as needed for chest pain. (Patient not taking: Reported on 03/06/2017) 25 tablet 1  . ticagrelor (BRILINTA) 60 MG TABS tablet Take 1 tablet (60 mg total) by mouth 2 (two) times daily. 60 tablet 1   No current facility-administered medications for this visit.      Allergies:  Allergies  Allergen Reactions  . Tylenol [Acetaminophen] Other (See Comments)    sweats    Past Medical History, Surgical history, Social history, and Family History were reviewed and updated.    Physical Exam: Blood pressure 108/66, pulse 70, temperature 98 F (36.7 C), temperature source Oral, resp. rate 17, height 6' (1.829 m), weight 190 lb 11.2 oz (86.5 kg), SpO2 100 %.  ECOG: 0  General appearance: Well-appearing gentleman without distress. Head: Normocephalic, without obvious abnormality no oral thrush or ulcers. Neck: no carotid bruit no thyroid masses or  lymphadenopathy. Lymph nodes: No lymphadenopathy noted.  Heart:regular rate and rhythm, S1, S2 normal, no murmur, click, rub or gallop Lung:chest clear, no wheezing, rales, normal symmetric air entry Abdomin: soft, non-tender, without masses or organomegaly EXT:no erythema, induration, or  nodules Skin: No rashes or lesions. Neurological exam: No motor or since deficits.    Lab Results: Lab Results  Component Value Date   WBC 4.6 12/30/2015   HGB 13.3 12/30/2015   HCT 39.3 12/30/2015   MCV 102.6 (H) 12/30/2015   PLT 171 12/30/2015     Chemistry      Component Value Date/Time   NA 142 12/30/2015 1642   NA 141 10/07/2015 1442   K 4.3 12/30/2015 1642   K 4.2 10/07/2015 1442   CL 108 12/30/2015 1642   CO2 23 12/30/2015 1642   CO2 23 10/07/2015 1442   BUN 20 12/30/2015 1642   BUN 20.8 10/07/2015 1442   CREATININE 1.96 (H) 12/30/2015 1642   CREATININE 2.3 (H) 10/07/2015 1442      Component Value Date/Time   CALCIUM 9.4 12/30/2015 1642   CALCIUM 9.0 10/07/2015 1442   ALKPHOS 67 12/30/2015 1642   ALKPHOS 91 10/07/2015 1442   AST 13 12/30/2015 1642   AST 16 10/07/2015 1442   ALT 17 12/30/2015 1642   ALT 17 10/07/2015 1442   BILITOT 0.3 12/30/2015 1642   BILITOT 0.34 10/07/2015 1237      36 year old gentleman with the following issues:  1. Hodgkin's disease presented with lymphadenopathy above and below the diaphragm as well as bone marrow involvement without any constitutional symptoms indicating at least stage IIIA. He is status post 5 cycles of ABVD chemotherapy interrupted due to acute illness outlined above. That included a sepsis, kidney operation and cardiac arrest.   PET CT scan on 11/06/2014  showed no evidence of recurrent disease.   He continues to be on active surveillance at this time. His physical examination and laboratory data do not support a diagnosis of recurrent disease.   The plan is to continue with active surveillance every 6 months and repeat imaging studies as needed.   2. Anemia: Likely related to Hodgkin's disease. His hemoglobin has normalized.  3. HIV: He continues to follow with Dr. Linus Salmons regarding that time.  4. Renal insufficiency: His creatinine is stable at baseline of close to 2.  5. Coronary artery disease: The  status post myocardial infarction. Continues to follow with Dr. Einar Gip.  6. Followup: Will be in 6 months sooner if needed to.   [facility], MD 8/14/20181:08 PM

## 2017-03-07 LAB — COMPLETE METABOLIC PANEL WITH GFR
ALT: 10 U/L (ref 9–46)
AST: 15 U/L (ref 10–40)
Albumin: 4.3 g/dL (ref 3.6–5.1)
Alkaline Phosphatase: 55 U/L (ref 40–115)
BUN: 25 mg/dL (ref 7–25)
CHLORIDE: 106 mmol/L (ref 98–110)
CO2: 19 mmol/L — ABNORMAL LOW (ref 20–32)
Calcium: 9.3 mg/dL (ref 8.6–10.3)
Creat: 2.02 mg/dL — ABNORMAL HIGH (ref 0.60–1.35)
GFR, Est African American: 49 mL/min — ABNORMAL LOW (ref >=60)
GFR, Est Non African American: 42 mL/min — ABNORMAL LOW (ref >=60)
GLUCOSE: 80 mg/dL (ref 65–99)
POTASSIUM: 5 mmol/L (ref 3.5–5.3)
SODIUM: 141 mmol/L (ref 135–146)
Total Bilirubin: 0.3 mg/dL (ref 0.2–1.2)
Total Protein: 7.2 g/dL (ref 6.1–8.1)

## 2017-03-07 LAB — T-HELPER CELL (CD4) - (RCID CLINIC ONLY)
CD4 T CELL HELPER: 34 % (ref 33–55)
CD4 T Cell Abs: 520 /uL (ref 400–2700)

## 2017-03-07 LAB — RPR

## 2017-03-08 LAB — HIV-1 RNA QUANT-NO REFLEX-BLD
HIV 1 RNA QUANT: NOT DETECTED {copies}/mL
HIV-1 RNA QUANT, LOG: NOT DETECTED {Log_copies}/mL

## 2017-06-07 ENCOUNTER — Other Ambulatory Visit: Payer: Self-pay | Admitting: Internal Medicine

## 2017-07-06 DIAGNOSIS — I252 Old myocardial infarction: Secondary | ICD-10-CM | POA: Diagnosis not present

## 2017-07-06 DIAGNOSIS — Z6824 Body mass index (BMI) 24.0-24.9, adult: Secondary | ICD-10-CM | POA: Diagnosis not present

## 2017-07-06 DIAGNOSIS — I2510 Atherosclerotic heart disease of native coronary artery without angina pectoris: Secondary | ICD-10-CM | POA: Diagnosis not present

## 2017-07-06 DIAGNOSIS — Z0000 Encounter for general adult medical examination without abnormal findings: Secondary | ICD-10-CM | POA: Diagnosis not present

## 2017-07-06 DIAGNOSIS — E785 Hyperlipidemia, unspecified: Secondary | ICD-10-CM | POA: Diagnosis not present

## 2017-07-06 DIAGNOSIS — Z9221 Personal history of antineoplastic chemotherapy: Secondary | ICD-10-CM | POA: Diagnosis not present

## 2017-07-06 DIAGNOSIS — Z7902 Long term (current) use of antithrombotics/antiplatelets: Secondary | ICD-10-CM | POA: Diagnosis not present

## 2017-07-06 DIAGNOSIS — Z923 Personal history of irradiation: Secondary | ICD-10-CM | POA: Diagnosis not present

## 2017-07-06 DIAGNOSIS — Z8572 Personal history of non-Hodgkin lymphomas: Secondary | ICD-10-CM | POA: Diagnosis not present

## 2017-07-06 DIAGNOSIS — R69 Illness, unspecified: Secondary | ICD-10-CM | POA: Diagnosis not present

## 2017-08-23 ENCOUNTER — Other Ambulatory Visit: Payer: Medicare Other

## 2017-08-23 ENCOUNTER — Other Ambulatory Visit: Payer: Self-pay | Admitting: *Deleted

## 2017-08-23 DIAGNOSIS — B20 Human immunodeficiency virus [HIV] disease: Secondary | ICD-10-CM

## 2017-09-06 ENCOUNTER — Ambulatory Visit (INDEPENDENT_AMBULATORY_CARE_PROVIDER_SITE_OTHER): Payer: Medicare HMO | Admitting: Internal Medicine

## 2017-09-06 ENCOUNTER — Ambulatory Visit: Payer: Medicare Other | Admitting: Oncology

## 2017-09-06 ENCOUNTER — Encounter: Payer: Self-pay | Admitting: Internal Medicine

## 2017-09-06 ENCOUNTER — Other Ambulatory Visit (HOSPITAL_COMMUNITY)
Admission: RE | Admit: 2017-09-06 | Discharge: 2017-09-06 | Disposition: A | Discharge status: Home / Self Care | Payer: Medicare HMO | Source: Ambulatory Visit | Attending: Internal Medicine | Admitting: Internal Medicine

## 2017-09-06 VITALS — BP 137/89 | HR 81 | Temp 97.8°F | Wt 200.0 lb

## 2017-09-06 DIAGNOSIS — B20 Human immunodeficiency virus [HIV] disease: Secondary | ICD-10-CM | POA: Insufficient documentation

## 2017-09-06 DIAGNOSIS — Z23 Encounter for immunization: Secondary | ICD-10-CM | POA: Diagnosis not present

## 2017-09-06 DIAGNOSIS — Z7189 Other specified counseling: Secondary | ICD-10-CM | POA: Diagnosis not present

## 2017-09-06 DIAGNOSIS — Z7185 Encounter for immunization safety counseling: Secondary | ICD-10-CM

## 2017-09-06 DIAGNOSIS — Z113 Encounter for screening for infections with a predominantly sexual mode of transmission: Secondary | ICD-10-CM

## 2017-09-06 DIAGNOSIS — R69 Illness, unspecified: Secondary | ICD-10-CM | POA: Diagnosis not present

## 2017-09-06 DIAGNOSIS — N183 Chronic kidney disease, stage 3 unspecified: Secondary | ICD-10-CM

## 2017-09-06 NOTE — Assessment & Plan Note (Signed)
Will screen today 

## 2017-09-06 NOTE — Assessment & Plan Note (Signed)
Doing well.  Continue ARVs and rtc 6 months.

## 2017-09-06 NOTE — Assessment & Plan Note (Signed)
Flu shot offered and given today 

## 2017-09-06 NOTE — Progress Notes (Signed)
   Subjective:    Patient ID: Thomas [name], male    DOB: 01/10/1981, 37 y.o.   MRN: [id number]  HPI Here for follow up of HIV Has been on Triumeq and denies any missed doses.  No labs prior to this visit.  Good weight gain.  Continues to follow with Dr. Einar Gip and Dr. Alen Blew.  No associated n/v/d.     Review of Systems  Constitutional: Negative for fatigue.  Gastrointestinal: Negative for diarrhea.  Skin: Negative for rash.       Objective:   Physical Exam  Constitutional: He appears well-developed and well-nourished. No distress.  HENT:  Mouth/Throat: No oropharyngeal exudate.  Eyes: No scleral icterus.  Cardiovascular: Normal rate, regular rhythm and normal heart sounds.  No murmur heard. Pulmonary/Chest: Breath sounds normal. No respiratory distress.  Lymphadenopathy:    He has no cervical adenopathy.  Skin: No rash noted.   SH: former smoker       Assessment & Plan:

## 2017-09-07 LAB — URINE CYTOLOGY ANCILLARY ONLY
CHLAMYDIA, DNA PROBE: NEGATIVE
NEISSERIA GONORRHEA: NEGATIVE

## 2017-09-07 LAB — CBC WITH DIFFERENTIAL/PLATELET
BASOS PCT: 0.3 %
Basophils Absolute: 10 cells/uL (ref 0–200)
EOS ABS: 48 {cells}/uL (ref 15–500)
Eosinophils Relative: 1.5 %
HCT: 41.1 % (ref 38.5–50.0)
Hemoglobin: 14.6 g/dL (ref 13.2–17.1)
Lymphs Abs: 1309 cells/uL (ref 850–3900)
MCH: 35.1 pg — AB (ref 27.0–33.0)
MCHC: 35.5 g/dL (ref 32.0–36.0)
MCV: 98.8 fL (ref 80.0–100.0)
MONOS PCT: 8.4 %
MPV: 10.9 fL (ref 7.5–12.5)
NEUTROS PCT: 48.9 %
Neutro Abs: 1565 cells/uL (ref 1500–7800)
PLATELETS: 180 10*3/uL (ref 140–400)
RBC: 4.16 10*6/uL — AB (ref 4.20–5.80)
RDW: 12.2 % (ref 11.0–15.0)
Total Lymphocyte: 40.9 %
WBC mixed population: 269 cells/uL (ref 200–950)
WBC: 3.2 10*3/uL — AB (ref 3.8–10.8)

## 2017-09-07 LAB — COMPLETE METABOLIC PANEL WITH GFR
AG RATIO: 1.6 (calc) (ref 1.0–2.5)
ALT: 16 U/L (ref 9–46)
AST: 17 U/L (ref 10–40)
Albumin: 4.4 g/dL (ref 3.6–5.1)
Alkaline phosphatase (APISO): 56 U/L (ref 40–115)
BUN/Creatinine Ratio: 9 (calc) (ref 6–22)
BUN: 20 mg/dL (ref 7–25)
CALCIUM: 9.4 mg/dL (ref 8.6–10.3)
CHLORIDE: 107 mmol/L (ref 98–110)
CO2: 26 mmol/L (ref 20–32)
Creat: 2.23 mg/dL — ABNORMAL HIGH (ref 0.60–1.35)
GFR, EST NON AFRICAN AMERICAN: 37 mL/min/{1.73_m2} — AB (ref >=60)
GFR, Est African American: 43 mL/min/{1.73_m2} — ABNORMAL LOW (ref >=60)
GLOBULIN: 2.7 g/dL (ref 1.9–3.7)
Glucose, Bld: 84 mg/dL (ref 65–99)
Potassium: 4.5 mmol/L (ref 3.5–5.3)
SODIUM: 141 mmol/L (ref 135–146)
Total Bilirubin: 0.3 mg/dL (ref 0.2–1.2)
Total Protein: 7.1 g/dL (ref 6.1–8.1)

## 2017-09-07 LAB — T-HELPER CELL (CD4) - (RCID CLINIC ONLY)
CD4 T CELL ABS: 520 /uL (ref 400–2700)
CD4 T CELL HELPER: 37 % (ref 33–55)

## 2017-09-07 LAB — RPR: RPR Ser Ql: NONREACTIVE

## 2017-09-08 LAB — HIV-1 RNA QUANT-NO REFLEX-BLD
HIV 1 RNA QUANT: NOT DETECTED {copies}/mL
HIV-1 RNA Quant, Log: <1.3 Log copies/mL

## 2017-09-13 ENCOUNTER — Telehealth: Payer: Self-pay | Admitting: Oncology

## 2017-09-13 ENCOUNTER — Inpatient Hospital Stay: Payer: Medicare HMO | Attending: Oncology | Admitting: Oncology

## 2017-09-13 VITALS — BP 143/86 | HR 80 | Temp 98.1°F | Resp 20 | Ht 72.0 in | Wt 204.0 lb

## 2017-09-13 DIAGNOSIS — I2510 Atherosclerotic heart disease of native coronary artery without angina pectoris: Secondary | ICD-10-CM | POA: Diagnosis not present

## 2017-09-13 DIAGNOSIS — I252 Old myocardial infarction: Secondary | ICD-10-CM | POA: Diagnosis not present

## 2017-09-13 DIAGNOSIS — N289 Disorder of kidney and ureter, unspecified: Secondary | ICD-10-CM | POA: Diagnosis not present

## 2017-09-13 DIAGNOSIS — R69 Illness, unspecified: Secondary | ICD-10-CM | POA: Diagnosis not present

## 2017-09-13 DIAGNOSIS — B20 Human immunodeficiency virus [HIV] disease: Secondary | ICD-10-CM | POA: Insufficient documentation

## 2017-09-13 DIAGNOSIS — Z8571 Personal history of Hodgkin lymphoma: Secondary | ICD-10-CM | POA: Diagnosis not present

## 2017-09-13 DIAGNOSIS — C8100 Nodular lymphocyte predominant Hodgkin lymphoma, unspecified site: Secondary | ICD-10-CM

## 2017-09-13 NOTE — Progress Notes (Signed)
Hematology and Oncology Follow Up Visit  Thomas [name] [id number] 03-22-1981 37 y.o. 09/13/2017 10:31 AM   Principle Diagnosis:  37 year old man with:  1. Hodgkin's disease in January of 2015 after he presented with anemia and diffuse lymphadenopathy. Stage IIIA disease.  2. HIV followed by Dr. Linus Salmons with disease under control.  Prior Therapy:   ABVD chemotherapy started on 10/03/2013. He S/P 5 cycles last of which given in July 2015. Therapy discontinued due to sepsis, kidney surgery and myocardial infarction. Follow-up PET scan in September 2015 and April 2016 showed complete response to therapy.  Current therapy: Observation and surveillance.  Interim History:  Thomas [name] is here with his mother for a follow-up.  He reports no changes in his health and continues to be well since the last visit.  He denies any recurrent lymphadenopathy or petechiae.  He denies any weight loss or appetite changes.  He is eating well and continues to gain weight.  He has reported no major hospitalizations or illnesses.  He does report periodic chest pain with exertion and does take nitroglycerin with strenuous exercise.   He does not report any headaches, blurry vision, syncope or seizures.  He does not report any headaches, blurry vision, syncope or seizures.  He denies fevers or chills or sweats. Denied pruritus. He does not report any abdominal distention or early satiety. Does not report any arthralgias or myalgias or skin rashes or lymphadenopathy. Is not reporting any petechiae rashes or bleeding.  Report any anxiety or depression.  Remaining review of system is negative.  Medications: I have reviewed the patient's current medications. Unchanged by my review today Current Outpatient Medications  Medication Sig Dispense Refill  . atorvastatin (LIPITOR) 80 MG tablet Take 1 tablet (80 mg total) by mouth daily. 30 tablet 1  . ferrous sulfate 325 (65 FE) MG tablet TAKE 1 TABLET (325 MG TOTAL) BY MOUTH TWO    (TWO) TIMES DAILY WITH A MEAL. 60 tablet 0  . isosorbide mononitrate (IMDUR) 30 MG 24 hr tablet Take 1 tablet (30 mg total) by mouth daily. 30 tablet 1  . metoprolol tartrate (LOPRESSOR) 25 MG tablet Take 1 tablet (25 mg total) by mouth 2 (two) times daily. 60 tablet 1  . nitroGLYCERIN (NITROSTAT) 0.4 MG SL tablet Place 1 tablet (0.4 mg total) under the tongue every 5 (five) minutes x 3 doses as needed for chest pain. 25 tablet 1  . ticagrelor (BRILINTA) 60 MG TABS tablet Take 1 tablet (60 mg total) by mouth 2 (two) times daily. 60 tablet 1  . TRIUMEQ 600-50-300 MG tablet TAKE 1 TABLET BY MOUTH DAILY. 30 tablet 4   No current facility-administered medications for this visit.      Allergies:  Allergies  Allergen Reactions  . Tylenol [Acetaminophen] Other (See Comments)    sweats    Past Medical History, Surgical history, Social history, and Family History were reviewed and updated.    Physical Exam: Blood pressure (!) 143/86, pulse 80, temperature 98.1 F (36.7 C), temperature source Oral, resp. rate 20, height 6' (1.829 m), weight 204 lb (92.5 kg), SpO2 100 %.  ECOG: 0  General appearance: Alert, awake woman without distress. Head: Normocephalic, without obvious abnormality  Oropharynx: No oral thrush or ulcers. Neck: no carotid bruit no thyroid masses or lymphadenopathy. Lymph nodes: No lymphadenopathy noted in the cervical, supraclavicular or abdominal adenopathy. Heart:regular rate and rhythm, S1, S2 normal, no murmur, click, rub or gallop Lung: Clear without any rhonchi wheezes or dullness  to percussion. Abdomin: soft, non-tender, without masses or organomegaly without rebound or guarding. Musculoskeletal: No joint deformity or effusion. Skin: No rashes or lesions. Neurological exam: No deficits noted.   Lab Results: Lab Results  Component Value Date   WBC 3.2 (L) 09/06/2017   HGB 14.6 09/06/2017   HCT 41.1 09/06/2017   MCV 98.8 09/06/2017   PLT 180 09/06/2017      Chemistry      Component Value Date/Time   NA 141 09/06/2017 0939   NA 141 10/07/2015 1442   K 4.5 09/06/2017 0939   K 4.2 10/07/2015 1442   CL 107 09/06/2017 0939   CO2 26 09/06/2017 0939   CO2 23 10/07/2015 1442   BUN 20 09/06/2017 0939   BUN 20.8 10/07/2015 1442   CREATININE 2.23 (H) 09/06/2017 0939   CREATININE 2.3 (H) 10/07/2015 1442      Component Value Date/Time   CALCIUM 9.4 09/06/2017 0939   CALCIUM 9.0 10/07/2015 1442   ALKPHOS 55 03/06/2017 1133   ALKPHOS 91 10/07/2015 1442   AST 17 09/06/2017 0939   AST 16 10/07/2015 1442   ALT 16 09/06/2017 0939   ALT 17 10/07/2015 1442   BILITOT 0.3 09/06/2017 0939   BILITOT 0.34 10/07/2015 5560      37 year old gentleman with the following issues:  1.  Stage IIIA Hodgkin's disease diagnosed in 2015.  He is status post 5 cycles of ABVD chemotherapy and achieved CR.  The sixth cycle was not given because of cardiac complications.  Follow-up PET scans on 2 separate occasions continue to show no evidence of disease recurrence.  He is currently on active surveillance without any evidence to suggest recurrent disease.  The natural course of this disease was discussed today with the patient and his mother.  At this time he has no evidence to suggest need for any treatment.  We will continue to monitor him clinically and repeat imaging studies as needed.   2. Anemia: Hemoglobin is back to normal range after treatment of his underlying malignancy.  3. HIV: He continues to follow with Dr. Linus Salmons continues to do well.  4. Renal insufficiency: Kidney function continues to be at baseline.  5. Coronary artery disease: The status post myocardial infarction. I urged him to follow-up with his cardiologist in the near future as he is reporting occasional exertional dyspnea.  6. Followup: Will be in 6 months.   15  minutes was spent with the patient face-to-face today.  More than 50% of time was dedicated to patient counseling,  education and coordination of his multifaceted care.    Zola Button, MD 2/21/201910:31 AM

## 2017-09-13 NOTE — Telephone Encounter (Signed)
Appointments scheduled AVS/Calendar printed per 2/21 los °

## 2017-11-03 ENCOUNTER — Other Ambulatory Visit: Payer: Self-pay | Admitting: Internal Medicine

## 2018-02-25 ENCOUNTER — Other Ambulatory Visit: Payer: Medicare HMO

## 2018-03-11 ENCOUNTER — Ambulatory Visit (INDEPENDENT_AMBULATORY_CARE_PROVIDER_SITE_OTHER): Payer: Medicare HMO | Admitting: Internal Medicine

## 2018-03-11 ENCOUNTER — Encounter: Payer: Self-pay | Admitting: Internal Medicine

## 2018-03-11 VITALS — BP 125/89 | HR 84 | Temp 97.7°F | Ht 72.0 in | Wt 204.8 lb

## 2018-03-11 DIAGNOSIS — N183 Chronic kidney disease, stage 3 unspecified: Secondary | ICD-10-CM

## 2018-03-11 DIAGNOSIS — Z7189 Other specified counseling: Secondary | ICD-10-CM

## 2018-03-11 DIAGNOSIS — B20 Human immunodeficiency virus [HIV] disease: Secondary | ICD-10-CM

## 2018-03-11 DIAGNOSIS — Z23 Encounter for immunization: Secondary | ICD-10-CM | POA: Diagnosis not present

## 2018-03-11 DIAGNOSIS — Z113 Encounter for screening for infections with a predominantly sexual mode of transmission: Secondary | ICD-10-CM | POA: Diagnosis not present

## 2018-03-11 DIAGNOSIS — R69 Illness, unspecified: Secondary | ICD-10-CM | POA: Diagnosis not present

## 2018-03-11 DIAGNOSIS — Z7185 Encounter for immunization safety counseling: Secondary | ICD-10-CM

## 2018-03-11 NOTE — Assessment & Plan Note (Signed)
Will recheck his creat and continue on Triumeq.   rtc 6 months unless concerns.

## 2018-03-11 NOTE — Assessment & Plan Note (Signed)
Will check his labs today. rtc 6 months, labs prior

## 2018-03-11 NOTE — Assessment & Plan Note (Signed)
Due for PCV 13 and given today

## 2018-03-11 NOTE — Progress Notes (Signed)
   Subjective:    Patient ID: Thomas [name], male    DOB: 1980/10/03, 37 y.o.   MRN: [id number]  HPI Here for follow up of HIV He has been on Triumeq and denies any missed doses.  Continues to do well and no new concerns.  No associated n/v unless he takes it without food.  Is here with his mother and she voices no concerns.  No weight loss.     Review of Systems  Constitutional: Negative for fatigue.  Gastrointestinal: Negative for diarrhea.  Skin: Negative for rash.       Objective:   Physical Exam  Constitutional: He appears well-developed and well-nourished. No distress.  HENT:  Mouth/Throat: No oropharyngeal exudate.  Eyes: No scleral icterus.  Cardiovascular: Normal rate and regular rhythm.  No murmur heard. Pulmonary/Chest: Effort normal and breath sounds normal. No respiratory distress.  Skin: No rash noted.   SH: former smoker       Assessment & Plan:

## 2018-03-12 LAB — T-HELPER CELL (CD4) - (RCID CLINIC ONLY)
CD4 T CELL HELPER: 32 % — AB (ref 33–55)
CD4 T Cell Abs: 550 /uL (ref 400–2700)

## 2018-03-13 ENCOUNTER — Inpatient Hospital Stay: Payer: Medicare HMO | Attending: Oncology

## 2018-03-13 ENCOUNTER — Telehealth: Payer: Self-pay

## 2018-03-13 ENCOUNTER — Inpatient Hospital Stay (HOSPITAL_BASED_OUTPATIENT_CLINIC_OR_DEPARTMENT_OTHER): Payer: Medicare HMO | Admitting: Oncology

## 2018-03-13 VITALS — BP 118/89 | HR 84 | Temp 98.0°F | Resp 18 | Ht 72.0 in | Wt 199.3 lb

## 2018-03-13 DIAGNOSIS — R69 Illness, unspecified: Secondary | ICD-10-CM | POA: Diagnosis not present

## 2018-03-13 DIAGNOSIS — B20 Human immunodeficiency virus [HIV] disease: Secondary | ICD-10-CM

## 2018-03-13 DIAGNOSIS — N289 Disorder of kidney and ureter, unspecified: Secondary | ICD-10-CM | POA: Insufficient documentation

## 2018-03-13 DIAGNOSIS — C8100 Nodular lymphocyte predominant Hodgkin lymphoma, unspecified site: Secondary | ICD-10-CM

## 2018-03-13 DIAGNOSIS — Z8571 Personal history of Hodgkin lymphoma: Secondary | ICD-10-CM

## 2018-03-13 DIAGNOSIS — I2510 Atherosclerotic heart disease of native coronary artery without angina pectoris: Secondary | ICD-10-CM

## 2018-03-13 DIAGNOSIS — Z113 Encounter for screening for infections with a predominantly sexual mode of transmission: Secondary | ICD-10-CM

## 2018-03-13 LAB — COMPLETE METABOLIC PANEL WITH GFR
AG Ratio: 1.4 (calc) (ref 1.0–2.5)
ALT: 14 U/L (ref 9–46)
AST: 15 U/L (ref 10–40)
Albumin: 4.5 g/dL (ref 3.6–5.1)
Alkaline phosphatase (APISO): 57 U/L (ref 40–115)
BUN/Creatinine Ratio: 12 (calc) (ref 6–22)
BUN: 23 mg/dL (ref 7–25)
CALCIUM: 9.5 mg/dL (ref 8.6–10.3)
CO2: 27 mmol/L (ref 20–32)
CREATININE: 2 mg/dL — AB (ref 0.60–1.35)
Chloride: 106 mmol/L (ref 98–110)
GFR, EST AFRICAN AMERICAN: 49 mL/min/{1.73_m2} — AB (ref >=60)
GFR, EST NON AFRICAN AMERICAN: 42 mL/min/{1.73_m2} — AB (ref >=60)
GLUCOSE: 95 mg/dL (ref 65–99)
Globulin: 3.3 g/dL (calc) (ref 1.9–3.7)
Potassium: 4.5 mmol/L (ref 3.5–5.3)
Sodium: 140 mmol/L (ref 135–146)
TOTAL PROTEIN: 7.8 g/dL (ref 6.1–8.1)
Total Bilirubin: 0.4 mg/dL (ref 0.2–1.2)

## 2018-03-13 LAB — CBC WITH DIFFERENTIAL (CANCER CENTER ONLY)
BASOS PCT: 1 %
Basophils Absolute: 0 10*3/uL (ref 0.0–0.1)
EOS ABS: 0 10*3/uL (ref 0.0–0.5)
Eosinophils Relative: 1 %
HCT: 47.1 % (ref 38.4–49.9)
Hemoglobin: 16 g/dL (ref 13.0–17.1)
LYMPHS ABS: 1.8 10*3/uL (ref 0.9–3.3)
Lymphocytes Relative: 35 %
MCH: 35.7 pg — ABNORMAL HIGH (ref 27.2–33.4)
MCHC: 33.9 g/dL (ref 32.0–36.0)
MCV: 105.5 fL — ABNORMAL HIGH (ref 79.3–98.0)
MONO ABS: 0.3 10*3/uL (ref 0.1–0.9)
MONOS PCT: 6 %
Neutro Abs: 3 10*3/uL (ref 1.5–6.5)
Neutrophils Relative %: 57 %
Platelet Count: 127 10*3/uL — ABNORMAL LOW (ref 140–400)
RBC: 4.47 MIL/uL (ref 4.20–5.82)
RDW: 13.8 % (ref 11.0–14.6)
WBC Count: 5.2 10*3/uL (ref 4.0–10.3)

## 2018-03-13 LAB — CMP (CANCER CENTER ONLY)
ALBUMIN: 3.9 g/dL (ref 3.5–5.0)
ALT: 16 U/L (ref 0–44)
ANION GAP: 8 (ref 5–15)
AST: 15 U/L (ref 15–41)
Alkaline Phosphatase: 61 U/L (ref 38–126)
BILIRUBIN TOTAL: 0.3 mg/dL (ref 0.3–1.2)
BUN: 18 mg/dL (ref 6–20)
CALCIUM: 9.2 mg/dL (ref 8.9–10.3)
CO2: 26 mmol/L (ref 22–32)
Chloride: 108 mmol/L (ref 98–111)
Creatinine: 1.93 mg/dL — ABNORMAL HIGH (ref 0.61–1.24)
GFR, EST AFRICAN AMERICAN: 51 mL/min — AB (ref >60)
GFR, Estimated: 44 mL/min — ABNORMAL LOW (ref >60)
GLUCOSE: 91 mg/dL (ref 70–99)
POTASSIUM: 3.9 mmol/L (ref 3.5–5.1)
SODIUM: 142 mmol/L (ref 135–145)
TOTAL PROTEIN: 7.8 g/dL (ref 6.5–8.1)

## 2018-03-13 LAB — HIV-1 RNA QUANT-NO REFLEX-BLD
HIV 1 RNA Quant: <20 copies/mL — AB
HIV-1 RNA QUANT, LOG: DETECTED {Log_copies}/mL — AB

## 2018-03-13 NOTE — Progress Notes (Signed)
Hematology and Oncology Follow Up Visit  Thomas [name] [id number] September 30, 1980 37 y.o. 03/13/2018 3:35 PM   Principle Diagnosis:  37 year old man with:  1.  Stage IIIA Hodgkin's disease diagnosed in January of 2015 after he presented with anemia and lymphadenopathy.    2. HIV followed by Dr. Linus Salmons with disease under control.  Prior Therapy:   ABVD chemotherapy started on 10/03/2013. He S/P 5 cycles last of which given in July 2015. Therapy discontinued due to sepsis, kidney surgery and myocardial infarction.  PET scan in September 2015 and April 2016 showed complete response to therapy.  Current therapy: Active surveillance.  Interim History:  Thomas [name] is here for a follow-up visit.  Since the last visit, he reports no major changes in his health.  He continues to be reasonably active and attends to activities of daily living.  He denies any lymphadenopathy, constitutional symptoms or weight loss.  He is eating well and maintaining excellent performance status and quality of life.  He continues to be adherent to his HIV medication at this time.   He does not report any headaches, blurry vision, syncope or seizures.  He denies any alteration in mental status or confusion. He denies fevers or chills or sweats. Denied pruritus. He does not report any nausea, vomiting or abdominal pain.  He denies any frequency urgency or hesitancy.  Does not report any bone pain or pathological fractures.  He denies any petechiae or lymphadenopathy.  He denies any bleeding or clotting tendencies.  He denies any mood changes.  Remaining review of system is negative.  Medications: I have reviewed the patient's current medications. Unchanged by my review today Current Outpatient Medications  Medication Sig Dispense Refill  . atorvastatin (LIPITOR) 80 MG tablet Take 1 tablet (80 mg total) by mouth daily. 30 tablet 1  . ferrous sulfate 325 (65 FE) MG tablet TAKE 1 TABLET (325 MG TOTAL) BY MOUTH TWO   (TWO) TIMES  DAILY WITH A MEAL. 60 tablet 0  . isosorbide mononitrate (IMDUR) 30 MG 24 hr tablet Take 1 tablet (30 mg total) by mouth daily. 30 tablet 1  . metoprolol tartrate (LOPRESSOR) 25 MG tablet Take 1 tablet (25 mg total) by mouth 2 (two) times daily. 60 tablet 1  . nitroGLYCERIN (NITROSTAT) 0.4 MG SL tablet Place 1 tablet (0.4 mg total) under the tongue every 5 (five) minutes x 3 doses as needed for chest pain. 25 tablet 1  . ticagrelor (BRILINTA) 60 MG TABS tablet Take 1 tablet (60 mg total) by mouth 2 (two) times daily. 60 tablet 1  . TRIUMEQ 600-50-300 MG tablet TAKE 1 TABLET BY MOUTH DAILY. 30 tablet 4   No current facility-administered medications for this visit.      Allergies:  Allergies  Allergen Reactions  . Tylenol [Acetaminophen] Other (See Comments)    sweats    Past Medical History, Surgical history, Social history, and Family History were reviewed and updated.    Physical Exam: Blood pressure 118/89, pulse 84, temperature 98 F (36.7 C), temperature source Oral, resp. rate 18, height 6' (1.829 m), weight 199 lb 4.8 oz (90.4 kg), SpO2 100 %.  ECOG: 0  General appearance: Well-appearing gentleman without distress. Head: Atraumatic without normalities. Oropharynx: Mucous membranes are moist and pink. Eyes: No scleral icterus. Lymph nodes: No cervical, supraclavicular or inguinal adenopathy. Heart:regular rate and rhythm, S1, S2 without leg edema. Lung: Clear without any rhonchi, wheezes or dullness to percussion. Abdomin: Soft, nontender without any rebound or  guarding.  No shifting dullness or ascites. Musculoskeletal: No clubbing or cyanosis. Skin: No ecchymosis or petechiae. Neurological exam: No motor or sensory deficits.   Lab Results: Lab Results  Component Value Date   WBC 5.2 03/13/2018   HGB 16.0 03/13/2018   HCT 47.1 03/13/2018   MCV 105.5 (H) 03/13/2018   PLT 127 (L) 03/13/2018     Chemistry      Component Value Date/Time   NA 140 03/11/2018 1429    NA 141 10/07/2015 1442   K 4.5 03/11/2018 1429   K 4.2 10/07/2015 1442   CL 106 03/11/2018 1429   CO2 27 03/11/2018 1429   CO2 23 10/07/2015 1442   BUN 23 03/11/2018 1429   BUN 20.8 10/07/2015 1442   CREATININE 2.00 (H) 03/11/2018 1429   CREATININE 2.3 (H) 10/07/2015 1442      Component Value Date/Time   CALCIUM 9.5 03/11/2018 1429   CALCIUM 9.0 10/07/2015 1442   ALKPHOS 55 03/06/2017 1133   ALKPHOS 91 10/07/2015 1442   AST 15 03/11/2018 1429   AST 16 10/07/2015 1442   ALT 14 03/11/2018 1429   ALT 17 10/07/2015 1442   BILITOT 0.4 03/11/2018 1429   BILITOT 0.34 10/07/2015 3448      37 year old man with:  1.  Stage IIIA Hodgkin's disease presented with lymphadenopathy and anemia in 2015.  He received ABVD chemotherapy and achieved complete response with the following imaging studies continues to show no evidence of disease recurrence.    His laboratory data as well as physical examination today do not suggest any evidence of relapse disease.  The natural course of this disease was reviewed again and risk of relapse was assessed.  His last PET scan a year after his therapy showed complete response.  For the time being we will continue to monitor him clinically until he completes about 5 years from the conclusion of therapy which would be in July 2020.   2. Anemia: Hemoglobin is back to normal at this time without any evidence of worsening anemia.  3. HIV: He continues to follow with Dr. Linus Salmons without any recent exacerbations.  4. Renal insufficiency: Related to kidney injury and has resulted in chronic renal insufficiency.  Creatinine remains at baseline at this time.  5. Coronary artery disease: The status post myocardial infarction which she has recovered from at this time.  6. Followup: Will be in 8 months.   15  minutes was spent with the patient face-to-face today.  More than 50% of time was dedicated to discussing the natural course of this disease, management options  and coordinating his plan of care.    Zola Button, MD 8/21/20193:35 PM

## 2018-03-13 NOTE — Telephone Encounter (Signed)
Printed avs and calender of upcoming appointment. Per 8/21 los 

## 2018-03-27 ENCOUNTER — Other Ambulatory Visit: Payer: Self-pay | Admitting: Internal Medicine

## 2018-06-17 ENCOUNTER — Other Ambulatory Visit: Payer: Self-pay | Admitting: Internal Medicine

## 2018-08-28 ENCOUNTER — Other Ambulatory Visit: Payer: Medicare HMO

## 2018-09-11 ENCOUNTER — Ambulatory Visit: Payer: Medicare HMO | Admitting: Internal Medicine

## 2018-09-28 ENCOUNTER — Other Ambulatory Visit: Payer: Self-pay | Admitting: Internal Medicine

## 2018-10-24 ENCOUNTER — Telehealth: Payer: Self-pay

## 2018-10-24 NOTE — Telephone Encounter (Signed)
Pt's mother called , stating pt has been having chest pains for several days and wanted her son to be seen

## 2018-10-24 NOTE — Telephone Encounter (Signed)
Happy to see if he passes screening otherwise virtual

## 2018-10-30 ENCOUNTER — Telehealth: Payer: Self-pay | Admitting: Oncology

## 2018-10-30 NOTE — Telephone Encounter (Signed)
Per 4/6 changed 4/22 appt to phone call - left message for patient that he will be called and does not have to come in.

## 2018-10-31 ENCOUNTER — Other Ambulatory Visit: Payer: Self-pay

## 2018-10-31 ENCOUNTER — Ambulatory Visit: Payer: Self-pay | Admitting: Cardiology

## 2018-10-31 ENCOUNTER — Encounter: Payer: Self-pay | Admitting: Cardiology

## 2018-10-31 ENCOUNTER — Ambulatory Visit: Payer: Medicare HMO | Admitting: Cardiology

## 2018-10-31 VITALS — Ht 73.0 in | Wt 210.0 lb

## 2018-10-31 DIAGNOSIS — N183 Chronic kidney disease, stage 3 unspecified: Secondary | ICD-10-CM

## 2018-10-31 DIAGNOSIS — E7800 Pure hypercholesterolemia, unspecified: Secondary | ICD-10-CM | POA: Diagnosis not present

## 2018-10-31 DIAGNOSIS — Z8571 Personal history of Hodgkin lymphoma: Secondary | ICD-10-CM

## 2018-10-31 DIAGNOSIS — I25118 Atherosclerotic heart disease of native coronary artery with other forms of angina pectoris: Secondary | ICD-10-CM

## 2018-10-31 MED ORDER — ASPIRIN EC 81 MG PO TBEC: 81.0000 mg | DELAYED_RELEASE_TABLET | Freq: Every day | ORAL | 3 refills | Status: DC

## 2018-10-31 MED ORDER — NITROGLYCERIN 0.4 MG SL SUBL: 0.4000 mg | SUBLINGUAL_TABLET | SUBLINGUAL | 3 refills | Status: DC | PRN

## 2018-10-31 NOTE — Progress Notes (Signed)
Virtual Visit via Video Note: This visit type was conducted due to national recommendations for restrictions regarding the COVID-19 Pandemic (e.g. social distancing).  This format is felt to be most appropriate for this patient at this time.  All issues noted in this document were discussed and addressed.  No physical exam was performed (except for noted visual exam findings with Telehealth visits).  The patient has consented to conduct a Telehealth visit and understands insurance will be billed.   I connected with@, on 11/02/18 at  by a video enabled telemedicine application and verified that I am speaking with the correct person using two identifiers.   I discussed the limitations of evaluation and management by telemedicine and the availability of in person appointments. The patient expressed understanding and agreed to proceed.   I have discussed with patient regarding the safety during COVID Pandemic and steps and precautions to be taken including social distancing, frequent hand wash and use of detergent soap, gels with the patient. I asked the patient to avoid touching mouth, nose, eyes, ears with her hands. I encouraged regular walking around the neighborhood and exercise and regular diet, as long as social distancing can be maintained.  Subjective:  Primary Physician:  Nolene Ebbs, MD  Patient ID: Thomas [name], male    DOB: August 15, 1980, 38 y.o.   MRN: [id number]  Chief Complaint  Patient presents with   Chest Pain   New Patient (Initial Visit)    HPI: Thomas [name]  is a 38 y.o. 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 He was admitted to [facility] on 05/15/2014 in [place] fib had to be defibrillated 3. Underwent coronary angioplasty on 05/17/2014 for high-grade circumflex  coronary artery stenosis.   Patient was admitted to [facility] with non-ST elevation myocardial infarction on 10/27/2015, underwent coronary angiography and was found to have widely patent circumflex marginal stent and balloon angioplasty site at the distal circumflex. However midsegment of the circumflex had a 70% stenosis in the PL branch and a 70% stenosis but was left alone due to severe diffuse nature of the disease and small vessels.  With a past few weeks, he has noticed chest tightness while he is doing routine activities around the house.  Has also noticed mild dyspnea.  Has been concerned about this, states that chest discomfort is easily relieved with nitroglycerin but has been coming on more frequently.  No leg edema, no rest pain. He was last seen by me almost 2 years ago.  Past Medical History:  Diagnosis Date   Acute respiratory failure with hypoxemia: post cardiac arrest 05/19/2014   Cancer ([place])    hodgkins, no current treatment for   Cardiac arrest ([place]) 05/15/2014   Chronic renal insufficiency    CKD (chronic kidney disease) s/p Right nephrectomy (04/30/2014) 05/19/2014   CLABSI (central line-associated bloodstream infection) 02/27/2014   Enteritis 01/22/2014   History of blood transfusion july 2015 per pt   HIV disease ([place]) 06/25/2012   Hydronephrosis, right & hydroureter: secondary to obstructing ureteral calculi 03/01/2014   Leg pain 07/03/2012   Nephrolithiasis    NSTEMI (non-ST elevated myocardial infarction) ([place]) 05/19/2014   Obstructive uropathy 12/12/2014   Status post left renal stent   Penile cyst 08/06/2012   Septic shock ([place]) 01/18/2014   Shingles rash  02/25/2014   Sinus tachycardia 08/06/2012   Skin lesion 07/03/2012   Staphylococcus aureus bacteremia 02/25/2014   Transaminitis 05/19/2014    Past Surgical History:  Procedure Laterality Date   ABCESS DRAINAGE  08/2008   drainage of perirectal abcess with fistulotomy of  chronic  fistula-in-ano.  this after several previous I and Ds of rectal abcess.    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   CARDIAC CATHETERIZATION  05/19/2014   CARDIAC CATHETERIZATION N/A 10/28/2015   Procedure: Left Heart Cath and Coronary Angiography;  Surgeon: Adrian Prows, MD;  Location: [place] [facility];  Service: Cardiovascular;  Laterality: N/A;   CORONARY STENT PLACEMENT  05/19/2014   OMI   & PROXIMAL CIRCUMFLEX   CYSTOSCOPY WITH RETROGRADE PYELOGRAM, URETEROSCOPY AND STENT PLACEMENT Left 12/11/2014   Procedure: CYSTOSCOPY WITH RETROGRADE PYELOGRAM, URETEROSCOPY, STONE REMOVAL, AND STENT PLACEMENT;  Surgeon: Kathie Rhodes, MD;  Location: [facility];  Service: Urology;  Laterality: Left;   HOLMIUM LASER APPLICATION Left 08/11/1476   Procedure: HOLMIUM LASER APPLICATION;  Surgeon: Kathie Rhodes, MD;  Location: [facility];  Service: Urology;  Laterality: Left;   LAPAROSCOPIC NEPHRECTOMY Right 04/30/2014   Procedure: LAPAROSCOPIC NEPHRECTOMY AND URETERECTOMY;  Surgeon: Raynelle Bring, MD;  Location: [facility];  Service: Urology;  Laterality: Right;   LEFT HEART CATHETERIZATION WITH CORONARY ANGIOGRAM N/A 05/18/2014   Procedure: LEFT HEART CATHETERIZATION WITH CORONARY ANGIOGRAM;  Surgeon: Laverda Page, MD;  [facility];  Service: Cardiovascular;  Laterality: N/A;   pac placed and later removed     PERCUTANEOUS CORONARY STENT INTERVENTION (PCI-S) N/A 05/19/2014   Procedure: PERCUTANEOUS CORONARY STENT INTERVENTION (PCI-S);  Surgeon: Laverda Page, MD;  Location: [facility] CATH LAB;  Service: Cardiovascular;  Laterality: N/A;   right nephrostomy tube  for last month and 1/2   SUPRACLAVICAL NODE BIOPSY Right 08/08/2013   Procedure: SUPRACLAVICULAR LYMPH NODE BIOPSY;  Surgeon: Gaye Pollack, MD;  Location: [place] OR;  Service: Thoracic;  Laterality: Right;   TEE WITHOUT CARDIOVERSION N/A 02/27/2014   Procedure: TRANSESOPHAGEAL ECHOCARDIOGRAM (TEE);  Surgeon: Pixie Casino, MD;  Location: [facility] ENDOSCOPY;   Service: Cardiovascular;  Laterality: N/A;    Social History   Socioeconomic History   Marital status: Single    Spouse name: Not on file   Number of children: 0   Years of education: Not on file   Highest education level: Not on file  Occupational History   Not on file  Social Needs   Financial resource strain: Not on file   Food insecurity:    Worry: Not on file    Inability: Not on file   Transportation needs:    Medical: Not on file    Non-medical: Not on file  Tobacco Use   Smoking status: Former Smoker    Packs/day: 1.00    Years: 15.00    Pack years: 15.00    Types: Cigarettes    Start date: 08/24/2013    Last attempt to quit: 09/21/2013    Years since quitting: 5.1   Smokeless tobacco: Never Used   Tobacco comment: quitline info, counseling  Substance and Sexual Activity   Alcohol use: No   Drug use: No    Types: Marijuana    Comment: hasn't smoked marijuana in "a while".    Sexual activity: Not Currently    Partners: Female    Birth control/protection: Condom    Comment: declined condoms  Lifestyle   Physical activity:    Days per week: Not on file  Minutes per session: Not on file   Stress: Not on file  Relationships   Social connections:    Talks on phone: Not on file    Gets together: Not on file    Attends religious service: Not on file    Active member of club or organization: Not on file    Attends meetings of clubs or organizations: Not on file    Relationship status: Not on file   Intimate partner violence:    Fear of current or ex partner: Not on file    Emotionally abused: Not on file    Physically abused: Not on file    Forced sexual activity: Not on file  Other Topics Concern   Not on file  Social History Narrative   Not on file    Current Outpatient Medications on File Prior to Visit  Medication Sig Dispense Refill   TRIUMEQ 600-50-300 MG tablet TAKE ONE TABLET BY MOUTH DAILY 30 tablet 1   atorvastatin  (LIPITOR) 80 MG tablet Take 1 tablet (80 mg total) by mouth daily. (Patient not taking: Reported on 10/31/2018) 30 tablet 1   ferrous sulfate 325 (65 FE) MG tablet TAKE 1 TABLET (325 MG TOTAL) BY MOUTH TWO   (TWO) TIMES DAILY WITH A MEAL. (Patient not taking: No sig reported) 60 tablet 0   isosorbide mononitrate (IMDUR) 30 MG 24 hr tablet Take 1 tablet (30 mg total) by mouth daily. (Patient not taking: Reported on 10/31/2018) 30 tablet 1   metoprolol tartrate (LOPRESSOR) 25 MG tablet Take 1 tablet (25 mg total) by mouth 2 (two) times daily. (Patient not taking: Reported on 10/31/2018) 60 tablet 1   ticagrelor (BRILINTA) 60 MG TABS tablet Take 1 tablet (60 mg total) by mouth 2 (two) times daily. (Patient not taking: Reported on 10/31/2018) 60 tablet 1   No current facility-administered medications on file prior to visit.     Review of Systems  Constitution: Negative for chills, decreased appetite, malaise/fatigue and weight gain.  Cardiovascular: Positive for chest pain and dyspnea on exertion. Negative for leg swelling and syncope.  Endocrine: Negative for cold intolerance.  Hematologic/Lymphatic: Does not bruise/bleed easily.  Musculoskeletal: Negative for joint swelling.  Gastrointestinal: Negative for abdominal pain, anorexia and change in bowel habit.  Neurological: Negative for headaches and light-headedness.  Psychiatric/Behavioral: Negative for depression and substance abuse.  All other systems reviewed and are negative.      Objective:  Height 6' 1"  (1.854 m), weight 210 lb (95.3 kg). Body mass index is 27.71 kg/m.  Physical Exam  Constitutional: He is oriented to person, place, and time. He appears well-nourished. No distress.  Pulmonary/Chest: Effort normal.  Musculoskeletal: Normal range of motion.        General: No edema.  Neurological: He is alert and oriented to person, place, and time.   Radiology: No results found.  Laboratory Examination:  Labs 02/28/2017: HB  14.5/HCT 39.7, mild macrocytic indices, platelets 160, CBC count 4.3 BUN 21, creatinine 2.1, eGFR 44 mL calcium 4.3, CMP otherwise normal. TSH normal, total cholesterol 202, triglycerides 249, HDL 41, LDL 111.  Labs 12/30/2015: BUN 20, creatinine 1.96, eGFR 44 mL, HB 13.3/HCT 39.3, platelets 171.  CMP Latest Ref Rng & Units 03/13/2018 03/11/2018 09/06/2017  Glucose 70 - 99 mg/dL 91 95 84  BUN 6 - 20 mg/dL 18 23 20   Creatinine 0.61 - 1.24 mg/dL 1.93(H) 2.00(H) 2.23(H)  Sodium 135 - 145 mmol/L 142 140 141  Potassium 3.5 - 5.1 mmol/L 3.9  4.5 4.5  Chloride 98 - 111 mmol/L 108 106 107  CO2 22 - 32 mmol/L 26 27 26   Calcium 8.9 - 10.3 mg/dL 9.2 9.5 9.4  Total Protein 6.5 - 8.1 g/dL 7.8 7.8 7.1  Total Bilirubin 0.3 - 1.2 mg/dL 0.3 0.4 0.3  Alkaline Phos 38 - 126 U/L 61 - -  AST 15 - 41 U/L 15 15 17   ALT 0 - 44 U/L 16 14 16    CBC Latest Ref Rng & Units 03/13/2018 09/06/2017 03/06/2017  WBC 4.0 - 10.3 K/uL 5.2 3.2(L) 4.0  Hemoglobin 13.0 - 17.1 g/dL 16.0 14.6 14.8  Hematocrit 38.4 - 49.9 % 47.1 41.1 43.3  Platelets 140 - 400 K/uL 127(L) 180 184   Lipid Panel     Component Value Date/Time   CHOL 119 10/28/2015 0509   TRIG 62 10/28/2015 0509   HDL 36 (L) 10/28/2015 0509   CHOLHDL 3.3 10/28/2015 0509   VLDL 12 10/28/2015 0509   LDLCALC 71 10/28/2015 0509   HEMOGLOBIN A1C Lab Results  Component Value Date   HGBA1C 5.3 12/10/2014   MPG 105 12/10/2014   TSH No results for input(s): TSH in the last 8760 hours.  CARDIAC STUDIES:    Coronary angiogram 10/27/2015 widely patent circumflex marginal stent and balloon angioplasty site at the distal circumflex performed 05/19/14. However midsegment of the circumflex had a 70% stenosis in the PL branch and a 70% stenosis but was left alone due to severe diffuse nature of the disease and small vessels.  Echo 05/17/2014: Essentially normal echo with moderate pulmonary hypertension. No wall motion abnormality Impression: EKG 02/28/2017: Normal sinus  rhythm at rate of 63 bpm, normal axis. No evidence of ischemia. Early R-wave progression in septal leads, cannot exclude posterior infarct old. (lead placement). No evidence of ischemia. Normal QT interval.  Assessment:    Coronary artery disease of native artery of native heart with stable angina pectoris (HCC) - Plan: nitroGLYCERIN (NITROSTAT) 0.4 MG SL tablet, aspirin EC 81 MG tablet, Lipid Panel With LDL/HDL Ratio, DISCONTINUED: aspirin EC 81 MG tablet  Stage 3 chronic kidney disease (HCC) S/P right nephrectomy due to atrophic kidney and recurrent infection in 2015 - Plan: Comprehensive metabolic panel  H/O Hodgkin's lymphoma - Plan: CBC  Pure hypercholesterolemia  EKG 03/05/2015: Normal sinus rhythm at rate of 86 bpm, normal axis, no evidence of ischemia, normal EKG. Early repolarization. No significant change from EKG  Recommendation:  Patient's symptoms are worrisome for progressive angina pectoris. He has discontinued all his medications.  I restarted aspirin along with prescription for sublingual nitroglycerin, would like to obtain labs including lipid profile testing, CBC and CMP.  I'll set him up for a office visit in one month to follow-up on his chest discomfort which is very worrisome for angina pectoris.  Clinically he appeared well, no acute distress, and in review of no PND, leg edema, rest dyspnea, do not suspect ACS.  He'll need repeat echocardiogram and also exercise Myoview stress test.  I'll see him back after the labs for follow-up in the office visit, patient has been following social distancing and has had no fever, chills, sore throat, productive cough. He is aware to go to the ED if Chest pain is not easily relieved or he has to use frequent S/L NTG.   Adrian Prows, MD, [facility] 10/31/2018, 5:12 PM [facility] [place] Pager: [phone] Office: [phone] If no answer Cell [phone]

## 2018-11-13 ENCOUNTER — Inpatient Hospital Stay: Payer: Medicare HMO | Attending: Oncology | Admitting: Oncology

## 2018-11-13 ENCOUNTER — Other Ambulatory Visit: Payer: Medicare HMO

## 2018-11-13 DIAGNOSIS — C8100 Nodular lymphocyte predominant Hodgkin lymphoma, unspecified site: Secondary | ICD-10-CM

## 2018-11-13 NOTE — Progress Notes (Signed)
Hematology and Oncology Follow Up for Telemedicine Visits  KEOKI MCHARGUE [id number] [birth year] 38 y.o. 11/13/2018 12:45 PM Nolene Ebbs, MDAvbuere, Christean Grief, MD   I connected with Mr. Mcgovern on 11/13/18 at  1:30 PM EDT by telephone visit and verified that I am speaking with the correct person using two identifiers.   I discussed the limitations, risks, security and privacy concerns of performing an evaluation and management service by telemedicine and the availability of in-person appointments. I also discussed with the patient that there may be a patient responsible charge related to this service. The patient expressed understanding and agreed to proceed.  Other persons participating in the visit and their role in the encounter: None  Patient's location: Home Provider's location: Office  Chief Complaint: No complaints  Principle Diagnosis: 38 year old man with stage IIIa Hodgkin's disease diagnosed in January 2015 after presenting with lymphadenopathy in the setting of HIV.   Prior Therapy:  ABVD chemotherapy started on 10/03/2013. He S/P 5 cycles last of which given in July 2015. Therapy discontinued due to sepsis, kidney surgery and myocardial infarction.  PET scan in September 2015 and April 2016 showed complete response to therapy.  Current therapy: Active surveillance  Interim History: Mr. Alfrey reports feeling well without any recent complaints.  He did have some angina enzymes and was prescribed nitroglycerin and has been taking aspirin without any issues.  He denies any worsening adenopathy or constitutional symptoms his performance status and quality of life remain excellent.  He denies any recent hospitalization or illnesses.     Medications: I have reviewed the patient's current medications.  Current Outpatient Medications  Medication Sig Dispense Refill  . aspirin EC 81 MG tablet Take 1 tablet (81 mg total) by mouth daily. 90 tablet 3  . atorvastatin (LIPITOR) 80 MG  tablet Take 1 tablet (80 mg total) by mouth daily. (Patient not taking: Reported on 10/31/2018) 30 tablet 1  . ferrous sulfate 325 (65 FE) MG tablet TAKE 1 TABLET (325 MG TOTAL) BY MOUTH TWO   (TWO) TIMES DAILY WITH A MEAL. (Patient not taking: No sig reported) 60 tablet 0  . isosorbide mononitrate (IMDUR) 30 MG 24 hr tablet Take 1 tablet (30 mg total) by mouth daily. (Patient not taking: Reported on 10/31/2018) 30 tablet 1  . metoprolol tartrate (LOPRESSOR) 25 MG tablet Take 1 tablet (25 mg total) by mouth 2 (two) times daily. (Patient not taking: Reported on 10/31/2018) 60 tablet 1  . nitroGLYCERIN (NITROSTAT) 0.4 MG SL tablet Place 1 tablet (0.4 mg total) under the tongue every 5 (five) minutes as needed for up to 25 days for chest pain. 25 tablet 3  . ticagrelor (BRILINTA) 60 MG TABS tablet Take 1 tablet (60 mg total) by mouth 2 (two) times daily. (Patient not taking: Reported on 10/31/2018) 60 tablet 1  . TRIUMEQ 600-50-300 MG tablet TAKE ONE TABLET BY MOUTH DAILY 30 tablet 1   No current facility-administered medications for this visit.      Allergies:  Allergies  Allergen Reactions  . Tylenol [Acetaminophen] Other (See Comments)    sweats    Past Medical History, Surgical history, Social history, and Family History were reviewed and updated.      Lab Results: Lab Results  Component Value Date   WBC 5.2 03/13/2018   HGB 16.0 03/13/2018   HCT 47.1 03/13/2018   MCV 105.5 (H) 03/13/2018   PLT 127 (L) 03/13/2018     Chemistry      Component Value Date/Time  NA 142 03/13/2018 1506   NA 141 10/07/2015 1442   K 3.9 03/13/2018 1506   K 4.2 10/07/2015 1442   CL 108 03/13/2018 1506   CO2 26 03/13/2018 1506   CO2 23 10/07/2015 1442   BUN 18 03/13/2018 1506   BUN 20.8 10/07/2015 1442   CREATININE 1.93 (H) 03/13/2018 1506   CREATININE 2.00 (H) 03/11/2018 1429   CREATININE 2.3 (H) 10/07/2015 1442      Component Value Date/Time   CALCIUM 9.2 03/13/2018 1506   CALCIUM 9.0  10/07/2015 1442   ALKPHOS 61 03/13/2018 1506   ALKPHOS 91 10/07/2015 1442   AST 15 03/13/2018 1506   AST 16 10/07/2015 1442   ALT 16 03/13/2018 1506   ALT 17 10/07/2015 1442   BILITOT 0.3 03/13/2018 1506   BILITOT 0.34 10/07/2015 1442          Impression and Plan:  38 year old man with:  1. Hodgkin's disease diagnosed in 2015 after presenting with lymphadenopathy and anemia.  Marland Kitchen    He remains in remission without any evidence of disease relapse.  5 years from has diagnosis.  The natural course of this disease was discussed the risk of relapse was assessed.  The time being I have recommended continue active surveillance at this time with clinical visit and laboratory testing in 6 months.   2. Anemia: Related to his malignancy with hemoglobin normalizing.  We will continue to follow periodically.  3.  Follow-up: We will be in 6 months for repeat laboratory testing and examination.  I provided 20 minutes of non face-to-face telephone visit time during this encounter, and > 50% was dedicated to reviewing his disease status, laboratory data and answering questions regarding future plan of care.  Zola Button, MD 11/13/2018 12:45 PM

## 2018-11-14 ENCOUNTER — Telehealth: Payer: Self-pay | Admitting: Oncology

## 2018-11-14 NOTE — Telephone Encounter (Signed)
Scheduled appt per 4/22 sch message.

## 2018-11-22 ENCOUNTER — Other Ambulatory Visit: Payer: Self-pay | Admitting: Internal Medicine

## 2018-12-03 DIAGNOSIS — I25118 Atherosclerotic heart disease of native coronary artery with other forms of angina pectoris: Secondary | ICD-10-CM | POA: Diagnosis not present

## 2018-12-03 DIAGNOSIS — N183 Chronic kidney disease, stage 3 (moderate): Secondary | ICD-10-CM | POA: Diagnosis not present

## 2018-12-03 DIAGNOSIS — Z8571 Personal history of Hodgkin lymphoma: Secondary | ICD-10-CM | POA: Diagnosis not present

## 2018-12-04 LAB — LIPID PANEL WITH LDL/HDL RATIO
Cholesterol, Total: 223 mg/dL — ABNORMAL HIGH (ref 100–199)
HDL: 37 mg/dL — ABNORMAL LOW (ref >39)
LDL Calculated: 148 mg/dL — ABNORMAL HIGH (ref 0–99)
LDl/HDL Ratio: 4 ratio — ABNORMAL HIGH (ref 0.0–3.6)
Triglycerides: 192 mg/dL — ABNORMAL HIGH (ref 0–149)
VLDL Cholesterol Cal: 38 mg/dL (ref 5–40)

## 2018-12-04 LAB — COMPREHENSIVE METABOLIC PANEL
ALT: 27 IU/L (ref 0–44)
AST: 24 IU/L (ref 0–40)
Albumin/Globulin Ratio: 1.7 (ref 1.2–2.2)
Albumin: 4.5 g/dL (ref 4.0–5.0)
Alkaline Phosphatase: 57 IU/L (ref 39–117)
BUN/Creatinine Ratio: 11 (ref 9–20)
BUN: 22 mg/dL — ABNORMAL HIGH (ref 6–20)
Bilirubin Total: 0.3 mg/dL (ref 0.0–1.2)
CO2: 22 mmol/L (ref 20–29)
Calcium: 9.4 mg/dL (ref 8.7–10.2)
Chloride: 105 mmol/L (ref 96–106)
Creatinine, Ser: 1.98 mg/dL — ABNORMAL HIGH (ref 0.76–1.27)
GFR calc Af Amer: 49 mL/min/{1.73_m2} — ABNORMAL LOW (ref >59)
GFR calc non Af Amer: 43 mL/min/{1.73_m2} — ABNORMAL LOW (ref >59)
Globulin, Total: 2.7 g/dL (ref 1.5–4.5)
Glucose: 84 mg/dL (ref 65–99)
Potassium: 4.5 mmol/L (ref 3.5–5.2)
Sodium: 142 mmol/L (ref 134–144)
Total Protein: 7.2 g/dL (ref 6.0–8.5)

## 2018-12-04 LAB — CBC
Hematocrit: 43.4 % (ref 37.5–51.0)
Hemoglobin: 15.5 g/dL (ref 13.0–17.7)
MCH: 36 pg — ABNORMAL HIGH (ref 26.6–33.0)
MCHC: 35.7 g/dL (ref 31.5–35.7)
MCV: 101 fL — ABNORMAL HIGH (ref 79–97)
Platelets: 177 10*3/uL (ref 150–450)
RBC: 4.3 x10E6/uL (ref 4.14–5.80)
RDW: 12.9 % (ref 11.6–15.4)
WBC: 4.1 10*3/uL (ref 3.4–10.8)

## 2018-12-12 ENCOUNTER — Encounter: Payer: Self-pay | Admitting: Cardiology

## 2018-12-12 ENCOUNTER — Ambulatory Visit (INDEPENDENT_AMBULATORY_CARE_PROVIDER_SITE_OTHER): Payer: Medicare HMO | Admitting: Cardiology

## 2018-12-12 ENCOUNTER — Other Ambulatory Visit: Payer: Self-pay

## 2018-12-12 VITALS — BP 118/82 | HR 100 | Temp 97.0°F | Ht 73.0 in | Wt 204.0 lb

## 2018-12-12 DIAGNOSIS — N183 Chronic kidney disease, stage 3 unspecified: Secondary | ICD-10-CM

## 2018-12-12 DIAGNOSIS — I25118 Atherosclerotic heart disease of native coronary artery with other forms of angina pectoris: Secondary | ICD-10-CM

## 2018-12-12 DIAGNOSIS — E7800 Pure hypercholesterolemia, unspecified: Secondary | ICD-10-CM

## 2018-12-12 MED ORDER — ATORVASTATIN CALCIUM 40 MG PO TABS: 40.0000 mg | ORAL_TABLET | Freq: Every day | ORAL | 1 refills | Status: DC

## 2018-12-12 MED ORDER — METOPROLOL SUCCINATE ER 25 MG PO TB24: 25.0000 mg | ORAL_TABLET | Freq: Every day | ORAL | 1 refills | Status: DC

## 2018-12-12 MED ORDER — NITROGLYCERIN 0.4 MG SL SUBL: 0.4000 mg | SUBLINGUAL_TABLET | SUBLINGUAL | 3 refills | Status: DC | PRN

## 2018-12-12 NOTE — Progress Notes (Signed)
Subjective:  Primary Physician:  [name], MD  Patient ID: Thomas [name], male    DOB: 1981/05/19, 38 y.o.   MRN: [id number]  Chief Complaint  Patient presents with  . Coronary Artery Disease    pt is confused about his meds, not taking them   . Chest Pain    HPI: [name]  is a 38 y.o. 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 He was admitted to [facility] on 05/15/2014 in [place]. fib had to be defibrillated 3. Underwent coronary angioplasty on 05/17/2014 for high-grade circumflex coronary artery stenosis.   Patient was admitted to [facility] with non-ST elevation myocardial infarction on 10/27/2015, underwent coronary angiography and was found to have widely patent circumflex marginal stent and balloon angioplasty site at the distal circumflex. However midsegment of the circumflex had a 70% stenosis in the PL branch and a 70% stenosis but was left alone due to severe diffuse nature of the disease and small vessels.  With a past few months, he has noticed chest tightness while he is doing routine activities around the house.  Has also noticed mild dyspnea.  Has been concerned about this, states that chest discomfort is easily relieved with nitroglycerin, he has finished all the s/l NTG I sent him 6 weeks ago. He has slowed down walking due to dyspnea and chest pain and started last few months. No PND or orthopnea.   Past Medical History:  Diagnosis Date  . Acute respiratory failure with hypoxemia: post cardiac arrest 05/19/2014  . Cancer ([place])    hodgkins, no current treatment for  . Cardiac arrest ([place]) 05/15/2014  . Chronic renal insufficiency   . CKD (chronic kidney disease) s/p Right nephrectomy (04/30/2014) 05/19/2014  . CLABSI (central line-associated  bloodstream infection) 02/27/2014  . Enteritis 01/22/2014  . History of blood transfusion july 2015 per pt  . HIV disease ([place]) 06/25/2012  . Hydronephrosis, right & hydroureter: secondary to obstructing ureteral calculi 03/01/2014  . Leg pain 07/03/2012  . Nephrolithiasis   . NSTEMI (non-ST elevated myocardial infarction) ([place]) 05/19/2014  . Obstructive uropathy 12/12/2014   Status post left renal stent  . Penile cyst 08/06/2012  . Septic shock ([place]) 01/18/2014  . Shingles rash 02/25/2014  . Sinus tachycardia 08/06/2012  . Skin lesion 07/03/2012  . Staphylococcus aureus bacteremia 02/25/2014  . Transaminitis 05/19/2014    Past Surgical History:  Procedure Laterality Date  . 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  this after several previous I and Ds of rectal abcess.   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 CARDIAC CATHETERIZATION  05/19/2014  . CARDIAC CATHETERIZATION N/A 10/28/2015   Procedure: Left Heart Cath and Coronary Angiography;  Surgeon: Adrian Prows, MD;  Location: [facility];  Service: Cardiovascular;  Laterality: N/A;  . CORONARY STENT PLACEMENT  05/19/2014   OMI   & PROXIMAL CIRCUMFLEX  . CYSTOSCOPY WITH RETROGRADE PYELOGRAM, URETEROSCOPY AND STENT PLACEMENT Left 12/11/2014   Procedure: CYSTOSCOPY WITH RETROGRADE PYELOGRAM, URETEROSCOPY, STONE REMOVAL, AND STENT PLACEMENT;  Surgeon: Kathie Rhodes, MD;  Location: [facility];  Service: Urology;  Laterality: Left;  . HOLMIUM LASER APPLICATION Left 1/49/7026   Procedure: HOLMIUM LASER APPLICATION;  Surgeon: Kathie Rhodes, MD;  Location:  [facility];  Service: Urology;  Laterality: Left;  . LAPAROSCOPIC NEPHRECTOMY Right 04/30/2014   Procedure: LAPAROSCOPIC NEPHRECTOMY AND URETERECTOMY;  Surgeon: Raynelle Bring, MD;  Location: [facility];  Service: Urology;  Laterality: Right;  . LEFT HEART CATHETERIZATION WITH CORONARY ANGIOGRAM N/A 05/18/2014   Procedure: LEFT HEART CATHETERIZATION WITH  CORONARY ANGIOGRAM;  Surgeon: Laverda Page, MD;  Location: [facility] CATH LAB;  Service: Cardiovascular;  Laterality: N/A;  . pac placed and later removed    . PERCUTANEOUS CORONARY STENT INTERVENTION (PCI-S) N/A 05/19/2014   Procedure: PERCUTANEOUS CORONARY STENT INTERVENTION (PCI-S);  Surgeon: Laverda Page, MD;  Location: [facility] CATH LAB;  Service: Cardiovascular;  Laterality: N/A;  . right nephrostomy tube  for last month and 1/2  . SUPRACLAVICAL NODE BIOPSY Right 08/08/2013   Procedure: SUPRACLAVICULAR LYMPH NODE BIOPSY;  Surgeon: Gaye Pollack, MD;  Location: [facility] OR;  Service: Thoracic;  Laterality: Right;  . TEE WITHOUT CARDIOVERSION N/A 02/27/2014   Procedure: TRANSESOPHAGEAL ECHOCARDIOGRAM (TEE);  Surgeon: Pixie Casino, MD;  Location: [facility] ENDOSCOPY;  Service: Cardiovascular;  Laterality: N/A;    Social History   Socioeconomic History  . Marital status: Single    Spouse name: Not on file  . Number of children: 0  . Years of education: Not on file  . Highest education level: Not on file  Occupational History  . Not on file  Social Needs  . Financial resource strain: Not on file  . Food insecurity:    Worry: Not on file    Inability: Not on file  . Transportation needs:    Medical: Not on file    Non-medical: Not on file  Tobacco Use  . Smoking status: Former Smoker    Packs/day: 1.00    Years: 15.00    Pack years: 15.00    Types: Cigarettes    Start date: 08/24/2013    Last attempt to quit: 09/21/2013    Years since quitting: 5.2  . Smokeless tobacco: Never Used  . Tobacco comment: quitline info, counseling  Substance and Sexual Activity  . Alcohol use: No  . Drug use: No    Types: Marijuana    Comment: hasn't smoked marijuana in "a while".   . Sexual activity: Not Currently    Partners: Female    Birth control/protection: Condom    Comment: declined condoms  Lifestyle  . Physical activity:    Days per week: Not on file    Minutes per session: Not on file  .  Stress: Not on file  Relationships  . Social connections:    Talks on phone: Not on file    Gets together: Not on file    Attends religious service: Not on file    Active member of club or organization: Not on file    Attends meetings of clubs or organizations: Not on file    Relationship status: Not on file  . Intimate partner violence:    Fear of current or ex partner: Not on file    Emotionally abused: Not on file    Physically abused: Not on file    Forced sexual activity: Not on file  Other Topics Concern  . Not on file  Social History Narrative  . Not on file    Current Outpatient Medications on File Prior to Visit  Medication Sig Dispense Refill  . aspirin EC 81 MG tablet Take 1 tablet (81 mg total) by mouth daily. 90 tablet 3  . TRIUMEQ 600-50-300 MG tablet TAKE  ONE TABLET BY MOUTH DAILY 30 tablet 0  . ferrous sulfate 325 (65 FE) MG tablet TAKE 1 TABLET (325 MG TOTAL) BY MOUTH TWO   (TWO) TIMES DAILY WITH A MEAL. (Patient not taking: No sig reported) 60 tablet 0  . isosorbide mononitrate (IMDUR) 30 MG 24 hr tablet Take 1 tablet (30 mg total) by mouth daily. (Patient not taking: Reported on 10/31/2018) 30 tablet 1  . metoprolol tartrate (LOPRESSOR) 25 MG tablet Take 1 tablet (25 mg total) by mouth 2 (two) times daily. (Patient not taking: Reported on 10/31/2018) 60 tablet 1   No current facility-administered medications on file prior to visit.     Review of Systems  Constitution: Negative for chills, decreased appetite, malaise/fatigue and weight gain.  Cardiovascular: Positive for chest pain and dyspnea on exertion. Negative for leg swelling and syncope.  Endocrine: Negative for cold intolerance.  Hematologic/Lymphatic: Does not bruise/bleed easily.  Musculoskeletal: Negative for joint swelling.  Gastrointestinal: Negative for abdominal pain, anorexia and change in bowel habit.  Neurological: Negative for headaches and light-headedness.  Psychiatric/Behavioral: Negative for  depression and substance abuse.  All other systems reviewed and are negative.     Objective:  Blood pressure 118/82, pulse 100, temperature (!) 97 F (36.1 C), height 6' 1"  (1.854 m), weight 204 lb (92.5 kg), SpO2 98 %. Body mass index is 26.91 kg/m.  Physical Exam  Constitutional: He appears well-developed and well-nourished. No distress.  HENT:  Head: Atraumatic.  Eyes: Conjunctivae are normal.  Neck: Neck supple. No JVD present. No thyromegaly present.  Cardiovascular: Normal rate, regular rhythm, normal heart sounds and intact distal pulses. Exam reveals no gallop.  No murmur heard. Pulmonary/Chest: Effort normal and breath sounds normal.  Abdominal: Soft. Bowel sounds are normal.  Musculoskeletal: Normal range of motion.  Neurological: He is alert.  Skin: Skin is warm and dry.  Psychiatric: He has a normal mood and affect.   Radiology: No results found.  Laboratory Examination:  Labs 02/28/2017: HB 14.5/HCT 39.7, mild macrocytic indices, platelets 160, CBC count 4.3 BUN 21, creatinine 2.1, eGFR 44 mL calcium 4.3, CMP otherwise normal. TSH normal, total cholesterol 202, triglycerides 249, HDL 41, LDL 111.  Labs 12/30/2015: BUN 20, creatinine 1.96, eGFR 44 mL, HB 13.3/HCT 39.3, platelets 171.  CMP Latest Ref Rng & Units 12/03/2018 03/13/2018 03/11/2018  Glucose 65 - 99 mg/dL 84 91 95  BUN 6 - 20 mg/dL 22(H) 18 23  Creatinine 0.76 - 1.27 mg/dL 1.98(H) 1.93(H) 2.00(H)  Sodium 134 - 144 mmol/L 142 142 140  Potassium 3.5 - 5.2 mmol/L 4.5 3.9 4.5  Chloride 96 - 106 mmol/L 105 108 106  CO2 20 - 29 mmol/L 22 26 27   Calcium 8.7 - 10.2 mg/dL 9.4 9.2 9.5  Total Protein 6.0 - 8.5 g/dL 7.2 7.8 7.8  Total Bilirubin 0.0 - 1.2 mg/dL 0.3 0.3 0.4  Alkaline Phos 39 - 117 IU/L 57 61 -  AST 0 - 40 IU/L 24 15 15   ALT 0 - 44 IU/L 27 16 14    CBC Latest Ref Rng & Units 12/03/2018 03/13/2018 09/06/2017  WBC 3.4 - 10.8 x10E3/uL 4.1 5.2 3.2(L)  Hemoglobin 13.0 - 17.7 g/dL 15.5 16.0 14.6   Hematocrit 37.5 - 51.0 % 43.4 47.1 41.1  Platelets 150 - 450 x10E3/uL 177 127(L) 180   Lipid Panel     Component Value Date/Time   CHOL 223 (H) 12/03/2018 1330   TRIG 192 (H) 12/03/2018 1330   HDL 37 (L) 12/03/2018 1330  CHOLHDL 3.3 10/28/2015 0509   VLDL 12 10/28/2015 0509   LDLCALC 148 (H) 12/03/2018 1330   HEMOGLOBIN A1C Lab Results  Component Value Date   HGBA1C 5.3 12/10/2014   MPG 105 12/10/2014   TSH No results for input(s): TSH in the last 8760 hours.  CARDIAC STUDIES:    Coronary angiogram 10/27/2015 widely patent circumflex marginal stent and balloon angioplasty site at the distal circumflex performed 05/19/14. However midsegment of the circumflex had a 70% stenosis in the PL branch and a 70% stenosis but was left alone due to severe diffuse nature of the disease and small vessels.  Echo 05/17/2014: Essentially normal echo with moderate pulmonary hypertension. No wall motion abnormality Impression: EKG 02/28/2017: Normal sinus rhythm at rate of 63 bpm, normal axis. No evidence of ischemia. Early R-wave progression in septal leads, cannot exclude posterior infarct old. (lead placement). No evidence of ischemia. Normal QT interval.  Assessment:    Coronary artery disease of native artery of native heart with stable angina pectoris ([place]) - Plan: EKG 12-Lead, metoprolol succinate (TOPROL-XL) 25 MG 24 hr tablet, nitroGLYCERIN (NITROSTAT) 0.4 MG SL tablet, PCV ECHOCARDIOGRAM COMPLETE, PCV MYOCARDIAL PERFUSION WO LEXISCAN  Stage 3 chronic kidney disease (HCC) S/P right nephrectomy due to atrophic kidney and recurrent infection in 2015 - Plan: CMP14+EGFR, CMP14+EGFR  Pure hypercholesterolemia - Plan: atorvastatin (LIPITOR) 40 MG tablet, Lipid Panel With LDL/HDL Ratio, LDL cholesterol, direct, LDL cholesterol, direct, Lipid Panel With LDL/HDL Ratio  Chronic kidney disease, stage 3 ([place])  EKG 12/12/2018: Normal sinus rhythm at rate of 79 bpm, left atrial enlargement, otherwise  normal EKG. No significant change from  EKG 03/05/2015   Recommendation:   Patient's symptoms are worrisome for progressive angina pectoris. He is essentially discontinued all his cardiac medications were the past year and half.  I reviewed his Lipitor and also Nitroglycerin sublingual tablets along with low-dose metoprolol.  Compliance with medications were discussed and advised him that he can certainly come to visit [place] if he has any questions regarding the medications.  Schedule for a Lexiscan Sestamibi stress test to evaluate for myocardial ischemia. Patient unable to do treadmill stress testing due to COVID 19 to avoid aerosolization. Will schedule for an echocardiogram.  Labs in 6 weeks and OV. He is Advised to contact me immediately if he has chest pain or if he continues to have chest pain on a daily basis.  Adrian Prows, MD, [facility] 12/12/2018, 8:51 PM [facility] [place] Pager: [phone] Office: [phone] If no answer Cell [phone]

## 2018-12-26 ENCOUNTER — Ambulatory Visit: Payer: Self-pay | Admitting: Cardiology

## 2019-01-13 ENCOUNTER — Other Ambulatory Visit: Payer: Medicare HMO

## 2019-01-21 ENCOUNTER — Other Ambulatory Visit: Payer: Medicare HMO

## 2019-01-21 DIAGNOSIS — Z5329 Procedure and treatment not carried out because of patient's decision for other reasons: Secondary | ICD-10-CM

## 2019-02-14 ENCOUNTER — Other Ambulatory Visit: Payer: Self-pay | Admitting: Internal Medicine

## 2019-02-17 ENCOUNTER — Other Ambulatory Visit: Payer: Medicare HMO

## 2019-02-17 ENCOUNTER — Other Ambulatory Visit: Payer: Self-pay

## 2019-02-17 DIAGNOSIS — Z113 Encounter for screening for infections with a predominantly sexual mode of transmission: Secondary | ICD-10-CM | POA: Diagnosis not present

## 2019-02-17 DIAGNOSIS — R69 Illness, unspecified: Secondary | ICD-10-CM | POA: Diagnosis not present

## 2019-02-17 DIAGNOSIS — I25118 Atherosclerotic heart disease of native coronary artery with other forms of angina pectoris: Secondary | ICD-10-CM

## 2019-02-17 DIAGNOSIS — B20 Human immunodeficiency virus [HIV] disease: Secondary | ICD-10-CM

## 2019-02-18 LAB — T-HELPER CELL (CD4) - (RCID CLINIC ONLY)
CD4 % Helper T Cell: 46 % (ref 33–65)
CD4 T Cell Abs: 716 /uL (ref 400–1790)

## 2019-02-22 LAB — CBC WITH DIFFERENTIAL/PLATELET
Absolute Monocytes: 271 cells/uL (ref 200–950)
Basophils Absolute: 29 cells/uL (ref 0–200)
Basophils Relative: 0.7 %
Eosinophils Absolute: 41 cells/uL (ref 15–500)
Eosinophils Relative: 1 %
HCT: 42.6 % (ref 38.5–50.0)
Hemoglobin: 15 g/dL (ref 13.2–17.1)
Lymphs Abs: 1759 cells/uL (ref 850–3900)
MCH: 36.5 pg — ABNORMAL HIGH (ref 27.0–33.0)
MCHC: 35.2 g/dL (ref 32.0–36.0)
MCV: 103.6 fL — ABNORMAL HIGH (ref 80.0–100.0)
MPV: 11.2 fL (ref 7.5–12.5)
Monocytes Relative: 6.6 %
Neutro Abs: 2001 cells/uL (ref 1500–7800)
Neutrophils Relative %: 48.8 %
Platelets: 166 10*3/uL (ref 140–400)
RBC: 4.11 10*6/uL — ABNORMAL LOW (ref 4.20–5.80)
RDW: 12.6 % (ref 11.0–15.0)
Total Lymphocyte: 42.9 %
WBC: 4.1 10*3/uL (ref 3.8–10.8)

## 2019-02-22 LAB — COMPLETE METABOLIC PANEL WITH GFR
AG Ratio: 1.5 (calc) (ref 1.0–2.5)
ALT: 29 U/L (ref 9–46)
AST: 34 U/L (ref 10–40)
Albumin: 4.2 g/dL (ref 3.6–5.1)
Alkaline phosphatase (APISO): 48 U/L (ref 36–130)
BUN/Creatinine Ratio: 7 (calc) (ref 6–22)
BUN: 15 mg/dL (ref 7–25)
CO2: 24 mmol/L (ref 20–32)
Calcium: 8.7 mg/dL (ref 8.6–10.3)
Chloride: 109 mmol/L (ref 98–110)
Creat: 2.1 mg/dL — ABNORMAL HIGH (ref 0.60–1.35)
GFR, Est African American: 46 mL/min/{1.73_m2} — ABNORMAL LOW (ref >=60)
GFR, Est Non African American: 40 mL/min/{1.73_m2} — ABNORMAL LOW (ref >=60)
Globulin: 2.8 g/dL (calc) (ref 1.9–3.7)
Glucose, Bld: 96 mg/dL (ref 65–99)
Potassium: 4.1 mmol/L (ref 3.5–5.3)
Sodium: 140 mmol/L (ref 135–146)
Total Bilirubin: 0.3 mg/dL (ref 0.2–1.2)
Total Protein: 7 g/dL (ref 6.1–8.1)

## 2019-02-22 LAB — LIPID PANEL
Cholesterol: 207 mg/dL — ABNORMAL HIGH (ref <200)
HDL: 38 mg/dL — ABNORMAL LOW (ref >=40)
LDL Cholesterol (Calc): 138 mg/dL (calc) — ABNORMAL HIGH
Non-HDL Cholesterol (Calc): 169 mg/dL (calc) — ABNORMAL HIGH (ref <130)
Total CHOL/HDL Ratio: 5.4 (calc) — ABNORMAL HIGH (ref <5.0)
Triglycerides: 175 mg/dL — ABNORMAL HIGH (ref <150)

## 2019-02-22 LAB — RPR: RPR Ser Ql: NONREACTIVE

## 2019-02-22 LAB — HIV-1 RNA QUANT-NO REFLEX-BLD
HIV 1 RNA Quant: <20 copies/mL
HIV-1 RNA Quant, Log: <1.3 Log copies/mL

## 2019-02-24 ENCOUNTER — Ambulatory Visit: Payer: Medicare HMO | Admitting: Cardiology

## 2019-03-03 ENCOUNTER — Other Ambulatory Visit: Payer: Medicare HMO

## 2019-03-05 ENCOUNTER — Encounter: Payer: Medicare HMO | Admitting: Internal Medicine

## 2019-03-10 ENCOUNTER — Telehealth: Payer: Medicare HMO | Admitting: Cardiology

## 2019-03-16 ENCOUNTER — Other Ambulatory Visit: Payer: Self-pay | Admitting: Oncology

## 2019-03-16 MED ORDER — TRIUMEQ 600-50-300 MG PO TABS: 1.0000 | ORAL_TABLET | Freq: Every day | ORAL | 0 refills | Status: DC

## 2019-03-16 NOTE — Progress Notes (Signed)
Patient called to let us know he had run out of Triumeq; I renewed script

## 2019-03-17 ENCOUNTER — Telehealth: Payer: Self-pay

## 2019-03-17 NOTE — Telephone Encounter (Signed)
Follow up call to patient spouse Tye [place] and encouraged her to contact Dr. Linus Salmons with ID for the [facility] refill. She stated that they contacted [facility] after hours because the ID office has no after hours contact. She stated that she is going to call [facility] office today for follow up for the medication refill and appreciative of the [facility] assistance.

## 2019-03-25 ENCOUNTER — Other Ambulatory Visit: Payer: Medicare HMO

## 2019-04-02 ENCOUNTER — Telehealth: Payer: Medicare HMO | Admitting: Cardiology

## 2019-04-07 ENCOUNTER — Other Ambulatory Visit: Payer: Self-pay

## 2019-04-07 MED ORDER — TRIUMEQ 600-50-300 MG PO TABS: 1.0000 | ORAL_TABLET | Freq: Every day | ORAL | 1 refills | Status: DC

## 2019-04-07 NOTE — Telephone Encounter (Signed)
Patient called requesting refill. Advised patient that it has been 1+ year since last seen by provider and he will need to make and keep follow up appointment to continue with medication refills.  LPN scheduled patient for provider visit 04/08/19 (patient had labs in July 2020). Electronic refill sent to [facility] with 1 refill.

## 2019-04-08 ENCOUNTER — Ambulatory Visit (INDEPENDENT_AMBULATORY_CARE_PROVIDER_SITE_OTHER): Payer: Medicare HMO | Admitting: Internal Medicine

## 2019-04-08 ENCOUNTER — Other Ambulatory Visit: Payer: Self-pay

## 2019-04-08 ENCOUNTER — Other Ambulatory Visit: Payer: Self-pay | Admitting: Oncology

## 2019-04-08 ENCOUNTER — Encounter: Payer: Self-pay | Admitting: Internal Medicine

## 2019-04-08 VITALS — BP 133/74 | HR 70 | Temp 97.7°F

## 2019-04-08 DIAGNOSIS — N183 Chronic kidney disease, stage 3 unspecified: Secondary | ICD-10-CM

## 2019-04-08 DIAGNOSIS — Z113 Encounter for screening for infections with a predominantly sexual mode of transmission: Secondary | ICD-10-CM

## 2019-04-08 DIAGNOSIS — B20 Human immunodeficiency virus [HIV] disease: Secondary | ICD-10-CM

## 2019-04-08 DIAGNOSIS — Z23 Encounter for immunization: Secondary | ICD-10-CM

## 2019-04-08 DIAGNOSIS — R69 Illness, unspecified: Secondary | ICD-10-CM | POA: Diagnosis not present

## 2019-04-08 MED ORDER — TRIUMEQ 600-50-300 MG PO TABS: 1.0000 | ORAL_TABLET | Freq: Every day | ORAL | 11 refills | Status: DC

## 2019-04-08 NOTE — Assessment & Plan Note (Signed)
Doing well on his current regimen so no changes indicated.  rTC 6 months

## 2019-04-08 NOTE — Assessment & Plan Note (Signed)
Pneumovax 23 due today Flu shot as well

## 2019-04-08 NOTE — Progress Notes (Signed)
   Subjective:    Patient ID: Thomas [name], male    DOB: 11-26-1980, 38 y.o.   MRN: [id number]  HPI Here for follow up of HIV I last saw him 1 year ago but he did have labs 6 months ago and again in July.  CD4 716 and viral load < 20.  Creat stable at 2.10.  No new issues since last year, has remained healthy.  Continues to follow with Dr. Alen Blew for active surveillance for his Hodgkins lymphoma treated in 2015.  No associated weight loss, diarrhea. No missed doses, no complaints today.   Review of Systems  Constitutional: Negative for fatigue.  Gastrointestinal: Negative for diarrhea.  Skin: Negative for rash.       Objective:   Physical Exam Constitutional:      General: He is not in acute distress.    Appearance: He is well-developed.  Eyes:     General: No scleral icterus. Pulmonary:     Effort: Pulmonary effort is normal.  Skin:    Findings: No rash.  Neurological:     General: No focal deficit present.     Mental Status: He is alert.  Psychiatric:        Mood and Affect: Mood normal.    SH: former smoker       Assessment & Plan:

## 2019-04-08 NOTE — Assessment & Plan Note (Signed)
Screened negative 

## 2019-04-08 NOTE — Addendum Note (Signed)
Addended by: Lenore Cordia on: 04/08/2019 01:40 PM   Modules accepted: Orders

## 2019-04-08 NOTE — Assessment & Plan Note (Signed)
Stable creat.  Will continue to monitor

## 2019-04-10 ENCOUNTER — Inpatient Hospital Stay: Payer: Medicare HMO

## 2019-04-10 ENCOUNTER — Telehealth: Payer: Self-pay | Admitting: Oncology

## 2019-04-10 ENCOUNTER — Inpatient Hospital Stay: Payer: Medicare HMO | Admitting: Oncology

## 2019-04-10 ENCOUNTER — Other Ambulatory Visit: Payer: Medicare HMO

## 2019-04-10 NOTE — Telephone Encounter (Signed)
R/s appt per 9/17 sch message - pt aware of appt date and time   

## 2019-04-16 ENCOUNTER — Telehealth: Payer: Medicare HMO | Admitting: Cardiology

## 2019-04-18 ENCOUNTER — Other Ambulatory Visit: Payer: Self-pay | Admitting: *Deleted

## 2019-04-18 ENCOUNTER — Inpatient Hospital Stay: Payer: Medicare HMO | Attending: Oncology

## 2019-04-18 ENCOUNTER — Inpatient Hospital Stay: Payer: Medicare HMO | Admitting: Oncology

## 2019-04-22 ENCOUNTER — Telehealth: Payer: Self-pay | Admitting: Oncology

## 2019-04-22 NOTE — Telephone Encounter (Signed)
Called pt per 9/25 sch message - unable to reach pt . Left message for pt to call back to reschedule appt.

## 2019-04-23 ENCOUNTER — Other Ambulatory Visit: Payer: Medicare HMO

## 2019-05-05 ENCOUNTER — Other Ambulatory Visit: Payer: Medicare HMO

## 2019-05-19 ENCOUNTER — Encounter: Payer: Self-pay | Admitting: Cardiology

## 2019-05-19 ENCOUNTER — Other Ambulatory Visit: Payer: Self-pay

## 2019-05-19 ENCOUNTER — Ambulatory Visit (INDEPENDENT_AMBULATORY_CARE_PROVIDER_SITE_OTHER): Payer: Medicare HMO | Admitting: Cardiology

## 2019-05-19 VITALS — BP 128/86 | HR 72 | Ht 73.0 in | Wt 192.0 lb

## 2019-05-19 DIAGNOSIS — E7800 Pure hypercholesterolemia, unspecified: Secondary | ICD-10-CM | POA: Diagnosis not present

## 2019-05-19 DIAGNOSIS — N1831 Chronic kidney disease, stage 3a: Secondary | ICD-10-CM | POA: Diagnosis not present

## 2019-05-19 DIAGNOSIS — I25118 Atherosclerotic heart disease of native coronary artery with other forms of angina pectoris: Secondary | ICD-10-CM | POA: Diagnosis not present

## 2019-05-19 MED ORDER — ATORVASTATIN CALCIUM 40 MG PO TABS: 40.0000 mg | ORAL_TABLET | Freq: Every day | ORAL | 3 refills | Status: DC

## 2019-05-19 MED ORDER — NITROGLYCERIN 0.4 MG SL SUBL: 0.4000 mg | SUBLINGUAL_TABLET | SUBLINGUAL | 3 refills | Status: DC | PRN

## 2019-05-19 MED ORDER — ASPIRIN EC 81 MG PO TBEC: 81.0000 mg | DELAYED_RELEASE_TABLET | Freq: Every day | ORAL | 3 refills | Status: DC

## 2019-05-19 MED ORDER — METOPROLOL SUCCINATE ER 25 MG PO TB24: 25.0000 mg | ORAL_TABLET | Freq: Every day | ORAL | 3 refills | Status: DC

## 2019-05-19 NOTE — Patient Instructions (Addendum)
Please have your labs done. Orders are in the [place].   Please make appointment to see your Oncologist, Dr. Alen Blew, they are trying to reach you.  I will see you in 6 months.  You need to keep appointment for your stress test and echocardiogram.

## 2019-05-19 NOTE — Progress Notes (Signed)
Primary Physician/Referring:  [name], MD  Patient ID: Thomas [name], male    DOB: 1981/03/04, 38 y.o.   MRN: [id number]  Chief Complaint  Patient presents with  . Chest Pain  . Follow-up    pt did not get tests done;    HPI:    [name]  is a 38 y.o.  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 He was admitted to [facility] on 05/15/2014 in [place]. fib had to be defibrillated 3. Underwent coronary angioplasty on 05/17/2014 for high-grade circumflex coronary artery stenosis.   Patient was admitted to [facility] with non-ST elevation myocardial infarction on 10/27/2015, underwent coronary angiography and was found to have widely patent circumflex marginal stent and balloon angioplasty site at the distal circumflex. However midsegment of the circumflex had a 70% stenosis in the PL branch and a 70% stenosis but was left alone due to severe diffuse nature of the disease and small vessels.  With a past few months, he has noticed chest tightness while he is doing routine activities around the house.  Has also noticed mild dyspnea.  Chest discomfort is easily relieved with nitroglycerin, he has finished all the s/l NTG refills as well since I last saw him 6 months ago.  I have set him up for stress test and echocardiogram, he has missed these appointments.  He has not had any rest pain.  No PND orthopnea.  Past Medical History:  Diagnosis Date  . Acute respiratory failure with hypoxemia: post cardiac arrest 05/19/2014  . Cancer ([place])    hodgkins, no current treatment for  . Cardiac arrest ([place]) 05/15/2014  . Chronic renal insufficiency   . CKD (chronic kidney disease) s/p Right nephrectomy (04/30/2014) 05/19/2014  . CLABSI (central line-associated bloodstream infection) 02/27/2014  . Enteritis  01/22/2014  . History of blood transfusion july 2015 per pt  . HIV disease ([place]) 06/25/2012  . Hydronephrosis, right & hydroureter: secondary to obstructing ureteral calculi 03/01/2014  . Leg pain 07/03/2012  . Nephrolithiasis   . NSTEMI (non-ST elevated myocardial infarction) ([place]) 05/19/2014  . Obstructive uropathy 12/12/2014   Status post left renal stent  . Penile cyst 08/06/2012  . Septic shock ([place]) 01/18/2014  . Shingles rash 02/25/2014  . Sinus tachycardia 08/06/2012  . Skin lesion 07/03/2012  . Staphylococcus aureus bacteremia 02/25/2014  . Transaminitis 05/19/2014   Past Surgical History:  Procedure Laterality Date  . 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  this after several previous I and Ds of rectal abcess.   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 CARDIAC CATHETERIZATION  05/19/2014  . CARDIAC CATHETERIZATION N/A 10/28/2015   Procedure: Left Heart Cath and Coronary Angiography;  Surgeon: Adrian Prows, MD;  Location: [place] [facility];  Service: Cardiovascular;  Laterality: N/A;  . CORONARY STENT PLACEMENT  05/19/2014   OMI   & PROXIMAL CIRCUMFLEX  . CYSTOSCOPY WITH RETROGRADE PYELOGRAM, URETEROSCOPY AND STENT PLACEMENT Left 12/11/2014   Procedure: CYSTOSCOPY WITH RETROGRADE PYELOGRAM, URETEROSCOPY, STONE REMOVAL, AND STENT PLACEMENT;  Surgeon: Kathie Rhodes, MD;  Location: [facility];  Service: Urology;  Laterality: Left;  . HOLMIUM LASER APPLICATION Left [id number]   Procedure: HOLMIUM LASER APPLICATION;  Surgeon: Kathie Rhodes, MD;  Location: [facility];  Service: Urology;  Laterality: Left;  . LAPAROSCOPIC NEPHRECTOMY Right 04/30/2014   Procedure: LAPAROSCOPIC NEPHRECTOMY AND URETERECTOMY;  Surgeon: Raynelle Bring, MD;  Location: [facility];  Service: Urology;  Laterality: Right;  . LEFT HEART CATHETERIZATION WITH CORONARY ANGIOGRAM N/A 05/18/2014   Procedure: LEFT HEART CATHETERIZATION WITH CORONARY ANGIOGRAM;  Surgeon: Laverda Page, MD;   Location: [facility] CATH LAB;  Service: Cardiovascular;  Laterality: N/A;  . pac placed and later removed    . PERCUTANEOUS CORONARY STENT INTERVENTION (PCI-S) N/A 05/19/2014   Procedure: PERCUTANEOUS CORONARY STENT INTERVENTION (PCI-S);  Surgeon: Laverda Page, MD;  Location: [facility] CATH LAB;  Service: Cardiovascular;  Laterality: N/A;  . right nephrostomy tube  for last month and 1/2  . SUPRACLAVICAL NODE BIOPSY Right 08/08/2013   Procedure: SUPRACLAVICULAR LYMPH NODE BIOPSY;  Surgeon: Gaye Pollack, MD;  Location: [facility];  Service: Thoracic;  Laterality: Right;  . TEE WITHOUT CARDIOVERSION N/A 02/27/2014   Procedure: TRANSESOPHAGEAL ECHOCARDIOGRAM (TEE);  Surgeon: Pixie Casino, MD;  Location: [facility] ENDOSCOPY;  Service: Cardiovascular;  Laterality: N/A;   Social History   Socioeconomic History  . Marital status: Single    Spouse name: Not on file  . Number of children: 0  . Years of education: Not on file  . Highest education level: Not on file  Occupational History  . Not on file  Social Needs  . Financial resource strain: Not on file  . Food insecurity    Worry: Not on file    Inability: Not on file  . Transportation needs    Medical: Not on file    Non-medical: Not on file  Tobacco Use  . Smoking status: Former Smoker    Packs/day: 1.00    Years: 15.00    Pack years: 15.00    Types: Cigarettes    Start date: 08/24/2013    Quit date: 09/21/2013    Years since quitting: 5.6  . Smokeless tobacco: Never Used  . Tobacco comment: quitline info, counseling  Substance and Sexual Activity  . Alcohol use: No  . Drug use: No    Types: Marijuana    Comment: hasn't smoked marijuana in "a while".   . Sexual activity: Not Currently    Partners: Female    Birth control/protection: Condom    Comment: declined condoms  Lifestyle  . Physical activity    Days per week: Not on file    Minutes per session: Not on file  . Stress: Not on file  Relationships  . Social Herbalist on  phone: Not on file    Gets together: Not on file    Attends religious service: Not on file    Active member of club or organization: Not on file    Attends meetings of clubs or organizations: Not on file    Relationship status: Not on file  . Intimate partner violence    Fear of current or ex partner: Not on file    Emotionally abused: Not on file    Physically abused: Not on file    Forced sexual activity: Not on file  Other Topics Concern  . Not on file  Social History Narrative  . Not on file   ROS  Review of Systems  Constitution: Negative for chills, decreased appetite, malaise/fatigue and weight gain.  Cardiovascular: Positive for chest pain and dyspnea on exertion. Negative for leg swelling and syncope.  Endocrine: Negative for cold intolerance.  Hematologic/Lymphatic: Does not bruise/bleed easily.  Musculoskeletal: Negative for joint  swelling.  Gastrointestinal: Negative for abdominal pain, anorexia and change in bowel habit.  Neurological: Negative for headaches and light-headedness.  Psychiatric/Behavioral: Negative for depression and substance abuse.  All other systems reviewed and are negative.  Objective   Vitals with BMI 05/19/2019 04/08/2019 12/12/2018  Height 6\' 1"  - 6\' 1"   Weight 192 lbs - 204 lbs  BMI [id number] - [id number]  Systolic 0000000 [id number]  Diastolic 86 74 82  Pulse 72 70 100    Blood pressure 128/86, pulse 72, height 6\' 1"  (1.854 m), weight 192 lb (87.1 kg), SpO2 98 %. Body mass index is 25.33 kg/m.   Physical Exam  Constitutional: He appears well-developed and well-nourished. No distress.  HENT:  Head: Atraumatic.  Eyes: Conjunctivae are normal.  Neck: Neck supple. No JVD present. No thyromegaly present.  Cardiovascular: Normal rate, regular rhythm, normal heart sounds and intact distal pulses. Exam reveals no gallop.  No murmur heard. Pulmonary/Chest: Effort normal and breath sounds normal.  Abdominal: Soft. Bowel sounds are normal.  Musculoskeletal:  Normal range of motion.  Neurological: He is alert.  Skin: Skin is warm and dry.  Psychiatric: He has a normal mood and affect.    Laboratory examination:   Recent Labs    12/03/18 1330 02/17/19 0933  NA 142 140  K 4.5 4.1  CL 105 109  CO2 22 24  GLUCOSE 84 96  BUN 22* 15  CREATININE 1.98* 2.10*  CALCIUM 9.4 8.7  GFRNONAA 43* 40*  GFRAA 49* 46*   CMP Latest Ref Rng & Units 02/17/2019 12/03/2018 03/13/2018  Glucose 65 - 99 mg/dL 96 84 91  BUN 7 - 25 mg/dL 15 22(H) 18  Creatinine 0.60 - 1.35 mg/dL 2.10(H) 1.98(H) 1.93(H)  Sodium 135 - 146 mmol/L 140 142 142  Potassium 3.5 - 5.3 mmol/L 4.1 4.5 3.9  Chloride 98 - 110 mmol/L 109 105 108  CO2 20 - 32 mmol/L 24 22 26   Calcium 8.6 - 10.3 mg/dL 8.7 9.4 9.2  Total Protein 6.1 - 8.1 g/dL 7.0 7.2 7.8  Total Bilirubin 0.2 - 1.2 mg/dL 0.3 0.3 0.3  Alkaline Phos 39 - 117 IU/L - 57 61  AST 10 - 40 U/L 34 24 15  ALT 9 - 46 U/L 29 27 16    CBC Latest Ref Rng & Units 02/17/2019 12/03/2018 03/13/2018  WBC 3.8 - 10.8 Thousand/uL 4.1 4.1 5.2  Hemoglobin 13.2 - 17.1 g/dL 15.0 15.5 16.0  Hematocrit 38.5 - 50.0 % 42.6 43.4 47.1  Platelets 140 - 400 Thousand/uL 166 177 127(L)   Lipid Panel     Component Value Date/Time   CHOL 207 (H) 02/17/2019 0933   CHOL 223 (H) 12/03/2018 1330   TRIG 175 (H) 02/17/2019 0933   HDL 38 (L) 02/17/2019 0933   HDL 37 (L) 12/03/2018 1330   CHOLHDL 5.4 (H) 02/17/2019 0933   VLDL 12 10/28/2015 0509   LDLCALC 138 (H) 02/17/2019 0933   HEMOGLOBIN A1C Lab Results  Component Value Date   HGBA1C 5.3 12/10/2014   MPG 105 12/10/2014   TSH No results for input(s): TSH in the last 8760 hours. Medications and allergies   Allergies  Allergen Reactions  . Tylenol [Acetaminophen] Other (See Comments)    sweats     Prior to Admission medications   Medication Sig Start Date End Date Taking? Authorizing Provider  abacavir-dolutegravir-lamiVUDine (TRIUMEQ) 600-50-300 MG tablet Take 1 tablet by mouth daily. 04/08/19   Yes Comer, Okey Regal, MD  aspirin EC 81 MG  tablet Take 1 tablet (81 mg total) by mouth daily. 05/19/19  Yes Adrian Prows, MD  atorvastatin (LIPITOR) 40 MG tablet Take 1 tablet (40 mg total) by mouth daily. 05/19/19  Yes Adrian Prows, MD  metoprolol succinate (TOPROL-XL) 25 MG 24 hr tablet Take 1 tablet (25 mg total) by mouth daily. Take with or immediately following a meal. 05/19/19 08/17/19 Yes Adrian Prows, MD  nitroGLYCERIN (NITROSTAT) 0.4 MG SL tablet Place 1 tablet (0.4 mg total) under the tongue every 5 (five) minutes as needed for up to 25 days for chest pain. 05/19/19 06/13/19 Yes Adrian Prows, MD     Current Outpatient Medications  Medication Instructions  . abacavir-dolutegravir-lamiVUDine (TRIUMEQ) 600-50-300 MG tablet 1 tablet, Oral, Daily  . aspirin EC 81 mg, Oral, Daily  . atorvastatin (LIPITOR) 40 mg, Oral, Daily  . metoprolol succinate (TOPROL-XL) 25 mg, Oral, Daily, Take with or immediately following a meal.  . nitroGLYCERIN (NITROSTAT) 0.4 mg, Sublingual, Every 5 min PRN    Radiology:  No results found.   Cardiac Studies:   Echo 05/17/2014: Essentially normal echo with moderate pulmonary hypertension. No wall motion abnormality Impression: EKG 02/28/2017: Normal sinus rhythm at rate of 63 bpm, normal axis. No evidence of ischemia. Early R-wave progression in septal leads, cannot exclude posterior infarct old. (lead placement). No evidence of ischemia. Normal QT interval.  Coronary angiogram 10/27/2015 widely patent circumflex marginal stent and balloon angioplasty site at the distal circumflex performed 05/19/14. However midsegment of the circumflex had a 70% stenosis in the PL branch and a 70% stenosis but was left alone due to severe diffuse nature of the disease and small vessels.  Assessment     ICD-10-CM   1. Coronary artery disease of native artery of native heart with stable angina pectoris (HCC)  I25.118 EKG 12-Lead    nitroGLYCERIN (NITROSTAT) 0.4 MG SL tablet     metoprolol succinate (TOPROL-XL) 25 MG 24 hr tablet    aspirin EC 81 MG tablet  2. Stage 3a chronic kidney disease  N18.31    s/p Right nephrectomy (04/30/2014)  3. Pure hypercholesterolemia  E78.00 atorvastatin (LIPITOR) 40 MG tablet    Lipid Panel With LDL/HDL Ratio    EKG 05/19/2019: Normal sinus rhythm with rate of 66 bpm, LAE, , normal axis, poor R wave progression, cannot exclude anteroseptal infarct old.  No evidence of ischemia. No significant change from   EKG 12/12/2018   Recommendations:   RADAMES KARI  is a 38 y.o.  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 He was admitted to [facility] on 05/15/2014 in [place]. fib had to be defibrillated 3. Underwent coronary angioplasty on 05/17/2014 for high-grade circumflex coronary artery stenosis. Patient's symptoms are worrisome for progressive angina pectoris.  Coronary angiogram in 2017 for non-STEMI activated 70% stenosis in circumflex but was left alone due to diffuse disease.  He has noticed much more frequent episodes of chest tightness now and he has been using sublingual nitroglycerin.  On his last office visit 6 months ago I had set him up for stress test and echocardiogram which he missed appointments.  He has also missed several of the appointments with his PCP as well as oncology.  I have encouraged him to keep the appointments.  Advised him to obtain labs especially lipid profile testing, on his last office visit I  started him on all his medications which he is now taking appropriately on a regular basis.  I have refilled them to improve compliance for a whole year.  Advised him and also encouraged him to make all the appointments including stress testing and echocardiogram.  Unless he is having more symptoms, I will see him back in 6 months. EKG 05/19/2019:  Adrian Prows, MD, [facility] 05/19/2019, 12:37 PM [facility] [place] Pager: [phone] Office: [phone] If no answer Cell [phone]

## 2019-06-09 ENCOUNTER — Ambulatory Visit (INDEPENDENT_AMBULATORY_CARE_PROVIDER_SITE_OTHER): Payer: Medicare HMO

## 2019-06-09 ENCOUNTER — Other Ambulatory Visit: Payer: Self-pay

## 2019-06-09 DIAGNOSIS — I25118 Atherosclerotic heart disease of native coronary artery with other forms of angina pectoris: Secondary | ICD-10-CM | POA: Diagnosis not present

## 2019-06-23 ENCOUNTER — Other Ambulatory Visit: Payer: Medicare HMO

## 2019-06-30 ENCOUNTER — Ambulatory Visit: Payer: Medicare HMO

## 2019-07-30 ENCOUNTER — Telehealth: Payer: Self-pay

## 2019-08-06 ENCOUNTER — Ambulatory Visit (INDEPENDENT_AMBULATORY_CARE_PROVIDER_SITE_OTHER): Payer: Medicare HMO

## 2019-08-06 ENCOUNTER — Other Ambulatory Visit: Payer: Self-pay

## 2019-08-06 DIAGNOSIS — I25118 Atherosclerotic heart disease of native coronary artery with other forms of angina pectoris: Secondary | ICD-10-CM | POA: Diagnosis not present

## 2019-09-01 ENCOUNTER — Ambulatory Visit: Payer: Medicare HMO | Admitting: Cardiology

## 2019-10-06 ENCOUNTER — Other Ambulatory Visit: Payer: Self-pay

## 2019-10-06 ENCOUNTER — Other Ambulatory Visit: Payer: Medicare HMO

## 2019-10-06 DIAGNOSIS — B20 Human immunodeficiency virus [HIV] disease: Secondary | ICD-10-CM

## 2019-10-06 DIAGNOSIS — R69 Illness, unspecified: Secondary | ICD-10-CM | POA: Diagnosis not present

## 2019-10-07 LAB — T-HELPER CELL (CD4) - (RCID CLINIC ONLY)
CD4 % Helper T Cell: 44 % (ref 33–65)
CD4 T Cell Abs: 741 /uL (ref 400–1790)

## 2019-10-08 LAB — HIV-1 RNA QUANT-NO REFLEX-BLD
HIV 1 RNA Quant: <20 copies/mL
HIV-1 RNA Quant, Log: <1.3 Log copies/mL

## 2019-10-22 ENCOUNTER — Encounter: Payer: Medicare HMO | Admitting: Internal Medicine

## 2019-10-27 ENCOUNTER — Encounter: Payer: Medicare HMO | Admitting: Internal Medicine

## 2019-11-14 NOTE — Progress Notes (Deleted)
Primary Physician/Referring:  [name], MD  Patient ID: Thomas [name], male    DOB: 22-Aug-1980, 39 y.o.   MRN: [id number]  No chief complaint on file.  HPI:    Thomas [name]  is a 39 y.o. 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 In 05/15/2014 had V. fib arrest andhad to be defibrillated 3. Underwent coronary angioplasty on 05/17/2014 for high-grade circumflex coronary artery stenosis. Coronary angiogram in 2017 for non-STEMI activated 70% stenosis in circumflex but was left alone due to diffuse disease.  With a past few months, he has noticed chest tightness while he is doing routine activities around the house.  Has also noticed mild dyspnea.  Chest discomfort is easily relieved with nitroglycerin, he has finished all the s/l NTG refills as well since I last saw him 6 months ago.  I have set him up for stress test and echocardiogram, he has missed these appointments.  He has not had any rest pain.  No PND orthopnea.   Past Medical History:  Diagnosis Date  . Acute respiratory failure with hypoxemia: post cardiac arrest 05/19/2014  . Cancer ([place])    hodgkins, no current treatment for  . Cardiac arrest ([place]) 05/15/2014  . Chronic renal insufficiency   . CKD (chronic kidney disease) s/p Right nephrectomy (04/30/2014) 05/19/2014  . CLABSI (central line-associated bloodstream infection) 02/27/2014  . Enteritis 01/22/2014  . History of blood transfusion july 2015 per pt  . HIV disease ([place]) 06/25/2012  . Hydronephrosis, right & hydroureter: secondary to obstructing ureteral calculi 03/01/2014  . Leg pain 07/03/2012  . Nephrolithiasis   . NSTEMI (non-ST elevated myocardial infarction) ([place]) 05/19/2014  . Obstructive uropathy 12/12/2014   Status post left renal stent  . Penile cyst 08/06/2012  .  Septic shock ([place]) 01/18/2014  . Shingles rash 02/25/2014  . Sinus tachycardia 08/06/2012  . Skin lesion 07/03/2012  . Staphylococcus aureus bacteremia 02/25/2014  . Transaminitis 05/19/2014   Past Surgical History:  Procedure Laterality Date  . 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  this after several previous I and Ds of rectal abcess.   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 CARDIAC CATHETERIZATION  05/19/2014  . CARDIAC CATHETERIZATION N/A 10/28/2015   Procedure: Left Heart Cath and Coronary Angiography;  Surgeon: Adrian Prows, MD;  Location: [facility];  Service: Cardiovascular;  Laterality: N/A;  . CORONARY STENT PLACEMENT  05/19/2014   OMI   & PROXIMAL CIRCUMFLEX  . CYSTOSCOPY WITH RETROGRADE PYELOGRAM, URETEROSCOPY AND STENT PLACEMENT Left 12/11/2014   Procedure: CYSTOSCOPY WITH RETROGRADE PYELOGRAM, URETEROSCOPY, STONE REMOVAL, AND STENT PLACEMENT;  Surgeon: Kathie Rhodes, MD;  Location: [facility];  Service: Urology;  Laterality: Left;  . HOLMIUM LASER APPLICATION Left [id number]   Procedure: HOLMIUM LASER APPLICATION;  Surgeon: Kathie Rhodes, MD;  Location: [facility];  Service: Urology;  Laterality: Left;  . LAPAROSCOPIC NEPHRECTOMY Right 04/30/2014   Procedure: LAPAROSCOPIC NEPHRECTOMY AND URETERECTOMY;  Surgeon: Raynelle Bring, MD;  Location: [facility];  Service: Urology;  Laterality: Right;  . LEFT HEART CATHETERIZATION WITH CORONARY ANGIOGRAM N/A 05/18/2014   Procedure: LEFT HEART CATHETERIZATION WITH CORONARY ANGIOGRAM;  Surgeon: Laverda Page, MD;  Location: [facility];  Service: Cardiovascular;  Laterality: N/A;  . pac placed and later removed    .  PERCUTANEOUS CORONARY STENT INTERVENTION (PCI-S) N/A 05/19/2014   Procedure: PERCUTANEOUS CORONARY STENT INTERVENTION (PCI-S);  Surgeon: Laverda Page, MD;  Location: [facility] C[facility] LAB;  Service: Cardiovascular;  Laterality: N/A;  . right nephrostomy tube  for last month and 1/2  .  SUPRACLAVICAL NODE BIOPSY Right 08/08/2013   Procedure: SUPRACLAVICULAR LYMPH NODE BIOPSY;  Surgeon: Gaye Pollack, MD;  Location: [facility];  Service: Thoracic;  Laterality: Right;  . TEE WITHOUT CARDIOVERSION N/A 02/27/2014   Procedure: TRANSESOPHAGEAL ECHOCARDIOGRAM (TEE);  Surgeon: Pixie Casino, MD;  Location: [facility];  Service: Cardiovascular;  Laterality: N/A;   Family History  Problem Relation Age of Onset  . Diabetes Mother     Social History   Tobacco Use  . Smoking status: Former Smoker    Packs/day: 1.00    Years: 15.00    Pack years: 15.00    Types: Cigarettes    Start date: 08/24/2013    Quit date: 09/21/2013    Years since quitting: 6.1  . Smokeless tobacco: Never Used  . Tobacco comment: quitline info, counseling  Substance Use Topics  . Alcohol use: No   Marital Status: Single  ROS  ***Review of Systems  Constitution: Negative for chills, decreased appetite, malaise/fatigue and weight gain.  Cardiovascular: Positive for chest pain and dyspnea on exertion.  Endocrine: Negative for cold intolerance.  Hematologic/Lymphatic: Does not bruise/bleed easily.  Musculoskeletal: Negative for joint swelling.  Gastrointestinal: Negative for abdominal pain, anorexia and change in bowel habit.  Neurological: Negative for headaches and light-headedness.  Psychiatric/Behavioral: Negative for depression and substance abuse.   Objective  There were no vitals taken for this visit.  Vitals with BMI 05/19/2019 04/08/2019 12/12/2018  Height 6\' 1"  - 6\' 1"   Weight 192 lbs - 204 lbs  BMI [id number]  Systolic 0000000 [id number]  Diastolic 86 74 82  Pulse 72 70 100     ***Physical Exam  Constitutional: He appears well-developed and well-nourished. No distress.  HENT:  Head: Atraumatic.  Eyes: Conjunctivae are normal.  Neck: No JVD present. No thyromegaly present.  Cardiovascular: Normal rate, regular rhythm, normal heart sounds and intact distal pulses. Exam reveals no gallop.  No  murmur heard. Musculoskeletal:        General: Normal range of motion.  Neurological: He is alert.  Skin: Skin is warm and dry.  Psychiatric: He has a normal mood and affect.   Laboratory examination:   Recent Labs    12/03/18 1330 02/17/19 0933  NA 142 140  K 4.5 4.1  CL 105 109  CO2 22 24  GLUCOSE 84 96  BUN 22* 15  CREATININE 1.98* 2.10*  CALCIUM 9.4 8.7  GFRNONAA 43* 40*  GFRAA 49* 46*   CrCl cannot be calculated (Patient's most recent lab result is older than the maximum 21 days allowed.).  CMP Latest Ref Rng & Units 02/17/2019 12/03/2018 03/13/2018  Glucose 65 - 99 mg/dL 96 84 91  BUN 7 - 25 mg/dL 15 22(H) 18  Creatinine 0.60 - 1.35 mg/dL 2.10(H) 1.98(H) 1.93(H)  Sodium 135 - 146 mmol/L 140 142 142  Potassium 3.5 - 5.3 mmol/L 4.1 4.5 3.9  Chloride 98 - 110 mmol/L 109 105 108  CO2 20 - 32 mmol/L 24 22 26   Calcium 8.6 - 10.3 mg/dL 8.7 9.4 9.2  Total Protein 6.1 - 8.1 g/dL 7.0 7.2 7.8  Total Bilirubin 0.2 - 1.2 mg/dL 0.3 0.3 0.3  Alkaline Phos 39 - 117 IU/L - 57 61  AST 10 - 40 U/L 34 24 15  ALT 9 - 46 U/L 29 27 16    CBC Latest Ref Rng & Units 02/17/2019 12/03/2018 03/13/2018  WBC 3.8 - 10.8 Thousand/uL 4.1 4.1 5.2  Hemoglobin 13.2 - 17.1 g/dL 15.0 15.5 16.0  Hematocrit 38.5 - 50.0 % 42.6 43.4 47.1  Platelets 140 - 400 Thousand/uL 166 177 127(L)   Lipid Panel     Component Value Date/Time   CHOL 207 (H) 02/17/2019 0933   CHOL 223 (H) 12/03/2018 1330   TRIG 175 (H) 02/17/2019 0933   HDL 38 (L) 02/17/2019 0933   HDL 37 (L) 12/03/2018 1330   CHOLHDL 5.4 (H) 02/17/2019 0933   VLDL 12 10/28/2015 0509   LDLCALC 138 (H) 02/17/2019 0933   HEMOGLOBIN A1C Lab Results  Component Value Date   HGBA1C 5.3 12/10/2014   MPG 105 12/10/2014   TSH No results for input(s): TSH in the last 8760 hours.  External labs:   ***  Medications and allergies   Allergies  Allergen Reactions  . Tylenol [Acetaminophen] Other (See Comments)    sweats     Current Outpatient  Medications  Medication Instructions  . abacavir-dolutegravir-lamiVUDine (TRIUMEQ) 600-50-300 MG tablet 1 tablet, Oral, Daily  . aspirin EC 81 mg, Oral, Daily  . atorvastatin (LIPITOR) 40 mg, Oral, Daily  . metoprolol succinate (TOPROL-XL) 25 mg, Oral, Daily, Take with or immediately following a meal.  . nitroGLYCERIN (NITROSTAT) 0.4 mg, Sublingual, Every 5 min PRN   Radiology:   No results found.  Cardiac Studies:   Coronary angiogram 10/27/2015 widely patent circumflex marginal stent and balloon angioplasty site at the distal circumflex performed 05/19/14. However midsegment of the circumflex had a 70% stenosis in the PL branch and a 70% stenosis but was left alone due to severe diffuse nature of the disease and small vessels.  Echocardiogram 06/09/2019:  Left ventricle cavity is normal in size. Mild concentric hypertrophy of the left ventricle. Normal LV systolic function with visual EF 50-55%.  Normal global wall motion. Normal diastolic filling pattern.  Mild to moderate mitral regurgitation.  Mild tricuspid regurgitation.  No evidence of pulmonary hypertension.   Lexiscan (Walking with mod Bruce)Tetrofosmin Stress Test  08/06/2019: Nondiagnostic ECG stress. There is a moderate sized  reversible defect in the anterior, septal and apical regions extending from the mid ventricle to apex.  Overall function is abnormal with regional wall motion abnormalities, anterior wall hypokinesis. Stress LV EF: 51%.  No previous exam available for comparison. Intermediate to high risk study.   EKG  ***   *** 05/19/2019: Normal sinus rhythm with rate of 66 bpm, LAE, , normal axis, poor R wave progression, cannot exclude anteroseptal infarct old.  No evidence of ischemia. No significant change from EKG 12/12/2018  Assessment     ICD-10-CM   1. Coronary artery disease of native artery of native heart with stable angina pectoris ([place])  I25.118   2. Stage 3a chronic kidney disease  N18.31   3.  Pure hypercholesterolemia  E78.00      No orders of the defined types were placed in this encounter.   There are no discontinued medications.  Recommendations:   Thomas [name]  is a 39 y.o. 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 In 05/15/2014 had V. fib arrest andhad to be defibrillated 3. Underwent  coronary angioplasty on 05/17/2014 for high-grade circumflex coronary artery stenosis. Coronary angiogram in 2017 for non-STEMI activated 70% stenosis in circumflex but was left alone due to diffuse disease.  He has noticed much more frequent episodes of chest tightness now and he has been using sublingual nitroglycerin.  On his last office visit 6 months ago I had set him up for stress test and echocardiogram which he missed appointments.  He has also missed several of the appointments with his PCP as well as oncology.  I have encouraged him to keep the appointments.  Advised him to obtain labs especially lipid profile testing, on his last office visit I started him on all his medications which he is now taking appropriately on a regular basis.  I have refilled them to improve compliance for a whole year.  Advised him and also encouraged him to make all the appointments including stress testing and echocardiogram.  Unless he is having more symptoms, I will see him back in 6 months.   Adrian Prows, MD, [facility] 11/17/2019, 6:26 AM [facility]. [place] Pager: [phone] Office: [phone] If no answer Cell [phone]

## 2019-11-17 ENCOUNTER — Ambulatory Visit: Payer: Medicare HMO | Admitting: Cardiology

## 2020-03-24 ENCOUNTER — Other Ambulatory Visit: Payer: Self-pay | Admitting: Internal Medicine

## 2020-03-24 ENCOUNTER — Other Ambulatory Visit: Payer: Self-pay | Admitting: Cardiology

## 2020-03-24 DIAGNOSIS — I25118 Atherosclerotic heart disease of native coronary artery with other forms of angina pectoris: Secondary | ICD-10-CM

## 2020-03-24 DIAGNOSIS — E7800 Pure hypercholesterolemia, unspecified: Secondary | ICD-10-CM

## 2020-05-15 ENCOUNTER — Encounter (HOSPITAL_COMMUNITY): Payer: Self-pay | Admitting: Emergency Medicine

## 2020-05-15 ENCOUNTER — Emergency Department (HOSPITAL_COMMUNITY)
Admission: EM | Admit: 2020-05-15 | Discharge: 2020-05-16 | Discharge status: Against Medical Advice | Payer: Medicare HMO | Attending: Emergency Medicine | Admitting: Emergency Medicine

## 2020-05-15 ENCOUNTER — Other Ambulatory Visit: Payer: Self-pay

## 2020-05-15 DIAGNOSIS — N189 Chronic kidney disease, unspecified: Secondary | ICD-10-CM | POA: Diagnosis not present

## 2020-05-15 DIAGNOSIS — Z8571 Personal history of Hodgkin lymphoma: Secondary | ICD-10-CM | POA: Insufficient documentation

## 2020-05-15 DIAGNOSIS — Z87891 Personal history of nicotine dependence: Secondary | ICD-10-CM | POA: Diagnosis not present

## 2020-05-15 DIAGNOSIS — R10819 Abdominal tenderness, unspecified site: Secondary | ICD-10-CM | POA: Diagnosis not present

## 2020-05-15 DIAGNOSIS — R519 Headache, unspecified: Secondary | ICD-10-CM | POA: Insufficient documentation

## 2020-05-15 DIAGNOSIS — Y9389 Activity, other specified: Secondary | ICD-10-CM | POA: Insufficient documentation

## 2020-05-15 DIAGNOSIS — R109 Unspecified abdominal pain: Secondary | ICD-10-CM

## 2020-05-15 DIAGNOSIS — S3991XA Unspecified injury of abdomen, initial encounter: Secondary | ICD-10-CM | POA: Diagnosis not present

## 2020-05-15 DIAGNOSIS — Z79899 Other long term (current) drug therapy: Secondary | ICD-10-CM | POA: Insufficient documentation

## 2020-05-15 DIAGNOSIS — M25562 Pain in left knee: Secondary | ICD-10-CM | POA: Diagnosis not present

## 2020-05-15 DIAGNOSIS — Z7982 Long term (current) use of aspirin: Secondary | ICD-10-CM | POA: Insufficient documentation

## 2020-05-15 DIAGNOSIS — R52 Pain, unspecified: Secondary | ICD-10-CM | POA: Diagnosis not present

## 2020-05-15 DIAGNOSIS — Z7901 Long term (current) use of anticoagulants: Secondary | ICD-10-CM | POA: Insufficient documentation

## 2020-05-15 DIAGNOSIS — Y999 Unspecified external cause status: Secondary | ICD-10-CM | POA: Insufficient documentation

## 2020-05-15 DIAGNOSIS — S299XXA Unspecified injury of thorax, initial encounter: Secondary | ICD-10-CM | POA: Diagnosis not present

## 2020-05-15 DIAGNOSIS — Z041 Encounter for examination and observation following transport accident: Secondary | ICD-10-CM | POA: Diagnosis not present

## 2020-05-15 DIAGNOSIS — Y92410 Unspecified street and highway as the place of occurrence of the external cause: Secondary | ICD-10-CM | POA: Insufficient documentation
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
## 2020-05-16 ENCOUNTER — Encounter (HOSPITAL_COMMUNITY): Payer: Self-pay | Admitting: Emergency Medicine

## 2020-05-16 ENCOUNTER — Emergency Department (HOSPITAL_COMMUNITY): Payer: Medicare HMO

## 2020-05-16 ENCOUNTER — Other Ambulatory Visit: Payer: Self-pay

## 2020-05-16 ENCOUNTER — Emergency Department (HOSPITAL_COMMUNITY)
Admission: EM | Admit: 2020-05-16 | Discharge: 2020-05-16 | Disposition: A | Discharge status: Home / Self Care | Payer: Medicare HMO | Source: Home / Self Care | Attending: Emergency Medicine | Admitting: Emergency Medicine

## 2020-05-16 DIAGNOSIS — N189 Chronic kidney disease, unspecified: Secondary | ICD-10-CM | POA: Insufficient documentation

## 2020-05-16 DIAGNOSIS — S80912A Unspecified superficial injury of left knee, initial encounter: Secondary | ICD-10-CM | POA: Diagnosis not present

## 2020-05-16 DIAGNOSIS — B20 Human immunodeficiency virus [HIV] disease: Secondary | ICD-10-CM | POA: Insufficient documentation

## 2020-05-16 DIAGNOSIS — S299XXA Unspecified injury of thorax, initial encounter: Secondary | ICD-10-CM | POA: Diagnosis not present

## 2020-05-16 DIAGNOSIS — Z87891 Personal history of nicotine dependence: Secondary | ICD-10-CM | POA: Insufficient documentation

## 2020-05-16 DIAGNOSIS — Z7982 Long term (current) use of aspirin: Secondary | ICD-10-CM | POA: Insufficient documentation

## 2020-05-16 DIAGNOSIS — R519 Headache, unspecified: Secondary | ICD-10-CM | POA: Diagnosis not present

## 2020-05-16 DIAGNOSIS — Z041 Encounter for examination and observation following transport accident: Secondary | ICD-10-CM | POA: Diagnosis not present

## 2020-05-16 DIAGNOSIS — Z79899 Other long term (current) drug therapy: Secondary | ICD-10-CM | POA: Insufficient documentation

## 2020-05-16 DIAGNOSIS — S3991XA Unspecified injury of abdomen, initial encounter: Secondary | ICD-10-CM | POA: Diagnosis not present

## 2020-05-16 DIAGNOSIS — M25562 Pain in left knee: Secondary | ICD-10-CM | POA: Diagnosis not present

## 2020-05-16 LAB — BASIC METABOLIC PANEL
Anion gap: 13 (ref 5–15)
BUN: 25 mg/dL — ABNORMAL HIGH (ref 6–20)
CO2: 21 mmol/L — ABNORMAL LOW (ref 22–32)
Calcium: 9.2 mg/dL (ref 8.9–10.3)
Chloride: 107 mmol/L (ref 98–111)
Creatinine, Ser: 2.16 mg/dL — ABNORMAL HIGH (ref 0.61–1.24)
GFR, Estimated: 39 mL/min — ABNORMAL LOW (ref >60)
Glucose, Bld: 92 mg/dL (ref 70–99)
Potassium: 3.8 mmol/L (ref 3.5–5.1)
Sodium: 141 mmol/L (ref 135–145)

## 2020-05-16 LAB — CBC
HCT: 42 % (ref 39.0–52.0)
Hemoglobin: 14.3 g/dL (ref 13.0–17.0)
MCH: 36.4 pg — ABNORMAL HIGH (ref 26.0–34.0)
MCHC: 34 g/dL (ref 30.0–36.0)
MCV: 106.9 fL — ABNORMAL HIGH (ref 80.0–100.0)
Platelets: 185 10*3/uL (ref 150–400)
RBC: 3.93 MIL/uL — ABNORMAL LOW (ref 4.22–5.81)
RDW: 11.9 % (ref 11.5–15.5)
WBC: 5.5 10*3/uL (ref 4.0–10.5)
nRBC: 0 % (ref 0.0–0.2)

## 2020-05-16 MED ORDER — CYCLOBENZAPRINE HCL 10 MG PO TABS: 10.0000 mg | ORAL_TABLET | Freq: Two times a day (BID) | ORAL | 0 refills | Status: DC | PRN

## 2020-05-16 MED ORDER — CYCLOBENZAPRINE HCL 10 MG PO TABS
10.0000 mg | ORAL_TABLET | Freq: Once | ORAL | Status: AC
  Administered 2020-05-16: 10 mg via ORAL
  Filled 2020-05-16: qty 1

## 2020-05-16 NOTE — ED Notes (Signed)
Patient transported to CT 

## 2020-05-16 NOTE — Progress Notes (Signed)
Orthopedic Tech Progress Note Patient Details:  Thomas [name] 12-17-1980 [id number]  Ortho Devices Ortho Device/Splint Location: applied knee immobilizer to LLE Ortho Device/Splint Interventions: Ordered, Application, Adjustment   Post Interventions Patient Tolerated: Well Instructions Provided: Care of device   Braulio Bosch 05/16/2020, 9:14 PM

## 2020-05-16 NOTE — ED Notes (Signed)
Ortho tech bedside
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
## 2020-05-16 NOTE — ED Notes (Signed)
Pt did not want to stay for test results.

## 2020-05-16 NOTE — ED Notes (Signed)
An After Visit Summary was printed and given to the patient. Discharge instructions given and no further questions at this time.  
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
## 2020-07-03 ENCOUNTER — Other Ambulatory Visit: Payer: Self-pay | Admitting: Internal Medicine

## 2020-07-03 DIAGNOSIS — B20 Human immunodeficiency virus [HIV] disease: Secondary | ICD-10-CM

## 2020-07-03 MED ORDER — ABACAVIR-DOLUTEGRAVIR-LAMIVUD 600-50-300 MG PO TABS: 1.0000 | ORAL_TABLET | Freq: Every day | ORAL | 0 refills | Status: DC

## 2020-07-04 MED ORDER — ABACAVIR-DOLUTEGRAVIR-LAMIVUD 600-50-300 MG PO TABS: 1.0000 | ORAL_TABLET | Freq: Every day | ORAL | 0 refills | Status: DC

## 2020-07-04 MED ORDER — ABACAVIR-DOLUTEGRAVIR-LAMIVUD 600-50-300 MG PO TABS: 1.0000 | ORAL_TABLET | Freq: Every day | ORAL | 11 refills | Status: DC

## 2020-07-31 ENCOUNTER — Other Ambulatory Visit: Payer: Self-pay | Admitting: Internal Medicine

## 2020-08-03 ENCOUNTER — Telehealth: Payer: Self-pay | Admitting: *Deleted

## 2020-08-03 DIAGNOSIS — B20 Human immunodeficiency virus [HIV] disease: Secondary | ICD-10-CM

## 2020-08-03 MED ORDER — ABACAVIR-DOLUTEGRAVIR-LAMIVUD 600-50-300 MG PO TABS: 1.0000 | ORAL_TABLET | Freq: Every day | ORAL | 0 refills | Status: DC
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
## 2020-08-03 NOTE — Telephone Encounter (Signed)
Yes, thanks

## 2020-08-10 ENCOUNTER — Ambulatory Visit: Payer: Medicare HMO | Admitting: Internal Medicine

## 2020-08-13 ENCOUNTER — Telehealth: Payer: Self-pay | Admitting: Internal Medicine

## 2020-08-13 NOTE — Telephone Encounter (Signed)
°   MYCHAL DECARLO DOB: 1980-09-27 MRN: [id number]   RIDER WAIVER AND RELEASE OF LIABILITY  For purposes of improving physical access to our facilities, [facility] is pleased to partner with third parties to provide [facility] patients or other authorized individuals the option of convenient, on-demand ground transportation services (the [employer]) through use of the technology service that enables users to request on-demand ground transportation from independent third-party providers.  By opting to use and accept these [employer], I, the undersigned, hereby agree on behalf of myself, and on behalf of any minor child using the [employer] for whom I am the parent or legal guardian, as follows:  1. Government social research officer provided to me are provided by independent third-party transportation providers who are not [employer] or employees and who are unaffiliated with [employer]. 2. [facility] is neither a transportation carrier nor a common or public carrier. 3. [facility] has no control over the quality or safety of the transportation that occurs as a result of the [employer]. 4. [facility] cannot guarantee that any third-party transportation provider will complete any arranged transportation service. 5. [facility] makes no representation, warranty, or guarantee regarding the reliability, timeliness, quality, safety, suitability, or availability of any of the Transport Services or that they will be error free. 6. I fully understand that traveling by vehicle involves risks and dangers of serious bodily injury, including permanent disability, paralysis, and death. I agree, on behalf of myself and on behalf of any minor child using the Transport Services for whom I am the parent or legal guardian, that the entire risk arising out of my use of the [employer] remains solely with me, to the maximum extent permitted under applicable law. 7. The [employer] are provided as is and as available. [facility] disclaims all representations and warranties, express, implied or statutory, not expressly set out in these terms, including the implied warranties of merchantability and fitness for a particular purpose. 8. I hereby waive and release [facility], its agents, employees, officers, directors, representatives, insurers, attorneys, assigns, successors, subsidiaries, and affiliates from any and all past, present, or future claims, demands, liabilities, actions, causes of action, or suits of any kind directly or indirectly arising from acceptance and use of the [employer]. 9. I further waive and release [facility] and its affiliates from all present and future liability and responsibility for any injury or death to persons or damages to property caused by or related to the use of the [employer]. 10. I have read this Waiver and Release of Liability, and I understand the terms used in it and their legal significance. This Waiver is freely and voluntarily given with the understanding that my right (as well as the right of any minor child for whom I am the parent or legal guardian using the [employer]) to legal recourse against [facility] in connection with the [employer] is knowingly surrendered in return for use of these services.   I attest that I read the consent document to [name], gave Mr. Chevez the opportunity to ask questions and answered the questions asked (if any). I affirm that [name] then provided consent for he's participation in this program.     Darrick Meigs [place]

## 2020-08-16 ENCOUNTER — Encounter: Payer: Self-pay | Admitting: Internal Medicine

## 2020-08-16 ENCOUNTER — Other Ambulatory Visit: Payer: Self-pay

## 2020-08-16 ENCOUNTER — Other Ambulatory Visit (HOSPITAL_COMMUNITY)
Admission: RE | Admit: 2020-08-16 | Discharge: 2020-08-16 | Disposition: A | Discharge status: Home / Self Care | Payer: Medicare HMO | Source: Ambulatory Visit | Attending: Internal Medicine | Admitting: Internal Medicine

## 2020-08-16 ENCOUNTER — Ambulatory Visit (INDEPENDENT_AMBULATORY_CARE_PROVIDER_SITE_OTHER): Payer: Medicare HMO | Admitting: Internal Medicine

## 2020-08-16 VITALS — BP 125/78 | HR 83 | Temp 98.0°F | Resp 16 | Ht 73.0 in | Wt 199.6 lb

## 2020-08-16 DIAGNOSIS — Z5181 Encounter for therapeutic drug level monitoring: Secondary | ICD-10-CM | POA: Diagnosis not present

## 2020-08-16 DIAGNOSIS — B20 Human immunodeficiency virus [HIV] disease: Secondary | ICD-10-CM | POA: Insufficient documentation

## 2020-08-16 DIAGNOSIS — Z79899 Other long term (current) drug therapy: Secondary | ICD-10-CM | POA: Diagnosis not present

## 2020-08-16 DIAGNOSIS — Z23 Encounter for immunization: Secondary | ICD-10-CM | POA: Diagnosis not present

## 2020-08-16 DIAGNOSIS — Z113 Encounter for screening for infections with a predominantly sexual mode of transmission: Secondary | ICD-10-CM

## 2020-08-16 DIAGNOSIS — R69 Illness, unspecified: Secondary | ICD-10-CM | POA: Diagnosis not present

## 2020-08-16 NOTE — Assessment & Plan Note (Signed)
Will check his lipid panel 

## 2020-08-16 NOTE — Assessment & Plan Note (Signed)
He continues to do very well from this standpoint and no concerns.  Will check labs today and if no issues, can rtc in 6 months.
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
## 2020-08-16 NOTE — Assessment & Plan Note (Signed)
Will screen 

## 2020-08-16 NOTE — Assessment & Plan Note (Signed)
Will recheck his creat, LFTs

## 2020-08-16 NOTE — Patient Instructions (Signed)
Influenza (Flu) Vaccine (Inactivated or Recombinant): What You Need to Know 1. Why get vaccinated? Influenza vaccine can prevent influenza (flu). Flu is a contagious disease that spreads around the [place] every year, usually between October and May. Anyone can get the flu, but it is more dangerous for some people. Infants and young children, people 65 years and older, pregnant people, and people with certain health conditions or a weakened immune system are at greatest risk of flu complications. Pneumonia, bronchitis, sinus infections, and ear infections are examples of flu-related complications. If you have a medical condition, such as heart disease, cancer, or diabetes, flu can make it worse. Flu can cause fever and chills, sore throat, muscle aches, fatigue, cough, headache, and runny or stuffy nose. Some people may have vomiting and diarrhea, though this is more common in children than adults. In an average year, thousands of people in the [place] die from flu, and many more are hospitalized. Flu vaccine prevents millions of illnesses and flu-related visits to the doctor each year. 2. Influenza vaccines CDC recommends everyone 6 months and older get vaccinated every flu season. Children 6 months through 8 years of age may need 2 doses during a single flu season. Everyone else needs only 1 dose each flu season. It takes about 2 weeks for protection to develop after vaccination. There are many flu viruses, and they are always changing. Each year a new flu vaccine is made to protect against the influenza viruses believed to be likely to cause disease in the upcoming flu season. Even when the vaccine doesn't exactly match these viruses, it may still provide some protection. Influenza vaccine does not cause flu. Influenza vaccine may be given at the same time as other vaccines. 3. Talk with your health care provider Tell your vaccination provider if the person getting the vaccine:  Has  had an allergic reaction after a previous dose of influenza vaccine, or has any severe, life-threatening allergies  Has ever had Guillain-Barr Syndrome (also called "GBS") In some cases, your health care provider may decide to postpone influenza vaccination until a future visit. Influenza vaccine can be administered at any time during pregnancy. People who are or will be pregnant during influenza season should receive inactivated influenza vaccine. People with minor illnesses, such as a cold, may be vaccinated. People who are moderately or severely ill should usually wait until they recover before getting influenza vaccine. Your health care provider can give you more information. 4. Risks of a vaccine reaction  Soreness, redness, and swelling where the shot is given, fever, muscle aches, and headache can happen after influenza vaccination.  There may be a very small increased risk of Guillain-Barr Syndrome (GBS) after inactivated influenza vaccine (the flu shot). Young children who get the flu shot along with pneumococcal vaccine (PCV13) and/or DTaP vaccine at the same time might be slightly more likely to have a seizure caused by fever. Tell your health care provider if a child who is getting flu vaccine has ever had a seizure. People sometimes faint after medical procedures, including vaccination. Tell your provider if you feel dizzy or have vision changes or ringing in the ears. As with any medicine, there is a very remote chance of a vaccine causing a severe allergic reaction, other serious injury, or death. 5. What if there is a serious problem? An allergic reaction could occur after the vaccinated person leaves the clinic. If you see signs of a severe allergic reaction (hives, swelling of the face   and throat, difficulty breathing, a fast heartbeat, dizziness, or weakness), call 9-1-1 and get the person to the nearest hospital. For other signs that concern you, call your health care  provider. Adverse reactions should be reported to the Vaccine Adverse Event Reporting System (VAERS). Your health care provider will usually file this report, or you can do it yourself. Visit the VAERS website at www.vaers.hhs.gov or call 1-800-822-7967. VAERS is only for reporting reactions, and VAERS staff members do not give medical advice. 6. The National Vaccine Injury Compensation Program The National Vaccine Injury Compensation Program (VICP) is a federal program that was created to compensate people who may have been injured by certain vaccines. Claims regarding alleged injury or death due to vaccination have a time limit for filing, which may be as short as two years. Visit the VICP website at www.hrsa.gov/vaccinecompensation or call 1-800-338-2382 to learn about the program and about filing a claim. 7. How can I learn more?  Ask your health care provider.  Call your local or state health department.  Visit the website of the Food and Drug Administration (FDA) for vaccine package inserts and additional information at www.fda.gov/vaccines-blood-biologics/vaccines.  Contact the Centers for Disease Control and Prevention (CDC): ? Call 1-800-232-4636 (1-800-CDC-INFO) or ? Visit CDC's website at www.cdc.gov/flu. Vaccine Information Statement Inactivated Influenza Vaccine (02/27/2020) This information is not intended to replace advice given to you by your health care provider. Make sure you discuss any questions you have with your health care provider. Document Revised: 04/15/2020 Document Reviewed: 04/15/2020 Elsevier Patient Education  2021 Elsevier Inc.  

## 2020-08-17 LAB — T-HELPER CELL (CD4) - (RCID CLINIC ONLY)
CD4 % Helper T Cell: 40 % (ref 33–65)
CD4 T Cell Abs: 740 /uL (ref 400–1790)

## 2020-08-18 LAB — CBC WITH DIFFERENTIAL/PLATELET
Absolute Monocytes: 433 cells/uL (ref 200–950)
Basophils Absolute: 23 cells/uL (ref 0–200)
Basophils Relative: 0.4 %
Eosinophils Absolute: 51 cells/uL (ref 15–500)
Eosinophils Relative: 0.9 %
HCT: 45 % (ref 38.5–50.0)
Hemoglobin: 16.1 g/dL (ref 13.2–17.1)
Lymphs Abs: 1961 cells/uL (ref 850–3900)
MCH: 36.7 pg — ABNORMAL HIGH (ref 27.0–33.0)
MCHC: 35.8 g/dL (ref 32.0–36.0)
MCV: 102.5 fL — ABNORMAL HIGH (ref 80.0–100.0)
MPV: 11 fL (ref 7.5–12.5)
Monocytes Relative: 7.6 %
Neutro Abs: 3232 cells/uL (ref 1500–7800)
Neutrophils Relative %: 56.7 %
Platelets: 178 10*3/uL (ref 140–400)
RBC: 4.39 10*6/uL (ref 4.20–5.80)
RDW: 12.2 % (ref 11.0–15.0)
Total Lymphocyte: 34.4 %
WBC: 5.7 10*3/uL (ref 3.8–10.8)

## 2020-08-18 LAB — COMPLETE METABOLIC PANEL WITH GFR
AG Ratio: 1.7 (calc) (ref 1.0–2.5)
ALT: 24 U/L (ref 9–46)
AST: 17 U/L (ref 10–40)
Albumin: 4.8 g/dL (ref 3.6–5.1)
Alkaline phosphatase (APISO): 70 U/L (ref 36–130)
BUN/Creatinine Ratio: 13 (calc) (ref 6–22)
BUN: 30 mg/dL — ABNORMAL HIGH (ref 7–25)
CO2: 25 mmol/L (ref 20–32)
Calcium: 9.5 mg/dL (ref 8.6–10.3)
Chloride: 107 mmol/L (ref 98–110)
Creat: 2.24 mg/dL — ABNORMAL HIGH (ref 0.60–1.35)
GFR, Est African American: 42 mL/min/{1.73_m2} — ABNORMAL LOW (ref >=60)
GFR, Est Non African American: 36 mL/min/{1.73_m2} — ABNORMAL LOW (ref >=60)
Globulin: 2.8 g/dL (calc) (ref 1.9–3.7)
Glucose, Bld: 64 mg/dL — ABNORMAL LOW (ref 65–99)
Potassium: 4.3 mmol/L (ref 3.5–5.3)
Sodium: 141 mmol/L (ref 135–146)
Total Bilirubin: 0.3 mg/dL (ref 0.2–1.2)
Total Protein: 7.6 g/dL (ref 6.1–8.1)

## 2020-08-18 LAB — LIPID PANEL
Cholesterol: 166 mg/dL (ref <200)
HDL: 51 mg/dL (ref >=40)
LDL Cholesterol (Calc): 95 mg/dL (calc)
Non-HDL Cholesterol (Calc): 115 mg/dL (calc) (ref <130)
Total CHOL/HDL Ratio: 3.3 (calc) (ref <5.0)
Triglycerides: 103 mg/dL (ref <150)

## 2020-08-18 LAB — URINE CYTOLOGY ANCILLARY ONLY
Chlamydia: NEGATIVE
Comment: NEGATIVE
Comment: NORMAL
Neisseria Gonorrhea: NEGATIVE

## 2020-08-18 LAB — HIV-1 RNA QUANT-NO REFLEX-BLD
HIV 1 RNA Quant: <20 Copies/mL
HIV-1 RNA Quant, Log: <1.3 Log cps/mL

## 2020-08-18 LAB — RPR: RPR Ser Ql: NONREACTIVE

## 2020-12-24 ENCOUNTER — Other Ambulatory Visit: Payer: Self-pay | Admitting: Cardiology

## 2020-12-24 DIAGNOSIS — I25118 Atherosclerotic heart disease of native coronary artery with other forms of angina pectoris: Secondary | ICD-10-CM

## 2020-12-24 DIAGNOSIS — E7800 Pure hypercholesterolemia, unspecified: Secondary | ICD-10-CM

## 2021-02-15 ENCOUNTER — Telehealth: Payer: Self-pay | Admitting: *Deleted

## 2021-02-15 ENCOUNTER — Ambulatory Visit: Payer: Medicare HMO | Admitting: Internal Medicine

## 2021-02-15 NOTE — Telephone Encounter (Signed)
Called patient to see if he was on his way to today's visit; would offer virtual visit if he was available. Call went to voicemail. RN left message asking patient to reschedule today's missed visit. Landis Gandy, RN

## 2021-02-15 NOTE — Telephone Encounter (Signed)
[facility] desk contacted transportation office. Per transportation, a driver went out to pick him up and waited, but he didn't show up so the ride was cancelled. Landis Gandy

## 2021-02-18 ENCOUNTER — Other Ambulatory Visit: Payer: Self-pay | Admitting: Cardiology

## 2021-02-18 DIAGNOSIS — I25118 Atherosclerotic heart disease of native coronary artery with other forms of angina pectoris: Secondary | ICD-10-CM

## 2021-05-09 ENCOUNTER — Other Ambulatory Visit: Payer: Self-pay

## 2021-05-09 ENCOUNTER — Encounter: Payer: Self-pay | Admitting: Internal Medicine

## 2021-05-09 ENCOUNTER — Ambulatory Visit (INDEPENDENT_AMBULATORY_CARE_PROVIDER_SITE_OTHER): Payer: Medicare HMO | Admitting: Internal Medicine

## 2021-05-09 VITALS — BP 142/101 | HR 91 | Temp 97.6°F | Wt 179.0 lb

## 2021-05-09 DIAGNOSIS — B20 Human immunodeficiency virus [HIV] disease: Secondary | ICD-10-CM

## 2021-05-09 DIAGNOSIS — R69 Illness, unspecified: Secondary | ICD-10-CM | POA: Diagnosis not present

## 2021-05-09 DIAGNOSIS — Z23 Encounter for immunization: Secondary | ICD-10-CM | POA: Diagnosis not present

## 2021-05-09 MED ORDER — ABACAVIR-DOLUTEGRAVIR-LAMIVUD 600-50-300 MG PO TABS: 1.0000 | ORAL_TABLET | Freq: Every day | ORAL | 11 refills | Status: DC

## 2021-05-09 NOTE — Progress Notes (Signed)
   Subjective:    Patient ID: Thomas [name], male    DOB: [birth year], 40 y.o.   MRN: [id number]  HPI Here for follow up of HIV He continues on Triumeq with no missed doses. No complaints and no new issues.  No labs prior to the visit.    Review of Systems  Constitutional:  Negative for fatigue.  Gastrointestinal:  Negative for diarrhea and nausea.  Skin:  Negative for rash.      Objective:   Physical Exam Eyes:     General: No scleral icterus. Neurological:     General: No focal deficit present.     Mental Status: He is alert.  Psychiatric:        Mood and Affect: Mood normal.          Assessment & Plan:

## 2021-05-09 NOTE — Assessment & Plan Note (Addendum)
He continues to do well on Triumeq and no concerns.  Discussed monkeypox and vaccine and is a candidate.  Will put him on the vaccine list for when we have more vaccine supply.  Will continue and rtc in 6 months.

## 2021-05-09 NOTE — Assessment & Plan Note (Signed)
Discussed flu shot and given today 

## 2021-05-10 LAB — T-HELPER CELL (CD4) - (RCID CLINIC ONLY)
CD4 % Helper T Cell: 48 % (ref 33–65)
CD4 T Cell Abs: 918 /uL (ref 400–1790)

## 2021-05-10 LAB — HIV-1 RNA QUANT-NO REFLEX-BLD
HIV 1 RNA Quant: NOT DETECTED Copies/mL
HIV-1 RNA Quant, Log: NOT DETECTED Log cps/mL

## 2021-05-30 ENCOUNTER — Other Ambulatory Visit: Payer: Self-pay | Admitting: Cardiology

## 2021-05-30 DIAGNOSIS — I25118 Atherosclerotic heart disease of native coronary artery with other forms of angina pectoris: Secondary | ICD-10-CM

## 2021-08-20 ENCOUNTER — Other Ambulatory Visit: Payer: Self-pay

## 2021-08-20 ENCOUNTER — Emergency Department (HOSPITAL_COMMUNITY)
Admission: EM | Admit: 2021-08-20 | Discharge: 2021-08-20 | Disposition: A | Discharge status: Home / Self Care | Payer: Medicare HMO | Attending: Emergency Medicine | Admitting: Emergency Medicine

## 2021-08-20 DIAGNOSIS — Z7982 Long term (current) use of aspirin: Secondary | ICD-10-CM | POA: Insufficient documentation

## 2021-08-20 DIAGNOSIS — G4700 Insomnia, unspecified: Secondary | ICD-10-CM | POA: Insufficient documentation

## 2021-08-20 DIAGNOSIS — R69 Illness, unspecified: Secondary | ICD-10-CM | POA: Diagnosis not present

## 2021-08-20 DIAGNOSIS — Z21 Asymptomatic human immunodeficiency virus [HIV] infection status: Secondary | ICD-10-CM | POA: Insufficient documentation

## 2021-08-20 DIAGNOSIS — R630 Anorexia: Secondary | ICD-10-CM | POA: Diagnosis not present

## 2021-08-20 DIAGNOSIS — R5381 Other malaise: Secondary | ICD-10-CM | POA: Insufficient documentation

## 2021-08-20 LAB — CBC WITH DIFFERENTIAL/PLATELET
Abs Immature Granulocytes: 0 10*3/uL (ref 0.00–0.07)
Basophils Absolute: 0 10*3/uL (ref 0.0–0.1)
Basophils Relative: 0 %
Eosinophils Absolute: 0.1 10*3/uL (ref 0.0–0.5)
Eosinophils Relative: 2 %
HCT: 38.2 % — ABNORMAL LOW (ref 39.0–52.0)
Hemoglobin: 12.8 g/dL — ABNORMAL LOW (ref 13.0–17.0)
Immature Granulocytes: 0 %
Lymphocytes Relative: 28 %
Lymphs Abs: 1.1 10*3/uL (ref 0.7–4.0)
MCH: 34.5 pg — ABNORMAL HIGH (ref 26.0–34.0)
MCHC: 33.5 g/dL (ref 30.0–36.0)
MCV: 103 fL — ABNORMAL HIGH (ref 80.0–100.0)
Monocytes Absolute: 0.4 10*3/uL (ref 0.1–1.0)
Monocytes Relative: 10 %
Neutro Abs: 2.3 10*3/uL (ref 1.7–7.7)
Neutrophils Relative %: 60 %
Platelets: 251 10*3/uL (ref 150–400)
RBC: 3.71 MIL/uL — ABNORMAL LOW (ref 4.22–5.81)
RDW: 11.4 % — ABNORMAL LOW (ref 11.5–15.5)
WBC: 3.9 10*3/uL — ABNORMAL LOW (ref 4.0–10.5)
nRBC: 0 % (ref 0.0–0.2)

## 2021-08-20 LAB — COMPREHENSIVE METABOLIC PANEL
ALT: 16 U/L (ref 0–44)
AST: 16 U/L (ref 15–41)
Albumin: 3.4 g/dL — ABNORMAL LOW (ref 3.5–5.0)
Alkaline Phosphatase: 52 U/L (ref 38–126)
Anion gap: 7 (ref 5–15)
BUN: 15 mg/dL (ref 6–20)
CO2: 25 mmol/L (ref 22–32)
Calcium: 8.7 mg/dL — ABNORMAL LOW (ref 8.9–10.3)
Chloride: 105 mmol/L (ref 98–111)
Creatinine, Ser: 1.87 mg/dL — ABNORMAL HIGH (ref 0.61–1.24)
GFR, Estimated: 47 mL/min — ABNORMAL LOW (ref >60)
Glucose, Bld: 88 mg/dL (ref 70–99)
Potassium: 3.8 mmol/L (ref 3.5–5.1)
Sodium: 137 mmol/L (ref 135–145)
Total Bilirubin: 0.6 mg/dL (ref 0.3–1.2)
Total Protein: 8.6 g/dL — ABNORMAL HIGH (ref 6.5–8.1)

## 2021-08-20 NOTE — Discharge Instructions (Signed)
Return for any problem.  ?

## 2021-08-20 NOTE — ED Triage Notes (Signed)
Patient reports he has had loss of appetite and insomnia x 1 week. Says he has been trying to fill up with nyquil but not working. Patient says he would like a little check up. Denies pain in triage.

## 2021-08-20 NOTE — ED Provider Notes (Signed)
[facility] DEPT Provider Note   CSN: [id number] Arrival date & time: 08/20/21  1332     History  Chief Complaint  Patient presents with   Anorexia    Thomas [name] is a 41 y.o. male.  41 year old male with prior medical history as detailed below presents for evaluation.  Patient reports that over the last week he is noted a decrease in interest in eating.  He reports that he is lost his appetite.  He also reports difficulty sleeping at night.  He denies fever.  He denies chest pain or shortness of breath.  Denies abdominal pain.  He denies diarrhea.  He denies emesis.  He denies nausea.  Patient is reporting full compliance with previously prescribed medications for HIV.  He is well-known to Dr. Linus Salmons with [facility].  He reports that he is due for a annual checkup.  The history is provided by the patient, a relative and medical records.  Illness Location:  Malaise, fatigue, insomnia, mild anorexia Severity:  Mild Onset quality:  Gradual Duration:  1 week Timing:  Intermittent Progression:  Waxing and waning Chronicity:  New     Home Medications Prior to Admission medications   Medication Sig Start Date End Date Taking? Authorizing Provider  abacavir-dolutegravir-lamiVUDine (TRIUMEQ) 600-50-300 MG tablet Take 1 tablet by mouth daily. 05/09/21   [name], MD  ASPIR-LOW 81 MG EC tablet TAKE ONE TABLET BY MOUTH DAILY 02/18/21   Adrian Prows, MD  atorvastatin (LIPITOR) 40 MG tablet TAKE ONE TABLET BY MOUTH DAILY 03/24/20   Adrian Prows, MD  cyclobenzaprine (FLEXERIL) 10 MG tablet Take 1 tablet (10 mg total) by mouth 2 (two) times daily as needed for muscle spasms. 05/16/20   Truddie Hidden, MD  metoprolol succinate (TOPROL-XL) 25 MG 24 hr tablet TAKE ONE TABLET BY MOUTH DAILY WITH OR IMMEDIATELY FOLLOWING A MEAL 02/18/21   Adrian Prows, MD  nitroGLYCERIN (NITROSTAT) 0.4 MG SL tablet Place 1 tablet (0.4 mg total) under the tongue every 5 (five) minutes  as needed for up to 25 days for chest pain. 05/19/19 05/16/20  Adrian Prows, MD      Allergies    Tylenol [acetaminophen]    Review of Systems   Review of Systems  All other systems reviewed and are negative.  Physical Exam Updated Vital Signs BP (!) 115/91 (BP Location: Left Arm)    Pulse 94    Temp 98.3 F (36.8 C) (Oral)    Resp 18    Ht 6\' 2"  (1.88 m)    Wt 84.8 kg    SpO2 99%    BMI 24.01 kg/m  Physical Exam Vitals and nursing note reviewed.  Constitutional:      General: He is not in acute distress.    Appearance: Normal appearance. He is well-developed and normal weight.  HENT:     Head: Normocephalic and atraumatic.     Right Ear: Tympanic membrane and ear canal normal.     Left Ear: Tympanic membrane and ear canal normal.     Nose: Nose normal.     Mouth/Throat:     Mouth: Mucous membranes are moist.     Pharynx: Oropharynx is clear. No oropharyngeal exudate or posterior oropharyngeal erythema.  Eyes:     Conjunctiva/sclera: Conjunctivae normal.     Pupils: Pupils are equal, round, and reactive to light.  Cardiovascular:     Rate and Rhythm: Normal rate and regular rhythm.     Heart sounds: Normal  heart sounds.  Pulmonary:     Effort: Pulmonary effort is normal. No respiratory distress.     Breath sounds: Normal breath sounds.  Abdominal:     General: Abdomen is flat. There is no distension.     Palpations: Abdomen is soft.     Tenderness: There is no abdominal tenderness.  Musculoskeletal:        General: No deformity. Normal range of motion.     Cervical back: Normal range of motion and neck supple.  Skin:    General: Skin is warm and dry.  Neurological:     General: No focal deficit present.     Mental Status: He is alert and oriented to person, place, and time. Mental status is at baseline.    ED Results / Procedures / Treatments   Labs (all labs ordered are listed, but only abnormal results are displayed) Labs Reviewed  COMPREHENSIVE METABOLIC PANEL  - Abnormal; Notable for the following components:      Result Value   Creatinine, Ser 1.87 (*)    Calcium 8.7 (*)    Total Protein 8.6 (*)    Albumin 3.4 (*)    GFR, Estimated 47 (*)    All other components within normal limits  CBC WITH DIFFERENTIAL/PLATELET - Abnormal; Notable for the following components:   WBC 3.9 (*)    RBC 3.71 (*)    Hemoglobin 12.8 (*)    HCT 38.2 (*)    MCV 103.0 (*)    MCH 34.5 (*)    RDW 11.4 (*)    All other components within normal limits    EKG None  Radiology No results found.  Procedures Procedures    Medications Ordered in ED Medications - No data to display  ED Course/ Medical Decision Making/ A&P                           Medical Decision Making Amount and/or Complexity of Data Reviewed Labs: ordered.    Medical Screen Complete  This patient presented to the ED with complaint of malaise, fatigue, mild anorexia, mild insomnia.  This complaint involves an extensive number of treatment options. The initial differential diagnosis includes, but is not limited to, infectious process, metabolic abnormality, etc.  This presentation is: Acute, Self-Limited, Previously Undiagnosed, Uncertain Prognosis, Complicated, Systemic Symptoms, and Threat to Life/Bodily Function  Patient presented with complaint of malaise, fatigue, mild anorexia, and mild insomnia.  Requested screening labs are without significant abnormality.  Patient is known to [facility] and is planning on following up closely with them.  Patient appears appropriate for close outpatient follow-up.  Strict return precautions given understood.  Co morbidities that complicated the patient's evaluation  HIV   Additional history obtained:  Additional [facility] obtained [facility] External records from outside sources obtained and reviewed including prior ED visits and prior Inpatient records.    Lab Tests:  I ordered and personally interpreted labs.  The pertinent results  include:  cbc cmp     Problem List / ED Course:  Malaise, insomnia, anorexia   Reevaluation:  After the interventions noted above, I reevaluated the patient and found that they have: stayed the same     Disposition:  After consideration of the diagnostic results and the patients response to treatment, I feel that the patent would benefit from close outpatient follow up.          Final Clinical Impression(s) / ED Diagnoses Final diagnoses:  Insomnia, unspecified type  Anorexia    Rx / DC Orders ED Discharge Orders     None         Valarie Merino, MD 08/20/21 1702

## 2021-11-08 ENCOUNTER — Ambulatory Visit: Payer: Medicare HMO | Admitting: Internal Medicine

## 2022-06-12 ENCOUNTER — Other Ambulatory Visit: Payer: Self-pay | Admitting: Internal Medicine

## 2022-06-12 DIAGNOSIS — B20 Human immunodeficiency virus [HIV] disease: Secondary | ICD-10-CM

## 2022-07-21 ENCOUNTER — Other Ambulatory Visit: Payer: Self-pay | Admitting: Internal Medicine

## 2022-07-21 DIAGNOSIS — B20 Human immunodeficiency virus [HIV] disease: Secondary | ICD-10-CM

## 2022-07-25 ENCOUNTER — Other Ambulatory Visit: Payer: Self-pay

## 2022-07-25 DIAGNOSIS — B20 Human immunodeficiency virus [HIV] disease: Secondary | ICD-10-CM

## 2022-07-25 MED ORDER — TRIUMEQ 600-50-300 MG PO TABS: 1.0000 | ORAL_TABLET | Freq: Every day | ORAL | 0 refills | Status: DC

## 2022-07-25 NOTE — Telephone Encounter (Signed)
Has been over a year since patient was seen. Phone number not in service /no active mychart. Will advise pharmacypatient must contact office in order to get refills so we can update contact information and schedule an appointment in order to fill.

## 2022-08-04 ENCOUNTER — Ambulatory Visit: Payer: Medicare HMO | Admitting: Internal Medicine

## 2022-08-30 ENCOUNTER — Other Ambulatory Visit: Payer: Self-pay

## 2022-08-30 DIAGNOSIS — B20 Human immunodeficiency virus [HIV] disease: Secondary | ICD-10-CM

## 2022-08-30 MED ORDER — TRIUMEQ 600-50-300 MG PO TABS: 1.0000 | ORAL_TABLET | Freq: Every day | ORAL | 0 refills | Status: DC

## 2022-09-21 ENCOUNTER — Encounter: Payer: Self-pay | Admitting: Internal Medicine

## 2022-09-21 ENCOUNTER — Other Ambulatory Visit: Payer: Self-pay

## 2022-09-21 ENCOUNTER — Other Ambulatory Visit (HOSPITAL_COMMUNITY)
Admission: RE | Admit: 2022-09-21 | Discharge: 2022-09-21 | Disposition: A | Discharge status: Home / Self Care | Payer: 59 | Source: Ambulatory Visit | Attending: Internal Medicine | Admitting: Internal Medicine

## 2022-09-21 ENCOUNTER — Ambulatory Visit (INDEPENDENT_AMBULATORY_CARE_PROVIDER_SITE_OTHER): Payer: 59 | Admitting: Internal Medicine

## 2022-09-21 VITALS — BP 149/99 | HR 82 | Temp 98.2°F | Ht 73.0 in | Wt 207.0 lb

## 2022-09-21 DIAGNOSIS — Z113 Encounter for screening for infections with a predominantly sexual mode of transmission: Secondary | ICD-10-CM | POA: Diagnosis present

## 2022-09-21 DIAGNOSIS — B20 Human immunodeficiency virus [HIV] disease: Secondary | ICD-10-CM

## 2022-09-21 DIAGNOSIS — Z79899 Other long term (current) drug therapy: Secondary | ICD-10-CM | POA: Diagnosis not present

## 2022-09-21 DIAGNOSIS — Z23 Encounter for immunization: Secondary | ICD-10-CM

## 2022-09-21 NOTE — Assessment & Plan Note (Signed)
He continues to do well.  Will check labs today and he will continue with Triumeq. Follow up in 6 months.

## 2022-09-21 NOTE — Progress Notes (Signed)
   Subjective:    Patient ID: Thomas [name], male    DOB: 12/26/1980, 42 y.o.   MRN: [id number]  HPI Thomas [name] is here for follow up of HIV He continues on Triumeq with no missed doses. He has not issues getting or taking his medication.  No complaints today.     Review of Systems  Constitutional:  Negative for fatigue.  Gastrointestinal:  Negative for diarrhea.  Skin:  Negative for rash.       Objective:   Physical Exam Eyes:     General: No scleral icterus. Pulmonary:     Effort: Pulmonary effort is normal.  Neurological:     Mental Status: He is alert.   SH: no tobacco        Assessment & Plan:

## 2022-09-21 NOTE — Progress Notes (Signed)
Patient declined  [facility] for urine/rectal labs Decline anal PAP during this visit.  Tolerated flu and covid vaccines well. Eugenia Mcalpine, LPN

## 2022-09-21 NOTE — Assessment & Plan Note (Signed)
Flu and COVID shot discussed and given today

## 2022-09-21 NOTE — Assessment & Plan Note (Signed)
Will check his lipid panel

## 2022-09-21 NOTE — Assessment & Plan Note (Signed)
Will screen.  Also pap swab

## 2022-09-21 NOTE — Addendum Note (Signed)
Addended by: Caffie Pinto on: 09/21/2022 10:30 AM   Modules accepted: Orders

## 2022-09-22 LAB — CYTOLOGY, (ORAL, ANAL, URETHRAL) ANCILLARY ONLY
Chlamydia: NEGATIVE
Comment: NEGATIVE
Comment: NORMAL
Neisseria Gonorrhea: NEGATIVE

## 2022-09-22 LAB — T-HELPER CELL (CD4) - (RCID CLINIC ONLY)
CD4 % Helper T Cell: 43 % (ref 33–65)
CD4 T Cell Abs: 942 /uL (ref 400–1790)

## 2022-09-23 LAB — CBC WITH DIFFERENTIAL/PLATELET
Absolute Monocytes: 330 cells/uL (ref 200–950)
Basophils Absolute: 39 cells/uL (ref 0–200)
Basophils Relative: 0.7 %
Eosinophils Absolute: 72 cells/uL (ref 15–500)
Eosinophils Relative: 1.3 %
HCT: 41.5 % (ref 38.5–50.0)
Hemoglobin: 14.6 g/dL (ref 13.2–17.1)
Lymphs Abs: 2508 cells/uL (ref 850–3900)
MCH: 35 pg — ABNORMAL HIGH (ref 27.0–33.0)
MCHC: 35.2 g/dL (ref 32.0–36.0)
MCV: 99.5 fL (ref 80.0–100.0)
MPV: 10.9 fL (ref 7.5–12.5)
Monocytes Relative: 6 %
Neutro Abs: 2552 cells/uL (ref 1500–7800)
Neutrophils Relative %: 46.4 %
Platelets: 199 10*3/uL (ref 140–400)
RBC: 4.17 10*6/uL — ABNORMAL LOW (ref 4.20–5.80)
RDW: 12.7 % (ref 11.0–15.0)
Total Lymphocyte: 45.6 %
WBC: 5.5 10*3/uL (ref 3.8–10.8)

## 2022-09-23 LAB — LIPID PANEL
Cholesterol: 209 mg/dL — ABNORMAL HIGH (ref <200)
HDL: 47 mg/dL (ref >=40)
LDL Cholesterol (Calc): 128 mg/dL (calc) — ABNORMAL HIGH
Non-HDL Cholesterol (Calc): 162 mg/dL (calc) — ABNORMAL HIGH (ref <130)
Total CHOL/HDL Ratio: 4.4 (calc) (ref <5.0)
Triglycerides: 205 mg/dL — ABNORMAL HIGH (ref <150)

## 2022-09-23 LAB — HIV-1 RNA QUANT-NO REFLEX-BLD
HIV 1 RNA Quant: NOT DETECTED Copies/mL
HIV-1 RNA Quant, Log: NOT DETECTED Log cps/mL

## 2022-09-23 LAB — COMPLETE METABOLIC PANEL WITH GFR
AG Ratio: 1.2 (calc) (ref 1.0–2.5)
ALT: 13 U/L (ref 9–46)
AST: 14 U/L (ref 10–40)
Albumin: 4.2 g/dL (ref 3.6–5.1)
Alkaline phosphatase (APISO): 65 U/L (ref 36–130)
BUN/Creatinine Ratio: 9 (calc) (ref 6–22)
BUN: 16 mg/dL (ref 7–25)
CO2: 29 mmol/L (ref 20–32)
Calcium: 9.3 mg/dL (ref 8.6–10.3)
Chloride: 105 mmol/L (ref 98–110)
Creat: 1.78 mg/dL — ABNORMAL HIGH (ref 0.60–1.26)
Globulin: 3.4 g/dL (calc) (ref 1.9–3.7)
Glucose, Bld: 85 mg/dL (ref 65–99)
Potassium: 4.2 mmol/L (ref 3.5–5.3)
Sodium: 140 mmol/L (ref 135–146)
Total Bilirubin: 0.3 mg/dL (ref 0.2–1.2)
Total Protein: 7.6 g/dL (ref 6.1–8.1)
eGFR: 49 mL/min/{1.73_m2} — ABNORMAL LOW (ref >=60)

## 2022-09-23 LAB — RPR TITER: RPR Titer: 1:32 {titer} — ABNORMAL HIGH

## 2022-09-23 LAB — T PALLIDUM AB: T Pallidum Abs: POSITIVE — AB

## 2022-09-23 LAB — RPR: RPR Ser Ql: REACTIVE — AB

## 2022-09-25 ENCOUNTER — Telehealth: Payer: Self-pay

## 2022-09-25 NOTE — Telephone Encounter (Signed)
Left voicemail asking patient to return my call.   Yousef Huge P Mellanie Bejarano, CMA  

## 2022-09-25 NOTE — Telephone Encounter (Signed)
-----   Message from [name], MD sent at 09/23/2022 10:40 AM EST ----- Let him know he is positive for syphillis and needs bicillin 2.4 million units x 1

## 2022-09-27 NOTE — Telephone Encounter (Signed)
Left  HIPAA compliant voice message to return call. Eugenia Mcalpine, LPN

## 2022-09-29 NOTE — Telephone Encounter (Signed)
Multiple attempts to reach patient, unable to contact. Letter mailed to address on file and referral sent to [facility] to assist with notification.   Beryle Flock, RN

## 2022-10-04 ENCOUNTER — Other Ambulatory Visit: Payer: Self-pay

## 2022-10-04 ENCOUNTER — Other Ambulatory Visit: Payer: Self-pay | Admitting: Internal Medicine

## 2022-10-04 DIAGNOSIS — B20 Human immunodeficiency virus [HIV] disease: Secondary | ICD-10-CM

## 2022-10-04 MED ORDER — TRIUMEQ 600-50-300 MG PO TABS: 1.0000 | ORAL_TABLET | Freq: Every day | ORAL | 5 refills | Status: DC

## 2022-10-04 NOTE — Telephone Encounter (Signed)
Patient called to request refill on Triumeq. I also asked patient if he had been treated for syphilis per last RPR results. Patient stated he has not been treated and no one from [facility] has contacted him. Patient scheduled now to come in an see our office for treatment.  Alieah Brinton T Brooks Sailors

## 2022-10-05 ENCOUNTER — Ambulatory Visit (INDEPENDENT_AMBULATORY_CARE_PROVIDER_SITE_OTHER): Payer: 59

## 2022-10-05 ENCOUNTER — Other Ambulatory Visit: Payer: Self-pay

## 2022-10-05 DIAGNOSIS — A539 Syphilis, unspecified: Secondary | ICD-10-CM | POA: Diagnosis not present

## 2022-10-05 MED ORDER — PENICILLIN G BENZATHINE 1200000 UNIT/2ML IM SUSY
1.2000 10*6.[IU] | PREFILLED_SYRINGE | Freq: Once | INTRAMUSCULAR | Status: AC
  Administered 2022-10-05: 1.2 10*6.[IU] via INTRAMUSCULAR

## 2022-10-05 NOTE — Progress Notes (Addendum)
Treatment plan was discussed with the RN.  I have personally reviewed the clinical findings, labs, imaging studies and management of this patient in detail.  I agree with the documentation, as recorded by the RN.    Reviewed and verified allergies with patient. Patient tolerated Bicillin injections well. Reinforced abstinence until treatment completed, offered condoms and encouraged use. Advised patient to notify sexual partners for testing and treatment. Patient verbalized understanding.   Beryle Flock, RN

## 2022-10-06 NOTE — Telephone Encounter (Signed)
Called [facility] to schedule next two Bicillin injections, no answer. Left HIPAA compliant voicemail requesting callback.   Beryle Flock, RN

## 2022-10-10 NOTE — Telephone Encounter (Signed)
Called [facility], no answer. Left HIPAA compliant voicemail requesting callback.   Beryle Flock, RN

## 2022-10-11 NOTE — Telephone Encounter (Signed)
Left voicemail asking patient to return my call.   Dionis Autry P Bassel Gaskill, CMA  

## 2022-10-12 ENCOUNTER — Other Ambulatory Visit: Payer: Self-pay

## 2022-10-12 ENCOUNTER — Ambulatory Visit (INDEPENDENT_AMBULATORY_CARE_PROVIDER_SITE_OTHER): Payer: 59

## 2022-10-12 DIAGNOSIS — A539 Syphilis, unspecified: Secondary | ICD-10-CM

## 2022-10-12 MED ORDER — PENICILLIN G BENZATHINE 1200000 UNIT/2ML IM SUSY
2.4000 10*6.[IU] | PREFILLED_SYRINGE | Freq: Once | INTRAMUSCULAR | Status: AC
  Administered 2022-10-12: 2.4 10*6.[IU] via INTRAMUSCULAR

## 2022-10-12 NOTE — Telephone Encounter (Signed)
Spoke to the patient and he came in today for Bicillin 2.4 MU #2. Patient will return next Thursday for 3rd dose of Bicillin 2.4 MU Thomas [name] Tilda Burrow, CMA

## 2022-10-12 NOTE — Telephone Encounter (Signed)
left voicemail asking patient to return my call.

## 2022-10-19 ENCOUNTER — Ambulatory Visit (INDEPENDENT_AMBULATORY_CARE_PROVIDER_SITE_OTHER): Payer: 59

## 2022-10-19 ENCOUNTER — Other Ambulatory Visit: Payer: Self-pay

## 2022-10-19 DIAGNOSIS — A539 Syphilis, unspecified: Secondary | ICD-10-CM

## 2022-10-19 MED ORDER — PENICILLIN G BENZATHINE 1200000 UNIT/2ML IM SUSY
2.4000 10*6.[IU] | PREFILLED_SYRINGE | Freq: Once | INTRAMUSCULAR | Status: AC
  Administered 2022-10-19: 2.4 10*6.[IU] via INTRAMUSCULAR

## 2023-03-22 ENCOUNTER — Ambulatory Visit: Payer: 59 | Admitting: Internal Medicine

## 2023-03-30 ENCOUNTER — Ambulatory Visit: Payer: 59 | Admitting: Internal Medicine

## 2023-05-17 ENCOUNTER — Other Ambulatory Visit (HOSPITAL_COMMUNITY)
Admission: RE | Admit: 2023-05-17 | Discharge: 2023-05-17 | Disposition: A | Discharge status: Home / Self Care | Payer: 59 | Source: Ambulatory Visit | Attending: Internal Medicine | Admitting: Internal Medicine

## 2023-05-17 ENCOUNTER — Other Ambulatory Visit (HOSPITAL_COMMUNITY): Payer: Self-pay

## 2023-05-17 ENCOUNTER — Telehealth: Payer: Self-pay | Admitting: Pharmacist

## 2023-05-17 ENCOUNTER — Ambulatory Visit (INDEPENDENT_AMBULATORY_CARE_PROVIDER_SITE_OTHER): Payer: 59 | Admitting: Internal Medicine

## 2023-05-17 ENCOUNTER — Other Ambulatory Visit: Payer: Self-pay

## 2023-05-17 ENCOUNTER — Encounter: Payer: Self-pay | Admitting: Internal Medicine

## 2023-05-17 VITALS — BP 130/88 | HR 79 | Temp 97.8°F | Resp 16 | Wt 221.4 lb

## 2023-05-17 DIAGNOSIS — N183 Chronic kidney disease, stage 3 unspecified: Secondary | ICD-10-CM

## 2023-05-17 DIAGNOSIS — Z113 Encounter for screening for infections with a predominantly sexual mode of transmission: Secondary | ICD-10-CM | POA: Insufficient documentation

## 2023-05-17 DIAGNOSIS — Z23 Encounter for immunization: Secondary | ICD-10-CM | POA: Diagnosis not present

## 2023-05-17 DIAGNOSIS — B20 Human immunodeficiency virus [HIV] disease: Secondary | ICD-10-CM

## 2023-05-17 LAB — URINE CYTOLOGY ANCILLARY ONLY
Chlamydia: NEGATIVE
Comment: NEGATIVE
Comment: NORMAL
Neisseria Gonorrhea: NEGATIVE

## 2023-05-17 LAB — CYTOLOGY, (ORAL, ANAL, URETHRAL) ANCILLARY ONLY
Chlamydia: NEGATIVE
Comment: NEGATIVE
Comment: NORMAL
Neisseria Gonorrhea: NEGATIVE

## 2023-05-17 MED ORDER — CABOTEGRAVIR & RILPIVIRINE ER 600 & 900 MG/3ML IM SUER
1.0000 | INTRAMUSCULAR | 1 refills | Status: DC
  Filled 2023-05-17: qty 6, 30d supply, fill #0
  Filled 2023-06-07: qty 6, 30d supply, fill #1

## 2023-05-17 MED ORDER — CABOTEGRAVIR & RILPIVIRINE ER 600 & 900 MG/3ML IM SUER: 1.0000 | Freq: Once | INTRAMUSCULAR | Status: DC

## 2023-05-17 NOTE — Progress Notes (Signed)
Specialty Pharmacy Initial Fill Coordination Note  Thomas [name] is a 42 y.o. male contacted today regarding refills of specialty medication(s) Cabotegravir & Rilpivirine   Patient requested Courier to Provider Office   Delivery date: 05/21/23   Verified address: [facility] [address] [place]  [address]   Medication will be filled on 05/18/23.   Patient is aware of 0.00 copayment.

## 2023-05-17 NOTE — Progress Notes (Signed)
Subjective:    Patient ID: Thomas [name], male    DOB: [birth year], 42 y.o.   MRN: [id number]  HPI Thomas [name] is here for follow up of hIV He continues on Triumeq and denies any missed doses.  No issues with getting or taking his medication. No new issues.  He is followed by Dr. Clelia Croft for oncology, Dr. Jacinto Halim for cardiology and by nephrology, but has not followed up recently.  He was treated for syphillis last visit.    Review of Systems  Constitutional:  Negative for fatigue.  Gastrointestinal:  Negative for diarrhea and nausea.  Skin:  Negative for rash.       Objective:   Physical Exam Eyes:     General: No scleral icterus. Pulmonary:     Effort: Pulmonary effort is normal.  Neurological:     Mental Status: He is alert.   SH: +tobacco        Assessment & Plan:

## 2023-05-17 NOTE — Assessment & Plan Note (Signed)
Will screen today 

## 2023-05-17 NOTE — Assessment & Plan Note (Signed)
He is doing well at this time.  He is interested in George and will be considered.  Discussed with him.  Otherwise, can consider changing him to [facility] at some point to get him off of abacavir.  Follow up otherwise in 6 months.

## 2023-05-17 NOTE — Telephone Encounter (Signed)
Patient is interested in starting Guinea. Reviewed chart and lab work. No resistance mutations found. He has been compliant and undetectable on Triumeq for quite some time.   Counseled that Guinea is two separate intramuscular injections in the gluteal muscle on each side for each visit. Explained that the second injection is 30 days after the initial injection then every 2 months thereafter. Discussed the rare but significant chance of developing resistance despite compliance. Explained that showing up to injection appointments is very important and warned that if 2 appointments are missed, it will be reassessed by their provider whether they are a good candidate for injection therapy. He does have reliable transportation and the ability to make time for appointments. Counseled on possible side effects associated with the injections such as injection site pain, which is usually mild to moderate in nature, injection site nodules, and injection site reactions. Asked to call the clinic or send me a mychart message if they experience any issues, such as fatigue, nausea, headache, rash, or dizziness. Advised that he can take ibuprofen or tylenol for injection site pain if needed.   He is not currently working and his mother usually brings him to appointments. He prefers morning appointments as his mother works in the afternoon. He reviewed the Guinea contract and signed it. Insurance approved [place] and will be sent here from [employer] at [employer]. He will see me next week on 10/30 to start the injections.   Ellanore Vanhook L. Torrence Branagan, PharmD, BCIDP, AAHIVP, CPP Clinical Pharmacist Practitioner Infectious Diseases Clinical Pharmacist [facility] 05/17/2023, 10:18 AM

## 2023-05-17 NOTE — Assessment & Plan Note (Signed)
I encouraged him to go back to nephrology.  Previous creat reviewed with him and is stable.

## 2023-05-18 LAB — T-HELPER CELL (CD4) - (RCID CLINIC ONLY)
CD4 % Helper T Cell: 44 % (ref 33–65)
CD4 T Cell Abs: 929 /uL (ref 400–1790)

## 2023-05-21 ENCOUNTER — Telehealth: Payer: Self-pay

## 2023-05-21 NOTE — Telephone Encounter (Signed)
[facility] Patient Advocate Encounter  Patient's medications (CABENUVA) have been couriered to [facility] from [facility] and will be administered at the patients appointment on 05/23/23.  Kae Heller , CPhT Specialty Pharmacy Patient [facility] Phone: [phone] Fax:  [phone]

## 2023-05-22 LAB — RPR: RPR Ser Ql: REACTIVE — AB

## 2023-05-22 LAB — RPR TITER: RPR Titer: 1:16 {titer} — ABNORMAL HIGH

## 2023-05-22 LAB — HIV-1 RNA QUANT-NO REFLEX-BLD
HIV 1 RNA Quant: NOT DETECTED {copies}/mL
HIV-1 RNA Quant, Log: NOT DETECTED {Log_copies}/mL

## 2023-05-22 LAB — T PALLIDUM AB: T Pallidum Abs: POSITIVE — AB

## 2023-05-22 NOTE — Progress Notes (Deleted)
HPI: Thomas [name] is a 42 y.o. male who presents to the [facility] pharmacy clinic for Delta administration.  Patient Active Problem List   Diagnosis Date Noted   Need for prophylactic vaccination and inoculation against influenza 05/09/2021   Encounter for long-term (current) use of high-risk medication 08/16/2020   Medication monitoring encounter 03/06/2017   Screening examination for venereal disease 02/24/2016   Acute coronary syndrome (HCC) 10/27/2015   Unstable angina (HCC) 10/27/2015   Obstructive uropathy 12/12/2014   Nephrolithiasis 12/12/2014   Hyperlipidemia 12/10/2014   CKD (chronic kidney disease) s/p Right nephrectomy (04/30/2014) 05/19/2014   Renal neoplasm 04/30/2014   Hypokalemia 01/22/2014   Hodgkin's disease (HCC) 08/26/2013   Iron deficiency anemia, unspecified 08/04/2013   Penile cyst 08/06/2012   HIV disease (HCC) 06/25/2012    Patient's Medications  New Prescriptions   No medications on file  Previous Medications   ABACAVIR-DOLUTEGRAVIR-LAMIVUDINE (TRIUMEQ) 600-50-300 MG TABLET    Take 1 tablet by mouth daily.   ASPIR-LOW 81 MG EC TABLET    TAKE ONE TABLET BY MOUTH DAILY   ATORVASTATIN (LIPITOR) 40 MG TABLET    TAKE ONE TABLET BY MOUTH DAILY   CABOTEGRAVIR & RILPIVIRINE ER (CABENUVA) 600 & 900 MG/3ML INJECTION    Inject 1 kit into the muscle every 30 (thirty) days.   CYCLOBENZAPRINE (FLEXERIL) 10 MG TABLET    Take 1 tablet (10 mg total) by mouth 2 (two) times daily as needed for muscle spasms.   METOPROLOL SUCCINATE (TOPROL-XL) 25 MG 24 HR TABLET    TAKE ONE TABLET BY MOUTH DAILY WITH OR IMMEDIATELY FOLLOWING A MEAL   NITROGLYCERIN (NITROSTAT) 0.4 MG SL TABLET    Place 1 tablet (0.4 mg total) under the tongue every 5 (five) minutes as needed for up to 25 days for chest pain.  Modified Medications   No medications on file  Discontinued Medications   No medications on file    Allergies: Allergies  Allergen Reactions   Tylenol [Acetaminophen] Other  (See Comments)    sweats    Labs: Lab Results  Component Value Date   HIV1RNAQUANT Not Detected 05/17/2023   HIV1RNAQUANT Not Detected 09/21/2022   HIV1RNAQUANT Not Detected 05/09/2021   CD4TABS 929 05/17/2023   CD4TABS 942 09/21/2022   CD4TABS 918 05/09/2021    RPR and STI Lab Results  Component Value Date   LABRPR REACTIVE (A) 05/17/2023   LABRPR REACTIVE (A) 09/21/2022   LABRPR NON-REACTIVE 08/16/2020   LABRPR NON-REACTIVE 02/17/2019   LABRPR NON-REACTIVE 09/06/2017   RPRTITER 1:16 (H) 05/17/2023   RPRTITER 1:32 (H) 09/21/2022    STI Results GC GC CT CT  Latest Ref Rng & Units  NEGATIVE  NEGATIVE  05/17/2023  9:52 AM Negative    Negative   Negative    Negative    09/21/2022  9:37 AM Negative   Negative    08/16/2020 10:14 AM Negative   Negative    09/06/2017 12:00 AM Negative   Negative    02/16/2016 12:00 AM Negative   Negative    12/30/2015 12:00 AM Negative   Negative    02/23/2014  9:46 PM  NEGATIVE   NEGATIVE   08/03/2013  2:04 PM  NEGATIVE   NEGATIVE     Hepatitis B Lab Results  Component Value Date   HEPBSAB POS (A) 06/06/2012   HEPBSAG NEGATIVE 05/19/2014   HEPBCAB NEG 06/06/2012   Hepatitis C No results found for: "HEPCAB", "HCVRNAPCRQN" Hepatitis A Lab Results  Component Value Date  HAV NEG 06/06/2012   Lipids: Lab Results  Component Value Date   CHOL 209 (H) 09/21/2022   TRIG 205 (H) 09/21/2022   HDL 47 09/21/2022   CHOLHDL 4.4 09/21/2022   VLDL 12 10/28/2015   LDLCALC 128 (H) 09/21/2022    Current HIV Regimen: Biktarvy  TARGET DATE: Starting today - TD 30th  Assessment: Thomas [name] presents today for his first initiation injection for Cabenuva. Counseled that Guinea is two separate intramuscular injections in the gluteal muscle on each side for each visit. Explained that the second injection is 30 days after the initial injection then every 2 months thereafter. Discussed the rare but significant chance of developing resistance  despite compliance. Explained that showing up to injection appointments is very important and warned that if 2 appointments are missed, it will be reassessed by their provider whether they are a good candidate for injection therapy. Counseled on possible side effects associated with the injections such as injection site pain, which is usually mild to moderate in nature, injection site nodules, and injection site reactions. Asked to call the clinic or send me a mychart message if they experience any issues, such as fatigue, nausea, headache, rash, or dizziness. Advised that they can take ibuprofen or tylenol for injection site pain if needed.   Last HIV RNA was undetectable on 05/17/23. Will defer today but will check next month.  Administered cabotegravir 600mg /78mL in left upper outer quadrant of the gluteal muscle. Administered rilpivirine 900 mg/4mL in the right upper outer quadrant of the gluteal muscle. Monitored patient for 10 minutes after injection. Injections were tolerated well without issue. Counseled to stop taking Biktarvy after today's dose and to call with any issues that may arise. Will make follow up appointments for second initiation injection in 30 days and then maintenance injections every 2 months thereafter.   Plan: - Stop Biktarvy after today's dose - First Cabenuva injections administered - Second initiation injection scheduled for *** - Maintenance injections scheduled for *** - Call with any issues or questions  Thomas [name], PharmD, BCIDP, AAHIVP, CPP Clinical Pharmacist Practitioner Infectious Diseases Clinical Pharmacist [facility]

## 2023-05-23 ENCOUNTER — Ambulatory Visit: Payer: 59 | Admitting: Pharmacist

## 2023-05-28 ENCOUNTER — Other Ambulatory Visit: Payer: Self-pay

## 2023-05-30 ENCOUNTER — Ambulatory Visit: Payer: 59 | Admitting: Pharmacist

## 2023-05-30 ENCOUNTER — Other Ambulatory Visit: Payer: Self-pay

## 2023-05-30 DIAGNOSIS — B20 Human immunodeficiency virus [HIV] disease: Secondary | ICD-10-CM

## 2023-05-30 MED ORDER — CABOTEGRAVIR & RILPIVIRINE ER 600 & 900 MG/3ML IM SUER
1.0000 | Freq: Once | INTRAMUSCULAR | Status: AC
  Administered 2023-05-30: 1 via INTRAMUSCULAR

## 2023-05-30 NOTE — Progress Notes (Signed)
HPI: Thomas [name] is a 42 y.o. male who presents to the [facility] pharmacy clinic for Warren administration.  Patient Active Problem List   Diagnosis Date Noted   Need for prophylactic vaccination and inoculation against influenza 05/09/2021   Encounter for long-term (current) use of high-risk medication 08/16/2020   Medication monitoring encounter 03/06/2017   Screening examination for venereal disease 02/24/2016   Acute coronary syndrome (HCC) 10/27/2015   Unstable angina (HCC) 10/27/2015   Obstructive uropathy 12/12/2014   Nephrolithiasis 12/12/2014   Hyperlipidemia 12/10/2014   CKD (chronic kidney disease) s/p Right nephrectomy (04/30/2014) 05/19/2014   Renal neoplasm 04/30/2014   Hypokalemia 01/22/2014   Hodgkin's disease (HCC) 08/26/2013   Iron deficiency anemia, unspecified 08/04/2013   Penile cyst 08/06/2012   HIV disease (HCC) 06/25/2012    Patient's Medications  New Prescriptions   No medications on file  Previous Medications   ABACAVIR-DOLUTEGRAVIR-LAMIVUDINE (TRIUMEQ) 600-50-300 MG TABLET    Take 1 tablet by mouth daily.   ASPIR-LOW 81 MG EC TABLET    TAKE ONE TABLET BY MOUTH DAILY   ATORVASTATIN (LIPITOR) 40 MG TABLET    TAKE ONE TABLET BY MOUTH DAILY   CABOTEGRAVIR & RILPIVIRINE ER (CABENUVA) 600 & 900 MG/3ML INJECTION    Inject 1 kit into the muscle every 30 (thirty) days.   CYCLOBENZAPRINE (FLEXERIL) 10 MG TABLET    Take 1 tablet (10 mg total) by mouth 2 (two) times daily as needed for muscle spasms.   METOPROLOL SUCCINATE (TOPROL-XL) 25 MG 24 HR TABLET    TAKE ONE TABLET BY MOUTH DAILY WITH OR IMMEDIATELY FOLLOWING A MEAL   NITROGLYCERIN (NITROSTAT) 0.4 MG SL TABLET    Place 1 tablet (0.4 mg total) under the tongue every 5 (five) minutes as needed for up to 25 days for chest pain.  Modified Medications   No medications on file  Discontinued Medications   No medications on file    Allergies: Allergies  Allergen Reactions   Tylenol [Acetaminophen] Other  (See Comments)    sweats    Labs: Lab Results  Component Value Date   HIV1RNAQUANT Not Detected 05/17/2023   HIV1RNAQUANT Not Detected 09/21/2022   HIV1RNAQUANT Not Detected 05/09/2021   CD4TABS 929 05/17/2023   CD4TABS 942 09/21/2022   CD4TABS 918 05/09/2021    RPR and STI Lab Results  Component Value Date   LABRPR REACTIVE (A) 05/17/2023   LABRPR REACTIVE (A) 09/21/2022   LABRPR NON-REACTIVE 08/16/2020   LABRPR NON-REACTIVE 02/17/2019   LABRPR NON-REACTIVE 09/06/2017   RPRTITER 1:16 (H) 05/17/2023   RPRTITER 1:32 (H) 09/21/2022    STI Results GC GC CT CT  Latest Ref Rng & Units  NEGATIVE  NEGATIVE  05/17/2023  9:52 AM Negative    Negative   Negative    Negative    09/21/2022  9:37 AM Negative   Negative    08/16/2020 10:14 AM Negative   Negative    09/06/2017 12:00 AM Negative   Negative    02/16/2016 12:00 AM Negative   Negative    12/30/2015 12:00 AM Negative   Negative    02/23/2014  9:46 PM  NEGATIVE   NEGATIVE   08/03/2013  2:04 PM  NEGATIVE   NEGATIVE     Hepatitis B Lab Results  Component Value Date   HEPBSAB POS (A) 06/06/2012   HEPBSAG NEGATIVE 05/19/2014   HEPBCAB NEG 06/06/2012   Hepatitis C No results found for: "HEPCAB", "HCVRNAPCRQN" Hepatitis A Lab Results  Component Value Date  HAV NEG 06/06/2012   Lipids: Lab Results  Component Value Date   CHOL 209 (H) 09/21/2022   TRIG 205 (H) 09/21/2022   HDL 47 09/21/2022   CHOLHDL 4.4 09/21/2022   VLDL 12 10/28/2015   LDLCALC 128 (H) 09/21/2022    Current HIV Regimen: Triumeq  TARGET DATE: The 6th of month  Assessment: Thomas [name] presents today for his first initiation injection for Cabenuva. Counseled that Thomas [name] is two separate intramuscular injections in the gluteal muscle on each side for each visit. Explained that the second injection is 30 days after the initial injection then every 2 months thereafter. Discussed the rare but significant chance of developing resistance despite  compliance. Explained that showing up to injection appointments is very important and warned that if 2 appointments are missed, it will be reassessed by their provider whether they are a good candidate for injection therapy. Counseled on possible side effects associated with the injections such as injection site pain, which is usually mild to moderate in nature, injection site nodules, and injection site reactions. Asked to call the clinic or send me a mychart message if they experience any issues, such as fatigue, nausea, headache, rash, or dizziness. Advised that they can take ibuprofen or tylenol for injection site pain if needed.   Administered cabotegravir 600mg /54mL in left upper outer quadrant of the gluteal muscle. Administered rilpivirine 900 mg/37mL in the right upper outer quadrant of the gluteal muscle. Monitored patient for 10 minutes after injection. Injections were tolerated well without issue. Counseled to stop taking Triumeq after today's dose and to call with any issues that may arise. Will make follow up appointments for second initiation injection in 30 days and then maintenance injections every 2 months thereafter.   [name] declines STI testing, and declines Menveo and COVID vaccines today. We will follow up with this at next appointment, once he is more acclimated to Cabenuva injections.  Plan: - Stop Triumeq after today's dose - First Cabenuva injections administered - Second initiation injection scheduled for 06/25/23 with Cassie - Maintenance injections scheduled for 08/27/23 with Cassie - Call with any issues or questions  Lora Paula, PharmD PGY-2 Infectious Diseases Pharmacy Resident [facility] for Infectious Disease

## 2023-05-31 ENCOUNTER — Other Ambulatory Visit: Payer: Self-pay

## 2023-06-07 ENCOUNTER — Other Ambulatory Visit (HOSPITAL_COMMUNITY): Payer: Self-pay

## 2023-06-07 ENCOUNTER — Other Ambulatory Visit: Payer: Self-pay

## 2023-06-07 NOTE — Progress Notes (Signed)
Specialty Pharmacy Refill Coordination Note  Thomas [name] is a 42 y.o. male assessed today regarding refills of clinic administered specialty medication(s) Cabotegravir & Rilpivirine   Clinic requested Courier to Provider Office   Delivery date: 06/19/23   Verified address: [facility] [address] [name] [address]   Medication will be filled on 06/18/23.

## 2023-06-19 ENCOUNTER — Other Ambulatory Visit: Payer: Self-pay

## 2023-06-20 ENCOUNTER — Telehealth: Payer: Self-pay

## 2023-06-20 NOTE — Telephone Encounter (Signed)
[facility] Patient Advocate Encounter  Patient's medications CABENUVA have been couriered to [facility] from [facility] and will be administered at the patients appointment on 06/25/23.  Kae Heller, CPhT Specialty Pharmacy Patient [facility] for Infectious Disease Phone: [phone] Fax:  [phone]

## 2023-06-22 NOTE — Progress Notes (Unsigned)
HPI: Thomas [name] is a 42 y.o. male who presents to the [facility] pharmacy clinic for [place] administration.  Patient Active Problem List   Diagnosis Date Noted   Need for prophylactic vaccination and inoculation against influenza 05/09/2021   Encounter for long-term (current) use of high-risk medication 08/16/2020   Medication monitoring encounter 03/06/2017   Screening examination for venereal disease 02/24/2016   Acute coronary syndrome (HCC) 10/27/2015   Unstable angina (HCC) 10/27/2015   Obstructive uropathy 12/12/2014   Nephrolithiasis 12/12/2014   Hyperlipidemia 12/10/2014   CKD (chronic kidney disease) s/p Right nephrectomy (04/30/2014) 05/19/2014   Renal neoplasm 04/30/2014   Hypokalemia 01/22/2014   Hodgkin's disease (HCC) 08/26/2013   Iron deficiency anemia, unspecified 08/04/2013   Penile cyst 08/06/2012   HIV disease (HCC) 06/25/2012    Patient's Medications  New Prescriptions   No medications on file  Previous Medications   ABACAVIR-DOLUTEGRAVIR-LAMIVUDINE (TRIUMEQ) 600-50-300 MG TABLET    Take 1 tablet by mouth daily.   ASPIR-LOW 81 MG EC TABLET    TAKE ONE TABLET BY MOUTH DAILY   ATORVASTATIN (LIPITOR) 40 MG TABLET    TAKE ONE TABLET BY MOUTH DAILY   CABOTEGRAVIR & RILPIVIRINE ER (CABENUVA) 600 & 900 MG/3ML INJECTION    Inject 1 kit into the muscle every 30 (thirty) days.   CYCLOBENZAPRINE (FLEXERIL) 10 MG TABLET    Take 1 tablet (10 mg total) by mouth 2 (two) times daily as needed for muscle spasms.   METOPROLOL SUCCINATE (TOPROL-XL) 25 MG 24 HR TABLET    TAKE ONE TABLET BY MOUTH DAILY WITH OR IMMEDIATELY FOLLOWING A MEAL   NITROGLYCERIN (NITROSTAT) 0.4 MG SL TABLET    Place 1 tablet (0.4 mg total) under the tongue every 5 (five) minutes as needed for up to 25 days for chest pain.  Modified Medications   No medications on file  Discontinued Medications   No medications on file    Allergies: Allergies  Allergen Reactions   Tylenol [Acetaminophen] Other  (See Comments)    sweats    Labs: Lab Results  Component Value Date   HIV1RNAQUANT Not Detected 05/17/2023   HIV1RNAQUANT Not Detected 09/21/2022   HIV1RNAQUANT Not Detected 05/09/2021   CD4TABS 929 05/17/2023   CD4TABS 942 09/21/2022   CD4TABS 918 05/09/2021    RPR and STI Lab Results  Component Value Date   LABRPR REACTIVE (A) 05/17/2023   LABRPR REACTIVE (A) 09/21/2022   LABRPR NON-REACTIVE 08/16/2020   LABRPR NON-REACTIVE 02/17/2019   LABRPR NON-REACTIVE 09/06/2017   RPRTITER 1:16 (H) 05/17/2023   RPRTITER 1:32 (H) 09/21/2022    STI Results GC GC CT CT  Latest Ref Rng & Units  NEGATIVE  NEGATIVE  05/17/2023  9:52 AM Negative    Negative   Negative    Negative    09/21/2022  9:37 AM Negative   Negative    08/16/2020 10:14 AM Negative   Negative    09/06/2017 12:00 AM Negative   Negative    02/16/2016 12:00 AM Negative   Negative    12/30/2015 12:00 AM Negative   Negative    02/23/2014  9:46 PM  NEGATIVE   NEGATIVE   08/03/2013  2:04 PM  NEGATIVE   NEGATIVE     Hepatitis B Lab Results  Component Value Date   HEPBSAB POS (A) 06/06/2012   HEPBSAG NEGATIVE 05/19/2014   HEPBCAB NEG 06/06/2012   Hepatitis C No results found for: "HEPCAB", "HCVRNAPCRQN" Hepatitis A Lab Results  Component Value Date  HAV NEG 06/06/2012   Lipids: Lab Results  Component Value Date   CHOL 209 (H) 09/21/2022   TRIG 205 (H) 09/21/2022   HDL 47 09/21/2022   CHOLHDL 4.4 09/21/2022   VLDL 12 10/28/2015   LDLCALC 128 (H) 09/21/2022    TARGET DATE: The 6th  Assessment: Ebert presents today for his second initiation Cabenuva injections. Initial injections were tolerated well without issues. Last HIV RNA was undetectable on 05/17/23. Will check again today. Menveo ***  Administered cabotegravir 600mg /39mL in left upper outer quadrant of the gluteal muscle. Administered rilpivirine 900 mg/73mL in the right upper outer quadrant of the gluteal muscle. No issues with injections. He  will follow up in 2 months for next set of injections.  Plan: - Cabenuva injections administered - HIV RNA today - Next injections scheduled for 08/27/23 with me and *** with Dr. Luciana Axe  - Call with any issues or questions  Ronin Crager L. Chandlor Noecker, PharmD, BCIDP, AAHIVP, CPP Clinical Pharmacist Practitioner Infectious Diseases Clinical Pharmacist [facility]

## 2023-06-25 ENCOUNTER — Ambulatory Visit (INDEPENDENT_AMBULATORY_CARE_PROVIDER_SITE_OTHER): Payer: 59 | Admitting: Pharmacist

## 2023-06-25 ENCOUNTER — Other Ambulatory Visit: Payer: Self-pay

## 2023-06-25 DIAGNOSIS — B20 Human immunodeficiency virus [HIV] disease: Secondary | ICD-10-CM

## 2023-06-25 MED ORDER — CABOTEGRAVIR & RILPIVIRINE ER 600 & 900 MG/3ML IM SUER
1.0000 | Freq: Once | INTRAMUSCULAR | Status: AC
  Administered 2023-06-25: 1 via INTRAMUSCULAR

## 2023-06-26 LAB — HIV-1 RNA QUANT-NO REFLEX-BLD
HIV 1 RNA Quant: NOT DETECTED {copies}/mL
HIV-1 RNA Quant, Log: NOT DETECTED {Log_copies}/mL

## 2023-07-31 ENCOUNTER — Other Ambulatory Visit: Payer: Self-pay

## 2023-08-10 ENCOUNTER — Other Ambulatory Visit (HOSPITAL_COMMUNITY): Payer: Self-pay

## 2023-08-13 ENCOUNTER — Other Ambulatory Visit: Payer: Self-pay

## 2023-08-13 ENCOUNTER — Other Ambulatory Visit (HOSPITAL_COMMUNITY): Payer: Self-pay

## 2023-08-13 ENCOUNTER — Other Ambulatory Visit: Payer: Self-pay | Admitting: Pharmacist

## 2023-08-13 DIAGNOSIS — B20 Human immunodeficiency virus [HIV] disease: Secondary | ICD-10-CM

## 2023-08-13 MED ORDER — CABOTEGRAVIR & RILPIVIRINE ER 600 & 900 MG/3ML IM SUER
1.0000 | INTRAMUSCULAR | 5 refills | Status: DC
  Filled 2023-08-13: qty 6, 60d supply, fill #0
  Filled 2023-10-08: qty 6, 60d supply, fill #1
  Filled 2023-12-03: qty 6, 60d supply, fill #2
  Filled 2024-02-13: qty 6, 60d supply, fill #3
  Filled 2024-04-23: qty 6, 60d supply, fill #4

## 2023-08-13 NOTE — Progress Notes (Signed)
Specialty Pharmacy Refill Coordination Note  LARRON ERNZEN is a 43 y.o. male assessed today regarding refills of clinic administered specialty medication(s) Cabotegravir & Rilpivirine [facility])   Clinic requested Courier to Provider Office   Delivery date: 08/16/23   Verified address: [address] [place] [address]   Medication will be filled on 08/15/23.

## 2023-08-15 ENCOUNTER — Other Ambulatory Visit (HOSPITAL_COMMUNITY): Payer: Self-pay

## 2023-08-16 ENCOUNTER — Telehealth: Payer: Self-pay

## 2023-08-16 NOTE — Telephone Encounter (Signed)
[facility] Patient Advocate Encounter  Patient's medications CABENUVA have been couriered to [facility] from [facility] and will be administered at the patients appointment on 08/27/23.  Kae Heller, CPhT Specialty Pharmacy Patient [facility] for Infectious Disease Phone: [phone] Fax:  [phone]

## 2023-08-27 ENCOUNTER — Ambulatory Visit: Payer: 59 | Admitting: Pharmacist

## 2023-09-03 ENCOUNTER — Ambulatory Visit (INDEPENDENT_AMBULATORY_CARE_PROVIDER_SITE_OTHER): Payer: 59 | Admitting: Pharmacist

## 2023-09-03 ENCOUNTER — Other Ambulatory Visit: Payer: Self-pay

## 2023-09-03 DIAGNOSIS — Z23 Encounter for immunization: Secondary | ICD-10-CM

## 2023-09-03 DIAGNOSIS — B20 Human immunodeficiency virus [HIV] disease: Secondary | ICD-10-CM

## 2023-09-03 DIAGNOSIS — Z113 Encounter for screening for infections with a predominantly sexual mode of transmission: Secondary | ICD-10-CM

## 2023-09-03 DIAGNOSIS — Z79899 Other long term (current) drug therapy: Secondary | ICD-10-CM

## 2023-09-03 MED ORDER — CABOTEGRAVIR & RILPIVIRINE ER 600 & 900 MG/3ML IM SUER
1.0000 | Freq: Once | INTRAMUSCULAR | Status: AC
  Administered 2023-09-03: 1 via INTRAMUSCULAR

## 2023-09-03 NOTE — Progress Notes (Addendum)
HPI: Thomas [name] is a 43 y.o. male who presents to the [facility] pharmacy clinic for [place] administration.  Patient Active Problem List   Diagnosis Date Noted   Need for prophylactic vaccination and inoculation against influenza 05/09/2021   Encounter for long-term (current) use of high-risk medication 08/16/2020   Medication monitoring encounter 03/06/2017   Screening examination for venereal disease 02/24/2016   Acute coronary syndrome (HCC) 10/27/2015   Unstable angina (HCC) 10/27/2015   Obstructive uropathy 12/12/2014   Nephrolithiasis 12/12/2014   Hyperlipidemia 12/10/2014   CKD (chronic kidney disease) s/p Right nephrectomy (04/30/2014) 05/19/2014   Renal neoplasm 04/30/2014   Hypokalemia 01/22/2014   Hodgkin's disease (HCC) 08/26/2013   Iron deficiency anemia, unspecified 08/04/2013   Penile cyst 08/06/2012   HIV disease (HCC) 06/25/2012    Patient's Medications  New Prescriptions   No medications on file  Previous Medications   ASPIR-LOW 81 MG EC TABLET    TAKE ONE TABLET BY MOUTH DAILY   ATORVASTATIN (LIPITOR) 40 MG TABLET    TAKE ONE TABLET BY MOUTH DAILY   CABOTEGRAVIR & RILPIVIRINE ER (CABENUVA) 600 & 900 MG/3ML INJECTION    Inject 1 kit into the muscle every 2 (two) months.   CYCLOBENZAPRINE (FLEXERIL) 10 MG TABLET    Take 1 tablet (10 mg total) by mouth 2 (two) times daily as needed for muscle spasms.   METOPROLOL SUCCINATE (TOPROL-XL) 25 MG 24 HR TABLET    TAKE ONE TABLET BY MOUTH DAILY WITH OR IMMEDIATELY FOLLOWING A MEAL   NITROGLYCERIN (NITROSTAT) 0.4 MG SL TABLET    Place 1 tablet (0.4 mg total) under the tongue every 5 (five) minutes as needed for up to 25 days for chest pain.  Modified Medications   No medications on file  Discontinued Medications   No medications on file    Allergies: Allergies  Allergen Reactions   Tylenol [Acetaminophen] Other (See Comments)    sweats    Labs: Lab Results  Component Value Date   HIV1RNAQUANT Not Detected  06/25/2023   HIV1RNAQUANT Not Detected 05/17/2023   HIV1RNAQUANT Not Detected 09/21/2022   CD4TABS 929 05/17/2023   CD4TABS 942 09/21/2022   CD4TABS 918 05/09/2021    RPR and STI Lab Results  Component Value Date   LABRPR REACTIVE (A) 05/17/2023   LABRPR REACTIVE (A) 09/21/2022   LABRPR NON-REACTIVE 08/16/2020   LABRPR NON-REACTIVE 02/17/2019   LABRPR NON-REACTIVE 09/06/2017   RPRTITER 1:16 (H) 05/17/2023   RPRTITER 1:32 (H) 09/21/2022    STI Results GC GC CT CT  Latest Ref Rng & Units  NEGATIVE  NEGATIVE  05/17/2023  9:52 AM Negative    Negative   Negative    Negative    09/21/2022  9:37 AM Negative   Negative    08/16/2020 10:14 AM Negative   Negative    09/06/2017 12:00 AM Negative   Negative    02/16/2016 12:00 AM Negative   Negative    12/30/2015 12:00 AM Negative   Negative    02/23/2014  9:46 PM  NEGATIVE   NEGATIVE   08/03/2013  2:04 PM  NEGATIVE   NEGATIVE     Hepatitis B Lab Results  Component Value Date   HEPBSAB POS (A) 06/06/2012   HEPBSAG NEGATIVE 05/19/2014   HEPBCAB NEG 06/06/2012   Hepatitis C No results found for: "HEPCAB", "HCVRNAPCRQN" Hepatitis A Lab Results  Component Value Date   HAV NEG 06/06/2012   Lipids: Lab Results  Component Value Date  CHOL 209 (H) 09/21/2022   TRIG 205 (H) 09/21/2022   HDL 47 09/21/2022   CHOLHDL 4.4 09/21/2022   VLDL 12 10/28/2015   LDLCALC 128 (H) 09/21/2022    TARGET DATE: 6th  Assessment: Thomas [name] presents today for 2 month maintenance Cabenuva injections. He was last seen by [facility] in December for [facility] follow up. Past injections were tolerated well without issues. However, he does report soreness and expresses concerns for possible side effects. Assured him Renaldo Harrison is well tolerated but to let us know if he has any issues. He agreed. Last HIV RNA was undetectable in 06/25/23. He is not interested in STI testing today as he reports minimal sexual activity since his last visit but will repeat HIV RNA  today.   Administered cabotegravir 600mg /34mL in left upper outer quadrant of the gluteal muscle. Administered rilpivirine 900 mg/35mL in the right upper outer quadrant of the gluteal muscle. No issues with injections. Oren will follow up in 2 months for next set of injections.  Eligible for HepA and Menveo vaccinations today. He agreed to receive the [facility] vaccination but would like to wait until his next visit to receive the other vaccinations he is eligible for. He will be due for PCV20 in September 2025.   Plan: - Cabenuva injections administered - HIV RNA and RPR - Menveo vaccination administered, second dose due 4/25 followed by booster every 5 years - CMP, lipid panel, CBC with diff today - Next injections scheduled for 3/31 with Dr. Luciana Axe, then 6/2 with Cassie - Call with any issues or questions  Stephenie Acres, PharmD PGY1 Pharmacy Resident 09/03/2023 1:06 PM

## 2023-09-06 LAB — CBC WITH DIFFERENTIAL/PLATELET
Absolute Lymphocytes: 2127 {cells}/uL (ref 850–3900)
Absolute Monocytes: 403 {cells}/uL (ref 200–950)
Basophils Absolute: 31 {cells}/uL (ref 0–200)
Basophils Relative: 0.6 %
Eosinophils Absolute: 41 {cells}/uL (ref 15–500)
Eosinophils Relative: 0.8 %
HCT: 45.1 % (ref 38.5–50.0)
Hemoglobin: 15.3 g/dL (ref 13.2–17.1)
MCH: 33 pg (ref 27.0–33.0)
MCHC: 33.9 g/dL (ref 32.0–36.0)
MCV: 97.2 fL (ref 80.0–100.0)
MPV: 11 fL (ref 7.5–12.5)
Monocytes Relative: 7.9 %
Neutro Abs: 2499 {cells}/uL (ref 1500–7800)
Neutrophils Relative %: 49 %
Platelets: 214 10*3/uL (ref 140–400)
RBC: 4.64 10*6/uL (ref 4.20–5.80)
RDW: 11.6 % (ref 11.0–15.0)
Total Lymphocyte: 41.7 %
WBC: 5.1 10*3/uL (ref 3.8–10.8)

## 2023-09-06 LAB — T PALLIDUM AB: T Pallidum Abs: POSITIVE — AB

## 2023-09-06 LAB — COMPREHENSIVE METABOLIC PANEL
AG Ratio: 1.5 (calc) (ref 1.0–2.5)
ALT: 18 U/L (ref 9–46)
AST: 19 U/L (ref 10–40)
Albumin: 4.5 g/dL (ref 3.6–5.1)
Alkaline phosphatase (APISO): 67 U/L (ref 36–130)
BUN/Creatinine Ratio: 9 (calc) (ref 6–22)
BUN: 15 mg/dL (ref 7–25)
CO2: 21 mmol/L (ref 20–32)
Calcium: 9.5 mg/dL (ref 8.6–10.3)
Chloride: 108 mmol/L (ref 98–110)
Creat: 1.61 mg/dL — ABNORMAL HIGH (ref 0.60–1.29)
Globulin: 3.1 g/dL (ref 1.9–3.7)
Glucose, Bld: 91 mg/dL (ref 65–99)
Potassium: 4.2 mmol/L (ref 3.5–5.3)
Sodium: 140 mmol/L (ref 135–146)
Total Bilirubin: 0.4 mg/dL (ref 0.2–1.2)
Total Protein: 7.6 g/dL (ref 6.1–8.1)

## 2023-09-06 LAB — LIPID PANEL
Cholesterol: 234 mg/dL — ABNORMAL HIGH (ref <200)
HDL: 48 mg/dL (ref >=40)
LDL Cholesterol (Calc): 150 mg/dL — ABNORMAL HIGH
Non-HDL Cholesterol (Calc): 186 mg/dL — ABNORMAL HIGH (ref <130)
Total CHOL/HDL Ratio: 4.9 (calc) (ref <5.0)
Triglycerides: 218 mg/dL — ABNORMAL HIGH (ref <150)

## 2023-09-06 LAB — RPR TITER: RPR Titer: 1:16 {titer} — ABNORMAL HIGH

## 2023-09-06 LAB — RPR: RPR Ser Ql: REACTIVE — AB

## 2023-09-06 LAB — HIV-1 RNA QUANT-NO REFLEX-BLD
HIV 1 RNA Quant: NOT DETECTED {copies}/mL
HIV-1 RNA Quant, Log: NOT DETECTED {Log_copies}/mL

## 2023-10-08 ENCOUNTER — Other Ambulatory Visit (HOSPITAL_COMMUNITY): Payer: Self-pay

## 2023-10-08 ENCOUNTER — Other Ambulatory Visit: Payer: Self-pay

## 2023-10-08 NOTE — Progress Notes (Signed)
 Specialty Pharmacy Refill Coordination Note  Thomas [name] is a 43 y.o. male assessed today regarding refills of clinic administered specialty medication(s) Cabotegravir & Rilpivirine [facility])   Clinic requested Courier to Provider Office   Delivery date: 10/17/23   Verified address: [address] [place] [address]   Medication will be filled on 10/16/23.

## 2023-10-17 ENCOUNTER — Telehealth: Payer: Self-pay

## 2023-10-17 NOTE — Telephone Encounter (Signed)
 [facility] Patient Advocate Encounter  Patient's medications CABENUVA have been couriered to [facility] from [facility] and will be administered at the patients appointment on 10/22/23.  Kae Heller, CPhT Specialty Pharmacy Patient [facility] for Infectious Disease Phone: [phone] Fax:  [phone]

## 2023-10-22 ENCOUNTER — Ambulatory Visit (INDEPENDENT_AMBULATORY_CARE_PROVIDER_SITE_OTHER): Payer: 59 | Admitting: Internal Medicine

## 2023-10-22 ENCOUNTER — Other Ambulatory Visit: Payer: Self-pay

## 2023-10-22 ENCOUNTER — Encounter: Payer: Self-pay | Admitting: Internal Medicine

## 2023-10-22 VITALS — BP 135/95 | HR 101 | Temp 98.1°F | Resp 16 | Wt 228.4 lb

## 2023-10-22 DIAGNOSIS — B20 Human immunodeficiency virus [HIV] disease: Secondary | ICD-10-CM

## 2023-10-22 DIAGNOSIS — N189 Chronic kidney disease, unspecified: Secondary | ICD-10-CM

## 2023-10-22 DIAGNOSIS — Z113 Encounter for screening for infections with a predominantly sexual mode of transmission: Secondary | ICD-10-CM

## 2023-10-22 DIAGNOSIS — R000 Tachycardia, unspecified: Secondary | ICD-10-CM | POA: Diagnosis not present

## 2023-10-22 DIAGNOSIS — N183 Chronic kidney disease, stage 3 unspecified: Secondary | ICD-10-CM

## 2023-10-22 MED ORDER — CABOTEGRAVIR & RILPIVIRINE ER 600 & 900 MG/3ML IM SUER
1.0000 | Freq: Once | INTRAMUSCULAR | Status: AC
Start: 2023-10-22 — End: 2023-10-22
  Administered 2023-10-22: 1 via INTRAMUSCULAR

## 2023-10-22 NOTE — Assessment & Plan Note (Signed)
 He had been on metoprolol but has not been taking.  Encouraged him to return to his cardiologist for follow-up.

## 2023-10-22 NOTE — Assessment & Plan Note (Signed)
 He has denied any sexual activity and declined any testing today

## 2023-10-22 NOTE — Progress Notes (Signed)
   Subjective:    Patient ID: Thomas [name], male    DOB: 1980/09/04, 43 y.o.   MRN: [id number]  HPI Thomas [name] is here for follow-up of HIV. He continues on [place] and denies any missed doses with that.  He has been compliant with his appointments and no issues with the injection sites.  He is pleased with the medication.  He denies any sexual activity.   Review of Systems  Constitutional:  Negative for fatigue.  Gastrointestinal:  Negative for diarrhea.       Objective:   Physical Exam Eyes:     General: No scleral icterus. Pulmonary:     Effort: Pulmonary effort is normal.  Neurological:     Mental Status: He is alert.   SH: + tobacco        Assessment & Plan:

## 2023-10-22 NOTE — Patient Instructions (Signed)
 Please call [facility] [phone] to schedule a follow up appointment

## 2023-10-22 NOTE — Assessment & Plan Note (Signed)
 He is doing well on the medication and no changes indicated.  I reviewed the labs with him and good suppression of the virus.  Medication given today and he has follow-up in May with pharmacy.

## 2023-10-22 NOTE — Assessment & Plan Note (Signed)
 I reviewed the recent results of his creatinine which is stable.  I encouraged him to continue follow-up with nephrology.

## 2023-10-29 ENCOUNTER — Ambulatory Visit: Payer: 59 | Admitting: Pharmacist

## 2023-12-03 ENCOUNTER — Other Ambulatory Visit (HOSPITAL_COMMUNITY): Payer: Self-pay

## 2023-12-03 ENCOUNTER — Other Ambulatory Visit: Payer: Self-pay

## 2023-12-03 NOTE — Progress Notes (Signed)
 Specialty Pharmacy Refill Coordination Note  Thomas [name] is a 43 y.o. male assessed today regarding refills of clinic administered specialty medication(s) Cabotegravir  & Rilpivirine  (CABENUVA )   Clinic requested Courier to Provider Office   Delivery date: 12/20/23   Verified address: [address] [place] [address]   Medication will be filled on 12/19/23.

## 2023-12-19 ENCOUNTER — Other Ambulatory Visit: Payer: Self-pay

## 2023-12-20 ENCOUNTER — Telehealth: Payer: Self-pay

## 2023-12-20 NOTE — Telephone Encounter (Signed)
 [facility] Patient Advocate Encounter  Patient's medications CABENUVA  have been couriered to [facility] from [facility] and will be administered at the patients appointment on 12/24/23.  Verline Glow, CPhT Specialty Pharmacy Patient [facility] for Infectious Disease Phone: [phone] Fax:  [phone]

## 2023-12-21 NOTE — Progress Notes (Signed)
 The ASCVD Risk score (Arnett DK, et al., 2019) failed to calculate for the following reasons:   Risk score cannot be calculated because patient has a medical history suggesting prior/existing ASCVD  Thomas [name], BSN, RN

## 2023-12-23 NOTE — Progress Notes (Unsigned)
 HPI: Thomas [name] is a 43 y.o. male who presents to the [facility] pharmacy clinic for Cabenuva  administration.  Patient Active Problem List   Diagnosis Date Noted   Need for prophylactic vaccination and inoculation against influenza 05/09/2021   Encounter for long-term (current) use of high-risk medication 08/16/2020   Medication monitoring encounter 03/06/2017   Screening examination for venereal disease 02/24/2016   Acute coronary syndrome (HCC) 10/27/2015   Unstable angina (HCC) 10/27/2015   Obstructive uropathy 12/12/2014   Nephrolithiasis 12/12/2014   Hyperlipidemia 12/10/2014   CKD (chronic kidney disease) s/p Right nephrectomy (04/30/2014) 05/19/2014   Renal neoplasm 04/30/2014   Hypokalemia 01/22/2014   Hodgkin's disease (HCC) 08/26/2013   Iron deficiency anemia, unspecified 08/04/2013   Tachycardia 08/06/2012   Penile cyst 08/06/2012   HIV disease (HCC) 06/25/2012    Patient's Medications  New Prescriptions   No medications on file  Previous Medications   ASPIR-LOW 81 MG EC TABLET    TAKE ONE TABLET BY MOUTH DAILY   ATORVASTATIN  (LIPITOR ) 40 MG TABLET    TAKE ONE TABLET BY MOUTH DAILY   CABOTEGRAVIR  & RILPIVIRINE  ER (CABENUVA ) 600 & 900 MG/3ML INJECTION    Inject 1 kit into the muscle every 2 (two) months.   CYCLOBENZAPRINE  (FLEXERIL ) 10 MG TABLET    Take 1 tablet (10 mg total) by mouth 2 (two) times daily as needed for muscle spasms.   METOPROLOL  SUCCINATE (TOPROL -XL) 25 MG 24 HR TABLET    TAKE ONE TABLET BY MOUTH DAILY WITH OR IMMEDIATELY FOLLOWING A MEAL   NITROGLYCERIN  (NITROSTAT ) 0.4 MG SL TABLET    Place 1 tablet (0.4 mg total) under the tongue every 5 (five) minutes as needed for up to 25 days for chest pain.  Modified Medications   No medications on file  Discontinued Medications   No medications on file    Allergies: Allergies  Allergen Reactions   Tylenol  [Acetaminophen ] Other (See Comments)    sweats    Labs: Lab Results  Component Value Date    HIV1RNAQUANT Not Detected 09/03/2023   HIV1RNAQUANT Not Detected 06/25/2023   HIV1RNAQUANT Not Detected 05/17/2023   CD4TABS 929 05/17/2023   CD4TABS 942 09/21/2022   CD4TABS 918 05/09/2021    RPR and STI Lab Results  Component Value Date   LABRPR REACTIVE (A) 09/03/2023   LABRPR REACTIVE (A) 05/17/2023   LABRPR REACTIVE (A) 09/21/2022   LABRPR NON-REACTIVE 08/16/2020   LABRPR NON-REACTIVE 02/17/2019   RPRTITER 1:16 (H) 09/03/2023   RPRTITER 1:16 (H) 05/17/2023   RPRTITER 1:32 (H) 09/21/2022    STI Results GC GC CT CT  Latest Ref Rng & Units  NEGATIVE  NEGATIVE  05/17/2023  9:52 AM Negative    Negative   Negative    Negative    09/21/2022  9:37 AM Negative   Negative    08/16/2020 10:14 AM Negative   Negative    09/06/2017 12:00 AM Negative   Negative    02/16/2016 12:00 AM Negative   Negative    12/30/2015 12:00 AM Negative   Negative    02/23/2014  9:46 PM  NEGATIVE   NEGATIVE   08/03/2013  2:04 PM  NEGATIVE   NEGATIVE     Hepatitis B Lab Results  Component Value Date   HEPBSAB POS (A) 06/06/2012   HEPBSAG NEGATIVE 05/19/2014   HEPBCAB NEG 06/06/2012   Hepatitis C No results found for: "HEPCAB", "HCVRNAPCRQN" Hepatitis A Lab Results  Component Value Date   HAV NEG 06/06/2012  Lipids: Lab Results  Component Value Date   CHOL 234 (H) 09/03/2023   TRIG 218 (H) 09/03/2023   HDL 48 09/03/2023   CHOLHDL 4.9 09/03/2023   VLDL 12 10/28/2015   LDLCALC 150 (H) 09/03/2023    TARGET DATE: The 6th  Assessment: Thomas [name] presents today for his maintenance Cabenuva  injections. Past injections were tolerated well without issues. Last HIV RNA was not detected in February. Doing well with no issues today. Due for Hepatitis A vaccine and second Menveo. Accepts both of these today. Will schedule his next provider follow up in October and the rest with me.  Administered cabotegravir  600mg /71mL in left upper outer quadrant of the gluteal muscle. Administered rilpivirine  900  mg/3mL in the right upper outer quadrant of the gluteal muscle. No issues with injections. He will follow up in 2 months for next set of injections.  Plan: - Cabenuva  injections administered - Hepatitis A vaccine #1/2 given today - next due in December - 2nd Menveo administered; booster due in 5 years - Next injections scheduled for 02/26/24 with me and 04/29/24 with Dr. Shereen [name]  - Call with any issues or questions  Thomas [name] L. Thomas [name], PharmD, BCIDP, AAHIVP, CPP Clinical Pharmacist Practitioner - Infectious Diseases Clinical Pharmacist Lead - Specialty Pharmacy [facility] for Infectious Disease 12/23/2023, 6:02 PM

## 2023-12-24 ENCOUNTER — Ambulatory Visit (INDEPENDENT_AMBULATORY_CARE_PROVIDER_SITE_OTHER): Payer: 59 | Admitting: Pharmacist

## 2023-12-24 ENCOUNTER — Other Ambulatory Visit: Payer: Self-pay

## 2023-12-24 DIAGNOSIS — B20 Human immunodeficiency virus [HIV] disease: Secondary | ICD-10-CM

## 2023-12-24 DIAGNOSIS — Z23 Encounter for immunization: Secondary | ICD-10-CM

## 2023-12-24 MED ORDER — CABOTEGRAVIR & RILPIVIRINE ER 600 & 900 MG/3ML IM SUER
1.0000 | Freq: Once | INTRAMUSCULAR | Status: AC
  Administered 2023-12-24: 1 via INTRAMUSCULAR

## 2023-12-26 ENCOUNTER — Other Ambulatory Visit (HOSPITAL_COMMUNITY): Payer: Self-pay

## 2024-02-13 ENCOUNTER — Other Ambulatory Visit (HOSPITAL_COMMUNITY): Payer: Self-pay

## 2024-02-13 ENCOUNTER — Other Ambulatory Visit: Payer: Self-pay

## 2024-02-13 NOTE — Progress Notes (Signed)
 Specialty Pharmacy Refill Coordination Note  PEARLY APACHITO is a 43 y.o. male assessed today regarding refills of clinic administered specialty medication(s) Cabotegravir  & Rilpivirine  (CABENUVA )   Clinic requested Courier to Provider Office   Delivery date: 02/18/24   Verified address: [address] [place] [address]   Medication will be filled on 02/15/24.

## 2024-02-15 ENCOUNTER — Other Ambulatory Visit: Payer: Self-pay

## 2024-02-18 ENCOUNTER — Telehealth: Payer: Self-pay

## 2024-02-18 NOTE — Telephone Encounter (Signed)
 [facility] Patient Advocate Encounter  Patient's medications Cabenuva  have been couriered to [facility] from [facility] and will be administered at the patients appointment on 02/26/24.  Arland Hutchinson, CPhT Specialty Pharmacy Patient [facility] for Infectious Disease Phone: [phone] Fax:  [phone]

## 2024-02-25 NOTE — Progress Notes (Unsigned)
 HPI: Thomas [name] is a 43 y.o. male who presents to the [facility] pharmacy clinic for Cabenuva  administration.  Patient Active Problem List   Diagnosis Date Noted   Need for prophylactic vaccination and inoculation against influenza 05/09/2021   Encounter for long-term (current) use of high-risk medication 08/16/2020   Medication monitoring encounter 03/06/2017   Screening examination for venereal disease 02/24/2016   Acute coronary syndrome (HCC) 10/27/2015   Unstable angina (HCC) 10/27/2015   Obstructive uropathy 12/12/2014   Nephrolithiasis 12/12/2014   Hyperlipidemia 12/10/2014   CKD (chronic kidney disease) s/p Right nephrectomy (04/30/2014) 05/19/2014   Renal neoplasm 04/30/2014   Hypokalemia 01/22/2014   Hodgkin's disease (HCC) 08/26/2013   Iron deficiency anemia, unspecified 08/04/2013   Tachycardia 08/06/2012   Penile cyst 08/06/2012   HIV disease (HCC) 06/25/2012    Patient's Medications  New Prescriptions   No medications on file  Previous Medications   ASPIR-LOW 81 MG EC TABLET    TAKE ONE TABLET BY MOUTH DAILY   ATORVASTATIN  (LIPITOR ) 40 MG TABLET    TAKE ONE TABLET BY MOUTH DAILY   CABOTEGRAVIR  & RILPIVIRINE  ER (CABENUVA ) 600 & 900 MG/3ML INJECTION    Inject 1 kit into the muscle every 2 (two) months.   CYCLOBENZAPRINE  (FLEXERIL ) 10 MG TABLET    Take 1 tablet (10 mg total) by mouth 2 (two) times daily as needed for muscle spasms.   METOPROLOL  SUCCINATE (TOPROL -XL) 25 MG 24 HR TABLET    TAKE ONE TABLET BY MOUTH DAILY WITH OR IMMEDIATELY FOLLOWING A MEAL   NITROGLYCERIN  (NITROSTAT ) 0.4 MG SL TABLET    Place 1 tablet (0.4 mg total) under the tongue every 5 (five) minutes as needed for up to 25 days for chest pain.  Modified Medications   No medications on file  Discontinued Medications   No medications on file    Allergies: Allergies  Allergen Reactions   Tylenol  [Acetaminophen ] Other (See Comments)    sweats    Labs: Lab Results  Component Value Date    HIV1RNAQUANT Not Detected 09/03/2023   HIV1RNAQUANT Not Detected 06/25/2023   HIV1RNAQUANT Not Detected 05/17/2023   CD4TABS 929 05/17/2023   CD4TABS 942 09/21/2022   CD4TABS 918 05/09/2021    RPR and STI Lab Results  Component Value Date   LABRPR REACTIVE (A) 09/03/2023   LABRPR REACTIVE (A) 05/17/2023   LABRPR REACTIVE (A) 09/21/2022   LABRPR NON-REACTIVE 08/16/2020   LABRPR NON-REACTIVE 02/17/2019   RPRTITER 1:16 (H) 09/03/2023   RPRTITER 1:16 (H) 05/17/2023   RPRTITER 1:32 (H) 09/21/2022    STI Results GC GC CT CT  Latest Ref Rng & Units  NEGATIVE  NEGATIVE  05/17/2023  9:52 AM Negative    Negative   Negative    Negative    09/21/2022  9:37 AM Negative   Negative    08/16/2020 10:14 AM Negative   Negative    09/06/2017 12:00 AM Negative   Negative    02/16/2016 12:00 AM Negative   Negative    12/30/2015 12:00 AM Negative   Negative    02/23/2014  9:46 PM  NEGATIVE   NEGATIVE   08/03/2013  2:04 PM  NEGATIVE   NEGATIVE     Hepatitis B Lab Results  Component Value Date   HEPBSAB POS (A) 06/06/2012   HEPBSAG NEGATIVE 05/19/2014   HEPBCAB NEG 06/06/2012   Hepatitis C No results found for: HEPCAB, HCVRNAPCRQN Hepatitis A Lab Results  Component Value Date   HAV NEG 06/06/2012  Lipids: Lab Results  Component Value Date   CHOL 234 (H) 09/03/2023   TRIG 218 (H) 09/03/2023   HDL 48 09/03/2023   CHOLHDL 4.9 09/03/2023   VLDL 12 10/28/2015   LDLCALC 150 (H) 09/03/2023    TARGET DATE: The 6th  Assessment: Thomas [name] presents today for his maintenance Cabenuva  injections. Past injections were tolerated well without issues. Last HIV RNA was undetectable in February. Due for updated lab work so will order for today. Declines any STI testing. Due for shingles vaccine and accepts to start that today. Last one due in 2-6 months. Due for PCV20 after September and final Hep A vaccine in December.  Administered cabotegravir  600mg /85mL in left upper outer quadrant of the  gluteal muscle. Administered rilpivirine  900 mg/3mL in the right upper outer quadrant of the gluteal muscle. No issues with injections. He will follow up in 2 months for next set of injections.  Plan: - Cabenuva  injections administered - HIV RNA and CD4 count today - Shingles vaccine #1/2 today - Next injections scheduled for 04/29/24 with Dr. Overton and 06/24/24 with me - Call with any issues or questions  Brok Stocking L. Chabeli Barsamian, PharmD, BCIDP, AAHIVP, CPP Clinical Pharmacist Practitioner - Infectious Diseases Clinical Pharmacist Lead - Specialty Pharmacy [facility] for Infectious Disease

## 2024-02-26 ENCOUNTER — Other Ambulatory Visit: Payer: Self-pay

## 2024-02-26 ENCOUNTER — Ambulatory Visit: Admitting: Pharmacist

## 2024-02-26 DIAGNOSIS — Z23 Encounter for immunization: Secondary | ICD-10-CM | POA: Diagnosis not present

## 2024-02-26 DIAGNOSIS — B20 Human immunodeficiency virus [HIV] disease: Secondary | ICD-10-CM

## 2024-02-26 MED ORDER — CABOTEGRAVIR & RILPIVIRINE ER 600 & 900 MG/3ML IM SUER
1.0000 | Freq: Once | INTRAMUSCULAR | Status: AC
  Administered 2024-02-26: 1 via INTRAMUSCULAR

## 2024-02-27 LAB — T-HELPER CELLS (CD4) COUNT (NOT AT ARMC)
CD4 % Helper T Cell: 47 % (ref 33–65)
CD4 T Cell Abs: 686 /uL (ref 400–1790)

## 2024-02-28 LAB — HIV-1 RNA QUANT-NO REFLEX-BLD
HIV 1 RNA Quant: NOT DETECTED {copies}/mL
HIV-1 RNA Quant, Log: NOT DETECTED {Log_copies}/mL

## 2024-04-22 ENCOUNTER — Other Ambulatory Visit (HOSPITAL_COMMUNITY): Payer: Self-pay

## 2024-04-23 ENCOUNTER — Other Ambulatory Visit: Payer: Self-pay

## 2024-04-23 ENCOUNTER — Other Ambulatory Visit (HOSPITAL_COMMUNITY): Payer: Self-pay

## 2024-04-23 NOTE — Progress Notes (Signed)
 Specialty Pharmacy Refill Coordination Note  Thomas [name] is a 43 y.o. male assessed today regarding refills of clinic administered specialty medication(s) Cabotegravir  & Rilpivirine  (CABENUVA )   Clinic requested Courier to Provider Office   Delivery date: 04/28/24   Verified address: [address] [place] [address]   Medication will be filled on 04/25/24.

## 2024-04-25 ENCOUNTER — Other Ambulatory Visit: Payer: Self-pay

## 2024-04-28 ENCOUNTER — Telehealth: Payer: Self-pay

## 2024-04-28 NOTE — Telephone Encounter (Signed)
 [facility] Patient Advocate Encounter  Patient's medications Cabenuva  have been couriered to [facility] from [facility] and will be administered at the patients appointment on 04/30/24.  Thomas [name], CPhT Specialty Pharmacy Patient [facility] for Infectious Disease Phone: [phone] Fax:  [phone]

## 2024-04-29 ENCOUNTER — Ambulatory Visit: Payer: Self-pay | Admitting: Internal Medicine

## 2024-04-30 ENCOUNTER — Other Ambulatory Visit: Payer: Self-pay

## 2024-04-30 ENCOUNTER — Ambulatory Visit: Admitting: Internal Medicine

## 2024-04-30 ENCOUNTER — Encounter: Payer: Self-pay | Admitting: Internal Medicine

## 2024-04-30 VITALS — BP 121/80 | HR 84 | Temp 97.8°F | Wt 209.0 lb

## 2024-04-30 DIAGNOSIS — Z8571 Personal history of Hodgkin lymphoma: Secondary | ICD-10-CM

## 2024-04-30 DIAGNOSIS — Z23 Encounter for immunization: Secondary | ICD-10-CM

## 2024-04-30 DIAGNOSIS — B20 Human immunodeficiency virus [HIV] disease: Secondary | ICD-10-CM | POA: Diagnosis not present

## 2024-04-30 DIAGNOSIS — Z8613 Personal history of malaria: Secondary | ICD-10-CM

## 2024-04-30 DIAGNOSIS — Z79899 Other long term (current) drug therapy: Secondary | ICD-10-CM

## 2024-04-30 DIAGNOSIS — I2510 Atherosclerotic heart disease of native coronary artery without angina pectoris: Secondary | ICD-10-CM

## 2024-04-30 MED ORDER — CABOTEGRAVIR & RILPIVIRINE ER 600 & 900 MG/3ML IM SUER
1.0000 | Freq: Once | INTRAMUSCULAR | Status: AC
  Administered 2024-04-30: 1 via INTRAMUSCULAR

## 2024-04-30 NOTE — Progress Notes (Signed)
   Subjective:    Patient ID: Thomas [name], male    DOB: 1981-07-09, 43 y.o.   MRN: [id number]  HPI Thomas [name] is here for follow-up of HIV. He continues on Cabenuva  and denies any missed doses with that.  He has been compliant with his appointments and no issues with the injection sites.  He is pleased with the medication.  He denies any sexual activity.    04/30/24 id clinic f/u First visit with me Has been getting cabenuva  Well controlled No concern today  Has had 4 MI. On statin  [place] he is currently not smoking    Review of Systems  Constitutional:  Negative for fatigue.  Gastrointestinal:  Negative for diarrhea.       Objective:   Physical Exam Eyes:     General: No scleral icterus. Pulmonary:     Effort: Pulmonary effort is normal.  Neurological:     Mental Status: He is alert.   Cv: rrr no mrg Abd s/nt Skin:  no rash   Labs: Lab Results  Component Value Date   WBC 5.1 09/03/2023   HGB 15.3 09/03/2023   HCT 45.1 09/03/2023   MCV 97.2 09/03/2023   PLT 214 09/03/2023   Last metabolic panel Lab Results  Component Value Date   GLUCOSE 91 09/03/2023   NA 140 09/03/2023   K 4.2 09/03/2023   CL 108 09/03/2023   CO2 21 09/03/2023   BUN 15 09/03/2023   CREATININE 1.61 (H) 09/03/2023   EGFR 49 (L) 09/21/2022   CALCIUM  9.5 09/03/2023   PHOS 2.1 (L) 05/20/2014   PROT 7.6 09/03/2023   ALBUMIN 3.4 (L) 08/20/2021   LABGLOB 2.7 12/03/2018   AGRATIO 1.7 12/03/2018   BILITOT 0.4 09/03/2023   ALKPHOS 52 08/20/2021   AST 19 09/03/2023   ALT 18 09/03/2023   ANIONGAP 7 08/20/2021   Hiv: Lab Results  Component Value Date   HIV1RNAQUANT NOT DETECTED 02/26/2024   Lab Results  Component Value Date   CD4TCELL 47 02/26/2024   CD4TABS 686 02/26/2024            Assessment & Plan:   #hiv Dx'ed 2013 - viral load in millions with cd4 nadir > 200; heterosexual Hx oi - none Therapy: Started tx at dx 2013; atripla  --> raltegravir /truvada /etravirine   (brief) --> triumeq  --> cabenuva  04/2023   04/30/24 well controlled and would like to keep cabenuva .   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

## 2024-04-30 NOTE — Patient Instructions (Signed)
 Pleasure meeting you today   Please review anal cancer screening (google anal cancer hiv anchor study)   See you in 6 months    Review age of onset for colon cancer and also prostate cancer and discuss with your primary care provider about these  Make sure you do have a primary care provider otherwise

## 2024-05-27 ENCOUNTER — Inpatient Hospital Stay (HOSPITAL_COMMUNITY)

## 2024-05-27 ENCOUNTER — Other Ambulatory Visit: Payer: Self-pay

## 2024-05-27 ENCOUNTER — Inpatient Hospital Stay (HOSPITAL_COMMUNITY)
Admission: EM | Admit: 2024-05-27 | Discharge: 2024-06-05 | DRG: 001 | Disposition: A | Discharge status: Home Health | Attending: Thoracic Surgery (Cardiothoracic Vascular Surgery) | Admitting: Thoracic Surgery (Cardiothoracic Vascular Surgery)

## 2024-05-27 ENCOUNTER — Encounter (HOSPITAL_COMMUNITY): Payer: Self-pay

## 2024-05-27 ENCOUNTER — Emergency Department (HOSPITAL_COMMUNITY)

## 2024-05-27 ENCOUNTER — Inpatient Hospital Stay (HOSPITAL_COMMUNITY)
Admission: EM | Disposition: A | Discharge status: Home Health | Payer: Self-pay | Source: Home / Self Care | Attending: Thoracic Surgery (Cardiothoracic Vascular Surgery)

## 2024-05-27 DIAGNOSIS — W44F9XA Other object of natural or organic material, entering into or through a natural orifice, initial encounter: Secondary | ICD-10-CM | POA: Diagnosis not present

## 2024-05-27 DIAGNOSIS — Z01818 Encounter for other preprocedural examination: Secondary | ICD-10-CM

## 2024-05-27 DIAGNOSIS — I213 ST elevation (STEMI) myocardial infarction of unspecified site: Principal | ICD-10-CM

## 2024-05-27 DIAGNOSIS — T8111XA Postprocedural  cardiogenic shock, initial encounter: Secondary | ICD-10-CM | POA: Diagnosis present

## 2024-05-27 DIAGNOSIS — E440 Moderate protein-calorie malnutrition: Secondary | ICD-10-CM | POA: Diagnosis present

## 2024-05-27 DIAGNOSIS — D689 Coagulation defect, unspecified: Secondary | ICD-10-CM | POA: Diagnosis not present

## 2024-05-27 DIAGNOSIS — Z9911 Dependence on respirator [ventilator] status: Secondary | ICD-10-CM

## 2024-05-27 DIAGNOSIS — E785 Hyperlipidemia, unspecified: Secondary | ICD-10-CM | POA: Diagnosis not present

## 2024-05-27 DIAGNOSIS — J189 Pneumonia, unspecified organism: Secondary | ICD-10-CM | POA: Diagnosis not present

## 2024-05-27 DIAGNOSIS — Z886 Allergy status to analgesic agent status: Secondary | ICD-10-CM

## 2024-05-27 DIAGNOSIS — Z905 Acquired absence of kidney: Secondary | ICD-10-CM

## 2024-05-27 DIAGNOSIS — T17990A Other foreign object in respiratory tract, part unspecified in causing asphyxiation, initial encounter: Secondary | ICD-10-CM | POA: Diagnosis not present

## 2024-05-27 DIAGNOSIS — Z79899 Other long term (current) drug therapy: Secondary | ICD-10-CM

## 2024-05-27 DIAGNOSIS — R4589 Other symptoms and signs involving emotional state: Secondary | ICD-10-CM | POA: Diagnosis not present

## 2024-05-27 DIAGNOSIS — N1832 Chronic kidney disease, stage 3b: Secondary | ICD-10-CM | POA: Diagnosis present

## 2024-05-27 DIAGNOSIS — D62 Acute posthemorrhagic anemia: Secondary | ICD-10-CM | POA: Diagnosis not present

## 2024-05-27 DIAGNOSIS — D696 Thrombocytopenia, unspecified: Secondary | ICD-10-CM | POA: Diagnosis not present

## 2024-05-27 DIAGNOSIS — F419 Anxiety disorder, unspecified: Secondary | ICD-10-CM | POA: Diagnosis present

## 2024-05-27 DIAGNOSIS — I5021 Acute systolic (congestive) heart failure: Secondary | ICD-10-CM | POA: Diagnosis present

## 2024-05-27 DIAGNOSIS — Z7902 Long term (current) use of antithrombotics/antiplatelets: Secondary | ICD-10-CM | POA: Diagnosis not present

## 2024-05-27 DIAGNOSIS — I209 Angina pectoris, unspecified: Secondary | ICD-10-CM | POA: Diagnosis not present

## 2024-05-27 DIAGNOSIS — T17998A Other foreign object in respiratory tract, part unspecified causing other injury, initial encounter: Secondary | ICD-10-CM

## 2024-05-27 DIAGNOSIS — I252 Old myocardial infarction: Secondary | ICD-10-CM

## 2024-05-27 DIAGNOSIS — I2510 Atherosclerotic heart disease of native coronary artery without angina pectoris: Secondary | ICD-10-CM | POA: Diagnosis present

## 2024-05-27 DIAGNOSIS — Z8674 Personal history of sudden cardiac arrest: Secondary | ICD-10-CM | POA: Diagnosis not present

## 2024-05-27 DIAGNOSIS — Z951 Presence of aortocoronary bypass graft: Secondary | ICD-10-CM

## 2024-05-27 DIAGNOSIS — N1831 Chronic kidney disease, stage 3a: Secondary | ICD-10-CM | POA: Diagnosis not present

## 2024-05-27 DIAGNOSIS — N170 Acute kidney failure with tubular necrosis: Secondary | ICD-10-CM | POA: Diagnosis not present

## 2024-05-27 DIAGNOSIS — I501 Left ventricular failure: Secondary | ICD-10-CM | POA: Diagnosis not present

## 2024-05-27 DIAGNOSIS — Z7982 Long term (current) use of aspirin: Secondary | ICD-10-CM

## 2024-05-27 DIAGNOSIS — Z6825 Body mass index (BMI) 25.0-25.9, adult: Secondary | ICD-10-CM

## 2024-05-27 DIAGNOSIS — N189 Chronic kidney disease, unspecified: Secondary | ICD-10-CM | POA: Diagnosis not present

## 2024-05-27 DIAGNOSIS — N183 Chronic kidney disease, stage 3 unspecified: Secondary | ICD-10-CM | POA: Diagnosis not present

## 2024-05-27 DIAGNOSIS — N179 Acute kidney failure, unspecified: Secondary | ICD-10-CM

## 2024-05-27 DIAGNOSIS — Z87891 Personal history of nicotine dependence: Secondary | ICD-10-CM

## 2024-05-27 DIAGNOSIS — I25110 Atherosclerotic heart disease of native coronary artery with unstable angina pectoris: Secondary | ICD-10-CM

## 2024-05-27 DIAGNOSIS — B20 Human immunodeficiency virus [HIV] disease: Secondary | ICD-10-CM | POA: Diagnosis present

## 2024-05-27 DIAGNOSIS — R739 Hyperglycemia, unspecified: Secondary | ICD-10-CM | POA: Diagnosis not present

## 2024-05-27 DIAGNOSIS — R570 Cardiogenic shock: Secondary | ICD-10-CM | POA: Diagnosis not present

## 2024-05-27 DIAGNOSIS — J690 Pneumonitis due to inhalation of food and vomit: Secondary | ICD-10-CM | POA: Diagnosis not present

## 2024-05-27 DIAGNOSIS — D509 Iron deficiency anemia, unspecified: Secondary | ICD-10-CM | POA: Diagnosis not present

## 2024-05-27 DIAGNOSIS — Z515 Encounter for palliative care: Secondary | ICD-10-CM | POA: Diagnosis not present

## 2024-05-27 DIAGNOSIS — C8190 Hodgkin lymphoma, unspecified, unspecified site: Secondary | ICD-10-CM | POA: Diagnosis not present

## 2024-05-27 DIAGNOSIS — Z9281 Personal history of extracorporeal membrane oxygenation (ECMO): Secondary | ICD-10-CM | POA: Diagnosis not present

## 2024-05-27 DIAGNOSIS — Z833 Family history of diabetes mellitus: Secondary | ICD-10-CM | POA: Diagnosis not present

## 2024-05-27 DIAGNOSIS — J9811 Atelectasis: Secondary | ICD-10-CM | POA: Diagnosis not present

## 2024-05-27 DIAGNOSIS — Z8572 Personal history of non-Hodgkin lymphomas: Secondary | ICD-10-CM | POA: Diagnosis not present

## 2024-05-27 DIAGNOSIS — J9601 Acute respiratory failure with hypoxia: Secondary | ICD-10-CM | POA: Diagnosis not present

## 2024-05-27 DIAGNOSIS — I2102 ST elevation (STEMI) myocardial infarction involving left anterior descending coronary artery: Secondary | ICD-10-CM | POA: Diagnosis not present

## 2024-05-27 DIAGNOSIS — I25119 Atherosclerotic heart disease of native coronary artery with unspecified angina pectoris: Secondary | ICD-10-CM | POA: Diagnosis not present

## 2024-05-27 DIAGNOSIS — K567 Ileus, unspecified: Secondary | ICD-10-CM | POA: Diagnosis not present

## 2024-05-27 DIAGNOSIS — Z8571 Personal history of Hodgkin lymphoma: Secondary | ICD-10-CM

## 2024-05-27 DIAGNOSIS — K7200 Acute and subacute hepatic failure without coma: Secondary | ICD-10-CM | POA: Diagnosis not present

## 2024-05-27 DIAGNOSIS — Z7189 Other specified counseling: Secondary | ICD-10-CM | POA: Diagnosis not present

## 2024-05-27 DIAGNOSIS — I493 Ventricular premature depolarization: Secondary | ICD-10-CM | POA: Diagnosis not present

## 2024-05-27 DIAGNOSIS — I509 Heart failure, unspecified: Secondary | ICD-10-CM | POA: Diagnosis not present

## 2024-05-27 DIAGNOSIS — R7401 Elevation of levels of liver transaminase levels: Secondary | ICD-10-CM | POA: Diagnosis not present

## 2024-05-27 DIAGNOSIS — Z9221 Personal history of antineoplastic chemotherapy: Secondary | ICD-10-CM

## 2024-05-27 DIAGNOSIS — Z4509 Encounter for adjustment and management of other cardiac device: Secondary | ICD-10-CM | POA: Diagnosis not present

## 2024-05-27 HISTORY — PX: LEFT HEART CATH AND CORONARY ANGIOGRAPHY: CATH118249

## 2024-05-27 HISTORY — PX: CORONARY/GRAFT ACUTE MI REVASCULARIZATION: CATH118305

## 2024-05-27 HISTORY — PX: IABP INSERTION: CATH118242

## 2024-05-27 LAB — POCT I-STAT, CHEM 8
BUN: 19 mg/dL (ref 6–20)
Calcium, Ion: 1.2 mmol/L (ref 1.15–1.40)
Chloride: 109 mmol/L (ref 98–111)
Creatinine, Ser: 1.7 mg/dL — ABNORMAL HIGH (ref 0.61–1.24)
Glucose, Bld: 102 mg/dL — ABNORMAL HIGH (ref 70–99)
HCT: 40 % (ref 39.0–52.0)
Hemoglobin: 13.6 g/dL (ref 13.0–17.0)
Potassium: 3.4 mmol/L — ABNORMAL LOW (ref 3.5–5.1)
Sodium: 143 mmol/L (ref 135–145)
TCO2: 21 mmol/L — ABNORMAL LOW (ref 22–32)

## 2024-05-27 LAB — CBC WITH DIFFERENTIAL/PLATELET
Abs Immature Granulocytes: 0.01 K/uL (ref 0.00–0.07)
Abs Immature Granulocytes: 0.01 K/uL (ref 0.00–0.07)
Basophils Absolute: 0 K/uL (ref 0.0–0.1)
Basophils Absolute: 0 K/uL (ref 0.0–0.1)
Basophils Relative: 1 %
Basophils Relative: 1 %
Eosinophils Absolute: 0.1 K/uL (ref 0.0–0.5)
Eosinophils Absolute: 0 K/uL (ref 0.0–0.5)
Eosinophils Relative: 1 %
Eosinophils Relative: 0 %
HCT: 40.7 % (ref 39.0–52.0)
HCT: 44.5 % (ref 39.0–52.0)
Hemoglobin: 13.8 g/dL (ref 13.0–17.0)
Hemoglobin: 14.6 g/dL (ref 13.0–17.0)
Immature Granulocytes: 0 %
Immature Granulocytes: 0 %
Lymphocytes Relative: 39 %
Lymphocytes Relative: 37 %
Lymphs Abs: 2 K/uL (ref 0.7–4.0)
Lymphs Abs: 2.1 K/uL (ref 0.7–4.0)
MCH: 33.4 pg (ref 26.0–34.0)
MCH: 33 pg (ref 26.0–34.0)
MCHC: 33.9 g/dL (ref 30.0–36.0)
MCHC: 32.8 g/dL (ref 30.0–36.0)
MCV: 98.5 fL (ref 80.0–100.0)
MCV: 100.7 fL — ABNORMAL HIGH (ref 80.0–100.0)
Monocytes Absolute: 0.4 K/uL (ref 0.1–1.0)
Monocytes Absolute: 0.3 K/uL (ref 0.1–1.0)
Monocytes Relative: 7 %
Monocytes Relative: 6 %
Neutro Abs: 2.7 K/uL (ref 1.7–7.7)
Neutro Abs: 3.1 K/uL (ref 1.7–7.7)
Neutrophils Relative %: 52 %
Neutrophils Relative %: 56 %
Platelets: 187 K/uL (ref 150–400)
Platelets: 204 K/uL (ref 150–400)
RBC: 4.13 MIL/uL — ABNORMAL LOW (ref 4.22–5.81)
RBC: 4.42 MIL/uL (ref 4.22–5.81)
RDW: 12.9 % (ref 11.5–15.5)
RDW: 12.8 % (ref 11.5–15.5)
WBC: 5.2 K/uL (ref 4.0–10.5)
WBC: 5.6 K/uL (ref 4.0–10.5)
nRBC: 0 % (ref 0.0–0.2)
nRBC: 0 % (ref 0.0–0.2)

## 2024-05-27 LAB — COMPREHENSIVE METABOLIC PANEL WITH GFR
ALT: 22 U/L (ref 0–44)
ALT: 22 U/L (ref 0–44)
AST: 28 U/L (ref 15–41)
AST: 29 U/L (ref 15–41)
Albumin: 2.7 g/dL — ABNORMAL LOW (ref 3.5–5.0)
Albumin: 3 g/dL — ABNORMAL LOW (ref 3.5–5.0)
Alkaline Phosphatase: 43 U/L (ref 38–126)
Alkaline Phosphatase: 45 U/L (ref 38–126)
Anion gap: 12 (ref 5–15)
Anion gap: 12 (ref 5–15)
BUN: 18 mg/dL (ref 6–20)
BUN: 19 mg/dL (ref 6–20)
CO2: 21 mmol/L — ABNORMAL LOW (ref 22–32)
CO2: 23 mmol/L (ref 22–32)
Calcium: 8.3 mg/dL — ABNORMAL LOW (ref 8.9–10.3)
Calcium: 8.6 mg/dL — ABNORMAL LOW (ref 8.9–10.3)
Chloride: 107 mmol/L (ref 98–111)
Chloride: 107 mmol/L (ref 98–111)
Creatinine, Ser: 1.8 mg/dL — ABNORMAL HIGH (ref 0.61–1.24)
Creatinine, Ser: 1.8 mg/dL — ABNORMAL HIGH (ref 0.61–1.24)
GFR, Estimated: 48 mL/min — ABNORMAL LOW (ref >60)
GFR, Estimated: 48 mL/min — ABNORMAL LOW (ref >60)
Glucose, Bld: 104 mg/dL — ABNORMAL HIGH (ref 70–99)
Glucose, Bld: 123 mg/dL — ABNORMAL HIGH (ref 70–99)
Potassium: 3.2 mmol/L — ABNORMAL LOW (ref 3.5–5.1)
Potassium: 3.7 mmol/L (ref 3.5–5.1)
Sodium: 140 mmol/L (ref 135–145)
Sodium: 142 mmol/L (ref 135–145)
Total Bilirubin: 0.6 mg/dL (ref 0.0–1.2)
Total Bilirubin: 0.7 mg/dL (ref 0.0–1.2)
Total Protein: 6.2 g/dL — ABNORMAL LOW (ref 6.5–8.1)
Total Protein: 6.4 g/dL — ABNORMAL LOW (ref 6.5–8.1)

## 2024-05-27 LAB — ECHOCARDIOGRAM COMPLETE
Area-P 1/2: 6.54 cm2
Calc EF: 35.5 %
Height: 73 in
S' Lateral: 3.2 cm
Single Plane A2C EF: 35.4 %
Single Plane A4C EF: 35 %
Weight: 3344 [oz_av]

## 2024-05-27 LAB — TROPONIN I (HIGH SENSITIVITY)
Troponin I (High Sensitivity): 2686 ng/L (ref <18)
Troponin I (High Sensitivity): 3318 ng/L (ref <18)

## 2024-05-27 LAB — POCT I-STAT 7, (LYTES, BLD GAS, ICA,H+H)
Acid-base deficit: 3 mmol/L — ABNORMAL HIGH (ref 0.0–2.0)
Bicarbonate: 21.5 mmol/L (ref 20.0–28.0)
Calcium, Ion: 1.19 mmol/L (ref 1.15–1.40)
HCT: 38 % — ABNORMAL LOW (ref 39.0–52.0)
Hemoglobin: 12.9 g/dL — ABNORMAL LOW (ref 13.0–17.0)
O2 Saturation: 98 %
Potassium: 3.3 mmol/L — ABNORMAL LOW (ref 3.5–5.1)
Sodium: 142 mmol/L (ref 135–145)
TCO2: 23 mmol/L (ref 22–32)
pCO2 arterial: 36.3 mmHg (ref 32–48)
pH, Arterial: 7.38 (ref 7.35–7.45)
pO2, Arterial: 103 mmHg (ref 83–108)

## 2024-05-27 LAB — LIPID PANEL
Cholesterol: 198 mg/dL (ref 0–200)
Cholesterol: 195 mg/dL (ref 0–200)
HDL: 39 mg/dL — ABNORMAL LOW (ref >40)
HDL: 46 mg/dL (ref >40)
LDL Cholesterol: 130 mg/dL — ABNORMAL HIGH (ref 0–99)
LDL Cholesterol: 126 mg/dL — ABNORMAL HIGH (ref 0–99)
Total CHOL/HDL Ratio: 5.1 ratio
Total CHOL/HDL Ratio: 4.2 ratio
Triglycerides: 144 mg/dL (ref <150)
Triglycerides: 113 mg/dL (ref <150)
VLDL: 29 mg/dL (ref 0–40)
VLDL: 23 mg/dL (ref 0–40)

## 2024-05-27 LAB — HEPARIN LEVEL (UNFRACTIONATED): Heparin Unfractionated: 0.12 [IU]/mL — ABNORMAL LOW (ref 0.30–0.70)

## 2024-05-27 LAB — POCT ACTIVATED CLOTTING TIME
Activated Clotting Time: 233 s
Activated Clotting Time: 320 s
Activated Clotting Time: 239 s

## 2024-05-27 LAB — HEMOGLOBIN A1C
Hgb A1c MFr Bld: 5.2 % (ref 4.8–5.6)
Hgb A1c MFr Bld: 5.2 % (ref 4.8–5.6)
Mean Plasma Glucose: 102.54 mg/dL
Mean Plasma Glucose: 102.54 mg/dL

## 2024-05-27 LAB — APTT
aPTT: 29 s (ref 24–36)
aPTT: 31 s (ref 24–36)

## 2024-05-27 LAB — PROTIME-INR
INR: 1 (ref 0.8–1.2)
INR: 1 (ref 0.8–1.2)
Prothrombin Time: 13.8 s (ref 11.4–15.2)
Prothrombin Time: 14.3 s (ref 11.4–15.2)

## 2024-05-27 LAB — CG4 I-STAT (LACTIC ACID)
Lactic Acid, Venous: 0.8 mmol/L (ref 0.5–1.9)
Lactic Acid, Venous: 0.6 mmol/L (ref 0.5–1.9)

## 2024-05-27 LAB — LACTIC ACID, PLASMA: Lactic Acid, Venous: 1.4 mmol/L (ref 0.5–1.9)

## 2024-05-27 LAB — PREPARE RBC (CROSSMATCH)

## 2024-05-27 LAB — MRSA NEXT GEN BY PCR, NASAL: MRSA by PCR Next Gen: NOT DETECTED

## 2024-05-27 SURGERY — LEFT HEART CATH AND CORONARY ANGIOGRAPHY
Anesthesia: LOCAL

## 2024-05-27 MED ORDER — ASPIRIN 81 MG PO TBEC: 81.0000 mg | DELAYED_RELEASE_TABLET | Freq: Every day | ORAL | Status: DC

## 2024-05-27 MED ORDER — INSULIN REGULAR(HUMAN) IN NACL 100-0.9 UT/100ML-% IV SOLN
INTRAVENOUS | Status: AC
  Administered 2024-05-28: .8 [IU]/h via INTRAVENOUS
  Filled 2024-05-27: qty 100

## 2024-05-27 MED ORDER — SODIUM CHLORIDE 0.9% FLUSH
3.0000 mL | Freq: Two times a day (BID) | INTRAVENOUS | Status: DC
  Administered 2024-05-27: 3 mL via INTRAVENOUS

## 2024-05-27 MED ORDER — VANCOMYCIN HCL 1.5 G IV SOLR
1500.0000 mg | INTRAVENOUS | Status: AC
  Administered 2024-05-28: 1500 mg via INTRAVENOUS
  Filled 2024-05-27: qty 30

## 2024-05-27 MED ORDER — CHLORHEXIDINE GLUCONATE 0.12 % MT SOLN
15.0000 mL | Freq: Once | OROMUCOSAL | Status: AC
  Administered 2024-05-28: 15 mL via OROMUCOSAL
  Filled 2024-05-27: qty 15

## 2024-05-27 MED ORDER — HEPARIN (PORCINE) IN NACL 2-0.9 UNITS/ML
INTRAMUSCULAR | Status: DC | PRN
  Administered 2024-05-27: 10 mL via INTRA_ARTERIAL

## 2024-05-27 MED ORDER — SODIUM CHLORIDE 0.9 % IV SOLN
INTRAVENOUS | Status: AC | PRN
Start: 2024-05-27 — End: 2024-05-27
  Administered 2024-05-27: 50 mL/h via INTRAVENOUS
  Administered 2024-05-27: 10 mL/h via INTRAVENOUS

## 2024-05-27 MED ORDER — HEPARIN SODIUM (PORCINE) 1000 UNIT/ML IJ SOLN
INTRAMUSCULAR | Status: AC
  Filled 2024-05-27: qty 10

## 2024-05-27 MED ORDER — NOREPINEPHRINE 4 MG/250ML-% IV SOLN
INTRAVENOUS | Status: AC
  Filled 2024-05-27: qty 250

## 2024-05-27 MED ORDER — IOHEXOL 350 MG/ML SOLN
INTRAVENOUS | Status: DC | PRN
Start: 2024-05-27 — End: 2024-05-27
  Administered 2024-05-27: 50 mL

## 2024-05-27 MED ORDER — CEFAZOLIN SODIUM-DEXTROSE 2-4 GM/100ML-% IV SOLN
2.0000 g | INTRAVENOUS | Status: DC
  Filled 2024-05-27: qty 100

## 2024-05-27 MED ORDER — SODIUM CHLORIDE 0.9 % IV SOLN: 250.0000 mL | INTRAVENOUS | Status: DC | PRN

## 2024-05-27 MED ORDER — SODIUM CHLORIDE 0.9 % IV SOLN: 250.0000 mL | INTRAVENOUS | Status: DC

## 2024-05-27 MED ORDER — MILRINONE LACTATE IN DEXTROSE 20-5 MG/100ML-% IV SOLN
0.3000 ug/kg/min | INTRAVENOUS | Status: AC
  Administered 2024-05-28: .375 ug/kg/min via INTRAVENOUS
  Filled 2024-05-27: qty 100

## 2024-05-27 MED ORDER — PHENYLEPHRINE 80 MCG/ML (10ML) SYRINGE FOR IV PUSH (FOR BLOOD PRESSURE SUPPORT)
PREFILLED_SYRINGE | INTRAVENOUS | Status: AC
Start: 2024-05-27 — End: 2024-05-27
  Filled 2024-05-27: qty 10

## 2024-05-27 MED ORDER — HEPARIN SODIUM (PORCINE) 1000 UNIT/ML IJ SOLN
INTRAMUSCULAR | Status: DC | PRN
  Administered 2024-05-27: 2000 [IU] via INTRAVENOUS
  Administered 2024-05-27: 10000 [IU] via INTRAVENOUS

## 2024-05-27 MED ORDER — METOPROLOL TARTRATE 12.5 MG HALF TABLET
12.5000 mg | ORAL_TABLET | Freq: Once | ORAL | Status: AC
  Administered 2024-05-28: 12.5 mg via ORAL
  Filled 2024-05-27: qty 1

## 2024-05-27 MED ORDER — HEPARIN (PORCINE) 25000 UT/250ML-% IV SOLN
1250.0000 [IU]/h | INTRAVENOUS | Status: DC
  Administered 2024-05-27: 1000 [IU]/h via INTRAVENOUS
  Filled 2024-05-27: qty 250

## 2024-05-27 MED ORDER — PHENYLEPHRINE HCL-NACL 20-0.9 MG/250ML-% IV SOLN
30.0000 ug/min | INTRAVENOUS | Status: AC
Start: 2024-05-28 — End: 2024-05-29
  Administered 2024-05-28 (×2): 25 ug/min via INTRAVENOUS
  Filled 2024-05-27: qty 250

## 2024-05-27 MED ORDER — HYDRALAZINE HCL 20 MG/ML IJ SOLN: 10.0000 mg | INTRAMUSCULAR | Status: AC | PRN

## 2024-05-27 MED ORDER — PLASMA-LYTE A IV SOLN
INTRAVENOUS | Status: DC
  Filled 2024-05-27: qty 2.5

## 2024-05-27 MED ORDER — CEFAZOLIN SODIUM-DEXTROSE 2-4 GM/100ML-% IV SOLN
2.0000 g | INTRAVENOUS | Status: AC
  Administered 2024-05-28 (×2): 2 g via INTRAVENOUS
  Filled 2024-05-27: qty 100

## 2024-05-27 MED ORDER — SODIUM CHLORIDE 0.9% FLUSH: 3.0000 mL | INTRAVENOUS | Status: DC | PRN

## 2024-05-27 MED ORDER — POTASSIUM CHLORIDE 2 MEQ/ML IV SOLN
80.0000 meq | INTRAVENOUS | Status: DC
  Filled 2024-05-27: qty 40

## 2024-05-27 MED ORDER — TRANEXAMIC ACID 1000 MG/10ML IV SOLN
1.5000 mg/kg/h | INTRAVENOUS | Status: AC
  Administered 2024-05-28: 1.5 mg/kg/h via INTRAVENOUS
  Filled 2024-05-27: qty 25

## 2024-05-27 MED ORDER — SODIUM CHLORIDE 0.9 % IV SOLN: INTRAVENOUS | Status: AC

## 2024-05-27 MED ORDER — ATORVASTATIN CALCIUM 40 MG PO TABS
40.0000 mg | ORAL_TABLET | Freq: Every day | ORAL | Status: DC
  Administered 2024-05-27 – 2024-06-01 (×2): 40 mg via ORAL
  Filled 2024-05-27 (×3): qty 1

## 2024-05-27 MED ORDER — NOREPINEPHRINE 4 MG/250ML-% IV SOLN
0.0000 ug/min | INTRAVENOUS | Status: AC
  Administered 2024-05-28: 4 ug/min via INTRAVENOUS
  Filled 2024-05-27: qty 250

## 2024-05-27 MED ORDER — FENTANYL CITRATE (PF) 100 MCG/2ML IJ SOLN
INTRAMUSCULAR | Status: DC | PRN
  Administered 2024-05-27 (×3): 25 ug via INTRAVENOUS

## 2024-05-27 MED ORDER — HEPARIN (PORCINE) IN NACL 1000-0.9 UT/500ML-% IV SOLN
INTRAVENOUS | Status: DC | PRN
Start: 2024-05-27 — End: 2024-05-27
  Administered 2024-05-27 (×4): 500 mL

## 2024-05-27 MED ORDER — MIDAZOLAM HCL 2 MG/2ML IJ SOLN
INTRAMUSCULAR | Status: AC
  Filled 2024-05-27: qty 2

## 2024-05-27 MED ORDER — LIDOCAINE HCL (PF) 1 % IJ SOLN
INTRAMUSCULAR | Status: AC
  Filled 2024-05-27: qty 30

## 2024-05-27 MED ORDER — HEPARIN 30,000 UNITS/1000 ML (OHS) CELLSAVER SOLUTION
Status: DC
  Filled 2024-05-27: qty 1000

## 2024-05-27 MED ORDER — DEXMEDETOMIDINE HCL IN NACL 400 MCG/100ML IV SOLN
0.1000 ug/kg/h | INTRAVENOUS | Status: AC
Start: 2024-05-28 — End: 2024-05-29
  Administered 2024-05-28: .7 ug/kg/h via INTRAVENOUS
  Filled 2024-05-27: qty 100

## 2024-05-27 MED ORDER — HEPARIN SODIUM (PORCINE) 1000 UNIT/ML IJ SOLN
INTRAMUSCULAR | Status: AC
Start: 2024-05-27 — End: 2024-05-27
  Filled 2024-05-27: qty 10

## 2024-05-27 MED ORDER — NOREPINEPHRINE 4 MG/250ML-% IV SOLN: 0.0000 ug/min | INTRAVENOUS | Status: DC

## 2024-05-27 MED ORDER — VERAPAMIL HCL 2.5 MG/ML IV SOLN
INTRAVENOUS | Status: AC
Start: 2024-05-27 — End: 2024-05-27
  Filled 2024-05-27: qty 2

## 2024-05-27 MED ORDER — POTASSIUM CHLORIDE CRYS ER 20 MEQ PO TBCR
40.0000 meq | EXTENDED_RELEASE_TABLET | Freq: Once | ORAL | Status: AC
  Administered 2024-05-27: 40 meq via ORAL
  Filled 2024-05-27: qty 2

## 2024-05-27 MED ORDER — NITROGLYCERIN 1 MG/10 ML FOR IR/CATH LAB
INTRA_ARTERIAL | Status: AC
  Filled 2024-05-27: qty 10

## 2024-05-27 MED ORDER — CHLORHEXIDINE GLUCONATE CLOTH 2 % EX PADS
6.0000 | MEDICATED_PAD | Freq: Once | CUTANEOUS | Status: DC
Start: 2024-05-27 — End: 2024-05-28

## 2024-05-27 MED ORDER — LIDOCAINE HCL (PF) 1 % IJ SOLN
INTRAMUSCULAR | Status: DC | PRN
  Administered 2024-05-27: 15 mL via INTRADERMAL
  Administered 2024-05-27: 2 mL via INTRADERMAL

## 2024-05-27 MED ORDER — TEMAZEPAM 7.5 MG PO CAPS: 15.0000 mg | ORAL_CAPSULE | Freq: Once | ORAL | Status: DC | PRN

## 2024-05-27 MED ORDER — MANNITOL 20 % IV SOLN
INTRAVENOUS | Status: DC
  Filled 2024-05-27: qty 13

## 2024-05-27 MED ORDER — EPINEPHRINE HCL 5 MG/250ML IV SOLN IN NS
0.0000 ug/min | INTRAVENOUS | Status: AC
Start: 2024-05-28 — End: 2024-05-29
  Administered 2024-05-28: 2 ug/min via INTRAVENOUS
  Filled 2024-05-27: qty 250

## 2024-05-27 MED ORDER — TRANEXAMIC ACID (OHS) BOLUS VIA INFUSION
15.0000 mg/kg | INTRAVENOUS | Status: AC
  Administered 2024-05-28: 1422 mg via INTRAVENOUS
  Filled 2024-05-27: qty 1422

## 2024-05-27 MED ORDER — FENTANYL CITRATE (PF) 100 MCG/2ML IJ SOLN
INTRAMUSCULAR | Status: AC
  Filled 2024-05-27: qty 2

## 2024-05-27 MED ORDER — TRANEXAMIC ACID (OHS) PUMP PRIME SOLUTION
2.0000 mg/kg | INTRAVENOUS | Status: DC
  Filled 2024-05-27: qty 1.9

## 2024-05-27 MED ORDER — NOREPINEPHRINE BITARTRATE 1 MG/ML IV SOLN
INTRAVENOUS | Status: AC | PRN
  Administered 2024-05-27: 2.5 ug/min via INTRAVENOUS

## 2024-05-27 MED ORDER — BISACODYL 5 MG PO TBEC
5.0000 mg | DELAYED_RELEASE_TABLET | Freq: Once | ORAL | Status: DC
Start: 2024-05-27 — End: 2024-05-28

## 2024-05-27 MED ORDER — ONDANSETRON HCL 4 MG/2ML IJ SOLN: 4.0000 mg | Freq: Four times a day (QID) | INTRAMUSCULAR | Status: DC | PRN

## 2024-05-27 MED ORDER — LABETALOL HCL 5 MG/ML IV SOLN: 10.0000 mg | INTRAVENOUS | Status: AC | PRN

## 2024-05-27 MED ORDER — PHENYLEPHRINE 80 MCG/ML (10ML) SYRINGE FOR IV PUSH (FOR BLOOD PRESSURE SUPPORT)
PREFILLED_SYRINGE | INTRAVENOUS | Status: DC | PRN
  Administered 2024-05-27: 80 ug via INTRAVENOUS

## 2024-05-27 MED ORDER — CHLORHEXIDINE GLUCONATE CLOTH 2 % EX PADS
6.0000 | MEDICATED_PAD | Freq: Once | CUTANEOUS | Status: AC
  Administered 2024-05-27: 6 via TOPICAL

## 2024-05-27 MED ORDER — MIDAZOLAM HCL (PF) 2 MG/2ML IJ SOLN
INTRAMUSCULAR | Status: DC | PRN
Start: 2024-05-27 — End: 2024-05-27
  Administered 2024-05-27 (×2): 1 mg via INTRAVENOUS

## 2024-05-27 MED ORDER — NITROGLYCERIN IN D5W 200-5 MCG/ML-% IV SOLN
2.0000 ug/min | INTRAVENOUS | Status: DC
Start: 2024-05-28 — End: 2024-05-29
  Filled 2024-05-27: qty 250

## 2024-05-27 SURGICAL SUPPLY — 21 items
BALLOON EMERGE MR 2.0X8 (BALLOONS) IMPLANT
BALLOON EMERGE MR 2.5X12 (BALLOONS) IMPLANT
BALLOON IABP SENS PLUS 8F 50CC (BALLOONS) IMPLANT
CATH INFINITI AMBI 5FR TG (CATHETERS) IMPLANT
CATH LAUNCHER 6FR EBU 3 (CATHETERS) IMPLANT
CATH LAUNCHER 6FR EBU3.5 (CATHETERS) IMPLANT
DEVICE RAD COMP TR BAND LRG (VASCULAR PRODUCTS) IMPLANT
ELECT DEFIB PAD ADLT CADENCE (PAD) IMPLANT
GLIDESHEATH SLEND SS 6F .021 (SHEATH) IMPLANT
GUIDEWIRE INQWIRE 1.5J.035X260 (WIRE) IMPLANT
KIT ENCORE 26 ADVANTAGE (KITS) IMPLANT
KIT HEMO VALVE WATCHDOG (MISCELLANEOUS) IMPLANT
KIT MICROPUNCTURE NIT STIFF (SHEATH) IMPLANT
KIT SYRINGE INJ CVI SPIKEX1 (MISCELLANEOUS) IMPLANT
PACK CARDIAC CATHETERIZATION (CUSTOM PROCEDURE TRAY) ×1 IMPLANT
SET ATX-X65L (MISCELLANEOUS) IMPLANT
SHEATH PINNACLE 5F 10CM (SHEATH) IMPLANT
SHEATH PINNACLE 7F 10CM (SHEATH) IMPLANT
SHEATH PINNACLE 8F 10CM (SHEATH) IMPLANT
SHEATH PROBE COVER 6X72 (BAG) IMPLANT
WIRE RUNTHROUGH .014X180CM (WIRE) IMPLANT

## 2024-05-27 NOTE — ED Triage Notes (Signed)
 Patient BIB EMS from home c/o chest pain for 30 mins to an hour prior to the call. Patient also c/o [facility] with exertion. Patient has cardiac history and is noncompliant. Patient does see an ID doctor normally. Patient had evidence of STEMI upon EMS EKG. Patient given 324 of aspirin  and 1 of nitroglycerin  prior to arrival. Pain was relieved with those meds.

## 2024-05-27 NOTE — Plan of Care (Signed)

## 2024-05-27 NOTE — Progress Notes (Addendum)
 Late entry.  Patient was seen in the ICU just after returning from the cath lab.   Presented with lateral STEMI and underwent PTCA to ostial LAD. Also has occluded m LAD and occluded OM1. IABP placed for stabilization for CABG.  LVEDP 14 in lab.  Echo pending.  IABP at 1:1. MAP > 70 on 2.5 NE (arrived from lab on this dose). Wean NE as able. Palpable R pedal pulses. CXR with stable IABP position.  Trend lactic acid and check CMET in a couple of hours.   Comfortable at rest. No complaints of angina.   TCTS consulted for consideration of CABG.  Manuelita Dutch, PA-C Advanced Heart Failure

## 2024-05-27 NOTE — ED Provider Notes (Signed)
 [place] EMERGENCY DEPARTMENT AT [facility] Provider Note   CSN: [id number] Arrival date & time: 05/27/24  9292     Patient presents with: Code STEMI   Thomas [name] is a 43 y.o. male.   HPI Patient presents with chest pain.  Presented as a code STEMI called by EMS.  Has pain but he woke up this morning but has been having some chest pain.  Anterior chest.  Has previous 4 previous heart attacks per the patient.  Had been given aspirin  and nitroglycerin  by EMS with pain relief.  No pain at this time.  Has had no bleeding.   Past Medical History:  Diagnosis Date   Acute respiratory failure with hypoxemia: post cardiac arrest 05/19/2014   Cancer (HCC)    hodgkins, no current treatment for   Cardiac arrest (HCC) 05/15/2014   Chronic renal insufficiency    CKD (chronic kidney disease) s/p Right nephrectomy (04/30/2014) 05/19/2014   CLABSI (central line-associated bloodstream infection) 02/27/2014   Enteritis 01/22/2014   History of blood transfusion july 2015 per pt   HIV disease (HCC) 06/25/2012   Hydronephrosis, right & hydroureter: secondary to obstructing ureteral calculi 03/01/2014   Leg pain 07/03/2012   Nephrolithiasis    NSTEMI (non-ST elevated myocardial infarction) (HCC) 05/19/2014   Obstructive uropathy 12/12/2014   Status post left renal stent   Penile cyst 08/06/2012   Septic shock (HCC) 01/18/2014   Shingles rash 02/25/2014   Sinus tachycardia 08/06/2012   Skin lesion 07/03/2012   Staphylococcus aureus bacteremia 02/25/2014   Transaminitis 05/19/2014    Prior to Admission medications   Medication Sig Start Date End Date Taking? Authorizing Provider  ASPIR-LOW 81 MG EC tablet TAKE ONE TABLET BY MOUTH DAILY 02/18/21   Ladona Heinz, MD  atorvastatin  (LIPITOR ) 40 MG tablet TAKE ONE TABLET BY MOUTH DAILY 03/24/20   Ladona Heinz, MD  cabotegravir  & rilpivirine  ER (CABENUVA ) 600 & 900 MG/3ML injection Inject 1 kit into the muscle every 2 (two) months. 08/13/23   Waddell [name] PARAS, RPH-CPP  cyclobenzaprine  (FLEXERIL ) 10 MG tablet Take 1 tablet (10 mg total) by mouth 2 (two) times daily as needed for muscle spasms. 05/16/20   Roselyn Carlin NOVAK, MD  metoprolol  succinate (TOPROL -XL) 25 MG 24 hr tablet TAKE ONE TABLET BY MOUTH DAILY WITH OR IMMEDIATELY FOLLOWING A MEAL 02/18/21   Ladona Heinz, MD  nitroGLYCERIN  (NITROSTAT ) 0.4 MG SL tablet Place 1 tablet (0.4 mg total) under the tongue every 5 (five) minutes as needed for up to 25 days for chest pain. 05/19/19 04/30/24  Ladona Heinz, MD    Allergies: Tylenol  [acetaminophen ]    Review of Systems  Updated Vital Signs SpO2 98%   Physical Exam Vitals and nursing note reviewed.  Abdominal:     Tenderness: There is no abdominal tenderness.  Musculoskeletal:     Right lower leg: No edema.     Left lower leg: No edema.  Skin:    Capillary Refill: Capillary refill takes less than 2 seconds.  Neurological:     Mental Status: He is alert and oriented to person, place, and time.     (all labs ordered are listed, but only abnormal results are displayed) Labs Reviewed  HEMOGLOBIN A1C  CBC WITH DIFFERENTIAL/PLATELET  PROTIME-INR  APTT  COMPREHENSIVE METABOLIC PANEL WITH GFR  LIPID PANEL  I-STAT CG4 LACTIC ACID, ED  TROPONIN I (HIGH SENSITIVITY)    EKG: EKG Interpretation Date/Time:  Tuesday May 27 2024 07:12:18 EST Ventricular Rate:  99 PR Interval:  176 QRS Duration:  85 QT Interval:  355 QTC Calculation: 456 R Axis:   31  Text Interpretation: Sinus rhythm Abnormal R-wave progression, early transition Repol abnrm suggests ischemia, diffuse leads Confirmed by Patsey Lot [phone]) on 05/27/2024 7:26:27 AM  Radiology: No results found.   Procedures   Medications Ordered in the ED  nitroGLYCERIN  100 mcg/mL intra-arterial injection (has no administration in time range)                                    Medical Decision Making Amount and/or Complexity of Data Reviewed Labs:  ordered.  Risk Decision regarding hospitalization.   Patient presented with chest pain.  Anterior chest.  Prehospital EKG does show diffuse ST depression and potentially some ST elevation in aVR.  It has changed and normalized upon arrival in the ER with pain relief.  However does have high risk features and dynamic changes.  Dr. Elmira came to the ER and will take to the Cath Lab.  CRITICAL CARE Performed by: Lot Patsey Total critical care time: 30 minutes Critical care time was exclusive of separately billable procedures and treating other patients. Critical care was necessary to treat or prevent imminent or life-threatening deterioration. Critical care was time spent personally by me on the following activities: development of treatment plan with patient and/or surrogate as well as nursing, discussions with consultants, evaluation of patient's response to treatment, examination of patient, obtaining history from patient or surrogate, ordering and performing treatments and interventions, ordering and review of laboratory studies, ordering and review of radiographic studies, pulse oximetry and re-evaluation of patient's condition.      Final diagnoses:  ST elevation myocardial infarction (STEMI), unspecified artery [facility])    ED Discharge Orders     None          Patsey Lot, MD 05/27/24 [phone]

## 2024-05-27 NOTE — Progress Notes (Signed)
 PHARMACY - ANTICOAGULATION CONSULT NOTE  Pharmacy Consult for heparin  Indication: IABP in place  Allergies  Allergen Reactions   Tylenol  [Acetaminophen ] Other (See Comments)    Cold sweats    Patient Measurements: Height: 6' 1 (185.4 cm) Weight: 94.8 kg (209 lb) IBW/kg (Calculated) : 79.9 HEPARIN  DW (KG): 94.8  Vital Signs: Temp: 98.9 F (37.2 C) (11/04 1749) Temp Source: Oral (11/04 1749) BP: 104/77 (11/04 1900) Pulse Rate: 270 (11/04 1900)  Labs: Recent Labs    05/27/24 0740 05/27/24 0743 05/27/24 0748 05/27/24 1139 05/27/24 1747  HGB 13.8 13.6 12.9* 14.6  --   HCT 40.7 40.0 38.0* 44.5  --   PLT 187  --   --  204  --   APTT 29  --   --  31  --   LABPROT 13.8  --   --  14.3  --   INR 1.0  --   --  1.0  --   HEPARINUNFRC  --   --   --   --  0.12*  CREATININE 1.80* 1.70*  --  1.80*  --   TROPONINIHS 2,686*  --   --  3,318*  --     Estimated Creatinine Clearance: 61 mL/min (A) (by C-G formula based on SCr of 1.8 mg/dL (H)).    Assessment: 42yo male presented with chest pain and found to have STEMI. IABP placed, heparin  to bridge until planned CABG 11/5. Of note, patient received heparin  boluses during procedure. CBC stable.  Heparin  level subtherapeutic (0.12) on infusion at 1000 units/hr. No issues with line or bleeding reported per RN.  Goal of Therapy:  Heparin  level 0.2-0.5 units/ml Monitor platelets by anticoagulation protocol: Yes  Plan:  Increase heparin  infusion to 1150 units/hr F/u a.m. heparin  level - plan heparin  to turn off on call to OR 11/5  Vito Ralph, PharmD, BCPS Please see amion for complete clinical pharmacist phone list 05/27/2024 7:41 PM

## 2024-05-27 NOTE — Progress Notes (Signed)
 PHARMACY - ANTICOAGULATION CONSULT NOTE  Pharmacy Consult for heparin  Indication: IABP in place  Allergies  Allergen Reactions   Tylenol  [Acetaminophen ] Other (See Comments)    Cold sweats    Patient Measurements: Height: 6' 1 (185.4 cm) Weight: 94.8 kg (209 lb) IBW/kg (Calculated) : 79.9 HEPARIN  DW (KG): 94.8  Vital Signs: Temp: 97.5 F (36.4 C) (11/04 1151) Temp Source: Oral (11/04 1151) BP: 122/91 (11/04 1545) Pulse Rate: 83 (11/04 1000)  Labs: Recent Labs    05/27/24 0740 05/27/24 0743 05/27/24 0748 05/27/24 1139  HGB 13.8 13.6 12.9* 14.6  HCT 40.7 40.0 38.0* 44.5  PLT 187  --   --  204  APTT 29  --   --  31  LABPROT 13.8  --   --  14.3  INR 1.0  --   --  1.0  CREATININE 1.80* 1.70*  --  1.80*  TROPONINIHS 2,686*  --   --  3,318*    Estimated Creatinine Clearance: 61 mL/min (A) (by C-G formula based on SCr of 1.8 mg/dL (H)).   Medical History: Past Medical History:  Diagnosis Date   Acute respiratory failure with hypoxemia: post cardiac arrest 05/19/2014   Cancer (HCC)    hodgkins, no current treatment for   Cardiac arrest (HCC) 05/15/2014   Chronic renal insufficiency    CKD (chronic kidney disease) s/p Right nephrectomy (04/30/2014) 05/19/2014   CLABSI (central line-associated bloodstream infection) 02/27/2014   Enteritis 01/22/2014   History of blood transfusion july 2015 per pt   HIV disease (HCC) 06/25/2012   Hydronephrosis, right & hydroureter: secondary to obstructing ureteral calculi 03/01/2014   Leg pain 07/03/2012   Nephrolithiasis    NSTEMI (non-ST elevated myocardial infarction) (HCC) 05/19/2014   Obstructive uropathy 12/12/2014   Status post left renal stent   Penile cyst 08/06/2012   Septic shock (HCC) 01/18/2014   Shingles rash 02/25/2014   Sinus tachycardia 08/06/2012   Skin lesion 07/03/2012   Staphylococcus aureus bacteremia 02/25/2014   Transaminitis 05/19/2014    Medications:  Scheduled:   [START ON 05/28/2024] aspirin  EC  81 mg Oral  Daily   atorvastatin   40 mg Oral Daily   bisacodyl   5 mg Oral Once   [START ON 05/28/2024] chlorhexidine   15 mL Mouth/Throat Once   Chlorhexidine  Gluconate Cloth  6 each Topical Once   [START ON 05/28/2024] epinephrine   0-10 mcg/min Intravenous To OR   [START ON 05/28/2024] heparin  sodium (porcine) 2,500 Units, papaverine 30 mg in electrolyte-A (PLASMALYTE-A PH 7.4) 500 mL irrigation   Irrigation To OR   [START ON 05/28/2024] insulin    Intravenous To OR   [START ON 05/28/2024] [place] Blood Cardioplegia vial (lidocaine /magnesium /mannitol 0.26g-4g-6.4g)   Intracoronary To OR   [START ON 05/28/2024] metoprolol  tartrate  12.5 mg Oral Once   nitroGLYCERIN        [START ON 05/28/2024] phenylephrine   30-200 mcg/min Intravenous To OR   [START ON 05/28/2024] potassium chloride   80 mEq Other To OR   sodium chloride  flush  3 mL Intravenous Q12H   [START ON 05/28/2024] tranexamic acid  15 mg/kg Intravenous To OR   [START ON 05/28/2024] tranexamic acid  2 mg/kg Intracatheter To OR   Infusions:   sodium chloride      sodium chloride  10 mL/hr at 05/27/24 1600   [START ON 05/28/2024]  ceFAZolin  (ANCEF ) IV     [START ON 05/28/2024]  ceFAZolin  (ANCEF ) IV     [START ON 05/28/2024] dexmedetomidine     [START ON 05/28/2024] heparin  30,000  units/NS 1000 mL solution for CELLSAVER     heparin  1,000 Units/hr (05/27/24 1600)   [START ON 05/28/2024] milrinone     [START ON 05/28/2024] nitroGLYCERIN      norepinephrine  (LEVOPHED ) Adult infusion Stopped (05/27/24 1332)   [START ON 05/28/2024] norepinephrine      [START ON 05/28/2024] tranexamic acid (CYKLOKAPRON) 2,500 mg in sodium chloride  0.9 % 250 mL (10 mg/mL) infusion     [START ON 05/28/2024] vancomycin       Assessment: 43yo male presented with chest pain and found to have STEMI. IABP placed, heparin  to bridge until planned CABG 11/5. Of note, patient received heparin  boluses during procedure. CBC stable.  Goal of Therapy:  Heparin  level 0.2-0.5 Monitor platelets by  anticoagulation protocol: Yes  Plan:  Start heparin  infusion at 1000 units/hr Continue to monitor H&H and platelets Check heparin  level in 6 hours Titrate slowly, plan heparin  to turn off on call to OR 11/5   Dionicia Canavan, PharmD, RPh PGY1 Acute Care Pharmacy Resident [facility]  05/27/2024 4:49 PM

## 2024-05-27 NOTE — Progress Notes (Signed)
  Echocardiogram 2D Echocardiogram has been performed.  Devora Ellouise SAUNDERS 05/27/2024, 10:09 AM

## 2024-05-27 NOTE — H&P (Signed)
 Cardiology Admission History and Physical   Patient ID: Thomas [name] MRN: [id number]; DOB: 1981/01/19   Admission date: 05/27/2024  PCP:  Pcp, No   [facility] Providers Cardiologist:  None        Chief Complaint:  Chest pain  History of Present Illness:   Thomas [name] is a 43 year old male with CAD (h/o cardiac arrest and PCI to OM in 2017), h/o of neuropathy s/p right nephrectomy, CKD stage IIIa, HIV on HAART, h/o Hodgkin's lymphoma treated with chemotherapy in remission, admitted with chest pain.  Patient's chest pain started at 6 AM this morning, but has been having off-and-on chest pain with exertion for last few days. His mother, with whom he lives, called EMS. EKG showed lateral ST elevation and diffuse ST depression. On arrival, his chest pain had improved and ST changes were resolved. However, given concern for aborted STEMI, urgent cardiac catheterization was recommended.   Past Medical History:  Diagnosis Date   Acute respiratory failure with hypoxemia: post cardiac arrest 05/19/2014   Cancer (HCC)    hodgkins, no current treatment for   Cardiac arrest (HCC) 05/15/2014   Chronic renal insufficiency    CKD (chronic kidney disease) s/p Right nephrectomy (04/30/2014) 05/19/2014   CLABSI (central line-associated bloodstream infection) 02/27/2014   Enteritis 01/22/2014   History of blood transfusion july 2015 per pt   HIV disease (HCC) 06/25/2012   Hydronephrosis, right & hydroureter: secondary to obstructing ureteral calculi 03/01/2014   Leg pain 07/03/2012   Nephrolithiasis    NSTEMI (non-ST elevated myocardial infarction) (HCC) 05/19/2014   Obstructive uropathy 12/12/2014   Status post left renal stent   Penile cyst 08/06/2012   Septic shock (HCC) 01/18/2014   Shingles rash 02/25/2014   Sinus tachycardia 08/06/2012   Skin lesion 07/03/2012   Staphylococcus aureus bacteremia 02/25/2014   Transaminitis 05/19/2014    Past Surgical History:  Procedure Laterality  Date   ABCESS DRAINAGE  08/2008   drainage of perirectal 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  this after several previous I and Ds of rectal abcess.    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   CARDIAC CATHETERIZATION  05/19/2014   CARDIAC CATHETERIZATION N/A 10/28/2015   Procedure: Left Heart Cath and Coronary Angiography;  Surgeon: Gordy Bergamo, MD;  Location: [facility] INVASIVE CV LAB;  Service: Cardiovascular;  Laterality: N/A;   CORONARY STENT PLACEMENT  05/19/2014   OMI   & PROXIMAL CIRCUMFLEX   CYSTOSCOPY WITH RETROGRADE PYELOGRAM, URETEROSCOPY AND STENT PLACEMENT Left 12/11/2014   Procedure: CYSTOSCOPY WITH RETROGRADE PYELOGRAM, URETEROSCOPY, STONE REMOVAL, AND STENT PLACEMENT;  Surgeon: Mark Ottelin, MD;  Location: [facility] ORS;  Service: Urology;  Laterality: Left;   HOLMIUM LASER APPLICATION Left 12/11/2014   Procedure: HOLMIUM LASER APPLICATION;  Surgeon: Mark Ottelin, MD;  Location: [facility] ORS;  Service: Urology;  Laterality: Left;   LAPAROSCOPIC NEPHRECTOMY Right 04/30/2014   Procedure: LAPAROSCOPIC NEPHRECTOMY AND URETERECTOMY;  Surgeon: Gretel Ferrara, MD;  Location: [facility] ORS;  Service: Urology;  Laterality: Right;   LEFT HEART CATHETERIZATION WITH CORONARY ANGIOGRAM N/A 05/18/2014   Procedure: LEFT HEART CATHETERIZATION WITH CORONARY ANGIOGRAM;  Surgeon: Erick JONELLE Bergamo, MD;  Location[facility] CATH LAB;  Service: Cardiovascular;  Laterality: N/A;   pac placed and later removed     PERCUTANEOUS CORONARY STENT INTERVENTION (PCI-S) N/A 05/19/2014   Procedure: PERCUTANEOUS CORONARY STENT INTERVENTION (PCI-S);  Surgeon: Erick JONELLE Bergamo, MD;  Location: [facility] CATH LAB;  Service: Cardiovascular;  Laterality: N/A;   right nephrostomy tube  for last month and 1/2   SUPRACLAVICAL NODE BIOPSY Right 08/08/2013   Procedure: SUPRACLAVICULAR LYMPH NODE BIOPSY;  Surgeon: Dorise MARLA Fellers, MD;  Location: [facility] OR;  Service: Thoracic;  Laterality: Right;   TEE WITHOUT CARDIOVERSION N/A 02/27/2014   Procedure: TRANSESOPHAGEAL  ECHOCARDIOGRAM (TEE);  Surgeon: Vinie KYM Maxcy, MD;  Location: [facility] ENDOSCOPY;  Service: Cardiovascular;  Laterality: N/A;     Medications Prior to Admission: Prior to Admission medications   Medication Sig Start Date End Date Taking? Authorizing Provider  ASPIR-LOW 81 MG EC tablet TAKE ONE TABLET BY MOUTH DAILY 02/18/21   Ladona Heinz, MD  atorvastatin  (LIPITOR ) 40 MG tablet TAKE ONE TABLET BY MOUTH DAILY 03/24/20   Ladona Heinz, MD  nitroGLYCERIN  (NITROSTAT ) 0.4 MG SL tablet Place 1 tablet (0.4 mg total) under the tongue every 5 (five) minutes as needed for up to 25 days for chest pain. 05/19/19 04/30/24  Ladona Heinz, MD     Allergies:    Allergies  Allergen Reactions   Tylenol  [Acetaminophen ] Other (See Comments)    sweats    Social History:   Social History   Socioeconomic History   Marital status: Single    Spouse name: Not on file   Number of children: 0   Years of education: Not on file   Highest education level: Not on file  Occupational History   Not on file  Tobacco Use   Smoking status: Former    Types: Cigarettes   Smokeless tobacco: Never   Tobacco comments:    quitline info, counseling  Vaping Use   Vaping status: Never Used  Substance and Sexual Activity   Alcohol use: No   Drug use: No    Types: Marijuana    Comment: hasn't smoked marijuana in a while.    Sexual activity: Not Currently    Partners: Female    Birth control/protection: Condom    Comment: given condoms  Other Topics Concern   Not on file  Social History Narrative   Not on file   Social Drivers of Health   Financial Resource Strain: Not on file  Food Insecurity: Not on file  Transportation Needs: Not on file  Physical Activity: Not on file  Stress: Not on file  Social Connections: Unknown (12/06/2021)   Received from [facility]   Social Network    Social Network: Not on file  Intimate Partner Violence: Unknown (10/28/2021)   Received from [facility]   HITS    Physically Hurt:  Not on file    Insult or Talk Down To: Not on file    Threaten Physical Harm: Not on file    Scream or Curse: Not on file    Family History:   The patient's family history includes Diabetes in his mother.    ROS:  Please see the history of present illness.  All other ROS reviewed and negative.     Physical Exam/Data:   Vitals:   05/27/24 0841 05/27/24 0846 05/27/24 0851 05/27/24 0856  BP: 113/84 (!) 124/91 128/85 128/85  Pulse: 86 80 (!) 0 (!) 0  Resp: 15 11    SpO2: 91% 96% 95%   Weight:      Height:       No intake or output data in the 24 hours ending 05/27/24 0923    05/27/2024    7:28 AM 04/30/2024    8:59 AM 10/22/2023    9:29 AM  Last 3 Weights  Weight (lbs) 209 lb 209 lb  228 lb 6.4 oz  Weight (kg) 94.802 kg 94.802 kg 103.602 kg     Body mass index is 27.57 kg/m.   Physical Exam Vitals and nursing note reviewed.  Constitutional:      General: He is not in acute distress. Neck:     Vascular: No JVD.  Cardiovascular:     Rate and Rhythm: Normal rate and regular rhythm.     Heart sounds: Normal heart sounds. No murmur heard. Pulmonary:     Effort: Pulmonary effort is normal.     Breath sounds: Normal breath sounds. No wheezing or rales.  Musculoskeletal:     Right lower leg: No edema.     Left lower leg: No edema.      EKG:  The ECG that was done on 05/27/2024 was personally reviewed and demonstrates  Sinus rhythm Lateral ST elevation Diffuse ST depression  Relevant CV Studies: Coronary intervention 2017: 1. Severe diffuse coronary artery disease. Small vessels. 2. Previously placed stent in the high OM branch, that protrudes into the native ostial circumflex coronary artery is widely patent. The native circumflex mid to distal segment has diffuse disease. There is tandem 60 and 70% stenosis which in some views appears to be 80%. Small vessel and diffusely diseased. 3. Diffuse coronary artery disease involving the RCA specifically PL branch of the RCA  which is diffusely diseased and small. Has 70% diffuse disease. Proximal RCA has 40% stenosis. Catheter induced spasm was evident relieved with intracoronary nitroglycerin  at this site. 4. Mild disease in the LAD. 5. 20-25 mL of contrast utilized for diagnostic angiography.  Stress test 2021 (does not appear that your heart catheterization after the stress test): Lexiscan  (Walking with mod Bruce)Tetrofosmin  Stress Test  Nondiagnostic ECG stress. There is a moderate sized  reversible defect in the anterior, septal and apical regions extending from the mid ventricle to apex.  Overall function is abnormal with regional wall motion abnormalities, anterior wall hypokinesis. Stress LV EF: 51%.  No previous exam available for comparison. Intermediate to high risk study.     Laboratory Data:  High Sensitivity Troponin:   Recent Labs  Lab 05/27/24 0740  TROPONINIHS 2,686*      Chemistry Recent Labs  Lab 05/27/24 0740 05/27/24 0743 05/27/24 0748  NA 140 143 142  K 3.2* 3.4* 3.3*  CL 107 109  --   CO2 21*  --   --   GLUCOSE 104* 102*  --   BUN 18 19  --   CREATININE 1.80* 1.70*  --   CALCIUM  8.3*  --   --   GFRNONAA 48*  --   --   ANIONGAP 12  --   --     Recent Labs  Lab 05/27/24 0740  PROT 6.2*  ALBUMIN 2.7*  AST 28  ALT 22  ALKPHOS 43  BILITOT 0.6   Lipids  Recent Labs  Lab 05/27/24 0745  CHOL 198  TRIG 144  HDL 39*  LDLCALC 130*  CHOLHDL 5.1   Hematology Recent Labs  Lab 05/27/24 0740 05/27/24 0743 05/27/24 0748  WBC 5.2  --   --   RBC 4.13*  --   --   HGB 13.8 13.6 12.9*  HCT 40.7 40.0 38.0*  MCV 98.5  --   --   MCH 33.4  --   --   MCHC 33.9  --   --   RDW 12.9  --   --   PLT 187  --   --  Thyroid  No results for input(s): TSH, FREET4 in the last 168 hours. BNPNo results for input(s): BNP, PROBNP in the last 168 hours.  DDimer No results for input(s): DDIMER in the last 168 hours.   Radiology/Studies:  CARDIAC CATHETERIZATION Result  Date: 05/27/2024 Images from the original result were not included. Coronary angiography 05/27/2024: LM: Distal 20% stenosis LAD: Ostial 99% stenosis (Culprit lesion)          Occluded mid LAD (likely chronic) Lcx: Occluded OM1 stent (likely chronic)         Mid Lcx 30% disease         Left-to-left collaterals to OM1 RCA: Dominant vessel           Prox 30% disease          Left-to-tight collaterals to mid LAD LVEDP 14 mmHg Successful percutaneous coronary intervention ostial LAD        PTCA with 2.0 x 8, and 2.5 x 12 mm balloons with improvement in the LAD flow Intra-aortic balloon pump placement for stabilization pending CABG Conclusion: Severe two-vessel disease Recommendation: Admitted to 2H. Appreciate input from Dr. Shyrl, tentative plan for CABG tomorrow [name], MD     Assessment and Plan:   43 year old male with CAD (h/o cardiac arrest and PCI to OM in 2017), h/o of neuropathy s/p right nephrectomy, CKD stage IIIa, HIV on HAART, h/o Hodgkin's lymphoma treated with chemotherapy in remission, admitted with chest pain.  Chest pain: Acute ST elevation with diffuse ST depression concerning for left main equivalent disease. Resolution of chest pain and ST segment changes suggesting aborted STEMI. However, concern remains for impending recurrent STEMI, therefore urgent cardiac catheterization is recommended.  CKD: Baseline creatinine around 1.7-1.8, suggesting CKD stage IIIa. S/p right nephrectomy from obstructive uropathy in 2016. Monitor renal function closely.  HIV: Well-controlled, on [place]enu[place] , sees Dr. Overton from infectious disease.   Risk Assessment/Risk Scores:    TIMI Risk Score for ST  Elevation MI:   The patient's TIMI risk score is 3, which indicates a 4.4% risk of all cause mortality at 30 days.      Code Status: Full Code  Severity of Illness: The appropriate patient status for this patient is INPATIENT. Inpatient status is judged to be reasonable and  necessary in order to provide the required intensity of service to ensure the patient's safety. The patient's presenting symptoms, physical exam findings, and initial radiographic and laboratory data in the context of their chronic comorbidities is felt to place them at high risk for further clinical deterioration. Furthermore, it is not anticipated that the patient will be medically stable for discharge from the hospital within 2 midnights of admission.   * I certify that at the point of admission it is my clinical judgment that the patient will require inpatient hospital care spanning beyond 2 midnights from the point of admission due to high intensity of service, high risk for further deterioration and high frequency of surveillance required.*   For questions or updates, please contact [facility] Please consult [email or web address] for contact info under     Signed, [name], MD 05/27/2024 9:23 AM

## 2024-05-27 NOTE — TOC Initial Note (Addendum)
 Transition of Care [facility]) - Initial/Assessment Note    Patient Details  Name: Thomas [name] MRN: [id number] Date of Birth: Jan 02, 1981  Transition of Care [facility]) CM/SW Contact:    Justina Delcia Czar, RN Phone Number: [phone] 05/27/2024, 5:55 PM  Clinical Narrative:                 Spoke to pt, sister and states lives at home with Mom. States he was independent pta. Drives to appts.  States PCP, Lamar Bucks, Infectious Disease.    Pt will need PCP, Will arrange PCP follow up appt at dc.   Expected Discharge Plan: Home/Self Care Barriers to Discharge: Continued Medical Work up   Patient Goals and CMS Choice Patient states their goals for this hospitalization and ongoing recovery are:: wants to recover  Expected Discharge Plan and Services   Discharge Planning Services: CM Consult   Living arrangements for the past 2 months: Apartment                    Prior Living Arrangements/Services Living arrangements for the past 2 months: Apartment Lives with:: Parents Patient language and need for interpreter reviewed:: Yes Do you feel safe going back to the place where you live?: Yes      Need for Family Participation in Patient Care: No (Comment) Care giver support system in place?: No (comment)   Criminal Activity/Legal Involvement Pertinent to Current Situation/Hospitalization: No - Comment as needed  Activities of Daily Living   ADL Screening (condition at time of admission) Independently performs ADLs?: Yes (appropriate for developmental age) Is the patient deaf or have difficulty hearing?: No Does the patient have difficulty seeing, even when wearing glasses/contacts?: No Does the patient have difficulty concentrating, remembering, or making decisions?: No  Permission Sought/Granted Permission sought to share information with : Case Manager, Family Supports, PCP Permission granted to share information with : Yes, Verbal Permission Granted  Share Information  with NAME: Jermaine Donaldson  Permission granted to share info w AGENCY: PCP  Permission granted to share info w Relationship: mother  Permission granted to share info w Contact Information: [phone]  Emotional Assessment Appearance:: Appears stated age Attitude/Demeanor/Rapport: Engaged Affect (typically observed): Accepting Orientation: : Oriented to Self, Oriented to Place, Oriented to  Time, Oriented to Situation   Psych Involvement: No (comment)  Admission diagnosis:  ST elevation myocardial infarction (STEMI), unspecified artery (HCC) [I21.3] STEMI involving left anterior descending coronary artery (HCC) [I21.02] Patient Active Problem List   Diagnosis Date Noted   STEMI involving left anterior descending coronary artery (HCC) 05/27/2024   Need for prophylactic vaccination and inoculation against influenza 05/09/2021   Encounter for long-term (current) use of high-risk medication 08/16/2020   Medication monitoring encounter 03/06/2017   Screening examination for venereal disease 02/24/2016   Acute coronary syndrome (HCC) 10/27/2015   Unstable angina (HCC) 10/27/2015   Obstructive uropathy 12/12/2014   Nephrolithiasis 12/12/2014   Hyperlipidemia 12/10/2014   CKD (chronic kidney disease) s/p Right nephrectomy (04/30/2014) 05/19/2014   Renal neoplasm 04/30/2014   Hypokalemia 01/22/2014   Hodgkin's disease (HCC) 08/26/2013   Iron deficiency anemia, unspecified 08/04/2013   Tachycardia 08/06/2012   Penile cyst 08/06/2012   HIV disease (HCC) 06/25/2012   PCP:  Freddrick No Pharmacy:   [facility] - [place], [place] - [address] [facility] [address] [place] [address] Phone: [phone] Fax: [phone]  [id number] - [place], [place] - [address] [phone]  [place] [address] Phone: [phone] Fax: [phone]  [place] [facility] [address] [place] [place] [address] Phone: [phone]  Fax: [phone]  [id number] [place], [place] - [address] AT [facility] [address] [place] [address] Phone: [phone] Fax: [phone]     Social Drivers of Health (SDOH) Social History: SDOH Screenings   Food Insecurity: Patient Declined (05/27/2024)  Housing: Low Risk  (05/27/2024)  Transportation Needs: No Transportation Needs (05/27/2024)  Utilities: Not At Risk (05/27/2024)  Depression (PHQ2-9): Low Risk  (04/30/2024)  Social Connections: Unknown (12/06/2021)   Received from [facility]  Tobacco Use: Medium Risk (05/27/2024)   SDOH Interventions:     Readmission Risk Interventions     No data to display

## 2024-05-27 NOTE — Consult Note (Signed)
 [address]       [place] [address]             [phone]        Thomas [name] [facility] Medical Record #[id number] Date of Birth: 1980-10-18  Referring: No ref. provider found Primary Care: Pcp, No Primary Cardiologist:None  Chief Complaint:    Chief Complaint  Patient presents with   Code STEMI    History of Present Illness:     Thomas [name] is a 43 y.o. 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  Pt was admitted with chest pain, and ruled in for STEMI.  He has a history of HIV, CKI, and Hodgkin's lymphoma treated with chemotherapy.   Past Medical and Surgical History: Previous Chest Surgery: no Previous Chest Radiation: no Diabetes Mellitus: no.  HbA1C 5.2 Creatinine:  Lab Results  Component Value Date   CREATININE 1.70 (H) 05/27/2024   CREATININE 1.61 (H) 09/03/2023   CREATININE 1.78 (H) 09/21/2022     Past Medical History:  Diagnosis Date   Acute respiratory failure with hypoxemia: post cardiac arrest 05/19/2014   Cancer (HCC)    hodgkins, no current treatment for   Cardiac arrest (HCC) 05/15/2014   Chronic renal insufficiency    CKD (chronic kidney disease) s/p Right nephrectomy (04/30/2014) 05/19/2014   CLABSI (central line-associated bloodstream infection) 02/27/2014   Enteritis 01/22/2014   History of blood transfusion july 2015 per pt   HIV disease (HCC) 06/25/2012   Hydronephrosis, right & hydroureter: secondary to obstructing ureteral calculi 03/01/2014   Leg pain 07/03/2012   Nephrolithiasis    NSTEMI (non-ST elevated myocardial infarction) (HCC) 05/19/2014   Obstructive uropathy 12/12/2014   Status post left renal stent   Penile cyst 08/06/2012   Septic shock (HCC) 01/18/2014   Shingles rash 02/25/2014   Sinus tachycardia 08/06/2012   Skin lesion 07/03/2012   Staphylococcus aureus bacteremia 02/25/2014   Transaminitis 05/19/2014    Past Surgical History:  Procedure Laterality Date   ABCESS DRAINAGE  08/2008    drainage of perirectal 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  this after several previous I and Ds of rectal abcess.    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   CARDIAC CATHETERIZATION  05/19/2014   CARDIAC CATHETERIZATION N/A 10/28/2015   Procedure: Left Heart Cath and Coronary Angiography;  Surgeon: Gordy Bergamo, MD;  Location: [facility] INVASIVE CV LAB;  Service: Cardiovascular;  Laterality: N/A;   CORONARY STENT PLACEMENT  05/19/2014   OMI   & PROXIMAL CIRCUMFLEX   CYSTOSCOPY WITH RETROGRADE PYELOGRAM, URETEROSCOPY AND STENT PLACEMENT Left 12/11/2014   Procedure: CYSTOSCOPY WITH RETROGRADE PYELOGRAM, URETEROSCOPY, STONE REMOVAL, AND STENT PLACEMENT;  Surgeon: Mark Ottelin, MD;  Location: [facility] ORS;  Service: Urology;  Laterality: Left;   HOLMIUM LASER APPLICATION Left 12/11/2014   Procedure: HOLMIUM LASER APPLICATION;  Surgeon: Mark Ottelin, MD;  Location: [facility]S;  Service: Urology;  Laterality: Left;   LAPAROSCOPIC NEPHRECTOMY Right 04/30/2014   Procedure: LAPAROSCOPIC NEPHRECTOMY AND URETERECTOMY;  Surgeon: Gretel Ferrara, MD;  Location: [facility];  Service: Urology;  Laterality: Right;   LEFT HEART CATHETERIZATION WITH CORONARY ANGIOGRAM N/A 05/18/2014   Procedure: LEFT HEART CATHETERIZATION WITH CORONARY ANGIOGRAM;  Surgeon: Erick JONELLE Bergamo, MD;  Location: [facility] CATH LAB;  Service: Cardiovascular;  Laterality: N/A;   pac placed and later removed     PERCUTANEOUS CORONARY STENT INTERVENTION (PCI-S) N/A 05/19/2014   Procedure: PERCUTANEOUS CORONARY STENT INTERVENTION (PCI-S);  Surgeon:  Erick JONELLE Bergamo, MD;  Location: [facility] CATH LAB;  Service: Cardiovascular;  Laterality: N/A;   right nephrostomy tube  for last month and 1/2   SUPRACLAVICAL NODE BIOPSY Right 08/08/2013   Procedure: SUPRACLAVICULAR LYMPH NODE BIOPSY;  Surgeon: Dorise MARLA Fellers, MD;  Location: [facility] OR;  Service: Thoracic;  Laterality: Right;   TEE WITHOUT CARDIOVERSION N/A 02/27/2014   Procedure: TRANSESOPHAGEAL ECHOCARDIOGRAM (TEE);  Surgeon:  Vinie KYM Maxcy, MD;  Location: [facility] ENDOSCOPY;  Service: Cardiovascular;  Laterality: N/A;    Social History:  Social History   Tobacco Use  Smoking Status Former   Types: Cigarettes  Smokeless Tobacco Never  Tobacco Comments   quitline info, counseling    Social History   Substance and Sexual Activity  Alcohol Use No     Allergies  Allergen Reactions   Tylenol  [Acetaminophen ] Other (See Comments)    sweats    Medications:   Current Facility-Administered Medications  Medication Dose Route Frequency Provider Last Rate Last Admin   fentaNYL  (SUBLIMAZE ) injection    PRN Patwardhan, Manish J, MD   25 mcg at 05/27/24 0759   Heparin  (Porcine) in NaCl 1000-0.9 UT/500ML-% SOLN    PRN Patwardhan, Manish J, MD   500 mL at 05/27/24 0751   heparin  sodium (porcine) injection    PRN Elmira Newman PARAS, MD   10,000 Units at 05/27/24 0748   lidocaine  (PF) (XYLOCAINE ) 1 % injection    PRN Patwardhan, Newman PARAS, MD   2 mL at 05/27/24 0736   midazolam  PF (VERSED ) injection    PRN Patwardhan, Newman PARAS, MD   1 mg at 05/27/24 9261   nitroGLYCERIN  100 mcg/mL intra-arterial injection            norepinephrine  (LEVOPHED ) 4 mg in dextrose  5 % 250 mL (0.016 mg/mL) infusion    Continuous PRN Patwardhan, Manish J, MD 9.38 mL/hr at 05/27/24 0754 2.5 mcg/min at 05/27/24 0754   PHENYLephrine  80 mcg/ml in normal saline Adult IV Push Syringe (For Blood Pressure Support)    PRN Elmira Newman PARAS, MD   80 mcg at 05/27/24 9247   Radial Cocktail/Verapamil  only    PRN Patwardhan, Newman PARAS, MD   10 mL at 05/27/24 0738    Medications Prior to Admission  Medication Sig Dispense Refill Last Dose/Taking   ASPIR-LOW 81 MG EC tablet TAKE ONE TABLET BY MOUTH DAILY 90 tablet 0    atorvastatin  (LIPITOR ) 40 MG tablet TAKE ONE TABLET BY MOUTH DAILY 90 tablet 2    cabotegravir  & rilpivirine  ER (CABENUVA ) 600 & 900 MG/3ML injection Inject 1 kit into the muscle every 2 (two) months. 6 mL 5    cyclobenzaprine   (FLEXERIL ) 10 MG tablet Take 1 tablet (10 mg total) by mouth 2 (two) times daily as needed for muscle spasms. 20 tablet 0    metoprolol  succinate (TOPROL -XL) 25 MG 24 hr tablet TAKE ONE TABLET BY MOUTH DAILY WITH OR IMMEDIATELY FOLLOWING A MEAL 90 tablet 0    nitroGLYCERIN  (NITROSTAT ) 0.4 MG SL tablet Place 1 tablet (0.4 mg total) under the tongue every 5 (five) minutes as needed for up to 25 days for chest pain. 25 tablet 3     Family History  Problem Relation Age of Onset   Diabetes Mother      Review of Systems:   Review of Systems  Constitutional:  Positive for malaise/fatigue.  Respiratory:  Positive for shortness of breath.   Cardiovascular:  Positive for chest pain.  Physical Exam: Ht 6' 1 (1.854 m)   Wt 94.8 kg   SpO2 96%   BMI 27.57 kg/m  Physical Exam Constitutional:      General: He is not in acute distress.    Appearance: He is not ill-appearing.  HENT:     Head: Normocephalic and atraumatic.  Eyes:     Extraocular Movements: Extraocular movements intact.  Cardiovascular:     Rate and Rhythm: Normal rate.  Pulmonary:     Effort: Pulmonary effort is normal. No respiratory distress.  Abdominal:     General: Abdomen is flat. There is no distension.  Musculoskeletal:        General: Normal range of motion.     Cervical back: Normal range of motion.  Skin:    General: Skin is warm and dry.  Neurological:     General: No focal deficit present.     Mental Status: He is alert and oriented to person, place, and time.       Diagnostic Studies & Laboratory data: Cardiac Studies & Procedures   ______________________________________________________________________________________________ CARDIAC CATHETERIZATION  CARDIAC CATHETERIZATION 10/28/2015  Conclusion 1. Severe diffuse coronary artery disease. Small vessels. 2. Previously placed stent in the high OM branch, that protrudes into the native ostial circumflex coronary artery is widely patent. The  native circumflex mid to distal segment has diffuse disease. There is tandem 60 and 70% stenosis which in some views appears to be 80%. Small vessel and diffusely diseased. 3. Diffuse coronary artery disease involving the RCA specifically PL branch of the RCA which is diffusely diseased and small. Has 70% diffuse disease. Proximal RCA has 40% stenosis. Catheter induced spasm was evident relieved with intracoronary nitroglycerin  at this site. 4. Mild disease in the LAD. 5. 20-25 mL of contrast utilized for diagnostic angiography.  Rec: Medical therapy with aggressive risk factor reduction.  If he continues to have exertional chest discomfort, I will consider angioplasty to the AV groove circumflex coronary artery.  Findings Coronary Findings Diagnostic  Dominance: Right  Left Circumflex  Diffuse. Prior scoring balloon PTCA site widely patent  First Obtuse Marginal Branch The vessel is large in size. Previously placed Ost 1st Mrg to 1st Mrg drug eluting stent is patent. High OM1 stent protruded into the native Cx  Covering the AV grove circumflex.  Second Obtuse Marginal Branch The vessel is small in size.  Right Coronary Artery Diffuse.  Right Posterior Descending Artery The vessel is small in size.  Right Posterior Atrioventricular Artery The vessel is small in size. Diffuse.  Intervention  No interventions have been documented.   STRESS TESTS  PCV MYOCARDIAL PERFUSION WO LEXISCAN  08/06/2019  Narrative Lexiscan  (Walking with mod Bruce)Tetrofosmin  Stress Test  08/06/2019: Nondiagnostic ECG stress. There is a moderate sized  reversible defect in the anterior, septal and apical regions extending from the mid ventricle to apex. Overall function is abnormal with regional wall motion abnormalities, anterior wall hypokinesis. Stress LV EF: 51%. No previous exam available for comparison. Intermediate to high risk study.   ECHOCARDIOGRAM  PCV ECHOCARDIOGRAM COMPLETE  06/09/2019  Narrative Echocardiogram 06/09/2019: Left ventricle cavity is normal in size. Mild concentric hypertrophy of the left ventricle. Normal LV systolic function with visual EF 50-55%. Normal global wall motion. Normal diastolic filling pattern. Mild to moderate mitral regurgitation. Mild tricuspid regurgitation. No evidence of pulmonary hypertension.          ______________________________________________________________________________________________     EKG: sinus I have independently reviewed the above radiologic studies and discussed with  the patient   Recent Lab Findings: Lab Results  Component Value Date   WBC 5.2 05/27/2024   HGB 13.6 05/27/2024   HCT 40.0 05/27/2024   PLT 187 05/27/2024   GLUCOSE 102 (H) 05/27/2024   CHOL 234 (H) 09/03/2023   TRIG 218 (H) 09/03/2023   HDL 48 09/03/2023   LDLCALC 150 (H) 09/03/2023   ALT 18 09/03/2023   AST 19 09/03/2023   NA 143 05/27/2024   K 3.4 (L) 05/27/2024   CL 109 05/27/2024   CREATININE 1.70 (H) 05/27/2024   BUN 19 05/27/2024   CO2 21 09/03/2023   TSH 1.993 10/27/2015   INR 1.07 10/28/2015   HGBA1C 5.2 05/27/2024      Assessment / Plan:   43 y.o. male with 2V CAD, and STEMI.  Awaiting formal echo.  IABP being placed currently.  Will plan for surgery tomorrow     I  spent 40 minutes counseling the patient face to face.   Thomas [name] 05/27/2024 8:10 AM

## 2024-05-28 ENCOUNTER — Inpatient Hospital Stay (HOSPITAL_COMMUNITY)
Admission: EM | Disposition: A | Discharge status: Home Health | Payer: Self-pay | Source: Home / Self Care | Attending: Thoracic Surgery (Cardiothoracic Vascular Surgery)

## 2024-05-28 ENCOUNTER — Encounter (HOSPITAL_COMMUNITY): Payer: Self-pay | Admitting: Cardiology

## 2024-05-28 ENCOUNTER — Inpatient Hospital Stay (HOSPITAL_COMMUNITY): Admitting: Certified Registered"

## 2024-05-28 ENCOUNTER — Inpatient Hospital Stay (HOSPITAL_COMMUNITY)

## 2024-05-28 DIAGNOSIS — I5021 Acute systolic (congestive) heart failure: Secondary | ICD-10-CM | POA: Diagnosis not present

## 2024-05-28 DIAGNOSIS — D62 Acute posthemorrhagic anemia: Secondary | ICD-10-CM

## 2024-05-28 DIAGNOSIS — N179 Acute kidney failure, unspecified: Secondary | ICD-10-CM

## 2024-05-28 DIAGNOSIS — Z9281 Personal history of extracorporeal membrane oxygenation (ECMO): Secondary | ICD-10-CM

## 2024-05-28 DIAGNOSIS — D696 Thrombocytopenia, unspecified: Secondary | ICD-10-CM

## 2024-05-28 DIAGNOSIS — I2102 ST elevation (STEMI) myocardial infarction involving left anterior descending coronary artery: Secondary | ICD-10-CM | POA: Diagnosis not present

## 2024-05-28 DIAGNOSIS — I2510 Atherosclerotic heart disease of native coronary artery without angina pectoris: Secondary | ICD-10-CM | POA: Diagnosis not present

## 2024-05-28 DIAGNOSIS — R570 Cardiogenic shock: Secondary | ICD-10-CM | POA: Diagnosis not present

## 2024-05-28 DIAGNOSIS — Z951 Presence of aortocoronary bypass graft: Secondary | ICD-10-CM

## 2024-05-28 DIAGNOSIS — I509 Heart failure, unspecified: Secondary | ICD-10-CM | POA: Diagnosis not present

## 2024-05-28 DIAGNOSIS — Z9911 Dependence on respirator [ventilator] status: Secondary | ICD-10-CM

## 2024-05-28 DIAGNOSIS — Z87891 Personal history of nicotine dependence: Secondary | ICD-10-CM

## 2024-05-28 DIAGNOSIS — I25119 Atherosclerotic heart disease of native coronary artery with unspecified angina pectoris: Secondary | ICD-10-CM | POA: Diagnosis not present

## 2024-05-28 DIAGNOSIS — N1831 Chronic kidney disease, stage 3a: Secondary | ICD-10-CM | POA: Diagnosis not present

## 2024-05-28 DIAGNOSIS — N189 Chronic kidney disease, unspecified: Secondary | ICD-10-CM

## 2024-05-28 DIAGNOSIS — J189 Pneumonia, unspecified organism: Secondary | ICD-10-CM

## 2024-05-28 HISTORY — PX: CANNULATION FOR ECMO (EXTRACORPOREAL MEMBRANE OXYGENATION): SHX6796

## 2024-05-28 HISTORY — PX: CORONARY ARTERY BYPASS GRAFT: SHX141

## 2024-05-28 HISTORY — PX: TEE WITHOUT CARDIOVERSION: SHX5443

## 2024-05-28 LAB — BASIC METABOLIC PANEL WITH GFR
Anion gap: 11 (ref 5–15)
Anion gap: 12 (ref 5–15)
BUN: 13 mg/dL (ref 6–20)
BUN: 12 mg/dL (ref 6–20)
CO2: 21 mmol/L — ABNORMAL LOW (ref 22–32)
CO2: 19 mmol/L — ABNORMAL LOW (ref 22–32)
Calcium: 8.5 mg/dL — ABNORMAL LOW (ref 8.9–10.3)
Calcium: 7.5 mg/dL — ABNORMAL LOW (ref 8.9–10.3)
Chloride: 107 mmol/L (ref 98–111)
Chloride: 111 mmol/L (ref 98–111)
Creatinine, Ser: 1.53 mg/dL — ABNORMAL HIGH (ref 0.61–1.24)
Creatinine, Ser: 1.85 mg/dL — ABNORMAL HIGH (ref 0.61–1.24)
GFR, Estimated: 58 mL/min — ABNORMAL LOW (ref >60)
GFR, Estimated: 46 mL/min — ABNORMAL LOW (ref >60)
Glucose, Bld: 95 mg/dL (ref 70–99)
Glucose, Bld: 169 mg/dL — ABNORMAL HIGH (ref 70–99)
Potassium: 3.6 mmol/L (ref 3.5–5.1)
Potassium: 4.2 mmol/L (ref 3.5–5.1)
Sodium: 139 mmol/L (ref 135–145)
Sodium: 142 mmol/L (ref 135–145)

## 2024-05-28 LAB — POCT I-STAT 7, (LYTES, BLD GAS, ICA,H+H)
Acid-base deficit: 5 mmol/L — ABNORMAL HIGH (ref 0.0–2.0)
Acid-base deficit: 5 mmol/L — ABNORMAL HIGH (ref 0.0–2.0)
Acid-base deficit: 4 mmol/L — ABNORMAL HIGH (ref 0.0–2.0)
Acid-base deficit: 5 mmol/L — ABNORMAL HIGH (ref 0.0–2.0)
Acid-base deficit: 6 mmol/L — ABNORMAL HIGH (ref 0.0–2.0)
Acid-base deficit: 7 mmol/L — ABNORMAL HIGH (ref 0.0–2.0)
Acid-base deficit: 8 mmol/L — ABNORMAL HIGH (ref 0.0–2.0)
Bicarbonate: 20.3 mmol/L (ref 20.0–28.0)
Bicarbonate: 20.1 mmol/L (ref 20.0–28.0)
Bicarbonate: 22.1 mmol/L (ref 20.0–28.0)
Bicarbonate: 20.8 mmol/L (ref 20.0–28.0)
Bicarbonate: 21.2 mmol/L (ref 20.0–28.0)
Bicarbonate: 17.4 mmol/L — ABNORMAL LOW (ref 20.0–28.0)
Bicarbonate: 16.6 mmol/L — ABNORMAL LOW (ref 20.0–28.0)
Calcium, Ion: 1.19 mmol/L (ref 1.15–1.40)
Calcium, Ion: 1.04 mmol/L — ABNORMAL LOW (ref 1.15–1.40)
Calcium, Ion: 1.17 mmol/L (ref 1.15–1.40)
Calcium, Ion: 1.09 mmol/L — ABNORMAL LOW (ref 1.15–1.40)
Calcium, Ion: 1.15 mmol/L (ref 1.15–1.40)
Calcium, Ion: 1.06 mmol/L — ABNORMAL LOW (ref 1.15–1.40)
Calcium, Ion: 1.03 mmol/L — ABNORMAL LOW (ref 1.15–1.40)
HCT: 32 % — ABNORMAL LOW (ref 39.0–52.0)
HCT: 26 % — ABNORMAL LOW (ref 39.0–52.0)
HCT: 20 % — ABNORMAL LOW (ref 39.0–52.0)
HCT: 19 % — ABNORMAL LOW (ref 39.0–52.0)
HCT: 31 % — ABNORMAL LOW (ref 39.0–52.0)
HCT: 25 % — ABNORMAL LOW (ref 39.0–52.0)
HCT: 24 % — ABNORMAL LOW (ref 39.0–52.0)
Hemoglobin: 10.9 g/dL — ABNORMAL LOW (ref 13.0–17.0)
Hemoglobin: 8.8 g/dL — ABNORMAL LOW (ref 13.0–17.0)
Hemoglobin: 6.8 g/dL — CL (ref 13.0–17.0)
Hemoglobin: 6.5 g/dL — CL (ref 13.0–17.0)
Hemoglobin: 10.5 g/dL — ABNORMAL LOW (ref 13.0–17.0)
Hemoglobin: 8.5 g/dL — ABNORMAL LOW (ref 13.0–17.0)
Hemoglobin: 8.2 g/dL — ABNORMAL LOW (ref 13.0–17.0)
O2 Saturation: 98 %
O2 Saturation: 100 %
O2 Saturation: 100 %
O2 Saturation: 100 %
O2 Saturation: 100 %
O2 Saturation: 95 %
O2 Saturation: 99 %
Patient temperature: 36
Patient temperature: 36.2
Patient temperature: 36
Patient temperature: 36.2
Potassium: 3.6 mmol/L (ref 3.5–5.1)
Potassium: 3.7 mmol/L (ref 3.5–5.1)
Potassium: 4.7 mmol/L (ref 3.5–5.1)
Potassium: 4 mmol/L (ref 3.5–5.1)
Potassium: 4.2 mmol/L (ref 3.5–5.1)
Potassium: 3.9 mmol/L (ref 3.5–5.1)
Potassium: 3.8 mmol/L (ref 3.5–5.1)
Sodium: 141 mmol/L (ref 135–145)
Sodium: 140 mmol/L (ref 135–145)
Sodium: 141 mmol/L (ref 135–145)
Sodium: 141 mmol/L (ref 135–145)
Sodium: 142 mmol/L (ref 135–145)
Sodium: 145 mmol/L (ref 135–145)
Sodium: 145 mmol/L (ref 135–145)
TCO2: 21 mmol/L — ABNORMAL LOW (ref 22–32)
TCO2: 21 mmol/L — ABNORMAL LOW (ref 22–32)
TCO2: 24 mmol/L (ref 22–32)
TCO2: 22 mmol/L (ref 22–32)
TCO2: 23 mmol/L (ref 22–32)
TCO2: 18 mmol/L — ABNORMAL LOW (ref 22–32)
TCO2: 17 mmol/L — ABNORMAL LOW (ref 22–32)
pCO2 arterial: 37.2 mmHg (ref 32–48)
pCO2 arterial: 37.1 mmHg (ref 32–48)
pCO2 arterial: 49.2 mmHg — ABNORMAL HIGH (ref 32–48)
pCO2 arterial: 38.9 mmHg (ref 32–48)
pCO2 arterial: 44.1 mmHg (ref 32–48)
pCO2 arterial: 29.9 mmHg — ABNORMAL LOW (ref 32–48)
pCO2 arterial: 28.6 mmHg — ABNORMAL LOW (ref 32–48)
pH, Arterial: 7.345 — ABNORMAL LOW (ref 7.35–7.45)
pH, Arterial: 7.341 — ABNORMAL LOW (ref 7.35–7.45)
pH, Arterial: 7.261 — ABNORMAL LOW (ref 7.35–7.45)
pH, Arterial: 7.331 — ABNORMAL LOW (ref 7.35–7.45)
pH, Arterial: 7.285 — ABNORMAL LOW (ref 7.35–7.45)
pH, Arterial: 7.367 (ref 7.35–7.45)
pH, Arterial: 7.368 (ref 7.35–7.45)
pO2, Arterial: 105 mmHg (ref 83–108)
pO2, Arterial: 333 mmHg — ABNORMAL HIGH (ref 83–108)
pO2, Arterial: 250 mmHg — ABNORMAL HIGH (ref 83–108)
pO2, Arterial: 440 mmHg — ABNORMAL HIGH (ref 83–108)
pO2, Arterial: 465 mmHg — ABNORMAL HIGH (ref 83–108)
pO2, Arterial: 74 mmHg — ABNORMAL LOW (ref 83–108)
pO2, Arterial: 128 mmHg — ABNORMAL HIGH (ref 83–108)

## 2024-05-28 LAB — CBC
HCT: 40.2 % (ref 39.0–52.0)
HCT: 33.6 % — ABNORMAL LOW (ref 39.0–52.0)
HCT: 31 % — ABNORMAL LOW (ref 39.0–52.0)
HCT: 29.9 % — ABNORMAL LOW (ref 39.0–52.0)
Hemoglobin: 13.4 g/dL (ref 13.0–17.0)
Hemoglobin: 11.2 g/dL — ABNORMAL LOW (ref 13.0–17.0)
Hemoglobin: 10.6 g/dL — ABNORMAL LOW (ref 13.0–17.0)
Hemoglobin: 10 g/dL — ABNORMAL LOW (ref 13.0–17.0)
MCH: 33.3 pg (ref 26.0–34.0)
MCH: 32.7 pg (ref 26.0–34.0)
MCH: 33 pg (ref 26.0–34.0)
MCH: 31.8 pg (ref 26.0–34.0)
MCHC: 33.3 g/dL (ref 30.0–36.0)
MCHC: 33.3 g/dL (ref 30.0–36.0)
MCHC: 34.2 g/dL (ref 30.0–36.0)
MCHC: 33.4 g/dL (ref 30.0–36.0)
MCV: 100 fL (ref 80.0–100.0)
MCV: 98.2 fL (ref 80.0–100.0)
MCV: 96.6 fL (ref 80.0–100.0)
MCV: 95.2 fL (ref 80.0–100.0)
Platelets: 176 K/uL (ref 150–400)
Platelets: 90 K/uL — ABNORMAL LOW (ref 150–400)
Platelets: 80 K/uL — ABNORMAL LOW (ref 150–400)
Platelets: 94 K/uL — ABNORMAL LOW (ref 150–400)
RBC: 4.02 MIL/uL — ABNORMAL LOW (ref 4.22–5.81)
RBC: 3.42 MIL/uL — ABNORMAL LOW (ref 4.22–5.81)
RBC: 3.21 MIL/uL — ABNORMAL LOW (ref 4.22–5.81)
RBC: 3.14 MIL/uL — ABNORMAL LOW (ref 4.22–5.81)
RDW: 13 % (ref 11.5–15.5)
RDW: 15 % (ref 11.5–15.5)
RDW: 15.4 % (ref 11.5–15.5)
RDW: 15.5 % (ref 11.5–15.5)
WBC: 6.2 K/uL (ref 4.0–10.5)
WBC: 9.6 K/uL (ref 4.0–10.5)
WBC: 7.2 K/uL (ref 4.0–10.5)
WBC: 9.2 K/uL (ref 4.0–10.5)
nRBC: 0 % (ref 0.0–0.2)
nRBC: 0 % (ref 0.0–0.2)
nRBC: 0 % (ref 0.0–0.2)
nRBC: 0 % (ref 0.0–0.2)

## 2024-05-28 LAB — POCT I-STAT, CHEM 8
BUN: 12 mg/dL (ref 6–20)
BUN: 13 mg/dL (ref 6–20)
BUN: 13 mg/dL (ref 6–20)
BUN: 13 mg/dL (ref 6–20)
BUN: 12 mg/dL (ref 6–20)
Calcium, Ion: 1.16 mmol/L (ref 1.15–1.40)
Calcium, Ion: 1.21 mmol/L (ref 1.15–1.40)
Calcium, Ion: 1.03 mmol/L — ABNORMAL LOW (ref 1.15–1.40)
Calcium, Ion: 1.18 mmol/L (ref 1.15–1.40)
Calcium, Ion: 1.1 mmol/L — ABNORMAL LOW (ref 1.15–1.40)
Chloride: 108 mmol/L (ref 98–111)
Chloride: 107 mmol/L (ref 98–111)
Chloride: 107 mmol/L (ref 98–111)
Chloride: 105 mmol/L (ref 98–111)
Chloride: 105 mmol/L (ref 98–111)
Creatinine, Ser: 1.6 mg/dL — ABNORMAL HIGH (ref 0.61–1.24)
Creatinine, Ser: 1.8 mg/dL — ABNORMAL HIGH (ref 0.61–1.24)
Creatinine, Ser: 1.5 mg/dL — ABNORMAL HIGH (ref 0.61–1.24)
Creatinine, Ser: 1.6 mg/dL — ABNORMAL HIGH (ref 0.61–1.24)
Creatinine, Ser: 1.7 mg/dL — ABNORMAL HIGH (ref 0.61–1.24)
Glucose, Bld: 96 mg/dL (ref 70–99)
Glucose, Bld: 105 mg/dL — ABNORMAL HIGH (ref 70–99)
Glucose, Bld: 109 mg/dL — ABNORMAL HIGH (ref 70–99)
Glucose, Bld: 258 mg/dL — ABNORMAL HIGH (ref 70–99)
Glucose, Bld: 209 mg/dL — ABNORMAL HIGH (ref 70–99)
HCT: 32 % — ABNORMAL LOW (ref 39.0–52.0)
HCT: 32 % — ABNORMAL LOW (ref 39.0–52.0)
HCT: 26 % — ABNORMAL LOW (ref 39.0–52.0)
HCT: 20 % — ABNORMAL LOW (ref 39.0–52.0)
HCT: 20 % — ABNORMAL LOW (ref 39.0–52.0)
Hemoglobin: 10.9 g/dL — ABNORMAL LOW (ref 13.0–17.0)
Hemoglobin: 10.9 g/dL — ABNORMAL LOW (ref 13.0–17.0)
Hemoglobin: 8.8 g/dL — ABNORMAL LOW (ref 13.0–17.0)
Hemoglobin: 6.8 g/dL — CL (ref 13.0–17.0)
Hemoglobin: 6.8 g/dL — CL (ref 13.0–17.0)
Potassium: 3.6 mmol/L (ref 3.5–5.1)
Potassium: 3.9 mmol/L (ref 3.5–5.1)
Potassium: 4.9 mmol/L (ref 3.5–5.1)
Potassium: 4 mmol/L (ref 3.5–5.1)
Potassium: 4.1 mmol/L (ref 3.5–5.1)
Sodium: 140 mmol/L (ref 135–145)
Sodium: 140 mmol/L (ref 135–145)
Sodium: 139 mmol/L (ref 135–145)
Sodium: 141 mmol/L (ref 135–145)
Sodium: 140 mmol/L (ref 135–145)
TCO2: 20 mmol/L — ABNORMAL LOW (ref 22–32)
TCO2: 20 mmol/L — ABNORMAL LOW (ref 22–32)
TCO2: 24 mmol/L (ref 22–32)
TCO2: 22 mmol/L (ref 22–32)
TCO2: 25 mmol/L (ref 22–32)

## 2024-05-28 LAB — POCT I-STAT EG7
Acid-base deficit: 5 mmol/L — ABNORMAL HIGH (ref 0.0–2.0)
Bicarbonate: 20.6 mmol/L (ref 20.0–28.0)
Calcium, Ion: 1.05 mmol/L — ABNORMAL LOW (ref 1.15–1.40)
HCT: 26 % — ABNORMAL LOW (ref 39.0–52.0)
Hemoglobin: 8.8 g/dL — ABNORMAL LOW (ref 13.0–17.0)
O2 Saturation: 79 %
Potassium: 4.7 mmol/L (ref 3.5–5.1)
Sodium: 140 mmol/L (ref 135–145)
TCO2: 22 mmol/L (ref 22–32)
pCO2, Ven: 40.8 mmHg — ABNORMAL LOW (ref 44–60)
pH, Ven: 7.31 (ref 7.25–7.43)
pO2, Ven: 48 mmHg — ABNORMAL HIGH (ref 32–45)

## 2024-05-28 LAB — HEPATIC FUNCTION PANEL
ALT: 10 U/L (ref 0–44)
AST: 29 U/L (ref 15–41)
Albumin: 2.2 g/dL — ABNORMAL LOW (ref 3.5–5.0)
Alkaline Phosphatase: 21 U/L — ABNORMAL LOW (ref 38–126)
Bilirubin, Direct: 0.5 mg/dL — ABNORMAL HIGH (ref 0.0–0.2)
Indirect Bilirubin: 0.5 mg/dL (ref 0.3–0.9)
Total Bilirubin: 1 mg/dL (ref 0.0–1.2)
Total Protein: 3.9 g/dL — ABNORMAL LOW (ref 6.5–8.1)

## 2024-05-28 LAB — GLUCOSE, CAPILLARY
Glucose-Capillary: 132 mg/dL — ABNORMAL HIGH (ref 70–99)
Glucose-Capillary: 105 mg/dL — ABNORMAL HIGH (ref 70–99)
Glucose-Capillary: 135 mg/dL — ABNORMAL HIGH (ref 70–99)
Glucose-Capillary: 109 mg/dL — ABNORMAL HIGH (ref 70–99)
Glucose-Capillary: 145 mg/dL — ABNORMAL HIGH (ref 70–99)
Glucose-Capillary: 174 mg/dL — ABNORMAL HIGH (ref 70–99)
Glucose-Capillary: 164 mg/dL — ABNORMAL HIGH (ref 70–99)
Glucose-Capillary: 161 mg/dL — ABNORMAL HIGH (ref 70–99)

## 2024-05-28 LAB — GLOBAL TEG PANEL
CFF Max Amplitude: 18.5 mm (ref 15–32)
CK with Heparinase (R): 5.7 min (ref 4.3–8.3)
Citrated Functional Fibrinogen: 337.6 mg/dL (ref 278–581)
Citrated Kaolin (K): 2.6 min — ABNORMAL HIGH (ref 0.8–2.1)
Citrated Kaolin (MA): 47.4 mm — ABNORMAL LOW (ref 52–69)
Citrated Kaolin (R): 8.3 min (ref 4.6–9.1)
Citrated Kaolin Angle: 63.4 deg (ref 63–78)
Citrated Rapid TEG (MA): 52.1 mm (ref 52–70)

## 2024-05-28 LAB — PROTIME-INR
INR: 1.4 — ABNORMAL HIGH (ref 0.8–1.2)
Prothrombin Time: 18.3 s — ABNORMAL HIGH (ref 11.4–15.2)

## 2024-05-28 LAB — VAS US DOPPLER PRE CABG

## 2024-05-28 LAB — MAGNESIUM: Magnesium: 3.1 mg/dL — ABNORMAL HIGH (ref 1.7–2.4)

## 2024-05-28 LAB — POCT ACTIVATED CLOTTING TIME: Activated Clotting Time: 124 s

## 2024-05-28 LAB — HEPARIN LEVEL (UNFRACTIONATED): Heparin Unfractionated: 0.15 [IU]/mL — ABNORMAL LOW (ref 0.30–0.70)

## 2024-05-28 LAB — COOXEMETRY PANEL
Carboxyhemoglobin: 2.2 % — ABNORMAL HIGH (ref 0.5–1.5)
Methemoglobin: 1.6 % — ABNORMAL HIGH (ref 0.0–1.5)
O2 Saturation: 88.9 %
Total hemoglobin: 11.6 g/dL — ABNORMAL LOW (ref 12.0–16.0)

## 2024-05-28 LAB — LACTATE DEHYDROGENASE: LDH: 251 U/L — ABNORMAL HIGH (ref 98–192)

## 2024-05-28 LAB — APTT: aPTT: 32 s (ref 24–36)

## 2024-05-28 LAB — PLATELET COUNT: Platelets: 109 K/uL — ABNORMAL LOW (ref 150–400)

## 2024-05-28 LAB — FIBRINOGEN: Fibrinogen: 342 mg/dL (ref 210–475)

## 2024-05-28 LAB — HEMOGLOBIN AND HEMATOCRIT, BLOOD
HCT: 27.1 % — ABNORMAL LOW (ref 39.0–52.0)
Hemoglobin: 8.9 g/dL — ABNORMAL LOW (ref 13.0–17.0)

## 2024-05-28 LAB — PREPARE RBC (CROSSMATCH)

## 2024-05-28 LAB — CG4 I-STAT (LACTIC ACID): Lactic Acid, Venous: 1 mmol/L (ref 0.5–1.9)

## 2024-05-28 SURGERY — CORONARY ARTERY BYPASS GRAFTING (CABG)
Anesthesia: General | Site: Chest

## 2024-05-28 MED ORDER — CLEVIDIPINE BUTYRATE 0.5 MG/ML IV EMUL
0.0000 mg/h | INTRAVENOUS | Status: DC
  Administered 2024-05-28 (×2): 2 mg/h via INTRAVENOUS

## 2024-05-28 MED ORDER — INSULIN REGULAR(HUMAN) IN NACL 100-0.9 UT/100ML-% IV SOLN
INTRAVENOUS | Status: DC
  Administered 2024-05-31: 0.6 [IU]/h via INTRAVENOUS
  Filled 2024-05-28: qty 100

## 2024-05-28 MED ORDER — ASPIRIN 81 MG PO CHEW
324.0000 mg | CHEWABLE_TABLET | Freq: Every day | ORAL | Status: DC
  Administered 2024-06-01 – 2024-06-02 (×2): 324 mg
  Filled 2024-05-28 (×3): qty 4

## 2024-05-28 MED ORDER — LACTATED RINGERS IV SOLN: INTRAVENOUS | Status: DC | PRN

## 2024-05-28 MED ORDER — SODIUM CHLORIDE 0.9 % IV SOLN: INTRAVENOUS | Status: AC

## 2024-05-28 MED ORDER — ROCURONIUM BROMIDE 10 MG/ML (PF) SYRINGE
PREFILLED_SYRINGE | INTRAVENOUS | Status: DC | PRN
  Administered 2024-05-28 (×2): 50 mg via INTRAVENOUS
  Administered 2024-05-28: 100 mg via INTRAVENOUS
  Administered 2024-05-28: 50 mg via INTRAVENOUS

## 2024-05-28 MED ORDER — ALBUMIN HUMAN 5 % IV SOLN
250.0000 mL | INTRAVENOUS | Status: AC | PRN
  Administered 2024-05-28 (×5): 12.5 g via INTRAVENOUS

## 2024-05-28 MED ORDER — PROPOFOL 10 MG/ML IV BOLUS
INTRAVENOUS | Status: AC
  Filled 2024-05-28: qty 20

## 2024-05-28 MED ORDER — BISACODYL 10 MG RE SUPP
10.0000 mg | Freq: Every day | RECTAL | Status: DC
  Administered 2024-05-31 – 2024-06-01 (×2): 10 mg via RECTAL
  Filled 2024-05-28 (×2): qty 1

## 2024-05-28 MED ORDER — PHENYLEPHRINE 80 MCG/ML (10ML) SYRINGE FOR IV PUSH (FOR BLOOD PRESSURE SUPPORT)
PREFILLED_SYRINGE | INTRAVENOUS | Status: DC | PRN
  Administered 2024-05-28 (×2): 160 ug via INTRAVENOUS
  Administered 2024-05-28: 50 ug via INTRAVENOUS

## 2024-05-28 MED ORDER — HEPARIN SODIUM (PORCINE) 1000 UNIT/ML IJ SOLN
INTRAMUSCULAR | Status: AC
  Filled 2024-05-28: qty 10

## 2024-05-28 MED ORDER — METOCLOPRAMIDE HCL 5 MG/ML IJ SOLN
10.0000 mg | Freq: Four times a day (QID) | INTRAMUSCULAR | Status: AC
  Administered 2024-05-28 – 2024-05-29 (×5): 10 mg via INTRAVENOUS
  Filled 2024-05-28 (×6): qty 2

## 2024-05-28 MED ORDER — ASPIRIN 81 MG PO CHEW
324.0000 mg | CHEWABLE_TABLET | Freq: Once | ORAL | Status: AC
  Administered 2024-05-28: 324 mg
  Filled 2024-05-28: qty 4

## 2024-05-28 MED ORDER — FENTANYL CITRATE (PF) 50 MCG/ML IJ SOSY: 25.0000 ug | PREFILLED_SYRINGE | Freq: Once | INTRAMUSCULAR | Status: DC

## 2024-05-28 MED ORDER — ALBUMIN HUMAN 5 % IV SOLN: INTRAVENOUS | Status: DC | PRN

## 2024-05-28 MED ORDER — FENTANYL CITRATE (PF) 250 MCG/5ML IJ SOLN
INTRAMUSCULAR | Status: DC | PRN
  Administered 2024-05-28 (×3): 150 ug via INTRAVENOUS
  Administered 2024-05-28: 250 ug via INTRAVENOUS
  Administered 2024-05-28 (×2): 150 ug via INTRAVENOUS

## 2024-05-28 MED ORDER — OXYCODONE HCL 5 MG PO TABS
5.0000 mg | ORAL_TABLET | ORAL | Status: DC | PRN
  Filled 2024-05-28: qty 1

## 2024-05-28 MED ORDER — LACTATED RINGERS IV SOLN: INTRAVENOUS | Status: AC

## 2024-05-28 MED ORDER — METOPROLOL TARTRATE 25 MG/10 ML ORAL SUSPENSION: 12.5000 mg | Freq: Two times a day (BID) | ORAL | Status: DC

## 2024-05-28 MED ORDER — PROPOFOL 1000 MG/100ML IV EMUL
INTRAVENOUS | Status: AC
  Filled 2024-05-28: qty 100

## 2024-05-28 MED ORDER — CALCIUM CHLORIDE 10 % IV SOLN
INTRAVENOUS | Status: AC
  Filled 2024-05-28: qty 10

## 2024-05-28 MED ORDER — MIDAZOLAM HCL (PF) 10 MG/2ML IJ SOLN
INTRAMUSCULAR | Status: AC
  Filled 2024-05-28: qty 2

## 2024-05-28 MED ORDER — SODIUM CHLORIDE 0.9% IV SOLUTION: Freq: Once | INTRAVENOUS | Status: AC

## 2024-05-28 MED ORDER — VASOPRESSIN 20 UNIT/ML IV SOLN
INTRAVENOUS | Status: AC
  Filled 2024-05-28: qty 1

## 2024-05-28 MED ORDER — ACETAMINOPHEN 160 MG/5ML PO SOLN
1000.0000 mg | Freq: Four times a day (QID) | ORAL | Status: AC
  Administered 2024-05-29 – 2024-06-02 (×15): 1000 mg
  Filled 2024-05-28 (×15): qty 40.6

## 2024-05-28 MED ORDER — MIDAZOLAM HCL (PF) 5 MG/ML IJ SOLN
INTRAMUSCULAR | Status: DC | PRN
  Administered 2024-05-28: 3 mg via INTRAVENOUS
  Administered 2024-05-28 (×3): 2 mg via INTRAVENOUS
  Administered 2024-05-28: 1 mg via INTRAVENOUS

## 2024-05-28 MED ORDER — ONDANSETRON HCL 4 MG/2ML IJ SOLN
4.0000 mg | Freq: Four times a day (QID) | INTRAMUSCULAR | Status: DC | PRN
  Administered 2024-05-31 – 2024-06-01 (×2): 4 mg via INTRAVENOUS
  Filled 2024-05-28 (×2): qty 2

## 2024-05-28 MED ORDER — CEFAZOLIN SODIUM-DEXTROSE 2-4 GM/100ML-% IV SOLN
2.0000 g | Freq: Three times a day (TID) | INTRAVENOUS | Status: DC
  Administered 2024-05-28 – 2024-05-29 (×4): 2 g via INTRAVENOUS
  Filled 2024-05-28 (×4): qty 100

## 2024-05-28 MED ORDER — EPHEDRINE 5 MG/ML INJ
INTRAVENOUS | Status: AC
  Filled 2024-05-28: qty 5

## 2024-05-28 MED ORDER — MILRINONE LACTATE IN DEXTROSE 20-5 MG/100ML-% IV SOLN
0.1250 ug/kg/min | INTRAVENOUS | Status: DC
  Administered 2024-05-29 – 2024-06-01 (×7): 0.375 ug/kg/min via INTRAVENOUS
  Administered 2024-06-01 – 2024-06-03 (×2): 0.125 ug/kg/min via INTRAVENOUS
  Filled 2024-05-28 (×11): qty 100

## 2024-05-28 MED ORDER — PROPOFOL 10 MG/ML IV BOLUS
INTRAVENOUS | Status: DC | PRN
  Administered 2024-05-28: 40 mg via INTRAVENOUS
  Administered 2024-05-28: 50 mg via INTRAVENOUS

## 2024-05-28 MED ORDER — PANTOPRAZOLE SODIUM 40 MG IV SOLR: 40.0000 mg | Freq: Every day | INTRAVENOUS | Status: DC

## 2024-05-28 MED ORDER — PANTOPRAZOLE SODIUM 40 MG IV SOLR
40.0000 mg | Freq: Every day | INTRAVENOUS | Status: DC
  Administered 2024-05-28 – 2024-06-04 (×8): 40 mg via INTRAVENOUS
  Filled 2024-05-28 (×8): qty 10

## 2024-05-28 MED ORDER — VANCOMYCIN HCL IN DEXTROSE 1-5 GM/200ML-% IV SOLN
1000.0000 mg | Freq: Once | INTRAVENOUS | Status: AC
  Administered 2024-05-28: 1000 mg via INTRAVENOUS
  Filled 2024-05-28: qty 200

## 2024-05-28 MED ORDER — PHENYLEPHRINE 80 MCG/ML (10ML) SYRINGE FOR IV PUSH (FOR BLOOD PRESSURE SUPPORT)
PREFILLED_SYRINGE | INTRAVENOUS | Status: AC
  Filled 2024-05-28: qty 10

## 2024-05-28 MED ORDER — POLYETHYLENE GLYCOL 3350 17 G PO PACK
17.0000 g | PACK | Freq: Every day | ORAL | Status: DC
  Administered 2024-06-01: 17 g
  Filled 2024-05-28: qty 1

## 2024-05-28 MED ORDER — FENTANYL CITRATE (PF) 250 MCG/5ML IJ SOLN
INTRAMUSCULAR | Status: AC
  Filled 2024-05-28: qty 5

## 2024-05-28 MED ORDER — PROTAMINE SULFATE 10 MG/ML IV SOLN
INTRAVENOUS | Status: AC
  Filled 2024-05-28: qty 25

## 2024-05-28 MED ORDER — PROTAMINE SULFATE 10 MG/ML IV SOLN
INTRAVENOUS | Status: AC
  Filled 2024-05-28: qty 5

## 2024-05-28 MED ORDER — CALCIUM CHLORIDE 10 % IV SOLN
INTRAVENOUS | Status: DC | PRN
  Administered 2024-05-28 (×5): 250 mg via INTRAVENOUS

## 2024-05-28 MED ORDER — SODIUM CHLORIDE 0.45 % IV SOLN: INTRAVENOUS | Status: AC | PRN

## 2024-05-28 MED ORDER — EPHEDRINE SULFATE (PRESSORS) 25 MG/5ML IV SOSY
PREFILLED_SYRINGE | INTRAVENOUS | Status: DC | PRN
Start: 2024-05-28 — End: 2024-05-28
  Administered 2024-05-28 (×3): 5 mg via INTRAVENOUS
  Administered 2024-05-28: 10 mg via INTRAVENOUS

## 2024-05-28 MED ORDER — CHLORHEXIDINE GLUCONATE 0.12 % MT SOLN
15.0000 mL | OROMUCOSAL | Status: AC
  Administered 2024-05-28: 15 mL via OROMUCOSAL
  Filled 2024-05-28: qty 15

## 2024-05-28 MED ORDER — ROCURONIUM BROMIDE 10 MG/ML (PF) SYRINGE
PREFILLED_SYRINGE | INTRAVENOUS | Status: AC
  Filled 2024-05-28: qty 10

## 2024-05-28 MED ORDER — HEPARIN SODIUM (PORCINE) 1000 UNIT/ML IJ SOLN
INTRAMUSCULAR | Status: AC
  Filled 2024-05-28: qty 1

## 2024-05-28 MED ORDER — ACETAMINOPHEN 500 MG PO TABS: 1000.0000 mg | ORAL_TABLET | Freq: Four times a day (QID) | ORAL | Status: AC

## 2024-05-28 MED ORDER — SODIUM CHLORIDE 0.9% FLUSH
3.0000 mL | Freq: Two times a day (BID) | INTRAVENOUS | Status: DC
  Administered 2024-05-29 – 2024-06-04 (×12): 3 mL via INTRAVENOUS

## 2024-05-28 MED ORDER — POTASSIUM CHLORIDE 10 MEQ/50ML IV SOLN: 10.0000 meq | INTRAVENOUS | Status: AC

## 2024-05-28 MED ORDER — VASOPRESSIN 20 UNIT/ML IV SOLN
INTRAVENOUS | Status: DC | PRN
  Administered 2024-05-28: 1 [IU] via INTRAVENOUS

## 2024-05-28 MED ORDER — PROPOFOL 1000 MG/100ML IV EMUL
0.0000 ug/kg/min | INTRAVENOUS | Status: DC
  Administered 2024-05-28 – 2024-05-29 (×3): 60 ug/kg/min via INTRAVENOUS
  Administered 2024-05-29 – 2024-05-30 (×15): 70 ug/kg/min via INTRAVENOUS
  Administered 2024-05-31: 50 ug/kg/min via INTRAVENOUS
  Administered 2024-05-31: 60 ug/kg/min via INTRAVENOUS
  Administered 2024-05-31: 20 ug/kg/min via INTRAVENOUS
  Administered 2024-05-31: 70 ug/kg/min via INTRAVENOUS
  Administered 2024-05-31: 50 ug/kg/min via INTRAVENOUS
  Administered 2024-05-31: 70 ug/kg/min via INTRAVENOUS
  Administered 2024-06-01 (×2): 60 ug/kg/min via INTRAVENOUS
  Filled 2024-05-28 (×2): qty 100
  Filled 2024-05-28: qty 200
  Filled 2024-05-28: qty 100
  Filled 2024-05-28: qty 200
  Filled 2024-05-28: qty 100
  Filled 2024-05-28 (×3): qty 200
  Filled 2024-05-28 (×2): qty 100
  Filled 2024-05-28 (×2): qty 200
  Filled 2024-05-28 (×2): qty 100
  Filled 2024-05-28 (×2): qty 200
  Filled 2024-05-28 (×3): qty 100

## 2024-05-28 MED ORDER — FENTANYL BOLUS VIA INFUSION
25.0000 ug | INTRAVENOUS | Status: DC | PRN
  Administered 2024-05-28: 50 ug via INTRAVENOUS
  Administered 2024-05-29 – 2024-05-30 (×6): 100 ug via INTRAVENOUS
  Administered 2024-05-30: 50 ug via INTRAVENOUS
  Administered 2024-05-30 – 2024-05-31 (×2): 100 ug via INTRAVENOUS
  Administered 2024-05-31 (×4): 50 ug via INTRAVENOUS

## 2024-05-28 MED ORDER — FENTANYL 2500MCG IN NS 250ML (10MCG/ML) PREMIX INFUSION
0.0000 ug/h | INTRAVENOUS | Status: DC
  Administered 2024-05-28: 25 ug/h via INTRAVENOUS
  Administered 2024-05-29: 150 ug/h via INTRAVENOUS
  Administered 2024-05-30 (×2): 200 ug/h via INTRAVENOUS
  Administered 2024-05-31: 50 ug/h via INTRAVENOUS
  Administered 2024-05-31: 100 ug/h via INTRAVENOUS
  Administered 2024-05-31: 200 ug/h via INTRAVENOUS
  Administered 2024-06-01: 125 ug/h via INTRAVENOUS
  Filled 2024-05-28 (×6): qty 250

## 2024-05-28 MED ORDER — PANTOPRAZOLE SODIUM 40 MG PO TBEC: 40.0000 mg | DELAYED_RELEASE_TABLET | Freq: Every day | ORAL | Status: DC

## 2024-05-28 MED ORDER — METOPROLOL TARTRATE 5 MG/5ML IV SOLN: 2.5000 mg | INTRAVENOUS | Status: DC | PRN

## 2024-05-28 MED ORDER — PLASMA-LYTE A IV SOLN: INTRAVENOUS | Status: DC | PRN

## 2024-05-28 MED ORDER — VASOPRESSIN 20 UNITS/100 ML INFUSION FOR SHOCK
0.0400 [IU]/min | INTRAVENOUS | Status: DC
  Administered 2024-05-28: 0.03 [IU]/min via INTRAVENOUS
  Administered 2024-05-29: 0.02 [IU]/min via INTRAVENOUS
  Administered 2024-05-29 – 2024-05-31 (×5): 0.04 [IU]/min via INTRAVENOUS
  Filled 2024-05-28 (×7): qty 100

## 2024-05-28 MED ORDER — SODIUM CHLORIDE 0.9 % IV SOLN: 250.0000 mL | INTRAVENOUS | Status: AC

## 2024-05-28 MED ORDER — DOCUSATE SODIUM 50 MG/5ML PO LIQD
100.0000 mg | Freq: Two times a day (BID) | ORAL | Status: DC
  Administered 2024-05-28 – 2024-06-01 (×6): 100 mg
  Filled 2024-05-28 (×6): qty 10

## 2024-05-28 MED ORDER — BISACODYL 5 MG PO TBEC
10.0000 mg | DELAYED_RELEASE_TABLET | Freq: Every day | ORAL | Status: DC
  Administered 2024-06-04: 10 mg via ORAL
  Filled 2024-05-28 (×2): qty 2

## 2024-05-28 MED ORDER — DOCUSATE SODIUM 100 MG PO CAPS: 200.0000 mg | ORAL_CAPSULE | Freq: Every day | ORAL | Status: DC

## 2024-05-28 MED ORDER — PROTAMINE SULFATE 10 MG/ML IV SOLN
INTRAVENOUS | Status: DC | PRN
  Administered 2024-05-28: 340 mg via INTRAVENOUS
  Administered 2024-05-28: 300 mg via INTRAVENOUS

## 2024-05-28 MED ORDER — ASPIRIN 325 MG PO TBEC
325.0000 mg | DELAYED_RELEASE_TABLET | Freq: Every day | ORAL | Status: DC
  Administered 2024-06-03: 325 mg via ORAL
  Filled 2024-05-28 (×2): qty 1

## 2024-05-28 MED ORDER — TRAMADOL HCL 50 MG PO TABS: 50.0000 mg | ORAL_TABLET | ORAL | Status: DC | PRN

## 2024-05-28 MED ORDER — ROCURONIUM BROMIDE 10 MG/ML (PF) SYRINGE
PREFILLED_SYRINGE | INTRAVENOUS | Status: AC
Start: 2024-05-28 — End: 2024-05-28
  Filled 2024-05-28: qty 10

## 2024-05-28 MED ORDER — DOBUTAMINE-DEXTROSE 4-5 MG/ML-% IV SOLN: 2.5000 ug/kg/min | INTRAVENOUS | Status: DC

## 2024-05-28 MED ORDER — EPINEPHRINE HCL 5 MG/250ML IV SOLN IN NS
0.5000 ug/min | INTRAVENOUS | Status: DC
  Administered 2024-05-31: 5.5 ug/min via INTRAVENOUS
  Administered 2024-06-01: 7 ug/min via INTRAVENOUS
  Filled 2024-05-28 (×2): qty 250

## 2024-05-28 MED ORDER — 0.9 % SODIUM CHLORIDE (POUR BTL) OPTIME
TOPICAL | Status: DC | PRN
  Administered 2024-05-28: 5000 mL

## 2024-05-28 MED ORDER — NOREPINEPHRINE 4 MG/250ML-% IV SOLN
0.0000 ug/min | INTRAVENOUS | Status: DC
  Administered 2024-05-28: 5 ug/min via INTRAVENOUS
  Administered 2024-05-29: 4 ug/min via INTRAVENOUS
  Administered 2024-05-31: 2 ug/min via INTRAVENOUS
  Administered 2024-05-31: 6 ug/min via INTRAVENOUS
  Administered 2024-05-31: 10 ug/min via INTRAVENOUS
  Administered 2024-06-01: 2 ug/min via INTRAVENOUS
  Filled 2024-05-28 (×5): qty 250

## 2024-05-28 MED ORDER — METOPROLOL TARTRATE 12.5 MG HALF TABLET: 12.5000 mg | ORAL_TABLET | Freq: Two times a day (BID) | ORAL | Status: DC

## 2024-05-28 MED ORDER — DEXTROSE 50 % IV SOLN: 0.0000 mL | INTRAVENOUS | Status: DC | PRN

## 2024-05-28 MED ORDER — ACETAMINOPHEN 160 MG/5ML PO SOLN
650.0000 mg | Freq: Once | ORAL | Status: AC
  Administered 2024-05-28: 650 mg
  Filled 2024-05-28: qty 20.3

## 2024-05-28 MED ORDER — DEXMEDETOMIDINE HCL IN NACL 400 MCG/100ML IV SOLN: 0.0000 ug/kg/h | INTRAVENOUS | Status: DC

## 2024-05-28 MED ORDER — MAGNESIUM SULFATE 4 GM/100ML IV SOLN
4.0000 g | Freq: Once | INTRAVENOUS | Status: AC
  Administered 2024-05-28: 4 g via INTRAVENOUS
  Filled 2024-05-28: qty 100

## 2024-05-28 MED ORDER — SODIUM CHLORIDE (PF) 0.9 % IJ SOLN: OROMUCOSAL | Status: DC | PRN

## 2024-05-28 MED ORDER — EPINEPHRINE HCL 5 MG/250ML IV SOLN IN NS: 0.5000 ug/min | INTRAVENOUS | Status: DC

## 2024-05-28 MED ORDER — MORPHINE SULFATE (PF) 2 MG/ML IV SOLN
1.0000 mg | INTRAVENOUS | Status: DC | PRN
  Administered 2024-06-01 – 2024-06-02 (×2): 2 mg via INTRAVENOUS
  Filled 2024-05-28 (×2): qty 1

## 2024-05-28 MED ORDER — PROPOFOL 500 MG/50ML IV EMUL
INTRAVENOUS | Status: DC | PRN
  Administered 2024-05-28: 50 ug/kg/min via INTRAVENOUS

## 2024-05-28 MED ORDER — SODIUM CHLORIDE 0.9% FLUSH: 3.0000 mL | INTRAVENOUS | Status: DC | PRN

## 2024-05-28 MED ORDER — ALBUMIN HUMAN 5 % IV SOLN
12.5000 g | INTRAVENOUS | Status: DC | PRN
  Administered 2024-05-28 – 2024-05-31 (×6): 12.5 g via INTRAVENOUS
  Filled 2024-05-28 (×2): qty 250

## 2024-05-28 MED ORDER — CLEVIDIPINE BUTYRATE 0.5 MG/ML IV EMUL
INTRAVENOUS | Status: AC
Start: 2024-05-28 — End: 2024-05-28
  Filled 2024-05-28: qty 50

## 2024-05-28 MED ORDER — HEPARIN SODIUM (PORCINE) 1000 UNIT/ML IJ SOLN
INTRAMUSCULAR | Status: DC | PRN
  Administered 2024-05-28 (×2): 34000 [IU] via INTRAVENOUS

## 2024-05-28 MED ORDER — MIDAZOLAM HCL (PF) 2 MG/2ML IJ SOLN
2.0000 mg | INTRAMUSCULAR | Status: DC | PRN
Start: 2024-05-28 — End: 2024-06-01
  Administered 2024-05-29 – 2024-05-31 (×13): 2 mg via INTRAVENOUS
  Filled 2024-05-28 (×14): qty 2

## 2024-05-28 MED FILL — Nitroglycerin IV Soln 100 MCG/ML in D5W: INTRA_ARTERIAL | Qty: 10 | Status: AC

## 2024-05-28 SURGICAL SUPPLY — 82 items
ADAPTER MULTI PERFUSION 15 (ADAPTER) ×3 IMPLANT
BAG DECANTER FOR FLEXI CONT (MISCELLANEOUS) ×3 IMPLANT
BENZOIN TINCTURE PRP APPL 2/3 (GAUZE/BANDAGES/DRESSINGS) IMPLANT
BLADE STERNUM SYSTEM 6 (BLADE) ×3 IMPLANT
BLADE SURG 11 STRL SS (BLADE) IMPLANT
BNDG COMPR ESMARK 6X3 LF (GAUZE/BANDAGES/DRESSINGS) IMPLANT
BNDG ELASTIC 4INX 5YD STR LF (GAUZE/BANDAGES/DRESSINGS) IMPLANT
BNDG ELASTIC 6INX 5YD STR LF (GAUZE/BANDAGES/DRESSINGS) ×3 IMPLANT
BNDG GAUZE DERMACEA FLUFF 4 (GAUZE/BANDAGES/DRESSINGS) ×3 IMPLANT
CANISTER SUCTION 3000ML PPV (SUCTIONS) ×3 IMPLANT
CANNULA AORTIC ROOT 9FR (CANNULA) ×3 IMPLANT
CANNULA MC2 2 STG 29/37 NON-V (CANNULA) ×3 IMPLANT
CANNULA NON VENT 20FR 12 (CANNULA) IMPLANT
CANNULA NON VENT 22FR 12 (CANNULA) IMPLANT
CANNULA TRIPLE STAGE 29X29X29 (MISCELLANEOUS) IMPLANT
CATH ROBINSON RED A/P 18FR (CATHETERS) ×6 IMPLANT
CONNECTOR BLAKE 2:1 CARIO BLK (MISCELLANEOUS) ×3 IMPLANT
CONNECTOR STRAIGHT 3/8 (MISCELLANEOUS) IMPLANT
CONTAINER PROTECT SURGISLUSH (MISCELLANEOUS) ×6 IMPLANT
DERMABOND ADVANCED .7 DNX12 (GAUZE/BANDAGES/DRESSINGS) IMPLANT
DRAIN CHANNEL 19F RND (DRAIN) ×6 IMPLANT
DRAIN CHANNEL 28F RND 3/8 FF (WOUND CARE) IMPLANT
DRAIN CONNECTOR BLAKE 1:1 (MISCELLANEOUS) IMPLANT
DRAPE INCISE IOBAN 66X45 STRL (DRAPES) IMPLANT
DRAPE SRG 135X102X78XABS (DRAPES) ×3 IMPLANT
DRAPE WARM FLUID 44X44 (DRAPES) ×3 IMPLANT
DRESSING AQUACEL AG SP 3.5X10 (GAUZE/BANDAGES/DRESSINGS) IMPLANT
DRSG AQUACEL AG ADV 3.5X10 (GAUZE/BANDAGES/DRESSINGS) ×3 IMPLANT
ELECTRODE BLDE 4.0 EZ CLN MEGD (MISCELLANEOUS) ×3 IMPLANT
ELECTRODE REM PT RTRN 9FT ADLT (ELECTROSURGICAL) ×6 IMPLANT
FELT TEFLON 1X6 (MISCELLANEOUS) ×3 IMPLANT
GAUZE SPONGE 4X4 12PLY STRL (GAUZE/BANDAGES/DRESSINGS) ×6 IMPLANT
GAUZE SPONGE 4X4 12PLY STRL LF (GAUZE/BANDAGES/DRESSINGS) IMPLANT
GLOVE BIO SURGEON STRL SZ7 (GLOVE) IMPLANT
GLOVE BIOGEL M STRL SZ7.5 (GLOVE) IMPLANT
GLOVE BIOGEL PI IND STRL 7.0 (GLOVE) IMPLANT
GOWN STRL REUS W/ TWL LRG LVL3 (GOWN DISPOSABLE) ×12 IMPLANT
GOWN STRL REUS W/ TWL XL LVL3 (GOWN DISPOSABLE) ×6 IMPLANT
GOWN STRL SURGICAL XL XLNG (GOWN DISPOSABLE) ×6 IMPLANT
HEMOSTAT POWDER SURGIFOAM 1G (HEMOSTASIS) ×6 IMPLANT
INSERT FOGARTY XLG (MISCELLANEOUS) ×3 IMPLANT
INSERT SUTURE HOLDER (MISCELLANEOUS) ×3 IMPLANT
KIT BASIN OR (CUSTOM PROCEDURE TRAY) ×3 IMPLANT
KIT TURNOVER KIT B (KITS) ×3 IMPLANT
KIT VASOVIEW HEMOPRO 2 VH 4000 (KITS) ×3 IMPLANT
LEAD PACING MYOCARDI (MISCELLANEOUS) ×3 IMPLANT
MARKER DISTAL GRAFT W/ HOLDER (MISCELLANEOUS) ×9 IMPLANT
PACK E OPEN HEART (SUTURE) ×3 IMPLANT
PACK OPEN HEART (CUSTOM PROCEDURE TRAY) ×3 IMPLANT
PAD ARMBOARD POSITIONER FOAM (MISCELLANEOUS) ×6 IMPLANT
PAD ELECT DEFIB RADIOL ZOLL (MISCELLANEOUS) ×3 IMPLANT
PENCIL BUTTON HOLSTER BLD 10FT (ELECTRODE) ×3 IMPLANT
POSITIONER HEAD DONUT 9IN (MISCELLANEOUS) ×3 IMPLANT
PUNCH AORTIC ROTATE 4.0MM (MISCELLANEOUS) ×3 IMPLANT
SET MPS 3-ND DEL (MISCELLANEOUS) IMPLANT
SOLN 0.9% NACL POUR BTL 1000ML (IV SOLUTION) ×15 IMPLANT
SOLN STERILE WATER BTL 1000 ML (IV SOLUTION) ×6 IMPLANT
SOLUTION ANTFG W/FOAM PAD STRL (MISCELLANEOUS) IMPLANT
SUPPORT HEART JANKE-BARRON (MISCELLANEOUS) ×3 IMPLANT
SUT ETHIBOND 2 0 SH 36X2 (SUTURE) IMPLANT
SUT ETHIBOND X763 2 0 SH 1 (SUTURE) ×6 IMPLANT
SUT MNCRL AB 3-0 PS2 18 (SUTURE) ×6 IMPLANT
SUT MNCRL AB 4-0 PS2 18 (SUTURE) IMPLANT
SUT PDS AB 1 CTX 36 (SUTURE) ×6 IMPLANT
SUT PROLENE 2 0 MH 48 (SUTURE) IMPLANT
SUT PROLENE 4 0 SH DA (SUTURE) ×3 IMPLANT
SUT PROLENE 5 0 C 1 36 (SUTURE) ×9 IMPLANT
SUT PROLENE 7 0 BV 1 (SUTURE) IMPLANT
SUT PROLENE 7 0 BV1 MDA (SUTURE) ×3 IMPLANT
SUT SILK 1 MH (SUTURE) IMPLANT
SUT STEEL 6MS V (SUTURE) IMPLANT
SUT VIC AB 2-0 CT1 TAPERPNT 27 (SUTURE) IMPLANT
SYSTEM SAHARA CHEST DRAIN ATS (WOUND CARE) ×3 IMPLANT
TAPE CLOTH SURG 4X10 WHT LF (GAUZE/BANDAGES/DRESSINGS) IMPLANT
TAPE PAPER 2X10 WHT MICROPORE (GAUZE/BANDAGES/DRESSINGS) IMPLANT
TOWEL GREEN STERILE (TOWEL DISPOSABLE) ×3 IMPLANT
TOWEL GREEN STERILE FF (TOWEL DISPOSABLE) ×3 IMPLANT
TRAY FOLEY SLVR 16FR TEMP STAT (SET/KITS/TRAYS/PACK) ×3 IMPLANT
TUBE SUCT INTRACARD DLP 20F (MISCELLANEOUS) ×3 IMPLANT
TUBE SUCTION CARDIAC 10FR (CANNULA) ×3 IMPLANT
TUBING LAP HI FLOW INSUFFLATIO (TUBING) ×3 IMPLANT
UNDERPAD 30X36 HEAVY ABSORB (UNDERPADS AND DIAPERS) ×3 IMPLANT

## 2024-05-28 NOTE — Consult Note (Addendum)
 NAME:  Thomas [name], MRN:  [id number], DOB:  [birth year], LOS: 1 ADMISSION DATE:  05/27/2024, CONSULTATION DATE:  05/28/2024 REFERRING MD:  Dr. Shyrl, CHIEF COMPLAINT:  s/p CABG, post-op vent management  History of Present Illness:  Thomas [name] is a 43 year old male with history of CAD c/b cardiac arrest s/p PCI to OM1 in 2017, s/p right nephrectomy, CKD stage IIIa, HIV (on HAART), Hogdkin's lymphoma (s/p chemo now in remission) who was admitted 05/27/2024 with chest pain and found to have lateral STEMI. He underwent LHC which showed 20% distal LM, 99% ostial LAD, 30% mid LCx, and 30% proximal RCA disease with likely chronically occluded mid LAD and OM1 stent. He underwent successful PTCA to ostial LAD and IABP placement for stabilization. TCTS was consulted for consideration of CABG.  On 11/5 he underwent CABGx2 (LIMA-diag, SVG-OM) with Dr. Shyrl. Intra-op course notable for vasoplegia and cardioplegia with inability to come off bypass due to hypotension. Patient transitioned to central VA ECMO via RA and aortic cannulation and was transferred to [facility] ICU for further management.    Intra-op he received 710 mL cell saver, 2 PRBC and 1.5L LR. UOP 575 mL. Aortic cross clamp time 53 minutes, CPB time 119 minutes. TEE showed normal RV, LV dysfunction. Pre-op EF 30-35% on epi and IABP.   Pertinent  Medical History  Per above  Significant Hospital Events: Including procedures, antibiotic start and stop dates in addition to other pertinent events   11/4 admitted for lateral STEMI underwent successful PTCA to ostial LAD   Interim History / Subjective:  OR s/p CABGx2 (LIMA-diag, SVG-OM). Intra-op unable to come off pump due to profound hypotension requiring VA ECMO. TEE with normal RV, LV dysfunction.   Objective    Blood pressure (!) 119/93, pulse (!) 229, temperature 98.8 F (37.1 C), temperature source Oral, resp. rate 16, height 6' 1 (1.854 m), weight 94.8 kg, SpO2 97%.         Intake/Output Summary (Last 24 hours) at 05/28/2024 1206 Last data filed at 05/28/2024 1116 Gross per 24 hour  Intake 1719.91 ml  Output 2125 ml  Net -405.09 ml   Filed Weights   05/27/24 0728  Weight: 94.8 kg    Examination: General: critically ill appearing male, intubated and sedated HENT: [place]/AT, sclera anicteric, pupils 1 mm and reactive bilaterally Lungs: on mechanical ventilation, synchronous, coarse breath sounds  Cardiovascular: regular rate and rhythm, muffled heart sounds  Abdomen: soft, non-distended  Extremities: warm, dry, no edema  Neuro: intubated and sedated, pupils 1 mm and reactive  GU: foley in place draining clear yellow urine   VA ECMO Flow: 4.4 L/min  Delta P: 25 Sweep: 3  Resolved problem list   Assessment and Plan   Acute post-bypass cardiogenic shock s/p central VA ECMO CAD s/p CABG x2 (LIMA-diag, SVG-OM) Acute post-bypass vasoplegia Acute lateral STEMI s/p PCI and IABP  Presented 11/4 with lateral STEMI s/p PTCA to ostial LAD. Also found to have occluded mid LAD and OM1, IABP placed. 11/5 underwent CABG x 2 with post-op cardiogenic shock in the setting of LV dysfunction requiring central VA ECMO cannulation.  - post-op management per TCTS - advanced heart failure following  - continue VA ECMO with flow goals 5-5.5, sweep titration for goal PaCO2 35-45. May consider transitioning to Impella tomorrow  - continue levophed  as needed maintain MAP>70-90 - continue milrinone 0.375 mcg/kg/min for now if increasing levophed  requirements, will discuss decreasing dose or resuming epi with advanced heart failure  -  continue IABP 1:1 while not on anti-coagulation, given central ECMO cannulation will plan to remove at bedside pending coags  - albumin boluses PRN as indicated by hemodynamic markers of perfusion  - tele monitoring/ pacing prn - mediastinal drains per TCTS - ASA and statin to resume tomorrow as able based on bleeding and LFTs - holding  metoprolol  with cardiogenic shock, consider resuming as appropriate  - holding systemic AC given immediately post-op  - complete post-op antibiotics - monitor electrolytes, replete PRN  Post-operative mechanical ventilation  Currently on PC 15/10, RR 10 and FiO2 0.50. Tidal volumes 400s.  - Continue current ventilator settings  - CXR/ ABG now  - Sweep titration per above  - Continue PPI for GI prophylaxis   Angalosedation Acute post-operative pain - PAD protocol with propofol  and fentanyl , wean as able to obtain neuro exam  - Multimodal pain control per protocol- acetaminophen , oxycodone , tramadol, morphine  with bowel regimen  CKD stage IIIa S/p right nephrectomy (04/30/2014) Baseline Cr ~1.7-2.0.  - BMP now and daily  - Resuscitation with albumin per protocol and as indicated by hemodynamic markers of perfusion   Expected post-operative acute blood loss anemia Expected post-operative consumptive thrombocytopenia  Received 2 PRBC intra-op. - Give 1 PRBC now - Check CBC, coags and TEG now - Continue to monitor chest tube output - Correction of coagulopathy and transfusion of PRBCs as indicated for bleeding   History of HIV On cebenuva at home, last injection ~1.5 months ago.  - Will discuss plan with pharmacy, can likely resume home schedule at discharge   Labs   CBC: Recent Labs  Lab 05/27/24 0740 05/27/24 0743 05/27/24 1139 05/28/24 0243 05/28/24 0956 05/28/24 1055 05/28/24 1126 05/28/24 1131 05/28/24 1146 05/28/24 1157  WBC 5.2  --  5.6 6.2  --   --   --   --   --   --   NEUTROABS 2.7  --  3.1  --   --   --   --   --   --   --   HGB 13.8   < > 14.6 13.4   < > 10.9* 8.8* 8.8* 8.9* 8.8*  HCT 40.7   < > 44.5 40.2   < > 32.0* 26.0* 26.0* 27.1* 26.0*  MCV 98.5  --  100.7* 100.0  --   --   --   --   --   --   PLT 187  --  204 176  --   --   --   --  109*  --    < > = values in this interval not displayed.    Basic Metabolic Panel: Recent Labs  Lab 05/27/24 0740  05/27/24 0743 05/27/24 1139 05/28/24 0243 05/28/24 0956 05/28/24 1000 05/28/24 1055 05/28/24 1126 05/28/24 1131 05/28/24 1157  NA 140   < > 142 139   < > 140 140 140 140 139  K 3.2*   < > 3.7 3.6   < > 3.6 3.9 3.7 4.7 4.9  CL 107   < > 107 107  --  108 107  --   --  107  CO2 21*  --  23 21*  --   --   --   --   --   --   GLUCOSE 104*   < > 123* 95  --  96 105*  --   --  109*  BUN 18   < > 19 13  --  12 13  --   --  13  CREATININE 1.80*   < > 1.80* 1.53*  --  1.60* 1.80*  --   --  1.50*  CALCIUM  8.3*  --  8.6* 8.5*  --   --   --   --   --   --    < > = values in this interval not displayed.   GFR: Estimated Creatinine Clearance: 73.2 mL/min (A) (by C-G formula based on SCr of 1.5 mg/dL (H)). Recent Labs  Lab 05/27/24 0740 05/27/24 0743 05/27/24 0840 05/27/24 1139 05/28/24 0243  WBC 5.2  --   --  5.6 6.2  LATICACIDVEN  --  0.8 0.6 1.4  --     Liver Function Tests: Recent Labs  Lab 05/27/24 0740 05/27/24 1139  AST 28 29  ALT 22 22  ALKPHOS 43 45  BILITOT 0.6 0.7  PROT 6.2* 6.4*  ALBUMIN 2.7* 3.0*   No results for input(s): LIPASE, AMYLASE in the last 168 hours. No results for input(s): AMMONIA in the last 168 hours.  ABG    Component Value Date/Time   PHART 7.341 (L) 05/28/2024 1126   PCO2ART 37.1 05/28/2024 1126   PO2ART 333 (H) 05/28/2024 1126   HCO3 20.6 05/28/2024 1131   TCO2 24 05/28/2024 1157   ACIDBASEDEF 5.0 (H) 05/28/2024 1131   O2SAT 79 05/28/2024 1131     Coagulation Profile: Recent Labs  Lab 05/27/24 0740 05/27/24 1139  INR 1.0 1.0    Cardiac Enzymes: No results for input(s): CKTOTAL, CKMB, CKMBINDEX, TROPONINI in the last 168 hours.  HbA1C: Hgb A1c MFr Bld  Date/Time Value Ref Range Status  05/27/2024 11:39 AM 5.2 4.8 - 5.6 % Final    Comment:    (NOTE) Diagnosis of Diabetes The following HbA1c ranges recommended by the American Diabetes Association (ADA) may be used as an aid in the diagnosis of diabetes  mellitus.  Hemoglobin             Suggested A1C NGSP%              Diagnosis  <5.7                   Non Diabetic  5.7-6.4                Pre-Diabetic  >6.4                   Diabetic  <7.0                   Glycemic control for                       adults with diabetes.    05/27/2024 07:40 AM 5.2 4.8 - 5.6 % Final    Comment:    (NOTE) Diagnosis of Diabetes The following HbA1c ranges recommended by the American Diabetes Association (ADA) may be used as an aid in the diagnosis of diabetes mellitus.  Hemoglobin             Suggested A1C NGSP%              Diagnosis  <5.7                   Non Diabetic  5.7-6.4                Pre-Diabetic  >6.4                   Diabetic  <  7.0                   Glycemic control for                       adults with diabetes.      CBG: No results for input(s): GLUCAP in the last 168 hours.  Review of Systems:   Unable to obtain, patient intubated and sedated  Past Medical History:  He,  has a past medical history of Acute respiratory failure with hypoxemia: post cardiac arrest (05/19/2014), Cancer [facility]), Cardiac arrest (HCC) (05/15/2014), Chronic renal insufficiency, CKD (chronic kidney disease) s/p Right nephrectomy (04/30/2014) (05/19/2014), CLABSI (central line-associated bloodstream infection) (02/27/2014), Enteritis (01/22/2014), History of blood transfusion (july 2015 per pt), HIV disease (HCC) (06/25/2012), Hydronephrosis, right & hydroureter: secondary to obstructing ureteral calculi (03/01/2014), Leg pain (07/03/2012), Nephrolithiasis, NSTEMI (non-ST elevated myocardial infarction) (HCC) (05/19/2014), Obstructive uropathy (12/12/2014), Penile cyst (08/06/2012), Septic shock (HCC) (01/18/2014), Shingles rash (02/25/2014), Sinus tachycardia (08/06/2012), Skin lesion (07/03/2012), Staphylococcus aureus bacteremia (02/25/2014), and Transaminitis (05/19/2014).   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  this after several previous I and Ds of rectal abcess.    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   CARDIAC CATHETERIZATION  05/19/2014   CARDIAC CATHETERIZATION N/A 10/28/2015   Procedure: Left Heart Cath and Coronary Angiography;  Surgeon: Gordy Bergamo, MD;  Location: [facility] INVASIVE CV LAB;  Service: Cardiovascular;  Laterality: N/A;   CORONARY STENT PLACEMENT  05/19/2014   OMI   & PROXIMAL CIRCUMFLEX   CORONARY/GRAFT ACUTE MI REVASCULARIZATION N/A 05/27/2024   Procedure: Coronary/Graft Acute MI Revascularization;  Surgeon: Elmira Newman PARAS, MD;  Location: [facility] INVASIVE CV LAB;  Service: Cardiovascular;  Laterality: N/A;   CYSTOSCOPY WITH RETROGRADE PYELOGRAM, URETEROSCOPY AND STENT PLACEMENT Left 12/11/2014   Procedure: CYSTOSCOPY WITH RETROGRADE PYELOGRAM, URETEROSCOPY, STONE REMOVAL, AND STENT PLACEMENT;  Surgeon: Mark Ottelin, MD;  Location: [facility] ORS;  Service: Urology;  Laterality: Left;   HOLMIUM LASER APPLICATION Left 12/11/2014   Procedure: HOLMIUM LASER APPLICATION;  Surgeon: Mark Ottelin, MD;  Location: [facility] ORS;  Service: Urology;  Laterality: Left;   IABP INSERTION N/A 05/27/2024   Procedure: IABP Insertion;  Surgeon: Elmira Newman PARAS, MD;  Location: [facility] INVASIVE CV LAB;  Service: Cardiovascular;  Laterality: N/A;   LAPAROSCOPIC NEPHRECTOMY Right 04/30/2014   Procedure: LAPAROSCOPIC NEPHRECTOMY AND URETERECTOMY;  Surgeon: Gretel Ferrara, MD;  Location: [facility] ORS;  Service: Urology;  Laterality: Right;   LEFT HEART CATH AND CORONARY ANGIOGRAPHY N/A 05/27/2024   Procedure: LEFT HEART CATH AND CORONARY ANGIOGRAPHY;  Surgeon: Elmira Newman PARAS, MD;  Location: [facility] INVASIVE CV LAB;  Service: Cardiovascular;  Laterality: N/A;   LEFT HEART CATHETERIZATION WITH CORONARY ANGIOGRAM N/A 05/18/2014   Procedure: LEFT HEART CATHETERIZATION WITH CORONARY ANGIOGRAM;  Surgeon: Erick JONELLE Bergamo, MD;  Location: [facility] CATH LAB;   Service: Cardiovascular;  Laterality: N/A;   pac placed and later removed     PERCUTANEOUS CORONARY STENT INTERVENTION (PCI-S) N/A 05/19/2014   Procedure: PERCUTANEOUS CORONARY STENT INTERVENTION (PCI-S);  Surgeon: Erick JONELLE Bergamo, MD;  Location: [facility] CATH LAB;  Service: Cardiovascular;  Laterality: N/A;   right nephrostomy tube  for last month and 1/2   SUPRACLAVICAL NODE BIOPSY Right 08/08/2013   Procedure: SUPRACLAVICULAR LYMPH NODE BIOPSY;  Surgeon: Dorise MARLA Fellers, MD;  Location: [facility] OR;  Service: Thoracic;  Laterality: Right;   TEE WITHOUT CARDIOVERSION N/A 02/27/2014   Procedure: TRANSESOPHAGEAL ECHOCARDIOGRAM (TEE);  Surgeon: Vinie KYM Maxcy, MD;  Location: [facility] ENDOSCOPY;  Service: Cardiovascular;  Laterality: N/A;     Social History:   reports that he has quit smoking. His smoking use included cigarettes. He has never used smokeless tobacco. He reports that he does not drink alcohol and does not use drugs.   Family History:  His family history includes Diabetes in his mother.   Allergies Allergies  Allergen Reactions   Tylenol  [Acetaminophen ] Other (See Comments)    Cold sweats     Home Medications  Prior to Admission medications   Medication Sig Start Date End Date Taking? Authorizing Provider  ASPIR-LOW 81 MG EC tablet TAKE ONE TABLET BY MOUTH DAILY Patient taking differently: Take 162 mg by mouth daily. 02/18/21  Yes Ladona Heinz, MD  cabotegravir  & rilpivirine  ER (CABENUVA ) 600 & 900 MG/3ML injection Inject 1 kit into the muscle every 2 (two) months.   Yes [provider]     Critical care time: 72     The patient is critically ill with multiple organ system failure and requires high complexity decision making for assessment and support, frequent evaluation and titration of therapies, advanced monitoring, review of radiographic studies and interpretation of complex data.   Critical Care Time devoted to patient care services, exclusive of separately billable  procedures, described in this note is 45 minutes.  Rexene LOISE Blush, PA-C [facility] 05/28/24 12:16 PM  Please see [email or web address] for pager details.  From 7A-7P if no response, please call [phone] After hours, please call ELink [phone]

## 2024-05-28 NOTE — Progress Notes (Signed)
     [address]       [place] [address]             [phone]       No events Vitals:   05/28/24 0700 05/28/24 0727  BP: 112/81   Pulse: 80   Resp: 19   Temp:  98.8 F (37.1 C)  SpO2: 98%    Alert NAD Sinus EWOB  OR for CABG

## 2024-05-28 NOTE — Hospital Course (Addendum)
 History of Present Illness:  Thomas [name] is a 43 yo AA male with known history of CAD with previous cardiac arrest with PCI to OM in 2017, CKD Stage 3 S/P Right Nephrectomy, H/O Hodgkins Lymphoma, and HIV.  He presented to [facility] on 11/4 with complaints of chest pain that has been occurring off and on over the past several days.  EKG was obtained and showed lateral ST elevation with diffuse ST depression.  Chest pain improved on arrival, however he was ruled in for STEMI.  Hospital Course:  He was admitted and taken for urgent catheterization which showed 2V CAD.  Intra-Aortic Balloon pump was placed. It was felt coronary bypass grafting would be indicated and TCTS consult was obtained.  He was evaluated by Dr. Shyrl who was in agreement coronary bypass grafting would be indicated.  The risks and benefits of the procedure were explained to the patient and he was agreeable to proceed.  He was taken to the operating room and underwent Coronary bypass grafting x 2 utilizing LIMA to Diagonal and SVG to OM.  He also underwent endoscopic harvest greater saphenous vein from right leg.  He developed post cardiotomy shock post separation from bypass.  He required initiation of VA ECMO.  He tolerated the procedure and was taken to the SICU in critical condition. An Impella 5.5 was then placed on 11/06. He had a bronchoscopy the evening of 11/06 for mucous plugging. He remained on Epinephrine , Levophed , Milrinone, and Vasopressin  drips. ECMO was weaned and decannulation was done 11/07. Sedation was weaned and vent was attempted to wean on 11/08 but he did not tolerate spontaneous breathing trial. He was diuresed. He was extubated the morning of 11/09. Norepinephrine  was weaned off. He remained on Vasopressin , Epinephrine  and Milrinone drips. He remained in sinus tachycardia, epicardial pacing wires and chest tubes were removed without complication 11/09.

## 2024-05-28 NOTE — Progress Notes (Signed)
 ECMO INITIATION   Patient: Thomas [name], 23-Jun-1981, 43 y.o. Location:   Date of Service:  05/28/2024     Time: 3:26 PM  Date of Admission: 05/27/2024 Admitting diagnosis: Cardiogenic shock (HCC)  Ht: 6' 1 (185.4 cm) Wt: 94.8 kg BSA: Body surface area is 2.21 meters squared.  Blood Type: O POS Allergies:  Allergies  Allergen Reactions   Tylenol  [Acetaminophen ] Other (See Comments)    Cold sweats    Past medical history:  Past Medical History:  Diagnosis Date   Acute respiratory failure with hypoxemia: post cardiac arrest 05/19/2014   Cancer (HCC)    hodgkins, no current treatment for   Cardiac arrest (HCC) 05/15/2014   Chronic renal insufficiency    CKD (chronic kidney disease) s/p Right nephrectomy (04/30/2014) 05/19/2014   CLABSI (central line-associated bloodstream infection) 02/27/2014   Enteritis 01/22/2014   History of blood transfusion july 2015 per pt   HIV disease (HCC) 06/25/2012   Hydronephrosis, right & hydroureter: secondary to obstructing ureteral calculi 03/01/2014   Leg pain 07/03/2012   Nephrolithiasis    NSTEMI (non-ST elevated myocardial infarction) (HCC) 05/19/2014   Obstructive uropathy 12/12/2014   Status post left renal stent   Penile cyst 08/06/2012   Septic shock (HCC) 01/18/2014   Shingles rash 02/25/2014   Sinus tachycardia 08/06/2012   Skin lesion 07/03/2012   Staphylococcus aureus bacteremia 02/25/2014   Transaminitis 05/19/2014   Past surgical history:  Past Surgical History:  Procedure Laterality Date   ABCESS DRAINAGE  08/2008   drainage of perirectal 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  this after several previous I and Ds of rectal abcess.    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   CARDIAC CATHETERIZATION  05/19/2014   CARDIAC CATHETERIZATION N/A 10/28/2015   Procedure: Left Heart Cath and Coronary Angiography;  Surgeon: Gordy Bergamo, MD;  Location: [facility] INVASIVE CV LAB;  Service: Cardiovascular;  Laterality: N/A;   CORONARY STENT PLACEMENT   05/19/2014   OMI   & PROXIMAL CIRCUMFLEX   CORONARY/GRAFT ACUTE MI REVASCULARIZATION N/A 05/27/2024   Procedure: Coronary/Graft Acute MI Revascularization;  Surgeon: Elmira Newman PARAS, MD;  Location: [facility] INVASIVE CV LAB;  Service: Cardiovascular;  Laterality: N/A;   CYSTOSCOPY WITH RETROGRADE PYELOGRAM, URETEROSCOPY AND STENT PLACEMENT Left 12/11/2014   Procedure: CYSTOSCOPY WITH RETROGRADE PYELOGRAM, URETEROSCOPY, STONE REMOVAL, AND STENT PLACEMENT;  Surgeon: Mark Ottelin, MD;  Location: [facility] ORS;  Service: Urology;  Laterality: Left;   HOLMIUM LASER APPLICATION Left 12/11/2014   Procedure: HOLMIUM LASER APPLICATION;  Surgeon: Mark Ottelin, MD;  Location: [facility] ORS;  Service: Urology;  Laterality: Left;   IABP INSERTION N/A 05/27/2024   Procedure: IABP Insertion;  Surgeon: Elmira Newman PARAS, MD;  Location: [facility] INVASIVE CV LAB;  Service: Cardiovascular;  Laterality: N/A;   LAPAROSCOPIC NEPHRECTOMY Right 04/30/2014   Procedure: LAPAROSCOPIC NEPHRECTOMY AND URETERECTOMY;  Surgeon: Gretel Ferrara, MD;  Location: [facility] ORS;  Service: Urology;  Laterality: Right;   LEFT HEART CATH AND CORONARY ANGIOGRAPHY N/A 05/27/2024   Procedure: LEFT HEART CATH AND CORONARY ANGIOGRAPHY;  Surgeon: Elmira Newman PARAS, MD;  Location: [facility] INVASIVE CV LAB;  Service: Cardiovascular;  Laterality: N/A;   LEFT HEART CATHETERIZATION WITH CORONARY ANGIOGRAM N/A 05/18/2014   Procedure: LEFT HEART CATHETERIZATION WITH CORONARY ANGIOGRAM;  Surgeon: Erick JONELLE Bergamo, MD;  Location: [facility] CATH LAB;  Service: Cardiovascular;  Laterality: N/A;   pac placed and later removed     PERCUTANEOUS CORONARY STENT INTERVENTION (PCI-S) N/A 05/19/2014   Procedure: PERCUTANEOUS CORONARY STENT  INTERVENTION (PCI-S);  Surgeon: Erick JONELLE Bergamo, MD;  Location: [facility] CATH LAB;  Service: Cardiovascular;  Laterality: N/A;   right nephrostomy tube  for last month and 1/2   SUPRACLAVICAL NODE BIOPSY Right 08/08/2013   Procedure: SUPRACLAVICULAR LYMPH NODE BIOPSY;   Surgeon: Dorise MARLA Fellers, MD;  Location: [facility] OR;  Service: Thoracic;  Laterality: Right;   TEE WITHOUT CARDIOVERSION N/A 02/27/2014   Procedure: TRANSESOPHAGEAL ECHOCARDIOGRAM (TEE);  Surgeon: Vinie KYM Maxcy, MD;  Location: [facility] ENDOSCOPY;  Service: Cardiovascular;  Laterality: N/A;    Indication for ECMO: Post-Cardiotomy  ECMO was deployed at 1249 and initiated at 1318  Cannulated for VA  and achieved initial ECMO  5.0 LPM  and ECMO 2 Sweep  .    ECMO Cannula Information     Staff Present  Primary Perfusionist Signe Farr   Assisting Perfusionist/ECMO Specialist Johnnie Aid RN  Cannulating Physician Linnie Rayas MD   ECMO Lot Numbers  CardioHelp Console  [id number]  Oxygenator  [id number]  Tubing Pack  [id number]  ECMO Goals  Flow goal    4.0-5.0  Anticoagulation goal    N/A  Cardiac goal    MAP > 65  Respiratory goal    O2 > 88%  Other goal       ECMO Handoff  Patient Information * Age Height Weight BSA IBW BMI  43 y.o. 6' 1 (185.4 cm)  (94.8 kg Body surface area is 2.21 meters squared. No data recorded Body mass index is 27.57 kg/m.   Review History * Primary Diagnosis   Cardiogenic shock (HCC)  Prior Cardiac Arrest within 24hrs of ECMO initiation? No  ECMO and MCS * Type ECMO Flow ECMO Sweep Gases   VA    5.0 LPM    2 Sweep      Additional Mechanical Support   Ventilation *    $ Ventilator Initial/Subsequent : Initial, Vent Mode: SIMV; PRVC; PSV, Vt Set: 640 mL, Set Rate: 16 bmp, FiO2 (%): 50 %, I Time: 0.8 Sec(s), PEEP: 5 cmH20     Cannula Size and Locations       Drainage RA 29Fr Trple Stage Liva Nova    Return Ao 20Fr Medtronic EOPA    *Cannula(e) sutured and anchored, secured and dressed.   Labs and Imaging *  *Cannulation position verified via imaging on arrival to ICU. Concerns communicated to attending surgeon. Labs reviewed.   All ECMO safety checks complete. ECMO flowsheet initiated, applicable charges captured, LDA's  entered/confirmed, imaging and labs verified, blood products available, and report given to Hobert Finder RT.

## 2024-05-28 NOTE — Progress Notes (Signed)
  TCTS Evening Rounds:   Hemodynamically labile on VA ECMO. Milrinone 0.375, NE 8, Epi 4. Flow 4.5. IABP removed by Dr. Cherrie this evening.  Sedated on vent with open chest.   Urine output good  CT output low  CBC    Component Value Date/Time   WBC 9.6 05/28/2024 1528   RBC 3.42 (L) 05/28/2024 1528   HGB 8.5 (L) 05/28/2024 1718   HGB 15.5 12/03/2018 1330   HGB 15.1 10/07/2015 1442   HCT 25.0 (L) 05/28/2024 1718   HCT 43.4 12/03/2018 1330   HCT 46.6 10/07/2015 1442   PLT 90 (L) 05/28/2024 1528   PLT 177 12/03/2018 1330   MCV 98.2 05/28/2024 1528   MCV 101 (H) 12/03/2018 1330   MCV 106.9 (H) 10/07/2015 1442   MCH 32.7 05/28/2024 1528   MCHC 33.3 05/28/2024 1528   RDW 15.0 05/28/2024 1528   RDW 12.9 12/03/2018 1330   RDW 12.8 10/07/2015 1442   LYMPHSABS 2.1 05/27/2024 1139   LYMPHSABS 1.7 10/07/2015 1442   MONOABS 0.3 05/27/2024 1139   MONOABS 0.3 10/07/2015 1442   EOSABS 0.0 05/27/2024 1139   EOSABS 0.0 10/07/2015 1442   BASOSABS 0.0 05/27/2024 1139   BASOSABS 0.0 10/07/2015 1442     BMET    Component Value Date/Time   NA 145 05/28/2024 1718   NA 142 12/03/2018 1330   NA 141 10/07/2015 1442   K 3.9 05/28/2024 1718   K 4.2 10/07/2015 1442   CL 105 05/28/2024 1342   CO2 21 (L) 05/28/2024 0243   CO2 23 10/07/2015 1442   GLUCOSE 209 (H) 05/28/2024 1342   GLUCOSE 93 10/07/2015 1442   BUN 12 05/28/2024 1342   BUN 22 (H) 12/03/2018 1330   BUN 20.8 10/07/2015 1442   CREATININE 1.70 (H) 05/28/2024 1342   CREATININE 1.61 (H) 09/03/2023 1358   CREATININE 2.3 (H) 10/07/2015 1442   CALCIUM  8.5 (L) 05/28/2024 0243   CALCIUM  9.0 10/07/2015 1442   EGFR 49 (L) 09/21/2022 1002   GFRNONAA 58 (L) 05/28/2024 0243   GFRNONAA 36 (L) 08/16/2020 1019     A/P:  Continue VA ECMO, vent. AHF team and CCM following.

## 2024-05-28 NOTE — Op Note (Signed)
 [address]       [name] [address]             [phone]                                          05/28/2024 Patient:  Caron GORMAN Louder Pre-Op Dx: STEMI 2V CAD CHF  HIV CRI   Post-op Dx:  same Procedure: CABG X 2.  LIMA D-1, RSVG OM   Endoscopic greater saphenous vein harvest on the right Central ECMO cannulation   Surgeon and Role:      * Kahlan Engebretson, Linnie KIDD, MD - Primary    * E. Barrett , PA-C - assisting An experienced assistant was required given the complexity of this surgery and the standard of surgical care. The assistant was needed for exposure, dissection, suctioning, retraction of delicate tissues and sutures, instrument exchange and for overall help during this procedure.    Anesthesia  general EBL:  Blood Administration: see anesthesia report Xclamp Time:  53 min   Drains: 19 F blake drain: L, 24F Blake X 2 mediastinal  Wires: ventricular Counts: correct   Indications: 43 y.o. male with 2V CAD, and STEMI.  Awaiting formal echo.  IABP being placed currently.  Will plan for surgery   Findings: Small LIMA.  Good vein.  No good LAD target.  Large proximal diagonal was a better target for the LIMA.  Small OM.  Patient was very hypotensive coming off pump with IABP and chemical support.  The heart was recannulated, and we converted to central ECMO for mechanical support.    Operative Technique: All invasive lines were placed in pre-op holding.  After the risks, benefits and alternatives were thoroughly discussed, the patient was brought to the operative theatre.  Anesthesia was induced, and the patient was prepped and draped in normal sterile fashion.  An appropriate surgical pause was performed, and pre-operative antibiotics were dosed accordingly.  We began with simultaneous incisions along the right leg for harvesting of the greater saphenous vein and the chest for the sternotomy.  In regards to the sternotomy, this was  carried down with bovie cautery, and the sternum was divided with a reciprocating saw.  Meticulous hemostasis was obtained.  The left internal thoracic artery was exposed and harvested in in pedicled fashion.  The patient was systemically heparinized, and the artery was divided distally, and placed in a papaverine sponge.    The sternal elevator was removed, and a retractor was placed.  The pericardium was divided in the midline and fashioned into a cradle with pericardial stitches.   After we confirmed an appropriate ACT, the ascending aorta was cannulated in standard fashion.  The right atrial appendage was used for venous cannulation site.  Cardiopulmonary bypass was initiated, and the heart retractor was placed. The cross clamp was applied, and a dose of anterograde cardioplegia was given with good arrest of the heart.  Next we exposed the lateral wall, and found a good target on the OM.  An end to side anastomosis with the vein graft was then created.  Finally, we exposed the anterior wall, but there was no good target on the LAD.  The diagonal was a much better target so we fashioned an end to side anastomosis between it and the LITA.  We began to re-warm, and a re-animation dose of  cardioplegia was given.  The heart was de-aired, and the cross clamp was removed.  Meticulous hemostasis was obtained.    A partial occludding clamp was then placed on the ascending aorta, and we created an end to side anastomosis between it and the proximal vein graft.  Rings were placed on the proximal anastomosis.  Hemostasis was obtained, and we separated from cardiopulmonary bypass but he became profoundly hypotensive despite chemical support, and mechanical support with the IABP.  I elected to recannulate the heart, and convert him to central ECMO.    The heparin  was reversed with protamine.  Chest tubes and wires were placed.  His chest was left open, and a sterile dressing was applied.    The patient was transferred  to the ICU in guarded condition.  Hans Rusher MALVA Rayas

## 2024-05-28 NOTE — Transfer of Care (Signed)
 Immediate Anesthesia Transfer of Care Note  Patient: Thomas [name]  Procedure(s) Performed: CORONARY ARTERY BYPASS GRAFTING (CABG) TIMES TWO, UTILIZING LEFT INTERNAL MAMMARY ARTERY AND ENDOVEIN HARVEST RIGHT GREATER SAPHENOUS VEIN (Chest) ECHOCARDIOGRAM, TRANSESOPHAGEAL CANNULATION FOR ECMO (EXTRACORPOREAL MEMBRANE OXYGENATION)  Patient Location: PACU and ICU  Anesthesia Type:General  Level of Consciousness: Patient remains intubated per anesthesia plan  Airway & Oxygen Therapy: Patient remains intubated per anesthesia plan and Patient placed on Ventilator (see vital sign flow sheet for setting)  Post-op Assessment: Report given to RN and Post -op Vital signs reviewed and stable  Post vital signs: Reviewed and stable  Last Vitals:  Vitals Value Taken Time  BP    Temp 36.4 C 05/28/24 15:21  Pulse    Resp 16 05/28/24 15:21  SpO2    Vitals shown include unfiled device data.  Last Pain:  Vitals:   05/28/24 0800  TempSrc:   PainSc: 0-No pain         Complications: No notable events documented.

## 2024-05-28 NOTE — Progress Notes (Signed)
 OT Cancellation Note  Patient Details Name: Thomas [name] MRN: [id number] DOB: [birth year]   Cancelled Treatment:    Reason Eval/Treat Not Completed: Patient not medically ready (pt with initiation of ECMO, not yet appropriate for OT evaluation, will sign off. Please reorder when medicaly ready.)  Averee Harb K, OTD, OTR/L SecureChat [facility] [phone]   Laneta MARLA Pereyra 05/28/2024, 4:40 PM

## 2024-05-28 NOTE — CV Procedure (Signed)
 ECMO NOTE:   Indication: Post-cardiotomy shock   Initial cannulation date: 05/28/24   ECMO type: Central VA ECMO   Dual lumen inflow/return cannula:   1) Central ascending 20FR Ao return 2) Central 29 FR RA triple-stage drainage    Daily data:   Flow 4.7L RPM 3500 Sweep  3L   Labs:   ABG    Component Value Date/Time   PHART 7.367 05/28/2024 1718   PCO2ART 29.9 (L) 05/28/2024 1718   PO2ART 74 (L) 05/28/2024 1718   HCO3 17.4 (L) 05/28/2024 1718   TCO2 18 (L) 05/28/2024 1718   ACIDBASEDEF 7.0 (H) 05/28/2024 1718   O2SAT 95 05/28/2024 1718    Hgb 8.5 Platelets 90k LDH 251 PTT  No AC currently Lactic acid 1.0   Plan:  Continue ECMO support No AC overnight   Toribio Fuel, MD  6:58 PM

## 2024-05-28 NOTE — Progress Notes (Signed)
  Echocardiogram 2D Echocardiogram has been performed.  Koleen KANDICE Popper, RDCS 05/28/2024, 10:40 AM

## 2024-05-28 NOTE — Progress Notes (Signed)
   Patient on VA ECMO with intermittent loss of flows.   Epi and NE titrated several times.   Given additional albumin.   ECMO tubing assessed and stable.   Labs stable with no evidence of bleeding. Good end-organ perfusion.  D/w Dr. Lucas at bedside.   CCT 40 mins   Toribio Fuel, MD  7:02 PM

## 2024-05-28 NOTE — Anesthesia Procedure Notes (Addendum)
 Central Venous Catheter Insertion Performed by: Darlyn Rush, MD, anesthesiologist Start/End11/11/2023 9:13 AM, 05/28/2024 9:28 AM Patient location: Pre-op. Preanesthetic checklist: patient identified, IV checked, site marked, risks and benefits discussed, surgical consent, monitors and equipment checked, pre-op evaluation, timeout performed and anesthesia consent Position: supine Lidocaine  1% used for infiltration and patient sedated Hand hygiene performed , maximum sterile barriers used  and Seldinger technique used Catheter size: 8.5 Fr MAC introducer Procedure performed using ultrasound to evaluate access site. Ultrasound Notes:relevant anatomy identified, ultrasound used to visualize needle entry, vessel patent under ultrasound and image(s) printed for medical record. Attempts: 1 Following insertion, line sutured, dressing applied and Biopatch. Post procedure assessment: blood return through all ports, free fluid flow and no air  Patient tolerated the procedure well with no immediate complications. Additional procedure comments: Triple lumen catheter placed through the introducer port.SABRA

## 2024-05-28 NOTE — Progress Notes (Signed)
 Advanced Heart Failure Rounding Note  Cardiologist: None  Chief Complaint: STEMI Subjective:    No further chest pain overnight. IABP augmenting well at 1:1. Echo reviewed, LVEF 30-35% with no LV thrombus, wall motion correlates with infarct.   Objective:   Weight Range: 94.8 kg Body mass index is 27.57 kg/m.   Vital Signs:   Temp:  [98.5 F (36.9 C)-98.9 F (37.2 C)] 98.8 F (37.1 C) (11/05 0727) Pulse Rate:  [77-270] 229 (11/05 0830) Resp:  [9-34] 16 (11/05 0830) BP: (93-129)/(57-101) 119/93 (11/05 0830) SpO2:  [88 %-100 %] 97 % (11/05 0830) Last BM Date :  (PTA)  Weight change: Filed Weights   05/27/24 0728  Weight: 94.8 kg    Intake/Output:   Intake/Output Summary (Last 24 hours) at 05/28/2024 1205 Last data filed at 05/28/2024 1116 Gross per 24 hour  Intake 1719.91 ml  Output 2125 ml  Net -405.09 ml      Physical Exam    GENERAL: NAD, fair appearing PULM:  Normal work of breathing, CTAB CARDIAC:  JVP: flat         Normal rate with regular rhythm. IABP at 1:1, site without bleeding.  No edema. Warm and well perfused extremities. ABDOMEN: Soft, non-tender, non-distended. NEUROLOGIC: Patient is oriented x3 with no focal or lateralizing neurologic deficits.    Telemetry   Sinus rhythm  Medications:     Scheduled Medications:  [MAR Hold] aspirin  EC  81 mg Oral Daily   [MAR Hold] atorvastatin   40 mg Oral Daily   bisacodyl   5 mg Oral Once   Chlorhexidine  Gluconate Cloth  6 each Topical Once   epinephrine   0-10 mcg/min Intravenous To OR   heparin  sodium (porcine) 2,500 Units, papaverine 30 mg in electrolyte-A (PLASMALYTE-A PH 7.4) 500 mL irrigation   Irrigation To OR   [facility] Blood Cardioplegia vial (lidocaine /magnesium /mannitol 0.26g-4g-6.4g)   Intracoronary To OR   potassium chloride   80 mEq Other To OR   [MAR Hold] sodium chloride  flush  3 mL Intravenous Q12H   tranexamic acid  2 mg/kg Intracatheter To OR    Infusions:  [MAR Hold]  sodium chloride       ceFAZolin  (ANCEF ) IV     DOBUTamine     heparin  30,000 units/NS 1000 mL solution for CELLSAVER     heparin  1,250 Units/hr (05/28/24 0800)   milrinone     nitroGLYCERIN      [MAR Hold] norepinephrine  (LEVOPHED ) Adult infusion Stopped (05/27/24 1332)   norepinephrine       PRN Medications: [MAR Hold] sodium chloride , 0.9 % irrigation (POUR BTL), heparin  sodium (porcine) 2,500 Units, papaverine 30 mg in electrolyte-A (PLASMALYTE-A PH 7.4) 500 mL irrigation, [MAR Hold] ondansetron  (ZOFRAN ) IV, [MAR Hold] sodium chloride  flush, Surgifoam 1 Gm with 0.9% sodium chloride  (4 ml) topical solution, temazepam    Patient Profile   Thomas [name] is a 43 y.o. male with a PMH of CAD s/p PCI to the OM1 in 2017, chronic angina, right nephrectomy, CKD stage IIIa, HIV, hodgkin's lymphoma treated with chemotherapy who presents with STEM.  Assessment/Plan    CAD/STEMI: Initial EKG with lateral STEMI, changes and chest pain improved in the ED but given dynamic changes taking to the cath lab. - S/p POBA to the ostial LAD - Poor stent landing zone, multivessel CAD, reduced EF - Plan for CABG - IABP support, kept a 1:1 overnight. Augmenting well - Discussed with CT surgery this morning - Will continue to follow post procedure  Acute systolic heart failure: -  Due to ischemia - Plan for CABG today, appears well compensated, LVEDP 14 prior to IABP - GDMT following stabilization  HIV:  - Continue home meds  CKD: Creatinine 1.53 this AM, near baseline - Monitor post procdure  CRITICAL CARE Performed by: Morene JINNY Brownie   Total critical care time: 32 minutes  Critical care time was exclusive of separately billable procedures and treating other patients.  Critical care was necessary to treat or prevent imminent or life-threatening deterioration.  Critical care was time spent personally by me on the following activities: development of treatment plan with patient and/or  surrogate as well as nursing, discussions with consultants, evaluation of patient's response to treatment, examination of patient, obtaining history from patient or surrogate, ordering and performing treatments and interventions, ordering and review of laboratory studies, ordering and review of radiographic studies, pulse oximetry and re-evaluation of patient's condition.    Length of Stay: 1  Morene JINNY Brownie, MD  05/28/2024, 12:05 PM  Advanced Heart Failure Team Pager [phone] (M-F; 7a - 5p)  Please contact [facility] for night-coverage after hours (5p -7a ) and weekends on [email or web address]

## 2024-05-28 NOTE — Progress Notes (Signed)
 PHARMACY - ANTICOAGULATION CONSULT NOTE  Pharmacy Consult for heparin  Indication: IABP in place  Allergies  Allergen Reactions   Tylenol  Dixon.dills ] Other (See Comments)    Cold sweats    Patient Measurements: Height: 6' 1 (185.4 cm) Weight: 94.8 kg (209 lb) IBW/kg (Calculated) : 79.9 HEPARIN  DW (KG): 94.8  Vital Signs: Temp: 98.5 F (36.9 C) (11/05 0320) Temp Source: Axillary (11/05 0320) BP: 111/78 (11/04 2300) Pulse Rate: 227 (11/04 2300)  Labs: Recent Labs    05/27/24 0740 05/27/24 0743 05/27/24 0748 05/27/24 1139 05/27/24 1747 05/28/24 0243  HGB 13.8 13.6 12.9* 14.6  --  13.4  HCT 40.7 40.0 38.0* 44.5  --  40.2  PLT 187  --   --  204  --  176  APTT 29  --   --  31  --   --   LABPROT 13.8  --   --  14.3  --   --   INR 1.0  --   --  1.0  --   --   HEPARINUNFRC  --   --   --   --  0.12* 0.15*  CREATININE 1.80* 1.70*  --  1.80*  --   --   TROPONINIHS 2,686*  --   --  3,318*  --   --     Estimated Creatinine Clearance: 61 mL/min (A) (by C-G formula based on SCr of 1.8 mg/dL (H)).  Assessment: 43yo male presented with chest pain and found to have STEMI. IABP placed, heparin  to bridge until planned CABG 11/5. Of note, patient received heparin  boluses during procedure. CBC stable.  Heparin  level subtherapeutic (0.15) on infusion at 1150 units/hr. No issues with line or bleeding reported per RN. CBC shows Hgb 14.6 > 13, plts 204 > 176  Goal of Therapy:  Heparin  level 0.2-0.5 units/ml Monitor platelets by anticoagulation protocol: Yes  Plan:  Increase heparin  infusion to 1250 units/hr F/u heparin  turn off on call to OR 11/5  Lynwood Poplar, PharmD, BCPS Clinical Pharmacist 05/28/2024 4:28 AM

## 2024-05-28 NOTE — Brief Op Note (Signed)
 05/27/2024 - 05/28/2024  7:50 AM  PATIENT:  Thomas [name]  43 y.o. male  PRE-OPERATIVE DIAGNOSIS:  CORONARY ARTERY DISEASE  POST-OPERATIVE DIAGNOSIS:  CORONARY ARTERY DISEASE  PROCEDURE:  Procedure(s):  CORONARY ARTERY BYPASS GRAFTING x 2 -LIMA to Diagonal -SVG to OM  ECHOCARDIOGRAM, TRANSESOPHAGEAL (N/A)  ENDOSCOPIC HARVEST GREATER SAPHENOUS VEIN -Right leg Vein harvest time: 25 min Vein prep time: 15 min  CANNULATION FOR ECMO  SURGEON:  Surgeons and Role:    * Shyrl Linnie KIDD, MD - Primary  PHYSICIAN ASSISTANT: Rocky Shad PA-C  ASSISTANTS: Evalene Domino RNFA   ANESTHESIA:   general  EBL:  Per Anesthesia Record  BLOOD ADMINISTERED:  CC CELLSAVER  DRAINS: Left Pleural Chest Tube, Mediastinal Chest Drain   LOCAL MEDICATIONS USED:  NONE  SPECIMEN:  No Specimen  DISPOSITION OF SPECIMEN:  N/A  COUNTS:  YES  TOURNIQUET:  * No tourniquets in log *  DICTATION: .Dragon Dictation  PLAN OF CARE: Admit to inpatient   PATIENT DISPOSITION:  ICU - intubated and hemodynamically stable.   Delay start of Pharmacological VTE agent (>24hrs) due to surgical blood loss or risk of bleeding: yes

## 2024-05-28 NOTE — Anesthesia Procedure Notes (Signed)
 Procedure Name: Intubation Date/Time: 05/28/2024 9:09 AM  Performed by: Delores Dus, CRNAPre-anesthesia Checklist: Patient identified, Emergency Drugs available, Suction available and Patient being monitored Patient Re-evaluated:Patient Re-evaluated prior to induction Oxygen Delivery Method: Circle system utilized Preoxygenation: Pre-oxygenation with 100% oxygen Induction Type: IV induction Ventilation: Mask ventilation without difficulty Laryngoscope Size: Miller and 2 Grade View: Grade I Tube type: Oral Tube size: 8.0 mm Number of attempts: 1 Airway Equipment and Method: Stylet and Oral airway Placement Confirmation: ETT inserted through vocal cords under direct vision, positive ETCO2 and breath sounds checked- equal and bilateral Secured at: 23 cm Tube secured with: Tape Dental Injury: Teeth and Oropharynx as per pre-operative assessment

## 2024-05-28 NOTE — Anesthesia Preprocedure Evaluation (Signed)
 Anesthesia Evaluation  Patient identified by MRN, date of birth, ID band Patient awake    Reviewed: Allergy & Precautions, NPO status , Patient's Chart, lab work & pertinent test results  History of Anesthesia Complications Negative for: history of anesthetic complications  Airway Mallampati: III  TM Distance: >3 FB Neck ROM: Full    Dental  (+) Dental Advisory Given   Pulmonary former smoker   breath sounds clear to auscultation       Cardiovascular + angina  + Past MI and +CHF   Rhythm:Regular     Neuro/Psych    GI/Hepatic   Endo/Other    Renal/GU Renal disease     Musculoskeletal   Abdominal   Peds  Hematology  (+) Blood dyscrasia, anemia   Anesthesia Other Findings   Reproductive/Obstetrics                              Anesthesia Physical Anesthesia Plan  ASA: 4  Anesthesia Plan: General   Post-op Pain Management:    Induction: Intravenous  PONV Risk Score and Plan: 2 and Ondansetron   Airway Management Planned: Oral ETT  Additional Equipment: Arterial line, CVP and TEE  Intra-op Plan:   Post-operative Plan: Post-operative intubation/ventilation  Informed Consent: I have reviewed the patients History and Physical, chart, labs and discussed the procedure including the risks, benefits and alternatives for the proposed anesthesia with the patient or authorized representative who has indicated his/her understanding and acceptance.     Dental advisory given  Plan Discussed with: CRNA  Anesthesia Plan Comments:          Anesthesia Quick Evaluation

## 2024-05-28 NOTE — Anesthesia Procedure Notes (Signed)
 Arterial Line Insertion Start/End11/11/2023 9:16 AM, 05/28/2024 9:31 AM Performed by: Darlyn Rush, MD, anesthesiologist  Patient location: Pre-op. Preanesthetic checklist: patient identified, IV checked, site marked, risks and benefits discussed, surgical consent, monitors and equipment checked, pre-op evaluation, timeout performed and anesthesia consent Lidocaine  1% used for infiltration Left, radial was placed Catheter size: 20 G Hand hygiene performed , maximum sterile barriers used  and Seldinger technique used  Attempts: 3 Procedure performed using ultrasound to evaluate access site. Ultrasound Notes:relevant anatomy identified, ultrasound used to visualize needle entry, vessel patent under ultrasound and image(s) printed for medical record. Following insertion, dressing applied and Biopatch. Post procedure assessment: normal and unchanged  Post procedure complications: second provider assisted and unsuccessful attempts. Patient tolerated the procedure well with no immediate complications.

## 2024-05-28 NOTE — CV Procedure (Signed)
   IABP removal note   Sutures and dressing removed.   ACT 124s  IABP placed in standby.   Balloon pulled back to sheath. Balloon and sheath pulled. Manual pressure held x 30 mins with good hemostasis. Dressing placed. Fem stop placed.   R PT pulse intact.   Toribio Fuel, MD  7:04 PM

## 2024-05-28 NOTE — Consult Note (Incomplete)
 NAME:  Thomas [name], MRN:  [id number], DOB:  [birth year], LOS: 1 ADMISSION DATE:  05/27/2024, CONSULTATION DATE:  *** REFERRING MD:  ***, CHIEF COMPLAINT:  ***   History of Present Illness:  ***  Pertinent  Medical History  ***  Significant Hospital Events: Including procedures, antibiotic start and stop dates in addition to other pertinent events     Interim History / Subjective:  ***  Objective   Blood pressure (!) 113/93, pulse (!) 182, temperature 98.8 F (37.1 C), temperature source Oral, resp. rate 19, height 6' 1 (1.854 m), weight 94.8 kg, SpO2 98%.        Intake/Output Summary (Last 24 hours) at 05/28/2024 0926 Last data filed at 05/28/2024 0800 Gross per 24 hour  Intake 1186.96 ml  Output 1975 ml  Net -788.04 ml   Filed Weights   05/27/24 0728  Weight: 94.8 kg    Examination: General: *** HENT: *** Lungs: *** Cardiovascular: *** Abdomen: *** Extremities: *** Neuro: *** GU: ***  {Icu pulm iv lines:31576::A-line, Day# ***,PIC line, Day# ***,Central line, Day# ***,Norva Purl, Day# ***,IV, Day# ***} Resolved Hospital Problem list   ***  Assessment & Plan:  ***   Labs   CBC: Recent Labs  Lab 05/27/24 0740 05/27/24 0743 05/27/24 0748 05/27/24 1139 05/28/24 0243  WBC 5.2  --   --  5.6 6.2  NEUTROABS 2.7  --   --  3.1  --   HGB 13.8 13.6 12.9* 14.6 13.4  HCT 40.7 40.0 38.0* 44.5 40.2  MCV 98.5  --   --  100.7* 100.0  PLT 187  --   --  204 176    Basic Metabolic Panel: Recent Labs  Lab 05/27/24 0740 05/27/24 0743 05/27/24 0748 05/27/24 1139 05/28/24 0243  NA 140 143 142 142 139  K 3.2* 3.4* 3.3* 3.7 3.6  CL 107 109  --  107 107  CO2 21*  --   --  23 21*  GLUCOSE 104* 102*  --  123* 95  BUN 18 19  --  19 13  CREATININE 1.80* 1.70*  --  1.80* 1.53*  CALCIUM  8.3*  --   --  8.6* 8.5*   GFR: Estimated Creatinine Clearance: 71.8 mL/min (A) (by C-G formula based on SCr of 1.53 mg/dL (H)). Recent Labs  Lab 05/27/24 0740  05/27/24 0743 05/27/24 0840 05/27/24 1139 05/28/24 0243  WBC 5.2  --   --  5.6 6.2  LATICACIDVEN  --  0.8 0.6 1.4  --     Liver Function Tests: Recent Labs  Lab 05/27/24 0740 05/27/24 1139  AST 28 29  ALT 22 22  ALKPHOS 43 45  BILITOT 0.6 0.7  PROT 6.2* 6.4*  ALBUMIN 2.7* 3.0*   No results for input(s): LIPASE, AMYLASE in the last 168 hours. No results for input(s): AMMONIA in the last 168 hours.  ABG    Component Value Date/Time   PHART 7.380 05/27/2024 0748   PCO2ART 36.3 05/27/2024 0748   PO2ART 103 05/27/2024 0748   HCO3 21.5 05/27/2024 0748   TCO2 23 05/27/2024 0748   ACIDBASEDEF 3.0 (H) 05/27/2024 0748   O2SAT 98 05/27/2024 0748     Coagulation Profile: Recent Labs  Lab 05/27/24 0740 05/27/24 1139  INR 1.0 1.0    Cardiac Enzymes: No results for input(s): CKTOTAL, CKMB, CKMBINDEX, TROPONINI in the last 168 hours.  HbA1C: Hgb A1c MFr Bld  Date/Time Value Ref Range Status  05/27/2024 11:39 AM 5.2 4.8 - 5.6 %  Final    Comment:    (NOTE) Diagnosis of Diabetes The following HbA1c ranges recommended by the American Diabetes Association (ADA) may be used as an aid in the diagnosis of diabetes mellitus.  Hemoglobin             Suggested A1C NGSP%              Diagnosis  <5.7                   Non Diabetic  5.7-6.4                Pre-Diabetic  >6.4                   Diabetic  <7.0                   Glycemic control for                       adults with diabetes.    05/27/2024 07:40 AM 5.2 4.8 - 5.6 % Final    Comment:    (NOTE) Diagnosis of Diabetes The following HbA1c ranges recommended by the American Diabetes Association (ADA) may be used as an aid in the diagnosis of diabetes mellitus.  Hemoglobin             Suggested A1C NGSP%              Diagnosis  <5.7                   Non Diabetic  5.7-6.4                Pre-Diabetic  >6.4                   Diabetic  <7.0                   Glycemic control for                        adults with diabetes.      CBG: No results for input(s): GLUCAP in the last 168 hours.  Review of Systems:   ***  Past Medical History:  He,  has a past medical history of Acute respiratory failure with hypoxemia: post cardiac arrest (05/19/2014), Cancer (HCC), Cardiac arrest (HCC) (05/15/2014), Chronic renal insufficiency, CKD (chronic kidney disease) s/p Right nephrectomy (04/30/2014) (05/19/2014), CLABSI (central line-associated bloodstream infection) (02/27/2014), Enteritis (01/22/2014), History of blood transfusion (july 2015 per pt), HIV disease (HCC) (06/25/2012), Hydronephrosis, right & hydroureter: secondary to obstructing ureteral calculi (03/01/2014), Leg pain (07/03/2012), Nephrolithiasis, NSTEMI (non-ST elevated myocardial infarction) (HCC) (05/19/2014), Obstructive uropathy (12/12/2014), Penile cyst (08/06/2012), Septic shock (HCC) (01/18/2014), Shingles rash (02/25/2014), Sinus tachycardia (08/06/2012), Skin lesion (07/03/2012), Staphylococcus aureus bacteremia (02/25/2014), and Transaminitis (05/19/2014).   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  this after several previous I and Ds of rectal abcess.    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   CARDIAC CATHETERIZATION  05/19/2014   CARDIAC CATHETERIZATION N/A 10/28/2015   Procedure: Left Heart Cath and Coronary Angiography;  Surgeon: Gordy Bergamo, MD;  Location: [facility] INVASIVE CV LAB;  Service: Cardiovascular;  Laterality: N/A;   CORONARY STENT PLACEMENT  05/19/2014   OMI   & PROXIMAL CIRCUMFLEX   CORONARY/GRAFT ACUTE MI  REVASCULARIZATION N/A 05/27/2024   Procedure: Coronary/Graft Acute MI Revascularization;  Surgeon: Elmira Newman PARAS, MD;  Location: [facility] INVASIVE CV LAB;  Service: Cardiovascular;  Laterality: N/A;   CYSTOSCOPY WITH RETROGRADE PYELOGRAM, URETEROSCOPY AND STENT PLACEMENT Left 12/11/2014   Procedure:  CYSTOSCOPY WITH RETROGRADE PYELOGRAM, URETEROSCOPY, STONE REMOVAL, AND STENT PLACEMENT;  Surgeon: Mark Ottelin, MD;  Location: [facility] ORS;  Service: Urology;  Laterality: Left;   HOLMIUM LASER APPLICATION Left 12/11/2014   Procedure: HOLMIUM LASER APPLICATION;  Surgeon: Mark Ottelin, MD;  Location: [facility] ORS;  Service: Urology;  Laterality: Left;   IABP INSERTION N/A 05/27/2024   Procedure: IABP Insertion;  Surgeon: Elmira Newman PARAS, MD;  Location: [facility] INVASIVE CV LAB;  Service: Cardiovascular;  Laterality: N/A;   LAPAROSCOPIC NEPHRECTOMY Right 04/30/2014   Procedure: LAPAROSCOPIC NEPHRECTOMY AND URETERECTOMY;  Surgeon: Gretel Ferrara, MD;  Location: [facility] ORS;  Service: Urology;  Laterality: Right;   LEFT HEART CATH AND CORONARY ANGIOGRAPHY N/A 05/27/2024   Procedure: LEFT HEART CATH AND CORONARY ANGIOGRAPHY;  Surgeon: Elmira Newman PARAS, MD;  Location: [facility] INVASIVE CV LAB;  Service: Cardiovascular;  Laterality: N/A;   LEFT HEART CATHETERIZATION WITH CORONARY ANGIOGRAM N/A 05/18/2014   Procedure: LEFT HEART CATHETERIZATION WITH CORONARY ANGIOGRAM;  Surgeon: Erick JONELLE Bergamo, MD;  Location: [facility] CATH LAB;  Service: Cardiovascular;  Laterality: N/A;   pac placed and later removed     PERCUTANEOUS CORONARY STENT INTERVENTION (PCI-S) N/A 05/19/2014   Procedure: PERCUTANEOUS CORONARY STENT INTERVENTION (PCI-S);  Surgeon: Erick JONELLE Bergamo, MD;  Location: [facility] CATH LAB;  Service: Cardiovascular;  Laterality: N/A;   right nephrostomy tube  for last month and 1/2   SUPRACLAVICAL NODE BIOPSY Right 08/08/2013   Procedure: SUPRACLAVICULAR LYMPH NODE BIOPSY;  Surgeon: Dorise MARLA Fellers, MD;  Location: [facility] OR;  Service: Thoracic;  Laterality: Right;   TEE WITHOUT CARDIOVERSION N/A 02/27/2014   Procedure: TRANSESOPHAGEAL ECHOCARDIOGRAM (TEE);  Surgeon: Vinie KYM Maxcy, MD;  Location: [facility] ENDOSCOPY;  Service: Cardiovascular;  Laterality: N/A;     Social History:   reports that he has quit smoking. His smoking use included  cigarettes. He has never used smokeless tobacco. He reports that he does not drink alcohol and does not use drugs.   Family History:  His family history includes Diabetes in his mother.   Allergies Allergies  Allergen Reactions   Tylenol  [Acetaminophen ] Other (See Comments)    Cold sweats     Home Medications  Prior to Admission medications   Medication Sig Start Date End Date Taking? Authorizing Provider  ASPIR-LOW 81 MG EC tablet TAKE ONE TABLET BY MOUTH DAILY Patient taking differently: Take 162 mg by mouth daily. 02/18/21  Yes Bergamo Heinz, MD  cabotegravir  & rilpivirine  ER (CABENUVA ) 600 & 900 MG/3ML injection Inject 1 kit into the muscle every 2 (two) months.   Yes [provider]     Critical care time: ***

## 2024-05-29 ENCOUNTER — Inpatient Hospital Stay (HOSPITAL_COMMUNITY)

## 2024-05-29 ENCOUNTER — Inpatient Hospital Stay (HOSPITAL_COMMUNITY): Admitting: Certified Registered Nurse Anesthetist

## 2024-05-29 ENCOUNTER — Encounter (HOSPITAL_COMMUNITY): Payer: Self-pay | Admitting: Thoracic Surgery (Cardiothoracic Vascular Surgery)

## 2024-05-29 ENCOUNTER — Encounter (HOSPITAL_COMMUNITY)
Admission: EM | Disposition: A | Discharge status: Home Health | Payer: Self-pay | Source: Home / Self Care | Attending: Thoracic Surgery (Cardiothoracic Vascular Surgery)

## 2024-05-29 DIAGNOSIS — D696 Thrombocytopenia, unspecified: Secondary | ICD-10-CM

## 2024-05-29 DIAGNOSIS — N179 Acute kidney failure, unspecified: Secondary | ICD-10-CM | POA: Diagnosis not present

## 2024-05-29 DIAGNOSIS — R570 Cardiogenic shock: Secondary | ICD-10-CM

## 2024-05-29 DIAGNOSIS — R739 Hyperglycemia, unspecified: Secondary | ICD-10-CM | POA: Diagnosis not present

## 2024-05-29 DIAGNOSIS — I5021 Acute systolic (congestive) heart failure: Secondary | ICD-10-CM | POA: Diagnosis not present

## 2024-05-29 DIAGNOSIS — N189 Chronic kidney disease, unspecified: Secondary | ICD-10-CM

## 2024-05-29 DIAGNOSIS — Z9281 Personal history of extracorporeal membrane oxygenation (ECMO): Secondary | ICD-10-CM

## 2024-05-29 DIAGNOSIS — Z9911 Dependence on respirator [ventilator] status: Secondary | ICD-10-CM

## 2024-05-29 DIAGNOSIS — D62 Acute posthemorrhagic anemia: Secondary | ICD-10-CM

## 2024-05-29 DIAGNOSIS — I2102 ST elevation (STEMI) myocardial infarction involving left anterior descending coronary artery: Secondary | ICD-10-CM | POA: Diagnosis not present

## 2024-05-29 DIAGNOSIS — T17998A Other foreign object in respiratory tract, part unspecified causing other injury, initial encounter: Secondary | ICD-10-CM

## 2024-05-29 DIAGNOSIS — Z515 Encounter for palliative care: Secondary | ICD-10-CM

## 2024-05-29 DIAGNOSIS — N1831 Chronic kidney disease, stage 3a: Secondary | ICD-10-CM | POA: Diagnosis not present

## 2024-05-29 DIAGNOSIS — Z7189 Other specified counseling: Secondary | ICD-10-CM

## 2024-05-29 HISTORY — PX: PLACEMENT OF IMPELLA LEFT VENTRICULAR ASSIST DEVICE: SHX6519

## 2024-05-29 LAB — POCT I-STAT 7, (LYTES, BLD GAS, ICA,H+H)
Acid-base deficit: 6 mmol/L — ABNORMAL HIGH (ref 0.0–2.0)
Acid-base deficit: 3 mmol/L — ABNORMAL HIGH (ref 0.0–2.0)
Acid-base deficit: 7 mmol/L — ABNORMAL HIGH (ref 0.0–2.0)
Acid-base deficit: 7 mmol/L — ABNORMAL HIGH (ref 0.0–2.0)
Acid-base deficit: 6 mmol/L — ABNORMAL HIGH (ref 0.0–2.0)
Bicarbonate: 18.1 mmol/L — ABNORMAL LOW (ref 20.0–28.0)
Bicarbonate: 21.6 mmol/L (ref 20.0–28.0)
Bicarbonate: 19 mmol/L — ABNORMAL LOW (ref 20.0–28.0)
Bicarbonate: 18.8 mmol/L — ABNORMAL LOW (ref 20.0–28.0)
Bicarbonate: 19 mmol/L — ABNORMAL LOW (ref 20.0–28.0)
Calcium, Ion: 1.07 mmol/L — ABNORMAL LOW (ref 1.15–1.40)
Calcium, Ion: 1.12 mmol/L — ABNORMAL LOW (ref 1.15–1.40)
Calcium, Ion: 1.1 mmol/L — ABNORMAL LOW (ref 1.15–1.40)
Calcium, Ion: 1.07 mmol/L — ABNORMAL LOW (ref 1.15–1.40)
Calcium, Ion: 1.1 mmol/L — ABNORMAL LOW (ref 1.15–1.40)
HCT: 21 % — ABNORMAL LOW (ref 39.0–52.0)
HCT: 20 % — ABNORMAL LOW (ref 39.0–52.0)
HCT: 23 % — ABNORMAL LOW (ref 39.0–52.0)
HCT: 19 % — ABNORMAL LOW (ref 39.0–52.0)
HCT: 20 % — ABNORMAL LOW (ref 39.0–52.0)
Hemoglobin: 7.1 g/dL — ABNORMAL LOW (ref 13.0–17.0)
Hemoglobin: 6.8 g/dL — CL (ref 13.0–17.0)
Hemoglobin: 7.8 g/dL — ABNORMAL LOW (ref 13.0–17.0)
Hemoglobin: 6.5 g/dL — CL (ref 13.0–17.0)
Hemoglobin: 6.8 g/dL — CL (ref 13.0–17.0)
O2 Saturation: 94 %
O2 Saturation: 95 %
O2 Saturation: 90 %
O2 Saturation: 94 %
O2 Saturation: 98 %
Patient temperature: 36.2
Patient temperature: 36.4
Patient temperature: 35.9
Patient temperature: 36.3
Patient temperature: 36.4
Potassium: 3.2 mmol/L — ABNORMAL LOW (ref 3.5–5.1)
Potassium: 4.1 mmol/L (ref 3.5–5.1)
Potassium: 3.6 mmol/L (ref 3.5–5.1)
Potassium: 4 mmol/L (ref 3.5–5.1)
Potassium: 4.1 mmol/L (ref 3.5–5.1)
Sodium: 146 mmol/L — ABNORMAL HIGH (ref 135–145)
Sodium: 143 mmol/L (ref 135–145)
Sodium: 145 mmol/L (ref 135–145)
Sodium: 143 mmol/L (ref 135–145)
Sodium: 142 mmol/L (ref 135–145)
TCO2: 19 mmol/L — ABNORMAL LOW (ref 22–32)
TCO2: 23 mmol/L (ref 22–32)
TCO2: 20 mmol/L — ABNORMAL LOW (ref 22–32)
TCO2: 20 mmol/L — ABNORMAL LOW (ref 22–32)
TCO2: 20 mmol/L — ABNORMAL LOW (ref 22–32)
pCO2 arterial: 30 mmHg — ABNORMAL LOW (ref 32–48)
pCO2 arterial: 36.3 mmHg (ref 32–48)
pCO2 arterial: 36.1 mmHg (ref 32–48)
pCO2 arterial: 34.9 mmHg (ref 32–48)
pCO2 arterial: 34.5 mmHg (ref 32–48)
pH, Arterial: 7.385 (ref 7.35–7.45)
pH, Arterial: 7.379 (ref 7.35–7.45)
pH, Arterial: 7.324 — ABNORMAL LOW (ref 7.35–7.45)
pH, Arterial: 7.335 — ABNORMAL LOW (ref 7.35–7.45)
pH, Arterial: 7.347 — ABNORMAL LOW (ref 7.35–7.45)
pO2, Arterial: 69 mmHg — ABNORMAL LOW (ref 83–108)
pO2, Arterial: 73 mmHg — ABNORMAL LOW (ref 83–108)
pO2, Arterial: 59 mmHg — ABNORMAL LOW (ref 83–108)
pO2, Arterial: 71 mmHg — ABNORMAL LOW (ref 83–108)
pO2, Arterial: 103 mmHg (ref 83–108)

## 2024-05-29 LAB — CBC
HCT: 24.7 % — ABNORMAL LOW (ref 39.0–52.0)
HCT: 24.1 % — ABNORMAL LOW (ref 39.0–52.0)
HCT: 26.2 % — ABNORMAL LOW (ref 39.0–52.0)
Hemoglobin: 8.7 g/dL — ABNORMAL LOW (ref 13.0–17.0)
Hemoglobin: 8.1 g/dL — ABNORMAL LOW (ref 13.0–17.0)
Hemoglobin: 8.9 g/dL — ABNORMAL LOW (ref 13.0–17.0)
MCH: 33.1 pg (ref 26.0–34.0)
MCH: 32 pg (ref 26.0–34.0)
MCH: 33 pg (ref 26.0–34.0)
MCHC: 35.2 g/dL (ref 30.0–36.0)
MCHC: 33.6 g/dL (ref 30.0–36.0)
MCHC: 34 g/dL (ref 30.0–36.0)
MCV: 93.9 fL (ref 80.0–100.0)
MCV: 95.3 fL (ref 80.0–100.0)
MCV: 97 fL (ref 80.0–100.0)
Platelets: 75 K/uL — ABNORMAL LOW (ref 150–400)
Platelets: 68 K/uL — ABNORMAL LOW (ref 150–400)
Platelets: 60 K/uL — ABNORMAL LOW (ref 150–400)
RBC: 2.63 MIL/uL — ABNORMAL LOW (ref 4.22–5.81)
RBC: 2.53 MIL/uL — ABNORMAL LOW (ref 4.22–5.81)
RBC: 2.7 MIL/uL — ABNORMAL LOW (ref 4.22–5.81)
RDW: 15.3 % (ref 11.5–15.5)
RDW: 15.4 % (ref 11.5–15.5)
RDW: 15.3 % (ref 11.5–15.5)
WBC: 4.4 K/uL (ref 4.0–10.5)
WBC: 4.4 K/uL (ref 4.0–10.5)
WBC: 7.2 K/uL (ref 4.0–10.5)
nRBC: 0 % (ref 0.0–0.2)
nRBC: 0 % (ref 0.0–0.2)
nRBC: 0 % (ref 0.0–0.2)

## 2024-05-29 LAB — POCT I-STAT, CHEM 8
BUN: 8 mg/dL (ref 6–20)
Calcium, Ion: 1.05 mmol/L — ABNORMAL LOW (ref 1.15–1.40)
Chloride: 111 mmol/L (ref 98–111)
Creatinine, Ser: 1.2 mg/dL (ref 0.61–1.24)
Glucose, Bld: 94 mg/dL (ref 70–99)
HCT: 31 % — ABNORMAL LOW (ref 39.0–52.0)
Hemoglobin: 10.5 g/dL — ABNORMAL LOW (ref 13.0–17.0)
Potassium: 3.6 mmol/L (ref 3.5–5.1)
Sodium: 144 mmol/L (ref 135–145)
TCO2: 22 mmol/L (ref 22–32)

## 2024-05-29 LAB — POCT I-STAT EG7
Acid-base deficit: 6 mmol/L — ABNORMAL HIGH (ref 0.0–2.0)
Bicarbonate: 20.6 mmol/L (ref 20.0–28.0)
Calcium, Ion: 1.14 mmol/L — ABNORMAL LOW (ref 1.15–1.40)
HCT: 23 % — ABNORMAL LOW (ref 39.0–52.0)
Hemoglobin: 7.8 g/dL — ABNORMAL LOW (ref 13.0–17.0)
O2 Saturation: 79 %
Patient temperature: 36.3
Potassium: 4.1 mmol/L (ref 3.5–5.1)
Sodium: 143 mmol/L (ref 135–145)
TCO2: 22 mmol/L (ref 22–32)
pCO2, Ven: 42.4 mmHg — ABNORMAL LOW (ref 44–60)
pH, Ven: 7.29 (ref 7.25–7.43)
pO2, Ven: 47 mmHg — ABNORMAL HIGH (ref 32–45)

## 2024-05-29 LAB — HEPATIC FUNCTION PANEL
ALT: 9 U/L (ref 0–44)
AST: 25 U/L (ref 15–41)
Albumin: 3.1 g/dL — ABNORMAL LOW (ref 3.5–5.0)
Alkaline Phosphatase: 17 U/L — ABNORMAL LOW (ref 38–126)
Bilirubin, Direct: 0.3 mg/dL — ABNORMAL HIGH (ref 0.0–0.2)
Indirect Bilirubin: 0.5 mg/dL (ref 0.3–0.9)
Total Bilirubin: 0.8 mg/dL (ref 0.0–1.2)
Total Protein: 4.5 g/dL — ABNORMAL LOW (ref 6.5–8.1)

## 2024-05-29 LAB — COMPREHENSIVE METABOLIC PANEL WITH GFR
ALT: 9 U/L (ref 0–44)
AST: 21 U/L (ref 15–41)
Albumin: 3.1 g/dL — ABNORMAL LOW (ref 3.5–5.0)
Alkaline Phosphatase: 18 U/L — ABNORMAL LOW (ref 38–126)
Anion gap: 10 (ref 5–15)
BUN: 7 mg/dL (ref 6–20)
CO2: 21 mmol/L — ABNORMAL LOW (ref 22–32)
Calcium: 7.3 mg/dL — ABNORMAL LOW (ref 8.9–10.3)
Chloride: 110 mmol/L (ref 98–111)
Creatinine, Ser: 1.53 mg/dL — ABNORMAL HIGH (ref 0.61–1.24)
GFR, Estimated: 58 mL/min — ABNORMAL LOW (ref >60)
Glucose, Bld: 121 mg/dL — ABNORMAL HIGH (ref 70–99)
Potassium: 4 mmol/L (ref 3.5–5.1)
Sodium: 141 mmol/L (ref 135–145)
Total Bilirubin: 1.1 mg/dL (ref 0.0–1.2)
Total Protein: 4.8 g/dL — ABNORMAL LOW (ref 6.5–8.1)

## 2024-05-29 LAB — GLOBAL TEG PANEL
CFF Max Amplitude: 19.5 mm (ref 15–32)
CK with Heparinase (R): 7.5 min (ref 4.3–8.3)
Citrated Functional Fibrinogen: 355.8 mg/dL (ref 278–581)
Citrated Kaolin (K): 2.1 min (ref 0.8–2.1)
Citrated Kaolin (MA): 51 mm — ABNORMAL LOW (ref 52–69)
Citrated Kaolin (R): 10 min — ABNORMAL HIGH (ref 4.6–9.1)
Citrated Kaolin Angle: 67.5 deg (ref 63–78)
Citrated Rapid TEG (MA): 54.8 mm (ref 52–70)

## 2024-05-29 LAB — GLUCOSE, CAPILLARY
Glucose-Capillary: 149 mg/dL — ABNORMAL HIGH (ref 70–99)
Glucose-Capillary: 150 mg/dL — ABNORMAL HIGH (ref 70–99)
Glucose-Capillary: 133 mg/dL — ABNORMAL HIGH (ref 70–99)
Glucose-Capillary: 132 mg/dL — ABNORMAL HIGH (ref 70–99)
Glucose-Capillary: 119 mg/dL — ABNORMAL HIGH (ref 70–99)
Glucose-Capillary: 131 mg/dL — ABNORMAL HIGH (ref 70–99)
Glucose-Capillary: 105 mg/dL — ABNORMAL HIGH (ref 70–99)
Glucose-Capillary: 98 mg/dL (ref 70–99)
Glucose-Capillary: 98 mg/dL (ref 70–99)
Glucose-Capillary: 114 mg/dL — ABNORMAL HIGH (ref 70–99)
Glucose-Capillary: 103 mg/dL — ABNORMAL HIGH (ref 70–99)
Glucose-Capillary: 113 mg/dL — ABNORMAL HIGH (ref 70–99)
Glucose-Capillary: 118 mg/dL — ABNORMAL HIGH (ref 70–99)
Glucose-Capillary: 134 mg/dL — ABNORMAL HIGH (ref 70–99)
Glucose-Capillary: 123 mg/dL — ABNORMAL HIGH (ref 70–99)

## 2024-05-29 LAB — CG4 I-STAT (LACTIC ACID): Lactic Acid, Venous: 0.7 mmol/L (ref 0.5–1.9)

## 2024-05-29 LAB — PREPARE RBC (CROSSMATCH)

## 2024-05-29 LAB — BASIC METABOLIC PANEL WITH GFR
Anion gap: 10 (ref 5–15)
Anion gap: 7 (ref 5–15)
BUN: 11 mg/dL (ref 6–20)
BUN: 9 mg/dL (ref 6–20)
CO2: 21 mmol/L — ABNORMAL LOW (ref 22–32)
CO2: 22 mmol/L (ref 22–32)
Calcium: 7.3 mg/dL — ABNORMAL LOW (ref 8.9–10.3)
Calcium: 7.2 mg/dL — ABNORMAL LOW (ref 8.9–10.3)
Chloride: 109 mmol/L (ref 98–111)
Chloride: 111 mmol/L (ref 98–111)
Creatinine, Ser: 2.18 mg/dL — ABNORMAL HIGH (ref 0.61–1.24)
Creatinine, Ser: 1.51 mg/dL — ABNORMAL HIGH (ref 0.61–1.24)
GFR, Estimated: 38 mL/min — ABNORMAL LOW (ref >60)
GFR, Estimated: 59 mL/min — ABNORMAL LOW (ref >60)
Glucose, Bld: 130 mg/dL — ABNORMAL HIGH (ref 70–99)
Glucose, Bld: 105 mg/dL — ABNORMAL HIGH (ref 70–99)
Potassium: 3.5 mmol/L (ref 3.5–5.1)
Potassium: 4.3 mmol/L (ref 3.5–5.1)
Sodium: 140 mmol/L (ref 135–145)
Sodium: 140 mmol/L (ref 135–145)

## 2024-05-29 LAB — COOXEMETRY PANEL
Carboxyhemoglobin: 1.4 % (ref 0.5–1.5)
Methemoglobin: <0.7 % (ref 0.0–1.5)
O2 Saturation: 79.9 %
Total hemoglobin: 8.9 g/dL — ABNORMAL LOW (ref 12.0–16.0)

## 2024-05-29 LAB — MAGNESIUM
Magnesium: 2.5 mg/dL — ABNORMAL HIGH (ref 1.7–2.4)
Magnesium: 2.1 mg/dL (ref 1.7–2.4)
Magnesium: 2 mg/dL (ref 1.7–2.4)

## 2024-05-29 LAB — PROTIME-INR
INR: 1.4 — ABNORMAL HIGH (ref 0.8–1.2)
Prothrombin Time: 17.8 s — ABNORMAL HIGH (ref 11.4–15.2)

## 2024-05-29 LAB — LACTIC ACID, PLASMA: Lactic Acid, Venous: 0.8 mmol/L (ref 0.5–1.9)

## 2024-05-29 LAB — ECHO INTRAOPERATIVE TEE
Height: 73 in
Weight: 3414.48 [oz_av]

## 2024-05-29 LAB — VANCOMYCIN, RANDOM: Vancomycin Rm: 13 ug/mL

## 2024-05-29 LAB — POCT ACTIVATED CLOTTING TIME: Activated Clotting Time: 395 s

## 2024-05-29 LAB — LACTATE DEHYDROGENASE
LDH: 220 U/L — ABNORMAL HIGH (ref 98–192)
LDH: 207 U/L — ABNORMAL HIGH (ref 98–192)

## 2024-05-29 LAB — APTT: aPTT: 39 s — ABNORMAL HIGH (ref 24–36)

## 2024-05-29 LAB — FIBRINOGEN
Fibrinogen: 299 mg/dL (ref 210–475)
Fibrinogen: 469 mg/dL (ref 210–475)

## 2024-05-29 LAB — LIPOPROTEIN A (LPA): Lipoprotein (a): 253.7 nmol/L — ABNORMAL HIGH (ref <75.0)

## 2024-05-29 LAB — TRIGLYCERIDES: Triglycerides: 125 mg/dL (ref <150)

## 2024-05-29 SURGERY — INSERTION, CARDIAC ASSIST DEVICE, IMPELLA
Anesthesia: General | Site: Axilla | Laterality: Right

## 2024-05-29 MED ORDER — 0.9 % SODIUM CHLORIDE (POUR BTL) OPTIME
TOPICAL | Status: DC | PRN
  Administered 2024-05-29: 3000 mL

## 2024-05-29 MED ORDER — SODIUM CHLORIDE 0.9% FLUSH: 10.0000 mL | INTRAVENOUS | Status: DC | PRN

## 2024-05-29 MED ORDER — PROTAMINE SULFATE 10 MG/ML IV SOLN
INTRAVENOUS | Status: AC
Start: 2024-05-29 — End: 2024-05-29
  Filled 2024-05-29: qty 25

## 2024-05-29 MED ORDER — POTASSIUM CHLORIDE 10 MEQ/50ML IV SOLN
10.0000 meq | INTRAVENOUS | Status: AC
  Administered 2024-05-29 (×3): 10 meq via INTRAVENOUS
  Filled 2024-05-29 (×3): qty 50

## 2024-05-29 MED ORDER — CHLORHEXIDINE GLUCONATE CLOTH 2 % EX PADS
6.0000 | MEDICATED_PAD | Freq: Every day | CUTANEOUS | Status: DC
  Administered 2024-05-30 – 2024-06-02 (×4): 6 via TOPICAL

## 2024-05-29 MED ORDER — HEPARIN SODIUM (PORCINE) 1000 UNIT/ML IJ SOLN
INTRAMUSCULAR | Status: AC
  Filled 2024-05-29: qty 1

## 2024-05-29 MED ORDER — SODIUM CHLORIDE 0.9 % IV SOLN
2.0000 g | INTRAVENOUS | Status: DC
  Administered 2024-05-29 – 2024-05-30 (×2): 2 g via INTRAVENOUS
  Filled 2024-05-29 (×3): qty 20

## 2024-05-29 MED ORDER — VANCOMYCIN HCL 1500 MG/300ML IV SOLN
1500.0000 mg | INTRAVENOUS | Status: AC
Start: 2024-05-29 — End: 2024-06-01
  Administered 2024-05-29 – 2024-06-01 (×4): 1500 mg via INTRAVENOUS
  Filled 2024-05-29 (×4): qty 300

## 2024-05-29 MED ORDER — HEPARIN SODIUM (PORCINE) 1000 UNIT/ML IJ SOLN
INTRAMUSCULAR | Status: DC | PRN
  Administered 2024-05-29: 15000 [IU] via INTRAVENOUS

## 2024-05-29 MED ORDER — SODIUM CHLORIDE 0.9% IV SOLUTION: Freq: Once | INTRAVENOUS | Status: DC

## 2024-05-29 MED ORDER — HEPARIN 6000 UNIT IRRIGATION SOLUTION
Status: DC | PRN
  Administered 2024-05-29: 1

## 2024-05-29 MED ORDER — ORAL CARE MOUTH RINSE: 15.0000 mL | OROMUCOSAL | Status: DC | PRN

## 2024-05-29 MED ORDER — ROCURONIUM BROMIDE 10 MG/ML (PF) SYRINGE
PREFILLED_SYRINGE | INTRAVENOUS | Status: DC | PRN
  Administered 2024-05-29 (×3): 100 mg via INTRAVENOUS

## 2024-05-29 MED ORDER — POTASSIUM CHLORIDE 20 MEQ PO PACK: 20.0000 meq | PACK | ORAL | Status: DC

## 2024-05-29 MED ORDER — SODIUM BICARBONATE 8.4 % IV SOLN
INTRAVENOUS | Status: DC
  Filled 2024-05-29: qty 25

## 2024-05-29 MED ORDER — FENTANYL CITRATE (PF) 250 MCG/5ML IJ SOLN
INTRAMUSCULAR | Status: AC
  Filled 2024-05-29: qty 5

## 2024-05-29 MED ORDER — HEPARIN 6000 UNIT IRRIGATION SOLUTION
Status: AC
  Filled 2024-05-29: qty 500

## 2024-05-29 MED ORDER — ORAL CARE MOUTH RINSE
15.0000 mL | OROMUCOSAL | Status: DC
  Administered 2024-05-29 – 2024-06-01 (×29): 15 mL via OROMUCOSAL

## 2024-05-29 MED ORDER — FENTANYL CITRATE (PF) 250 MCG/5ML IJ SOLN
INTRAMUSCULAR | Status: DC | PRN
Start: 2024-05-29 — End: 2024-05-29
  Administered 2024-05-29 (×5): 50 ug via INTRAVENOUS

## 2024-05-29 MED ORDER — SODIUM CHLORIDE 0.9% FLUSH
10.0000 mL | Freq: Two times a day (BID) | INTRAVENOUS | Status: DC
  Administered 2024-05-29 – 2024-05-30 (×3): 10 mL
  Administered 2024-05-31: 30 mL
  Administered 2024-06-01 – 2024-06-02 (×4): 10 mL
  Administered 2024-06-03: 30 mL
  Administered 2024-06-03 – 2024-06-04 (×2): 10 mL

## 2024-05-29 MED ORDER — PROTAMINE SULFATE 10 MG/ML IV SOLN
INTRAVENOUS | Status: DC | PRN
  Administered 2024-05-29: 150 mg via INTRAVENOUS

## 2024-05-29 MED ORDER — ROCURONIUM BROMIDE 10 MG/ML (PF) SYRINGE
PREFILLED_SYRINGE | INTRAVENOUS | Status: AC
  Filled 2024-05-29: qty 20

## 2024-05-29 MED ORDER — ETOMIDATE 2 MG/ML IV SOLN: 20.0000 mg | Freq: Once | INTRAVENOUS | Status: AC

## 2024-05-29 MED ORDER — ETOMIDATE 2 MG/ML IV SOLN
INTRAVENOUS | Status: AC
  Administered 2024-05-29: 20 mg via INTRAVENOUS
  Filled 2024-05-29: qty 20

## 2024-05-29 MED FILL — Heparin Sodium (Porcine) Inj 1000 Unit/ML: INTRAMUSCULAR | Qty: 20 | Status: AC

## 2024-05-29 MED FILL — Sodium Bicarbonate IV Soln 8.4%: INTRAVENOUS | Qty: 100 | Status: AC

## 2024-05-29 MED FILL — Sodium Chloride IV Soln 0.9%: INTRAVENOUS | Qty: 3000 | Status: AC

## 2024-05-29 MED FILL — Electrolyte-R (PH 7.4) Solution: INTRAVENOUS | Qty: 5000 | Status: AC

## 2024-05-29 SURGICAL SUPPLY — 47 items
APPLICATOR TIP COSEAL (VASCULAR PRODUCTS) IMPLANT
BLADE CLIPPER SURG (BLADE) ×1 IMPLANT
BNDG GAUZE DERMACEA FLUFF 4 (GAUZE/BANDAGES/DRESSINGS) IMPLANT
CATH DIAG EXPO 6F AL1 (CATHETERS) ×1 IMPLANT
CATH INFINITI 5FR ANG PIGTAIL (CATHETERS) IMPLANT
CATH INFINITI 6F MPB2 (CATHETERS) ×1 IMPLANT
CONTAINER PROTECT SURGISLUSH (MISCELLANEOUS) ×1 IMPLANT
DRAPE C-ARM 42X72 X-RAY (DRAPES) ×2 IMPLANT
DRAPE CV SPLIT W-CLR ANES SCRN (DRAPES) ×1 IMPLANT
DRAPE INCISE IOBAN 66X45 STRL (DRAPES) IMPLANT
DRAPE PERI GROIN 82X75IN TIB (DRAPES) ×1 IMPLANT
DRAPE WARM FLUID 44X44 (DRAPES) ×1 IMPLANT
DRSG TEGADERM 4X4.75 (GAUZE/BANDAGES/DRESSINGS) IMPLANT
ELECTRODE BLDE 4.0 EZ CLN MEGD (MISCELLANEOUS) IMPLANT
GAUZE 4X4 16PLY ~~LOC~~+RFID DBL (SPONGE) ×1 IMPLANT
GAUZE SPONGE 4X4 12PLY STRL (GAUZE/BANDAGES/DRESSINGS) IMPLANT
GLOVE BIOGEL M STRL SZ7.5 (GLOVE) ×3 IMPLANT
GOWN STRL REUS W/ TWL LRG LVL3 (GOWN DISPOSABLE) ×4 IMPLANT
GOWN STRL SURGICAL XL XLNG (GOWN DISPOSABLE) ×1 IMPLANT
GRAFT VASC STRG 30X10STRL (Graft) IMPLANT
INSERT FOGARTY SM (MISCELLANEOUS) ×1 IMPLANT
KIT BASIN OR (CUSTOM PROCEDURE TRAY) ×1 IMPLANT
LOOP VASCLR MAXI BLUE 18IN ST (MISCELLANEOUS) ×1 IMPLANT
PACK CHEST (CUSTOM PROCEDURE TRAY) ×1 IMPLANT
PAD ARMBOARD POSITIONER FOAM (MISCELLANEOUS) ×2 IMPLANT
PAD ELECT DEFIB RADIOL ZOLL (MISCELLANEOUS) ×1 IMPLANT
PUMP SET IMPELLA 5.5 US (CATHETERS) ×1 IMPLANT
SEALANT HEMOST PREVELEAK 4ML (HEMOSTASIS) IMPLANT
SEALANT SURG COSEAL 8ML (VASCULAR PRODUCTS) ×1 IMPLANT
SOLN 0.9% NACL POUR BTL 1000ML (IV SOLUTION) ×4 IMPLANT
SOLN STERILE WATER BTL 1000 ML (IV SOLUTION) ×2 IMPLANT
SPONGE T-LAP 18X18 ~~LOC~~+RFID (SPONGE) ×4 IMPLANT
STAPLER SKIN PROX WIDE 3.9 (STAPLE) IMPLANT
SUT ETHILON 3 0 PS 1 (SUTURE) ×2 IMPLANT
SUT PROLENE 5 0 C 1 36 (SUTURE) IMPLANT
SUT PROLENE 5 0 C1 (SUTURE) ×2 IMPLANT
SUT SILK 1 MH (SUTURE) ×4 IMPLANT
SUT SILK 1 TIES 10X30 (SUTURE) ×1 IMPLANT
SUT VIC AB 2-0 CT1 18 (SUTURE) ×2 IMPLANT
SUT VIC AB 2-0 CT1 TAPERPNT 27 (SUTURE) IMPLANT
SUT VIC AB 3-0 X1 27 (SUTURE) ×1 IMPLANT
TAPE CLOTH SURG 4X10 WHT LF (GAUZE/BANDAGES/DRESSINGS) IMPLANT
TOWEL GREEN STERILE (TOWEL DISPOSABLE) ×1 IMPLANT
TOWEL GREEN STERILE FF (TOWEL DISPOSABLE) ×1 IMPLANT
TRAY FOLEY SLVR 16FR TEMP STAT (SET/KITS/TRAYS/PACK) ×1 IMPLANT
VASCULAR TIE MINI RED 18IN STL (MISCELLANEOUS) ×1 IMPLANT
WIRE EMERALD 3MM-J .035X260CM (WIRE) IMPLANT

## 2024-05-29 NOTE — Anesthesia Postprocedure Evaluation (Signed)
 Anesthesia Post Note  Patient: Thomas [name]  Procedure(s) Performed: INSERTION OF CARDIAC ASSIST DEVICE, IMPELLA 5.5 (Right: Axilla)     Patient location during evaluation: ICU Anesthesia Type: General Level of consciousness: sedated Pain management: pain level controlled Vital Signs Assessment: post-procedure vital signs reviewed and stable Respiratory status: patient remains intubated per anesthesia plan Cardiovascular status: stable Postop Assessment: no apparent nausea or vomiting Anesthetic complications: no   No notable events documented.  Last Vitals:  Vitals:   05/29/24 1503 05/29/24 1840  BP: 97/78   Pulse: 83 (!) 132  Resp: 15 15  Temp: 36.5 C   SpO2: 98%     Last Pain:  Vitals:   05/29/24 1503  TempSrc: Core  PainSc:                  Thomas [name]

## 2024-05-29 NOTE — Op Note (Signed)
     [address] [place] [address]             [phone]                                         05/29/2024 Patient:  Thomas [name] Pre-Op Dx:  Cardiogenic Shock   S/p CABG   CRI   HIV Post-op Dx:  same Procedure: Right axillary artery cannulation with 10 mm graft Insertion of 5.5 Impella  Surgeon and Role:      * Glenna Brunkow, Linnie KIDD, MD - Primary    Anesthesia  general EBL:  Blood Administration: none  Counts: correct   Indications: 43yo male s/p CABG, who required ECMO cannulation for post-op cardiogenic shock.  He is brought to the OR for Impella cannulation.  Findings: Normal anatomy.  Operative Technique: All invasive lines were placed in pre-op holding.  After the risks, benefits and alternatives were thoroughly discussed, the patient was brought to the operative theatre.  Anesthesia was induced, and the patient was prepped and draped in normal sterile fashion.  An appropriate surgical pause was performed, and pre-operative antibiotics were dosed accordingly.  We began with an incision over the right deltopectoral groove.  This was carried down with a combination of Bovie cautery and blunt dissection until we reached the right axillary artery.  Patient was then systemically heparinized, and the axillary artery was encircled and clamped.  An arteriotomy was created and an 10 mm graft was sewn to the axillary artery in an end-to-side fashion.  This would serve as our arterial cannulation site.  The peel away sheath was inserted into the end of the graft and fastened with the sleeve connectors to provide hemostasis. A .035 J-wire was advanced through the sheath into the ascending aorta under fluoroscopic guidance usin C-arm. A pig tail catheter was inserted over the wire and then advanced across the aortic valve without difficulty to the LV apex. The J-wire was changed out for 0.18 wire and it was positioned with a gentle curve in the LV  apex. The pigtail was removed. Then the graft was clamped adjacent to the anastomosis and the 5.5 Impella inserted through the sheath. Before full insertion it was burped by removing the clamp on the graft temporarily. The Impella was advanced fully into the graft and the clamp removed. The Impella was advanced into the LV and the wire removed.  The graft was again clamped and the graft was cut flush with the skin. The sheath was inserted into the end of the graft and it was fastened with 1-0 silk ties for hemostasis. The sheath was attached to the skin with 0-silk sutures.  The wound was hemostatic.  The soft tissue and skin were re-approximated wth absorbable suture.    The patient tolerated the procedure without any immediate complications, and was transferred to the ICU in guarded condition.  [name]

## 2024-05-29 NOTE — Progress Notes (Signed)
 Initial Nutrition Assessment  DOCUMENTATION CODES:   Not applicable  INTERVENTION:  Recommend initiating trickle feeds via OG after OR today to help stimulate gut and prevent atropy Vital 1.5 @ 20 ml/h (480 ml per day) Trickles provides 720 kcal, 32 g protein per day  Propofol  at current rate provides additional 1050 kcal per day  Possible refeeding risk given unknown hx, recommend adding thiamine  100 mg daily and monitoring electrolytes when tube feeding initiated  Recommend Cortrak placement when possible if pt requires prolonged vent support  Tube feeding goal rate: recommend titrating slowly once advancement approved, increasing 10 ml q12h until goal rate reached Vital 1.5 at 60 ml/h (1440 ml per day) Prosource TF20 60 ml BID Provides 2320 kcal, 137 gm protein, 1100 ml free water  daily  NUTRITION DIAGNOSIS:   Increased nutrient needs related to acute illness (ECMO support s/p CABG) as evidenced by estimated needs.  GOAL:   Patient will meet greater than or equal to 90% of their needs  MONITOR:   Vent status, TF tolerance  REASON FOR ASSESSMENT:   Consult Assessment of nutrition requirement/status (ECMO pt)  ASSESSMENT:   Pt with hx of CAD (cardiac arrest 2017), neuropathy (s/p nephrectomy), CKD IIIa, HIV, and Hodgkin's lymphoma (s/p chemo, now in remission). Admitted with chest pain, diagnosed STEMI.  11/4 admitted 11/5 CABG x 2, central ECMO cannulation, left open chest, placed on VA ECMO   Spoke with RN who reports there is plan for pt to return to the OR today 11/6 for Impella 5.5 placement with hope to decannulate tomorrow 11/7. Holding off on initiating tube feeds today pending OR. RN reports pt's gut likely malabsorbing based on previous medications administered showing up in cannister. Would recommend initiating trickle feeds post op to help stimulate gut and assess function. Pt would be good candidate for Cortrak on Friday 11/7, especially if chest remains  open after decannulation.   No family at bedside during assessment. Unable to obtain diet/wt hx. Per chart review, pt had lost 9 kg (about 20 #) between March and October of this year which is not clinically significant for the timeframe. Physical exam limited due to edema but showed no muscle or fat depletions of areas that were examined, would benefit from repeat exam when edema improves.   Patient is currently intubated on ventilator support MV: 7.7 L/min Temp (24hrs), Avg:97.3 F (36.3 C), Min:95.5 F (35.3 C), Max:97.7 F (36.5 C) MAP (a-line): 71-83 mmHg  Propofol : 39.8 ml/hr **provides 1050 kcal per day at current rate**  Admit weight: 94.8 kg  Current weight: 96.8 kg   Intake/Output Summary (Last 24 hours) at 05/29/2024 1309 Last data filed at 05/29/2024 1200 Gross per 24 hour  Intake 6929.98 ml  Output 5793 ml  Net 1136.98 ml   Net IO Since Admission: 1,198.94 mL [05/29/24 1309]  Drains/Lines: A line L radial Sheath R venous Introducer Double lumen R internal jugular Pulmonary artery cath R ECMO Cannulas Single Lumen L pleural chest tube Medial mediastinal Chest tube OG Urethral catheter  Output: UOP: 4450 ml x 24 hr OG: 460 ml x 24 hr Chest tube: 534 ml x 24 hr + 34 ml x 12 hr  Nutritionally Relevant Medications: Scheduled Meds:  atorvastatin   40 mg Oral Daily   bisacodyl   10 mg Oral Daily   Or   bisacodyl   10 mg Rectal Daily   docusate  100 mg Per Tube BID   metoCLOPramide (REGLAN) injection  10 mg Intravenous Q6H   pantoprazole  (  PROTONIX ) IV  40 mg Intravenous QHS   polyethylene glycol  17 g Per Tube Daily   Continuous Infusions:  sodium chloride       ceFAZolin  (ANCEF ) IV Stopped (05/29/24 0836)   clevidipine Stopped (05/28/24 1724)   epinephrine  Stopped (05/29/24 9187)   fentaNYL  infusion INTRAVENOUS 150 mcg/hr (05/29/24 1100)   insulin  Stopped (05/29/24 1029)   lactated ringers  20 mL/hr at 05/29/24 1100   milrinone 0.375 mcg/kg/min (05/29/24  1100)   norepinephrine  (LEVOPHED ) Adult infusion 4 mcg/min (05/29/24 1100)   propofol  (DIPRIVAN ) infusion 70 mcg/kg/min (05/29/24 1100)   vasopressin  0.04 Units/min (05/29/24 1100)   PRN Meds:. albumin human, dextrose , fentaNYL , midazolam  PF, morphine  injection, ondansetron  (ZOFRAN ) IV, oxyCODONE , traMADol  Labs Reviewed: Adjusted calcium  9.78 (WNL) Creatinine 2.18 Mag 2.5 (high) Albumin 3.1 CBG ranges from 98-161 mg/dL over the last 24 hours HgbA1c 5.2    NUTRITION - FOCUSED PHYSICAL EXAM:  Flowsheet Row Most Recent Value  Orbital Region Unable to assess  Upper Arm Region No depletion  Thoracic and Lumbar Region No depletion  Buccal Region Unable to assess  Temple Region No depletion  Clavicle Bone Region No depletion  Clavicle and Acromion Bone Region No depletion  Scapular Bone Region No depletion  Dorsal Hand Unable to assess  [edema]  Patellar Region Unable to assess  [edema]  Anterior Thigh Region Unable to assess  [edema]  Posterior Calf Region Unable to assess  [edema]  Edema (RD Assessment) Mild  [non pitting, BLE, bilateral hands]  Hair Reviewed  Eyes Unable to assess  Mouth Unable to assess  Skin Reviewed  Nails Reviewed    Diet Order:   Diet Order             Diet NPO time specified  Diet effective now                   EDUCATION NEEDS:   Not appropriate for education at this time  Skin:  Skin Assessment: Skin Integrity Issues: Skin Integrity Issues:: Incisions Incisions: R leg incision closed; open chest incision  Last BM:  unknown  Height:   Ht Readings from Last 1 Encounters:  05/27/24 6' 1 (1.854 m)    Weight:   Wt Readings from Last 1 Encounters:  05/29/24 96.8 kg    Ideal Body Weight:  83.64 kg  BMI:  Body mass index is 28.16 kg/m.  Estimated Nutritional Needs:   Kcal:  2300-2500  Protein:  140-160g  Fluid:  > 2L    Josette Glance, MS, RDN, LDN Clinical Dietitian I Please reach out via secure chat

## 2024-05-29 NOTE — CV Procedure (Signed)
 ECMO NOTE:   Indication: Post-cardiotomy shock   Initial cannulation date: 05/28/24   ECMO type: Central VA ECMO   Dual lumen inflow/return cannula:   1) Central ascending 20FR Ao return 2) Central 29 FR RA triple-stage drainage    Daily data:   Flow 4.6L RPM 3375 Sweep  3L   Labs:   ABG    Component Value Date/Time   PHART 7.385 05/29/2024 0309   PCO2ART 30.0 (L) 05/29/2024 0309   PO2ART 69 (L) 05/29/2024 0309   HCO3 18.1 (L) 05/29/2024 0309   TCO2 19 (L) 05/29/2024 0309   ACIDBASEDEF 6.0 (H) 05/29/2024 0309   O2SAT 94 05/29/2024 0309    Hgb 8.7 Platelets 75k LDH 220 PTT  Holding bivalrudin currently Lactic acid 0.7   Plan:  Continue ECMO support No AC overnight, anticipate Impella 5.5 placement this afternoon Turned down flows to 3.5L with prompt loss of pressures Remains on low dose vaso, epi, NE, and 0.375 of milrinone, continue for now [facility] that after 5.5 is placed we can proceed with decannulation   Morene JINNY Brownie, MD  10:35 AM

## 2024-05-29 NOTE — Consult Note (Signed)
 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   Cardiac arrest (HCC) 05/15/2014   Chronic renal insufficiency    CKD (chronic kidney disease) s/p Right nephrectomy (04/30/2014) 05/19/2014   CLABSI (central line-associated bloodstream infection) 02/27/2014   Enteritis 01/22/2014   History of blood transfusion july 2015 per pt   HIV disease (HCC) 06/25/2012   Hydronephrosis, right & hydroureter: secondary to obstructing ureteral calculi 03/01/2014   Leg pain 07/03/2012   Nephrolithiasis    NSTEMI (non-ST elevated myocardial infarction) (HCC) 05/19/2014   Obstructive uropathy 12/12/2014   Status post left renal stent   Penile cyst 08/06/2012   Septic shock (HCC) 01/18/2014   Shingles rash 02/25/2014   Sinus tachycardia 08/06/2012   Skin lesion 07/03/2012   Staphylococcus aureus bacteremia 02/25/2014   Transaminitis 05/19/2014    Subjective:   This NP Camellia Kays reviewed medical records, received report from team, assessed the patient and then meet at the patient's bedside to discuss diagnosis, prognosis, GOC, EOL wishes disposition and options.  Before meeting with the patient/family, I spent time reviewing the chart notes including admission H&P, CVTS note from yesterday, PCCM note from yesterday, cardiology note from yesterday, op note from yesterday, multiple other cardiology and CVTS notes from yesterday, PCCM note from today, CVTS note from today. I also reviewed vital signs, nursing flowsheets, medication administrations record,  labs, and imaging. Labs reviewed include CBC which shows hemoglobin improved to 8.1 from 6.8, was 8.7 earlier in the day query erroneous value of 6.8 in the setting of severe illness and ECMO.  CMP shows improved creatinine at 1.51 compared to 2.18 earlier today in the setting of ECMO, status post cardiac arrest.  I met with the patient at the bedside although he is unable to meaningfully communicate.  His mother [place] Father Jerel, along with Angeline, Are also present.   We meet to discuss diagnosis prognosis, GOC, EOL wishes, disposition and options. Concept of Palliative Care was introduced as specialized medical care for people and their families living with serious illness.  If focuses on providing relief from the symptoms and stress of a serious illness.  The goal is to improve quality of life for both the patient and the family. Values and goals of care important to patient and family were attempted to be elicited.  Created space and opportunity for patient  and family to explore thoughts and feelings regarding current medical situation   Natural trajectory and current clinical status were discussed. Questions and concerns addressed. Patient  encouraged to call with questions or concerns.    Patient/Family Understanding of Illness: They understand that he is very sick and on life support.  They understand that they are planning to add an Impella device today with a goal of trying to wean and decannulate from ECMO.  I understand he is requiring the ventilator to breathe.  We spent time talking about specifics and details related to  his clinical picture.  Heart failure is also spent extensive amount of time discussing plans for today as well.  Life Review: The patient is described as kind, funny, livable.  He knows no strangers and is free hearted and loving.  He enjoys playing his PS 5.  Baseline Status: Functionally independent  Today's Discussion: In addition to discussions described  above we had extensive discussion on various topics.  I discussed the primary role of palliative medicine, currently, with an ECMO patient has ongoing support while they are working day-to-day on serious illness.  I spent time answering questions.  Family describes a strong support system and are leaning on each other for support.  We spent time talking about the patient and things that are meaningful to him.  We talked about the current plan of full code and full scope of care, ongoing aggressive interventions to prolong life and improve his quality of life.  Talked about the possibility of further discussions pending how he does in the coming days.  Family is wondering when he will be able to come off the ventilator and we discussed that it would be a long road to recovery, ventilator would be removed as soon as it can safely be done.  I provided contact information for the palliative medicine team.  I shared that we are available for any questions or concerns while the patient is admitted.  At this time we would back off over the weekend to allow time for Impella, ECMO weaning, and outcomes.  I shared that I would return early next week and would follow-up at that time.  I provided emotional and general support through therapeutic listening, empathy, sharing of stories, and other techniques. I answered all questions and addressed all concerns to the best of my ability.  Review of Systems  Unable to perform ROS   Objective:   Primary Diagnoses: Present on Admission:  STEMI involving left anterior descending coronary artery (HCC)  Cardiogenic shock (HCC)   Vital Signs:  BP 97/78 (BP Location: Other (Comment))   Pulse 83   Temp 97.7 F (36.5 C) (Core)   Resp 15   Ht 6' 1 (1.854 m)   Wt 96.8 kg   SpO2 98%   BMI 28.16 kg/m   Physical Exam Vitals and nursing note reviewed.  Constitutional:      General: He is sleeping. He is not in acute distress.    Appearance: He is ill-appearing.      Interventions: He is sedated and intubated.  HENT:     Head: Normocephalic and atraumatic.  Cardiovascular:     Rate and Rhythm: Normal rate.  Pulmonary:     Effort: Pulmonary effort is normal. He is intubated.  Abdominal:     General: Abdomen is flat.  Skin:    General: Skin is warm and dry.     Palliative Assessment/Data: 10%   Assessment & Plan:   HPI/Patient Profile: 43 y.o. male  with past medical history of CAD c/b cardiac arrest s/p PCI to OM1 in 2017, s/p right nephrectomy, CKD stage IIIa, HIV (on HAART), Hogdkin's lymphoma (s/p chemo now in remission) who was admitted 05/27/2024 with chest pain and found to have lateral STEMI.  His hospital stay has been complicated by successful PTCA to ostial LAD, CABG x 2 complicated by cardiogenic shock requiring central ECMO cannulation to come off bypass.   Palliative medicine has been consulted for ongoing support of patient's family and ECMO status.  SUMMARY OF RECOMMENDATIONS  Full code Full scope of care Ongoing support of patient's family Palliative medicine will follow-up early next week Please notify us  of any significant palliative needs in the interim  Symptom Management:  Per primary team Palliative medicine is available to assist as needed  Code Status: Full Code  Prognosis:  Unable to determine  Discharge Planning:  To Be Determined   Discussed with: Patient's family, medical team, nursing team    Thank you for allowing us  to participate in the care of CRUISE BAUMGARDNER PMT will continue to support holistically.  Billing based on MDM: Moderate  Detailed review of medical records (labs, imaging, vital signs), medically appropriate exam, discussed with treatment team, counseling and education to patient, family, & staff, documenting clinical information, medication management, coordination of care  Signed by: Camellia Kays, NP Palliative Medicine Team  Team Phone # [phone] (Nights/Weekends)   05/29/2024, 3:48 PM

## 2024-05-29 NOTE — Anesthesia Preprocedure Evaluation (Addendum)
 Anesthesia Evaluation  Patient identified by MRN, date of birth, ID band Patient unresponsive    Reviewed: Allergy & Precautions, NPO status , Patient's Chart, lab work & pertinent test results  History of Anesthesia Complications Negative for: history of anesthetic complications  Airway Mallampati: Intubated       Dental   Pulmonary former smoker      + intubated    Cardiovascular + angina  + Past MI and +CHF   Rhythm:Regular     Neuro/Psych    GI/Hepatic   Endo/Other    Renal/GU Renal disease     Musculoskeletal   Abdominal   Peds  Hematology  (+) Blood dyscrasia, anemia   Anesthesia Other Findings   Reproductive/Obstetrics                              Anesthesia Physical Anesthesia Plan  ASA: 4  Anesthesia Plan: General   Post-op Pain Management:    Induction: Inhalational  PONV Risk Score and Plan: 2 and Ondansetron   Airway Management Planned: Oral ETT  Additional Equipment: Arterial line, CVP and TEE  Intra-op Plan:   Post-operative Plan: Post-operative intubation/ventilation  Informed Consent: I have reviewed the patients History and Physical, chart, labs and discussed the procedure including the risks, benefits and alternatives for the proposed anesthesia with the patient or authorized representative who has indicated his/her understanding and acceptance.     History available from chart only and Consent reviewed with POA  Plan Discussed with: CRNA  Anesthesia Plan Comments:          Anesthesia Quick Evaluation

## 2024-05-29 NOTE — Progress Notes (Signed)
 [address]       [place] [address]             [phone]                 1 Day Post-Op Procedure(s) (LRB): CORONARY ARTERY BYPASS GRAFTING (CABG) TIMES TWO, UTILIZING LEFT INTERNAL MAMMARY ARTERY AND ENDOVEIN HARVEST RIGHT GREATER SAPHENOUS VEIN (N/A) ECHOCARDIOGRAM, TRANSESOPHAGEAL (N/A) CANNULATION FOR ECMO (EXTRACORPOREAL MEMBRANE OXYGENATION)   Events: No major events overnight Some flow and agitation issues _______________________________________________________________ Vitals: BP (!) 119/93   Pulse 86   Temp (!) 97.5 F (36.4 C)   Resp 15   Ht 6' 1 (1.854 m)   Wt 96.8 kg   SpO2 100%   BMI 28.16 kg/m  Filed Weights   05/27/24 0728 05/29/24 0511  Weight: 94.8 kg 96.8 kg     - Neuro: sedated  - Cardiovascular: sinus  Drips: epi 2, levo 3, milr 0.375, vaso 0.03 ECMO: 4.8L .   PAP: (6-16)/(0-12) 11/8 CVP:  [0 mmHg-10 mmHg] 10 mmHg CO:  [6.9 L/min-19 L/min] 12.4 L/min CI:  [3.1 L/min/m2-8.7 L/min/m2] 5.7 L/min/m2  - Pulm:  Vent Mode: PCV FiO2 (%):  [50 %] 50 % Set Rate:  [15 bmp-16 bmp] 15 bmp Vt Set:  [640 mL] 640 mL PEEP:  [5 cmH20-10 cmH20] 10 cmH20 Plateau Pressure:  [19 cmH20-20 cmH20] 19 cmH20  ABG    Component Value Date/Time   PHART 7.385 05/29/2024 0309   PCO2ART 30.0 (L) 05/29/2024 0309   PO2ART 69 (L) 05/29/2024 0309   HCO3 18.1 (L) 05/29/2024 0309   TCO2 19 (L) 05/29/2024 0309   ACIDBASEDEF 6.0 (H) 05/29/2024 0309   O2SAT 94 05/29/2024 0309    - Abd: ND - Extremity: warm  .Intake/Output      11/05 0701 11/06 0700 11/06 0701 11/07 0700   P.O.     I.V. (mL/kg) 3472.8 (35.9)    Blood 1348    IV Piggyback 2530.7    Total Intake(mL/kg) 7351.5 (75.9)    Urine (mL/kg/hr) 4450 (1.9)    Emesis/NG output 260    Chest Tube 534    Total Output 5244    Net +2107.5            _______________________________________________________________ Labs:    Latest Ref Rng & Units 05/29/2024    5:00 AM 05/29/2024     3:09 AM 05/28/2024    7:41 PM  CBC  WBC 4.0 - 10.5 K/uL 4.4     Hemoglobin 13.0 - 17.0 g/dL 8.7  7.1  8.2   Hematocrit 39.0 - 52.0 % 24.7  21.0  24.0   Platelets 150 - 400 K/uL 75         Latest Ref Rng & Units 05/29/2024    5:00 AM 05/29/2024    3:09 AM 05/28/2024    7:41 PM  CMP  Glucose 70 - 99 mg/dL 869     BUN 6 - 20 mg/dL 11     Creatinine 9.38 - 1.24 mg/dL 7.81     Sodium 864 - 854 mmol/L 140  146  145   Potassium 3.5 - 5.1 mmol/L 3.5  3.2  3.8   Chloride 98 - 111 mmol/L 109     CO2 22 - 32 mmol/L 21     Calcium  8.9 - 10.3 mg/dL 7.3     Total Protein 6.5 - 8.1 g/dL 4.5     Total Bilirubin 0.0 - 1.2 mg/dL 0.8  Alkaline Phos 38 - 126 U/L 17     AST 15 - 41 U/L 25     ALT 0 - 44 U/L 9       CXR: PV congestion  _______________________________________________________________  Assessment and Plan: POD 1 s/p CABG, central VA ECMO  Neuro: adjusting sedation CV: continue support.  Will plan for 5.5 impella Pulm: continue vent support Renal: creat up.  Will watch GI: npo Heme: stable ID: [email or web address] Endo: SSI Dispo: continue ICU care   Thomas [name] 05/29/2024 7:53 AM

## 2024-05-29 NOTE — Progress Notes (Signed)
 Pharmacy Antibiotic Note  Thomas [name] is a 43 y.o. male admitted on 05/27/2024 with open chest + ECMO cannulation.  Pharmacy has been consulted for vancomycin  dosing.    Plan: Vancomycin  1500 mg IV q 24 hrs. Will f/u renal function, clinical course.   Height: 6' 1 (185.4 cm) Weight: 96.8 kg (213 lb 6.5 oz) IBW/kg (Calculated) : 79.9  Temp (24hrs), Avg:97.3 F (36.3 C), Min:96.6 F (35.9 C), Max:97.7 F (36.5 C)  Recent Labs  Lab 05/27/24 0743 05/27/24 0840 05/27/24 1139 05/28/24 0243 05/28/24 1312 05/28/24 1342 05/28/24 1528 05/28/24 1534 05/28/24 1800 05/28/24 1933 05/29/24 0500 05/29/24 0705 05/29/24 0813 05/29/24 1303  WBC  --   --  5.6   < >  --   --  9.6  --  7.2 9.2 4.4  --   --  4.4  CREATININE 1.70*  --  1.80*   < > 1.60* 1.70*  --   --   --  1.85* 2.18*  --   --  1.51*  LATICACIDVEN 0.8 0.6 1.4  --   --   --   --  1.0  --   --   --  0.7  --   --   VANCORANDOM  --   --   --   --   --   --   --   --   --   --   --   --  13  --    < > = values in this interval not displayed.    Estimated Creatinine Clearance: 78.9 mL/min (A) (by C-G formula based on SCr of 1.51 mg/dL (H)).    Allergies  Allergen Reactions   Tylenol  [Acetaminophen ] Other (See Comments)    Cold sweats     Thank you for allowing pharmacy to be a part of this patient's care.  Harlene Barlow, [employer], BCCP Clinical Pharmacist  05/29/2024 4:30 PM   [facility] pharmacy phone numbers are listed on [email or web address]

## 2024-05-29 NOTE — Progress Notes (Signed)
 Decision made to proceed with Impella 5.5 placement. I accompanied the patient and Dr. Shyrl to the OR. During the procedure I was available to initially wean vasopressors and then decrease ECMO flows prior to insertion. Decreased flows further to 4L after Impella placed, Impella left at P2 with good flow. Plan to wean ECMO flow in the AM, hopefully decannulate tomorrow.  If overnight there is worsening ectopy would drop ECMO flow goal from 3.5-4L and allow for better LV filling.  CCT 60 minutes

## 2024-05-29 NOTE — Anesthesia Procedure Notes (Signed)
 Central Venous Catheter Insertion Performed by: Leopoldo Bruckner, MD, anesthesiologist Start/End11/11/2023 2:06 PM, 05/28/2024 2:16 PM Patient location: Pre-op. Preanesthetic checklist: patient identified, IV checked, risks and benefits discussed, surgical consent, monitors and equipment checked, pre-op evaluation, timeout performed and anesthesia consent Hand hygiene performed  and maximum sterile barriers used  PA cath was placed.Swan type:thermodilution Attempts: 1 Patient tolerated the procedure well with no immediate complications.

## 2024-05-29 NOTE — Anesthesia Postprocedure Evaluation (Signed)
 Anesthesia Post Note  Patient: Thomas [name]  Procedure(s) Performed: CORONARY ARTERY BYPASS GRAFTING (CABG) TIMES TWO, UTILIZING LEFT INTERNAL MAMMARY ARTERY AND ENDOVEIN HARVEST RIGHT GREATER SAPHENOUS VEIN (Chest) ECHOCARDIOGRAM, TRANSESOPHAGEAL CANNULATION FOR ECMO (EXTRACORPOREAL MEMBRANE OXYGENATION)     Patient location during evaluation: SICU Anesthesia Type: General Level of consciousness: sedated Pain management: pain level controlled Vital Signs Assessment: vitals unstable Respiratory status: patient remains intubated per anesthesia plan Cardiovascular status: unstable Postop Assessment: no apparent nausea or vomiting Anesthetic complications: no   No notable events documented.               Thomas [name]

## 2024-05-29 NOTE — Procedures (Signed)
 Bronchoscopy Procedure Note  Thomas [name]  [id number]  [birth year]  Date:05/29/24  Time:8:19 PM   Provider Performing:Surafel Hilleary P Gretta   Procedure(s):  Initial Therapeutic Aspiration of Tracheobronchial Tree [phone])  Indication(s) Mucus plugging  Consent Unable to obtain consent due to emergent nature of procedure.  Anesthesia Per MAR   Time Out Verified patient identification, verified procedure, site/side was marked, verified correct patient position, special equipment/implants available, medications/allergies/relevant history reviewed, required imaging and test results available.   Sterile Technique Usual hand hygiene, masks, gowns, and gloves were used   Procedure Description Bronchoscope advanced through endotracheal tube and into airway.  Airways were examined down to subsegmental level with findings noted below.   Following diagnostic evaluation, Therapeutic aspiration performed in R mainstem, BI, RUL, RLL  Findings: thick clear mucus throughout right lung   Complications/Tolerance None; patient tolerated the procedure well. Chest X-ray is needed post procedure.   EBL 0   Specimen(s) Bronchial secretions from R lung  Leita SHAUNNA Gretta, DO 05/29/24 8:24 PM [facility]  For contact information, see Amion. If no response to pager, please call PCCM consult pager. After hours, 7PM- 7AM, please call Elink.

## 2024-05-29 NOTE — Progress Notes (Signed)
 NAME:  Thomas [name], MRN:  [id number], DOB:  April 09, 1981, LOS: 2 ADMISSION DATE:  05/27/2024, CONSULTATION DATE:  05/28/2024 REFERRING MD:  Dr. Shyrl, CHIEF COMPLAINT:  s/p CABG, post-op vent management  History of Present Illness:  Thomas [name] is a 43 year old male with history of CAD c/b cardiac arrest s/p PCI to OM1 in 2017, s/p right nephrectomy, CKD stage IIIa, HIV (on HAART), Hogdkin's lymphoma (s/p chemo now in remission) who was admitted 05/27/2024 with chest pain and found to have lateral STEMI. He underwent LHC which showed 20% distal LM, 99% ostial LAD, 30% mid LCx, and 30% proximal RCA disease with likely chronically occluded mid LAD and OM1 stent. He underwent successful PTCA to ostial LAD and IABP placement for stabilization. TCTS was consulted for consideration of CABG.  On 11/5 he underwent CABGx2 (LIMA-diag, SVG-OM) with Dr. Shyrl. Intra-op course notable for vasoplegia and cardioplegia with inability to come off bypass due to hypotension. Patient transitioned to central VA ECMO via RA and aortic cannulation and was transferred to [facility] ICU for further management.    Intra-op he received 710 mL cell saver, 2 PRBC and 1.5L LR. UOP 575 mL. Aortic cross clamp time 53 minutes, CPB time 119 minutes. TEE showed normal RV, LV dysfunction. Pre-op EF 30-35% on epi and IABP.   Pertinent  Medical History  Per above  Significant Hospital Events: Including procedures, antibiotic start and stop dates in addition to other pertinent events   11/4 admitted for lateral STEMI underwent successful PTCA to ostial LAD  11/5 CABG x 2 (LIMA>Lcx, SVG> OM) complicated by cardiogenic shock requiring central ECMO cannulation to come off bypass.    Interim History / Subjective:  Overnight had chugging on circuit, given 2 albumins. Remained heavily sedated overnight on propofol , fentanyl . Was able to follow commands for RN.   Objective    Blood pressure (!) 119/93, pulse 86, temperature (!) 97.5 F  (36.4 C), resp. rate 15, height 6' 1 (1.854 m), weight 96.8 kg, SpO2 100%. PAP: (6-16)/(0-12) 11/8 CVP:  [0 mmHg-10 mmHg] 10 mmHg CO:  [6.9 L/min-19 L/min] 12.4 L/min CI:  [3.1 L/min/m2-8.7 L/min/m2] 5.7 L/min/m2  Vent Mode: PCV FiO2 (%):  [50 %] 50 % Set Rate:  [15 bmp-16 bmp] 15 bmp Vt Set:  [640 mL] 640 mL PEEP:  [5 cmH20-10 cmH20] 10 cmH20 Plateau Pressure:  [19 cmH20-20 cmH20] 19 cmH20   Intake/Output Summary (Last 24 hours) at 05/29/2024 0710 Last data filed at 05/29/2024 0600 Gross per 24 hour  Intake 7230.39 ml  Output 5069 ml  Net 2161.39 ml   Filed Weights   05/27/24 0728 05/29/24 0511  Weight: 94.8 kg 96.8 kg    Examination: General: critically ill appearing man lying in bed in NAD HENT: [place]/AT, eyes anicteric, ETT in place Lungs: breathing comfortably on MV, CTAB.  Minimal ETT secretions. Lung pressures improved today-- peaks in low 20s.  Cardiovascular: RRR on tele Abdomen: soft, NT Extremities: no edema Neuro: RASS -5 GU: foley with clear yellow urine  VA ECMO Flow: 3375 RPM> 4.5 L Delta P: 25 Sweep: 3L, 60% FiO2  Vent PC  15/15/10/50%   I/O +2.1L, net +1.3L for admission  BUN 11 Cr 2.18 WBC 4.4 H/H 8.7/24.7 Platelets 75 LDH 220  CXR personally reviewed> RLL infiltrate vs effusion, ETT 5.5 cm above carina. Central VA ECMO cannulas.   Bedside US  no effusion in R dependent pleural space  Resolved problem list   Assessment and Plan   Acute  post-bypass cardiogenic shock requiring central VA ECMO  CAD s/p CABG x2 (LIMA-diag, SVG-OM) Acute post-bypass vasoplegia Acute lateral STEMI s/p PCI and IABP  -post-op care per TCTS -planning for Impella 5.5 today with hopeful ECMO decannulation tomorrow -inotropes per AHF-- con't milrinone 0.375mcg; wean off epi. Con't NE to maintain MAP >65.  -vasopressin  0.04 -GDMT limited by shock and renal failure -con't holding AC -aspirin , statin -complete post-op antibiotics; adding ceftriaxone  for better GNR  coverage with open chest  Post-operative mechanical ventilation  -Con't PC while on ECMO, can switch to PRVC after. Anticipate extubation after ECMO decannulation, hopefully early in the weekend - culture sputum if he has productive cough  Hyperglycemia -leave on insulin  gtt for now -goal BG <180  AKI on CKD stage IIIa S/p right nephrectomy (04/30/2014) for nonresolving nephrolithiasis -strict I/O -maintain adequate perfusion -monitor  Expected post-operative acute blood loss anemia Expected post-operative consumptive thrombocytopenia  -3 units pRBC so far this admission -has not needed platelets -monitor  History of HIV; has had undetectable VL since 2019 -On cebenuva at home; not due to next injection yet  Case discussed during multidisciplinary ECMO rounds.   Labs   CBC: Recent Labs  Lab 05/27/24 0740 05/27/24 0743 05/27/24 1139 05/28/24 0243 05/28/24 0956 05/28/24 1146 05/28/24 1157 05/28/24 1528 05/28/24 1532 05/28/24 1800 05/28/24 1933 05/28/24 1941 05/29/24 0309 05/29/24 0500  WBC 5.2  --  5.6 6.2  --   --   --  9.6  --  7.2 9.2  --   --  4.4  NEUTROABS 2.7  --  3.1  --   --   --   --   --   --   --   --   --   --   --   HGB 13.8   < > 14.6 13.4   < > 8.9*   < > 11.2*   < > 10.6* 10.0* 8.2* 7.1* 8.7*  HCT 40.7   < > 44.5 40.2   < > 27.1*   < > 33.6*   < > 31.0* 29.9* 24.0* 21.0* 24.7*  MCV 98.5  --  100.7* 100.0  --   --   --  98.2  --  96.6 95.2  --   --  93.9  PLT 187  --  204 176  --  109*  --  90*  --  80* 94*  --   --  75*   < > = values in this interval not displayed.    Basic Metabolic Panel: Recent Labs  Lab 05/27/24 0740 05/27/24 0743 05/27/24 1139 05/28/24 0243 05/28/24 0956 05/28/24 1157 05/28/24 1307 05/28/24 1312 05/28/24 1331 05/28/24 1342 05/28/24 1532 05/28/24 1718 05/28/24 1933 05/28/24 1941 05/29/24 0309 05/29/24 0500  NA 140   < > 142 139   < > 139   < > 141   < > 140   < > 145 142 145 146* 140  K 3.2*   < > 3.7 3.6    < > 4.9   < > 4.0   < > 4.1   < > 3.9 4.2 3.8 3.2* 3.5  CL 107   < > 107 107   < > 107  --  105  --  105  --   --  111  --   --  109  CO2 21*  --  23 21*  --   --   --   --   --   --   --   --  19*  --   --  21*  GLUCOSE 104*   < > 123* 95   < > 109*  --  258*  --  209*  --   --  169*  --   --  130*  BUN 18   < > 19 13   < > 13  --  13  --  12  --   --  12  --   --  11  CREATININE 1.80*   < > 1.80* 1.53*   < > 1.50*  --  1.60*  --  1.70*  --   --  1.85*  --   --  2.18*  CALCIUM  8.3*  --  8.6* 8.5*  --   --   --   --   --   --   --   --  7.5*  --   --  7.3*  MG  --   --   --   --   --   --   --   --   --   --   --   --  3.1*  --   --  2.5*   < > = values in this interval not displayed.   GFR: Estimated Creatinine Clearance: 54.7 mL/min (A) (by C-G formula based on SCr of 2.18 mg/dL (H)). Recent Labs  Lab 05/27/24 0743 05/27/24 0840 05/27/24 1139 05/28/24 0243 05/28/24 1528 05/28/24 1534 05/28/24 1800 05/28/24 1933 05/29/24 0500  WBC  --   --  5.6   < > 9.6  --  7.2 9.2 4.4  LATICACIDVEN 0.8 0.6 1.4  --   --  1.0  --   --   --    < > = values in this interval not displayed.    Liver Function Tests: Recent Labs  Lab 05/27/24 0740 05/27/24 1139 05/28/24 1528 05/29/24 0500  AST 28 29 29 25   ALT 22 22 10 9   ALKPHOS 43 45 21* 17*  BILITOT 0.6 0.7 1.0 0.8  PROT 6.2* 6.4* 3.9* 4.5*  ALBUMIN 2.7* 3.0* 2.2* 3.1*  = ABG    Component Value Date/Time   PHART 7.385 05/29/2024 0309   PCO2ART 30.0 (L) 05/29/2024 0309   PO2ART 69 (L) 05/29/2024 0309   HCO3 18.1 (L) 05/29/2024 0309   TCO2 19 (L) 05/29/2024 0309   ACIDBASEDEF 6.0 (H) 05/29/2024 0309   O2SAT 94 05/29/2024 0309     Coagulation Profile: Recent Labs  Lab 05/27/24 0740 05/27/24 1139 05/28/24 1528 05/29/24 0500  INR 1.0 1.0 1.4* 1.4*   CBG: Recent Labs  Lab 05/28/24 2246 05/28/24 2350 05/29/24 0120 05/29/24 0244 05/29/24 0452  GLUCAP 161* 149* 150* 133* 132*   Critical care time:     This patient is  critically ill with multiple organ system failure which requires frequent high complexity decision making, assessment, support, evaluation, and titration of therapies. This was completed through the application of advanced monitoring technologies and extensive interpretation of multiple databases. During this encounter critical care time was devoted to patient care services described in this note for 45 minutes.  Leita SHAUNNA Gaskins, DO 05/29/24 12:31 PM [facility]  For contact information, see Amion. If no response to pager, please call PCCM consult pager. After hours, 7PM- 7AM, please call Elink.

## 2024-05-29 NOTE — Progress Notes (Signed)
 PT Cancellation Note  Patient Details Name: Thomas [name] MRN: [id number] DOB: 12/31/1980   Cancelled Treatment:    Reason Eval/Treat Not Completed: Patient not medically ready  Still sedated; going to OR this afternoon; possibly decannulate tomorrow per RN. Will sign off at this time.Please reorder PT when medicaly ready.  Leontine Roads, PT, DPT [facility]  Rehabilitation Services Physical Therapist Office: [phone] Website: [email or web address]  Leontine GORMAN Roads 05/29/2024, 11:48 AM

## 2024-05-29 NOTE — Transfer of Care (Signed)
 Immediate Anesthesia Transfer of Care Note  Patient: Thomas [name]  Procedure(s) Performed: INSERTION OF CARDIAC ASSIST DEVICE, IMPELLA 5.5 (Right: Axilla)  Patient Location: SICU  Anesthesia Type:General  Level of Consciousness: Patient remains intubated per anesthesia plan  Airway & Oxygen Therapy: Patient remains intubated per anesthesia plan  Post-op Assessment: Report given to RN and Post -op Vital signs reviewed and stable  Post vital signs: Reviewed and stable  Last Vitals:  Vitals Value Taken Time  BP 100/60   Temp 98   Pulse 125 05/29/24 18:48  Resp 0 05/29/24 18:48  SpO2 95 % 05/29/24 18:48  Vitals shown include unfiled device data.  Last Pain:  Vitals:   05/29/24 1503  TempSrc: Core  PainSc:          Complications: No notable events documented.

## 2024-05-29 NOTE — Progress Notes (Signed)
 Advanced Heart Failure Rounding Note  Cardiologist: None  Chief Complaint: STEMI Subjective:    Patient placed on central VA ecmo yesterday after hypotension and worsening shock while trying to come off pump for CABG. Poor distal targets, LIMA-D1, RSVG to OM.   Some occasional hypotension overnight, required 2 units of blood in OR, 1 on the floor. 1 of albumin overnight for chugging, none further. Flows around 5L.   Discussed with ECMO team, CCM, and CTS at bedside. Plan for Impella 5.5 today. ECMO flows weaned to 3L with prompt drop in BP.   IABP site clean.    Objective:   Weight Range: 96.8 kg Body mass index is 28.16 kg/m.   Vital Signs:   Temp:  [95.5 F (35.3 C)-97.7 F (36.5 C)] 97.7 F (36.5 C) (11/06 1030) Pulse Rate:  [60-114] 82 (11/06 1030) Resp:  [0-31] 15 (11/06 1030) BP: (97-100)/(75-78) 100/78 (11/06 0800) SpO2:  [96 %-100 %] 98 % (11/06 1030) FiO2 (%):  [50 %] 50 % (11/06 0800) Weight:  [96.8 kg] 96.8 kg (11/06 0511) Last BM Date :  (PTA)  Weight change: Filed Weights   05/27/24 0728 05/29/24 0511  Weight: 94.8 kg 96.8 kg    Intake/Output:   Intake/Output Summary (Last 24 hours) at 05/29/2024 1037 Last data filed at 05/29/2024 1000 Gross per 24 hour  Intake 7565.86 ml  Output 5708 ml  Net 1857.86 ml      Physical Exam    GENERAL: Intubated, sedated PULM:  Ventilated breath sounds CARDIAC:  GCE:qoju         Normal rate with regular rhythm. Central ecmo cannulation, CT with minimal output, open chest with dressing ABDOMEN: Soft, non-tender, non-distended. NEUROLOGIC: Sedated   Telemetry   Sinus rhythm  Medications:     Scheduled Medications:  acetaminophen   1,000 mg Oral Q6H   Or   acetaminophen  (TYLENOL ) oral liquid 160 mg/5 mL  1,000 mg Per Tube Q6H   aspirin  EC  325 mg Oral Daily   Or   aspirin   324 mg Per Tube Daily   atorvastatin   40 mg Oral Daily   bisacodyl   10 mg Oral Daily   Or   bisacodyl   10 mg Rectal Daily    docusate  100 mg Per Tube BID   fentaNYL  (SUBLIMAZE ) injection  25-50 mcg Intravenous Once   metoCLOPramide (REGLAN) injection  10 mg Intravenous Q6H   pantoprazole  (PROTONIX ) IV  40 mg Intravenous QHS   polyethylene glycol  17 g Per Tube Daily   sodium chloride  flush  3 mL Intravenous Q12H    Infusions:  sodium chloride      sodium chloride  Stopped (05/29/24 0700)   sodium chloride      albumin human Stopped (05/28/24 2022)    ceFAZolin  (ANCEF ) IV Stopped (05/29/24 0836)   clevidipine Stopped (05/28/24 1724)   epinephrine  Stopped (05/29/24 9187)   fentaNYL  infusion INTRAVENOUS 150 mcg/hr (05/29/24 1000)   insulin  1.4 Units/hr (05/29/24 1000)   lactated ringers  Stopped (05/28/24 1654)   lactated ringers  20 mL/hr at 05/29/24 1000   milrinone 0.375 mcg/kg/min (05/29/24 1018)   norepinephrine  (LEVOPHED ) Adult infusion 3 mcg/min (05/29/24 1000)   potassium chloride  50 mL/hr at 05/29/24 1000   propofol  (DIPRIVAN ) infusion 70 mcg/kg/min (05/29/24 1000)   vasopressin  0.04 Units/min (05/29/24 1000)    PRN Medications: sodium chloride , albumin human, dextrose , fentaNYL , midazolam  PF, morphine  injection, ondansetron  (ZOFRAN ) IV, oxyCODONE , sodium chloride  flush, traMADol    Patient Profile   Thomas [name]  is a 43 y.o. male with a PMH of CAD s/p PCI to the OM1 in 2017, chronic angina, right nephrectomy, CKD stage IIIa, HIV, hodgkin's lymphoma treated with chemotherapy who presents with STEMI. Course complicated by post cardiotomy shock and subsequent central cannulation for ECMO.  Assessment/Plan   Post cardiotomy shock:  - Cannulated 11/5 for central ECMO - EF in the lab (unloaded) around 25-30% with anterior and inferior akinesis - IABP removed 11/5 - Impella 5.5 placement today with plan for hopeful myocardial recovery. Otherwise would be a reasonable candidate for advanced therapies, renal function pending - Continue milrinone at 0.375 today, wean epi and NE as tolerated, keep  vaso - bival off with OR today - Plan for direct cannulation with impella given open chest  CAD/STEMI: Initial EKG with lateral STEMI, changes and chest pain improved in the ED but given dynamic changes taking to the cath lab. - S/p POBA to the ostial LAD - Poor stent landing zone, multivessel CAD, reduced EF - Poor CABG targets, s/p LIMA-D1, SVG-OM - Aspirin /plavix off with ecmo, will need at discharge - Statin on hold with shock  Acute systolic heart failure: - Due to ischemia - Management of shock as above  HIV:  - Continue home meds  AKI on CKD: Prior right nephrectomy due to complications from kidney stone, chronic hydronephrosis - Suspect elevated due to shock, ATN - Euvolemic, no diuresis  CRITICAL CARE Performed by: Morene JINNY Brownie   Total critical care time: 50 minutes  Critical care time was exclusive of separately billable procedures and treating other patients.  Critical care was necessary to treat or prevent imminent or life-threatening deterioration.  Critical care was time spent personally by me on the following activities: development of treatment plan with patient and/or surrogate as well as nursing, discussions with consultants, evaluation of patient's response to treatment, examination of patient, obtaining history from patient or surrogate, ordering and performing treatments and interventions, ordering and review of laboratory studies, ordering and review of radiographic studies, pulse oximetry and re-evaluation of patient's condition.

## 2024-05-30 ENCOUNTER — Encounter (HOSPITAL_COMMUNITY): Payer: Self-pay | Admitting: Thoracic Surgery (Cardiothoracic Vascular Surgery)

## 2024-05-30 ENCOUNTER — Inpatient Hospital Stay (HOSPITAL_COMMUNITY)

## 2024-05-30 ENCOUNTER — Inpatient Hospital Stay (HOSPITAL_COMMUNITY): Admitting: Certified Registered Nurse Anesthetist

## 2024-05-30 ENCOUNTER — Encounter (HOSPITAL_COMMUNITY)
Admission: EM | Disposition: A | Discharge status: Home Health | Payer: Self-pay | Source: Home / Self Care | Attending: Thoracic Surgery (Cardiothoracic Vascular Surgery)

## 2024-05-30 DIAGNOSIS — I209 Angina pectoris, unspecified: Secondary | ICD-10-CM

## 2024-05-30 DIAGNOSIS — E785 Hyperlipidemia, unspecified: Secondary | ICD-10-CM | POA: Diagnosis not present

## 2024-05-30 DIAGNOSIS — N1831 Chronic kidney disease, stage 3a: Secondary | ICD-10-CM | POA: Diagnosis not present

## 2024-05-30 DIAGNOSIS — Z95811 Presence of heart assist device: Secondary | ICD-10-CM

## 2024-05-30 DIAGNOSIS — T17998A Other foreign object in respiratory tract, part unspecified causing other injury, initial encounter: Secondary | ICD-10-CM | POA: Diagnosis not present

## 2024-05-30 DIAGNOSIS — J9601 Acute respiratory failure with hypoxia: Secondary | ICD-10-CM | POA: Diagnosis not present

## 2024-05-30 DIAGNOSIS — R570 Cardiogenic shock: Secondary | ICD-10-CM

## 2024-05-30 DIAGNOSIS — N189 Chronic kidney disease, unspecified: Secondary | ICD-10-CM

## 2024-05-30 DIAGNOSIS — R739 Hyperglycemia, unspecified: Secondary | ICD-10-CM | POA: Diagnosis not present

## 2024-05-30 DIAGNOSIS — I5021 Acute systolic (congestive) heart failure: Secondary | ICD-10-CM | POA: Diagnosis not present

## 2024-05-30 DIAGNOSIS — R4589 Other symptoms and signs involving emotional state: Secondary | ICD-10-CM

## 2024-05-30 DIAGNOSIS — N179 Acute kidney failure, unspecified: Secondary | ICD-10-CM | POA: Diagnosis not present

## 2024-05-30 DIAGNOSIS — I2102 ST elevation (STEMI) myocardial infarction involving left anterior descending coronary artery: Secondary | ICD-10-CM | POA: Diagnosis not present

## 2024-05-30 LAB — GLUCOSE, CAPILLARY
Glucose-Capillary: 100 mg/dL — ABNORMAL HIGH (ref 70–99)
Glucose-Capillary: 104 mg/dL — ABNORMAL HIGH (ref 70–99)
Glucose-Capillary: 112 mg/dL — ABNORMAL HIGH (ref 70–99)
Glucose-Capillary: 121 mg/dL — ABNORMAL HIGH (ref 70–99)
Glucose-Capillary: 122 mg/dL — ABNORMAL HIGH (ref 70–99)
Glucose-Capillary: 123 mg/dL — ABNORMAL HIGH (ref 70–99)
Glucose-Capillary: 127 mg/dL — ABNORMAL HIGH (ref 70–99)
Glucose-Capillary: 127 mg/dL — ABNORMAL HIGH (ref 70–99)
Glucose-Capillary: 135 mg/dL — ABNORMAL HIGH (ref 70–99)
Glucose-Capillary: 141 mg/dL — ABNORMAL HIGH (ref 70–99)
Glucose-Capillary: 145 mg/dL — ABNORMAL HIGH (ref 70–99)
Glucose-Capillary: 148 mg/dL — ABNORMAL HIGH (ref 70–99)
Glucose-Capillary: 154 mg/dL — ABNORMAL HIGH (ref 70–99)
Glucose-Capillary: 174 mg/dL — ABNORMAL HIGH (ref 70–99)
Glucose-Capillary: 97 mg/dL (ref 70–99)

## 2024-05-30 LAB — COMPREHENSIVE METABOLIC PANEL WITH GFR
ALT: 6 U/L (ref 0–44)
AST: 20 U/L (ref 15–41)
Albumin: 3.1 g/dL — ABNORMAL LOW (ref 3.5–5.0)
Alkaline Phosphatase: 19 U/L — ABNORMAL LOW (ref 38–126)
Anion gap: 12 (ref 5–15)
BUN: 8 mg/dL (ref 6–20)
CO2: 20 mmol/L — ABNORMAL LOW (ref 22–32)
Calcium: 7.6 mg/dL — ABNORMAL LOW (ref 8.9–10.3)
Chloride: 107 mmol/L (ref 98–111)
Creatinine, Ser: 1.4 mg/dL — ABNORMAL HIGH (ref 0.61–1.24)
GFR, Estimated: >60 mL/min (ref >60)
Glucose, Bld: 124 mg/dL — ABNORMAL HIGH (ref 70–99)
Potassium: 4 mmol/L (ref 3.5–5.1)
Sodium: 139 mmol/L (ref 135–145)
Total Bilirubin: 1.1 mg/dL (ref 0.0–1.2)
Total Protein: 4.9 g/dL — ABNORMAL LOW (ref 6.5–8.1)

## 2024-05-30 LAB — PREPARE RBC (CROSSMATCH)

## 2024-05-30 LAB — POCT I-STAT 7, (LYTES, BLD GAS, ICA,H+H)
Acid-base deficit: 6 mmol/L — ABNORMAL HIGH (ref 0.0–2.0)
Acid-base deficit: 5 mmol/L — ABNORMAL HIGH (ref 0.0–2.0)
Acid-base deficit: 5 mmol/L — ABNORMAL HIGH (ref 0.0–2.0)
Acid-base deficit: 5 mmol/L — ABNORMAL HIGH (ref 0.0–2.0)
Acid-base deficit: 7 mmol/L — ABNORMAL HIGH (ref 0.0–2.0)
Bicarbonate: 19 mmol/L — ABNORMAL LOW (ref 20.0–28.0)
Bicarbonate: 19.2 mmol/L — ABNORMAL LOW (ref 20.0–28.0)
Bicarbonate: 19.3 mmol/L — ABNORMAL LOW (ref 20.0–28.0)
Bicarbonate: 19.4 mmol/L — ABNORMAL LOW (ref 20.0–28.0)
Bicarbonate: 18.3 mmol/L — ABNORMAL LOW (ref 20.0–28.0)
Calcium, Ion: 1.09 mmol/L — ABNORMAL LOW (ref 1.15–1.40)
Calcium, Ion: 1.13 mmol/L — ABNORMAL LOW (ref 1.15–1.40)
Calcium, Ion: 1.14 mmol/L — ABNORMAL LOW (ref 1.15–1.40)
Calcium, Ion: 1.14 mmol/L — ABNORMAL LOW (ref 1.15–1.40)
Calcium, Ion: 1.21 mmol/L (ref 1.15–1.40)
HCT: 18 % — ABNORMAL LOW (ref 39.0–52.0)
HCT: 19 % — ABNORMAL LOW (ref 39.0–52.0)
HCT: 19 % — ABNORMAL LOW (ref 39.0–52.0)
HCT: 21 % — ABNORMAL LOW (ref 39.0–52.0)
HCT: 25 % — ABNORMAL LOW (ref 39.0–52.0)
Hemoglobin: 6.1 g/dL — CL (ref 13.0–17.0)
Hemoglobin: 6.5 g/dL — CL (ref 13.0–17.0)
Hemoglobin: 6.5 g/dL — CL (ref 13.0–17.0)
Hemoglobin: 7.1 g/dL — ABNORMAL LOW (ref 13.0–17.0)
Hemoglobin: 8.5 g/dL — ABNORMAL LOW (ref 13.0–17.0)
O2 Saturation: 99 %
O2 Saturation: 98 %
O2 Saturation: 96 %
O2 Saturation: 94 %
O2 Saturation: 99 %
Patient temperature: 36.7
Patient temperature: 36.8
Patient temperature: 36.8
Patient temperature: 36.7
Patient temperature: 35.8
Potassium: 3.7 mmol/L (ref 3.5–5.1)
Potassium: 3.8 mmol/L (ref 3.5–5.1)
Potassium: 3.8 mmol/L (ref 3.5–5.1)
Potassium: 3.8 mmol/L (ref 3.5–5.1)
Potassium: 4.2 mmol/L (ref 3.5–5.1)
Sodium: 141 mmol/L (ref 135–145)
Sodium: 140 mmol/L (ref 135–145)
Sodium: 140 mmol/L (ref 135–145)
Sodium: 140 mmol/L (ref 135–145)
Sodium: 140 mmol/L (ref 135–145)
TCO2: 20 mmol/L — ABNORMAL LOW (ref 22–32)
TCO2: 20 mmol/L — ABNORMAL LOW (ref 22–32)
TCO2: 20 mmol/L — ABNORMAL LOW (ref 22–32)
TCO2: 20 mmol/L — ABNORMAL LOW (ref 22–32)
TCO2: 19 mmol/L — ABNORMAL LOW (ref 22–32)
pCO2 arterial: 31.8 mmHg — ABNORMAL LOW (ref 32–48)
pCO2 arterial: 28.8 mmHg — ABNORMAL LOW (ref 32–48)
pCO2 arterial: 30.6 mmHg — ABNORMAL LOW (ref 32–48)
pCO2 arterial: 32.3 mmHg (ref 32–48)
pCO2 arterial: 32 mmHg (ref 32–48)
pH, Arterial: 7.383 (ref 7.35–7.45)
pH, Arterial: 7.432 (ref 7.35–7.45)
pH, Arterial: 7.406 (ref 7.35–7.45)
pH, Arterial: 7.386 (ref 7.35–7.45)
pH, Arterial: 7.361 (ref 7.35–7.45)
pO2, Arterial: 129 mmHg — ABNORMAL HIGH (ref 83–108)
pO2, Arterial: 91 mmHg (ref 83–108)
pO2, Arterial: 81 mmHg — ABNORMAL LOW (ref 83–108)
pO2, Arterial: 72 mmHg — ABNORMAL LOW (ref 83–108)
pO2, Arterial: 160 mmHg — ABNORMAL HIGH (ref 83–108)

## 2024-05-30 LAB — CBC
HCT: 21.5 % — ABNORMAL LOW (ref 39.0–52.0)
HCT: 21.1 % — ABNORMAL LOW (ref 39.0–52.0)
HCT: 24.9 % — ABNORMAL LOW (ref 39.0–52.0)
Hemoglobin: 7.3 g/dL — ABNORMAL LOW (ref 13.0–17.0)
Hemoglobin: 7.2 g/dL — ABNORMAL LOW (ref 13.0–17.0)
Hemoglobin: 8.6 g/dL — ABNORMAL LOW (ref 13.0–17.0)
MCH: 33 pg (ref 26.0–34.0)
MCH: 32.9 pg (ref 26.0–34.0)
MCH: 32.6 pg (ref 26.0–34.0)
MCHC: 34 g/dL (ref 30.0–36.0)
MCHC: 34.1 g/dL (ref 30.0–36.0)
MCHC: 34.5 g/dL (ref 30.0–36.0)
MCV: 97.3 fL (ref 80.0–100.0)
MCV: 96.3 fL (ref 80.0–100.0)
MCV: 94.3 fL (ref 80.0–100.0)
Platelets: 68 K/uL — ABNORMAL LOW (ref 150–400)
Platelets: 59 K/uL — ABNORMAL LOW (ref 150–400)
Platelets: 62 K/uL — ABNORMAL LOW (ref 150–400)
RBC: 2.21 MIL/uL — ABNORMAL LOW (ref 4.22–5.81)
RBC: 2.19 MIL/uL — ABNORMAL LOW (ref 4.22–5.81)
RBC: 2.64 MIL/uL — ABNORMAL LOW (ref 4.22–5.81)
RDW: 15.1 % (ref 11.5–15.5)
RDW: 15.7 % — ABNORMAL HIGH (ref 11.5–15.5)
RDW: 16.1 % — ABNORMAL HIGH (ref 11.5–15.5)
WBC: 6.7 K/uL (ref 4.0–10.5)
WBC: 6.4 K/uL (ref 4.0–10.5)
WBC: 9.4 K/uL (ref 4.0–10.5)
nRBC: 0 % (ref 0.0–0.2)
nRBC: 0 % (ref 0.0–0.2)
nRBC: 0 % (ref 0.0–0.2)

## 2024-05-30 LAB — BASIC METABOLIC PANEL WITH GFR
Anion gap: 9 (ref 5–15)
BUN: 7 mg/dL (ref 6–20)
CO2: 20 mmol/L — ABNORMAL LOW (ref 22–32)
Calcium: 7.5 mg/dL — ABNORMAL LOW (ref 8.9–10.3)
Chloride: 105 mmol/L (ref 98–111)
Creatinine, Ser: 1.32 mg/dL — ABNORMAL HIGH (ref 0.61–1.24)
GFR, Estimated: >60 mL/min (ref >60)
Glucose, Bld: 166 mg/dL — ABNORMAL HIGH (ref 70–99)
Potassium: 4 mmol/L (ref 3.5–5.1)
Sodium: 134 mmol/L — ABNORMAL LOW (ref 135–145)

## 2024-05-30 LAB — ECHO INTRAOPERATIVE TEE
Height: 73 in
Weight: 3305.14 [oz_av]

## 2024-05-30 LAB — APTT: aPTT: 33 s (ref 24–36)

## 2024-05-30 LAB — PREPARE PLATELET PHERESIS: Unit division: 0

## 2024-05-30 LAB — CG4 I-STAT (LACTIC ACID)
Lactic Acid, Venous: 0.5 mmol/L (ref 0.5–1.9)
Lactic Acid, Venous: 1.1 mmol/L (ref 0.5–1.9)

## 2024-05-30 LAB — PROTIME-INR
INR: 1.3 — ABNORMAL HIGH (ref 0.8–1.2)
Prothrombin Time: 16.5 s — ABNORMAL HIGH (ref 11.4–15.2)

## 2024-05-30 LAB — LACTATE DEHYDROGENASE: LDH: 205 U/L — ABNORMAL HIGH (ref 98–192)

## 2024-05-30 LAB — BPAM PLATELET PHERESIS
Blood Product Expiration Date: 202511082359
ISSUE DATE / TIME: 202511061457
Unit Type and Rh: 6200

## 2024-05-30 LAB — MAGNESIUM
Magnesium: 1.9 mg/dL (ref 1.7–2.4)
Magnesium: 2 mg/dL (ref 1.7–2.4)

## 2024-05-30 LAB — PHOSPHORUS: Phosphorus: 3 mg/dL (ref 2.5–4.6)

## 2024-05-30 LAB — FIBRINOGEN: Fibrinogen: 435 mg/dL (ref 210–475)

## 2024-05-30 SURGERY — DECANNULATION FOR  ECMO (EXTRACORPOREAL MEMBRANE OXYGENATION)
Anesthesia: General | Site: Chest

## 2024-05-30 MED ORDER — MAGNESIUM SULFATE 2 GM/50ML IV SOLN: 2.0000 g | Freq: Once | INTRAVENOUS | Status: DC

## 2024-05-30 MED ORDER — EPINEPHRINE HCL 5 MG/250ML IV SOLN IN NS
INTRAVENOUS | Status: DC | PRN
  Administered 2024-05-30: 2 ug/min via INTRAVENOUS

## 2024-05-30 MED ORDER — 0.9 % SODIUM CHLORIDE (POUR BTL) OPTIME
TOPICAL | Status: DC | PRN
  Administered 2024-05-30: 2000 mL

## 2024-05-30 MED ORDER — MAGNESIUM SULFATE 2 GM/50ML IV SOLN
2.0000 g | Freq: Once | INTRAVENOUS | Status: AC
  Administered 2024-05-30: 2 g via INTRAVENOUS
  Filled 2024-05-30: qty 50

## 2024-05-30 MED ORDER — ETOMIDATE 2 MG/ML IV SOLN: 10.0000 mg | Freq: Once | INTRAVENOUS | Status: AC

## 2024-05-30 MED ORDER — SODIUM CHLORIDE (PF) 0.9 % IJ SOLN
OROMUCOSAL | Status: DC | PRN
  Administered 2024-05-30 (×2): 4 mL via TOPICAL

## 2024-05-30 MED ORDER — ROCURONIUM BROMIDE 10 MG/ML (PF) SYRINGE
PREFILLED_SYRINGE | INTRAVENOUS | Status: DC | PRN
  Administered 2024-05-30: 100 mg via INTRAVENOUS
  Administered 2024-05-30: 50 mg via INTRAVENOUS

## 2024-05-30 MED ORDER — ETOMIDATE 2 MG/ML IV SOLN
INTRAVENOUS | Status: AC
Start: 2024-05-30 — End: 2024-05-30
  Administered 2024-05-30: 10 mg via INTRAVENOUS
  Filled 2024-05-30: qty 10

## 2024-05-30 MED ORDER — HEPARIN SODIUM (PORCINE) 1000 UNIT/ML IJ SOLN
INTRAMUSCULAR | Status: DC | PRN
Start: 2024-05-30 — End: 2024-05-30
  Administered 2024-05-30: 3000 [IU] via INTRAVENOUS

## 2024-05-30 MED ORDER — VASOPRESSIN 20 UNIT/ML IV SOLN
INTRAVENOUS | Status: AC
  Filled 2024-05-30: qty 1

## 2024-05-30 MED ORDER — SODIUM CHLORIDE 0.9% IV SOLUTION: Freq: Once | INTRAVENOUS | Status: AC

## 2024-05-30 SURGICAL SUPPLY — 39 items
CANISTER SUCTION 3000ML PPV (SUCTIONS) ×1 IMPLANT
DRAPE CV SPLIT W-CLR ANES SCRN (DRAPES) IMPLANT
DRAPE PERI GROIN 82X75IN TIB (DRAPES) IMPLANT
DRAPE WARM FLUID 44X44 (DRAPES) ×1 IMPLANT
DRSG AQUACEL AG ADV 3.5X10 (GAUZE/BANDAGES/DRESSINGS) ×1 IMPLANT
ELECTRODE BLDE 4.0 EZ CLN MEGD (MISCELLANEOUS) ×1 IMPLANT
ELECTRODE REM PT RTRN 9FT ADLT (ELECTROSURGICAL) ×2 IMPLANT
FELT TEFLON 1X6 (MISCELLANEOUS) ×2 IMPLANT
GAUZE SPONGE 4X4 12PLY STRL (GAUZE/BANDAGES/DRESSINGS) ×2 IMPLANT
GLOVE BIO SURGEON STRL SZ7 (GLOVE) IMPLANT
GLOVE BIOGEL PI IND STRL 7.5 (GLOVE) ×2 IMPLANT
GOWN STRL REUS W/ TWL LRG LVL3 (GOWN DISPOSABLE) ×4 IMPLANT
GOWN STRL REUS W/ TWL XL LVL3 (GOWN DISPOSABLE) ×2 IMPLANT
GOWN STRL SURGICAL XL XLNG (GOWN DISPOSABLE) ×1 IMPLANT
HEMOSTAT POWDER SURGIFOAM 1G (HEMOSTASIS) ×2 IMPLANT
INSERT SUTURE HOLDER (MISCELLANEOUS) IMPLANT
KIT BASIN OR (CUSTOM PROCEDURE TRAY) ×1 IMPLANT
KIT TURNOVER KIT B (KITS) ×1 IMPLANT
PACK E OPEN HEART (SUTURE) IMPLANT
PACK OPEN HEART (CUSTOM PROCEDURE TRAY) ×1 IMPLANT
PAD ARMBOARD POSITIONER FOAM (MISCELLANEOUS) ×2 IMPLANT
PAD ELECT DEFIB RADIOL ZOLL (MISCELLANEOUS) ×1 IMPLANT
PENCIL BUTTON HOLSTER BLD 10FT (ELECTRODE) ×1 IMPLANT
POSITIONER HEAD DONUT 9IN (MISCELLANEOUS) ×1 IMPLANT
SOLN 0.9% NACL POUR BTL 1000ML (IV SOLUTION) ×5 IMPLANT
SOLN STERILE WATER BTL 1000 ML (IV SOLUTION) ×2 IMPLANT
SUT ETHIBOND X763 2 0 SH 1 (SUTURE) IMPLANT
SUT MNCRL AB 3-0 PS2 18 (SUTURE) IMPLANT
SUT PDS AB 1 CTX 36 (SUTURE) IMPLANT
SUT PROLENE 4 0 SH DA (SUTURE) IMPLANT
SUT PROLENE 4-0 RB1 .5 CRCL 36 (SUTURE) IMPLANT
SUT PROLENE 5 0 C 1 36 (SUTURE) IMPLANT
SUT SILK 1 TIES 10X30 (SUTURE) IMPLANT
SUT SILK 2 0 SH CR/8 (SUTURE) IMPLANT
SUT VIC AB 2-0 CTX 36 (SUTURE) IMPLANT
SYSTEM SAHARA CHEST DRAIN ATS (WOUND CARE) ×1 IMPLANT
TOWEL GREEN STERILE (TOWEL DISPOSABLE) ×1 IMPLANT
TOWEL GREEN STERILE FF (TOWEL DISPOSABLE) ×1 IMPLANT
UNDERPAD 30X36 HEAVY ABSORB (UNDERPADS AND DIAPERS) ×1 IMPLANT

## 2024-05-30 NOTE — Progress Notes (Signed)
 Daily Progress Note   Date: 05/30/2024   Patient Name: Thomas [name]  DOB: August 09, 1980  MRN: [id number]  Age / Sex: 43 y.o., male  Attending Physician: Shyrl Linnie KIDD, MD Primary Care Physician: Pcp, No Admit Date: 05/27/2024 Length of Stay: 3 days  Reason for Follow-up: Establishing goals of care  Past Medical History:  Diagnosis Date   Acute respiratory failure with hypoxemia: post cardiac arrest 05/19/2014   Cancer (HCC)    hodgkins, no current treatment for   Cardiac arrest (HCC) 05/15/2014   Chronic renal insufficiency    CKD (chronic kidney disease) s/p Right nephrectomy (04/30/2014) 05/19/2014   CLABSI (central line-associated bloodstream infection) 02/27/2014   Enteritis 01/22/2014   History of blood transfusion july 2015 per pt   HIV disease (HCC) 06/25/2012   Hydronephrosis, right & hydroureter: secondary to obstructing ureteral calculi 03/01/2014   Leg pain 07/03/2012   Nephrolithiasis    NSTEMI (non-ST elevated myocardial infarction) (HCC) 05/19/2014   Obstructive uropathy 12/12/2014   Status post left renal stent   Penile cyst 08/06/2012   Septic shock (HCC) 01/18/2014   Shingles rash 02/25/2014   Sinus tachycardia 08/06/2012   Skin lesion 07/03/2012   Staphylococcus aureus bacteremia 02/25/2014   Transaminitis 05/19/2014    Subjective:   Subjective: Chart Reviewed. Updates received. Patient Assessed. Created space and opportunity for patient  and family to explore thoughts and feelings regarding current medical situation.  Today's Discussion: Today before meeting with the patient/family, I reviewed the chart notes including multiple cardiology notes from yesterday, multiple PCCM notes from yesterday, PCCM note from today, CVTS note from today, cardiology notes from today, CVTS operative note from yesterday. I also reviewed vital signs, nursing flowsheets, medication administrations record, labs, and imaging. Labs reviewed include CBC which shows stable/improved  hemoglobin 7.3 compared to 6.8 yesterday in the setting of CVTS, postop, placement.  CMP shows electrolytes including normal potassium at 4.0, slight improvement in creatinine from 1.53 yesterday to 1.40 today in the setting of STEMI, cardiogenic shock, ECMO, Impella.  Per previous conversation yesterday, plan at this time with ongoing ECMO and Impella support is full code and full scope of care.  Palliative medicine providing emotional support for patient and family, answered questions and helping with day-to-day processing.  Today saw the patient at bedside, his mother was present.  We discussed that the Impella placement went good yesterday and he tolerated well.  They have started to wean the ECMO and allowing the Impella to assist.  The goal is to try to remove the ECMO machine today.  We discussed this is a small step in the right direction and toward allowing his body to do more of the work and the machines to do less of the work.  His mother is happy about this and continues to pray for him and state his bedside.  Patient's mom states that the plan is to possibly remove the big machine later today.  I confirmed that possibly they can wean ECMO today at 1 PM and see how he does with the Impella support.  She is at times talking about acknowledging that her son will need to change some of his lifestyle, take it easier, eat better.  She discusses that she knows that she needs to do the same and will try to help him with this.  She did ask about dietitian input on the correct foods to eat so that she can start buying these now.  I encouraged her to hold off  for now, plan for dietitian discussion later in this admission after he is extubated and able to participate.  She agrees.  I provided emotional and general support through therapeutic listening, empathy, sharing of stories, therapeutic touch, and other techniques. I answered all questions and addressed all concerns to the best of my ability.  At  this point we will allow the weekend for ongoing outcomes and medical interventions.  Palliative medicine will be back on Tuesday and can check in at that time for ongoing support.  Please notify us  if significant change or any new palliative needs over the weekend.  Review of Systems  Unable to perform ROS   Objective:   Primary Diagnoses: Present on Admission:  STEMI involving left anterior descending coronary artery (HCC)  Cardiogenic shock (HCC)   Vital Signs:  BP (!) 87/66   Pulse 80   Temp 98.1 F (36.7 C)   Resp 13   Ht 6' 1 (1.854 m)   Wt 93.7 kg   SpO2 100%   BMI 27.25 kg/m   Physical Exam Vitals and nursing note reviewed.  Constitutional:      General: He is not in acute distress.    Appearance: He is ill-appearing.     Interventions: He is sedated and intubated.  HENT:     Head: Normocephalic and atraumatic.  Cardiovascular:     Rate and Rhythm: Normal rate.     Comments: Noted centrally cannulated ECMO and cannulated Impella devices Pulmonary:     Effort: Pulmonary effort is normal. No respiratory distress. He is intubated.  Abdominal:     General: Abdomen is flat.  Skin:    General: Skin is warm and dry.     Palliative Assessment/Data: 10%   Existing Vynca/ACP Documentation: None  Assessment & Plan:   HPI/Patient Profile:  43 y.o. male  with past medical history of CAD c/b cardiac arrest s/p PCI to OM1 in 2017, s/p right nephrectomy, CKD stage IIIa, HIV (on HAART), Hogdkin's lymphoma (s/p chemo now in remission) who was admitted 05/27/2024 with chest pain and found to have lateral STEMI.  His hospital stay has been complicated by successful PTCA to ostial LAD, CABG x 2 complicated by cardiogenic shock requiring central ECMO cannulation to come off bypass.    Palliative medicine has been consulted for ongoing support of patient's family and ECMO status.  SUMMARY OF RECOMMENDATIONS   Full code Full scope of care Ongoing support of patient's  family Palliative medicine will follow-up Tuesday of next week Please notify us  of any significant palliative needs in the interim  Symptom Management:  Per primary team Palliative medicine is available to assist as needed  Code Status: Full Code  Prognosis: Unable to determine  Discharge Planning: To Be Determined  Discussed with: Patient's family, medical team, nursing team, ECMO specialist  Thank you for allowing us  to participate in the care of YAQUB ARNEY PMT will continue to support holistically.  Billing based on MDM: High  Problems Addressed: One acute or chronic illness or injury that poses a threat to life or bodily function  Amount and/or Complexity of Data: Category 1:Review of prior external note(s) from each unique source, Review of the result(s) of each unique test, and Assessment requiring an independent historian(s) and Category 3:Discussion of management or test interpretation with external physician/other qualified health care professional/appropriate source (not separately reported)  Risks: N/A  Detailed review of medical records (labs, imaging, vital signs), medically appropriate exam, discussed with treatment team, counseling  and education to patient, family, & staff, documenting clinical information, medication management, coordination of care  Camellia Kays, NP Palliative Medicine Team  Team Phone # [phone] (Nights/Weekends)  03/22/2021, 8:17 AM

## 2024-05-30 NOTE — Procedures (Signed)
 Bedside Bronchoscopy Procedure Note ARVLE GRABE [id number] 09-Jan-1981  Procedure: Bronchoscopy Indications: Obtain specimens for culture and/or other diagnostic studies and Remove secretions  Procedure Details: ET Tube Size: ET Tube secured at lip (cm): Bite block in place: No In preparation for procedure, Patient hyper-oxygenated with 100 % FiO2 and Saline given via ETT (60 ml) Airway entered and the following bronchi were examined: RUL, RML, and RLL.   Bronchoscope removed.  , Patient placed back on 100% FiO2 at conclusion of procedure.    Evaluation BP (!) 87/66   Pulse 76   Temp 98.1 F (36.7 C)   Resp 15   Ht 6' 1 (1.854 m)   Wt 93.7 kg   SpO2 100%   BMI 27.25 kg/m  Breath Sounds:Clear and Diminished O2 sats: stable throughout Patient's Current Condition: stable Specimens:  Sent serosanguinous fluid Complications: No apparent complications Patient did tolerate procedure well.   Qualyn Oyervides R 05/30/2024, 5:35 PM

## 2024-05-30 NOTE — OR Nursing (Signed)
 3 hemostats and 3 keepers removed from chest (from previous case 05/28/24). X-ray attained for verification.

## 2024-05-30 NOTE — Anesthesia Postprocedure Evaluation (Signed)
 Anesthesia Post Note  Patient: Thomas [name]  Procedure(s) Performed: DECANNULATION FOR  ECMO (EXTRACORPOREAL MEMBRANE OXYGENATION) (Chest)     Patient location during evaluation: SICU Anesthesia Type: General Level of consciousness: sedated Pain management: pain level controlled Vital Signs Assessment: post-procedure vital signs reviewed and stable Respiratory status: patient remains intubated per anesthesia plan Cardiovascular status: stable Postop Assessment: no apparent nausea or vomiting Anesthetic complications: no Comments: Error is TEE report. LV unchanged s/p ECMO decannulation, not bypass. (Unable to edit TEE report)   No notable events documented.  Last Vitals:  Vitals:   05/30/24 1330 05/30/24 1345  BP:    Pulse: 74 76  Resp: 13 15  Temp: 36.7 C   SpO2: 100% 100%    Last Pain:  Vitals:   05/30/24 1011  TempSrc: Core  PainSc:                  Franky JONETTA Bald

## 2024-05-30 NOTE — Progress Notes (Signed)
 [address]       [place] [address]             [phone]                 1 Day Post-Op Procedure(s) (LRB): INSERTION OF CARDIAC ASSIST DEVICE, IMPELLA 5.5 (Right)   Events: No major events overnight bronched _______________________________________________________________ Vitals: BP (!) 88/69   Pulse 84   Temp 98.2 F (36.8 C) (Core)   Resp 15   Ht 6' 1 (1.854 m)   Wt 93.7 kg   SpO2 99%   BMI 27.25 kg/m  Filed Weights   05/27/24 0728 05/29/24 0511 05/30/24 0500  Weight: 94.8 kg 96.8 kg 93.7 kg     - Neuro: sedated  - Cardiovascular: sinus  Drips: epi 2, levo 3, milr 0.375, vaso 0.03 ECMO: 2.8L sweep 2 Impella P5 2.8L .   PAP: (10-22)/(4-16) 18/9 CVP:  [2 mmHg-14 mmHg] 8 mmHg CO:  [4.9 L/min-13.6 L/min] 6.7 L/min CI:  [2.2 L/min/m2-6.2 L/min/m2] 3.1 L/min/m2  - Pulm:  Vent Mode: PRVC FiO2 (%):  [50 %-70 %] 70 % Set Rate:  [15 bmp] 15 bmp Vt Set:  [600 mL] 600 mL PEEP:  [10 cmH20] 10 cmH20 Plateau Pressure:  [20 cmH20-25 cmH20] 20 cmH20  ABG    Component Value Date/Time   PHART 7.383 05/30/2024 0456   PCO2ART 31.8 (L) 05/30/2024 0456   PO2ART 129 (H) 05/30/2024 0456   HCO3 19.0 (L) 05/30/2024 0456   TCO2 20 (L) 05/30/2024 0456   ACIDBASEDEF 6.0 (H) 05/30/2024 0456   O2SAT 99 05/30/2024 0456    - Abd: ND - Extremity: warm  .Intake/Output      11/06 0701 11/07 0700 11/07 0701 11/08 0700   I.V. (mL/kg) 2197.4 (23.5)    Blood     Other 105.4    NG/GT 250    IV Piggyback 1263.4    Total Intake(mL/kg) 3816.2 (40.7)    Urine (mL/kg/hr) 3215 (1.4)    Emesis/NG output 400    Blood 150    Chest Tube 114    Total Output 3879    Net -62.8            _______________________________________________________________ Labs:    Latest Ref Rng & Units 05/30/2024    4:56 AM 05/30/2024    4:49 AM 05/29/2024   11:02 PM  CBC  WBC 4.0 - 10.5 K/uL  6.7    Hemoglobin 13.0 - 17.0 g/dL 6.1  7.3  6.8   Hematocrit 39.0 - 52.0 %  18.0  21.5  20.0   Platelets 150 - 400 K/uL  68        Latest Ref Rng & Units 05/30/2024    4:56 AM 05/30/2024    4:49 AM 05/29/2024   11:02 PM  CMP  Glucose 70 - 99 mg/dL  875    BUN 6 - 20 mg/dL  8    Creatinine 9.38 - 1.24 mg/dL  8.59    Sodium 864 - 854 mmol/L 141  139  142   Potassium 3.5 - 5.1 mmol/L 3.7  4.0  4.1   Chloride 98 - 111 mmol/L  107    CO2 22 - 32 mmol/L  20    Calcium  8.9 - 10.3 mg/dL  7.6    Total Protein 6.5 - 8.1 g/dL  4.9    Total Bilirubin 0.0 - 1.2 mg/dL  1.1    Alkaline Phos 38 - 126  U/L  19    AST 15 - 41 U/L  20    ALT 0 - 44 U/L  6      CXR: improved  _______________________________________________________________  Assessment and Plan: POD 2 s/p CABG, central VA ECMO, POD1 s/p 5.5 impella placement  Neuro: adjusting sedation CV: will remove ECMO today.  Tolerated ramp trial Pulm: continue vent support Renal: creat trending down GI: npo Heme: stable ID: [email or web address] Endo: SSI Dispo: continue ICU care   Thomas [name] Thomas [name] 05/30/2024 8:24 AM

## 2024-05-30 NOTE — Progress Notes (Signed)
   [address] [place] [address]             [phone]   S/p decannulation Intubated, sedated  BP (!) 87/66   Pulse 76   Temp 98.1 F (36.7 C)   Resp 15   Ht 6' 1 (1.854 m)   Wt 93.7 kg   SpO2 100%   BMI 27.25 kg/m  33/14, CVP 10 Impella 5.5, epi 3, milrinone 0.375 Norepi 2  Intake/Output Summary (Last 24 hours) at 05/30/2024 1829 Last data filed at 05/30/2024 1657 Gross per 24 hour  Intake 4086.54 ml  Output 2685 ml  Net 1401.54 ml   Minimal CT output  Elspeth C. Kerrin, MD [facility] [phone]

## 2024-05-30 NOTE — Procedures (Signed)
 Bronchoscopy Procedure Note  TREVAR BOEHRINGER  [id number]  Jan 11, 1981  Date:05/30/24  Time:5:38 PM   Provider Performing:Vasili Fok P Gretta   Procedure(s):  Subsequent Therapeutic Aspiration of Tracheobronchial Tree [phone])  Indication(s) Mucus plugging entire R lung  Consent Unable to obtain consent due to emergent nature of procedure.  Anesthesia Per MAR   Time Out Verified patient identification, verified procedure, site/side was marked, verified correct patient position, special equipment/implants available, medications/allergies/relevant history reviewed, required imaging and test results available.   Sterile Technique Usual hand hygiene, masks, gowns, and gloves were used   Procedure Description Bronchoscope advanced through endotracheal tube and into airway.  Airways were examined down to subsegmental level with findings noted below.   Following diagnostic evaluation, mucus plugging entire R lung. All airways suctioned out . Minimal secretions from left lung. RUL culture sent.   Findings: see above   Complications/Tolerance None; patient tolerated the procedure well. Chest X-ray is needed post procedure.   EBL 0   Specimen(s) Resp culture RUL  Leita SHAUNNA Gretta, DO 05/30/24 5:39 PM [facility]  For contact information, see Amion. If no response to pager, please call PCCM consult pager. After hours, 7PM- 7AM, please call Elink.

## 2024-05-30 NOTE — Progress Notes (Signed)
 NAME:  Thomas [name], MRN:  [id number], DOB:  1981-02-13, LOS: 3 ADMISSION DATE:  05/27/2024, CONSULTATION DATE:  05/28/2024 REFERRING MD:  Dr. Shyrl, CHIEF COMPLAINT:  s/p CABG, post-op vent management  History of Present Illness:  Thomas [name] is a 43 year old male with history of CAD c/b cardiac arrest s/p PCI to OM1 in 2017, s/p right nephrectomy, CKD stage IIIa, HIV (on HAART), Hogdkin's lymphoma (s/p chemo now in remission) who was admitted 05/27/2024 with chest pain and found to have lateral STEMI. He underwent LHC which showed 20% distal LM, 99% ostial LAD, 30% mid LCx, and 30% proximal RCA disease with likely chronically occluded mid LAD and OM1 stent. He underwent successful PTCA to ostial LAD and IABP placement for stabilization. TCTS was consulted for consideration of CABG.  On 11/5 he underwent CABGx2 (LIMA-diag, SVG-OM) with Dr. Shyrl. Intra-op course notable for vasoplegia and cardioplegia with inability to come off bypass due to hypotension. Patient transitioned to central VA ECMO via RA and aortic cannulation and was transferred to [facility] ICU for further management.    Intra-op he received 710 mL cell saver, 2 PRBC and 1.5L LR. UOP 575 mL. Aortic cross clamp time 53 minutes, CPB time 119 minutes. TEE showed normal RV, LV dysfunction. Pre-op EF 30-35% on epi and IABP.   Pertinent  Medical History  Per above  Significant Hospital Events: Including procedures, antibiotic start and stop dates in addition to other pertinent events   11/4 admitted for lateral STEMI underwent successful PTCA to ostial LAD  11/5 CABG x 2 (LIMA>Lcx, SVG> OM) complicated by cardiogenic shock requiring central ECMO cannulation to come off bypass.  11/6 impella 5.5 placed, bronch for mucus plugging entire R lung  Interim History / Subjective:  Overnight he required several boluses of sedation for agitation. One run of VT today.  Objective    Blood pressure 109/84, pulse 82, temperature 98.1 F (36.7  C), resp. rate (!) 4, height 6' 1 (1.854 m), weight 93.7 kg, SpO2 100%. PAP: (10-22)/(4-16) 16/9 CVP:  [2 mmHg-13 mmHg] 11 mmHg CO:  [4.9 L/min-13.6 L/min] 6.8 L/min CI:  [2.2 L/min/m2-6.2 L/min/m2] 3.1 L/min/m2  Vent Mode: PRVC FiO2 (%):  [50 %-70 %] 70 % Set Rate:  [15 bmp] 15 bmp Vt Set:  [600 mL] 600 mL PEEP:  [10 cmH20] 10 cmH20 Plateau Pressure:  [18 cmH20-25 cmH20] 21 cmH20   Intake/Output Summary (Last 24 hours) at 05/30/2024 0706 Last data filed at 05/30/2024 0700 Gross per 24 hour  Intake 3816.17 ml  Output 3879 ml  Net -62.83 ml   Filed Weights   05/27/24 0728 05/29/24 0511 05/30/24 0500  Weight: 94.8 kg 96.8 kg 93.7 kg    Examination: General: critically ill appearing man lying in bed in NAD HENT: [place]/AT, eyes anicteric Lungs: breathing comfortably on  MV, Pplat 21, minimal ETT secretions  Cardiovascular: RRR on tele, centrally cannulated for VA ECMO. Chest tubes with minimal output.  Abdomen: soft, NT Extremities: no edema or cyanosis Neuro: RASS -4 GU: foley with clear yellow urine  VA ECMO Flow: 2.5L  Impella 2.5L @ P5 [place] P: 25 Sweep: 1L, 40% FiO2  Vent PRVC 15/630/10/90%   I/O -60cc, net +1.2L for admission Chest tube 114cc  BUN 8 Cr 1.4 WBC 6.7 H/H 7.3/21.5 Platelets 68 LDH 205  CXR personally reviewed> persistent RLL opacity silhouetting hemidiaphragm  Resolved problem list   Assessment and Plan   Acute post-bypass cardiogenic shock requiring central VA ECMO  CAD  s/p CABG x2 (LIMA-diag, SVG-OM) Acute post-bypass vasoplegia Acute lateral STEMI s/p PCI and IABP  -post op care per TCTS -planning for decannulation of ECMO today, con't impella per AHF. Currently flows at 2.5L& 2.5L -inotropes per AHF-- milrinone 0.375, NE 5mcg -vasopressin  0.4 -holding AC -aspirin , statin  -ceftriaxone  and vanc while centrally cannulated  Post-operative mechanical ventilation  Mucus plugging -LTVV;  -VAP prevention protocol -PAD protocol for  sedation -daily SAT & SBT as appropriate; need to get reassess today after OR -culture sent from bronch 11/6; low suspicion for infection  Hyperglycemia -insulin  gtt, can transition off after OR today  AKI on CKD stage IIIa S/p right nephrectomy (04/30/2014) for nonresolving nephrolithiasis -strict I/O -renally dose meds, avoid nephrotoxic meds  Expected post-operative acute blood loss anemia Expected post-operative consumptive thrombocytopenia  -1 unit pRBC today -goal Hb >8  History of HIV; has had undetectable VL since 2019 -On cebenuva at home; not due to next injection yet  Case discussed during multidisciplinary ECMO rounds.  OR this afternoon for decannulation.   Labs   CBC: Recent Labs  Lab 05/27/24 0740 05/27/24 0743 05/27/24 1139 05/28/24 0243 05/28/24 1933 05/28/24 1941 05/29/24 0500 05/29/24 1303 05/29/24 1535 05/29/24 1846 05/29/24 1858 05/29/24 2139 05/29/24 2302 05/30/24 0449 05/30/24 0456  WBC 5.2  --  5.6   < > 9.2  --  4.4 4.4  --  7.2  --   --   --  6.7  --   NEUTROABS 2.7  --  3.1  --   --   --   --   --   --   --   --   --   --   --   --   HGB 13.8   < > 14.6   < > 10.0*   < > 8.7* 8.1*  6.8*   < > 8.9* 7.8* 6.5* 6.8* 7.3* 6.1*  HCT 40.7   < > 44.5   < > 29.9*   < > 24.7* 24.1*  20.0*   < > 26.2* 23.0* 19.0* 20.0* 21.5* 18.0*  MCV 98.5  --  100.7*   < > 95.2  --  93.9 95.3  --  97.0  --   --   --  97.3  --   PLT 187  --  204   < > 94*  --  75* 68*  --  60*  --   --   --  68*  --    < > = values in this interval not displayed.    Basic Metabolic Panel: Recent Labs  Lab 05/28/24 1933 05/28/24 1941 05/29/24 0500 05/29/24 1303 05/29/24 1535 05/29/24 1701 05/29/24 1858 05/29/24 1940 05/29/24 2139 05/29/24 2302 05/30/24 0449 05/30/24 0456  NA 142   < > 140 140  143   < > 144   < > 141 143 142 139 141  K 4.2   < > 3.5 4.3  4.1   < > 3.6   < > 4.0 4.0 4.1 4.0 3.7  CL 111  --  109 111  --  111  --  110  --   --  107  --   CO2 19*  --   21* 22  --   --   --  21*  --   --  20*  --   GLUCOSE 169*  --  130* 105*  --  94  --  121*  --   --  124*  --  BUN 12  --  11 9  --  8  --  7  --   --  8  --   CREATININE 1.85*  --  2.18* 1.51*  --  1.20  --  1.53*  --   --  1.40*  --   CALCIUM  7.5*  --  7.3* 7.2*  --   --   --  7.3*  --   --  7.6*  --   MG 3.1*  --  2.5* 2.1  --   --   --  2.0  --   --  1.9  --   PHOS  --   --   --   --   --   --   --   --   --   --  3.0  --    < > = values in this interval not displayed.   GFR: Estimated Creatinine Clearance: 78.5 mL/min (A) (by C-G formula based on SCr of 1.4 mg/dL (H)). Recent Labs  Lab 05/28/24 1534 05/28/24 1800 05/29/24 0500 05/29/24 0705 05/29/24 1303 05/29/24 1846 05/30/24 0449 05/30/24 0502  WBC  --    < > 4.4  --  4.4 7.2 6.7  --   LATICACIDVEN 1.0  --   --  0.7  --  0.8  --  0.5   < > = values in this interval not displayed.    Critical care time:     This patient is critically ill with multiple organ system failure which requires frequent high complexity decision making, assessment, support, evaluation, and titration of therapies. This was completed through the application of advanced monitoring technologies and extensive interpretation of multiple databases. During this encounter critical care time was devoted to patient care services described in this note for 44 minutes.  Leita SHAUNNA Gaskins, DO 05/30/24 1:32 PM [facility]  For contact information, see Amion. If no response to pager, please call PCCM consult pager. After hours, 7PM- 7AM, please call Elink.

## 2024-05-30 NOTE — Progress Notes (Addendum)
 eLink Physician-Brief Progress Note Patient Name: Thomas [name] DOB: [birth year] MRN: [id number]   Date of Service  05/30/2024  HPI/Events of Note  Asked to review KUB for OGT placement.  Only the top of the OGT is seen on abdominal xray.    eICU Interventions  Advance OGT by 4-5 cm and repeat KUB.      Intervention Category Intermediate Interventions: Diagnostic test evaluation  [name] 05/30/2024, 10:29 PM  11:10 PM Repeat KUB reviewed with the tip of the OGT in the area of the body of the stomach.   Plan< Ok to use feeding tube.

## 2024-05-30 NOTE — Progress Notes (Signed)
 Brief Nutrition Support Note  Spoke with RN who reports pt will be decannulated today and will trial weaning from ECMO. Trickle feeds not initiated prior to decannulation but RN suspects they will be initiated following procedure. Discussed recommendations left in note from 11/6 but also outlined recommendations below.  Recommend initiating trickle feeds via OG after decannulation today to help stimulate gut and prevent atropy Vital 1.5 @ 20 ml/h (480 ml per day) Trickles provides 720 kcal, 32 g protein per day  Propofol  at current rate provides additional 1501 kcal per day   Possible refeeding risk given unknown hx, recommend adding thiamine  100 mg daily and monitoring electrolytes when tube feeding initiated     Josette Glance, MS, RDN, LDN Clinical Dietitian I Please reach out via secure chat

## 2024-05-30 NOTE — TOC Progression Note (Signed)
 Transition of Care [facility]) - Progression Note    Patient Details  Name: Thomas [name] MRN: [id number] Date of Birth: 19-Mar-1981  Transition of [facility]) CM/SW Contact  Justina Delcia Czar, RN Phone Number: [phone] 05/30/2024, 10:09 AM  Clinical Narrative:    Patient remains intubated, vasopressors, and plan to wean ECMO.   Lives at home with mother. Will need to arrange PCP at dc.   Chart reviewed for discharge readiness, patient not medically stable for d/c. Inpatient CM/CSW will continue to monitor pt's advancement through interdisciplinary progression rounds.   If new pt transition needs arise, MD please place a TOC consult.     Expected Discharge Plan: Home/Self Care Barriers to Discharge: Continued Medical Work up    Expected Discharge Plan and Services   Discharge Planning Services: CM Consult   Living arrangements for the past 2 months: Apartment                    Social Drivers of Health (SDOH) Interventions SDOH Screenings   Food Insecurity: Patient Declined (05/27/2024)  Housing: Low Risk  (05/27/2024)  Transportation Needs: No Transportation Needs (05/27/2024)  Utilities: Not At Risk (05/27/2024)  Depression (PHQ2-9): Low Risk  (04/30/2024)  Social Connections: Unknown (12/06/2021)   Received from [facility]  Tobacco Use: Medium Risk (05/27/2024)    Readmission Risk Interventions     No data to display

## 2024-05-30 NOTE — Plan of Care (Signed)
  Problem: Clinical Measurements: Goal: Will remain free from infection Outcome: Progressing Goal: Diagnostic test results will improve Outcome: Progressing Goal: Cardiovascular complication will be avoided Outcome: Progressing   Problem: Activity: Goal: Risk for activity intolerance will decrease Outcome: Progressing   Problem: Coping: Goal: Level of anxiety will decrease Outcome: Progressing   Problem: Pain Managment: Goal: General experience of comfort will improve and/or be controlled Outcome: Progressing   Problem: Safety: Goal: Ability to remain free from injury will improve Outcome: Progressing   Problem: Skin Integrity: Goal: Risk for impaired skin integrity will decrease Outcome: Progressing

## 2024-05-30 NOTE — CV Procedure (Signed)
 ECMO NOTE:   Indication: Post-cardiotomy shock   Initial cannulation date: 05/28/24   ECMO type: Central VA ECMO   Dual lumen inflow/return cannula:   1) Central ascending 20FR Ao return 2) Central 29 FR RA triple-stage drainage    Daily data:   Flow 2.45 RPM 2450 Sweep  1L   Labs:   ABG    Component Value Date/Time   PHART 7.386 05/30/2024 0948   PCO2ART 32.3 05/30/2024 0948   PO2ART 72 (L) 05/30/2024 0948   HCO3 19.4 (L) 05/30/2024 0948   TCO2 20 (L) 05/30/2024 0948   ACIDBASEDEF 5.0 (H) 05/30/2024 0948   O2SAT 94 05/30/2024 0948    Hgb 7.3 Platelets 68K LDH 205 PTT  Holding bivalrudin currently Lactic acid 0.5   Plan:  Continue ECMO support with impella 5.5 Weaned flows to 2L, tolerated well, on minimal pressors Decannulate this afternoon Continue milrinone 0.375, low dose vaso and NE Chest PT following    Morene JINNY Brownie, MD  10:37 AM

## 2024-05-30 NOTE — Transfer of Care (Signed)
 Immediate Anesthesia Transfer of Care Note  Patient: Thomas [name]  Procedure(s) Performed: HOLLIE FOR  ECMO (EXTRACORPOREAL MEMBRANE OXYGENATION) (Chest)  Patient Location: SICU  Anesthesia Type:General  Level of Consciousness: sedated and Patient remains intubated per anesthesia plan  Airway & Oxygen Therapy: Patient remains intubated per anesthesia plan and Patient placed on Ventilator (see vital sign flow sheet for setting)  Post-op Assessment: Report given to RN and Post -op Vital signs reviewed and stable  Post vital signs: Reviewed and stable  Last Vitals:  Vitals Value Taken Time  BP    Temp    Pulse 109 05/30/24 17:01  Resp 0 05/30/24 17:01  SpO2 93 % 05/30/24 17:01  Vitals shown include unfiled device data.  Last Pain:  Vitals:   05/30/24 1011  TempSrc: Core  PainSc:          Complications: No notable events documented.

## 2024-05-30 NOTE — Progress Notes (Signed)
 Advanced Heart Failure Rounding Note  Cardiologist: None  Chief Complaint: STEMI Subjective:    11/5 [facility] for failure to wean after cardiac bypass 11/6 Impella 5.5 placement  Mucus plug in the R lung after return from the OR. S/p bronch with significantly improved lung fields today, will need chest PT.  Turned down ECMO flows to 2L this morning and increased Impella to P6. After 15 minutes gas was obtained with good PaO2, normal pH.   Plan to decannulate around 1pm today. Discussed plan with CTS and CCM at bedside today.    Objective:   Weight Range: 93.7 kg Body mass index is 27.25 kg/m.   Vital Signs:   Temp:  [96.4 F (35.8 C)-98.2 F (36.8 C)] 98.1 F (36.7 C) (11/07 1015) Pulse Rate:  [78-132] 80 (11/07 1015) Resp:  [0-26] 13 (11/07 1015) BP: (84-109)/(66-84) 87/66 (11/07 0840) SpO2:  [90 %-100 %] 100 % (11/07 1015) Arterial Line BP: (74-146)/(46-98) 92/69 (11/07 1015) FiO2 (%):  [50 %-90 %] 90 % (11/07 1000) Weight:  [93.7 kg] 93.7 kg (11/07 0500) Last BM Date :  (PTA)  Weight change: Filed Weights   05/27/24 0728 05/29/24 0511 05/30/24 0500  Weight: 94.8 kg 96.8 kg 93.7 kg    Intake/Output:   Intake/Output Summary (Last 24 hours) at 05/30/2024 1029 Last data filed at 05/30/2024 1011 Gross per 24 hour  Intake 4029.45 ml  Output 3635 ml  Net 394.45 ml      Physical Exam    GENERAL: Intubated, sedated PULM:  Ventilated breath sounds CARDIAC:  GCE:qoju RRR, [facility] ECM[facility], chest tubes with minimal drainage ABDOMEN: Soft, non-tender, non-distended. NEUROLOGIC: Sedated, nods head to voice   Telemetry   Sinus rhythm  Medications:     Scheduled Medications:  sodium chloride    Intravenous Once   sodium chloride    Intravenous Once   sodium chloride    Intravenous Once   acetaminophen   1,000 mg Oral Q6H   Or   acetaminophen  (TYLENOL ) oral liquid 160 mg/5 mL  1,000 mg Per Tube Q6H   aspirin  EC  325 mg Oral Daily   Or   aspirin    324 mg Per Tube Daily   atorvastatin   40 mg Oral Daily   bisacodyl   10 mg Oral Daily   Or   bisacodyl   10 mg Rectal Daily   Chlorhexidine  Gluconate Cloth  6 each Topical Daily   docusate  100 mg Per Tube BID   fentaNYL  (SUBLIMAZE ) injection  25-50 mcg Intravenous Once   mouth rinse  15 mL Mouth Rinse Q2H   pantoprazole  (PROTONIX ) IV  40 mg Intravenous QHS   polyethylene glycol  17 g Per Tube Daily   sodium chloride  flush  10-40 mL Intracatheter Q12H   sodium chloride  flush  3 mL Intravenous Q12H    Infusions:  albumin human Stopped (05/29/24 2021)   cefTRIAXone  (ROCEPHIN )  IV Stopped (05/29/24 1935)   epinephrine  Stopped (05/29/24 9187)   fentaNYL  infusion INTRAVENOUS 200 mcg/hr (05/30/24 1000)   insulin  0.8 Units/hr (05/30/24 1000)   milrinone 0.375 mcg/kg/min (05/30/24 1000)   norepinephrine  (LEVOPHED ) Adult infusion 4 mcg/min (05/30/24 1000)   propofol  (DIPRIVAN ) infusion 70 mcg/kg/min (05/30/24 1000)   sodium bicarbonate  25 mEq (Impella PURGE) in dextrose  5 % 1000 mL bag     vancomycin  Stopped (05/29/24 1414)   vasopressin  0.04 Units/min (05/30/24 1000)    PRN Medications: albumin human, dextrose , fentaNYL , midazolam  PF, morphine  injection, ondansetron  (ZOFRAN ) IV, mouth rinse, oxyCODONE , sodium  chloride flush, sodium chloride  flush, traMADol    Patient Profile   Thomas [name] is a 43 y.o. male with a PMH of CAD s/p PCI to the OM1 in 2017, chronic angina, right nephrectomy, CKD stage IIIa, HIV, hodgkin's lymphoma treated with chemotherapy who presents with STEMI. Course complicated by post cardiotomy shock and subsequent central cannulation for ECMO.  Assessment/Plan   Post cardiotomy shock:  - Cannulated 11/5 for central ECMO - EF in the lab (unloaded) around 25-30% with anterior and inferior akinesis - IABP removed 11/5 - Impella 5.5 placed 11/6 - Flows weaned today to 2L, tolerated well, on minimal pressor support - Decannulate today to Impella 5.5 alone -  Wean vent afterwards - Continue milrinone at 0.375 today, wean NE as tolerated, keep vaso - bival off with OR today - Can assess need for anticoagluation for impella tomorrow AM - Continue broad spectrum abx with ecmo and open chest, can likely deescalate tomorrow  CAD/STEMI: Initial EKG with lateral STEMI, changes and chest pain improved in the ED but given dynamic changes taking to the cath lab. - S/p POBA to the ostial LAD - Poor stent landing zone, multivessel CAD, reduced EF - Poor CABG targets, s/p LIMA-D1, SVG-OM - Aspirin /plavix off with ecmo, will need at discharge - Statin on hold with shock  Acute systolic heart failure: - Due to ischemia - Management of shock as above  HIV:  - Continue home meds  AKI on CKD: Prior right nephrectomy due to complications from kidney stone, chronic hydronephrosis. Creatinine improving with support - due to shock, ATN - Euvolemic, no diuresis  CRITICAL CARE Performed by: Morene JINNY Brownie   Total critical care time: 45 minutes  Critical care time was exclusive of separately billable procedures and treating other patients.  Critical care was necessary to treat or prevent imminent or life-threatening deterioration.  Critical care was time spent personally by me on the following activities: development of treatment plan with patient and/or surrogate as well as nursing, discussions with consultants, evaluation of patient's response to treatment, examination of patient, obtaining history from patient or surrogate, ordering and performing treatments and interventions, ordering and review of laboratory studies, ordering and review of radiographic studies, pulse oximetry and re-evaluation of patient's condition.

## 2024-05-30 NOTE — Anesthesia Preprocedure Evaluation (Signed)
 Anesthesia Evaluation  Patient identified by MRN, date of birth, ID band Patient unresponsive    Reviewed: Allergy & Precautions, NPO status , Patient's Chart, lab work & pertinent test results  History of Anesthesia Complications Negative for: history of anesthetic complications  Airway Mallampati: Intubated       Dental   Pulmonary former smoker      + intubated    Cardiovascular + angina  + Past MI and +CHF   Rhythm:Regular     Neuro/Psych negative neurological ROS  negative psych ROS   GI/Hepatic negative GI ROS, Neg liver ROS,,,  Endo/Other  negative endocrine ROS    Renal/GU Renal disease     Musculoskeletal negative musculoskeletal ROS (+)    Abdominal   Peds  Hematology  (+) Blood dyscrasia, anemia , HIV  Anesthesia Other Findings   Reproductive/Obstetrics                              Anesthesia Physical Anesthesia Plan  ASA: 4  Anesthesia Plan: General   Post-op Pain Management:    Induction: Inhalational  PONV Risk Score and Plan: 2 and Ondansetron   Airway Management Planned: Oral ETT  Additional Equipment:   Intra-op Plan:   Post-operative Plan: Post-operative intubation/ventilation  Informed Consent: I have reviewed the patients History and Physical, chart, labs and discussed the procedure including the risks, benefits and alternatives for the proposed anesthesia with the patient or authorized representative who has indicated his/her understanding and acceptance.     History available from chart only and Consent reviewed with POA  Plan Discussed with: CRNA  Anesthesia Plan Comments: (No additional lines planned. )         Anesthesia Quick Evaluation

## 2024-05-30 NOTE — Op Note (Signed)
      [address] [place] [address]             [phone]                                     05/30/2024 Patient:  Thomas [name] Pre-Op Dx: cardiogenic shock   Post-op Dx:  same Procedure: ECMO decannulation      Surgeon and Role:      * Shyrl Linnie KIDD, MD - Primary      Anesthesia  general EBL:  Blood Administration: 1unit pRBC    Counts: correct   Indications: 43yo male s/p CABG complicated by cardiogenic shock.  He was centrally cannulated intra-op.  Yesterday, a 5.5 impella was placed.  He is ready for ECMO decannulation.  Findings: Good function on P5.  Operative Technique: All invasive lines were placed in pre-op holding.  After the risks, benefits and alternatives were thoroughly discussed, the patient was brought to the operative theatre.  Anesthesia was induced, and the patient was prepped and draped in normal sterile fashion.  An appropriate surgical pause was performed, and pre-operative antibiotics were dosed accordingly.  Hemostasis was obtained, and we separated from ECMO without event.  The heart was decannulated.  Chest tubes and wires were placed, and the sternum was re-approximated with sternal wires.  The soft tissue and skin were re-approximated wth absorbable suture.    The patient tolerated the procedure without any immediate complications, and was transferred to the ICU in guarded condition.  Sha Burling KIDD Shyrl

## 2024-05-30 NOTE — Plan of Care (Signed)
  Problem: Education: Goal: Knowledge of General Education information will improve Description: Including pain rating scale, medication(s)/side effects and non-pharmacologic comfort measures Outcome: Progressing   Problem: Health Behavior/Discharge Planning: Goal: Ability to manage health-related needs will improve Outcome: Progressing   Problem: Clinical Measurements: Goal: Ability to maintain clinical measurements within normal limits will improve Outcome: Progressing Goal: Will remain free from infection Outcome: Progressing Goal: Diagnostic test results will improve Outcome: Progressing Goal: Respiratory complications will improve Outcome: Progressing Goal: Cardiovascular complication will be avoided Outcome: Progressing   Problem: Activity: Goal: Risk for activity intolerance will decrease Outcome: Progressing   Problem: Nutrition: Goal: Adequate nutrition will be maintained Outcome: Progressing   Problem: Coping: Goal: Level of anxiety will decrease Outcome: Progressing   Problem: Elimination: Goal: Will not experience complications related to bowel motility Outcome: Progressing Goal: Will not experience complications related to urinary retention Outcome: Progressing   Problem: Pain Managment: Goal: General experience of comfort will improve and/or be controlled Outcome: Progressing   Problem: Safety: Goal: Ability to remain free from injury will improve Outcome: Progressing   Problem: Skin Integrity: Goal: Risk for impaired skin integrity will decrease Outcome: Progressing   Problem: Education: Goal: Understanding of CV disease, CV risk reduction, and recovery process will improve Outcome: Progressing Goal: Individualized Educational Video(s) Outcome: Progressing   Problem: Activity: Goal: Ability to return to baseline activity level will improve Outcome: Progressing   Problem: Cardiovascular: Goal: Ability to achieve and maintain adequate  cardiovascular perfusion will improve Outcome: Progressing Goal: Vascular access site(s) Level 0-1 will be maintained Outcome: Progressing   Problem: Health Behavior/Discharge Planning: Goal: Ability to safely manage health-related needs after discharge will improve Outcome: Progressing   Problem: Cardiac: Goal: Ability to achieve and maintain adequate cardiopulmonary perfusion will improve Outcome: Progressing Goal: Vascular access site(s) Level 0-1 will be maintained Outcome: Progressing   Problem: Fluid Volume: Goal: Ability to achieve a balanced intake and output will improve Outcome: Progressing   Problem: Physical Regulation: Goal: Complications related to the disease process, condition or treatment will be avoided or minimized Outcome: Progressing   Problem: Respiratory: Goal: Will regain and/or maintain adequate ventilation Outcome: Progressing   Problem: Education: Goal: Will demonstrate proper wound care and an understanding of methods to prevent future damage Outcome: Progressing Goal: Knowledge of disease or condition will improve Outcome: Progressing Goal: Knowledge of the prescribed therapeutic regimen will improve Outcome: Progressing Goal: Individualized Educational Video(s) Outcome: Progressing   Problem: Activity: Goal: Risk for activity intolerance will decrease Outcome: Progressing   Problem: Cardiac: Goal: Will achieve and/or maintain hemodynamic stability Outcome: Progressing   Problem: Clinical Measurements: Goal: Postoperative complications will be avoided or minimized Outcome: Progressing   Problem: Respiratory: Goal: Respiratory status will improve Outcome: Progressing   Problem: Skin Integrity: Goal: Wound healing without signs and symptoms of infection Outcome: Progressing Goal: Risk for impaired skin integrity will decrease Outcome: Progressing   Problem: Urinary Elimination: Goal: Ability to achieve and maintain adequate renal  perfusion and functioning will improve Outcome: Progressing

## 2024-05-30 NOTE — Discharge Instructions (Signed)
 Discharge Instructions:  1. You may shower, please wash incisions daily with soap and water and keep dry.  If you wish to cover wounds with dressing you may do so but please keep clean and change daily.  No tub baths or swimming until incisions have completely healed.  If your incisions become red or develop any drainage please call our office at [phone]  2. No Driving until cleared by Dr. Lang office and you are no longer using narcotic pain medications  3. Monitor your weight daily.. Please use the same scale and weigh at same time... If you gain 5-10 lbs in 48 hours with associated lower extremity swelling, please contact our office at [phone]  4. Fever of 101.5 for at least 24 hours with no source, please contact our office at [phone]  5. Activity- up as tolerated, please walk at least 3 times per day.  Avoid strenuous activity, no lifting, pushing, or pulling with your arms over 8-10 lbs for a minimum of 6 weeks  6. If any questions or concerns arise, please do not hesitate to contact our office at [phone]

## 2024-05-31 ENCOUNTER — Inpatient Hospital Stay (HOSPITAL_COMMUNITY)

## 2024-05-31 ENCOUNTER — Other Ambulatory Visit: Payer: Self-pay

## 2024-05-31 DIAGNOSIS — I5021 Acute systolic (congestive) heart failure: Secondary | ICD-10-CM

## 2024-05-31 DIAGNOSIS — N1831 Chronic kidney disease, stage 3a: Secondary | ICD-10-CM | POA: Diagnosis not present

## 2024-05-31 DIAGNOSIS — R570 Cardiogenic shock: Secondary | ICD-10-CM | POA: Diagnosis not present

## 2024-05-31 DIAGNOSIS — R739 Hyperglycemia, unspecified: Secondary | ICD-10-CM | POA: Diagnosis not present

## 2024-05-31 DIAGNOSIS — I2102 ST elevation (STEMI) myocardial infarction involving left anterior descending coronary artery: Secondary | ICD-10-CM | POA: Diagnosis not present

## 2024-05-31 LAB — POCT I-STAT 7, (LYTES, BLD GAS, ICA,H+H)
Acid-base deficit: 8 mmol/L — ABNORMAL HIGH (ref 0.0–2.0)
Acid-base deficit: 6 mmol/L — ABNORMAL HIGH (ref 0.0–2.0)
Bicarbonate: 16.8 mmol/L — ABNORMAL LOW (ref 20.0–28.0)
Bicarbonate: 18.5 mmol/L — ABNORMAL LOW (ref 20.0–28.0)
Calcium, Ion: 1.04 mmol/L — ABNORMAL LOW (ref 1.15–1.40)
Calcium, Ion: 1.05 mmol/L — ABNORMAL LOW (ref 1.15–1.40)
HCT: 20 % — ABNORMAL LOW (ref 39.0–52.0)
HCT: 24 % — ABNORMAL LOW (ref 39.0–52.0)
Hemoglobin: 6.8 g/dL — CL (ref 13.0–17.0)
Hemoglobin: 8.2 g/dL — ABNORMAL LOW (ref 13.0–17.0)
O2 Saturation: 100 %
O2 Saturation: 94 %
Patient temperature: 36.4
Patient temperature: 37.5
Potassium: 3.6 mmol/L (ref 3.5–5.1)
Potassium: 3.8 mmol/L (ref 3.5–5.1)
Sodium: 139 mmol/L (ref 135–145)
Sodium: 136 mmol/L (ref 135–145)
TCO2: 18 mmol/L — ABNORMAL LOW (ref 22–32)
TCO2: 19 mmol/L — ABNORMAL LOW (ref 22–32)
pCO2 arterial: 28.7 mmHg — ABNORMAL LOW (ref 32–48)
pCO2 arterial: 31.9 mmHg — ABNORMAL LOW (ref 32–48)
pH, Arterial: 7.373 (ref 7.35–7.45)
pH, Arterial: 7.374 (ref 7.35–7.45)
pO2, Arterial: 257 mmHg — ABNORMAL HIGH (ref 83–108)
pO2, Arterial: 75 mmHg — ABNORMAL LOW (ref 83–108)

## 2024-05-31 LAB — BASIC METABOLIC PANEL WITH GFR
Anion gap: 8 (ref 5–15)
Anion gap: 13 (ref 5–15)
BUN: 8 mg/dL (ref 6–20)
BUN: 9 mg/dL (ref 6–20)
CO2: 20 mmol/L — ABNORMAL LOW (ref 22–32)
CO2: 19 mmol/L — ABNORMAL LOW (ref 22–32)
Calcium: 7.6 mg/dL — ABNORMAL LOW (ref 8.9–10.3)
Calcium: 7.7 mg/dL — ABNORMAL LOW (ref 8.9–10.3)
Chloride: 107 mmol/L (ref 98–111)
Chloride: 101 mmol/L (ref 98–111)
Creatinine, Ser: 1.45 mg/dL — ABNORMAL HIGH (ref 0.61–1.24)
Creatinine, Ser: 1.49 mg/dL — ABNORMAL HIGH (ref 0.61–1.24)
GFR, Estimated: >60 mL/min (ref >60)
GFR, Estimated: >60 mL/min (ref >60)
Glucose, Bld: 133 mg/dL — ABNORMAL HIGH (ref 70–99)
Glucose, Bld: 149 mg/dL — ABNORMAL HIGH (ref 70–99)
Potassium: 4 mmol/L (ref 3.5–5.1)
Potassium: 4.1 mmol/L (ref 3.5–5.1)
Sodium: 135 mmol/L (ref 135–145)
Sodium: 133 mmol/L — ABNORMAL LOW (ref 135–145)

## 2024-05-31 LAB — GLUCOSE, CAPILLARY
Glucose-Capillary: 120 mg/dL — ABNORMAL HIGH (ref 70–99)
Glucose-Capillary: 125 mg/dL — ABNORMAL HIGH (ref 70–99)
Glucose-Capillary: 113 mg/dL — ABNORMAL HIGH (ref 70–99)
Glucose-Capillary: 132 mg/dL — ABNORMAL HIGH (ref 70–99)
Glucose-Capillary: 89 mg/dL (ref 70–99)
Glucose-Capillary: 122 mg/dL — ABNORMAL HIGH (ref 70–99)
Glucose-Capillary: 135 mg/dL — ABNORMAL HIGH (ref 70–99)
Glucose-Capillary: 137 mg/dL — ABNORMAL HIGH (ref 70–99)
Glucose-Capillary: 124 mg/dL — ABNORMAL HIGH (ref 70–99)
Glucose-Capillary: 143 mg/dL — ABNORMAL HIGH (ref 70–99)
Glucose-Capillary: 157 mg/dL — ABNORMAL HIGH (ref 70–99)
Glucose-Capillary: 135 mg/dL — ABNORMAL HIGH (ref 70–99)
Glucose-Capillary: 119 mg/dL — ABNORMAL HIGH (ref 70–99)

## 2024-05-31 LAB — CBC
HCT: 24.2 % — ABNORMAL LOW (ref 39.0–52.0)
HCT: 26.1 % — ABNORMAL LOW (ref 39.0–52.0)
Hemoglobin: 8.4 g/dL — ABNORMAL LOW (ref 13.0–17.0)
Hemoglobin: 8.9 g/dL — ABNORMAL LOW (ref 13.0–17.0)
MCH: 32.1 pg (ref 26.0–34.0)
MCH: 31.8 pg (ref 26.0–34.0)
MCHC: 34.7 g/dL (ref 30.0–36.0)
MCHC: 34.1 g/dL (ref 30.0–36.0)
MCV: 92.4 fL (ref 80.0–100.0)
MCV: 93.2 fL (ref 80.0–100.0)
Platelets: 70 K/uL — ABNORMAL LOW (ref 150–400)
Platelets: 90 K/uL — ABNORMAL LOW (ref 150–400)
RBC: 2.62 MIL/uL — ABNORMAL LOW (ref 4.22–5.81)
RBC: 2.8 MIL/uL — ABNORMAL LOW (ref 4.22–5.81)
RDW: 16.2 % — ABNORMAL HIGH (ref 11.5–15.5)
RDW: 15.9 % — ABNORMAL HIGH (ref 11.5–15.5)
WBC: 8.9 K/uL (ref 4.0–10.5)
WBC: 11.9 K/uL — ABNORMAL HIGH (ref 4.0–10.5)
nRBC: 0 % (ref 0.0–0.2)
nRBC: 0 % (ref 0.0–0.2)

## 2024-05-31 LAB — MAGNESIUM
Magnesium: 1.9 mg/dL (ref 1.7–2.4)
Magnesium: 2.2 mg/dL (ref 1.7–2.4)

## 2024-05-31 LAB — FIBRINOGEN: Fibrinogen: 713 mg/dL — ABNORMAL HIGH (ref 210–475)

## 2024-05-31 LAB — HEPATIC FUNCTION PANEL
ALT: 9 U/L (ref 0–44)
AST: 17 U/L (ref 15–41)
Albumin: 2.6 g/dL — ABNORMAL LOW (ref 3.5–5.0)
Alkaline Phosphatase: 25 U/L — ABNORMAL LOW (ref 38–126)
Bilirubin, Direct: 0.7 mg/dL — ABNORMAL HIGH (ref 0.0–0.2)
Indirect Bilirubin: 0.4 mg/dL (ref 0.3–0.9)
Total Bilirubin: 1.1 mg/dL (ref 0.0–1.2)
Total Protein: 4.9 g/dL — ABNORMAL LOW (ref 6.5–8.1)

## 2024-05-31 LAB — ECHOCARDIOGRAM LIMITED
Height: 73 in
Weight: 3312.19 [oz_av]

## 2024-05-31 LAB — MRSA NEXT GEN BY PCR, NASAL: MRSA by PCR Next Gen: NOT DETECTED

## 2024-05-31 LAB — PROTIME-INR
INR: 1.2 (ref 0.8–1.2)
Prothrombin Time: 16.3 s — ABNORMAL HIGH (ref 11.4–15.2)

## 2024-05-31 LAB — LACTATE DEHYDROGENASE: LDH: 206 U/L — ABNORMAL HIGH (ref 98–192)

## 2024-05-31 LAB — CG4 I-STAT (LACTIC ACID): Lactic Acid, Venous: 0.8 mmol/L (ref 0.5–1.9)

## 2024-05-31 MED ORDER — FUROSEMIDE 10 MG/ML IJ SOLN
80.0000 mg | Freq: Two times a day (BID) | INTRAMUSCULAR | Status: AC
  Administered 2024-05-31: 80 mg via INTRAVENOUS

## 2024-05-31 MED ORDER — SODIUM CHLORIDE 0.9% FLUSH
10.0000 mL | Freq: Two times a day (BID) | INTRAVENOUS | Status: DC
  Administered 2024-05-31: 20 mL
  Administered 2024-05-31: 30 mL
  Administered 2024-06-01: 10 mL
  Administered 2024-06-01: 40 mL
  Administered 2024-06-02 – 2024-06-03 (×3): 10 mL

## 2024-05-31 MED ORDER — MAGNESIUM SULFATE 2 GM/50ML IV SOLN
2.0000 g | Freq: Once | INTRAVENOUS | Status: AC
  Administered 2024-05-31: 2 g via INTRAVENOUS
  Filled 2024-05-31: qty 50

## 2024-05-31 MED ORDER — VASOPRESSIN 20 UNITS/100 ML INFUSION FOR SHOCK
0.0000 [IU]/min | INTRAVENOUS | Status: DC
  Administered 2024-05-31 – 2024-06-01 (×4): 0.04 [IU]/min via INTRAVENOUS
  Filled 2024-05-31 (×2): qty 100

## 2024-05-31 MED ORDER — SODIUM CHLORIDE 0.9% FLUSH: 10.0000 mL | INTRAVENOUS | Status: DC | PRN

## 2024-05-31 MED ORDER — FUROSEMIDE 10 MG/ML IJ SOLN
INTRAMUSCULAR | Status: AC
  Filled 2024-05-31: qty 8

## 2024-05-31 MED ORDER — SODIUM CHLORIDE 0.9 % IV SOLN: INTRAVENOUS | Status: AC | PRN

## 2024-05-31 MED ORDER — POTASSIUM CHLORIDE 20 MEQ PO PACK
60.0000 meq | PACK | Freq: Once | ORAL | Status: AC
  Administered 2024-05-31: 60 meq
  Filled 2024-05-31: qty 3

## 2024-05-31 MED ORDER — SODIUM CHLORIDE 3 % IN NEBU
4.0000 mL | INHALATION_SOLUTION | Freq: Every day | RESPIRATORY_TRACT | Status: AC
  Administered 2024-05-31 – 2024-06-02 (×3): 4 mL via RESPIRATORY_TRACT
  Filled 2024-05-31 (×3): qty 4

## 2024-05-31 MED ORDER — METOCLOPRAMIDE HCL 5 MG/ML IJ SOLN
10.0000 mg | Freq: Three times a day (TID) | INTRAMUSCULAR | Status: DC
  Administered 2024-05-31 – 2024-06-02 (×7): 10 mg via INTRAVENOUS
  Filled 2024-05-31 (×8): qty 2

## 2024-05-31 MED ORDER — PIPERACILLIN-TAZOBACTAM 3.375 G IVPB
3.3750 g | Freq: Three times a day (TID) | INTRAVENOUS | Status: DC
  Administered 2024-05-31 – 2024-06-04 (×13): 3.375 g via INTRAVENOUS
  Filled 2024-05-31 (×13): qty 50

## 2024-05-31 MED ORDER — HEPARIN (PORCINE) 25000 UT/250ML-% IV SOLN
500.0000 [IU]/h | INTRAVENOUS | Status: DC
  Administered 2024-05-31 – 2024-06-02 (×2): 500 [IU]/h via INTRAVENOUS
  Filled 2024-05-31 (×2): qty 250

## 2024-05-31 NOTE — Progress Notes (Signed)
   [address], [place] [address]             [phone]   Intubated, sedated  BP 109/65   Pulse (!) 119   Temp 99.5 F (37.5 C)   Resp 18   Ht 6' 1 (1.854 m)   Wt 93.9 kg   SpO2 99%   BMI 27.31 kg/m  29/16 Ci 3.1 CVP 7 Epi 7.5, norepi 9, vasopressin  0.04, milrinone 0.375 Impella p6 3.9 L/min   Intake/Output Summary (Last 24 hours) at 05/31/2024 1814 Last data filed at 05/31/2024 1800 Gross per 24 hour  Intake 4220.09 ml  Output 7297 ml  Net -3076.91 ml   3.5L negative last 12 hours  Elspeth C. Kerrin, MD [facility] [phone]

## 2024-05-31 NOTE — Progress Notes (Signed)
 NAME:  THEON SOBOTKA, MRN:  [id number], DOB:  [birth year], LOS: 4 ADMISSION DATE:  05/27/2024, CONSULTATION DATE:  05/28/2024 REFERRING MD:  Dr. Shyrl, CHIEF COMPLAINT:  s/p CABG, post-op vent management  History of Present Illness:  Mr. Mckeone is a 43 year old male with history of CAD c/b cardiac arrest s/p PCI to OM1 in 2017, s/p right nephrectomy, CKD stage IIIa, HIV (on HAART), Hogdkin's lymphoma (s/p chemo now in remission) who was admitted 05/27/2024 with chest pain and found to have lateral STEMI. He underwent LHC which showed 20% distal LM, 99% ostial LAD, 30% mid LCx, and 30% proximal RCA disease with likely chronically occluded mid LAD and OM1 stent. He underwent successful PTCA to ostial LAD and IABP placement for stabilization. TCTS was consulted for consideration of CABG.  On 11/5 he underwent CABGx2 (LIMA-diag, SVG-OM) with Dr. Shyrl. Intra-op course notable for vasoplegia and cardioplegia with inability to come off bypass due to hypotension. Patient [facility]ed to [facility] via RA and aortic cannulation and was transferred to [facility] ICU for further management.    Intra-op he received 710 mL cell saver, 2 PRBC and 1.5L LR. UOP 575 mL. Aortic cross clamp time 53 minutes, CPB time 119 minutes. TEE showed normal RV, LV dysfunction. Pre-op EF 30-35% on epi and IABP.   Pertinent  Medical History  Per above  Significant Hospital Events: Including procedures, antibiotic start and stop dates in addition to other pertinent events   11/4 admitted for lateral STEMI underwent successful PTCA to ostial LAD  11/5 CABG x 2 (LIMA>Lcx, SVG> OM) complicated by cardiogenic shock requiring central ECMO cannulation to come off bypass.  11/6 impella 5.5 placed, bronch for mucus plugging entire R lung 11/7 decannulated from ECMO, increased inotropes. Mucus plug entire R lung> bronch  Interim History / Subjective:  Vent not weaned overnight Milrinone 0.375 NE 7 Epi 3 Vaso 0.04 He denies  complaints.   Objective    Blood pressure (!) 87/66, pulse 85, temperature (!) 97.5 F (36.4 C), resp. rate 18, height 6' 1 (1.854 m), weight 93.9 kg, SpO2 100%. PAP: (18-48)/(9-23) 40/20 CVP:  [7 mmHg-16 mmHg] 10 mmHg CO:  [5.4 L/min-7 L/min] 5.4 L/min CI:  [2.5 L/min/m2-3.2 L/min/m2] 2.5 L/min/m2  Vent Mode: PRVC FiO2 (%):  [80 %-90 %] 80 % Set Rate:  [15 bmp-18 bmp] 18 bmp Vt Set:  [600 mL-630 mL] 630 mL PEEP:  [10 cmH20] 10 cmH20 Plateau Pressure:  [21 cmH20] 21 cmH20   Intake/Output Summary (Last 24 hours) at 05/31/2024 0739 Last data filed at 05/31/2024 0700 Gross per 24 hour  Intake 4320.86 ml  Output 2544 ml  Net 1776.86 ml   Filed Weights   05/29/24 0511 05/30/24 0500 05/31/24 0418  Weight: 96.8 kg 93.7 kg 93.9 kg    Examination: General: critically ill appearing man lying in bed in NAD HENT: [place]/AT, eyes anicteric, etT Lungs: breathing comfortably on MV, decreased R base. CTA anteriorly. Minimal ETT secretions Cardiovascular: S1S2, RRR. Sternal dressing c/d/i Abdomen: soft, NT Extremities: no edema, R femoral venous sheath. R axillary impella Neuro: RASS -2, moving some, but not regularly following commands. Nods y/n GU: foley with yellow urine   Resp culture: 11/6 rare WBC Resp culture 11/7 rare PMNs>    I/O+1.8L, net +3L for admission Chest tube 124cc  BUN 8 Cr 1.45 WBC 8.9 H/H 8.4/24.2 Platelets 70 LDH 206 Fibrinogen  713  CXR personally reviewed> R lung reopened but still has opacification RLL  Resolved problem  list   Assessment and Plan   Acute post-bypass cardiogenic shock requiring [facility] ECM[facility]  CAD s/p CABG x2 (LIMA-diag, SVG-OM) Acute post-bypass vasoplegia Acute lateral STEMI s/p PCI and IABP  -Post-op care per TCTS -increase impella flows, wean NE  & vaso first.  -con't milrinone and epi -Echo today -start heparin  today -aspirin , statin   Post-operative mechanical ventilation  Mucus plugging -LTVV; FiO2 & PEEP weaned to  facilitate vent weaning -vAP prevention protocol -PAD protocol for sedation -daily SAT & SBT -follow cultures -zosyn , MRSA nares  Hyperglycemia -transition to basal bolus insulin  today  Ileus -NGT once extubated -reglan  AKI on CKD stage IIIa S/p right nephrectomy (04/30/2014) for nonresolving nephrolithiasis -strict I/O -renally dose meds, avoid nephrotoxic meds  Expected post-operative acute blood loss anemia Expected post-operative consumptive thrombocytopenia  -monitor, no need for transfusion  History of HIV; has had undetectable VL since 2019 -On cebenuva at home; not due to next injection yet  D/w AHF. Hopeful for extubation later today.   Labs   CBC: Recent Labs  Lab 05/27/24 0740 05/27/24 0743 05/27/24 1139 05/28/24 0243 05/29/24 1846 05/29/24 1858 05/30/24 0449 05/30/24 0456 05/30/24 1322 05/30/24 1758 05/30/24 1852 05/31/24 0403 05/31/24 0628  WBC 5.2  --  5.6   < > 7.2  --  6.7  --  6.4 9.4  --  8.9  --   NEUTROABS 2.7  --  3.1  --   --   --   --   --   --   --   --   --   --   HGB 13.8   < > 14.6   < > 8.9*   < > 7.3*   < > 7.2* 8.6* 8.5* 8.4* 6.8*  HCT 40.7   < > 44.5   < > 26.2*   < > 21.5*   < > 21.1* 24.9* 25.0* 24.2* 20.0*  MCV 98.5  --  100.7*   < > 97.0  --  97.3  --  96.3 94.3  --  92.4  --   PLT 187  --  204   < > 60*  --  68*  --  59* 62*  --  70*  --    < > = values in this interval not displayed.    Basic Metabolic Panel: Recent Labs  Lab 05/29/24 1303 05/29/24 1535 05/29/24 1701 05/29/24 1858 05/29/24 1940 05/29/24 2139 05/30/24 0449 05/30/24 0456 05/30/24 0948 05/30/24 1758 05/30/24 1852 05/31/24 0403 05/31/24 0628  NA 140  143   < > 144   < > 141   < > 139   < > 140 134* 140 135 139  K 4.3  4.1   < > 3.6   < > 4.0   < > 4.0   < > 3.8 4.0 4.2 4.0 3.6  CL 111  --  111  --  110  --  107  --   --  105  --  107  --   CO2 22  --   --   --  21*  --  20*  --   --  20*  --  20*  --   GLUCOSE 105*  --  94  --  121*  --  124*   --   --  166*  --  133*  --   BUN 9  --  8  --  7  --  8  --   --  7  --  8  --   CREATININE 1.51*  --  1.20  --  1.53*  --  1.40*  --   --  1.32*  --  1.45*  --   CALCIUM  7.2*  --   --   --  7.3*  --  7.6*  --   --  7.5*  --  7.6*  --   MG 2.1  --   --   --  2.0  --  1.9  --   --  2.0  --  1.9  --   PHOS  --   --   --   --   --   --  3.0  --   --   --   --   --   --    < > = values in this interval not displayed.   GFR: Estimated Creatinine Clearance: 75.8 mL/min (A) (by C-G formula based on SCr of 1.45 mg/dL (H)). Recent Labs  Lab 05/29/24 1846 05/30/24 0449 05/30/24 0502 05/30/24 1322 05/30/24 1758 05/30/24 1907 05/31/24 0403 05/31/24 0634  WBC 7.2 6.7  --  6.4 9.4  --  8.9  --   LATICACIDVEN 0.8  --  0.5  --   --  1.1  --  0.8    Critical care time:     This patient is critically ill with multiple organ system failure which requires frequent high complexity decision making, assessment, support, evaluation, and titration of therapies. This was completed through the application of advanced monitoring technologies and extensive interpretation of multiple databases. During this encounter critical care time was devoted to patient care services described in this note for 41 minutes.  Leita SHAUNNA Gaskins, DO 05/31/24 9:01 AM [facility]  For contact information, see Amion. If no response to pager, please call PCCM consult pager. After hours, 7PM- 7AM, please call Elink.

## 2024-05-31 NOTE — Progress Notes (Addendum)
 RT note. Patient placed on SBT at this time per MD, RT will continue to monitor.    05/31/24 0940  Adult Ventilator Settings  Vent Type Servo i  Humidity HME  Vent Mode (S)  PSV;CPAP  FiO2 (%) 40 %  Pressure Support 5 cmH20  PEEP 8 cmH20  Adult Ventilator Measurements  Peak Airway Pressure 14 L/min  Mean Airway Pressure 10 cmH20  Resp Rate Spontaneous 19 br/min  Resp Rate Total 19 br/min  Spont TV 550 mL  Measured Ve 10.4 L  Auto PEEP 0 cmH20  Total PEEP 8 cmH20  SpO2 99 %  Adult Ventilator Alarms  Alarms On Y    1036 Patient placed back on PRVC at this time due to ^ WOB. Will try again later.

## 2024-05-31 NOTE — Progress Notes (Signed)
 PHARMACY - ANTICOAGULATION CONSULT NOTE  Pharmacy Consult for heparin  Indication: Impella 5.5   Allergies  Allergen Reactions   Tylenol  Drayton.donate ] Other (See Comments)    Cold sweats    Patient Measurements: Height: 6' 1 (185.4 cm) Weight: 93.9 kg (207 lb 0.2 oz) IBW/kg (Calculated) : 79.9 HEPARIN  DW (KG): 94.8  Vital Signs: Temp: 99.5 F (37.5 C) (11/08 1130) Temp Source: Core (11/08 0400) BP: 109/65 (11/08 1257) Pulse Rate: 117 (11/08 1257)  Labs: Recent Labs    05/28/24 1528 05/28/24 1532 05/29/24 0500 05/29/24 1303 05/29/24 1846 05/29/24 1858 05/30/24 0449 05/30/24 0456 05/30/24 1322 05/30/24 1758 05/30/24 1852 05/31/24 0403 05/31/24 0628  HGB 11.2*   < > 8.7*   < > 8.9*   < > 7.3*   < > 7.2* 8.6* 8.5* 8.4* 6.8*  HCT 33.6*   < > 24.7*   < > 26.2*   < > 21.5*   < > 21.1* 24.9* 25.0* 24.2* 20.0*  PLT 90*   < > 75*   < > 60*  --  68*  --  59* 62*  --  70*  --   APTT 32  --   --   --  39*  --  33  --   --   --   --   --   --   LABPROT 18.3*  --  17.8*  --   --   --  16.5*  --   --   --   --  16.3*  --   INR 1.4*  --  1.4*  --   --   --  1.3*  --   --   --   --  1.2  --   CREATININE  --    < > 2.18*   < >  --    < > 1.40*  --   --  1.32*  --  1.45*  --    < > = values in this interval not displayed.    Estimated Creatinine Clearance: 75.8 mL/min (A) (by C-G formula based on SCr of 1.45 mg/dL (H)).    Assessment: 43yo male presented with chest pain and found to have STEMI. IABP placed 11/4>CABG 11/5 unstable coming off pump > central ECMO cannulation > impella 5.5 placed 11/6> decannulated 11/7.  Hgb 8 LFTs wnl, bibrinogen slightly elevated 700 LDH stable 200 Impella 5.5 P4>P7 with bicarb purge solution Will start heparin  drip 500 uts/hr to prevent pump thrombosis   Goal of Therapy:  Heparin  level 0.2-0.5 units/ml Monitor platelets by anticoagulation protocol: Yes  Plan:  Heparin  drip 500 uts/hr  Daily heparin  level and cbc  Monitor s/s bleeding     Olam Chalk Pharm.D. CPP, BCPS Clinical Pharmacist [phone] 05/31/2024 1:15 PM

## 2024-05-31 NOTE — Progress Notes (Signed)
 Echocardiogram 2D Echocardiogram limited has been performed.  Caralyn Twining N Mong Neal,RDCS 05/31/2024, 1:38 PM

## 2024-05-31 NOTE — Progress Notes (Signed)
 Pharmacy Antibiotic Note  Thomas [name] is a 43 y.o. male admitted on 05/27/2024 with open chest + ECMO cannulation.  Pharmacy has been consulted for ceftriaxone  and vancomycin  dosing.  Patient now decannulated and chest closed but spike fever 99 and rigors and Cxr consolidation right base - expand antibiotics Cr 1.4   Plan: Change ceftriaxone  > to zosyn  3.375gm IV q8h EI Vancomycin  1500 mg IV q 24 hrs.    Height: 6' 1 (185.4 cm) Weight: 93.9 kg (207 lb 0.2 oz) IBW/kg (Calculated) : 79.9  Temp (24hrs), Avg:97.7 F (36.5 C), Min:96.4 F (35.8 C), Max:99.9 F (37.7 C)  Recent Labs  Lab 05/29/24 0705 05/29/24 0813 05/29/24 1303 05/29/24 1701 05/29/24 1846 05/29/24 1940 05/30/24 0449 05/30/24 0502 05/30/24 1322 05/30/24 1758 05/30/24 1907 05/31/24 0403 05/31/24 0634  WBC  --   --    < >  --  7.2  --  6.7  --  6.4 9.4  --  8.9  --   CREATININE  --   --    < > 1.20  --  1.53* 1.40*  --   --  1.32*  --  1.45*  --   LATICACIDVEN 0.7  --   --   --  0.8  --   --  0.5  --   --  1.1  --  0.8  VANCORANDOM  --  13  --   --   --   --   --   --   --   --   --   --   --    < > = values in this interval not displayed.    Estimated Creatinine Clearance: 75.8 mL/min (A) (by C-G formula based on SCr of 1.45 mg/dL (H)).    Allergies  Allergen Reactions   Tylenol  [Acetaminophen ] Other (See Comments)    Cold sweats      Olam Chalk Pharm.D. CPP, BCPS Clinical Pharmacist [phone] 05/31/2024 1:08 PM

## 2024-05-31 NOTE — Progress Notes (Signed)
 Peripherally Inserted Central Catheter Placement  The IV Nurse has discussed with the patient and/or persons authorized to consent for the patient, the purpose of this procedure and the potential benefits and risks involved with this procedure.  The benefits include less needle sticks, lab draws from the catheter, and the patient may be discharged home with the catheter. Risks include, but not limited to, infection, bleeding, blood clot (thrombus formation), and puncture of an artery; nerve damage and irregular heartbeat and possibility to perform a PICC exchange if needed/ordered by physician.  Alternatives to this procedure were also discussed.  Bard Power PICC patient education guide, fact sheet on infection prevention and patient information card has been provided to patient /or left at bedside. Obtained telephone consent from mother.   PICC Placement Documentation  PICC Triple Lumen 05/31/24 Left Brachial 44 cm 1 cm (Active)  Indication for Insertion or Continuance of Line Vasoactive infusions 05/31/24 1139  Exposed Catheter (cm) 1 cm 05/31/24 1139  Site Assessment Clean, Dry, Intact 05/31/24 1139  Lumen #1 Status Flushed;Saline locked;Blood return noted 05/31/24 1139  Lumen #2 Status Flushed;Saline locked;Blood return noted 05/31/24 1139  Lumen #3 Status Flushed;Saline locked;Blood return noted 05/31/24 1139  Dressing Type Transparent;Securing device 05/31/24 1139  Dressing Status Antimicrobial disc/dressing in place;Clean, Dry, Intact 05/31/24 1139  Line Care Connections checked and tightened 05/31/24 1139  Line Adjustment (NICU/IV Team Only) No 05/31/24 1139  Dressing Intervention New dressing;Adhesive placed at insertion site (IV team only) 05/31/24 1139  Dressing Change Due 06/07/24 05/31/24 1139       Myran Arcia Sheral Ruth 05/31/2024, 11:41 AM

## 2024-05-31 NOTE — Progress Notes (Addendum)
 [address] [place] [address]             [phone]      1 Day Post-Op Procedure(s) (LRB): DECANNULATION FOR  ECMO (EXTRACORPOREAL MEMBRANE OXYGENATION) (N/A) Subjective: Patient opens eyes and follows commands when spoken to  Objective: Vital signs in last 24 hours: Temp:  [96.4 F (35.8 C)-99.9 F (37.7 C)] 99.5 F (37.5 C) (11/08 1130) Pulse Rate:  [74-139] 121 (11/08 1130) Cardiac Rhythm: Normal sinus rhythm (11/08 0800) Resp:  [0-23] 17 (11/08 1130) SpO2:  [93 %-100 %] 97 % (11/08 1130) Arterial Line BP: (78-146)/(48-74) 94/60 (11/08 1130) FiO2 (%):  [40 %-80 %] 40 % (11/08 1036) Weight:  [93.9 kg] 93.9 kg (11/08 0418)  Hemodynamic parameters for last 24 hours: PAP: (21-56)/(11-25) 22/14 CVP:  [4 mmHg-16 mmHg] 4 mmHg CO:  [5.4 L/min-6.9 L/min] 5.7 L/min CI:  [2.5 L/min/m2-3.2 L/min/m2] 2.63 L/min/m2  Intake/Output from previous day: 11/07 0701 - 11/08 0700 In: 4320.9 [I.V.:2894.1; Blood:670; NG/GT:100; IV Piggyback:450.3] Out: 2544 [Urine:1820; Emesis/NG output:500; Blood:100; Chest Tube:124] Intake/Output this shift: Total I/O In: 528.1 [I.V.:416.8; Other:36.1; IV Piggyback:75.2] Out: 2165 [Urine:1965; Emesis/NG output:200]  General appearance: critically ill, intubated Neurologic: intact, follows commands Heart: sinus tachycardia, no murmur Lungs: Diminished bibasilar breath sounds Abdomen: No bowel sounds, nontender, no distension Extremities: edema 1+ BLE  Wound: Clean and dry dressings in place  Lab Results: Recent Labs    05/30/24 1758 05/30/24 1852 05/31/24 0403 05/31/24 0628  WBC 9.4  --  8.9  --   HGB 8.6*   < > 8.4* 6.8*  HCT 24.9*   < > 24.2* 20.0*  PLT 62*  --  70*  --    < > = values in this interval not displayed.   BMET:  Recent Labs    05/30/24 1758 05/30/24 1852 05/31/24 0403 05/31/24 0628  NA 134*   < > 135 139  K 4.0   < > 4.0 3.6  CL 105  --  107  --   CO2 20*  --  20*  --   GLUCOSE  166*  --  133*  --   BUN 7  --  8  --   CREATININE 1.32*  --  1.45*  --   CALCIUM  7.5*  --  7.6*  --    < > = values in this interval not displayed.    PT/INR:  Recent Labs    05/31/24 0403  LABPROT 16.3*  INR 1.2   ABG    Component Value Date/Time   PHART 7.373 05/31/2024 0628   HCO3 16.8 (L) 05/31/2024 0628   TCO2 18 (L) 05/31/2024 0628   ACIDBASEDEF 8.0 (H) 05/31/2024 0628   O2SAT 100 05/31/2024 0628   CBG (last 3)  Recent Labs    05/31/24 0601 05/31/24 0801 05/31/24 1005  GLUCAP 113* 132* 89    Assessment/Plan: S/P Procedure(s) (LRB): DECANNULATION FOR  ECMO (EXTRACORPOREAL MEMBRANE OXYGENATION) (N/A)  Neuro: Weaning sedation as tolerated. Follows commands, neurologically intact.   CV: ECMO decannulation yesterday. Impella 5.5 in place at P6, flow 3.6. On, Vasopressin  0.04, milrinone 0.375, and epinephrine  5.5. Weaned off Norepi and weaning Vasopressin  today. Plan for echo today and starting heparin . S/p STEMI, will start plavix prior to discharge. ST, HR 120s. EPW in place, box is off. MAP 68. CI 2.63.   Pulm: Intubated, weaning vent, hoping to extubate today but did not tolerate wean this AM. CT  output 124cc/24hrs serosanguinous. CXR with bibasilar atelectasis and small right pleural effusion. May be able to d/c chest tubes soon.   GI: Postop ileus, NGT will be placed once extubated. No bowel sounds on exam. Continue reglan.   Endo: Preop A1C 5.2. CBGs controlled on insulin  drip  Renal: AKI on CKD stage IIIa, s/p nephrectomy 2015. Cr 1.45, slightly bumped from 1.32 yesterday. UO 1820cc/24hrs, net positive. Per AHF team starting diuresis IV Lasix 80mg  BID. Has already had about 2L off since first dose was given.  ID: On empiric Zosyn  and vancomycin . No leukocytosis, Tmax 99.9.   Expected postop ABLA: H/H 8.4/24.2, stable. Not clinically significant at this time.   DVT Prophylaxis: On heparin   Hx of HIV: On Cebenuva at home, next injection not due yet.  Undetectable viral load.   Dispo: Continue ICU care   LOS: 4 days    Con GORMAN Bend, PA-C 05/31/2024 Patient seen and examined, d/w CCM and AHF team Did not tolerate SBT Continue diuresis  Elspeth C. Kerrin, MD [facility] [phone]

## 2024-05-31 NOTE — Progress Notes (Signed)
 Patient ID: Thomas [name], male   DOB: [birth year], 43 y.o.   MRN: [id number]     Advanced Heart Failure Rounding Note  Cardiologist: None  Chief Complaint: STEMI Subjective:    11/5 Central [facility] ECMO for failure to wean after cardiac bypass 11/6 Impella 5.5 placement 11/7 ECMO decannulated  Suspect PNA, currently on ceftriaxone .  WBCs 8.9.   He is on milrinone 0.375, NE 6, epinephrine  3, vasopressin  0.04.   Weaning sedation, trying to wake him up.   Impella 5.5 P4 Flow 2.4  LDH 206  [place] CVP 15 PA 44/22 CI 2.63 Lactate 0.8  Objective:   Weight Range: 93.9 kg Body mass index is 27.31 kg/m.   Vital Signs:   Temp:  [96.4 F (35.8 C)-98.4 F (36.9 C)] 98.4 F (36.9 C) (11/08 0830) Pulse Rate:  [74-109] 95 (11/08 0830) Resp:  [0-21] 18 (11/08 0830) SpO2:  [93 %-100 %] 100 % (11/08 0830) Arterial Line BP: (80-146)/(54-74) 119/60 (11/08 0830) FiO2 (%):  [70 %-90 %] 70 % (11/08 0715) Weight:  [93.9 kg] 93.9 kg (11/08 0418) Last BM Date :  (PTA)  Weight change: Filed Weights   05/29/24 0511 05/30/24 0500 05/31/24 0418  Weight: 96.8 kg 93.7 kg 93.9 kg    Intake/Output:   Intake/Output Summary (Last 24 hours) at 05/31/2024 0847 Last data filed at 05/31/2024 0800 Gross per 24 hour  Intake 4370.62 ml  Output 2584 ml  Net 1786.62 ml      Physical Exam    General: On vent, eyes open Neck: JVP 14-16 cm, no thyromegaly or thyroid  nodule.  Lungs: Decreased at bases.  CV: Nondisplaced PMI.  Heart tachy, regular S1/S2, no S3/S4, no murmur.  Trace ankle edema. Abdomen: Soft, nontender, no hepatosplenomegaly, no distention.  Skin: Intact without lesions or rashes.  Neurologic: Awake but not following commands.  Extremities: No clubbing or cyanosis.  HEENT: Normal.   Telemetry   Sinus tachycardia (personally reviewed)  Medications:     Scheduled Medications:  sodium chloride    Intravenous Once   sodium chloride    Intravenous Once   sodium chloride     Intravenous Once   acetaminophen   1,000 mg Oral Q6H   Or   acetaminophen  (TYLENOL ) oral liquid 160 mg/5 mL  1,000 mg Per Tube Q6H   aspirin  EC  325 mg Oral Daily   Or   aspirin   324 mg Per Tube Daily   atorvastatin   40 mg Oral Daily   bisacodyl   10 mg Oral Daily   Or   bisacodyl   10 mg Rectal Daily   Chlorhexidine  Gluconate Cloth  6 each Topical Daily   docusate  100 mg Per Tube BID   fentaNYL  (SUBLIMAZE ) injection  25-50 mcg Intravenous Once   mouth rinse  15 mL Mouth Rinse Q2H   pantoprazole  (PROTONIX ) IV  40 mg Intravenous QHS   polyethylene glycol  17 g Per Tube Daily   sodium chloride  flush  10-40 mL Intracatheter Q12H   sodium chloride  flush  3 mL Intravenous Q12H   sodium chloride  HYPERTONIC  4 mL Nebulization Daily    Infusions:  albumin human Stopped (05/29/24 2021)   epinephrine  3 mcg/min (05/31/24 0800)   fentaNYL  infusion INTRAVENOUS 200 mcg/hr (05/31/24 0800)   insulin  0.3 Units/hr (05/31/24 0800)   milrinone 0.375 mcg/kg/min (05/31/24 0800)   norepinephrine  (LEVOPHED ) Adult infusion 6 mcg/min (05/31/24 0800)   propofol  (DIPRIVAN ) infusion 70 mcg/kg/min (05/31/24 0800)   sodium bicarbonate  25 mEq (Impella PURGE) in dextrose  5 %  1000 mL bag     vancomycin  Stopped (05/30/24 1410)   vasopressin  0.04 Units/min (05/31/24 0800)    PRN Medications: albumin human, dextrose , fentaNYL , midazolam  PF, morphine  injection, ondansetron  (ZOFRAN ) IV, mouth rinse, oxyCODONE , sodium chloride  flush, sodium chloride  flush, traMADol    Patient Profile   Thomas [name] is a 43 y.o. male with a PMH of CAD s/p PCI to the OM1 in 2017, chronic angina, right nephrectomy, CKD stage IIIa, HIV, hodgkin's lymphoma treated with chemotherapy who presents with STEMI. Course complicated by post cardiotomy shock and subsequent central cannulation for ECMO.  Assessment/Plan   Post cardiotomy shock:  - Cannulated 11/5 for central ECMO - EF in the lab (unloaded) around 25-30% with anterior and  inferior akinesis - IABP removed 11/5 - Impella 5.5 placed 11/6 - Central ECMO decannulated 11/7 - Currently on milrinone 0.375, NE 6, epinephrine  3, vasopressin  0.04.  - Impella at P4 => turned up to P7 with no suction and increase in flow to 3.8 L/min.  Will now work on titrating off NE and vasopressin . Will leave milrinone and epinephrine  for now.  - CVP 15, will start Lasix 80 mg IV bid.  - Start low dose heparin  gtt for Impella.   CAD/STEMI: Initial EKG with lateral STEMI, changes and chest pain improved in the ED but given dynamic changes taking to the cath lab. - S/p POBA to the ostial LAD - Poor stent landing zone, multivessel CAD, reduced EF - Poor CABG targets, now s/p LIMA-D1, SVG-OM - Continue aspirin  - Continue statin.   ID - Suspect PNA, on ceftriaxone .   HIV:  - Continue home meds  AKI on CKD: Prior right nephrectomy due to complications from kidney stone, chronic hydronephrosis. Creatinine improving with support - due to shock, ATN - Creatinine 1.45 today.  - Start diuresis.   Acute hypoxemic respiratory failure - Intubated - Diurese today as above.  - Continue current abx.  - Will try to wean sedation and extubate today.   CRITICAL CARE Performed by: Ezra Shuck   Total critical care time: 50 minutes  Critical care time was exclusive of separately billable procedures and treating other patients.  Critical care was necessary to treat or prevent imminent or life-threatening deterioration.  Critical care was time spent personally by me on the following activities: development of treatment plan with patient and/or surrogate as well as nursing, discussions with consultants, evaluation of patient's response to treatment, examination of patient, obtaining history from patient or surrogate, ordering and performing treatments and interventions, ordering and review of laboratory studies, ordering and review of radiographic studies, pulse oximetry and re-evaluation  of patient's condition.  Ezra Shuck 05/31/2024 8:59 AM

## 2024-06-01 ENCOUNTER — Inpatient Hospital Stay (HOSPITAL_COMMUNITY)

## 2024-06-01 ENCOUNTER — Encounter (HOSPITAL_COMMUNITY): Payer: Self-pay | Admitting: Thoracic Surgery (Cardiothoracic Vascular Surgery)

## 2024-06-01 DIAGNOSIS — N1831 Chronic kidney disease, stage 3a: Secondary | ICD-10-CM | POA: Diagnosis not present

## 2024-06-01 DIAGNOSIS — I2102 ST elevation (STEMI) myocardial infarction involving left anterior descending coronary artery: Secondary | ICD-10-CM | POA: Diagnosis not present

## 2024-06-01 DIAGNOSIS — I5021 Acute systolic (congestive) heart failure: Secondary | ICD-10-CM | POA: Diagnosis not present

## 2024-06-01 DIAGNOSIS — N179 Acute kidney failure, unspecified: Secondary | ICD-10-CM | POA: Diagnosis not present

## 2024-06-01 DIAGNOSIS — R739 Hyperglycemia, unspecified: Secondary | ICD-10-CM | POA: Diagnosis not present

## 2024-06-01 DIAGNOSIS — R570 Cardiogenic shock: Secondary | ICD-10-CM | POA: Diagnosis not present

## 2024-06-01 LAB — GLUCOSE, CAPILLARY
Glucose-Capillary: 107 mg/dL — ABNORMAL HIGH (ref 70–99)
Glucose-Capillary: 112 mg/dL — ABNORMAL HIGH (ref 70–99)
Glucose-Capillary: 114 mg/dL — ABNORMAL HIGH (ref 70–99)
Glucose-Capillary: 115 mg/dL — ABNORMAL HIGH (ref 70–99)
Glucose-Capillary: 119 mg/dL — ABNORMAL HIGH (ref 70–99)
Glucose-Capillary: 127 mg/dL — ABNORMAL HIGH (ref 70–99)
Glucose-Capillary: 129 mg/dL — ABNORMAL HIGH (ref 70–99)
Glucose-Capillary: 136 mg/dL — ABNORMAL HIGH (ref 70–99)
Glucose-Capillary: 137 mg/dL — ABNORMAL HIGH (ref 70–99)
Glucose-Capillary: 90 mg/dL (ref 70–99)
Glucose-Capillary: 97 mg/dL (ref 70–99)

## 2024-06-01 LAB — BASIC METABOLIC PANEL WITH GFR
Anion gap: 14 (ref 5–15)
Anion gap: 17 — ABNORMAL HIGH (ref 5–15)
BUN: 8 mg/dL (ref 6–20)
BUN: 11 mg/dL (ref 6–20)
CO2: 20 mmol/L — ABNORMAL LOW (ref 22–32)
CO2: 21 mmol/L — ABNORMAL LOW (ref 22–32)
Calcium: 7.8 mg/dL — ABNORMAL LOW (ref 8.9–10.3)
Calcium: 9.1 mg/dL (ref 8.9–10.3)
Chloride: 100 mmol/L (ref 98–111)
Chloride: 102 mmol/L (ref 98–111)
Creatinine, Ser: 1.46 mg/dL — ABNORMAL HIGH (ref 0.61–1.24)
Creatinine, Ser: 1.64 mg/dL — ABNORMAL HIGH (ref 0.61–1.24)
GFR, Estimated: >60 mL/min (ref >60)
GFR, Estimated: 54 mL/min — ABNORMAL LOW (ref >60)
Glucose, Bld: 122 mg/dL — ABNORMAL HIGH (ref 70–99)
Glucose, Bld: 108 mg/dL — ABNORMAL HIGH (ref 70–99)
Potassium: 3.5 mmol/L (ref 3.5–5.1)
Potassium: 4 mmol/L (ref 3.5–5.1)
Sodium: 134 mmol/L — ABNORMAL LOW (ref 135–145)
Sodium: 140 mmol/L (ref 135–145)

## 2024-06-01 LAB — POCT I-STAT 7, (LYTES, BLD GAS, ICA,H+H)
Acid-base deficit: 3 mmol/L — ABNORMAL HIGH (ref 0.0–2.0)
Acid-base deficit: 3 mmol/L — ABNORMAL HIGH (ref 0.0–2.0)
Bicarbonate: 20.5 mmol/L (ref 20.0–28.0)
Bicarbonate: 20.7 mmol/L (ref 20.0–28.0)
Calcium, Ion: 1.11 mmol/L — ABNORMAL LOW (ref 1.15–1.40)
Calcium, Ion: 1.11 mmol/L — ABNORMAL LOW (ref 1.15–1.40)
HCT: 21 % — ABNORMAL LOW (ref 39.0–52.0)
HCT: 31 % — ABNORMAL LOW (ref 39.0–52.0)
Hemoglobin: 7.1 g/dL — ABNORMAL LOW (ref 13.0–17.0)
Hemoglobin: 10.5 g/dL — ABNORMAL LOW (ref 13.0–17.0)
O2 Saturation: 99 %
O2 Saturation: 96 %
Patient temperature: 37.1
Patient temperature: 37.5
Potassium: 3.7 mmol/L (ref 3.5–5.1)
Potassium: 3.8 mmol/L (ref 3.5–5.1)
Sodium: 135 mmol/L (ref 135–145)
Sodium: 140 mmol/L (ref 135–145)
TCO2: 21 mmol/L — ABNORMAL LOW (ref 22–32)
TCO2: 22 mmol/L (ref 22–32)
pCO2 arterial: 30.8 mmHg — ABNORMAL LOW (ref 32–48)
pCO2 arterial: 30.8 mmHg — ABNORMAL LOW (ref 32–48)
pH, Arterial: 7.433 (ref 7.35–7.45)
pH, Arterial: 7.438 (ref 7.35–7.45)
pO2, Arterial: 132 mmHg — ABNORMAL HIGH (ref 83–108)
pO2, Arterial: 78 mmHg — ABNORMAL LOW (ref 83–108)

## 2024-06-01 LAB — PROTIME-INR
INR: 1.2 (ref 0.8–1.2)
Prothrombin Time: 16 s — ABNORMAL HIGH (ref 11.4–15.2)

## 2024-06-01 LAB — HEPATIC FUNCTION PANEL
ALT: 12 U/L (ref 0–44)
AST: 23 U/L (ref 15–41)
Albumin: 2.8 g/dL — ABNORMAL LOW (ref 3.5–5.0)
Alkaline Phosphatase: 29 U/L — ABNORMAL LOW (ref 38–126)
Bilirubin, Direct: 1.2 mg/dL — ABNORMAL HIGH (ref 0.0–0.2)
Indirect Bilirubin: 0.8 mg/dL (ref 0.3–0.9)
Total Bilirubin: 2 mg/dL — ABNORMAL HIGH (ref 0.0–1.2)
Total Protein: 5.4 g/dL — ABNORMAL LOW (ref 6.5–8.1)

## 2024-06-01 LAB — PHOSPHORUS: Phosphorus: 2.7 mg/dL (ref 2.5–4.6)

## 2024-06-01 LAB — CBC
HCT: 22.4 % — ABNORMAL LOW (ref 39.0–52.0)
HCT: 29.6 % — ABNORMAL LOW (ref 39.0–52.0)
Hemoglobin: 7.6 g/dL — ABNORMAL LOW (ref 13.0–17.0)
Hemoglobin: 10 g/dL — ABNORMAL LOW (ref 13.0–17.0)
MCH: 31.7 pg (ref 26.0–34.0)
MCH: 31.5 pg (ref 26.0–34.0)
MCHC: 33.9 g/dL (ref 30.0–36.0)
MCHC: 33.8 g/dL (ref 30.0–36.0)
MCV: 93.3 fL (ref 80.0–100.0)
MCV: 93.4 fL (ref 80.0–100.0)
Platelets: 83 K/uL — ABNORMAL LOW (ref 150–400)
Platelets: 109 10*3/uL — ABNORMAL LOW (ref 150–400)
RBC: 2.4 MIL/uL — ABNORMAL LOW (ref 4.22–5.81)
RBC: 3.17 MIL/uL — ABNORMAL LOW (ref 4.22–5.81)
RDW: 15.7 % — ABNORMAL HIGH (ref 11.5–15.5)
RDW: 15.4 % (ref 11.5–15.5)
WBC: 7.7 K/uL (ref 4.0–10.5)
WBC: 10.1 10*3/uL (ref 4.0–10.5)
nRBC: 0 % (ref 0.0–0.2)
nRBC: 0 % (ref 0.0–0.2)

## 2024-06-01 LAB — COOXEMETRY PANEL
Carboxyhemoglobin: 1.2 % (ref 0.5–1.5)
Carboxyhemoglobin: 2.4 % — ABNORMAL HIGH (ref 0.5–1.5)
Carboxyhemoglobin: 2.3 % — ABNORMAL HIGH (ref 0.5–1.5)
Methemoglobin: <0.7 % (ref 0.0–1.5)
Methemoglobin: 1 % (ref 0.0–1.5)
Methemoglobin: 1.2 % (ref 0.0–1.5)
O2 Saturation: 73.5 %
O2 Saturation: 67.7 %
O2 Saturation: 66.2 %
Total hemoglobin: 8.4 g/dL — ABNORMAL LOW (ref 12.0–16.0)
Total hemoglobin: 10.2 g/dL — ABNORMAL LOW (ref 12.0–16.0)
Total hemoglobin: 11.9 g/dL — ABNORMAL LOW (ref 12.0–16.0)

## 2024-06-01 LAB — MAGNESIUM: Magnesium: 2.1 mg/dL (ref 1.7–2.4)

## 2024-06-01 LAB — TRIGLYCERIDES: Triglycerides: 213 mg/dL — ABNORMAL HIGH (ref <150)

## 2024-06-01 LAB — HEPARIN LEVEL (UNFRACTIONATED): Heparin Unfractionated: <0.1 [IU]/mL — ABNORMAL LOW (ref 0.30–0.70)

## 2024-06-01 LAB — LACTATE DEHYDROGENASE: LDH: 229 U/L — ABNORMAL HIGH (ref 98–192)

## 2024-06-01 LAB — FIBRINOGEN: Fibrinogen: 709 mg/dL — ABNORMAL HIGH (ref 210–475)

## 2024-06-01 MED ORDER — ATORVASTATIN CALCIUM 40 MG PO TABS
40.0000 mg | ORAL_TABLET | Freq: Every day | ORAL | Status: DC
  Administered 2024-06-02: 40 mg
  Filled 2024-06-01: qty 1

## 2024-06-01 MED ORDER — ORAL CARE MOUTH RINSE: 15.0000 mL | OROMUCOSAL | Status: DC | PRN

## 2024-06-01 MED ORDER — HALOPERIDOL LACTATE 5 MG/ML IJ SOLN
1.0000 mg | Freq: Four times a day (QID) | INTRAMUSCULAR | Status: DC | PRN
  Administered 2024-06-01 (×2): 1 mg via INTRAVENOUS
  Filled 2024-06-01: qty 1

## 2024-06-01 MED ORDER — FUROSEMIDE 10 MG/ML IJ SOLN
80.0000 mg | Freq: Once | INTRAMUSCULAR | Status: AC
  Administered 2024-06-01: 80 mg via INTRAVENOUS
  Filled 2024-06-01: qty 8

## 2024-06-01 MED ORDER — POTASSIUM CHLORIDE 20 MEQ PO PACK
40.0000 meq | PACK | Freq: Once | ORAL | Status: AC
  Administered 2024-06-01: 40 meq
  Filled 2024-06-01: qty 2

## 2024-06-01 MED ORDER — POTASSIUM CHLORIDE 10 MEQ/50ML IV SOLN
10.0000 meq | INTRAVENOUS | Status: AC
  Administered 2024-06-01 (×3): 10 meq via INTRAVENOUS

## 2024-06-01 MED ORDER — ALBUMIN HUMAN 5 % IV SOLN
INTRAVENOUS | Status: AC
Start: 2024-06-01 — End: 2024-06-01
  Administered 2024-06-01: 12.5 g via INTRAVENOUS
  Filled 2024-06-01: qty 250

## 2024-06-01 MED ORDER — ALBUMIN HUMAN 5 % IV SOLN: 12.5000 g | Freq: Once | INTRAVENOUS | Status: AC

## 2024-06-01 MED ORDER — INSULIN ASPART 100 UNIT/ML IJ SOLN
0.0000 [IU] | INTRAMUSCULAR | Status: DC
  Administered 2024-06-02 – 2024-06-04 (×2): 2 [IU] via SUBCUTANEOUS
  Filled 2024-06-01 (×2): qty 2

## 2024-06-01 MED ORDER — HALOPERIDOL LACTATE 5 MG/ML IJ SOLN
INTRAMUSCULAR | Status: AC
  Filled 2024-06-01: qty 1

## 2024-06-01 NOTE — Progress Notes (Signed)
 Extubation Procedure Note  Patient Details:   Name: Thomas [name] DOB: 06-25-1981 MRN: [id number]   Airway Documentation:    Vent end date: 06/01/24 Vent end time: 0828   Evaluation  O2 sats: stable throughout Complications: No apparent complications Patient did tolerate procedure well. Bilateral Breath Sounds: Clear, Diminished   Yes Patient extubated to 4 L [place] per MD order. Patient able to say name, patient with cuff leak, no stridor noted. RT will continue to monitor.    06/01/24 0828  Therapy Vitals  Temp 98.8 F (37.1 C)  Pulse Rate (!) 128  Resp (!) 28  MEWS Score/Color  MEWS Score 4  MEWS Score Color Red  Respiratory Assessment  Assessment Type Assess only  Respiratory Pattern Regular;Unlabored  Chest Assessment Chest expansion symmetrical  Cough Productive  Bilateral Breath Sounds Clear;Diminished  Oxygen Therapy/Pulse Ox  O2 Device (S)  Nasal Cannula  SpO2 100 %  O2 Therapy Oxygen  O2 Flow Rate (L/min) 4 L/min     Maryelizabeth DELENA Sprang 06/01/2024, 8:31 AM

## 2024-06-01 NOTE — Progress Notes (Signed)
 NAME:  Thomas [name], MRN:  [id number], DOB:  [birth year], LOS: 5 ADMISSION DATE:  05/27/2024, CONSULTATION DATE:  05/28/2024 REFERRING MD:  Dr. Shyrl, CHIEF COMPLAINT:  s/p CABG, post-op vent management  History of Present Illness:  Thomas [name] is a 43 year old male with history of CAD c/b cardiac arrest s/p PCI to OM1 in 2017, s/p right nephrectomy, CKD stage IIIa, HIV (on HAART), Hogdkin's lymphoma (s/p chemo now in remission) who was admitted 05/27/2024 with chest pain and found to have lateral STEMI. He underwent LHC which showed 20% distal LM, 99% ostial LAD, 30% mid LCx, and 30% proximal RCA disease with likely chronically occluded mid LAD and OM1 stent. He underwent successful PTCA to ostial LAD and IABP placement for stabilization. TCTS was consulted for consideration of CABG.  On 11/5 he underwent CABGx2 (LIMA-diag, SVG-OM) with Dr. Shyrl. Intra-op course notable for vasoplegia and cardioplegia with inability to come off bypass due to hypotension. Patient transitioned to central [facility] ECMO via RA and aortic cannulation and was transferred to [facility] ICU for further management.    Intra-op he received 710 mL cell saver, 2 PRBC and 1.5L LR. UOP 575 mL. Aortic cross clamp time 53 minutes, CPB time 119 minutes. TEE showed normal RV, LV dysfunction. Pre-op EF 30-35% on epi and IABP.   Pertinent  Medical History  Per above  Significant Hospital Events: Including procedures, antibiotic start and stop dates in addition to other pertinent events   11/4 admitted for lateral STEMI underwent successful PTCA to ostial LAD  11/5 CABG x 2 (LIMA>Lcx, SVG> OM) complicated by cardiogenic shock requiring central ECMO cannulation to come off bypass.  11/6 impella 5.5 placed, bronch for mucus plugging entire R lung 11/7 decannulated from ECMO, increased inotropes. Mucus plug entire R lung> bronch 11/8 failed SBT 11/9 extubated; still high NGT output  Interim History / Subjective:  Overnight no acute  events. Wants to be extubated.   Objective    Blood pressure 109/65, pulse (!) 109, temperature 99.1 F (37.3 C), resp. rate 18, height 6' 1 (1.854 m), weight 93 kg, SpO2 98%. PAP: (22-60)/(11-30) 30/18 CVP:  [3 mmHg-17 mmHg] 10 mmHg CO:  [5.2 L/min-8.2 L/min] 6.4 L/min CI:  [2.4 L/min/m2-3.7 L/min/m2] 3 L/min/m2  Vent Mode: PRVC FiO2 (%):  [40 %] 40 % Set Rate:  [18 bmp] 18 bmp Vt Set:  [630 mL] 630 mL PEEP:  [8 cmH20] 8 cmH20 Pressure Support:  [5 cmH20] 5 cmH20 Plateau Pressure:  [18 cmH20-23 cmH20] 18 cmH20   Intake/Output Summary (Last 24 hours) at 06/01/2024 0721 Last data filed at 06/01/2024 0700 Gross per 24 hour  Intake 5122.05 ml  Output 6918 ml  Net -1795.95 ml   Filed Weights   05/30/24 0500 05/31/24 0418 06/01/24 0604  Weight: 93.7 kg 93.9 kg 93 kg    Examination: General: ill appearing man lying in bed in NAD HENT: [name]/AT, eyes anicteric, ETT Lungs: breathing comfortably on MV, tolerating 8/5. CTAB. Cardiovascular: S1S2, RRR. Impella at P7 Abdomen: soft, NT Extremities: no significant edema, R axillary impella Neuro: RASS 0, following commands GU: foley with yellow urine   Resp culture: 11/6 rare WBC> NGTD Resp culture 11/7 rare PMNs>    I/O+1.8L, net +3L for admission Chest tube 124cc  Na+ 134 BUN 8 Cr 1.46 WBC 7.7 H/H 7.6/22.4 Platelets 83 LDH 229 Fibrinogen  709  CXR personally reviewed> ETT 6 cm above carina, consolidated RLL. Impella, swan, chest tubes in place.   Resolved problem list  AKI  Assessment and Plan   Acute post-bypass cardiogenic shock requiring central [facility] ECMO > decannulated 11/7 CAD s/p CABG x2 (LIMA-diag, SVG-OM) Acute post-bypass vasoplegia Acute lateral STEMI s/p PCI and IABP  -Post-op care per TCTS -impella at P7; heparin  -Con't milrinone, epi; weaned off vaso, NE this morning.  -aspirin , statin once able to take PO meds -monitor electrolytes, replete as needed -lasix 80mg  this morning  Post-operative  mechanical ventilation  Mucus plugging-- RLL still consolidated, possible pneumonia -LTVV -VAP prevention protocol -PAD protocol -SAT & SBT- passed, extubated to [place] -pulmonary hygiene -7 days of zosyn   Hyperglycemia -transition to SSI off insulin  gtt -goal BG <180  Ileus -NGT placed -NPO  CKD stage IIIa S/p right nephrectomy (04/30/2014) for nonresolving nephrolithiasis -strict I/O -renally dose meds, avoid nephrotoxic meds -try to get foley out tomorrow  Expected post-operative acute blood loss anemia Expected post-operative consumptive thrombocytopenia > not improving yet -transfuse for Hb <7 or hemodynamically significant bleeding -watch platelets closely with heparin  and impella -trend -repeat afternoon CBC  Deconditioning -PT, OT -progressive mobility  History of HIV; has had undetectable VL since 2019 -On cebenuva at home; not due to next injection yet  D/w AHF during rounds.   Labs   CBC: Recent Labs  Lab 05/27/24 0740 05/27/24 0743 05/27/24 1139 05/28/24 0243 05/30/24 1322 05/30/24 1758 05/30/24 1852 05/31/24 0403 05/31/24 0628 05/31/24 1521 05/31/24 1752 06/01/24 0414 06/01/24 0423  WBC 5.2  --  5.6   < > 6.4 9.4  --  8.9  --   --  11.9*  --  7.7  NEUTROABS 2.7  --  3.1  --   --   --   --   --   --   --   --   --   --   HGB 13.8   < > 14.6   < > 7.2* 8.6*   < > 8.4* 6.8* 8.2* 8.9* 7.1* 7.6*  HCT 40.7   < > 44.5   < > 21.1* 24.9*   < > 24.2* 20.0* 24.0* 26.1* 21.0* 22.4*  MCV 98.5  --  100.7*   < > 96.3 94.3  --  92.4  --   --  93.2  --  93.3  PLT 187  --  204   < > 59* 62*  --  70*  --   --  90*  --  83*   < > = values in this interval not displayed.    Basic Metabolic Panel: Recent Labs  Lab 05/30/24 0449 05/30/24 0456 05/30/24 1758 05/30/24 1852 05/31/24 0403 05/31/24 0628 05/31/24 1521 05/31/24 1730 06/01/24 0414 06/01/24 0423  NA 139   < > 134*   < > 135 139 136 133* 135 134*  K 4.0   < > 4.0   < > 4.0 3.6 3.8 4.1 3.7 3.5  CL  107  --  105  --  107  --   --  101  --  100  CO2 20*  --  20*  --  20*  --   --  19*  --  20*  GLUCOSE 124*  --  166*  --  133*  --   --  149*  --  122*  BUN 8  --  7  --  8  --   --  9  --  8  CREATININE 1.40*  --  1.32*  --  1.45*  --   --  1.49*  --  1.46*  CALCIUM  7.6*  --  7.5*  --  7.6*  --   --  7.7*  --  7.8*  MG 1.9  --  2.0  --  1.9  --   --  2.2  --  2.1  PHOS 3.0  --   --   --   --   --   --   --   --  2.7   < > = values in this interval not displayed.   GFR: Estimated Creatinine Clearance: 75.2 mL/min (A) (by C-G formula based on SCr of 1.46 mg/dL (H)). Recent Labs  Lab 05/29/24 1846 05/30/24 0449 05/30/24 0502 05/30/24 1322 05/30/24 1758 05/30/24 1907 05/31/24 0403 05/31/24 0634 05/31/24 1752 06/01/24 0423  WBC 7.2   < >  --    < > 9.4  --  8.9  --  11.9* 7.7  LATICACIDVEN 0.8  --  0.5  --   --  1.1  --  0.8  --   --    < > = values in this interval not displayed.    Critical care time:     This patient is critically ill with multiple organ system failure which requires frequent high complexity decision making, assessment, support, evaluation, and titration of therapies. This was completed through the application of advanced monitoring technologies and extensive interpretation of multiple databases. During this encounter critical care time was devoted to patient care services described in this note for 45 minutes.  Leita SHAUNNA Gaskins, DO 06/01/24 2:01 PM [facility]  For contact information, see Amion. If no response to pager, please call PCCM consult pager. After hours, 7PM- 7AM, please call Elink.

## 2024-06-01 NOTE — Progress Notes (Signed)
 2 Days Post-Op Procedure(s) (LRB): DECANNULATION FOR  ECMO (EXTRACORPOREAL MEMBRANE OXYGENATION) (N/A) Subjective: Just extubated Some incisional pain  Objective: Vital signs in last 24 hours: Temp:  [98.6 F (37 C)-99.9 F (37.7 C)] 98.8 F (37.1 C) (11/09 0828) Pulse Rate:  [97-142] 128 (11/09 0828) Cardiac Rhythm: Sinus tachycardia (11/09 0800) Resp:  [0-28] 28 (11/09 0828) BP: (109)/(65) 109/65 (11/08 1257) SpO2:  [93 %-100 %] 100 % (11/09 0828) Arterial Line BP: (66-291)/(43-286) 146/73 (11/09 0815) FiO2 (%):  [40 %] 40 % (11/09 0743) Weight:  [93 kg] 93 kg (11/09 0604)  Hemodynamic parameters for last 24 hours: PAP: (22-60)/(11-30) 34/19 CVP:  [3 mmHg-17 mmHg] 11 mmHg CO:  [5.2 L/min-8.2 L/min] 6.4 L/min CI:  [2.4 L/min/m2-3.7 L/min/m2] 3 L/min/m2  Intake/Output from previous day: 11/08 0701 - 11/09 0700 In: 5122.1 [I.V.:3329.2; NG/GT:200; IV Piggyback:1355.2] Out: 3081 [Urine:6060; Emesis/NG output:650; Chest Tube:208] Intake/Output this shift: Total I/O In: 234.1 [I.V.:103.9; Other:9.1; IV Piggyback:121.1] Out: -   General appearance: cooperative and fatigued Neurologic: no focal weakness Heart: tachy, regular Lungs: diminished breath sounds right base Abdomen: normal findings: soft, non-tender Wound: dressing clean  Lab Results: Recent Labs    05/31/24 1752 06/01/24 0414 06/01/24 0423  WBC 11.9*  --  7.7  HGB 8.9* 7.1* 7.6*  HCT 26.1* 21.0* 22.4*  PLT 90*  --  83*   BMET:  Recent Labs    05/31/24 1730 06/01/24 0414 06/01/24 0423  NA 133* 135 134*  K 4.1 3.7 3.5  CL 101  --  100  CO2 19*  --  20*  GLUCOSE 149*  --  122*  BUN 9  --  8  CREATININE 1.49*  --  1.46*  CALCIUM  7.7*  --  7.8*    PT/INR:  Recent Labs    06/01/24 0423  LABPROT 16.0*  INR 1.2   ABG    Component Value Date/Time   PHART 7.433 06/01/2024 0414   HCO3 20.5 06/01/2024 0414   TCO2 21 (L) 06/01/2024 0414   ACIDBASEDEF 3.0 (H) 06/01/2024 0414   O2SAT 73.5  06/01/2024 0423   CBG (last 3)  Recent Labs    06/01/24 0211 06/01/24 0412 06/01/24 0608  GLUCAP 136* 137* 114*    Assessment/Plan: S/P Procedure(s) (LRB): DECANNULATION FOR  ECMO (EXTRACORPOREAL MEMBRANE OXYGENATION) (N/A) POD # 4/2 NEURO- no focal deficit CV- in sinus tachy with good hemodynamics  Normal PA pressure  Impella 5.5 p6 4 L/min flow, CI 3.5, co-ox 73  Off norepi., vasopressin  0.03, epi 5, milrinone 0.375  ASA, statin  Dc pacing wires RESP- just extubated  CXR shows RLL atelectasis- IS, pulmonary hygiene RENAL- creatinine stable at 1.46  6L UO yesterday but only 1.8 L negative  Continue diuresis ENDO- CBG well controlled with insulin  drip  Can transition to SSI GI- advance diet as tolerated Anemia secondary to ABL- hgb 7.6 down from 8.9- monitor SCD for DVT prophylaxis- also on heparin  for Impella Dc chest tubes   LOS: 5 days    Thomas [name] 06/01/2024

## 2024-06-01 NOTE — Progress Notes (Signed)
 RT note. CPT held at this time due to patient unable to tolerate, ^ HR & BP.  Coupious amount of secretions suction at this time from mouth and ETT,   06/01/24 0715  Vent Select  Invasive or Noninvasive Invasive  Adult Vent Y  Airway 8 mm  Placement Date/Time: 05/28/24 (c) 0909   Grade View: Grade 1  Airway Device: Endotracheal Tube  Laryngoscope Blade: Miller;2  ETT Types: Oral  Size (mm): 8 mm  Cuffed: Min.occ.pres.  Insertion attempts: 1  Airway Equipment: Stylet  Placement Confirmat...  Secured at (cm) (S)  28 cm (ETT found at 24, pushed back to 28, patient achieving columes and sat 99%)  Measured From Lips  Secured Location Left  Secured By English As A Second Language Teacher No  Tube Holder Repositioned Yes  Prone position No  Cuff Pressure (cm H2O) Clear OR 27-39 CmH2O  Site Condition Drainage (Comment) (oral secretions)  Adult Ventilator Settings  Vent Type Servo i  Humidity HME  Vent Mode PRVC  Vt Set 630 mL  Set Rate 18 bmp  FiO2 (%) 40 %  I Time 0.8 Sec(s)  PEEP 8 cmH20  Adult Ventilator Measurements  Peak Airway Pressure 24 L/min  Mean Airway Pressure 14 cmH20  Plateau Pressure 18 cmH20  Resp Rate Spontaneous 0 br/min  Resp Rate Total 18 br/min  Exhaled Vt 583 mL  Measured Ve 9.7 L  I:E Ratio Measured 1:3.1  Auto PEEP 0 cmH20  Total PEEP 8 cmH20  SpO2 98 %  Adult Ventilator Alarms  Alarms On Y  Ve High Alarm 20 L/min  Ve Low Alarm 4 L/min  Resp Rate High Alarm 40 br/min  Resp Rate Low Alarm 8  PEEP Low Alarm 6 cmH2O  Press High Alarm 45 cmH2O  T Apnea 20 sec(s)  VAP Prevention  HME changed Yes  HOB> 30 Degrees Y  Equipment wiped down Yes  Daily Weaning Assessment  Daily Assessment of Readiness to Wean Wean protocol criteria not met  Reason not met  (HR^)  Breath Sounds  Bilateral Breath Sounds Rhonchi  R Upper  Breath Sounds Rhonchi  L Upper Breath Sounds Rhonchi  R Lower Breath Sounds Diminished  L Lower Breath Sounds Diminished  Airway  Suctioning/Secretions  Suction Type ETT  Suction Device  Catheter  Secretion Amount Copious  Secretion Color Tan;Pink tinged  Secretion Consistency Frothy;Thick  Suction Tolerance Tolerated fairly well  Suctioning Adverse Effects Anxiety;Tachycardia

## 2024-06-01 NOTE — Progress Notes (Signed)
 Advanced Heart Failure Rounding Note  Cardiologist: None  Chief Complaint: STEMI Subjective:    11/5 [facility] ECMO for failure to wean after cardiac bypass 11/6 Impella 5.5 placement 11/7 ECMO decannulation  Extubated today, good cough. Still waking up, but in good spirits given his clinical course. Thick secretions but mobilizing well.   Continue IV diuresis today, CVP in the 12-13 range.    Objective:   Weight Range: 93 kg Body mass index is 27.05 kg/m.   Vital Signs:   Temp:  [98.4 F (36.9 C)-99.9 F (37.7 C)] 99.3 F (37.4 C) (11/09 1430) Pulse Rate:  [97-145] 145 (11/09 1430) Resp:  [14-33] 23 (11/09 1430) SpO2:  [94 %-100 %] 98 % (11/09 1430) Arterial Line BP: (66-291)/(43-286) 127/72 (11/09 1430) FiO2 (%):  [40 %] 40 % (11/09 0743) Weight:  [93 kg] 93 kg (11/09 0604) Last BM Date :  (PTA)  Weight change: Filed Weights   05/30/24 0500 05/31/24 0418 06/01/24 0604  Weight: 93.7 kg 93.9 kg 93 kg    Intake/Output:   Intake/Output Summary (Last 24 hours) at 06/01/2024 1452 Last data filed at 06/01/2024 1400 Gross per 24 hour  Intake 5316.47 ml  Output 8045 ml  Net -2728.53 ml      Physical Exam    GENERAL: Extubated, cough PULM:  Cough, diminished on the right CARDIAC:  GCE:fpoiob elevated R axillary impella, tachycardia ABDOMEN: Soft, non-tender, non-distended. NEUROLOGIC: alert and oriented x3  Medications:     Scheduled Medications:  sodium chloride    Intravenous Once   sodium chloride    Intravenous Once   sodium chloride    Intravenous Once   acetaminophen   1,000 mg Oral Q6H   Or   acetaminophen  (TYLENOL ) oral liquid 160 mg/5 mL  1,000 mg Per Tube Q6H   aspirin  EC  325 mg Oral Daily   Or   aspirin   324 mg Per Tube Daily   [START ON 06/02/2024] atorvastatin   40 mg Per Tube Daily   bisacodyl   10 mg Oral Daily   Or   bisacodyl   10 mg Rectal Daily   Chlorhexidine  Gluconate Cloth  6 each Topical Daily   docusate  100 mg Per Tube BID    fentaNYL  (SUBLIMAZE ) injection  25-50 mcg Intravenous Once   insulin  aspart  0-24 Units Subcutaneous Q4H   metoCLOPramide (REGLAN) injection  10 mg Intravenous Q8H   pantoprazole  (PROTONIX ) IV  40 mg Intravenous QHS   polyethylene glycol  17 g Per Tube Daily   sodium chloride  flush  10-40 mL Intracatheter Q12H   sodium chloride  flush  10-40 mL Intracatheter Q12H   sodium chloride  flush  3 mL Intravenous Q12H   sodium chloride  HYPERTONIC  4 mL Nebulization Daily    Infusions:  albumin human 60 mL/hr at 06/01/24 1400   epinephrine  3 mcg/min (06/01/24 1400)   heparin  500 Units/hr (06/01/24 1400)   milrinone 0.375 mcg/kg/min (06/01/24 1400)   piperacillin -tazobactam (ZOSYN )  IV 12.5 mL/hr at 06/01/24 1400   sodium bicarbonate  25 mEq (Impella PURGE) in dextrose  5 % 1000 mL bag     vasopressin  Stopped (06/01/24 1220)    PRN Medications: albumin human, fentaNYL , haloperidol lactate, morphine  injection, ondansetron  (ZOFRAN ) IV, mouth rinse, oxyCODONE , sodium chloride  flush, sodium chloride  flush, sodium chloride  flush, traMADol    Patient Profile   Thomas [name] is a 43 y.o. male with a PMH of CAD s/p PCI to the OM1 in 2017, chronic angina, right nephrectomy, CKD stage IIIa, HIV, hodgkin's  lymphoma treated with chemotherapy who presents with STEMI. Course complicated by post cardiotomy shock and subsequent central cannulation for ECMO.  Assessment/Plan   Post cardiotomy shock:  - Cannulated 11/5 for central ECMO - EF in the lab (unloaded) around 25-30% with anterior and inferior akinesis - IABP removed 11/5 - Impella 5.5 placed 11/6 - Decannulated 11/7 - Extubated today, doing well. Impella at P7, 3.7L/min -Wean milrinone to 0.25 today - Continue flat dose heparin , LDH ok, no evidence of hemolysis - Stop vanc with closed chest, continue zosyn   CAD/STEMI: Initial EKG with lateral STEMI, changes and chest pain improved in the ED but given dynamic changes taking to the cath  lab. - S/p POBA to the ostial LAD - Poor stent landing zone, multivessel CAD, reduced EF - Poor CABG targets, s/p LIMA-D1, SVG-OM - plavix off with MCS, start prior to discharge. Aspirin  325mg  daily for now - Statin on hold with shock  Acute systolic heart failure: - Due to ischemia - Management of shock as above  HIV:  - Continue home meds  AKI on CKD: Prior right nephrectomy due to complications from kidney stone, chronic hydronephrosis. Creatinine improving with support - due to shock, ATN - 80mg  IV lasix x1  CRITICAL CARE Performed by: Morene JINNY Brownie   Total critical care time: 40 minutes  Critical care time was exclusive of separately billable procedures and treating other patients.  Critical care was necessary to treat or prevent imminent or life-threatening deterioration.  Critical care was time spent personally by me on the following activities: development of treatment plan with patient and/or surrogate as well as nursing, discussions with consultants, evaluation of patient's response to treatment, examination of patient, obtaining history from patient or surrogate, ordering and performing treatments and interventions, ordering and review of laboratory studies, ordering and review of radiographic studies, pulse oximetry and re-evaluation of patient's condition.

## 2024-06-01 NOTE — Progress Notes (Signed)
 PHARMACY - ANTICOAGULATION CONSULT NOTE  Pharmacy Consult for heparin  Indication: Impella 5.5   Allergies  Allergen Reactions   Tylenol  [Acetaminophen ] Other (See Comments)    Cold sweats    Patient Measurements: Height: 6' 1 (185.4 cm) Weight: 93 kg (205 lb 0.4 oz) IBW/kg (Calculated) : 79.9 HEPARIN  DW (KG): 94.8  Vital Signs: Temp: 98.8 F (37.1 C) (11/09 0800) Temp Source: Core (11/09 0400) Pulse Rate: 117 (11/09 0800)  Labs: Recent Labs    05/29/24 1846 05/29/24 1858 05/30/24 0449 05/30/24 0456 05/31/24 0403 05/31/24 0628 05/31/24 1730 05/31/24 1752 06/01/24 0414 06/01/24 0423  HGB 8.9*   < > 7.3*   < > 8.4*   < >  --  8.9* 7.1* 7.6*  HCT 26.2*   < > 21.5*   < > 24.2*   < >  --  26.1* 21.0* 22.4*  PLT 60*  --  68*   < > 70*  --   --  90*  --  83*  APTT 39*  --  33  --   --   --   --   --   --   --   LABPROT  --   --  16.5*  --  16.3*  --   --   --   --  16.0*  INR  --   --  1.3*  --  1.2  --   --   --   --  1.2  HEPARINUNFRC  --   --   --   --   --   --   --   --   --  <0.10*  CREATININE  --    < > 1.40*   < > 1.45*  --  1.49*  --   --  1.46*   < > = values in this interval not displayed.    Estimated Creatinine Clearance: 75.2 mL/min (A) (by C-G formula based on SCr of 1.46 mg/dL (H)).    Assessment: 43yo male presented with chest pain and found to have STEMI. IABP placed 11/4>CABG 11/5 unstable coming off pump > central ECMO cannulation > impella 5.5 placed 11/6> decannulated 11/7.  Hgb 8 LFTs wnl, bibrinogen slightly elevated 700 LDH stable 200 Impella 5.5 P4>P7 with bicarb purge solution 11/8 started heparin  drip 500 uts/hr to prevent pump thrombosis Hgb low stable 7s PLTC low stable post op 80s no bleeding noted LDH stable 200s fibrinogen  stable 700s   Goal of Therapy:  Heparin  level 0.2-0.5 units/ml Monitor platelets by anticoagulation protocol: Yes  Plan:  Heparin  drip 500 uts/hr  Daily heparin  level and cbc  Monitor s/s bleeding     Olam Chalk Pharm.D. CPP, BCPS Clinical Pharmacist [phone] 06/01/2024 8:14 AM

## 2024-06-01 NOTE — Progress Notes (Signed)
   [address] [place] [address]             [phone]   Up in chair  Was agitated earlier requiring Haldol  BP 109/65   Pulse (!) 140   Temp 99.5 F (37.5 C)   Resp 19   Ht 6' 1 (1.854 m)   Wt 93 kg   SpO2 96%   BMI 27.05 kg/m  Impella p4 2.6 L/min flow 20/9 CI 3.2 Milrinone 0.25, epi 3  Intake/Output Summary (Last 24 hours) at 06/01/2024 1806 Last data filed at 06/01/2024 1700 Gross per 24 hour  Intake 4693.51 ml  Output 8680 ml  Net -3986.49 ml   Co-ox 67  Continues to improve  Elspeth C. Kerrin, MD [facility] [phone]

## 2024-06-01 NOTE — Progress Notes (Signed)
 SLP Cancellation Note  Patient Details Name: CORNELIO PARKERSON MRN: [id number] DOB: [birth year]   Cancelled treatment:       Reason Eval/Treat Not Completed: Patient not medically ready. NG tube set to suction due to ileus. RN says check in tomorrow.    Kaleiyah Polsky, Consuelo Fitch 06/01/2024, 3:16 PM

## 2024-06-02 ENCOUNTER — Inpatient Hospital Stay (HOSPITAL_COMMUNITY)

## 2024-06-02 DIAGNOSIS — I5021 Acute systolic (congestive) heart failure: Secondary | ICD-10-CM | POA: Diagnosis not present

## 2024-06-02 DIAGNOSIS — Z951 Presence of aortocoronary bypass graft: Secondary | ICD-10-CM | POA: Diagnosis not present

## 2024-06-02 DIAGNOSIS — J189 Pneumonia, unspecified organism: Secondary | ICD-10-CM | POA: Diagnosis not present

## 2024-06-02 DIAGNOSIS — N183 Chronic kidney disease, stage 3 unspecified: Secondary | ICD-10-CM

## 2024-06-02 DIAGNOSIS — K567 Ileus, unspecified: Secondary | ICD-10-CM

## 2024-06-02 DIAGNOSIS — R570 Cardiogenic shock: Secondary | ICD-10-CM | POA: Diagnosis not present

## 2024-06-02 DIAGNOSIS — I213 ST elevation (STEMI) myocardial infarction of unspecified site: Secondary | ICD-10-CM

## 2024-06-02 DIAGNOSIS — E440 Moderate protein-calorie malnutrition: Secondary | ICD-10-CM | POA: Insufficient documentation

## 2024-06-02 DIAGNOSIS — I2102 ST elevation (STEMI) myocardial infarction involving left anterior descending coronary artery: Secondary | ICD-10-CM | POA: Diagnosis not present

## 2024-06-02 DIAGNOSIS — N179 Acute kidney failure, unspecified: Secondary | ICD-10-CM | POA: Diagnosis not present

## 2024-06-02 LAB — TYPE AND SCREEN
ABO/RH(D): O POS
Antibody Screen: NEGATIVE
Unit division: 0
Unit division: 0
Unit division: 0
Unit division: 0
Unit division: 0
Unit division: 0
Unit division: 0
Unit division: 0
Unit division: 0
Unit division: 0
Unit division: 0
Unit division: 0

## 2024-06-02 LAB — BPAM RBC
Blood Product Expiration Date: 202511232359
Blood Product Expiration Date: 202511232359
Blood Product Expiration Date: 202511272359
Blood Product Expiration Date: 202511272359
Blood Product Expiration Date: 202512022359
Blood Product Expiration Date: 202512022359
Blood Product Expiration Date: 202512032359
Blood Product Expiration Date: 202512032359
Blood Product Expiration Date: 202512032359
Blood Product Expiration Date: 202512032359
Blood Product Expiration Date: 202512032359
Blood Product Expiration Date: 202512032359
ISSUE DATE / TIME: 202511050947
ISSUE DATE / TIME: 202511050947
ISSUE DATE / TIME: 202511051457
ISSUE DATE / TIME: 202511061711
ISSUE DATE / TIME: 202511070816
ISSUE DATE / TIME: 202511071248
ISSUE DATE / TIME: 202511071248
ISSUE DATE / TIME: 202511071248
ISSUE DATE / TIME: 202511071248
ISSUE DATE / TIME: 202511081126
Unit Type and Rh: 5100
Unit Type and Rh: 5100
Unit Type and Rh: 5100
Unit Type and Rh: 5100
Unit Type and Rh: 5100
Unit Type and Rh: 5100
Unit Type and Rh: 5100
Unit Type and Rh: 5100
Unit Type and Rh: 5100
Unit Type and Rh: 5100
Unit Type and Rh: 5100
Unit Type and Rh: 5100

## 2024-06-02 LAB — ECHOCARDIOGRAM COMPLETE
Calc EF: 50.8 %
Height: 73 in
Single Plane A2C EF: 51.6 %
Single Plane A4C EF: 51.5 %
Weight: 3118.19 [oz_av]

## 2024-06-02 LAB — BASIC METABOLIC PANEL WITH GFR
Anion gap: 11 (ref 5–15)
Anion gap: 12 (ref 5–15)
BUN: 12 mg/dL (ref 6–20)
BUN: 11 mg/dL (ref 6–20)
CO2: 23 mmol/L (ref 22–32)
CO2: 24 mmol/L (ref 22–32)
Calcium: 9.1 mg/dL (ref 8.9–10.3)
Calcium: 8.9 mg/dL (ref 8.9–10.3)
Chloride: 106 mmol/L (ref 98–111)
Chloride: 104 mmol/L (ref 98–111)
Creatinine, Ser: 1.6 mg/dL — ABNORMAL HIGH (ref 0.61–1.24)
Creatinine, Ser: 1.39 mg/dL — ABNORMAL HIGH (ref 0.61–1.24)
GFR, Estimated: 55 mL/min — ABNORMAL LOW (ref >60)
GFR, Estimated: >60 mL/min (ref >60)
Glucose, Bld: 100 mg/dL — ABNORMAL HIGH (ref 70–99)
Glucose, Bld: 170 mg/dL — ABNORMAL HIGH (ref 70–99)
Potassium: 3.7 mmol/L (ref 3.5–5.1)
Potassium: 3.6 mmol/L (ref 3.5–5.1)
Sodium: 140 mmol/L (ref 135–145)
Sodium: 140 mmol/L (ref 135–145)

## 2024-06-02 LAB — HEPATIC FUNCTION PANEL
ALT: 21 U/L (ref 0–44)
AST: 39 U/L (ref 15–41)
Albumin: 3.4 g/dL — ABNORMAL LOW (ref 3.5–5.0)
Alkaline Phosphatase: 47 U/L (ref 38–126)
Bilirubin, Direct: 1.7 mg/dL — ABNORMAL HIGH (ref 0.0–0.2)
Indirect Bilirubin: 1.4 mg/dL — ABNORMAL HIGH (ref 0.3–0.9)
Total Bilirubin: 3.1 mg/dL — ABNORMAL HIGH (ref 0.0–1.2)
Total Protein: 7.3 g/dL (ref 6.5–8.1)

## 2024-06-02 LAB — COOXEMETRY PANEL
Carboxyhemoglobin: 3.8 % — ABNORMAL HIGH (ref 0.5–1.5)
Methemoglobin: <0.7 % (ref 0.0–1.5)
O2 Saturation: 65.7 %
Total hemoglobin: 9.4 g/dL — ABNORMAL LOW (ref 12.0–16.0)

## 2024-06-02 LAB — CULTURE, RESPIRATORY W GRAM STAIN
Culture: NO GROWTH
Culture: NO GROWTH

## 2024-06-02 LAB — CBC
HCT: 28.1 % — ABNORMAL LOW (ref 39.0–52.0)
HCT: 27.1 % — ABNORMAL LOW (ref 39.0–52.0)
Hemoglobin: 9.4 g/dL — ABNORMAL LOW (ref 13.0–17.0)
Hemoglobin: 8.8 g/dL — ABNORMAL LOW (ref 13.0–17.0)
MCH: 31.6 pg (ref 26.0–34.0)
MCH: 31.3 pg (ref 26.0–34.0)
MCHC: 33.5 g/dL (ref 30.0–36.0)
MCHC: 32.5 g/dL (ref 30.0–36.0)
MCV: 94.6 fL (ref 80.0–100.0)
MCV: 96.4 fL (ref 80.0–100.0)
Platelets: 102 K/uL — ABNORMAL LOW (ref 150–400)
Platelets: 119 K/uL — ABNORMAL LOW (ref 150–400)
RBC: 2.97 MIL/uL — ABNORMAL LOW (ref 4.22–5.81)
RBC: 2.81 MIL/uL — ABNORMAL LOW (ref 4.22–5.81)
RDW: 15.5 % (ref 11.5–15.5)
RDW: 15.5 % (ref 11.5–15.5)
WBC: 8.1 K/uL (ref 4.0–10.5)
WBC: 6.9 K/uL (ref 4.0–10.5)
nRBC: 0 % (ref 0.0–0.2)
nRBC: 0 % (ref 0.0–0.2)

## 2024-06-02 LAB — MAGNESIUM: Magnesium: 2.8 mg/dL — ABNORMAL HIGH (ref 1.7–2.4)

## 2024-06-02 LAB — GLUCOSE, CAPILLARY
Glucose-Capillary: 107 mg/dL — ABNORMAL HIGH (ref 70–99)
Glucose-Capillary: 109 mg/dL — ABNORMAL HIGH (ref 70–99)
Glucose-Capillary: 118 mg/dL — ABNORMAL HIGH (ref 70–99)
Glucose-Capillary: 127 mg/dL — ABNORMAL HIGH (ref 70–99)
Glucose-Capillary: 95 mg/dL (ref 70–99)
Glucose-Capillary: 99 mg/dL (ref 70–99)

## 2024-06-02 LAB — HEPARIN LEVEL (UNFRACTIONATED): Heparin Unfractionated: <0.1 [IU]/mL — ABNORMAL LOW (ref 0.30–0.70)

## 2024-06-02 LAB — PROTIME-INR
INR: 1.2 (ref 0.8–1.2)
Prothrombin Time: 15.7 s — ABNORMAL HIGH (ref 11.4–15.2)

## 2024-06-02 LAB — LACTATE DEHYDROGENASE: LDH: 309 U/L — ABNORMAL HIGH (ref 98–192)

## 2024-06-02 LAB — FIBRINOGEN: Fibrinogen: >800 mg/dL — ABNORMAL HIGH (ref 210–475)

## 2024-06-02 MED ORDER — HEPARIN 30,000 UNITS/1000 ML (OHS) CELLSAVER SOLUTION
Status: DC
  Filled 2024-06-02: qty 1000

## 2024-06-02 MED ORDER — QUETIAPINE FUMARATE 25 MG PO TABS: 25.0000 mg | ORAL_TABLET | Freq: Every day | ORAL | Status: DC

## 2024-06-02 MED ORDER — VANCOMYCIN HCL 1.5 G IV SOLR
1500.0000 mg | INTRAVENOUS | Status: AC
  Filled 2024-06-02: qty 30

## 2024-06-02 MED ORDER — PHENYLEPHRINE HCL-NACL 20-0.9 MG/250ML-% IV SOLN
30.0000 ug/min | INTRAVENOUS | Status: DC
  Filled 2024-06-02: qty 250

## 2024-06-02 MED ORDER — ATORVASTATIN CALCIUM 40 MG PO TABS
40.0000 mg | ORAL_TABLET | Freq: Every day | ORAL | Status: DC
  Administered 2024-06-03 – 2024-06-04 (×2): 40 mg via ORAL
  Filled 2024-06-02 (×2): qty 1

## 2024-06-02 MED ORDER — INSULIN REGULAR(HUMAN) IN NACL 100-0.9 UT/100ML-% IV SOLN
INTRAVENOUS | Status: AC
  Filled 2024-06-02: qty 100

## 2024-06-02 MED ORDER — QUETIAPINE FUMARATE 25 MG PO TABS
25.0000 mg | ORAL_TABLET | Freq: Every day | ORAL | Status: DC
  Filled 2024-06-02: qty 1

## 2024-06-02 MED ORDER — LIDOCAINE 5 % EX PTCH
1.0000 | MEDICATED_PATCH | CUTANEOUS | Status: DC
  Administered 2024-06-02 – 2024-06-05 (×4): 1 via TRANSDERMAL
  Filled 2024-06-02 (×4): qty 1

## 2024-06-02 MED ORDER — CEFAZOLIN SODIUM-DEXTROSE 2-4 GM/100ML-% IV SOLN
2.0000 g | INTRAVENOUS | Status: DC
  Filled 2024-06-02: qty 100

## 2024-06-02 MED ORDER — MILRINONE LACTATE IN DEXTROSE 20-5 MG/100ML-% IV SOLN
0.3000 ug/kg/min | INTRAVENOUS | Status: DC
  Filled 2024-06-02: qty 100

## 2024-06-02 MED ORDER — DOCUSATE SODIUM 50 MG/5ML PO LIQD
100.0000 mg | Freq: Two times a day (BID) | ORAL | Status: DC
  Administered 2024-06-03: 100 mg via ORAL
  Filled 2024-06-02 (×2): qty 10

## 2024-06-02 MED ORDER — DEXMEDETOMIDINE HCL IN NACL 400 MCG/100ML IV SOLN
0.1000 ug/kg/h | INTRAVENOUS | Status: DC
  Filled 2024-06-02: qty 100

## 2024-06-02 MED ORDER — EPINEPHRINE HCL 5 MG/250ML IV SOLN IN NS
0.0000 ug/min | INTRAVENOUS | Status: DC
  Filled 2024-06-02: qty 250

## 2024-06-02 MED ORDER — TRANEXAMIC ACID (OHS) PUMP PRIME SOLUTION
2.0000 mg/kg | INTRAVENOUS | Status: DC
  Filled 2024-06-02: qty 1.77

## 2024-06-02 MED ORDER — POTASSIUM CHLORIDE 10 MEQ/50ML IV SOLN
10.0000 meq | INTRAVENOUS | Status: AC
  Administered 2024-06-02 (×3): 10 meq via INTRAVENOUS
  Filled 2024-06-02 (×3): qty 50

## 2024-06-02 MED ORDER — CEFAZOLIN SODIUM-DEXTROSE 2-4 GM/100ML-% IV SOLN
2.0000 g | INTRAVENOUS | Status: AC
  Filled 2024-06-02: qty 100

## 2024-06-02 MED ORDER — POLYETHYLENE GLYCOL 3350 17 G PO PACK: 17.0000 g | PACK | Freq: Every day | ORAL | Status: DC

## 2024-06-02 MED ORDER — NOREPINEPHRINE 4 MG/250ML-% IV SOLN
0.0000 ug/min | INTRAVENOUS | Status: AC
  Administered 2024-06-03: 2 ug/min via INTRAVENOUS
  Filled 2024-06-02: qty 250

## 2024-06-02 MED ORDER — POTASSIUM CHLORIDE 2 MEQ/ML IV SOLN
80.0000 meq | INTRAVENOUS | Status: DC
  Filled 2024-06-02: qty 40

## 2024-06-02 MED ORDER — PLASMA-LYTE A IV SOLN
INTRAVENOUS | Status: DC
  Filled 2024-06-02: qty 2.5

## 2024-06-02 MED ORDER — QUETIAPINE FUMARATE 25 MG PO TABS
25.0000 mg | ORAL_TABLET | Freq: Every day | ORAL | Status: DC
  Administered 2024-06-02 – 2024-06-03 (×2): 25 mg via ORAL
  Filled 2024-06-02 (×2): qty 1

## 2024-06-02 MED ORDER — NITROGLYCERIN IN D5W 200-5 MCG/ML-% IV SOLN
2.0000 ug/min | INTRAVENOUS | Status: DC
  Filled 2024-06-02: qty 250

## 2024-06-02 MED ORDER — OXYCODONE HCL 5 MG PO TABS
5.0000 mg | ORAL_TABLET | ORAL | Status: DC | PRN
  Administered 2024-06-03: 5 mg via ORAL
  Filled 2024-06-02: qty 1

## 2024-06-02 MED ORDER — MANNITOL 20 % IV SOLN
INTRAVENOUS | Status: DC
  Filled 2024-06-02: qty 13

## 2024-06-02 MED ORDER — POLYETHYLENE GLYCOL 3350 17 G PO PACK: 17.0000 g | PACK | Freq: Two times a day (BID) | ORAL | Status: DC

## 2024-06-02 MED ORDER — OXYCODONE HCL 5 MG PO TABS
5.0000 mg | ORAL_TABLET | ORAL | Status: DC | PRN
  Administered 2024-06-02: 5 mg
  Filled 2024-06-02: qty 1

## 2024-06-02 MED ORDER — TRANEXAMIC ACID (OHS) BOLUS VIA INFUSION
15.0000 mg/kg | INTRAVENOUS | Status: DC
  Filled 2024-06-02: qty 1326

## 2024-06-02 MED ORDER — TRANEXAMIC ACID 1000 MG/10ML IV SOLN
1.5000 mg/kg/h | INTRAVENOUS | Status: DC
  Filled 2024-06-02: qty 25

## 2024-06-02 MED ORDER — FUROSEMIDE 40 MG PO TABS: 40.0000 mg | ORAL_TABLET | Freq: Every day | ORAL | Status: DC

## 2024-06-02 MED ORDER — TRAMADOL HCL 50 MG PO TABS: 50.0000 mg | ORAL_TABLET | ORAL | Status: DC | PRN

## 2024-06-02 MED ORDER — ENSURE PLUS HIGH PROTEIN PO LIQD
237.0000 mL | Freq: Two times a day (BID) | ORAL | Status: DC
  Administered 2024-06-02 – 2024-06-05 (×4): 237 mL via ORAL

## 2024-06-02 MED FILL — Fentanyl Citrate Preservative Free (PF) Inj 2500 MCG/50ML: INTRAMUSCULAR | Qty: 50 | Status: AC

## 2024-06-02 MED FILL — Sodium Chloride IV Soln 0.9%: INTRAVENOUS | Qty: 250 | Status: AC

## 2024-06-02 NOTE — Progress Notes (Addendum)
 NAME:  Thomas [name], MRN:  [id number], DOB:  1981-05-27, LOS: 6 ADMISSION DATE:  05/27/2024, CONSULTATION DATE:  05/28/2024 REFERRING MD:  Dr. Shyrl, CHIEF COMPLAINT:  s/p CABG, post-op vent management  History of Present Illness:  Thomas [name] is a 43 year old male with history of CAD c/b cardiac arrest s/p PCI to OM1 in 2017, s/p right nephrectomy, CKD stage IIIa, HIV (on HAART), Hogdkin's lymphoma (s/p chemo now in remission) who was admitted 05/27/2024 with chest pain and found to have lateral STEMI. He underwent LHC which showed 20% distal LM, 99% ostial LAD, 30% mid LCx, and 30% proximal RCA disease with likely chronically occluded mid LAD and OM1 stent. He underwent successful PTCA to ostial LAD and IABP placement for stabilization. TCTS was consulted for consideration of CABG.  On 11/5 he underwent CABGx2 (LIMA-diag, SVG-OM) with Dr. Shyrl. Intra-op course notable for vasoplegia and cardioplegia with inability to come off bypass due to hypotension. Patient transitioned to [facility] via RA and aortic cannulation and was transferred to [facility] ICU for further management.    Intra-op he received 710 mL cell saver, 2 PRBC and 1.5L LR. UOP 575 mL. Aortic cross clamp time 53 minutes, CPB time 119 minutes. TEE showed normal RV, LV dysfunction. Pre-op EF 30-35% on epi and IABP.   Pertinent  Medical History  Per above  Significant Hospital Events: Including procedures, antibiotic start and stop dates in addition to other pertinent events   11/4 admitted for lateral STEMI underwent successful PTCA to ostial LAD  11/5 CABG x 2 (LIMA>Lcx, SVG> OM) complicated by cardiogenic shock requiring central ECMO cannulation to come off bypass.  11/6 impella 5.5 placed, bronch for mucus plugging entire R lung 11/7 decannulated from ECMO, increased inotropes. Mucus plug entire R lung> bronch 11/8 failed SBT 11/9 extubated; still high NGT output  Interim History / Subjective:  NGT output improving  overnight but remained high during the day. Did have 1 BM yesterday. Impella decreased P4. Milrinone and epi weaned off overnight. Complains of anxiety and mild incisional chest pain this morning.    Objective    Blood pressure 103/77, pulse (!) 109, temperature 97.7 F (36.5 C), resp. rate (!) 22, height 6' 1 (1.854 m), weight 88.4 kg, SpO2 95%. PAP: (13-36)/(5-24) 36/21 CVP:  [0 mmHg-16 mmHg] 13 mmHg CO:  [5.6 L/min-7.3 L/min] 5.7 L/min CI:  [2.57 L/min/m2-3.34 L/min/m2] 2.63 L/min/m2  FiO2 (%):  [40 %] 40 %   Intake/Output Summary (Last 24 hours) at 06/02/2024 0831 Last data filed at 06/02/2024 0700 Gross per 24 hour  Intake 2206.42 ml  Output 8285 ml  Net -6078.58 ml   Filed Weights   05/31/24 0418 06/01/24 0604 06/02/24 0500  Weight: 93.9 kg 93 kg 88.4 kg    Examination: General: ill appearing male, resting in bed, no acute distress HENT: [place]/AT, sclera anicteric, NGT in place Lungs: breathing comfortably on nasal cannula, clear to auscultation bilaterally  Cardiovascular: regular rate and rhythm, normal S1 and S2. Impella at P4  Abdomen: soft, non-tender, non-distended, +bowel sounds  Extremities: warm, dry, no edema, right axillary impella  Neuro: alert and responsive, follows commands with symmetric strength throughout  GU: foley in place draining clear yellow urine   I/O -6.2L  Resolved problem list  AKI Post-operative mechanical ventilation   Assessment and Plan   Acute post-bypass cardiogenic shock requiring [facility] ECMO > decannulated 11/7 CAD s/p CABG x2 (LIMA-diag, SVG-OM) Acute post-bypass vasoplegia Acute lateral STEMI s/p PCI  and IABP  IABP removed 11/5. Impella 5.5 placed 11/6. ECMO decannulation 11/7. Weaned off vaso and levo 11/9.  - Post-op care per TCTS - Impella at P4; continue heparin  at 500 units/hr. Plan for decannulation tomorrow  - Continue milrinone 0.125 mcg/kg/min in anticipation of decannulation  - Aspirin , statin once able to take  PO meds - Monitor electrolytes, replete as needed - Diuresis per advanced heart failure   Mucus plugging-- RLL still consolidated, possible pneumonia Currently breathing comfortably on 0.5L nasal cannula. BAL 11/6 no growth x 3 days. BAL 11/7 no growth x 2 days. CXR this morning with bibasilar atelectasis and mild pulmonary venous congestion. - Continue to wean oxygen to maintain SpO2>92% - Encourage IS/pulmonary toilet   Ileus, improving  NGT placed. Now having bowel movements.  - Clamp NGT  - SLP evaluation and advance to clear liquid diet as tolerated   CKD stage IIIa S/p right nephrectomy (04/30/2014) for nonresolving nephrolithiasis - strict I/O - renally dose meds, avoid nephrotoxic meds - will discontinue foley   Expected post-operative acute blood loss anemia Expected post-operative consumptive thrombocytopenia, improving  - transfuse for Hb <7 or hemodynamically significant bleeding - watch platelets closely with heparin  and impella - trend - repeat afternoon CBC  Acute post-operative pain Anxiety - Start Seroquel 25 mg daily, continue PRN haldol  - Add lidocaine  patch - Continue pain control with scheduled APAP, PRN tramadol, oxycodone  and morphine    Deconditioning - Continue PT, OT - progressive mobility  History of HIV; has had undetectable VL since 2019 - On cebenuva at home; due for next injection 06/30/2024   Stress hyperglycemia - Continue SSI, has not required any insulin  but has been NPO so will continue for now until able to take PO  - CBG goal 140-180   D/w AHF during rounds.   Labs   CBC: Recent Labs  Lab 05/27/24 0740 05/27/24 0743 05/27/24 1139 05/28/24 0243 05/31/24 0403 05/31/24 0628 05/31/24 1752 06/01/24 0414 06/01/24 0423 06/01/24 1503 06/01/24 1505 06/02/24 0333  WBC 5.2  --  5.6   < > 8.9  --  11.9*  --  7.7  --  10.1 8.1  NEUTROABS 2.7  --  3.1  --   --   --   --   --   --   --   --   --   HGB 13.8   < > 14.6   < > 8.4*   <  > 8.9* 7.1* 7.6* 10.5* 10.0* 9.4*  HCT 40.7   < > 44.5   < > 24.2*   < > 26.1* 21.0* 22.4* 31.0* 29.6* 28.1*  MCV 98.5  --  100.7*   < > 92.4  --  93.2  --  93.3  --  93.4 94.6  PLT 187  --  204   < > 70*  --  90*  --  83*  --  109* 102*   < > = values in this interval not displayed.    Basic Metabolic Panel: Recent Labs  Lab 05/30/24 0449 05/30/24 0456 05/30/24 1758 05/30/24 1852 05/31/24 0403 05/31/24 0628 05/31/24 1730 06/01/24 0414 06/01/24 0423 06/01/24 1503 06/01/24 1505 06/02/24 0333  NA 139   < > 134*   < > 135   < > 133* 135 134* 140 140 140  K 4.0   < > 4.0   < > 4.0   < > 4.1 3.7 3.5 3.8 4.0 3.7  CL 107  --  105  --  107  --  101  --  100  --  102 106  CO2 20*  --  20*  --  20*  --  19*  --  20*  --  21* 23  GLUCOSE 124*  --  166*  --  133*  --  149*  --  122*  --  108* 100*  BUN 8  --  7  --  8  --  9  --  8  --  11 12  CREATININE 1.40*  --  1.32*  --  1.45*  --  1.49*  --  1.46*  --  1.64* 1.60*  CALCIUM  7.6*  --  7.5*  --  7.6*  --  7.7*  --  7.8*  --  9.1 9.1  MG 1.9  --  2.0  --  1.9  --  2.2  --  2.1  --   --  2.8*  PHOS 3.0  --   --   --   --   --   --   --  2.7  --   --   --    < > = values in this interval not displayed.   GFR: Estimated Creatinine Clearance: 68.7 mL/min (A) (by C-G formula based on SCr of 1.6 mg/dL (H)). Recent Labs  Lab 05/29/24 1846 05/30/24 0449 05/30/24 0502 05/30/24 1322 05/30/24 1907 05/31/24 0403 05/31/24 0634 05/31/24 1752 06/01/24 0423 06/01/24 1505 06/02/24 0333  WBC 7.2   < >  --    < >  --    < >  --  11.9* 7.7 10.1 8.1  LATICACIDVEN 0.8  --  0.5  --  1.1  --  0.8  --   --   --   --    < > = values in this interval not displayed.    Critical care time:     The patient is critically ill with multiple organ system failure and requires high complexity decision making for assessment and support, frequent evaluation and titration of therapies, advanced monitoring, review of radiographic studies and interpretation of  complex data.   Critical Care Time devoted to patient care services, exclusive of separately billable procedures, described in this note is 30 minutes.  Rexene LOISE Blush, PA-C [facility] 06/02/24 8:51 AM  Please see [email or web address] for pager details.  From 7A-7P if no response, please call [phone] After hours, please call ELink [phone]

## 2024-06-02 NOTE — Progress Notes (Signed)

## 2024-06-02 NOTE — Evaluation (Signed)
 Clinical/Bedside Swallow Evaluation Patient Details  Name: Thomas [name] MRN: [id number] Date of Birth: 28-Apr-1981  Today's Date: 06/02/2024 Time: SLP Start Time (ACUTE ONLY): 0946 SLP Stop Time (ACUTE ONLY): 1001 SLP Time Calculation (min) (ACUTE ONLY): 15 min  Past Medical History:  Past Medical History:  Diagnosis Date   Acute respiratory failure with hypoxemia: post cardiac arrest 05/19/2014   Cancer (HCC)    hodgkins, no current treatment for   Cardiac arrest (HCC) 05/15/2014   Chronic renal insufficiency    CKD (chronic kidney disease) s/p Right nephrectomy (04/30/2014) 05/19/2014   CLABSI (central line-associated bloodstream infection) 02/27/2014   Enteritis 01/22/2014   History of blood transfusion july 2015 per pt   HIV disease (HCC) 06/25/2012   Hydronephrosis, right & hydroureter: secondary to obstructing ureteral calculi 03/01/2014   Leg pain 07/03/2012   Nephrolithiasis    NSTEMI (non-ST elevated myocardial infarction) (HCC) 05/19/2014   Obstructive uropathy 12/12/2014   Status post left renal stent   Penile cyst 08/06/2012   Septic shock (HCC) 01/18/2014   Shingles rash 02/25/2014   Sinus tachycardia 08/06/2012   Skin lesion 07/03/2012   Staphylococcus aureus bacteremia 02/25/2014   Transaminitis 05/19/2014   Past Surgical History:  Past Surgical History:  Procedure Laterality Date   ABCESS DRAINAGE  08/2008   drainage of perirectal 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  this after several previous I and Ds of rectal abcess.    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   CANNULATION FOR ECMO (EXTRACORPOREAL MEMBRANE OXYGENATION)  05/28/2024   Procedure: CANNULATION FOR ECMO (EXTRACORPOREAL MEMBRANE OXYGENATION);  Surgeon: Shyrl Linnie KIDD, MD;  Location: [facility] OR;  Service: Open Heart Surgery;;   CARDIAC CATHETERIZATION  05/19/2014   CARDIAC CATHETERIZATION N/A 10/28/2015   Procedure: Left Heart Cath and Coronary Angiography;  Surgeon: Gordy Bergamo, MD;   Location: [facility] INVASIVE CV LAB;  Service: Cardiovascular;  Laterality: N/A;   CORONARY ARTERY BYPASS GRAFT N/A 05/28/2024   Procedure: CORONARY ARTERY BYPASS GRAFTING (CABG) TIMES TWO, UTILIZING LEFT INTERNAL MAMMARY ARTERY AND ENDOVEIN HARVEST RIGHT GREATER SAPHENOUS VEIN;  Surgeon: Shyrl Linnie KIDD, MD;  Location: [facility] OR;  Service: Open Heart Surgery;  Laterality: N/A;   CORONARY STENT PLACEMENT  05/19/2014   OMI   & PROXIMAL CIRCUMFLEX   CORONARY/GRAFT ACUTE MI REVASCULARIZATION N/A 05/27/2024   Procedure: Coronary/Graft Acute MI Revascularization;  Surgeon: Elmira Newman PARAS, MD;  Location: [facility] INVASIVE CV LAB;  Service: Cardiovascular;  Laterality: N/A;   CYSTOSCOPY WITH RETROGRADE PYELOGRAM, URETEROSCOPY AND STENT PLACEMENT Left 12/11/2014   Procedure: CYSTOSCOPY WITH RETROGRADE PYELOGRAM, URETEROSCOPY, STONE REMOVAL, AND STENT PLACEMENT;  Surgeon: Mark Ottelin, MD;  Location: [facility] ORS;  Service: Urology;  Laterality: Left;   HOLMIUM LASER APPLICATION Left 12/11/2014   Procedure: HOLMIUM LASER APPLICATION;  Surgeon: Mark Ottelin, MD;  Location: [facility] ORS;  Service: Urology;  Laterality: Left;   IABP INSERTION N/A 05/27/2024   Procedure: IABP Insertion;  Surgeon: Elmira Newman PARAS, MD;  Location: [facility] INVASIVE CV LAB;  Service: Cardiovascular;  Laterality: N/A;   LAPAROSCOPIC NEPHRECTOMY Right 04/30/2014   Procedure: LAPAROSCOPIC NEPHRECTOMY AND URETERECTOMY;  Surgeon: Gretel Ferrara, MD;  Location: [facility] ORS;  Service: Urology;  Laterality: Right;   LEFT HEART CATH AND CORONARY ANGIOGRAPHY N/A 05/27/2024   Procedure: LEFT HEART CATH AND CORONARY ANGIOGRAPHY;  Surgeon: Elmira Newman PARAS, MD;  Location: [facility] INVASIVE CV LAB;  Service: Cardiovascular;  Laterality: N/A;   LEFT HEART CATHETERIZATION WITH CORONARY ANGIOGRAM N/A 05/18/2014   Procedure: LEFT HEART  CATHETERIZATION WITH CORONARY ANGIOGRAM;  Surgeon: Erick JONELLE Bergamo, MD;  Location[facility] CATH LAB;  Service: Cardiovascular;  Laterality: N/A;   pac  placed and later removed     PERCUTANEOUS CORONARY STENT INTERVENTION (PCI-S) N/A 05/19/2014   Procedure: PERCUTANEOUS CORONARY STENT INTERVENTION (PCI-S);  Surgeon: Erick JONELLE Bergamo, MD;  Location: [facility] CATH LAB;  Service: Cardiovascular;  Laterality: N/A;   PLACEMENT OF IMPELLA LEFT VENTRICULAR ASSIST DEVICE Right 05/29/2024   Procedure: INSERTION OF CARDIAC ASSIST DEVICE, IMPELLA 5.5;  Surgeon: Shyrl Linnie KIDD, MD;  Location: [facility] OR;  Service: Open Heart Surgery;  Laterality: Right;   right nephrostomy tube  for last month and 1/2   SUPRACLAVICAL NODE BIOPSY Right 08/08/2013   Procedure: SUPRACLAVICULAR LYMPH NODE BIOPSY;  Surgeon: Dorise MARLA Fellers, MD;  Location: [facility] OR;  Service: Thoracic;  Laterality: Right;   TEE WITHOUT CARDIOVERSION N/A 02/27/2014   Procedure: TRANSESOPHAGEAL ECHOCARDIOGRAM (TEE);  Surgeon: Vinie KYM Maxcy, MD;  Location: [facility] ENDOSCOPY;  Service: Cardiovascular;  Laterality: N/A;   TEE WITHOUT CARDIOVERSION N/A 05/28/2024   Procedure: ECHOCARDIOGRAM, TRANSESOPHAGEAL;  Surgeon: Shyrl Linnie KIDD, MD;  Location: [facility] OR;  Service: Open Heart Surgery;  Laterality: N/A;   HPI:  Thomas [name] is a 43 year old male admitted 05/27/2024 with chest pain and dx of lateral STEMI. Underwent PTCA to ostial LAD, 11/5 CABG x 2 complicated by cardiogenic shock requiring central ECMO cannulation to come off bypass. ETT 11/5-11/9. Ileus with NG for suctioning; clamped 11/10. SLP swallow eval permitted 11/10, okay for clear liquid diet if protecting airway. PMHx CAD c/b cardiac arrest s/p PCI to OM1 in 2017, s/p right nephrectomy, CKD stage IIIa, HIV (on HAART), Hogdkin's lymphoma (s/p chemo now in remission)    Assessment / Plan / Recommendation  Clinical Impression  Pt presented with functional swallowing and absence of s/s concerning for aspiration. Oral motor function WNL. Vocal quality normal s/p four-day oral intubation. Pt consumed ice chips and sequential sips of thin water  from a straw  with adequate attention, the appearance of a brisk swallow response, and no s/s of aspiration. Per MD, he may start a clear liquid diet.  SLP will follow briefly to ensure safety/PO toleration. D/W pt and RN. SLP Visit Diagnosis: Dysphagia, unspecified (R13.10)    Aspiration Risk  No limitations    Diet Recommendation   Thin  Medication Administration: Whole meds with liquid    Other  Recommendations Oral Care Recommendations: Oral care BID     Assistance Recommended at Discharge    Functional Status Assessment Patient has had a recent decline in their functional status and demonstrates the ability to make significant improvements in function in a reasonable and predictable amount of time.  Frequency and Duration min 1 x/week  1 week       Prognosis Prognosis for improved oropharyngeal function: Good      Swallow Study   General Date of Onset: 05/27/24 HPI: Mcadoo Muzquiz is a 43 year old male admitted 05/27/2024 with chest pain and dx of lateral STEMI. Underwent PTCA to ostial LAD, 11/5 CABG x 2 complicated by cardiogenic shock requiring central ECMO cannulation to come off bypass. ETT 11/5-11/9. Ileus with NG for suctioning; clamped 11/10. SLP swallow eval permitted 11/10, okay for clear liquid diet if protecting airway. PMHx CAD c/b cardiac arrest s/p PCI to OM1 in 2017, s/p right nephrectomy, CKD stage IIIa, HIV (on HAART), Hogdkin's lymphoma (s/p chemo now in remission) Type of Study: Bedside Swallow Evaluation Previous Swallow Assessment:  Oct 2015 - normal swallow Diet Prior to this Study: NPO Temperature Spikes Noted: No Respiratory Status: Nasal cannula History of Recent Intubation: Yes Total duration of intubation (days): 4 days Date extubated: 06/01/24 Behavior/Cognition: Alert;Cooperative Oral Cavity Assessment: Within Functional Limits Oral Care Completed by SLP: Yes Oral Cavity - Dentition: Adequate natural dentition Vision: Functional for self-feeding Self-Feeding  Abilities: Able to feed self Patient Positioning: Upright in chair Baseline Vocal Quality: Normal Volitional Cough: Strong Volitional Swallow: Able to elicit    Oral/Motor/Sensory Function Overall Oral Motor/Sensory Function: Within functional limits   Ice Chips Ice chips: Within functional limits   Thin Liquid Thin Liquid: Within functional limits    Nectar Thick Nectar Thick Liquid: Not tested   Honey Thick Honey Thick Liquid: Not tested   Puree Puree: Not tested   Solid     Solid: Not tested      [name] 06/02/2024,10:09 AM Palma L. Vona, MA CCC/SLP Clinical Specialist - Acute Care SLP Acute Rehabilitation Services Office number [phone]

## 2024-06-02 NOTE — Evaluation (Signed)
 Physical Therapy Evaluation Patient Details Name: Thomas [name] MRN: [id number] DOB: [birth year] Today's Date: 06/02/2024  History of Present Illness  43 y/o M presentign to ED on 11/4 with chest pain, found to have lateral STEMI. LHC with occluded mid LAD and OM1 stent, underwent unsucccessful PTCA to astial LAD and IABP placement for stabilization (removed 11/5). Impella placed 11/6. S/p CABG on 11/5 (LIMA-diag, SVG-OM), intrapoerative course notable for vasoplegia, cardioplegia, and inabiltiy to come off bypass due to hypotension, transitioned to [facility] ECMO and was decannulated 11/7.     PMH includes CAD c/b cardiac arrest s/p PCI to OM1, R nephrectomy, CKD IIIA, HIV, Hodgkin's lymphoma s/p chemo now in remission  Clinical Impression  Pt admitted with above diagnosis. Evaluated by PT following the above procedures. Previously independent living with mother. Reviewed sternal precautions and LE exercises today. Required mod assist +2 for bed mobility, min assist +2 to stand and transfer, and Min assist +3 for gait with assist for balance, RW control, and management of multiple lines. Pt motivated, following simple commands consistently with a little delay. Patient will benefit from intensive inpatient follow-up therapy, >3 hours/day.  Pt currently with functional limitations due to the deficits listed below (see PT Problem List). Pt will benefit from acute skilled PT to increase their independence and safety with mobility to allow discharge.           If plan is discharge home, recommend the following: A lot of help with bathing/dressing/bathroom;Assistance with cooking/housework;Two people to help with walking and/or transfers;Direct supervision/assist for medications management;Direct supervision/assist for financial management;Assist for transportation;Help with stairs or ramp for entrance;Supervision due to cognitive status   Can travel by private vehicle        Equipment Recommendations Other  (comment);Rolling walker (2 wheels) (TBD)  Recommendations for Other Services  Rehab consult    Functional Status Assessment Patient has had a recent decline in their functional status and demonstrates the ability to make significant improvements in function in a reasonable and predictable amount of time.     Precautions / Restrictions Precautions Precautions: Fall;Sternal Precaution Booklet Issued: No (Verbally reviewed) Recall of Precautions/Restrictions: Impaired Precaution/Restrictions Comments: impella R axilla Restrictions Weight Bearing Restrictions Per Provider Order: Yes RUE Weight Bearing Per Provider Order: Non weight bearing RLE Weight Bearing Per Provider Order: Non weight bearing Other Position/Activity Restrictions: cardiac sternal precautions      Mobility  Bed Mobility Overal bed mobility: Needs Assistance Bed Mobility: Supine to Sit     Supine to sit: Mod assist, +2 for physical assistance, HOB elevated     General bed mobility comments: Mod assist +2 for trunk elevation to sit up in bed due to sternal prec. tactile cues to facilitate LEs off bed and sequence. Dizziness upon sitting but resolved within 1 minute.    Transfers Overall transfer level: Needs assistance Equipment used: Rolling walker (2 wheels), 1 person hand held assist Transfers: Sit to/from Stand, Bed to chair/wheelchair/BSC Sit to Stand: Min assist, +2 physical assistance   Step pivot transfers: Min assist, +2 safety/equipment       General transfer comment: Min assist +2 for boost and balance, hand held support with step pivot transfer from bed to recliner, short shuffled steps, cues for sequencing and foot clearance, asisted to sit in recliner. Progressed from recliner to stand with Min assist +2 for boost, rocks for [employer] Transitions to RW for light UE support. Cues for precautions with each transition, holding heart pillow.    Ambulation/Gait Ambulation/Gait assistance:  Min  assist, +2 safety/equipment Gait Distance (Feet): 44 Feet Assistive device: Rolling walker (2 wheels) Gait Pattern/deviations: Step-through pattern, Decreased stride length, Knees buckling, Shuffle, Staggering right, Drifts right/left, Trunk flexed, Narrow base of support Gait velocity: dec Gait velocity interpretation: <1.31 ft/sec, indicative of household ambulator   General Gait Details: Cues for upright posture, to widen BOS, forward gaze, and proximity to AD. With intermittent LOB towards Rt, leaning primarily in this direction but corrects with min assist for balance and RW control. Requires +3 support for safety, chair follow and equipment management. HR to 137.  Stairs            Wheelchair Mobility     Tilt Bed    Modified Rankin (Stroke Patients Only)       Balance Overall balance assessment: Needs assistance Sitting-balance support: Feet supported Sitting balance-Leahy Scale: Good Sitting balance - Comments: sits unsupported EOB   Standing balance support: Single extremity supported, Reliant on assistive device for balance Standing balance-Leahy Scale: Poor Standing balance comment: reliant on RW/external support                             Pertinent Vitals/Pain Pain Assessment Pain Assessment: Faces Faces Pain Scale: Hurts a little bit Pain Location: generalized with movement. Sternum Pain Descriptors / Indicators: Discomfort Pain Intervention(s): Monitored during session, Repositioned    Home Living Family/patient expects to be discharged to:: Private residence Living Arrangements: Parent Available Help at Discharge: Family;Available PRN/intermittently Type of Home: Apartment Home Access: Level entry       Home Layout: One level        Prior Function Prior Level of Function : Independent/Modified Independent             Mobility Comments: ind ADLs Comments: does not work or drive, interested in gaming, ind PTA      Extremity/Trunk Assessment   Upper Extremity Assessment Upper Extremity Assessment: Generalized weakness    Lower Extremity Assessment Lower Extremity Assessment: Generalized weakness       Communication   Communication Communication: Impaired Factors Affecting Communication: Reduced clarity of speech (low voice)    Cognition Arousal: Alert Behavior During Therapy: Flat affect   PT - Cognitive impairments: No family/caregiver present to determine baseline, Initiation, Sequencing, Problem solving                         Following commands: Intact       Cueing Cueing Techniques: Verbal cues     General Comments General comments (skin integrity, edema, etc.): Imella P-2.  Resting HR 110s, SpO2 94% on RA, MAP 92 via aline. With exertion HR to 130s. Denied dizziness while ambulating.    Exercises General Exercises - Lower Extremity Ankle Circles/Pumps: AROM, Both, 10 reps, Seated Quad Sets: Strengthening, Both, 10 reps, Seated Gluteal Sets: Strengthening, Both, 10 reps, Seated   Assessment/Plan    PT Assessment Patient needs continued PT services  PT Problem List Decreased strength;Decreased range of motion;Decreased activity tolerance;Decreased balance;Decreased coordination;Decreased mobility;Decreased cognition;Decreased knowledge of use of DME;Decreased knowledge of precautions;Cardiopulmonary status limiting activity;Pain       PT Treatment Interventions DME instruction;Gait training;Stair training;Functional mobility training;Therapeutic activities;Therapeutic exercise;Balance training;Neuromuscular re-education;Cognitive remediation;Patient/family education;Modalities    PT Goals (Current goals can be found in the Care Plan section)  Acute Rehab PT Goals Patient Stated Goal: Get well return home PT Goal Formulation: With patient Time For Goal Achievement: 06/16/24 Potential to  Achieve Goals: Good    Frequency Min 3X/week     Co-evaluation  PT/OT/SLP Co-Evaluation/Treatment: Yes Reason for Co-Treatment: Complexity of the patient's impairments (multi-system involvement);Necessary to address cognition/behavior during functional activity;For patient/therapist safety;To address functional/ADL transfers PT goals addressed during session: Mobility/safety with mobility;Proper use of DME;Balance;Strengthening/ROM OT goals addressed during session: ADL's and self-care;Strengthening/ROM       AM-PAC PT 6 Clicks Mobility  Outcome Measure Help needed turning from your back to your side while in a flat bed without using bedrails?: A Little Help needed moving from lying on your back to sitting on the side of a flat bed without using bedrails?: A Lot Help needed moving to and from a bed to a chair (including a wheelchair)?: A Lot Help needed standing up from a chair using your arms (e.g., wheelchair or bedside chair)?: A Lot Help needed to walk in hospital room?: A Lot Help needed climbing 3-5 steps with a railing? : Total 6 Click Score: 12    End of Session   Activity Tolerance: Patient tolerated treatment well Patient left: in chair;with call bell/phone within reach;with chair alarm set;with nursing/sitter in room Nurse Communication: Mobility status;Precautions PT Visit Diagnosis: Unsteadiness on feet (R26.81);Other abnormalities of gait and mobility (R26.89);Muscle weakness (generalized) (M62.81);Difficulty in walking, not elsewhere classified (R26.2);Other symptoms and signs involving the nervous system (R29.898);Pain Pain - part of body:  (sternum)    Time: 9148-9076 PT Time Calculation (min) (ACUTE ONLY): 32 min   Charges:   PT Evaluation $PT Eval High Complexity: 1 High   PT General Charges $$ ACUTE PT VISIT: 1 Visit         Leontine Roads, PT, DPT [facility]  Rehabilitation Services Physical Therapist Office: [phone] Website: [email or web address]   Leontine GORMAN Roads 06/02/2024, 1:26 PM

## 2024-06-02 NOTE — Progress Notes (Signed)
 Patient was unvailable for CPT up on side of bed

## 2024-06-02 NOTE — Progress Notes (Signed)
 Echocardiogram 2D Echocardiogram has been performed.  Damien FALCON SeniorRDCS 06/02/2024, 2:52 PM

## 2024-06-02 NOTE — Evaluation (Signed)
 Occupational Therapy Evaluation Patient Details Name: Thomas [name] MRN: [id number] DOB: 07-31-1980 Today's Date: 06/02/2024   History of Present Illness   43 y/o M presentign to ED on 11/4 with chest pain, found to have lateral STEMI. LHC with occluded mid LAD and OM1 stent, underwent unsucccessful PTCA to astial LAD and IABP placement for stabilization (removed 11/5). Impella placed 11/6. S/p CABG on 11/5 (LIMA-diag, SVG-OM), intrapoerative course notable for vasoplegia, cardioplegia, and inabiltiy to come off bypass due to hypotension, transitioned to [facility] ECMO and was decannulated 11/7.     PMH includes CAD c/b cardiac arrest s/p PCI to OM1, R nephrectomy, CKD IIIA, HIV, Hodgkin's lymphoma s/p chemo now in remission     Clinical Impressions Pt ind at baseline with ADLs/functional mobility, lives with his mother in a first floor apartment. Pt currently with flat affect overall, following commands with incr time. Pt needs up to max A for ADLs, mod +2 for bed mobility and min +2 for transfers with RW and HHA. Pt educated on sternal precautions, will benefit from continued cues/reinforcement in future sessions. Pt presenting with impairments listed below, will follow acutely. Patient will benefit from intensive inpatient follow-up therapy, >3 hours/day to maximize safety/ind with ADL/functional mobility.      If plan is discharge home, recommend the following:   Two people to help with walking and/or transfers;A lot of help with bathing/dressing/bathroom;Assistance with cooking/housework;Direct supervision/assist for medications management;Direct supervision/assist for financial management;Assist for transportation;Help with stairs or ramp for entrance     Functional Status Assessment   Patient has had a recent decline in their functional status and demonstrates the ability to make significant improvements in function in a reasonable and predictable amount of time.     Equipment  Recommendations   BSC/3in1     Recommendations for Other Services   PT consult;Rehab consult     Precautions/Restrictions   Precautions Precautions: Fall;Sternal Precaution/Restrictions Comments: impella L axillary     Mobility Bed Mobility Overal bed mobility: Needs Assistance Bed Mobility: Sidelying to Sit   Sidelying to sit: Mod assist, +2 for physical assistance       General bed mobility comments: assist for trunk elevation to sit up in bed due to sternal prec    Transfers Overall transfer level: Needs assistance Equipment used: Rolling walker (2 wheels) Transfers: Sit to/from Stand Sit to Stand: Min assist, +2 physical assistance                  Balance Overall balance assessment: Needs assistance Sitting-balance support: Feet supported Sitting balance-Leahy Scale: Good Sitting balance - Comments: sits unsupported EOB     Standing balance-Leahy Scale: Poor Standing balance comment: reliant on RW/external support                           ADL either performed or assessed with clinical judgement   ADL Overall ADL's : Needs assistance/impaired Eating/Feeding: Minimal assistance;Sitting   Grooming: Minimal assistance;Sitting;Standing   Upper Body Bathing: Moderate assistance;Sitting;Standing   Lower Body Bathing: Maximal assistance;Sitting/lateral leans;Sit to/from stand   Upper Body Dressing : Moderate assistance;Standing;Sitting   Lower Body Dressing: Maximal assistance;Sit to/from stand;Sitting/lateral leans   Toilet Transfer: Minimal assistance;+2 for safety/equipment;Rolling walker (2 wheels)   Toileting- Clothing Manipulation and Hygiene: Maximal assistance       Functional mobility during ADLs: Minimal assistance;Rolling walker (2 wheels)       Vision   Additional Comments: not formally assessed, but appearing  [facility]     Perception Perception: Not tested       Praxis Praxis: Not tested       Pertinent  Vitals/Pain Pain Assessment Pain Assessment: Faces Pain Score: 2  Faces Pain Scale: Hurts a little bit Pain Location: generalized with movement Pain Descriptors / Indicators: Discomfort Pain Intervention(s): Monitored during session     Extremity/Trunk Assessment Upper Extremity Assessment Upper Extremity Assessment: Generalized weakness   Lower Extremity Assessment Lower Extremity Assessment: Defer to PT evaluation       Communication Communication Communication: No apparent difficulties   Cognition Arousal: Alert Behavior During Therapy: Flat affect Cognition: Cognition impaired   Orientation impairments: Situation, Time (States it is July initially, able to conclude it is Nov with cues) Awareness: Online awareness impaired Memory impairment (select all impairments): Short-term memory     OT - Cognition Comments: follows commands during session and provides PLOF appropriately, overall flat affect                 Following commands: Intact       Cueing  General Comments   Cueing Techniques: Verbal cues  VSS   Exercises     Shoulder Instructions      Home Living Family/patient expects to be discharged to:: Private residence Living Arrangements: Parent Available Help at Discharge: Family;Available PRN/intermittently Type of Home: Apartment Home Access: Level entry     Home Layout: One level     Bathroom Shower/Tub: Tub/shower unit                    Prior Functioning/Environment Prior Level of Function : Independent/Modified Independent             Mobility Comments: ind ADLs Comments: does not work or drive, interested in gaming, ind PTA    OT Problem List: Decreased strength;Decreased range of motion;Decreased activity tolerance;Impaired balance (sitting and/or standing);Decreased cognition;Decreased safety awareness;Cardiopulmonary status limiting activity;Decreased knowledge of precautions   OT Treatment/Interventions:  Self-care/ADL training;Therapeutic exercise;Energy conservation;DME and/or AE instruction;Therapeutic activities;Patient/family education;Balance training      OT Goals(Current goals can be found in the care plan section)   Acute Rehab OT Goals Patient Stated Goal: none stated OT Goal Formulation: With patient Time For Goal Achievement: 06/16/24 Potential to Achieve Goals: Good ADL Goals Pt Will Perform Grooming: with modified independence;sitting Pt Will Perform Upper Body Dressing: with modified independence;sitting Pt Will Perform Lower Body Dressing: with modified independence;sitting/lateral leans;sit to/from stand Pt Will Transfer to Toilet: with modified independence;regular height toilet;ambulating Additional ADL Goal #1: pt will recall sternal precautions with min cues in prep for ADLs   OT Frequency:  Min 2X/week    Co-evaluation PT/OT/SLP Co-Evaluation/Treatment: Yes Reason for Co-Treatment: Complexity of the patient's impairments (multi-system involvement);Necessary to address cognition/behavior during functional activity;For patient/therapist safety;To address functional/ADL transfers   OT goals addressed during session: ADL's and self-care;Strengthening/ROM      AM-PAC OT 6 Clicks Daily Activity     Outcome Measure Help from another person eating meals?: A Little Help from another person taking care of personal grooming?: A Little Help from another person toileting, which includes using toliet, bedpan, or urinal?: A Lot Help from another person bathing (including washing, rinsing, drying)?: A Lot Help from another person to put on and taking off regular upper body clothing?: A Lot Help from another person to put on and taking off regular lower body clothing?: A Lot 6 Click Score: 14   End of Session Equipment Utilized During Treatment: Rolling walker (  2 wheels) Nurse Communication: Mobility status  Activity Tolerance: Patient tolerated treatment well Patient  left: in chair;with call bell/phone within reach;with nursing/sitter in room  OT Visit Diagnosis: Unsteadiness on feet (R26.81);Other abnormalities of gait and mobility (R26.89);Muscle weakness (generalized) (M62.81)                Time: 9149-9076 OT Time Calculation (min): 33 min Charges:  OT General Charges $OT Visit: 1 Visit OT Evaluation $OT Eval Moderate Complexity: 1 Mod  Dray Dente K, OTD, OTR/L SecureChat Preferred Acute Rehab [phone]   Shantavia Jha K Koonce 06/02/2024, 10:39 AM

## 2024-06-02 NOTE — Progress Notes (Signed)
 PHARMACY - ANTICOAGULATION CONSULT NOTE  Pharmacy Consult for heparin  Indication: Impella 5.5   Allergies  Allergen Reactions   Tylenol  [Acetaminophen ] Other (See Comments)    Cold sweats    Patient Measurements: Height: 6' 1 (185.4 cm) Weight: 88.4 kg (194 lb 14.2 oz) IBW/kg (Calculated) : 79.9 HEPARIN  DW (KG): 94.8  Vital Signs: Temp: 97.7 F (36.5 C) (11/10 0746) BP: 103/77 (11/10 0746) Pulse Rate: 109 (11/10 0746)  Labs: Recent Labs    05/31/24 0403 05/31/24 0628 06/01/24 0423 06/01/24 1503 06/01/24 1505 06/02/24 0333  HGB 8.4*   < > 7.6* 10.5* 10.0* 9.4*  HCT 24.2*   < > 22.4* 31.0* 29.6* 28.1*  PLT 70*   < > 83*  --  109* 102*  LABPROT 16.3*  --  16.0*  --   --  15.7*  INR 1.2  --  1.2  --   --  1.2  HEPARINUNFRC  --   --  <0.10*  --   --  <0.10*  CREATININE 1.45*   < > 1.46*  --  1.64* 1.60*   < > = values in this interval not displayed.    Estimated Creatinine Clearance: 68.7 mL/min (A) (by C-G formula based on SCr of 1.6 mg/dL (H)).    Assessment: 43yo male presented with chest pain and found to have STEMI. IABP placed 11/4>CABG 11/5 unstable coming off pump > central ECMO cannulation > impella 5.5 placed 11/6> decannulated 11/7.   On fixed rate heparin  infusion at 500 units/hr with Impella 5.5 (running stable at P4). Hgb 9.4, plt 102. Fibrinogen  >800, LDH 309. No s/sx of bleeding or infusion issues. Remains on bicarb purge solution.   Goal of Therapy:  Heparin  level 0.2-0.5 units/ml Monitor platelets by anticoagulation protocol: Yes  Plan:  Continue heparin  infusion at 500 units/hr - no plans for titration today, plan for possible impella removal 11/11 Daily heparin  level and CBC  Monitor s/sx of bleeding   Thank you for allowing pharmacy to participate in this patient's care,  Suzen Sour, PharmD, BCCCP Clinical Pharmacist  Phone: [phone] 06/02/2024 10:01 AM  Please check AMION for all [facility] phone numbers After 10:00 PM, call  Main Pharmacy [phone]

## 2024-06-02 NOTE — Progress Notes (Addendum)
 Advanced Heart Failure Rounding Note  Cardiologist: None  Chief Complaint: STEMI Subjective:   11[facility] for failure to wean after cardiac bypass 11/6 Impella 5.5 placement 11/7 ECMO decannulation 11/9 Extubated  Diuresed very well with IV lasix yesterday. CVP <5 this morning. Co-ox 66% on 0.25 milrinone.   Feels ok just anxious.   Objective:   Weight Range: 88.4 kg Body mass index is 25.71 kg/m.   Vital Signs:   Temp:  [97.3 F (36.3 C)-99.9 F (37.7 C)] 97.9 F (36.6 C) (11/10 0830) Pulse Rate:  [104-159] 119 (11/10 0830) Resp:  [13-34] 22 (11/10 0830) BP: (80-122)/(50-103) 114/80 (11/10 0830) SpO2:  [89 %-100 %] 95 % (11/10 0830) Arterial Line BP: (85-164)/(53-91) 121/73 (11/10 0830) FiO2 (%):  [40 %] 40 % (11/09 2339) Weight:  [88.4 kg] 88.4 kg (11/10 0500) Last BM Date : 06/01/24  Weight change: Filed Weights   05/31/24 0418 06/01/24 0604 06/02/24 0500  Weight: 93.9 kg 93 kg 88.4 kg    Intake/Output:   Intake/Output Summary (Last 24 hours) at 06/02/2024 0930 Last data filed at 06/02/2024 0855 Gross per 24 hour  Intake 2201.3 ml  Output 7990 ml  Net -5788.7 ml      Physical Exam  General:  anxious appearing.  No respiratory difficulty (using flutter valve) Neck: JVD ~5 cm.  Cor: Regular rate & rhythm. No murmurs. Lungs: clear Extremities: no edema  Neuro: alert & oriented x 3. Affect pleasant.   Tele review: ST low 100s  Medications:     Scheduled Medications:  acetaminophen   1,000 mg Oral Q6H   Or   acetaminophen  (TYLENOL ) oral liquid 160 mg/5 mL  1,000 mg Per Tube Q6H   aspirin  EC  325 mg Oral Daily   Or   aspirin   324 mg Per Tube Daily   atorvastatin   40 mg Per Tube Daily   bisacodyl   10 mg Oral Daily   Or   bisacodyl   10 mg Rectal Daily   Chlorhexidine  Gluconate Cloth  6 each Topical Daily   docusate  100 mg Per Tube BID   insulin  aspart  0-24 Units Subcutaneous Q4H   lidocaine   1 patch Transdermal Q24H    metoCLOPramide (REGLAN) injection  10 mg Intravenous Q8H   pantoprazole  (PROTONIX ) IV  40 mg Intravenous QHS   polyethylene glycol  17 g Per Tube Daily   QUEtiapine  25 mg Per Tube Daily   sodium chloride  flush  10-40 mL Intracatheter Q12H   sodium chloride  flush  10-40 mL Intracatheter Q12H   sodium chloride  flush  3 mL Intravenous Q12H    Infusions:  heparin  500 Units/hr (06/02/24 0800)   milrinone 0.125 mcg/kg/min (06/02/24 0800)   piperacillin -tazobactam (ZOSYN )  IV 12.5 mL/hr at 06/02/24 0800   potassium chloride  10 mEq (06/02/24 0815)   sodium bicarbonate  25 mEq (Impella PURGE) in dextrose  5 % 1000 mL bag      PRN Medications: haloperidol lactate, morphine  injection, ondansetron  (ZOFRAN ) IV, mouth rinse, oxyCODONE , sodium chloride  flush, sodium chloride  flush, sodium chloride  flush, traMADol    Patient Profile   Thomas [name] is a 43 y.o. male with a PMH of CAD s/p PCI to the OM1 in 2017, chronic angina, right nephrectomy, CKD stage IIIa, HIV, hodgkin's lymphoma treated with chemotherapy who presents with STEMI. Course complicated by post cardiotomy shock and subsequent central cannulation for ECMO.  Assessment/Plan   Post cardiotomy shock:  - Cannulated 11/5 for central ECMO - EF in  the lab (unloaded) around 25-30% with anterior and inferior akinesis - IABP removed 11/5 - Impella 5.5 placed 11/6 - Decannulated 11/7 - Extubated 11/9, doing well. Impella at P5, 2.8L/min - Co-ox 66%. Wean milrinone to 0.25>0.125 today - Continue flat dose heparin , LDH slightly up today, no evidence of hemolysis - Volume stable, CVP <5. Will hold off on further diuretics today. Responds briskly to IV lasix.  - Off vanc with closed chest, continue zosyn   CAD/STEMI: Initial EKG with lateral STEMI, changes and chest pain improved in the ED but given dynamic changes taking to the cath lab. - S/p POBA to the ostial LAD - Poor stent landing zone, multivessel CAD, reduced EF - Poor CABG  targets, s/p LIMA-D1, SVG-OM - plavix off with MCS, start prior to discharge. Aspirin  325mg  daily for now - Statin on hold with shock  Acute systolic heart failure: - Due to ischemia - Management of shock as above  HIV:  - Continue home meds  AKI on CKD: Prior right nephrectomy due to complications from kidney stone, chronic hydronephrosis. Creatinine improving with support - due to shock, ATN - Renal function today post diuresis. Will hold further diuretics today.   Remove a line and swann/sheath today.   CRITICAL CARE Performed by: Beckey LITTIE Coe   Total critical care time: 18 minutes  Critical care time was exclusive of separately billable procedures and treating other patients.  Critical care was necessary to treat or prevent imminent or life-threatening deterioration.  Critical care was time spent personally by me on the following activities: development of treatment plan with patient and/or surrogate as well as nursing, discussions with consultants, evaluation of patient's response to treatment, examination of patient, obtaining history from patient or surrogate, ordering and performing treatments and interventions, ordering and review of laboratory studies, ordering and review of radiographic studies, pulse oximetry and re-evaluation of patient's condition.

## 2024-06-02 NOTE — Progress Notes (Signed)
 [address]       [place] [address]             [phone]                 3 Days Post-Op Procedure(s) (LRB): DECANNULATION FOR  ECMO (EXTRACORPOREAL MEMBRANE OXYGENATION) (N/A)   Events: No events _______________________________________________________________ Vitals: BP 103/77   Pulse (!) 109   Temp 97.7 F (36.5 C)   Resp (!) 22   Ht 6' 1 (1.854 m)   Wt 88.4 kg   SpO2 95%   BMI 25.71 kg/m  Filed Weights   05/31/24 0418 06/01/24 0604 06/02/24 0500  Weight: 93.9 kg 93 kg 88.4 kg     - Neuro: sedated  - Cardiovascular: sinus  Drips:milr 0.25 Impella P4 2.2L .   PAP: (13-36)/(5-24) 36/21 CVP:  [0 mmHg-16 mmHg] 13 mmHg CO:  [5.6 L/min-7.6 L/min] 5.7 L/min CI:  [2.57 L/min/m2-3.5 L/min/m2] 2.63 L/min/m2  - Pulm:  FiO2 (%):  [40 %] 40 %  ABG    Component Value Date/Time   PHART 7.438 06/01/2024 1503   PCO2ART 30.8 (L) 06/01/2024 1503   PO2ART 78 (L) 06/01/2024 1503   HCO3 20.7 06/01/2024 1503   TCO2 22 06/01/2024 1503   ACIDBASEDEF 3.0 (H) 06/01/2024 1503   O2SAT 65.7 06/02/2024 0518    - Abd: ND - Extremity: warm  .Intake/Output      11/09 0701 11/10 0700 11/10 0701 11/11 0700   I.V. (mL/kg) 1240.7 (14)    Other 233.7    NG/GT 200    IV Piggyback 766.1    Total Intake(mL/kg) 2440.5 (27.6)    Urine (mL/kg/hr) 7085 (3.3)    Emesis/NG output 1600    Stool 0    Chest Tube     Total Output 8685    Net -6244.5         Stool Occurrence 1 x       _______________________________________________________________ Labs:    Latest Ref Rng & Units 06/02/2024    3:33 AM 06/01/2024    3:05 PM 06/01/2024    3:03 PM  CBC  WBC 4.0 - 10.5 K/uL 8.1  10.1    Hemoglobin 13.0 - 17.0 g/dL 9.4  89.9  89.4   Hematocrit 39.0 - 52.0 % 28.1  29.6  31.0   Platelets 150 - 400 K/uL 102  109        Latest Ref Rng & Units 06/02/2024    3:33 AM 06/01/2024    3:05 PM 06/01/2024    3:03 PM  CMP  Glucose 70 - 99 mg/dL 899  891    BUN 6  - 20 mg/dL 12  11    Creatinine 9.38 - 1.24 mg/dL 8.39  8.35    Sodium 864 - 145 mmol/L 140  140  140   Potassium 3.5 - 5.1 mmol/L 3.7  4.0  3.8   Chloride 98 - 111 mmol/L 106  102    CO2 22 - 32 mmol/L 23  21    Calcium  8.9 - 10.3 mg/dL 9.1  9.1    Total Protein 6.5 - 8.1 g/dL 7.3     Total Bilirubin 0.0 - 1.2 mg/dL 3.1     Alkaline Phos 38 - 126 U/L 47     AST 15 - 41 U/L 39     ALT 0 - 44 U/L 21       CXR: -  _______________________________________________________________  Assessment and Plan:  POD 5 s/p CABG, [facility], POD4 s/p 5.5 impella placement  Neuro: pain controlled CV: will plan to remove impella tomorrow Pulm: pulm hygiene Renal: creat stable GI: npo.  Will need speech eval Heme: stable ID: [email or web address] Endo: SSI Dispo: continue ICU care   Thomas [name] O Quentin Strebel 06/02/2024 8:07 AM

## 2024-06-02 NOTE — Progress Notes (Signed)
 Nutrition Follow-up  DOCUMENTATION CODES:   Non-severe (moderate) malnutrition in context of acute illness/injury  INTERVENTION:   No nutrition x 6 days. If pt does not tolerate trial of po intake, recommend initiation of nutrition support. Recommend considering post-pyloric Cortrak placement with trial of trickle TF vs initiation of TPN. If pt tolerates CL diet and able to remove NG, plan to opimize oral nutrition with liberalized diet, addition of oral nutrition supplements, vitamins etc.   Ensure Plus High Protein po TID once diet advanced past CL,  each supplement provides 350 kcal and 20 grams of protein  NUTRITION DIAGNOSIS:   Moderate Malnutrition related to acute illness as evidenced by mild fat depletion, mild muscle depletion, energy intake < or equal to 50% for > or equal to 5 days.  Continues  GOAL:   Patient will meet greater than or equal to 90% of their needs   Not Met  MONITOR:   Diet advancement, PO intake, Labs, Weight trends, I & O's  REASON FOR ASSESSMENT:   Consult Assessment of nutrition requirement/status (ECMO pt)  ASSESSMENT:   Pt with hx of CAD (cardiac arrest 2017), neuropathy (s/p nephrectomy), CKD IIIa, HIV, and Hodgkin's lymphoma (s/p chemo, now in remission). Admitted with chest pain, diagnosed STEMI.  11/04 Admitted 11/05 CABG x 2[facility] cann[facility], open chest, Levo/Vaso/Epi plus Milrinone 11/[facility] decannulation, increased inotropes, bronch: +mucous plug entire right lung, Weaned off Levophed  11/09 Extubated, high NG output, Weaned off Vasopressin , Epinephrine   Impella 5.5 remains place, noted plan for removal tomorrow  NG tube with 1600 mL in 24 hours, +BM x 1  Noted plan to clamp NG today, trial of CL diet today. Pt thirsty on visit today. Pt drinking water , taking some jello and italian ice. Tolerating CL thus far. Per PCCM, if pt tolerates CL with NG clamped, plan for NG removal later today, diet advancement to FL  No  nutrition x 6 days  UOP 7 L in 24 hours Weight down to 88.4 kg (195 pounds) this AM post significant diuresis since yesterday. Wt yesterday 93 kg Pt reports UBW around 207 pounds  Labs: CBGs 90-119 (acceptable range) Sodium 140 (wdl) Potassium 3.7 (wdl) Magnesium  2.8 (H) Phosphorus 2.7 (wdl)  Meds: Colace BID SS novolog IV reglan  Miralax   NUTRITION - FOCUSED PHYSICAL EXAM:  Flowsheet Row Most Recent Value  Orbital Region Mild depletion  Upper Arm Region No depletion  Thoracic and Lumbar Region No depletion  Buccal Region Mild depletion  Temple Region Mild depletion  Clavicle Bone Region Mild depletion  Clavicle and Acromion Bone Region Mild depletion  Scapular Bone Region Mild depletion  Dorsal Hand Mild depletion  Patellar Region Unable to assess  Anterior Thigh Region Unable to assess  Posterior Calf Region Moderate depletion  Edema (RD Assessment) Mild  [non pitting, BLE, bilateral hands]  Hair Reviewed  Eyes Unable to assess  Mouth Unable to assess  Skin Reviewed  Nails Reviewed    Diet Order:   Diet Order             Diet clear liquid Room service appropriate? Yes; Fluid consistency: Thin  Diet effective now                   EDUCATION NEEDS:   Not appropriate for education at this time  Skin:  Skin Assessment: Skin Integrity Issues: Skin Integrity Issues:: Incisions Incisions: R leg incision closed; closed chest incision  Last BM:  11/10 +large BM  Height:  Ht Readings from Last 1 Encounters:  05/27/24 6' 1 (1.854 m)    Weight:   Wt Readings from Last 1 Encounters:  06/02/24 88.4 kg    Ideal Body Weight:  83.64 kg  BMI:  Body mass index is 25.71 kg/m.  Estimated Nutritional Needs:   Kcal:  2300-2500  Protein:  125-150 g  Fluid:  > 2L    Betsey Finger MS, RDN, LDN, CNSC Registered Dietitian 3 Clinical Nutrition RD Inpatient Contact Info in [place]

## 2024-06-03 ENCOUNTER — Inpatient Hospital Stay (HOSPITAL_COMMUNITY)

## 2024-06-03 ENCOUNTER — Encounter (HOSPITAL_COMMUNITY): Payer: Self-pay | Admitting: Thoracic Surgery (Cardiothoracic Vascular Surgery)

## 2024-06-03 ENCOUNTER — Inpatient Hospital Stay (HOSPITAL_COMMUNITY): Admitting: Certified Registered Nurse Anesthetist

## 2024-06-03 ENCOUNTER — Encounter (HOSPITAL_COMMUNITY)
Admission: EM | Disposition: A | Payer: Self-pay | Source: Home / Self Care | Attending: Thoracic Surgery (Cardiothoracic Vascular Surgery)

## 2024-06-03 DIAGNOSIS — Z87891 Personal history of nicotine dependence: Secondary | ICD-10-CM | POA: Diagnosis not present

## 2024-06-03 DIAGNOSIS — T8111XA Postprocedural  cardiogenic shock, initial encounter: Secondary | ICD-10-CM

## 2024-06-03 DIAGNOSIS — I213 ST elevation (STEMI) myocardial infarction of unspecified site: Secondary | ICD-10-CM | POA: Diagnosis not present

## 2024-06-03 DIAGNOSIS — N179 Acute kidney failure, unspecified: Secondary | ICD-10-CM | POA: Diagnosis not present

## 2024-06-03 DIAGNOSIS — J189 Pneumonia, unspecified organism: Secondary | ICD-10-CM | POA: Diagnosis not present

## 2024-06-03 DIAGNOSIS — J9601 Acute respiratory failure with hypoxia: Secondary | ICD-10-CM

## 2024-06-03 DIAGNOSIS — I5021 Acute systolic (congestive) heart failure: Secondary | ICD-10-CM | POA: Diagnosis not present

## 2024-06-03 DIAGNOSIS — Z4509 Encounter for adjustment and management of other cardiac device: Secondary | ICD-10-CM

## 2024-06-03 DIAGNOSIS — E440 Moderate protein-calorie malnutrition: Secondary | ICD-10-CM

## 2024-06-03 DIAGNOSIS — D509 Iron deficiency anemia, unspecified: Secondary | ICD-10-CM | POA: Diagnosis not present

## 2024-06-03 DIAGNOSIS — Z951 Presence of aortocoronary bypass graft: Secondary | ICD-10-CM | POA: Diagnosis not present

## 2024-06-03 DIAGNOSIS — R570 Cardiogenic shock: Secondary | ICD-10-CM | POA: Diagnosis not present

## 2024-06-03 DIAGNOSIS — I2102 ST elevation (STEMI) myocardial infarction involving left anterior descending coronary artery: Secondary | ICD-10-CM | POA: Diagnosis not present

## 2024-06-03 HISTORY — PX: REMOVAL OF IMPELLA LEFT VENTRICULAR ASSIST DEVICE: SHX6556

## 2024-06-03 HISTORY — PX: INTRAOPERATIVE TRANSESOPHAGEAL ECHOCARDIOGRAM: SHX5062

## 2024-06-03 LAB — TYPE AND SCREEN
ABO/RH(D): O POS
Antibody Screen: NEGATIVE
Unit division: 0
Unit division: 0
Unit division: 0
Unit division: 0
Unit division: 0
Unit division: 0

## 2024-06-03 LAB — COOXEMETRY PANEL
Carboxyhemoglobin: 2.1 % — ABNORMAL HIGH (ref 0.5–1.5)
Carboxyhemoglobin: 2.2 % — ABNORMAL HIGH (ref 0.5–1.5)
Methemoglobin: <0.7 % (ref 0.0–1.5)
Methemoglobin: 0.9 % (ref 0.0–1.5)
O2 Saturation: 58.4 %
O2 Saturation: 55 %
Total hemoglobin: 10.5 g/dL — ABNORMAL LOW (ref 12.0–16.0)
Total hemoglobin: 9.3 g/dL — ABNORMAL LOW (ref 12.0–16.0)

## 2024-06-03 LAB — BPAM RBC
Blood Product Expiration Date: 202512032359
Blood Product Expiration Date: 202512032359
Blood Product Expiration Date: 202512032359
Blood Product Expiration Date: 202512032359
Blood Product Expiration Date: 202512032359
Blood Product Expiration Date: 202512032359
ISSUE DATE / TIME: 202511071248
ISSUE DATE / TIME: 202511071248
ISSUE DATE / TIME: 202511071248
ISSUE DATE / TIME: 202511081126
Unit Type and Rh: 5100
Unit Type and Rh: 5100
Unit Type and Rh: 5100
Unit Type and Rh: 5100
Unit Type and Rh: 5100
Unit Type and Rh: 5100

## 2024-06-03 LAB — BASIC METABOLIC PANEL WITH GFR
Anion gap: 15 (ref 5–15)
BUN: 13 mg/dL (ref 6–20)
CO2: 22 mmol/L (ref 22–32)
Calcium: 8.8 mg/dL — ABNORMAL LOW (ref 8.9–10.3)
Chloride: 104 mmol/L (ref 98–111)
Creatinine, Ser: 1.7 mg/dL — ABNORMAL HIGH (ref 0.61–1.24)
GFR, Estimated: 51 mL/min — ABNORMAL LOW (ref >60)
Glucose, Bld: 114 mg/dL — ABNORMAL HIGH (ref 70–99)
Potassium: 3.3 mmol/L — ABNORMAL LOW (ref 3.5–5.1)
Sodium: 141 mmol/L (ref 135–145)

## 2024-06-03 LAB — POTASSIUM: Potassium: 4.2 mmol/L (ref 3.5–5.1)

## 2024-06-03 LAB — GLUCOSE, CAPILLARY
Glucose-Capillary: 116 mg/dL — ABNORMAL HIGH (ref 70–99)
Glucose-Capillary: 103 mg/dL — ABNORMAL HIGH (ref 70–99)
Glucose-Capillary: 104 mg/dL — ABNORMAL HIGH (ref 70–99)
Glucose-Capillary: 102 mg/dL — ABNORMAL HIGH (ref 70–99)
Glucose-Capillary: 86 mg/dL (ref 70–99)
Glucose-Capillary: 82 mg/dL (ref 70–99)

## 2024-06-03 LAB — CBC
HCT: 27.8 % — ABNORMAL LOW (ref 39.0–52.0)
Hemoglobin: 9.2 g/dL — ABNORMAL LOW (ref 13.0–17.0)
MCH: 31.5 pg (ref 26.0–34.0)
MCHC: 33.1 g/dL (ref 30.0–36.0)
MCV: 95.2 fL (ref 80.0–100.0)
Platelets: 145 K/uL — ABNORMAL LOW (ref 150–400)
RBC: 2.92 MIL/uL — ABNORMAL LOW (ref 4.22–5.81)
RDW: 15.2 % (ref 11.5–15.5)
WBC: 7 K/uL (ref 4.0–10.5)
nRBC: 0.3 % — ABNORMAL HIGH (ref 0.0–0.2)

## 2024-06-03 LAB — PREPARE RBC (CROSSMATCH)

## 2024-06-03 LAB — PROTIME-INR
INR: 1.2 (ref 0.8–1.2)
Prothrombin Time: 15.4 s — ABNORMAL HIGH (ref 11.4–15.2)

## 2024-06-03 LAB — HEPATIC FUNCTION PANEL
ALT: 62 U/L — ABNORMAL HIGH (ref 0–44)
AST: 86 U/L — ABNORMAL HIGH (ref 15–41)
Albumin: 3 g/dL — ABNORMAL LOW (ref 3.5–5.0)
Alkaline Phosphatase: 72 U/L (ref 38–126)
Bilirubin, Direct: 0.8 mg/dL — ABNORMAL HIGH (ref 0.0–0.2)
Indirect Bilirubin: 1.2 mg/dL — ABNORMAL HIGH (ref 0.3–0.9)
Total Bilirubin: 2 mg/dL — ABNORMAL HIGH (ref 0.0–1.2)
Total Protein: 7.4 g/dL (ref 6.5–8.1)

## 2024-06-03 LAB — MAGNESIUM: Magnesium: 2.3 mg/dL (ref 1.7–2.4)

## 2024-06-03 LAB — FIBRINOGEN: Fibrinogen: >800 mg/dL — ABNORMAL HIGH (ref 210–475)

## 2024-06-03 LAB — HEPARIN LEVEL (UNFRACTIONATED): Heparin Unfractionated: <0.1 [IU]/mL — ABNORMAL LOW (ref 0.30–0.70)

## 2024-06-03 LAB — ECHO INTRAOPERATIVE TEE
Height: 73 in
Weight: 3132.3 [oz_av]

## 2024-06-03 LAB — LACTATE DEHYDROGENASE: LDH: 338 U/L — ABNORMAL HIGH (ref 105–235)

## 2024-06-03 SURGERY — REMOVAL, CARDIAC ASSIST DEVICE, IMPELLA
Anesthesia: General

## 2024-06-03 MED ORDER — LACTATED RINGERS IV BOLUS
250.0000 mL | Freq: Once | INTRAVENOUS | Status: AC
  Administered 2024-06-03: 250 mL via INTRAVENOUS

## 2024-06-03 MED ORDER — ONDANSETRON HCL 4 MG/2ML IJ SOLN
INTRAMUSCULAR | Status: DC | PRN
  Administered 2024-06-03: 4 mg via INTRAVENOUS

## 2024-06-03 MED ORDER — PROPOFOL 10 MG/ML IV BOLUS
INTRAVENOUS | Status: AC
  Filled 2024-06-03: qty 20

## 2024-06-03 MED ORDER — CHLORHEXIDINE GLUCONATE CLOTH 2 % EX PADS
6.0000 | MEDICATED_PAD | Freq: Once | CUTANEOUS | Status: AC
  Administered 2024-06-03: 6 via TOPICAL

## 2024-06-03 MED ORDER — ONDANSETRON HCL 4 MG/2ML IJ SOLN
INTRAMUSCULAR | Status: AC
  Filled 2024-06-03: qty 2

## 2024-06-03 MED ORDER — MIDAZOLAM HCL 5 MG/5ML IJ SOLN
INTRAMUSCULAR | Status: DC | PRN
  Administered 2024-06-03: 2 mg via INTRAVENOUS

## 2024-06-03 MED ORDER — SODIUM CHLORIDE 0.9% IV SOLUTION: Freq: Once | INTRAVENOUS | Status: AC

## 2024-06-03 MED ORDER — FENTANYL CITRATE (PF) 250 MCG/5ML IJ SOLN
INTRAMUSCULAR | Status: AC
  Filled 2024-06-03: qty 5

## 2024-06-03 MED ORDER — ROCURONIUM BROMIDE 10 MG/ML (PF) SYRINGE
PREFILLED_SYRINGE | INTRAVENOUS | Status: DC | PRN
  Administered 2024-06-03: 80 mg via INTRAVENOUS

## 2024-06-03 MED ORDER — 0.9 % SODIUM CHLORIDE (POUR BTL) OPTIME
TOPICAL | Status: DC | PRN
  Administered 2024-06-03: 2000 mL

## 2024-06-03 MED ORDER — SUGAMMADEX SODIUM 200 MG/2ML IV SOLN
INTRAVENOUS | Status: DC | PRN
Start: 2024-06-03 — End: 2024-06-03
  Administered 2024-06-03: 200 mg via INTRAVENOUS

## 2024-06-03 MED ORDER — LIDOCAINE 2% (20 MG/ML) 5 ML SYRINGE
INTRAMUSCULAR | Status: DC | PRN
  Administered 2024-06-03: 100 mg via INTRAVENOUS

## 2024-06-03 MED ORDER — LACTATED RINGERS IV SOLN: INTRAVENOUS | Status: DC | PRN

## 2024-06-03 MED ORDER — DEXAMETHASONE SOD PHOSPHATE PF 10 MG/ML IJ SOLN
INTRAMUSCULAR | Status: DC | PRN
  Administered 2024-06-03: 5 mg via INTRAVENOUS

## 2024-06-03 MED ORDER — POTASSIUM CHLORIDE CRYS ER 20 MEQ PO TBCR
40.0000 meq | EXTENDED_RELEASE_TABLET | Freq: Once | ORAL | Status: AC
  Administered 2024-06-03: 40 meq via ORAL
  Filled 2024-06-03: qty 2

## 2024-06-03 MED ORDER — PROPOFOL 10 MG/ML IV BOLUS
INTRAVENOUS | Status: DC | PRN
  Administered 2024-06-03: 50 mg via INTRAVENOUS

## 2024-06-03 MED ORDER — POTASSIUM CHLORIDE 10 MEQ/50ML IV SOLN
10.0000 meq | INTRAVENOUS | Status: AC
  Administered 2024-06-03 (×3): 10 meq via INTRAVENOUS
  Filled 2024-06-03 (×3): qty 50

## 2024-06-03 MED ORDER — FENTANYL CITRATE (PF) 250 MCG/5ML IJ SOLN
INTRAMUSCULAR | Status: DC | PRN
Start: 2024-06-03 — End: 2024-06-03
  Administered 2024-06-03: 100 ug via INTRAVENOUS
  Administered 2024-06-03 (×3): 50 ug via INTRAVENOUS

## 2024-06-03 MED ORDER — MIDAZOLAM HCL 2 MG/2ML IJ SOLN
INTRAMUSCULAR | Status: AC
  Filled 2024-06-03: qty 2

## 2024-06-03 MED ORDER — CHLORHEXIDINE GLUCONATE 0.12 % MT SOLN
15.0000 mL | Freq: Once | OROMUCOSAL | Status: AC
  Administered 2024-06-03: 15 mL via OROMUCOSAL
  Filled 2024-06-03: qty 15

## 2024-06-03 MED ORDER — HEPARIN 6000 UNIT IRRIGATION SOLUTION
Status: DC | PRN
  Administered 2024-06-03: 1

## 2024-06-03 MED ORDER — PHENYLEPHRINE 80 MCG/ML (10ML) SYRINGE FOR IV PUSH (FOR BLOOD PRESSURE SUPPORT)
PREFILLED_SYRINGE | INTRAVENOUS | Status: DC | PRN
  Administered 2024-06-03: 160 ug via INTRAVENOUS
  Administered 2024-06-03 (×3): 80 ug via INTRAVENOUS

## 2024-06-03 MED FILL — Heparin Sodium (Porcine) Inj 1000 Unit/ML: Qty: 1000 | Status: AC

## 2024-06-03 MED FILL — Potassium Chloride Inj 2 mEq/ML: INTRAVENOUS | Qty: 40 | Status: AC

## 2024-06-03 MED FILL — Lidocaine HCl Local Preservative Free (PF) Inj 2%: INTRAMUSCULAR | Qty: 14 | Status: AC

## 2024-06-03 SURGICAL SUPPLY — 38 items
APPLICATOR PREVELEAK SEALANT (TIP) ×1 IMPLANT
APPLICATOR TIP COSEAL (VASCULAR PRODUCTS) IMPLANT
BLADE CLIPPER SURG (BLADE) ×1 IMPLANT
CATH DIAG EXPO 6F AL1 (CATHETERS) ×1 IMPLANT
CATH INFINITI 6F MPB2 (CATHETERS) ×1 IMPLANT
CATH ROBINSON RED A/P 14FR (CATHETERS) IMPLANT
CONTAINER PROTECT SURGISLUSH (MISCELLANEOUS) ×1 IMPLANT
DRAPE C-ARM 42X72 X-RAY (DRAPES) ×2 IMPLANT
DRAPE CHEST BREAST 15X10 FENES (DRAPES) IMPLANT
DRAPE CV SPLIT W-CLR ANES SCRN (DRAPES) ×1 IMPLANT
DRAPE PERI GROIN 82X75IN TIB (DRAPES) ×1 IMPLANT
DRAPE WARM FLUID 44X44 (DRAPES) ×1 IMPLANT
GAUZE 4X4 16PLY ~~LOC~~+RFID DBL (SPONGE) ×1 IMPLANT
GAUZE SPONGE 4X4 12PLY STRL (GAUZE/BANDAGES/DRESSINGS) IMPLANT
GOWN STRL REUS W/ TWL LRG LVL3 (GOWN DISPOSABLE) ×4 IMPLANT
INSERT FOGARTY SM (MISCELLANEOUS) ×1 IMPLANT
KIT BASIN OR (CUSTOM PROCEDURE TRAY) ×1 IMPLANT
LOOP VASCLR MAXI BLUE 18IN ST (MISCELLANEOUS) ×1 IMPLANT
PACK CHEST (CUSTOM PROCEDURE TRAY) ×1 IMPLANT
PAD ARMBOARD POSITIONER FOAM (MISCELLANEOUS) ×2 IMPLANT
PAD ELECT DEFIB RADIOL ZOLL (MISCELLANEOUS) ×1 IMPLANT
PUMP SET IMPELLA 5.5 US (CATHETERS) ×1 IMPLANT
SEALANT HEMOST PREVELEAK 4ML (HEMOSTASIS) ×1 IMPLANT
SEALANT SURG COSEAL 8ML (VASCULAR PRODUCTS) IMPLANT
SOLN 0.9% NACL POUR BTL 1000ML (IV SOLUTION) ×4 IMPLANT
SOLN STERILE WATER BTL 1000 ML (IV SOLUTION) ×2 IMPLANT
SUT ETHILON 3 0 PS 1 (SUTURE) ×2 IMPLANT
SUT PROLENE 3 0 SH1 36 (SUTURE) IMPLANT
SUT PROLENE 5 0 C1 (SUTURE) ×2 IMPLANT
SUT SILK 1 MH (SUTURE) ×4 IMPLANT
SUT SILK 1 TIES 10X30 (SUTURE) ×1 IMPLANT
SUT VIC AB 2-0 CT1 18 (SUTURE) ×2 IMPLANT
SUT VIC AB 3-0 X1 27 (SUTURE) ×1 IMPLANT
TAPE CLOTH SURG 4X10 WHT LF (GAUZE/BANDAGES/DRESSINGS) IMPLANT
TOWEL GREEN STERILE (TOWEL DISPOSABLE) ×1 IMPLANT
TOWEL GREEN STERILE FF (TOWEL DISPOSABLE) ×1 IMPLANT
TRAY FOLEY SLVR 16FR TEMP STAT (SET/KITS/TRAYS/PACK) ×1 IMPLANT
VASCULAR TIE MINI RED 18IN STL (MISCELLANEOUS) ×1 IMPLANT

## 2024-06-03 NOTE — Progress Notes (Signed)
 [address]       [place] [address]             [phone]                 4 Days Post-Op Procedure(s) (LRB): DECANNULATION FOR  ECMO (EXTRACORPOREAL MEMBRANE OXYGENATION) (N/A)   Events: No events _______________________________________________________________ Vitals: BP 132/89   Pulse (!) 115   Temp 97.6 F (36.4 C) (Axillary)   Resp (!) 25   Ht 6' 1 (1.854 m)   Wt 88.8 kg   SpO2 96%   BMI 25.83 kg/m  Filed Weights   06/01/24 0604 06/02/24 0500 06/03/24 0425  Weight: 93 kg 88.4 kg 88.8 kg     - Neuro: sedated  - Cardiovascular: sinus  Drips:milr 0.125 Impella P4 2.2L .   CVP:  [0 mmHg-18 mmHg] 0 mmHg  - Pulm:     ABG    Component Value Date/Time   PHART 7.438 06/01/2024 1503   PCO2ART 30.8 (L) 06/01/2024 1503   PO2ART 78 (L) 06/01/2024 1503   HCO3 20.7 06/01/2024 1503   TCO2 22 06/01/2024 1503   ACIDBASEDEF 3.0 (H) 06/01/2024 1503   O2SAT 58.4 06/03/2024 0540    - Abd: ND - Extremity: warm  .Intake/Output      11/10 0701 11/11 0700 11/11 0701 11/12 0700   P.O. 720    I.V. (mL/kg) 306.4 (3.5) 51.4 (0.6)   Other 238.8 50   NG/GT     IV Piggyback 240.7 461.1   Total Intake(mL/kg) 1505.9 (17) 562.4 (6.3)   Urine (mL/kg/hr) 1180 (0.6) 200 (0.4)   Emesis/NG output     Stool 0 0   Total Output 1180 200   Net +325.9 +362.4        Urine Occurrence 1 x 1 x   Stool Occurrence 3 x 1 x      _______________________________________________________________ Labs:    Latest Ref Rng & Units 06/03/2024    4:15 AM 06/02/2024    3:56 PM 06/02/2024    3:33 AM  CBC  WBC 4.0 - 10.5 K/uL 7.0  6.9  8.1   Hemoglobin 13.0 - 17.0 g/dL 9.2  8.8  9.4   Hematocrit 39.0 - 52.0 % 27.8  27.1  28.1   Platelets 150 - 400 K/uL 145  119  102       Latest Ref Rng & Units 06/03/2024    4:15 AM 06/02/2024    3:56 PM 06/02/2024    3:33 AM  CMP  Glucose 70 - 99 mg/dL 885  829  899   BUN 6 - 20 mg/dL 13  11  12    Creatinine 0.61 -  1.24 mg/dL 8.29  8.60  8.39   Sodium 135 - 145 mmol/L 141  140  140   Potassium 3.5 - 5.1 mmol/L 3.3  3.6  3.7   Chloride 98 - 111 mmol/L 104  104  106   CO2 22 - 32 mmol/L 22  24  23    Calcium  8.9 - 10.3 mg/dL 8.8  8.9  9.1   Total Protein 6.5 - 8.1 g/dL 7.4   7.3   Total Bilirubin 0.0 - 1.2 mg/dL 2.0   3.1   Alkaline Phos 38 - 126 U/L 72   47   AST 15 - 41 U/L 86   39   ALT 0 - 44 U/L 62   21     CXR: -  _______________________________________________________________  Assessment and Plan: POD 6 s/p CABG, central [facility] ECMO, POD5 s/p 5.5 impella placement  Neuro: pain controlled CV: will plan to remove impella today Pulm: pulm hygiene Renal: creat stable GI: on diet Heme: stable ID: [email or web address] Endo: SSI Dispo: continue ICU care   Linnie MALVA Rayas 06/03/2024 12:49 PM

## 2024-06-03 NOTE — Transfer of Care (Signed)
 Immediate Anesthesia Transfer of Care Note  Patient: Thomas [name]  Procedure(s) Performed: REMOVAL, CARDIAC ASSIST DEVICE, IMPELLA ECHOCARDIOGRAM, TRANSESOPHAGEAL, INTRAOPERATIVE  Patient Location: ICU  Anesthesia Type:General  Level of Consciousness: awake, alert , and oriented  Airway & Oxygen Therapy: Patient Spontanous Breathing and Patient connected to face mask oxygen  Post-op Assessment: Report given to RN and Post -op Vital signs reviewed and stable  Post vital signs: Reviewed and stable  Last Vitals:  Vitals Value Taken Time  BP    Temp    Pulse    Resp    SpO2      Last Pain:  Vitals:   06/03/24 1100  TempSrc: Axillary  PainSc:       Patients Stated Pain Goal: 0 (06/03/24 1038)  Complications: No notable events documented.

## 2024-06-03 NOTE — Progress Notes (Addendum)
 NAME:  Thomas [name], MRN:  [id number], DOB:  28-May-1981, LOS: 7 ADMISSION DATE:  05/27/2024, CONSULTATION DATE:  05/28/2024 REFERRING MD:  Dr. Shyrl, CHIEF COMPLAINT:  s/p CABG, post-op vent management  History of Present Illness:  Thomas [name] is a 43 year old male with history of CAD c/b cardiac arrest s/p PCI to OM1 in 2017, s/p right nephrectomy, CKD stage IIIa, HIV (on HAART), Hogdkin's lymphoma (s/p chemo now in remission) who was admitted 05/27/2024 with chest pain and found to have lateral STEMI. He underwent LHC which showed 20% distal LM, 99% ostial LAD, 30% mid LCx, and 30% proximal RCA disease with likely chronically occluded mid LAD and OM1 stent. He underwent successful PTCA to ostial LAD and IABP placement for stabilization. TCTS was consulted for consideration of CABG.  On 11/5 he underwent CABGx2 (LIMA-diag, SVG-OM) with Dr. Shyrl. Intra-op course notable for vasoplegia and cardioplegia with inability to come off bypass due to hypotension. Patient transitioned to central [facility] ECMO via RA and aortic cannulation and was transferred to [facility] ICU for further management.    Intra-op he received 710 mL cell saver, 2 PRBC and 1.5L LR. UOP 575 mL. Aortic cross clamp time 53 minutes, CPB time 119 minutes. TEE showed normal RV, LV dysfunction. Pre-op EF 30-35% on epi and IABP.   Pertinent  Medical History  Per above  Significant Hospital Events: Including procedures, antibiotic start and stop dates in addition to other pertinent events   11/4 admitted for lateral STEMI underwent successful PTCA to ostial LAD  11/5 CABG x 2 (LIMA>Lcx, SVG> OM) complicated by cardiogenic shock requiring central ECMO cannulation to come off bypass.  11/6 impella 5.5 placed, bronch for mucus plugging entire R lung 11/7 decannulated from ECMO, increased inotropes. Mucus plug entire R lung> bronch 11/8 failed SBT 11/9 extubated; still high NGT output 11/10 advanced to clear liquid diet, tolerating well,  NGT removed   Interim History / Subjective:  NGT clamped. Passed SLP evaluation. Tolerated clear liquid diet, advanced to fulls and NGT removed. Slept well overnight. Ambulated this morning without dizziness. Denies CP or SOB.   Objective    Blood pressure 90/75, pulse (!) 112, temperature 98 F (36.7 C), temperature source Axillary, resp. rate (!) 22, height 6' 1 (1.854 m), weight 88.8 kg, SpO2 97%. PAP: (26-35)/(16-21) 31/19 CVP:  [0 mmHg-29 mmHg] 3 mmHg CO:  [5.9 L/min] 5.9 L/min CI:  [2.69 L/min/m2] 2.69 L/min/m2      Intake/Output Summary (Last 24 hours) at 06/03/2024 9147 Last data filed at 06/03/2024 0700 Gross per 24 hour  Intake 1283.05 ml  Output 1070 ml  Net 213.05 ml   Filed Weights   06/01/24 0604 06/02/24 0500 06/03/24 0425  Weight: 93 kg 88.4 kg 88.8 kg    Examination: General: no acute distress, resting in bed comfortably  HENT: [place]/AT, sclera anicteric Lungs: breathing comfortably on room air, clear to auscultation bilaterally Cardiovascular: sinus tachycardia, regular rate and rhythm, normal S1 and S2 Abdomen: soft, non-tender, non-distended, +bowel sounds Extremities: warm, dry, no edema, right axillary impella in place  Neuro: alert and responsive, no focal deficits  GU: deferred    I/O -407.8cc  Resolved problem list  AKI Post-operative mechanical ventilation  Ileus  Assessment and Plan   Acute post-bypass cardiogenic shock requiring central [facility] ECMO > decannulated 11/7 CAD s/p CABG x2 (LIMA-diag, SVG-OM) Acute post-bypass vasoplegia Acute lateral STEMI s/p PCI and IABP  IABP removed 11/5. Impella 5.5 placed 11/6. ECMO decannulation 11/7. Weaned  off vaso and levo 11/9. CVP remains 3-4, Co-ox 58.4 this morning. Echo yesterday with normal EF, RV not well visualized. - Post-op care per TCTS - Impella at P4; continue heparin  at 500 units/hr. Plan for decannulation today - Continue milrinone 0.125 mcg/kg/min for now, plan to wean following  decannulation - Continue ASA and statin. Monitor LFTs - Monitor electrolytes, replete as needed - Diuresis per advanced heart failure   Mucus plugging-- RLL still consolidated, possible pneumonia Currently breathing comfortably on room air. BAL 11/6 no growth x 3 days. BAL 11/7 no growth x 2 days. CXR this morning with improving bibasilar atelectasis and mild pulmonary venous congestion. - Encourage IS/pulmonary toilet  - Plan for 7 day course of Zosyn    CKD stage IIIa S/p right nephrectomy (04/30/2014) for nonresolving nephrolithiasis Baseline Cr ~ 1.7-2.0. Cr remains within baseline.  - strict I/O - renally dose meds, avoid nephrotoxic meds - will discontinue foley   Expected post-operative acute blood loss anemia Expected post-operative consumptive thrombocytopenia, improving  - transfuse for Hb <7 or hemodynamically significant bleeding - watch platelets closely with heparin  and impella - trend - plan for post-op CBC  Acute post-operative pain Anxiety - Continue nightly Seroquel, stop PRN Haldol  - Continue pain control with lidocaine  patch, scheduled APAP, PRN tramadol, oxycodone  and morphine    Deconditioning, improving  - Continue PT, OT - progressive mobility  History of HIV; has had undetectable VL since 2019 - On cebenuva at home; due for next injection ~06/30/2024   Stress hyperglycemia - Continue SSI, has not required any insulin  but has only had full liquid diet and now NPO for OR will discontinue if no requirements when tolerating regular diet - CBG goal 140-180   Labs   CBC: Recent Labs  Lab 05/27/24 1139 05/28/24 0243 06/01/24 0423 06/01/24 1503 06/01/24 1505 06/02/24 0333 06/02/24 1556 06/03/24 0415  WBC 5.6   < > 7.7  --  10.1 8.1 6.9 7.0  NEUTROABS 3.1  --   --   --   --   --   --   --   HGB 14.6   < > 7.6* 10.5* 10.0* 9.4* 8.8* 9.2*  HCT 44.5   < > 22.4* 31.0* 29.6* 28.1* 27.1* 27.8*  MCV 100.7*   < > 93.3  --  93.4 94.6 96.4 95.2  PLT 204   <  > 83*  --  109* 102* 119* 145*   < > = values in this interval not displayed.    Basic Metabolic Panel: Recent Labs  Lab 05/30/24 0449 05/30/24 0456 05/31/24 0403 05/31/24 0628 05/31/24 1730 06/01/24 0414 06/01/24 0423 06/01/24 1503 06/01/24 1505 06/02/24 0333 06/02/24 1556 06/03/24 0415  NA 139   < > 135   < > 133*   < > 134* 140 140 140 140 141  K 4.0   < > 4.0   < > 4.1   < > 3.5 3.8 4.0 3.7 3.6 3.3*  CL 107   < > 107  --  101  --  100  --  102 106 104 104  CO2 20*   < > 20*  --  19*  --  20*  --  21* 23 24 22   GLUCOSE 124*   < > 133*  --  149*  --  122*  --  108* 100* 170* 114*  BUN 8   < > 8  --  9  --  8  --  11 12 11  13  CREATININE 1.40*   < > 1.45*  --  1.49*  --  1.46*  --  1.64* 1.60* 1.39* 1.70*  CALCIUM  7.6*   < > 7.6*  --  7.7*  --  7.8*  --  9.1 9.1 8.9 8.8*  MG 1.9   < > 1.9  --  2.2  --  2.1  --   --  2.8*  --  2.3  PHOS 3.0  --   --   --   --   --  2.7  --   --   --   --   --    < > = values in this interval not displayed.   GFR: Estimated Creatinine Clearance: 64.6 mL/min (A) (by C-G formula based on SCr of 1.7 mg/dL (H)). Recent Labs  Lab 05/29/24 1846 05/30/24 0449 05/30/24 0502 05/30/24 1322 05/30/24 1907 05/31/24 0403 05/31/24 0634 05/31/24 1752 06/01/24 1505 06/02/24 0333 06/02/24 1556 06/03/24 0415  WBC 7.2   < >  --    < >  --    < >  --    < > 10.1 8.1 6.9 7.0  LATICACIDVEN 0.8  --  0.5  --  1.1  --  0.8  --   --   --   --   --    < > = values in this interval not displayed.    Critical care time:     The patient is critically ill with multiple organ system failure and requires high complexity decision making for assessment and support, frequent evaluation and titration of therapies, advanced monitoring, review of radiographic studies and interpretation of complex data.   Critical Care Time devoted to patient care services, exclusive of separately billable procedures, described in this note is 35 minutes.  Rexene LOISE Blush,  PA-C [facility] 06/03/24 8:52 AM  Please see [email or web address] for pager details.  From 7A-7P if no response, please call [phone] After hours, please call ELink [phone]

## 2024-06-03 NOTE — Progress Notes (Signed)
 Physical Therapy Treatment Patient Details Name: Thomas [name] MRN: [id number] DOB: [birth year] Today's Date: 06/03/2024   History of Present Illness 43 y/o M presentign to ED on 11/4 with chest pain, found to have lateral STEMI. LHC with occluded mid LAD and OM1 stent, underwent unsucccessful PTCA to astial LAD and IABP placement for stabilization (removed 11/5). Impella placed 11/6. S/p CABG on 11/5 (LIMA-diag, SVG-OM), intrapoerative course notable for vasoplegia, cardioplegia, and inabiltiy to come off bypass due to hypotension, transitioned to [facility] ECMO and was decannulated 11/7. Removal of right axillary artery Impella 5.5 11/11.    PMH includes CAD c/b cardiac arrest s/p PCI to OM1, R nephrectomy, CKD IIIA, HIV, Hodgkin's lymphoma s/p chemo now in remission    PT Comments  Building increased functional capacity with prolonged ambulatory tolerance today but still requiring intermittent min assist for balance, even with RW. Reviewed use to maximize support with light UE pressure to maintain precautions to remaining in good proximity to device. Drifting left frequently, requires verbal cues and min assist to correct with education for awareness. HR maxed at 151bpm, avg in 130s, two standing rest breaks to complete distance. Educated on pacing and RPE awareness. CGA for bed mobility cues for precautions on a couple of occasions. Min assist to stand with RW to steady. Making great progress. Patient will continue to benefit from skilled physical therapy services to further improve independence with functional mobility.     If plan is discharge home, recommend the following: Assistance with cooking/housework;Assist for transportation;Help with stairs or ramp for entrance;A little help with walking and/or transfers;A little help with bathing/dressing/bathroom   Can travel by private vehicle        Equipment Recommendations  Other (comment);Rolling walker (2 wheels) (TBD)    Recommendations for  Other Services Rehab consult     Precautions / Restrictions Precautions Precautions: Fall;Sternal Precaution Booklet Issued: No (Verbally reviewed) Recall of Precautions/Restrictions: Impaired Precaution/Restrictions Comments: impella R axilla Restrictions Weight Bearing Restrictions Per Provider Order: Yes RUE Weight Bearing Per Provider Order: Non weight bearing LUE Weight Bearing Per Provider Order: Non weight bearing RLE Weight Bearing Per Provider Order: Non weight bearing Other Position/Activity Restrictions: cardiac sternal precautions     Mobility  Bed Mobility Overal bed mobility: Needs Assistance Bed Mobility: Supine to Sit     Supine to sit: Contact guard, HOB elevated     General bed mobility comments: CGA for safety with multiple cues for sternal precautions. Holding heart pillow to recall but attempts to reach back and push intermittently unless stopped.    Transfers Overall transfer level: Needs assistance Equipment used: Rolling walker (2 wheels) Transfers: Sit to/from Stand Sit to Stand: Min assist           General transfer comment: Min assist for light boost to stand from bed, fair control with descent into low recliner. Cues to release RW prior to attempting to sit and bring hands to center mass. Holding heart pillow to rise.    Ambulation/Gait Ambulation/Gait assistance: Min assist Gait Distance (Feet): 625 Feet Assistive device: Rolling walker (2 wheels) Gait Pattern/deviations: Step-through pattern, Decreased stride length, Staggering right, Drifts right/left, Trunk flexed, Narrow base of support Gait velocity: dec Gait velocity interpretation: 1.31 - 2.62 ft/sec, indicative of limited community ambulator   General Gait Details: Intermittent min assist for LOB despite UE support on RW. Has a tendency to frequently drift towards left.  Needs verbal cues for awareness, education on proximity to device and hand placement to even  distribution while  maintaining sternal precautions. Assist frequently with RW control. No buckling noted today. Completed distance with two standing rest breaks, HR to 151 at peak but avg in 130s.   Stairs             Wheelchair Mobility     Tilt Bed    Modified Rankin (Stroke Patients Only)       Balance Overall balance assessment: Needs assistance Sitting-balance support: Feet supported Sitting balance-Leahy Scale: Good Sitting balance - Comments: sits unsupported EOB   Standing balance support: Reliant on assistive device for balance, No upper extremity supported Standing balance-Leahy Scale: Fair Standing balance comment: CGA standing un supported.                            Communication Communication Communication: No apparent difficulties  Cognition Arousal: Alert Behavior During Therapy: Flat affect   PT - Cognitive impairments: Sequencing, Problem solving, Awareness                         Following commands: Intact      Cueing Cueing Techniques: Verbal cues  Exercises      General Comments General comments (skin integrity, edema, etc.): While ambulating HR to 151 at peak, standing rest break to recover, back to 130s on avg. At rest HR 122      Pertinent Vitals/Pain Pain Assessment Pain Assessment: Faces Faces Pain Scale: Hurts a little bit Pain Location: sternum Pain Descriptors / Indicators: Discomfort Pain Intervention(s): Limited activity within patient's tolerance, Monitored during session, Repositioned    Home Living                          Prior Function            PT Goals (current goals can now be found in the care plan section) Acute Rehab PT Goals Patient Stated Goal: Get well return home PT Goal Formulation: With patient Time For Goal Achievement: 06/16/24 Potential to Achieve Goals: Good Progress towards PT goals: Progressing toward goals    Frequency    Min 3X/week      PT Plan       Co-evaluation              AM-PAC PT 6 Clicks Mobility   Outcome Measure  Help needed turning from your back to your side while in a flat bed without using bedrails?: A Little Help needed moving from lying on your back to sitting on the side of a flat bed without using bedrails?: A Little Help needed moving to and from a bed to a chair (including a wheelchair)?: A Little Help needed standing up from a chair using your arms (e.g., wheelchair or bedside chair)?: A Little Help needed to walk in hospital room?: A Little Help needed climbing 3-5 steps with a railing? : A Lot 6 Click Score: 17    End of Session   Activity Tolerance: Patient tolerated treatment well Patient left: in chair;with call bell/phone within reach;with chair alarm set;with nursing/sitter in room Nurse Communication: Mobility status PT Visit Diagnosis: Unsteadiness on feet (R26.81);Other abnormalities of gait and mobility (R26.89);Muscle weakness (generalized) (M62.81);Difficulty in walking, not elsewhere classified (R26.2);Other symptoms and signs involving the nervous system (R29.898);Pain Pain - part of body:  (sternum)     Time: 8288-8274 PT Time Calculation (min) (ACUTE ONLY): 14 min  Charges:    $Gait  Training: 8-22 mins PT General Charges $$ ACUTE PT VISIT: 1 Visit                     Leontine Roads, PT, DPT [facility]  Rehabilitation Services Physical Therapist Office: [phone] Website: [email or web address]    Leontine GORMAN Roads 06/03/2024, 5:43 PM

## 2024-06-03 NOTE — Progress Notes (Signed)
 Inpatient Rehab Coordinator Note:  I met with patient at bedside to discuss CIR recommendations and goals/expectations of CIR stay.  We reviewed 3 hrs/day of therapy, physician follow up, and average length of stay 2 weeks (dependent upon progress) with goals of modified independence/supervision.  We talked about barriers to CIR at this point remain impella (being d/c'd today), and need to tolerate more than a liquid diet.  Pt reports he ambulated 2 laps around the unit (unclear whether small/large laps).  We talked about following for CIR to see if he still needs the intensity of that rehab when ready for discharge.  We also discussed if need for rehab will need insurance auth.  I will follow for progress.    Reche Lowers, PT, DPT Admissions Coordinator [phone] 06/03/24 12:45 PM

## 2024-06-03 NOTE — Op Note (Signed)
 CARDIOVASCULAR SURGERY OPERATIVE NOTE  06/03/24   Surgeon:  Con Clunes, MD  First Assistant: Suzen Stacks, RNFA   Preoperative Diagnosis:  Post-cardiotomy shock   Postoperative Diagnosis:  Same   Procedure:  Removal of right axillary artery Impella 5.5  Anesthesia:  General Endotracheal   Clinical History/Surgical Indication: 68M s/p CABG with inability to separate from bypass.  He was on central VA ECMO and then converted to Impella 5.5.  He has subsequently had recovery of his heart function and now presents for Impella removal.  Preparation:  The patient was taken directed from the ICU to the OR. The consent was signed by me. Preoperative antibiotics were given.  After being placed under general endotracheal anesthesia by the anesthesia team the neck and chest were prepped with betadine soap and solution and draped in the usual sterile manner. A surgical time-out was taken and the correct patient and operative procedure were confirmed with the nursing and anesthesia staff.  Procedure in detail:  The previous axillary incision was re-opened and irrigated.  The Impella was turned down to P2 and the hemodynamics were assessed.  A booted tonsil was used to clamp the graft.  After five minutes, the patient remained stable.   The graft was unclamped and the Impella was retracted from across the aortic valve.  The Impella was turned to P0 and the Impella was removed in its entirety.  The graft was then ligated with cysto clips and the end oversewn with a 3-0 prolene.  The surgical site was irrigated again and hemostasis was obtained.  The axillary incision was then closed in layers.    There was no radial arterial line, but there was a palpable pulse in the right radial artery at the end of the case.  At the end of the operation, all sponge, instruments, and needle counts were correct.   The patient tolerated the procedure without complication and was extubated and transported back  to the CTICU in stable condition.

## 2024-06-03 NOTE — Anesthesia Postprocedure Evaluation (Signed)
 Anesthesia Post Note  Patient: ELIJAH PHOMMACHANH  Procedure(s) Performed: REMOVAL, CARDIAC ASSIST DEVICE, IMPELLA ECHOCARDIOGRAM, TRANSESOPHAGEAL, INTRAOPERATIVE     Patient location during evaluation: PACU Anesthesia Type: General Level of consciousness: awake and alert Pain management: pain level controlled Vital Signs Assessment: post-procedure vital signs reviewed and stable Respiratory status: spontaneous breathing, nonlabored ventilation, respiratory function stable and patient connected to nasal cannula oxygen Cardiovascular status: blood pressure returned to baseline and stable Postop Assessment: no apparent nausea or vomiting Anesthetic complications: no   No notable events documented.  Last Vitals:  Vitals:   06/03/24 1200 06/03/24 1519  BP: 132/89   Pulse: (!) 115   Resp: (!) 25   Temp:  (!) 36.3 C  SpO2: 96%     Last Pain:  Vitals:   06/03/24 1519  TempSrc: Axillary  PainSc:                  Thom JONELLE Peoples

## 2024-06-03 NOTE — Anesthesia Preprocedure Evaluation (Signed)
 Anesthesia Evaluation  Patient identified by MRN, date of birth, ID band Patient unresponsive    Reviewed: Allergy & Precautions, NPO status , Patient's Chart, lab work & pertinent test results  History of Anesthesia Complications Negative for: history of anesthetic complications  Airway Mallampati: II       Dental no notable dental hx.    Pulmonary former smoker   Pulmonary exam normal        Cardiovascular + Past MI and +CHF   Rhythm:Regular Rate:Normal  Impella 5.5 via right axillary approach. Successful turn down study. Now for impella removal   Milrinone 0.125   Neuro/Psych neg Seizures negative neurological ROS  negative psych ROS   GI/Hepatic negative GI ROS, Neg liver ROS,,,  Endo/Other  negative endocrine ROS    Renal/GU Renal disease     Musculoskeletal negative musculoskeletal ROS (+)    Abdominal   Peds  Hematology  (+) Blood dyscrasia, anemia , HIV  Anesthesia Other Findings   Reproductive/Obstetrics                              Anesthesia Physical Anesthesia Plan  ASA: 4  Anesthesia Plan: General   Post-op Pain Management:    Induction: Intravenous  PONV Risk Score and Plan: 2 and Ondansetron  and Treatment may vary due to age or medical condition  Airway Management Planned: Oral ETT  Additional Equipment:   Intra-op Plan:   Post-operative Plan: Extubation in OR  Informed Consent: I have reviewed the patients History and Physical, chart, labs and discussed the procedure including the risks, benefits and alternatives for the proposed anesthesia with the patient or authorized representative who has indicated his/her understanding and acceptance.     Dental advisory given  Plan Discussed with: CRNA  Anesthesia Plan Comments:          Anesthesia Quick Evaluation

## 2024-06-03 NOTE — TOC Progression Note (Addendum)
 Transition of Care [facility]) - Progression Note    Patient Details  Name: Thomas [name] MRN: [id number] Date of Birth: [birth year]  [facility] CM/SW Contact  Justina Delcia Czar, RN Phone Number: [phone] 06/03/2024, 12:17 PM  Clinical Narrative:     Spoke to pt and gave permission to continue communication with his mother.   Spoke to mother and she had questions about SS disability. States he currently receives SSI and wanted to switch him to full SS disability. Explained she will need to contact SS admin office to complete process.   PT/OT recommended IP rehab. CIR following for IP rehab.   Expected Discharge Plan: Home/Self Care Barriers to Discharge: Continued Medical Work up       Expected Discharge Plan and Services   Discharge Planning Services: CM Consult   Living arrangements for the past 2 months: Apartment                   Social Drivers of Health (SDOH) Interventions SDOH Screenings   Food Insecurity: Patient Declined (05/27/2024)  Housing: Low Risk  (05/27/2024)  Transportation Needs: No Transportation Needs (05/27/2024)  Utilities: Not At Risk (05/27/2024)  Depression (PHQ2-9): Low Risk  (04/30/2024)  Social Connections: Unknown (12/06/2021)   Received from [facility]  Tobacco Use: Medium Risk (05/27/2024)    Readmission Risk Interventions     No data to display

## 2024-06-03 NOTE — Progress Notes (Signed)
 Advanced Heart Failure Rounding Note  Cardiologist: None  Chief Complaint: STEMI Subjective:   11/5 [facility] for failure to wean after cardiac bypass 11/6 Impella 5.5 placement 11/7 ECMO decannulation 11/9 Extubated  CVP 4/5, diuretics held yesterday. Co-ox 58%.   Feels better this morning. Denies CP/SOB.   Objective:   Weight Range: 88.8 kg Body mass index is 25.83 kg/m.   Vital Signs:   Temp:  [91.8 F (33.2 C)-98.7 F (37.1 C)] 98.7 F (37.1 C) (11/10 1500) Pulse Rate:  [109-141] 110 (11/11 0300) Resp:  [16-33] 21 (11/11 0300) BP: (91-145)/(67-130) 92/67 (11/11 0300) SpO2:  [89 %-99 %] 92 % (11/11 0300) Arterial Line BP: (93-224)/(67-210) 224/210 (11/10 0945) Weight:  [88.8 kg] 88.8 kg (11/11 0425) Last BM Date : 06/02/24  Weight change: Filed Weights   06/01/24 0604 06/02/24 0500 06/03/24 0425  Weight: 93 kg 88.4 kg 88.8 kg    Intake/Output:   Intake/Output Summary (Last 24 hours) at 06/03/2024 0706 Last data filed at 06/03/2024 0600 Gross per 24 hour  Intake 1496.33 ml  Output 1180 ml  Net 316.33 ml      Physical Exam  General:  anxious appearing.  No respiratory difficulty Neck: JVD <5.  Cor: Regular rate & rhythm. No murmurs. Lungs: clear Extremities: no edema. R axillary impella site stable. PICC L brachial Neuro: alert & oriented x 3. Affect pleasant.   Tele review: ST low 100s (Personally reviewed)    Medications:     Scheduled Medications:  aspirin  EC  325 mg Oral Daily   Or   aspirin   324 mg Per Tube Daily   atorvastatin   40 mg Oral Daily   bisacodyl   10 mg Oral Daily   Or   bisacodyl   10 mg Rectal Daily   docusate  100 mg Oral BID   epinephrine   0-10 mcg/min Intravenous To OR   feeding supplement  237 mL Oral BID BM   heparin  sodium (porcine) 2,500 Units, papaverine 30 mg in electrolyte-A (PLASMALYTE-A PH 7.4) 500 mL irrigation   Irrigation To OR   insulin  aspart  0-24 Units Subcutaneous Q4H   insulin    Intravenous  To OR   [facility] Blood Cardioplegia vial (lidocaine /magnesium /mannitol 0.26g-4g-6.4g)   Intracoronary To OR   lidocaine   1 patch Transdermal Q24H   pantoprazole  (PROTONIX ) IV  40 mg Intravenous QHS   phenylephrine   30-200 mcg/min Intravenous To OR   polyethylene glycol  17 g Oral Daily   potassium chloride   80 mEq Other To OR   QUEtiapine  25 mg Oral QHS   sodium chloride  flush  10-40 mL Intracatheter Q12H   sodium chloride  flush  10-40 mL Intracatheter Q12H   sodium chloride  flush  3 mL Intravenous Q12H   tranexamic acid  15 mg/kg Intravenous To OR   tranexamic acid  2 mg/kg Intracatheter To OR    Infusions:   ceFAZolin  (ANCEF ) IV      ceFAZolin  (ANCEF ) IV     dexmedetomidine     heparin  30,000 units/NS 1000 mL solution for CELLSAVER     heparin  500 Units/hr (06/03/24 0600)   milrinone 0.125 mcg/kg/min (06/03/24 0600)   milrinone     nitroGLYCERIN      norepinephrine      piperacillin -tazobactam (ZOSYN )  IV 12.5 mL/hr at 06/03/24 0600   potassium chloride  10 mEq (06/03/24 0658)   sodium bicarbonate  25 mEq (Impella PURGE) in dextrose  5 % 1000 mL bag     tranexamic acid (CYKLOKAPRON) 2,500 mg  in sodium chloride  0.9 % 250 mL (10 mg/mL) infusion     vancomycin       PRN Medications: haloperidol lactate, morphine  injection, ondansetron  (ZOFRAN ) IV, mouth rinse, oxyCODONE , sodium chloride  flush, sodium chloride  flush, sodium chloride  flush, traMADol    Patient Profile   TRESTAN VAHLE is a 43 y.o. male with a PMH of CAD s/p PCI to the OM1 in 2017, chronic angina, right nephrectomy, CKD stage IIIa, HIV, hodgkin's lymphoma treated with chemotherapy who presents with STEMI. Course complicated by post cardiotomy shock and subsequent central cannulation for ECMO.  Assessment/Plan   Post cardiotomy shock:  - Cannulated 11/5 for central ECMO - EF in the lab (unloaded) around 25-30% with anterior and inferior akinesis - IABP removed 11/5 - Impella 5.5 placed 11/6 - Decannulated  11/7 - Extubated 11/9, doing well.  - Impella at P4, 2L/min. Plan to wean off today . - Co-ox 58%. Continue milrinone 0.125, plan to wean post impella removal  - Continue flat dose heparin , LDH slightly up today, no evidence of hemolysis - Volume stable, CVP <5. Will hold off on further diuretics today. Responds briskly to IV lasix. Will give 500 LR bolus this morning with rise in SCr and now NPO.  - Off vanc with closed chest, continue zosyn   CAD/STEMI: Initial EKG with lateral STEMI, changes and chest pain improved in the ED but given dynamic changes taking to the cath lab. - S/p POBA to the ostial LAD - Poor stent landing zone, multivessel CAD, reduced EF - Poor CABG targets, s/p LIMA-D1, SVG-OM - plavix off with MCS, start prior to discharge. Aspirin  325mg  daily for now - Statin on hold with shock, LFTs downtrending  Acute systolic heart failure: - Due to ischemia - Management of shock as above  HIV:  - Continue home meds  AKI on CKD: Prior right nephrectomy due to complications from kidney stone, chronic hydronephrosis. Creatinine improving with support - due to shock, ATN - SCr back up to 1.7. Suspect dry. Bolus as above.    CRITICAL CARE Performed by: Beckey LITTIE Coe   Total critical care time: 17 minutes  Critical care time was exclusive of separately billable procedures and treating other patients.  Critical care was necessary to treat or prevent imminent or life-threatening deterioration.  Critical care was time spent personally by me on the following activities: development of treatment plan with patient and/or surrogate as well as nursing, discussions with consultants, evaluation of patient's response to treatment, examination of patient, obtaining history from patient or surrogate, ordering and performing treatments and interventions, ordering and review of laboratory studies, ordering and review of radiographic studies, pulse oximetry and re-evaluation of patient's  condition.

## 2024-06-03 NOTE — Progress Notes (Signed)
 PHARMACY - ANTICOAGULATION CONSULT NOTE  Pharmacy Consult for heparin  Indication: Impella 5.5   Allergies  Allergen Reactions   Tylenol  [Acetaminophen ] Other (See Comments)    Cold sweats    Patient Measurements: Height: 6' 1 (185.4 cm) Weight: 88.8 kg (195 lb 12.3 oz) IBW/kg (Calculated) : 79.9 HEPARIN  DW (KG): 94.8  Vital Signs: BP: 90/75 (11/11 0700) Pulse Rate: 112 (11/11 0700)  Labs: Recent Labs    06/01/24 0423 06/01/24 1503 06/02/24 0333 06/02/24 1556 06/03/24 0415  HGB 7.6*   < > 9.4* 8.8* 9.2*  HCT 22.4*   < > 28.1* 27.1* 27.8*  PLT 83*   < > 102* 119* 145*  LABPROT 16.0*  --  15.7*  --  15.4*  INR 1.2  --  1.2  --  1.2  HEPARINUNFRC <0.10*  --  <0.10*  --  <0.10*  CREATININE 1.46*   < > 1.60* 1.39* 1.70*   < > = values in this interval not displayed.    Estimated Creatinine Clearance: 64.6 mL/min (A) (by C-G formula based on SCr of 1.7 mg/dL (H)).    Assessment: 43yo male presented with chest pain and found to have STEMI. IABP placed 11/4>CABG 11/5 unstable coming off pump > central ECMO cannulation > impella 5.5 placed 11/6> decannulated 11/7.   On fixed rate heparin  infusion at 500 units/hr with Impella 5.5 (running stable at P4). Heparin  level came back undetectable as expected. Hgb 9.2, plt 145. Fibrinogen  >800, LDH 338. No s/sx of bleeding or infusion issues. Remains on bicarb purge solution.   Goal of Therapy:  Heparin  level 0.2-0.5 units/ml Monitor platelets by anticoagulation protocol: Yes  Plan:  Continue heparin  infusion at 500 units/hr until OR for planned impella removal 11/11 Monitor s/sx of bleeding   Thank you for allowing pharmacy to participate in this patient's care,  Suzen Sour, PharmD, BCCCP Clinical Pharmacist  Phone: [phone] 06/03/2024 7:19 AM  Please check AMION for all [facility] phone numbers After 10:00 PM, call Main Pharmacy [phone]

## 2024-06-03 NOTE — Progress Notes (Signed)
 Daily Progress Note   Date: 06/03/2024   Patient Name: Thomas [name]  DOB: Feb 28, 1981  MRN: [id number]  Age / Sex: 43 y.o., male  Attending Physician: Shyrl Linnie KIDD, MD Primary Care Physician: Pcp, No Admit Date: 05/27/2024 Length of Stay: 7 days  Reason for Follow-up: Psychosocial/spiritual support  Past Medical History:  Diagnosis Date   Acute respiratory failure with hypoxemia: post cardiac arrest 05/19/2014   Cancer (HCC)    hodgkins, no current treatment for   Cardiac arrest (HCC) 05/15/2014   Chronic renal insufficiency    CKD (chronic kidney disease) s/p Right nephrectomy (04/30/2014) 05/19/2014   CLABSI (central line-associated bloodstream infection) 02/27/2014   Enteritis 01/22/2014   History of blood transfusion july 2015 per pt   HIV disease (HCC) 06/25/2012   Hydronephrosis, right & hydroureter: secondary to obstructing ureteral calculi 03/01/2014   Leg pain 07/03/2012   Nephrolithiasis    NSTEMI (non-ST elevated myocardial infarction) (HCC) 05/19/2014   Obstructive uropathy 12/12/2014   Status post left renal stent   Penile cyst 08/06/2012   Septic shock (HCC) 01/18/2014   Shingles rash 02/25/2014   Sinus tachycardia 08/06/2012   Skin lesion 07/03/2012   Staphylococcus aureus bacteremia 02/25/2014   Transaminitis 05/19/2014    Subjective:   Subjective: Chart Reviewed. Updates received. Patient Assessed. Created space and opportunity for patient  and family to explore thoughts and feelings regarding current medical situation.  Today's Discussion: Today before meeting with the patient/family, I reviewed the chart notes including nursing notes from yesterday, CVTS notes from yesterday, PCCM notes from yesterday, cardiology notes from yesterday, cardiology note from today, PCCM note from today. I also reviewed vital signs, nursing flowsheets, medication administrations record, labs, and imaging. Labs reviewed include HFP which shows improvement in  bilirubin from 3.1 yesterday to 2.0 today bump in transaminases with AST/ALT 86/62 compared to 39/21 yesterday in the setting of reduced EF, recent ECMO, now on Impella device.  BMP shows drop in potassium to 3.3 with orders to replete today.  Creatinine with mild bump today to 1.7 compared to 1.39 yesterday however was also 1.6 yesterday and 1.64 two days ago, in the setting of sepsis cardiac arrest, reduced EF/heart failure recently on ECMO and recently decannulated, now on Impella device.  Today saw the patient at bedside, he is lying in bed and is awake.  Impella device remains, no longer on ECMO or ventilator.  He was extubated and decannulated from ECMO over the weekend.  Per notes plan to remove Impella device today.  We spent time talking about his significant improvement over the weekend, multiple visitors that have come to support him.  Overall he states he is feeling well.  He apparently was able to work with PT and walk in the hall yesterday.  I shared that I would reach out to his mom and tell her they came to see him and he is in agreement.  After seeing the patient I reach out to his mother.  We spent time celebrating the victories that have occurred over the weekend at her home for continued improvement.  We talked about the likelihood of the need for rehab at discharge due to weakness, she is in full agreement with this.  She talked about her conversations with her son on the need to slow down.  I offered that palliative medicine can back off given that he is a substantial improvements.  However, she is asked that we remain on board.  I shared that I could  follow-up on Friday and would likely check in on him 1-2 times a week while he remains admitted, which he agrees to.  I provided emotional and general support through therapeutic listening, empathy, sharing of stories, and other techniques. I answered all questions and addressed all concerns to the best of my ability.  Review of Systems   Respiratory:  Negative for shortness of breath.   Gastrointestinal:  Negative for abdominal pain, nausea and vomiting.    Objective:   Primary Diagnoses: Present on Admission:  STEMI involving left anterior descending coronary artery (HCC)  Cardiogenic shock (HCC)   Vital Signs:  BP 90/75   Pulse (!) 112   Temp 98 F (36.7 C) (Axillary)   Resp (!) 22   Ht 6' 1 (1.854 m)   Wt 88.8 kg   SpO2 97%   BMI 25.83 kg/m   Physical Exam Vitals and nursing note reviewed.  Constitutional:      General: He is not in acute distress.    Appearance: He is ill-appearing.  HENT:     Head: Normocephalic and atraumatic.  Cardiovascular:     Rate and Rhythm: Normal rate.  Pulmonary:     Effort: Pulmonary effort is normal. No respiratory distress.  Abdominal:     General: Abdomen is flat. Bowel sounds are normal. There is no distension.     Palpations: Abdomen is soft.  Skin:    General: Skin is warm and dry.  Neurological:     General: No focal deficit present.     Mental Status: He is alert.  Psychiatric:        Mood and Affect: Mood normal.        Behavior: Behavior normal.     Palliative Assessment/Data: 50-60%   Existing Vynca/ACP Documentation: None  Assessment & Plan:   HPI/Patient Profile:  43 y.o. male  with past medical history of CAD c/b cardiac arrest s/p PCI to OM1 in 2017, s/p right nephrectomy, CKD stage IIIa, HIV (on HAART), Hogdkin's lymphoma (s/p chemo now in remission) who was admitted 05/27/2024 with chest pain and found to have lateral STEMI.  His hospital stay has been complicated by successful PTCA to ostial LAD, CABG x 2 complicated by cardiogenic shock requiring central ECMO cannulation to come off bypass.    Palliative medicine has been consulted for ongoing support of patient's family and ECMO status.  SUMMARY OF RECOMMENDATIONS   Full code Full scope of care Will remain involved for ongoing support of patient's family per mother's  request Palliative medicine will follow-up Friday Please notify us  of any significant palliative needs in the interim  Symptom Management:  Per primary team Palliative medicine is available to assist as needed  Code Status: Full Code  Prognosis: Unable to determine  Discharge Planning: To Be Determined  Discussed with: Patient, family, medical team, nursing team  Thank you for allowing us  to participate in the care of Thomas [name] PMT will continue to support holistically.  Billing based on MDM: Moderate  Detailed review of medical records (labs, imaging, vital signs), medically appropriate exam, discussed with treatment team, counseling and education to patient, family, & staff, documenting clinical information, medication management, coordination of care  Camellia Kays, NP Palliative Medicine Team  Team Phone # [phone] (Nights/Weekends)  03/22/2021, 8:17 AM

## 2024-06-03 NOTE — Anesthesia Procedure Notes (Signed)
 Procedure Name: Intubation Date/Time: 06/03/2024 2:09 PM  Performed by: Claudene Arlin LABOR, CRNAPre-anesthesia Checklist: Patient identified, Emergency Drugs available, Suction available and Patient being monitored Patient Re-evaluated:Patient Re-evaluated prior to induction Oxygen Delivery Method: Circle system utilized Preoxygenation: Pre-oxygenation with 100% oxygen Induction Type: IV induction Ventilation: Mask ventilation without difficulty Laryngoscope Size: Miller and 2 Grade View: Grade I Tube type: Oral Tube size: 7.5 mm Number of attempts: 1 Airway Equipment and Method: Stylet Placement Confirmation: ETT inserted through vocal cords under direct vision, positive ETCO2 and breath sounds checked- equal and bilateral Secured at: 24 cm Tube secured with: Tape Dental Injury: Teeth and Oropharynx as per pre-operative assessment

## 2024-06-03 NOTE — Progress Notes (Signed)
 Brief Progress Note  Doing well today.  Continues to remain hemodynamically stable.  Vitals:   06/03/24 1100 06/03/24 1200  BP: 102/78 132/89  Pulse: (!) 113 (!) 115  Resp: (!) 22 (!) 25  Temp: 97.6 F (36.4 C)   SpO2: 93% 96%    Plan:  OR today for Impella removal  Con Clunes, MD Cardiothoracic Surgery Pager: [phone]

## 2024-06-04 ENCOUNTER — Encounter (HOSPITAL_COMMUNITY): Payer: Self-pay

## 2024-06-04 ENCOUNTER — Inpatient Hospital Stay (HOSPITAL_COMMUNITY)

## 2024-06-04 ENCOUNTER — Other Ambulatory Visit (HOSPITAL_COMMUNITY): Payer: Self-pay

## 2024-06-04 ENCOUNTER — Telehealth (HOSPITAL_COMMUNITY): Payer: Self-pay

## 2024-06-04 DIAGNOSIS — J690 Pneumonitis due to inhalation of food and vomit: Secondary | ICD-10-CM

## 2024-06-04 DIAGNOSIS — I213 ST elevation (STEMI) myocardial infarction of unspecified site: Secondary | ICD-10-CM | POA: Diagnosis not present

## 2024-06-04 DIAGNOSIS — R570 Cardiogenic shock: Secondary | ICD-10-CM | POA: Diagnosis not present

## 2024-06-04 DIAGNOSIS — Z9889 Other specified postprocedural states: Secondary | ICD-10-CM

## 2024-06-04 DIAGNOSIS — I509 Heart failure, unspecified: Secondary | ICD-10-CM | POA: Diagnosis not present

## 2024-06-04 DIAGNOSIS — I2102 ST elevation (STEMI) myocardial infarction involving left anterior descending coronary artery: Secondary | ICD-10-CM | POA: Diagnosis not present

## 2024-06-04 DIAGNOSIS — Z515 Encounter for palliative care: Secondary | ICD-10-CM

## 2024-06-04 DIAGNOSIS — Z951 Presence of aortocoronary bypass graft: Secondary | ICD-10-CM | POA: Diagnosis not present

## 2024-06-04 DIAGNOSIS — J189 Pneumonia, unspecified organism: Secondary | ICD-10-CM | POA: Diagnosis not present

## 2024-06-04 LAB — MAGNESIUM: Magnesium: 2.4 mg/dL (ref 1.7–2.4)

## 2024-06-04 LAB — COOXEMETRY PANEL
Carboxyhemoglobin: 2.3 % — ABNORMAL HIGH (ref 0.5–1.5)
Methemoglobin: <0.7 % (ref 0.0–1.5)
O2 Saturation: 58.7 %
Total hemoglobin: 9.4 g/dL — ABNORMAL LOW (ref 12.0–16.0)

## 2024-06-04 LAB — GLUCOSE, CAPILLARY
Glucose-Capillary: 98 mg/dL (ref 70–99)
Glucose-Capillary: 124 mg/dL — ABNORMAL HIGH (ref 70–99)
Glucose-Capillary: 95 mg/dL (ref 70–99)
Glucose-Capillary: 83 mg/dL (ref 70–99)
Glucose-Capillary: 90 mg/dL (ref 70–99)

## 2024-06-04 LAB — ECHOCARDIOGRAM COMPLETE
Area-P 1/2: 5.54 cm2
Calc EF: 59 %
Height: 73 in
S' Lateral: 2.9 cm
Single Plane A2C EF: 62.5 %
Single Plane A4C EF: 56 %
Weight: 3072.33 [oz_av]

## 2024-06-04 LAB — BASIC METABOLIC PANEL WITH GFR
Anion gap: 15 (ref 5–15)
BUN: 19 mg/dL (ref 6–20)
CO2: 21 mmol/L — ABNORMAL LOW (ref 22–32)
Calcium: 9 mg/dL (ref 8.9–10.3)
Chloride: 106 mmol/L (ref 98–111)
Creatinine, Ser: 1.66 mg/dL — ABNORMAL HIGH (ref 0.61–1.24)
GFR, Estimated: 53 mL/min — ABNORMAL LOW (ref >60)
Glucose, Bld: 106 mg/dL — ABNORMAL HIGH (ref 70–99)
Potassium: 4.2 mmol/L (ref 3.5–5.1)
Sodium: 142 mmol/L (ref 135–145)

## 2024-06-04 LAB — CBC
HCT: 27.8 % — ABNORMAL LOW (ref 39.0–52.0)
Hemoglobin: 9.2 g/dL — ABNORMAL LOW (ref 13.0–17.0)
MCH: 31.9 pg (ref 26.0–34.0)
MCHC: 33.1 g/dL (ref 30.0–36.0)
MCV: 96.5 fL (ref 80.0–100.0)
Platelets: 204 K/uL (ref 150–400)
RBC: 2.88 MIL/uL — ABNORMAL LOW (ref 4.22–5.81)
RDW: 15 % (ref 11.5–15.5)
WBC: 9 K/uL (ref 4.0–10.5)
nRBC: 0 % (ref 0.0–0.2)

## 2024-06-04 LAB — HEPATIC FUNCTION PANEL
ALT: 126 U/L — ABNORMAL HIGH (ref 0–44)
AST: 140 U/L — ABNORMAL HIGH (ref 15–41)
Albumin: 2.9 g/dL — ABNORMAL LOW (ref 3.5–5.0)
Alkaline Phosphatase: 100 U/L (ref 38–126)
Bilirubin, Direct: 0.5 mg/dL — ABNORMAL HIGH (ref 0.0–0.2)
Indirect Bilirubin: 0.8 mg/dL (ref 0.3–0.9)
Total Bilirubin: 1.3 mg/dL — ABNORMAL HIGH (ref 0.0–1.2)
Total Protein: 7 g/dL (ref 6.5–8.1)

## 2024-06-04 LAB — TRIGLYCERIDES: Triglycerides: 199 mg/dL — ABNORMAL HIGH (ref <150)

## 2024-06-04 MED ORDER — ENOXAPARIN SODIUM 40 MG/0.4ML IJ SOSY
40.0000 mg | PREFILLED_SYRINGE | INTRAMUSCULAR | Status: DC
  Administered 2024-06-04 – 2024-06-05 (×2): 40 mg via SUBCUTANEOUS
  Filled 2024-06-04 (×2): qty 0.4

## 2024-06-04 MED ORDER — SODIUM CHLORIDE 0.9% FLUSH
3.0000 mL | Freq: Two times a day (BID) | INTRAVENOUS | Status: DC
  Administered 2024-06-04 – 2024-06-05 (×3): 3 mL via INTRAVENOUS

## 2024-06-04 MED ORDER — ASPIRIN 81 MG PO CHEW
81.0000 mg | CHEWABLE_TABLET | Freq: Every day | ORAL | Status: DC
  Administered 2024-06-04 – 2024-06-05 (×2): 81 mg via ORAL
  Filled 2024-06-04 (×2): qty 1

## 2024-06-04 MED ORDER — DAPAGLIFLOZIN PROPANEDIOL 10 MG PO TABS
10.0000 mg | ORAL_TABLET | Freq: Every day | ORAL | Status: DC
  Administered 2024-06-04 – 2024-06-05 (×2): 10 mg via ORAL
  Filled 2024-06-04 (×2): qty 1

## 2024-06-04 MED ORDER — SODIUM CHLORIDE 0.9% FLUSH
10.0000 mL | INTRAVENOUS | Status: DC | PRN
  Administered 2024-06-04: 10 mL

## 2024-06-04 MED ORDER — CLOPIDOGREL BISULFATE 75 MG PO TABS
75.0000 mg | ORAL_TABLET | Freq: Every day | ORAL | Status: DC
  Administered 2024-06-04 – 2024-06-05 (×2): 75 mg via ORAL
  Filled 2024-06-04 (×2): qty 1

## 2024-06-04 MED ORDER — CHLORHEXIDINE GLUCONATE CLOTH 2 % EX PADS
6.0000 | MEDICATED_PAD | Freq: Every day | CUTANEOUS | Status: DC
  Administered 2024-06-04: 6 via TOPICAL

## 2024-06-04 MED ORDER — SPIRONOLACTONE 12.5 MG HALF TABLET
12.5000 mg | ORAL_TABLET | Freq: Every day | ORAL | Status: DC
  Administered 2024-06-04 – 2024-06-05 (×2): 12.5 mg via ORAL
  Filled 2024-06-04 (×2): qty 1

## 2024-06-04 MED ORDER — OXYCODONE HCL 5 MG PO TABS: 5.0000 mg | ORAL_TABLET | ORAL | Status: DC | PRN

## 2024-06-04 MED ORDER — SODIUM CHLORIDE 0.9% FLUSH
10.0000 mL | Freq: Two times a day (BID) | INTRAVENOUS | Status: DC
  Administered 2024-06-04: 10 mL
  Administered 2024-06-05: 20 mL

## 2024-06-04 MED ORDER — PIPERACILLIN-TAZOBACTAM 3.375 G IVPB
3.3750 g | Freq: Three times a day (TID) | INTRAVENOUS | Status: AC
  Administered 2024-06-04 (×2): 3.375 g via INTRAVENOUS
  Filled 2024-06-04 (×2): qty 50

## 2024-06-04 MED ORDER — FENTANYL CITRATE (PF) 50 MCG/ML IJ SOSY: 25.0000 ug | PREFILLED_SYRINGE | Freq: Once | INTRAMUSCULAR | Status: DC

## 2024-06-04 MED ORDER — SODIUM CHLORIDE 0.9 % IV SOLN: 250.0000 mL | INTRAVENOUS | Status: AC | PRN

## 2024-06-04 MED ORDER — ~~LOC~~ CARDIAC SURGERY, PATIENT & FAMILY EDUCATION: Freq: Once | Status: AC

## 2024-06-04 MED ORDER — INSULIN ASPART 100 UNIT/ML IJ SOLN: 0.0000 [IU] | Freq: Three times a day (TID) | INTRAMUSCULAR | Status: DC

## 2024-06-04 MED ORDER — SODIUM CHLORIDE 0.9% FLUSH: 3.0000 mL | INTRAVENOUS | Status: DC | PRN

## 2024-06-04 MED ORDER — CHLORHEXIDINE GLUCONATE CLOTH 2 % EX PADS
6.0000 | MEDICATED_PAD | Freq: Every day | CUTANEOUS | Status: DC
  Administered 2024-06-04 – 2024-06-05 (×2): 6 via TOPICAL

## 2024-06-04 MED ORDER — MELATONIN 5 MG PO TABS: 5.0000 mg | ORAL_TABLET | Freq: Every evening | ORAL | Status: DC | PRN

## 2024-06-04 MED ORDER — ACETAMINOPHEN 325 MG PO TABS: 650.0000 mg | ORAL_TABLET | ORAL | Status: DC | PRN

## 2024-06-04 MED ORDER — TRAMADOL HCL 50 MG PO TABS: 50.0000 mg | ORAL_TABLET | ORAL | Status: DC | PRN

## 2024-06-04 NOTE — Progress Notes (Signed)
 Patient ID: Thomas [name], male   DOB: [birth year], 43 y.o.   MRN: [id number]     Advanced Heart Failure Rounding Note  Cardiologist: None  Chief Complaint: STEMI Subjective:   11[facility]5 [facility] for failure to wean after cardiac bypass 11/6 Impella 5.5 placement 11/7 ECMO decannulation 11/9 Extubated 11/11 Impella 5.5 removed  CVP 5 today, co-ox 59%.  Patient is now off milrinone. I/Os net negative 1066.   He is on Zosyn  for possible aspiration.   Feels good, wants to walk around today.   Objective:   Weight Range: 87.1 kg Body mass index is 25.33 kg/m.   Vital Signs:   Temp:  [97.3 F (36.3 C)-98.9 F (37.2 C)] 98.6 F (37 C) (11/12 0333) Pulse Rate:  [92-121] 105 (11/12 0800) Resp:  [0-36] 25 (11/12 0800) BP: (83-147)/(70-96) 101/83 (11/12 0800) SpO2:  [91 %-100 %] 99 % (11/12 0800) Weight:  [87.1 kg] 87.1 kg (11/12 0500) Last BM Date : 06/03/24  Weight change: Filed Weights   06/02/24 0500 06/03/24 0425 06/04/24 0500  Weight: 88.4 kg 88.8 kg 87.1 kg    Intake/Output:   Intake/Output Summary (Last 24 hours) at 06/04/2024 0823 Last data filed at 06/04/2024 0600 Gross per 24 hour  Intake 1014.81 ml  Output 2225 ml  Net -1210.19 ml      Physical Exam   General: NAD Neck: No JVD, no thyromegaly or thyroid  nodule.  Lungs: Clear to auscultation bilaterally with normal respiratory effort. CV: Nondisplaced PMI.  Heart regular S1/S2, no S3/S4, no murmur.  No peripheral edema.    Abdomen: Soft, nontender, no hepatosplenomegaly, no distention.  Skin: Intact without lesions or rashes.  Neurologic: Alert and oriented x 3.  Psych: Normal affect. Extremities: No clubbing or cyanosis.  HEENT: Normal.   Tele review: sinus tachy low 100s (Personally reviewed)    Medications:     Scheduled Medications:  sodium chloride    Intravenous Once   aspirin   81 mg Oral Daily   atorvastatin   40 mg Oral Daily   bisacodyl   10 mg Oral Daily   Or   bisacodyl   10 mg  Rectal Daily   clopidogrel  75 mg Oral Daily   dapagliflozin propanediol  10 mg Oral Daily   docusate  100 mg Oral BID   enoxaparin  (LOVENOX ) injection  40 mg Subcutaneous Q24H   feeding supplement  237 mL Oral BID BM   insulin  aspart  0-24 Units Subcutaneous Q4H   lidocaine   1 patch Transdermal Q24H   pantoprazole  (PROTONIX ) IV  40 mg Intravenous QHS   polyethylene glycol  17 g Oral Daily   QUEtiapine  25 mg Oral QHS   sodium chloride  flush  10-40 mL Intracatheter Q12H   sodium chloride  flush  3 mL Intravenous Q12H   spironolactone  12.5 mg Oral Daily    Infusions:  piperacillin -tazobactam (ZOSYN )  IV 3.375 g (06/04/24 0603)    PRN Medications: morphine  injection, ondansetron  (ZOFRAN ) IV, mouth rinse, oxyCODONE , sodium chloride  flush, sodium chloride  flush, traMADol    Patient Profile   Thomas [name] is a 43 y.o. male with a PMH of CAD s/p PCI to the OM1 in 2017, chronic angina, right nephrectomy, CKD stage IIIa, HIV, hodgkin's lymphoma treated with chemotherapy who presents with STEMI. Course complicated by post cardiotomy shock and subsequent central cannulation for ECMO.  Assessment/Plan   Post cardiotomy shock:  - Cannulated 11/5 for central ECMO - EF in the lab (unloaded) around 25-30% with anterior and  inferior akinesis - IABP removed 11/5 - Impella 5.5 placed 11/6 - Decannulated 11/7 - Extubated 11/9, doing well.  - Impella 5.5 removed 11/11.  - Co-ox 59%, now off milrinone.  - Echo with EF up to 60-65% pre-Impella removal.  Will repeat echo today to reassess LV and RV function with Impella out.  - Start DVT prophylaxis Lovenox .  - Volume stable, CVP 5. He does not need Lasix.  - Add Farxiga 10 mg daily. - Add spironolactone 12.5 daily.   CAD/STEMI: Initial EKG with lateral STEMI, changes and chest pain improved in the ED but given dynamic changes taking to the cath lab. - S/p POBA to the ostial LAD - Poor stent landing zone, multivessel CAD, reduced EF -  Poor CABG targets, s/p LIMA-D1, SVG-OM - Decrease ASA to 81 and start Plavix 75 daily.  - Continue atorvastatin .   HIV:  - On cebenuva at home; due for next injection ~06/30/2024   AKI on CKD: Prior right nephrectomy due to complications from kidney stone, chronic hydronephrosis. Creatinine improving with support - due to shock, ATN - SCr 1.7 => 1.66. Follow closely.   Elevated LFTs: Transaminases mildly higher today post-Impella removal.  Follow closely.   ID: Covering with Zosyn  for possible aspiration.    Ambulate, should be ok for progressive transfer.   Ezra Shuck 06/04/2024 8:28 AM

## 2024-06-04 NOTE — Discharge Summary (Signed)
 [address] [place] [address]             [phone]        Physician Discharge Summary  Patient ID: Thomas [name] MRN: [id number] DOB/AGE: 09-09-1980 43 y.o.  Admit date: 05/27/2024 Discharge date: 06/05/2024  Admission Diagnoses:  Patient Active Problem List   Diagnosis Date Noted   On mechanically assisted ventilation (HCC) 05/28/2024   ABLA (acute blood loss anemia) 05/28/2024   Thrombocytopenia 05/28/2024   STEMI involving left anterior descending coronary artery (HCC) 05/27/2024   Need for prophylactic vaccination and inoculation against influenza 05/09/2021   Encounter for long-term (current) use of high-risk medication 08/16/2020   Medication monitoring encounter 03/06/2017   Screening examination for venereal disease 02/24/2016   Acute coronary syndrome (HCC) 10/27/2015   Unstable angina (HCC) 10/27/2015   Obstructive uropathy 12/12/2014   Nephrolithiasis 12/12/2014   Hyperlipidemia 12/10/2014   CKD (chronic kidney disease) s/p Right nephrectomy (04/30/2014) 05/19/2014   Elevated transaminase level 05/19/2014   Renal neoplasm 04/30/2014   Hypokalemia 01/22/2014   Hodgkin's disease (HCC) 08/26/2013   Iron deficiency anemia, unspecified 08/04/2013   Tachycardia 08/06/2012   Penile cyst 08/06/2012   HIV disease (HCC) 06/25/2012   Discharge Diagnoses:  Patient Active Problem List   Diagnosis Date Noted   Aspiration pneumonia of both lower lobes (HCC) 06/04/2024   Palliative care by specialist 06/04/2024   Malnutrition of moderate degree 06/02/2024   Acute respiratory failure with hypoxia (HCC) 05/30/2024   Mucus plug in respiratory tract 05/29/2024   Cardiogenic shock (HCC) 05/28/2024   Patient receiving ECMO 05/28/2024   S/P CABG x 2 05/28/2024   On mechanically assisted ventilation (HCC) 05/28/2024   ABLA (acute blood loss anemia) 05/28/2024   Thrombocytopenia 05/28/2024   STEMI involving left anterior descending  coronary artery (HCC) 05/27/2024   Need for prophylactic vaccination and inoculation against influenza 05/09/2021   Encounter for long-term (current) use of high-risk medication 08/16/2020   Medication monitoring encounter 03/06/2017   Screening examination for venereal disease 02/24/2016   Acute coronary syndrome (HCC) 10/27/2015   Unstable angina (HCC) 10/27/2015   Obstructive uropathy 12/12/2014   Nephrolithiasis 12/12/2014   Hyperlipidemia 12/10/2014   CKD (chronic kidney disease) s/p Right nephrectomy (04/30/2014) 05/19/2014   Elevated transaminase level 05/19/2014   Renal neoplasm 04/30/2014   Hypokalemia 01/22/2014   Hodgkin's disease (HCC) 08/26/2013   Iron deficiency anemia, unspecified 08/04/2013   Tachycardia 08/06/2012   Penile cyst 08/06/2012   HIV disease (HCC) 06/25/2012   Discharged Condition: good  History of Present Illness:  Thomas [name] is a 43 yo AA male with known history of CAD with previous cardiac arrest with PCI to OM in 2017, CKD Stage 3 S/P Right Nephrectomy, H/O Hodgkins Lymphoma, and HIV.  He presented to [facility] on 11/4 with complaints of chest pain that has been occurring off and on over the past several days.  EKG was obtained and showed lateral ST elevation with diffuse ST depression.  Chest pain improved on arrival, however he was ruled in for STEMI.  Hospital Course:  He was admitted and taken for urgent catheterization which showed 2V CAD.  Intra-Aortic Balloon pump was placed. It was felt coronary bypass grafting would be indicated and TCTS consult was obtained.  He was evaluated by Dr. Shyrl who was in agreement coronary bypass grafting would be indicated.  The risks and benefits of the procedure  were explained to the patient and he was agreeable to proceed.  He was taken to the operating room and underwent Coronary bypass grafting x 2 utilizing LIMA to Diagonal and SVG to OM.  He also underwent endoscopic harvest greater saphenous  vein from right leg.  He developed post cardiotomy shock post separation from bypass.  He required initiation of VA ECMO.  He tolerated the procedure and was taken to the SICU in critical condition. An Impella 5.5 was then placed on 11/06. He had a bronchoscopy the evening of 11/06 for mucous plugging. He remained on Epinephrine , Levophed , Milrinone, and Vasopressin  drips. ECMO was weaned and decannulation was done 11/07. Sedation was weaned and vent was attempted to wean on 11/08 but he did not tolerate spontaneous breathing trial. He was diuresed. He was extubated the morning of 11/09. Norepinephrine  was weaned off. He remained on Vasopressin , Epinephrine  and Milrinone drips. He remained in sinus tachycardia, epicardial pacing wires and chest tubes were removed without complication 11/09.  He was treated with broad spectrum antibiotics in setting of open chest.  He was weaned off Impella support on 11/11 and this removed without difficulty.  Milrinone was also able to be weaned post impella removal. PT/OT evaluations have been completed and they recommend CIR placement.  The patient however did not wish to go there and wanted to go home.  Home health PT/OT was arranged. He was seen by the general cardiology team on 06/05/24 and was started on low-dose metoprolol  succinate. The atorvastatin  was discontinued due to elevation in the LFT's presumed to be due to shock liver.  He will need follow up LFT's in the outpatient setting and re-start of a statin when appropriate.  He was approved for discharge by Dr. Suzette.  Discharge instructions were reviewed with the patient and follow up arranged.   Consults: cardiology, pulmonary/intensive care, and rehabilitation medicine  Significant Diagnostic Studies: angiography:   Coronary angiography 05/27/2024: LM: Distal 20% stenosis LAD: Ostial 99% stenosis (Culprit lesion)          Occluded mid LAD (likely chronic) Lcx: Occluded OM1 stent (likely chronic)          Mid Lcx 30% disease         Left-to-left collaterals to OM1 RCA: Dominant vessel           Prox 30% disease          Left-to-tight collaterals to mid LAD   LVEDP 14 mmHg   Successful percutaneous coronary intervention ostial LAD        PTCA with 2.0 x 8, and 2.5 x 12 mm balloons with improvement in the LAD flow  Intra-aortic balloon pump placement for stabilization pending CABG   Treatments: surgery:  05/28/2024 Patient:  Caron GORMAN Louder Pre-Op Dx: STEMI 2V CAD CHF  HIV CRI   Post-op Dx:  same Procedure: CABG X 2.  LIMA D-1, RSVG OM   Endoscopic greater saphenous vein harvest on the right Central ECMO cannulation    Surgeon and Role:      * Lightfoot, Linnie KIDD, MD - Primary    * E. Barrett , PA-C - assisting An experienced assistant was required given the complexity of this surgery and the standard of surgical care. The assistant was needed for exposure, dissection, suctioning, retraction of delicate tissues and sutures, instrument exchange and for overall help during this procedure.    06/03/24    Surgeon:  Con Clunes, MD   First Assistant: Suzen Stacks, RNFA  Preoperative Diagnosis:  Post-cardiotomy shock     Postoperative Diagnosis:  Same     Procedure:   Removal of right axillary artery Impella 5.5   Anesthesia:  General Endotracheal     Clinical History/Surgical Indication: 33M s/p CABG with inability to separate from bypass.  He was on central VA ECMO and then converted to Impella 5.5.  He has subsequently had recovery of his heart function and now presents for Impella removal.    Discharge Exam: Blood pressure 121/84, pulse 93, temperature 98.1 F (36.7 C), temperature source Oral, resp. rate 17, height 6' 1 (1.854 m), weight 87.9 kg, SpO2 97%. General appearance: alert, cooperative, and no distress Heart: regular rate and rhythm Lungs: clear to auscultation bilaterally Abdomen: soft, non-tender; bowel sounds normal; no masses,  no  organomegaly Extremities: edema trace Wound: clean and dry     Discharge Medications:  The patient has been discharged on:   1.Beta Blocker:  Yes [ x  ]                              No   [   ]                              If No, reason:  2.Ace Inhibitor/ARB: Yes [   ]                                     No  [  x  ]                                     If No, reason:  Renal insufficiency  3.Statin:   Yes [   ]                  No  [   ]                  If No, reason: Elevated LFT's.  Will need to have liver function re-assessed in the outpatient setting and statin resumed when appropriate.  4.Ecasa:  Yes  [  x ]                  No   [   ]                  If No, reason:  Patient had ACS upon admission: Yes  Plavix/P2Y12 inhibitor: Yes [ X  ]                                      No  [   ]     Discharge Instructions     AMB Referral to Cardiac Rehabilitation - Phase II   Complete by: As directed    Pending CABG   Diagnosis: STEMI   After initial evaluation and assessments completed: Virtual Based Care may be provided alone or in conjunction with Phase 2 Cardiac Rehab based on patient barriers.: Yes   Intensive Cardiac Rehabilitation (ICR) [facility] location only OR Traditional Cardiac Rehabilitation (TCR) *If criteria for ICR are not met will enroll in TCR ([facility] only): Yes  AMB Referral to Cardiac Rehabilitation - Phase II   Complete by: As directed    Diagnosis: CABG   CABG X ___: 2   After initial evaluation and assessments completed: Virtual Based Care may be provided alone or in conjunction with Phase 2 Cardiac Rehab based on patient barriers.: Yes   Intensive Cardiac Rehabilitation (ICR) [facility] location only OR Traditional Cardiac Rehabilitation (TCR) *If criteria for ICR are not met will enroll in TCR ([facility] only): Yes      Allergies as of 06/05/2024       Reactions   Tylenol  [acetaminophen ] Other (See Comments)   Cold sweats        Medication List     STOP  taking these medications    Aspir-Low 81 MG tablet Generic drug: aspirin  EC Replaced by: aspirin  81 MG chewable tablet       TAKE these medications    aspirin  81 MG chewable tablet Chew 1 tablet (81 mg total) by mouth daily. Start taking on: June 06, 2024 Replaces: Aspir-Low 81 MG tablet   Cabenuva  600 & 900 MG/3ML injection Generic drug: cabotegravir  & rilpivirine  ER Inject 1 kit into the muscle every 2 (two) months.   clopidogrel 75 MG tablet Commonly known as: PLAVIX Take 1 tablet (75 mg total) by mouth daily. Start taking on: June 06, 2024   dapagliflozin propanediol 10 MG Tabs tablet Commonly known as: FARXIGA Take 1 tablet (10 mg total) by mouth daily. Start taking on: June 06, 2024   metoprolol  succinate 25 MG 24 hr tablet Commonly known as: TOPROL -XL Take 0.5 tablets (12.5 mg total) by mouth daily. Start taking on: June 06, 2024   oxyCODONE  5 MG immediate release tablet Commonly known as: Oxy IR/ROXICODONE  Take 1 tablet (5 mg total) by mouth every 6 (six) hours as needed for up to 7 days for severe pain (pain score 7-10).   spironolactone 25 MG tablet Commonly known as: ALDACTONE Take 0.5 tablets (12.5 mg total) by mouth daily. Start taking on: June 06, 2024        Follow-up Information     Patti Mliss LABOR, FNP Follow up.   Specialty: Family Medicine Why: TIME : 10:40 AM   PLEASE ARRIVE AT  10:15 AM DATE : NOVEMBER 17 ,2025  MONDAY  PLEASE BRING ALL CURRENT MEDICATION, ID and INS CARD, CO-PAY Contact information: [address] [place] [address] [phone]         Abigail Raphael SAILOR, PA-C. Go on 06/18/2024.   Specialties: Cardiology, Radiology Why: Your appointment is at 10:25am Contact information: [address] [place] [address] [phone]         Shyrl Linnie KIDD, MD. Go on 06/16/2024.   Specialty: Cardiothoracic Surgery Why: Your virtual visit (phone call) is at 2pm.  Do not  go to the office, Dr. Shyrl will call you. Contact information: [address] [place] [address] [phone]                 Signed:  Laurel JUDITHANN Becket, PA-C  06/05/2024, 12:58 PM

## 2024-06-04 NOTE — Progress Notes (Signed)
 Occupational Therapy Treatment Patient Details Name: Thomas [name] MRN: [id number] DOB: 10/08/1980 Today's Date: 06/04/2024   History of present illness 43 y/o M presentign to ED on 11/4 with chest pain, found to have lateral STEMI. LHC with occluded mid LAD and OM1 stent, underwent unsucccessful PTCA to astial LAD and IABP placement for stabilization (removed 11/5). Impella placed 11/6. S/p CABG on 11/5 (LIMA-diag, SVG-OM), intrapoerative course notable for vasoplegia, cardioplegia, and inabiltiy to come off bypass due to hypotension, transitioned to [facility] ECMO and was decannulated 11/7. Removal of right axillary artery Impella 5.5 11/11.    PMH includes CAD c/b cardiac arrest s/p PCI to OM1, R nephrectomy, CKD IIIA, HIV, Hodgkin's lymphoma s/p chemo now in remission   OT comments  Pt progressing well towards goals. Provided and reviewed sternal precautions handout with pt. Progressed to complete UB/LB dressing with min assist for cueing with use of compensatory techniques. Progressed to complete in room mobility with no AD at supervision level. Updated d/c recs to cardiac rehab. Will continue to follow acutely.      If plan is discharge home, recommend the following:  Assistance with cooking/housework;A little help with bathing/dressing/bathroom;A little help with walking and/or transfers   Equipment Recommendations  Tub/shower seat       Precautions / Restrictions Precautions Precautions: Fall;Sternal Precaution Booklet Issued: Yes (comment) Recall of Precautions/Restrictions: Impaired Precaution/Restrictions Comments: intermittent cues Restrictions Weight Bearing Restrictions Per Provider Order: No Other Position/Activity Restrictions: cardiac sternal precautions       Mobility Bed Mobility Overal bed mobility: Needs Assistance Bed Mobility: Supine to Sit, Sit to Supine     Supine to sit: Supervision Sit to supine: Supervision   General bed mobility comments: S for safety,  use of heart pillow    Transfers Overall transfer level: Needs assistance Equipment used: None Transfers: Sit to/from Stand Sit to Stand: Supervision           General transfer comment: S for safety, good use of heart pillow to adhere to precautions     Balance Overall balance assessment: Needs assistance Sitting-balance support: Feet supported Sitting balance-Leahy Scale: Good     Standing balance support: No upper extremity supported, During functional activity Standing balance-Leahy Scale: Fair       ADL either performed or assessed with clinical judgement   ADL Overall ADL's : Needs assistance/impaired     Grooming: Supervision/safety;Wash/dry hands;Standing           Upper Body Dressing : Minimal assistance;Sitting   Lower Body Dressing: Minimal assistance;Sit to/from stand   Toilet Transfer: Supervision/safety;Ambulation;Regular Toilet   Toileting- Architect and Hygiene: Supervision/safety;Sit to/from stand       Functional mobility during ADLs: Supervision/safety General ADL Comments: S for safety, assist for cueing with compensatory technique    Extremity/Trunk Assessment Upper Extremity Assessment Upper Extremity Assessment: Generalized weakness   Lower Extremity Assessment Lower Extremity Assessment: Defer to PT evaluation        Vision   Vision Assessment?: No apparent visual deficits         Communication Communication Communication: No apparent difficulties   Cognition Arousal: Alert Behavior During Therapy: WFL for tasks assessed/performed Cognition: No apparent impairments     Following commands: Intact        Cueing   Cueing Techniques: Verbal cues        General Comments HR increased to 140 with BM, RN present at end of session, recovered to 110s at rest      Frequency  Min 2X/week        Progress Toward Goals  OT Goals(current goals can now be found in the care plan section)  Progress towards  OT goals: Progressing toward goals  Acute Rehab OT Goals Patient Stated Goal: To go home OT Goal Formulation: With patient Time For Goal Achievement: 06/16/24 Potential to Achieve Goals: Good ADL Goals Pt Will Perform Grooming: with modified independence;sitting Pt Will Perform Upper Body Dressing: with modified independence;sitting Pt Will Perform Lower Body Dressing: with modified independence;sitting/lateral leans;sit to/from stand Pt Will Transfer to Toilet: with modified independence;regular height toilet;ambulating Additional ADL Goal #1: pt will recall sternal precautions with min cues in prep for ADLs  Plan         AM-PAC OT 6 Clicks Daily Activity     Outcome Measure   Help from another person eating meals?: None Help from another person taking care of personal grooming?: A Little Help from another person toileting, which includes using toliet, bedpan, or urinal?: A Little Help from another person bathing (including washing, rinsing, drying)?: A Little Help from another person to put on and taking off regular upper body clothing?: A Little Help from another person to put on and taking off regular lower body clothing?: A Little 6 Click Score: 19    End of Session    OT Visit Diagnosis: Unsteadiness on feet (R26.81);Other abnormalities of gait and mobility (R26.89);Muscle weakness (generalized) (M62.81)   Activity Tolerance Patient tolerated treatment well   Patient Left in bed;with call bell/phone within reach   Nurse Communication Mobility status        Time: 8475-8453 OT Time Calculation (min): 22 min  Charges: OT General Charges $OT Visit: 1 Visit OT Treatments $Self Care/Home Management : 8-22 mins  Adrianne BROCKS, OT  Acute Rehabilitation Services Office [phone] Secure chat preferred   Adrianne GORMAN Savers 06/04/2024, 4:06 PM

## 2024-06-04 NOTE — Telephone Encounter (Signed)
 Pharmacy Patient Advocate Encounter  Insurance verification completed.    The patient is insured through [facility]. Patient has Medicare and is not eligible for a copay card, but may be able to apply for patient assistance or Medicare RX Payment Plan (Patient Must reach out to their plan, if eligible for payment plan), if available.    Ran test claim for Jardiance 10mg  and the current 30 day co-pay is $0.  Ran test claim for Farxiga 10mg  and the current 30 day co-pay is $0.   This test claim was processed through [employer]- copay amounts may vary at other pharmacies due to [employer], or as the patient moves through the different stages of their insurance plan.

## 2024-06-04 NOTE — Progress Notes (Signed)
 [address]       [name] [address]             [phone]                 1 Day Post-Op Procedure(s) (LRB): REMOVAL, CARDIAC ASSIST DEVICE, IMPELLA (N/A) ECHOCARDIOGRAM, TRANSESOPHAGEAL, INTRAOPERATIVE (N/A)   Events: No events _______________________________________________________________ Vitals: BP 101/83 (BP Location: Right Arm)   Pulse (!) 105   Temp 98.6 F (37 C) (Oral)   Resp (!) 25   Ht 6' 1 (1.854 m)   Wt 87.1 kg   SpO2 99%   BMI 25.33 kg/m  Filed Weights   06/02/24 0500 06/03/24 0425 06/04/24 0500  Weight: 88.4 kg 88.8 kg 87.1 kg     - Neuro: sedated  - Cardiovascular: sinus  Drips:none .   CVP:  [0 mmHg-18 mmHg] 9 mmHg  - Pulm:     ABG    Component Value Date/Time   PHART 7.438 06/01/2024 1503   PCO2ART 30.8 (L) 06/01/2024 1503   PO2ART 78 (L) 06/01/2024 1503   HCO3 20.7 06/01/2024 1503   TCO2 22 06/01/2024 1503   ACIDBASEDEF 3.0 (H) 06/01/2024 1503   O2SAT 58.7 06/04/2024 0433    - Abd: ND - Extremity: warm  .Intake/Output      11/11 0701 11/12 0700 11/12 0701 11/13 0700   P.O.     I.V. (mL/kg) 578.4 (6.6)    Other 70    IV Piggyback 511.1    Total Intake(mL/kg) 1159.5 (13.3)    Urine (mL/kg/hr) 2175 (1)    Stool 0    Blood 50    Total Output 2225    Net -1065.5         Urine Occurrence 1 x    Stool Occurrence 1 x       _______________________________________________________________ Labs:    Latest Ref Rng & Units 06/04/2024    4:35 AM 06/03/2024    4:15 AM 06/02/2024    3:56 PM  CBC  WBC 4.0 - 10.5 K/uL 9.0  7.0  6.9   Hemoglobin 13.0 - 17.0 g/dL 9.2  9.2  8.8   Hematocrit 39.0 - 52.0 % 27.8  27.8  27.1   Platelets 150 - 400 K/uL 204  145  119       Latest Ref Rng & Units 06/04/2024    4:35 AM 06/03/2024    4:15 PM 06/03/2024    4:15 AM  CMP  Glucose 70 - 99 mg/dL 893   885   BUN 6 - 20 mg/dL 19   13   Creatinine 9.38 - 1.24 mg/dL 8.33   8.29   Sodium 864 - 145 mmol/L 142    141   Potassium 3.5 - 5.1 mmol/L 4.2  4.2  3.3   Chloride 98 - 111 mmol/L 106   104   CO2 22 - 32 mmol/L 21   22   Calcium  8.9 - 10.3 mg/dL 9.0   8.8   Total Protein 6.5 - 8.1 g/dL 7.0   7.4   Total Bilirubin 0.0 - 1.2 mg/dL 1.3   2.0   Alkaline Phos 38 - 126 U/L 100   72   AST 15 - 41 U/L 140   86   ALT 0 - 44 U/L 126   62     CXR: -  _______________________________________________________________  Assessment and Plan: POD 7 s/p CABG, central VA ECMO, POD6 s/p 5.5 impella placement  Neuro: pain controlled CV: on A/S/BB Pulm: pulm hygiene Renal: creat stable GI: on diet Heme: stable ID: afebrile Endo: SSI Dispo: floor   Thomas [name] O Thomas [name] 06/04/2024 9:19 AM

## 2024-06-04 NOTE — Progress Notes (Signed)
 Speech Language Pathology Treatment: Dysphagia  Patient Details Name: Thomas [name] MRN: [id number] DOB: [birth year] Today's Date: 06/04/2024 Time: 1510-     Assessment / Plan / Recommendation Clinical Impression  Thomas [name] is doing very well with swallowing. Diet was advanced to regular solids/thin liquids. He shows no s/s of aspiration; mastication is thorough; swallow appears to be brisk.  Voice is strong/good volume; strong cough. There are no further concerns for dysphagia. Our service will sign off.   HPI HPI: Thomas [name] is a 43 year old male admitted 05/27/2024 with chest pain and dx of lateral STEMI. Underwent PTCA to ostial LAD, 11/5 CABG x 2 complicated by cardiogenic shock requiring central ECMO cannulation to come off bypass. ETT 11/5-11/9. Ileus with NG for suctioning; clamped 11/10. SLP swallow eval permitted 11/10, okay for clear liquid diet if protecting airway. PMHx CAD c/b cardiac arrest s/p PCI to OM1 in 2017, s/p right nephrectomy, CKD stage IIIa, HIV (on HAART), Hogdkin's lymphoma (s/p chemo now in remission)      SLP Plan  Discharge SLP treatment due to (comment)          Recommendations  Diet recommendations: Regular;Thin liquid Liquids provided via: Cup;Straw Medication Administration: Whole meds with liquid Supervision: Patient able to self feed                  Oral care BID     Dysphagia, unspecified (R13.10)     Discharge SLP treatment due to (comment)    [name] L. Vona, MA CCC/SLP Clinical Specialist - Acute Care SLP Acute Rehabilitation Services Office number [phone]  [name]  06/04/2024, 3:43 PM

## 2024-06-04 NOTE — Progress Notes (Signed)
  Echocardiogram 2D Echocardiogram has been performed.  Thomas [name] 06/04/2024, 9:24 AM

## 2024-06-04 NOTE — Progress Notes (Signed)
 NAME:  Thomas [name], MRN:  [id number], DOB:  [birth year], LOS: 8 ADMISSION DATE:  05/27/2024, CONSULTATION DATE:  05/28/2024 REFERRING MD:  Dr. Shyrl, CHIEF COMPLAINT:  s/p CABG, post-op vent management  History of Present Illness:  [name] is a 43 year old male with history of CAD c/b cardiac arrest s/p PCI to OM1 in 2017, s/p right nephrectomy, CKD stage IIIa, HIV (on HAART), Hogdkin's lymphoma (s/p chemo now in remission) who was admitted 05/27/2024 with chest pain and found to have lateral STEMI. He underwent LHC which showed 20% distal LM, 99% ostial LAD, 30% mid LCx, and 30% proximal RCA disease with likely chronically occluded mid LAD and OM1 stent. He underwent successful PTCA to ostial LAD and IABP placement for stabilization. TCTS was consulted for consideration of CABG.  On 11/5 he underwent CABGx2 (LIMA-diag, SVG-OM) with Dr. Shyrl. Intra-op course notable for vasoplegia and cardioplegia with inability to come off bypass due to hypotension. Patient transitioned to central [facility] ECMO via RA and aortic cannulation and was transferred to [facility] ICU for further management.    Intra-op he received 710 mL cell saver, 2 PRBC and 1.5L LR. UOP 575 mL. Aortic cross clamp time 53 minutes, CPB time 119 minutes. TEE showed normal RV, LV dysfunction. Pre-op EF 30-35% on epi and IABP.   Pertinent  Medical History  Per above  Significant Hospital Events: Including procedures, antibiotic start and stop dates in addition to other pertinent events   11/4 admitted for lateral STEMI underwent successful PTCA to ostial LAD  11/5 CABG x 2 (LIMA>Lcx, SVG> OM) complicated by cardiogenic shock requiring central ECMO cannulation to come off bypass.  11/6 impella 5.5 placed, bronch for mucus plugging entire R lung 11/7 decannulated from ECMO, increased inotropes. Mucus plug entire R lung> bronch 11/8 failed SBT 11/9 extubated; still high NGT output 11/10 advanced to clear liquid diet, tolerating well,  NGT removed  11/11 OR for impella 5.5 removal  Interim History / Subjective:  OR yesterday for impella 5.5 removal. Milrinone weaned off. Ambulating multiple times daily. Denies CP or constipation.   Objective    Blood pressure 101/83, pulse (!) 105, temperature 98.6 F (37 C), temperature source Oral, resp. rate (!) 25, height 6' 1 (1.854 m), weight 87.1 kg, SpO2 99%. CVP:  [0 mmHg-18 mmHg] 9 mmHg      Intake/Output Summary (Last 24 hours) at 06/04/2024 9177 Last data filed at 06/04/2024 0600 Gross per 24 hour  Intake 1014.81 ml  Output 2225 ml  Net -1210.19 ml   Filed Weights   06/02/24 0500 06/03/24 0425 06/04/24 0500  Weight: 88.4 kg 88.8 kg 87.1 kg    Examination: General: no acute distress, resting in bed comfortably  HENT: [place]/AT, sclera anicteric  Lungs: breathing comfortably on room air, clear to auscultation bilaterally Cardiovascular: sinus tachycardia, normal S1 and S2 Abdomen: soft, non-tender, non-distended, +bowel sounds Extremities: warm, dry, no edema, right axillary impella site dressed with gauze and medipore tape, c/d/I, MSI open to air without erythema, stepdowns or drainage Neuro: alert and responsive, no focal deficits GU: deferred  I/O -1.0L/24 hours; -5.7L net for admission  Resolved problem list  AKI Post-operative mechanical ventilation  Ileus Acute post-bypass vasoplegia Expected post-operative consumptive thrombocytopenia, resolved   Assessment and Plan   Acute post-bypass cardiogenic shock requiring central VA ECMO > decannulated 11/7 > Impella 5.5 removed 11/11 CAD s/p CABG x2 (LIMA-diag, SVG-OM) Acute lateral STEMI s/p PCI and IABP  Impella removed yesterday and milrinone weaned  off. Co-ox this morning 58.7. - Post-op care per TCTS - GDMT initiation as indicated per advanced heart failure - Continue ASA and statin. Monitor LFTs - Monitor electrolytes, replete as needed - Diuresis per advanced heart failure   Mucus plugging-- RLL  still consolidated, possible pneumonia Currently breathing comfortably on room air. BAL 11/6 no growth x 3 days. BAL 11/7 no growth x 2 days. CXR this morning with improving bibasilar atelectasis and mild pulmonary venous congestion. - Encourage IS/pulmonary toilet  - Will complete 5 day course of Zosyn     CKD stage IIIa S/p right nephrectomy (04/30/2014) for nonresolving nephrolithiasis Baseline Cr ~ 1.7-2.0. Cr remains within baseline. Autodiuresing.  - strict I/O - renally dose meds, avoid nephrotoxic meds  Elevated transaminases - continue to monitor hepatic function panel if continuing to rise would hold statin and consider RUQ US  - follow-up echo today   Expected post-operative acute blood loss anemia - transfuse for Hb <7 or hemodynamically significant bleeding - continue to monitor daily CBC  Acute post-operative pain, improved  Anxiety and agitation, resolved - Stop nightly seroquel, will add melatonin PRN while in the hospital  - Continue pain control with lidocaine  patch, PRN tramadol and oxycodone    Deconditioning, improving  - Continue PT, OT - progressive mobility  History of HIV; has had undetectable VL since 2019 - On cebenuva at home; due for next injection ~06/30/2024   At risk for stress hyperglycemia - Stop SSI, continue CBG checks TID AC for now if controlled can likely check on AM BMP only  - CBG goal 140-180   Labs   CBC: Recent Labs  Lab 06/01/24 1505 06/02/24 0333 06/02/24 1556 06/03/24 0415 06/04/24 0435  WBC 10.1 8.1 6.9 7.0 9.0  HGB 10.0* 9.4* 8.8* 9.2* 9.2*  HCT 29.6* 28.1* 27.1* 27.8* 27.8*  MCV 93.4 94.6 96.4 95.2 96.5  PLT 109* 102* 119* 145* 204    Basic Metabolic Panel: Recent Labs  Lab 05/30/24 0449 05/30/24 0456 05/31/24 1730 06/01/24 0414 06/01/24 0423 06/01/24 1503 06/01/24 1505 06/02/24 0333 06/02/24 1556 06/03/24 0415 06/03/24 1615 06/04/24 0435  NA 139   < > 133*   < > 134*   < > 140 140 140 141  --  142  K 4.0    < > 4.1   < > 3.5   < > 4.0 3.7 3.6 3.3* 4.2 4.2  CL 107   < > 101  --  100  --  102 106 104 104  --  106  CO2 20*   < > 19*  --  20*  --  21* 23 24 22   --  21*  GLUCOSE 124*   < > 149*  --  122*  --  108* 100* 170* 114*  --  106*  BUN 8   < > 9  --  8  --  11 12 11 13   --  19  CREATININE 1.40*   < > 1.49*  --  1.46*  --  1.64* 1.60* 1.39* 1.70*  --  1.66*  CALCIUM  7.6*   < > 7.7*  --  7.8*  --  9.1 9.1 8.9 8.8*  --  9.0  MG 1.9   < > 2.2  --  2.1  --   --  2.8*  --  2.3  --  2.4  PHOS 3.0  --   --   --  2.7  --   --   --   --   --   --   --    < > =  values in this interval not displayed.   GFR: Estimated Creatinine Clearance: 66.2 mL/min (A) (by C-G formula based on SCr of 1.66 mg/dL (H)). Recent Labs  Lab 05/29/24 1846 05/30/24 0449 05/30/24 0502 05/30/24 1322 05/30/24 1907 05/31/24 0403 05/31/24 0634 05/31/24 1752 06/02/24 0333 06/02/24 1556 06/03/24 0415 06/04/24 0435  WBC 7.2   < >  --    < >  --    < >  --    < > 8.1 6.9 7.0 9.0  LATICACIDVEN 0.8  --  0.5  --  1.1  --  0.8  --   --   --   --   --    < > = values in this interval not displayed.    Critical care time:      Rexene LOISE Tanda DEVONNA [facility] 06/04/24 8:22 AM  Please see [email or web address] for pager details.  From 7A-7P if no response, please call [phone] After hours, please call ELink [phone]

## 2024-06-04 NOTE — Plan of Care (Signed)
  Problem: Education: Goal: Knowledge of General Education information will improve Description: Including pain rating scale, medication(s)/side effects and non-pharmacologic comfort measures Outcome: Progressing   Problem: Health Behavior/Discharge Planning: Goal: Ability to manage health-related needs will improve Outcome: Progressing   Problem: Clinical Measurements: Goal: Ability to maintain clinical measurements within normal limits will improve Outcome: Progressing Goal: Will remain free from infection Outcome: Progressing Goal: Diagnostic test results will improve Outcome: Progressing Goal: Respiratory complications will improve Outcome: Progressing Goal: Cardiovascular complication will be avoided Outcome: Progressing   Problem: Activity: Goal: Risk for activity intolerance will decrease Outcome: Progressing   Problem: Nutrition: Goal: Adequate nutrition will be maintained Outcome: Progressing   Problem: Coping: Goal: Level of anxiety will decrease Outcome: Progressing   Problem: Elimination: Goal: Will not experience complications related to bowel motility Outcome: Progressing Goal: Will not experience complications related to urinary retention Outcome: Progressing   Problem: Pain Managment: Goal: General experience of comfort will improve and/or be controlled Outcome: Progressing   Problem: Safety: Goal: Ability to remain free from injury will improve Outcome: Progressing   Problem: Skin Integrity: Goal: Risk for impaired skin integrity will decrease Outcome: Progressing   Problem: Education: Goal: Understanding of CV disease, CV risk reduction, and recovery process will improve Outcome: Progressing Goal: Individualized Educational Video(s) Outcome: Progressing   Problem: Activity: Goal: Ability to return to baseline activity level will improve Outcome: Progressing   Problem: Cardiovascular: Goal: Ability to achieve and maintain adequate  cardiovascular perfusion will improve Outcome: Progressing Goal: Vascular access site(s) Level 0-1 will be maintained Outcome: Progressing   Problem: Health Behavior/Discharge Planning: Goal: Ability to safely manage health-related needs after discharge will improve Outcome: Progressing   Problem: Cardiac: Goal: Ability to achieve and maintain adequate cardiopulmonary perfusion will improve Outcome: Progressing Goal: Vascular access site(s) Level 0-1 will be maintained Outcome: Progressing   Problem: Fluid Volume: Goal: Ability to achieve a balanced intake and output will improve Outcome: Progressing   Problem: Physical Regulation: Goal: Complications related to the disease process, condition or treatment will be avoided or minimized Outcome: Progressing   Problem: Respiratory: Goal: Will regain and/or maintain adequate ventilation Outcome: Progressing   Problem: Education: Goal: Will demonstrate proper wound care and an understanding of methods to prevent future damage Outcome: Progressing Goal: Knowledge of disease or condition will improve Outcome: Progressing Goal: Knowledge of the prescribed therapeutic regimen will improve Outcome: Progressing Goal: Individualized Educational Video(s) Outcome: Progressing   Problem: Activity: Goal: Risk for activity intolerance will decrease Outcome: Progressing   Problem: Cardiac: Goal: Will achieve and/or maintain hemodynamic stability Outcome: Progressing   Problem: Clinical Measurements: Goal: Postoperative complications will be avoided or minimized Outcome: Progressing   Problem: Respiratory: Goal: Respiratory status will improve Outcome: Progressing   Problem: Skin Integrity: Goal: Wound healing without signs and symptoms of infection Outcome: Progressing Goal: Risk for impaired skin integrity will decrease Outcome: Progressing   Problem: Urinary Elimination: Goal: Ability to achieve and maintain adequate renal  perfusion and functioning will improve Outcome: Progressing

## 2024-06-04 NOTE — Plan of Care (Addendum)
     Referral previously received for Thomas [name] for goals of care discussion. Noted most recent palliative in-person assessment dated 06/03/2024 at which time it was recommended to follow from a distance/chart check today (ICU/high risk for decline) and in person follow-up tomorrow.   Chart reviewed for Recent provider notes, nurse notes, TOC notes, vitals, labs, and imaging and updates received from RN.   At this time patient appears stable/significantly improved.  Impella was successfully removed yesterday, CVP and coags are good off milrinone.  Has been ambulating with PT/OT.  Plans to transfer out of ICU today.  No plan for in person follow-up today, given transfer out of ICU today and significant improvement will plan to see in person Friday 06/06/24. Please contact the palliative medicine provider on service for any new/urgent needs that require our assistance with this patient.  Thank you for your referral and allowing PMT to assist in Thomas [name]'s care.   Thomas Kays, NP Palliative Medicine Team Phone: [phone]  NO CHARGE

## 2024-06-05 ENCOUNTER — Other Ambulatory Visit (HOSPITAL_COMMUNITY): Payer: Self-pay

## 2024-06-05 DIAGNOSIS — I2102 ST elevation (STEMI) myocardial infarction involving left anterior descending coronary artery: Secondary | ICD-10-CM | POA: Diagnosis not present

## 2024-06-05 LAB — BASIC METABOLIC PANEL WITH GFR
Anion gap: 11 (ref 5–15)
BUN: 19 mg/dL (ref 6–20)
CO2: 21 mmol/L — ABNORMAL LOW (ref 22–32)
Calcium: 8.6 mg/dL — ABNORMAL LOW (ref 8.9–10.3)
Chloride: 107 mmol/L (ref 98–111)
Creatinine, Ser: 1.66 mg/dL — ABNORMAL HIGH (ref 0.61–1.24)
GFR, Estimated: 53 mL/min — ABNORMAL LOW (ref >60)
Glucose, Bld: 90 mg/dL (ref 70–99)
Potassium: 3.6 mmol/L (ref 3.5–5.1)
Sodium: 139 mmol/L (ref 135–145)

## 2024-06-05 LAB — HEPATIC FUNCTION PANEL
ALT: 188 U/L — ABNORMAL HIGH (ref 0–44)
AST: 138 U/L — ABNORMAL HIGH (ref 15–41)
Albumin: 2.9 g/dL — ABNORMAL LOW (ref 3.5–5.0)
Alkaline Phosphatase: 85 U/L (ref 38–126)
Bilirubin, Direct: 0.4 mg/dL — ABNORMAL HIGH (ref 0.0–0.2)
Indirect Bilirubin: 0.9 mg/dL (ref 0.3–0.9)
Total Bilirubin: 1.3 mg/dL — ABNORMAL HIGH (ref 0.0–1.2)
Total Protein: 6.8 g/dL (ref 6.5–8.1)

## 2024-06-05 LAB — CBC
HCT: 29.2 % — ABNORMAL LOW (ref 39.0–52.0)
Hemoglobin: 9.8 g/dL — ABNORMAL LOW (ref 13.0–17.0)
MCH: 32.1 pg (ref 26.0–34.0)
MCHC: 33.6 g/dL (ref 30.0–36.0)
MCV: 95.7 fL (ref 80.0–100.0)
Platelets: 282 K/uL (ref 150–400)
RBC: 3.05 MIL/uL — ABNORMAL LOW (ref 4.22–5.81)
RDW: 15 % (ref 11.5–15.5)
WBC: 8.6 K/uL (ref 4.0–10.5)
nRBC: 0 % (ref 0.0–0.2)

## 2024-06-05 LAB — COOXEMETRY PANEL
Carboxyhemoglobin: 1.3 % (ref 0.5–1.5)
Methemoglobin: <0.7 % (ref 0.0–1.5)
O2 Saturation: 60.3 %
Total hemoglobin: 10.7 g/dL — ABNORMAL LOW (ref 12.0–16.0)

## 2024-06-05 LAB — GLUCOSE, CAPILLARY: Glucose-Capillary: 101 mg/dL — ABNORMAL HIGH (ref 70–99)

## 2024-06-05 LAB — MAGNESIUM: Magnesium: 1.9 mg/dL (ref 1.7–2.4)

## 2024-06-05 MED ORDER — CLOPIDOGREL BISULFATE 75 MG PO TABS
75.0000 mg | ORAL_TABLET | Freq: Every day | ORAL | 11 refills | Status: DC
  Filled 2024-06-05: qty 30, 30d supply, fill #0

## 2024-06-05 MED ORDER — METOPROLOL SUCCINATE ER 25 MG PO TB24
12.5000 mg | ORAL_TABLET | Freq: Every day | ORAL | 2 refills | Status: DC
  Filled 2024-06-05: qty 30, 60d supply, fill #0

## 2024-06-05 MED ORDER — OXYCODONE HCL 5 MG PO TABS
5.0000 mg | ORAL_TABLET | Freq: Four times a day (QID) | ORAL | 0 refills | Status: AC | PRN
  Filled 2024-06-05: qty 28, 7d supply, fill #0

## 2024-06-05 MED ORDER — SPIRONOLACTONE 25 MG PO TABS
12.5000 mg | ORAL_TABLET | Freq: Every day | ORAL | 3 refills | Status: DC
  Filled 2024-06-05: qty 30, 60d supply, fill #0

## 2024-06-05 MED ORDER — DAPAGLIFLOZIN PROPANEDIOL 10 MG PO TABS
10.0000 mg | ORAL_TABLET | Freq: Every day | ORAL | 2 refills | Status: DC
  Filled 2024-06-05: qty 30, 30d supply, fill #0

## 2024-06-05 MED ORDER — METOPROLOL SUCCINATE ER 25 MG PO TB24
12.5000 mg | ORAL_TABLET | Freq: Every day | ORAL | Status: DC
  Administered 2024-06-05: 12.5 mg via ORAL
  Filled 2024-06-05: qty 1

## 2024-06-05 MED ORDER — ASPIRIN 81 MG PO CHEW
81.0000 mg | CHEWABLE_TABLET | Freq: Every day | ORAL | 3 refills | Status: AC
  Filled 2024-06-05: qty 100, 100d supply, fill #0

## 2024-06-05 NOTE — Progress Notes (Addendum)
      [address] [employer] [address]             [phone]        2 Days Post-Op Procedure(s) (LRB): REMOVAL, CARDIAC ASSIST DEVICE, IMPELLA (N/A) ECHOCARDIOGRAM, TRANSESOPHAGEAL, INTRAOPERATIVE (N/A)  Subjective:  Patient feels great.  He wants to go home.  He states he can't sleep this bed is probably what you get in prison.  His mother will be providing help at discharge  Objective: Vital signs in last 24 hours: Temp:  [98.2 F (36.8 C)-98.8 F (37.1 C)] 98.3 F (36.8 C) (11/13 0345) Pulse Rate:  [89-105] 99 (11/13 0345) Cardiac Rhythm: Sinus tachycardia (11/12 2033) Resp:  [14-27] 25 (11/13 0609) BP: (101-130)/(72-93) 116/72 (11/13 0345) SpO2:  [95 %-100 %] 95 % (11/13 0345) Weight:  [87.9 kg] 87.9 kg (11/13 0609)  Hemodynamic parameters for last 24 hours: CVP:  [4 mmHg-16 mmHg] 5 mmHg  Intake/Output from previous day: 11/12 0701 - 11/13 0700 In: 100 [IV Piggyback:100] Out: 700 [Urine:700]  General appearance: alert, cooperative, and no distress Heart: regular rate and rhythm Lungs: clear to auscultation bilaterally Abdomen: soft, non-tender; bowel sounds normal; no masses,  no organomegaly Extremities: edema trace Wound: clean and dry  Lab Results: Recent Labs    06/04/24 0435 06/05/24 0650  WBC 9.0 8.6  HGB 9.2* 9.8*  HCT 27.8* 29.2*  PLT 204 282   BMET:  Recent Labs    06/04/24 0435 06/05/24 0650  NA 142 139  K 4.2 3.6  CL 106 107  CO2 21* 21*  GLUCOSE 106* 90  BUN 19 19  CREATININE 1.66* 1.66*  CALCIUM  9.0 8.6*    PT/INR:  Recent Labs    06/03/24 0415  LABPROT 15.4*  INR 1.2   ABG    Component Value Date/Time   PHART 7.438 06/01/2024 1503   HCO3 20.7 06/01/2024 1503   TCO2 22 06/01/2024 1503   ACIDBASEDEF 3.0 (H) 06/01/2024 1503   O2SAT 60.3 06/05/2024 0650   CBG (last 3)  Recent Labs    06/04/24 1607 06/04/24 2118 06/05/24 0604  GLUCAP 83 90 101*    Assessment/Plan: S/P Procedure(s)  (LRB): REMOVAL, CARDIAC ASSIST DEVICE, IMPELLA (N/A) ECHOCARDIOGRAM, TRANSESOPHAGEAL, INTRAOPERATIVE (N/A)  CV- Sinus Tach, BP is controlled- currently on Aldactone, Farxiga.. AHF optimizing GDMT Pulm- no acute issues Renal- creatinine stable at 1.66, weight is below baseline.. no current indication for Lasix Elevated LFTs- stable, due to post operative shock ID- afebrile, on Zosyn  for possible aspiration Deconditioning- patient does not wish to go to CIR.. he has help and would like to go home, will arranged home health PT/OT  Dispo- patient feeling great, really wants to go home.. needs to be fully optimized on GDMT prior to discharge... will await AHF eval for today    LOS: 9 days   Rocky Shad, PA-C 06/05/24 7:58 AM  Agree  Dispo planning  Edee Nifong MALVA Rayas

## 2024-06-05 NOTE — Progress Notes (Signed)
 LA TL PICC removed per protocol per MD order. Manual pressure applied for 3 mins. Vaseline gauze, gauze, and Tegaderm applied over insertion site. No bleeding or swelling noted. Instructed patient to remain in bed for thirty mins. Educated patient about S/S of infection and when to call MD; no heavy lifting or pressure on left side for 24 hours; keep dressing dry and intact for 24 hours. Pt verbalized comprehension.

## 2024-06-05 NOTE — Progress Notes (Signed)
 Daily Progress Note   Date: 06/05/2024   Patient Name: Thomas [name]  DOB: Jun 13, 1981  MRN: [id number]  Age / Sex: 43 y.o., male  Attending Physician: Thomas Linnie KIDD, MD Primary Care Physician: Pcp, No Admit Date: 05/27/2024 Length of Stay: 9 days  Reason for Follow-up: Psychosocial/spiritual support  Past Medical History:  Diagnosis Date   Acute respiratory failure with hypoxemia: post cardiac arrest 05/19/2014   Cancer (HCC)    hodgkins, no current treatment for   Cardiac arrest (HCC) 05/15/2014   Chronic renal insufficiency    CKD (chronic kidney disease) s/p Right nephrectomy (04/30/2014) 05/19/2014   CLABSI (central line-associated bloodstream infection) 02/27/2014   Enteritis 01/22/2014   History of blood transfusion july 2015 per pt   HIV disease (HCC) 06/25/2012   Hydronephrosis, right & hydroureter: secondary to obstructing ureteral calculi 03/01/2014   Leg pain 07/03/2012   Nephrolithiasis    NSTEMI (non-ST elevated myocardial infarction) (HCC) 05/19/2014   Obstructive uropathy 12/12/2014   Status post left renal stent   Penile cyst 08/06/2012   Septic shock (HCC) 01/18/2014   Shingles rash 02/25/2014   Sinus tachycardia 08/06/2012   Skin lesion 07/03/2012   Staphylococcus aureus bacteremia 02/25/2014   Transaminitis 05/19/2014    Subjective:   Subjective: Chart Reviewed. Updates received. Patient Assessed. Created space and opportunity for patient  and family to explore thoughts and feelings regarding current medical situation.  Today's Discussion: Today before meeting with the patient/family, I reviewed the chart notes including discharge summary from yesterday, SLP note from today, OT note from today, CVTS note from today, cardiology note from today, rehab note from today, TOC note from today. I also reviewed vital signs, nursing flowsheets, medication administrations record, labs, and imaging. Labs reviewed include HFP which shows stable bilirubin at  1.3 today, 1.3 yesterday, stable but persistently mildly elevated transaminases with AST/ALT 138/188 compared to 140/126 yesterday in the setting of recent ECMO with reduced EF now apparently normal (LVEF 50 to 55% on echo yesterday).  BMP shows potassium at 3.6 which has been stable over the past 2 days, was hypokalemic two days ago at 3.3.  Creatinine stable at 1.66 today, same as yesterday, in the setting of sepsis, reduced EF/heart failure recently on ECMO and recently decannulated, now with normal EF and off all devices.  Today saw the patient at bedside, he is sitting at the edge of the bed holding onto DME.  He states that he feels great today, they are discharging him home soon.  Denies chest pain, dyspnea.  He states he is not working with PT and is doing really well.  He is able to go home without any therapy needs.  We spent time talking about nutrition and eating for health including low-sodium diet, heart healthy diet.  We talked about options for cooking and preparing food as well as seasoning food without excessive sodium (Mrs. Thomas [name], grilling/boiling rather than frying, etc.).  He states that he and his mom have committed to each other to eat healthier and he seems quite motivated.  He expressed sincere appreciation for all the care he has received to the hospital and was understandably emotional.  I gave him a hug and wished him the best.  I provided emotional and general support through therapeutic listening, empathy, sharing of stories, and other techniques. I answered all questions and addressed all concerns to the best of my ability.  Review of Systems  Constitutional:  Negative for fatigue.  Respiratory:  Negative for  shortness of breath.   Gastrointestinal:  Negative for abdominal pain, nausea and vomiting.  Neurological:  Negative for weakness.    Objective:   Primary Diagnoses: Present on Admission:  STEMI involving left anterior descending coronary artery (HCC)  Cardiogenic  shock (HCC)  Elevated transaminase level   Vital Signs:  BP 121/84 (BP Location: Right Arm)   Pulse 93   Temp 98.1 F (36.7 C) (Oral)   Resp 17   Ht 6' 1 (1.854 m)   Wt 87.9 kg   SpO2 97%   BMI 25.56 kg/m   Physical Exam Vitals and nursing note reviewed.  Constitutional:      General: He is not in acute distress.    Appearance: He is ill-appearing.  HENT:     Head: Normocephalic and atraumatic.  Cardiovascular:     Rate and Rhythm: Normal rate.  Pulmonary:     Effort: Pulmonary effort is normal. No respiratory distress.  Abdominal:     General: Abdomen is flat. Bowel sounds are normal. There is no distension.     Palpations: Abdomen is soft.  Skin:    General: Skin is warm and dry.  Neurological:     General: No focal deficit present.     Mental Status: He is alert.  Psychiatric:        Mood and Affect: Mood normal.        Behavior: Behavior normal.     Palliative Assessment/Data: 90%   Existing Vynca/ACP Documentation: None  Assessment & Plan:   HPI/Patient Profile:  43 y.o. male  with past medical history of CAD c/b cardiac arrest s/p PCI to OM1 in 2017, s/p right nephrectomy, CKD stage IIIa, HIV (on HAART), Hogdkin's lymphoma (s/p chemo now in remission) who was admitted 05/27/2024 with chest pain and found to have lateral STEMI.  His hospital stay has been complicated by successful PTCA to ostial LAD, CABG x 2 complicated by cardiogenic shock requiring central ECMO cannulation to come off bypass.    Palliative medicine has been consulted for ongoing support of patient's family and ECMO status.  SUMMARY OF RECOMMENDATIONS   Full code Full scope of care Anticipate discharge today  Symptom Management:  Per primary team Palliative medicine is available to assist as needed  Code Status: Full Code  Prognosis: > 12 months  Discharge Planning: Home with no needs  Discussed with: Patient, medical team, nursing team  Thank you for allowing us  to  participate in the care of Thomas [name] PMT will continue to support holistically.  Time Spent: 30 min  Detailed review of medical records (labs, imaging, vital signs), medically appropriate exam, discussed with treatment team, counseling and education to patient, family, & staff, documenting clinical information, medication management, coordination of care  Camellia Kays, NP Palliative Medicine Team  Team Phone # [phone] (Nights/Weekends)  03/22/2021, 8:17 AM

## 2024-06-05 NOTE — Progress Notes (Signed)
 Thomas [name] to be D/C'd Home per MD order.  Discussed with the patient and all questions fully answered.  VSS, Skin clean, dry and intact without evidence of skin break down, no evidence of skin tears noted. IV catheter discontinued intact. Site without signs and symptoms of complications. Dressing and pressure applied.  An After Visit Summary was printed and given to the patient. Patient received prescription.  D/c education completed with patient/family including follow up instructions, medication list, d/c activities limitations if indicated, with other d/c instructions as indicated by MD - patient able to verbalize understanding, all questions fully answered.   Patient instructed to return to ED, call 911, or call MD for any changes in condition.   Patient escorted via WC, and D/C home via private auto.  Thomas [name] Thomas [name] 06/05/2024 3:11 PM

## 2024-06-05 NOTE — TOC Transition Note (Signed)
 Transition of Care [facility]) - Discharge Note   Patient Details  Name: Thomas [name] MRN: [id number] Date of Birth: Oct 24, 1980  Transition of [facility]) CM/SW Contact:  Waddell Barnie Rama, RN Phone Number: 06/05/2024, 1:54 PM   Clinical Narrative:    For dc today, he has a follow up apt on [facility]. He states he needs a shower stool.  He does not have a preference for the agency.  NCM sent referral to [employer].   Patient states he has transportation home today.      Barriers to Discharge: Continued Medical Work up   Patient Goals and CMS Choice Patient states their goals for this hospitalization and ongoing recovery are:: wants to recover          Discharge Placement                       Discharge Plan and Services Additional resources added to the After Visit Summary for     Discharge Planning Services: CM Consult                                 Social Drivers of Health (SDOH) Interventions SDOH Screenings   Food Insecurity: Patient Declined (05/27/2024)  Housing: Low Risk  (05/27/2024)  Transportation Needs: No Transportation Needs (05/27/2024)  Utilities: Not At Risk (05/27/2024)  Depression (PHQ2-9): Low Risk  (04/30/2024)  Social Connections: Unknown (12/06/2021)   Received from [facility]  Tobacco Use: Medium Risk (06/03/2024)     Readmission Risk Interventions     No data to display

## 2024-06-05 NOTE — Progress Notes (Signed)
 Pt refusing to wait for education from Cardiac rehab. Pt educated on importance of cardiac rehab. Still wanting toi leave prior to education

## 2024-06-05 NOTE — Progress Notes (Signed)
 CARDIAC REHAB PHASE I   Attempt to see pt prior to discharge. Pt being wheeled out by discharge team. Will call at a later date.  Con KATHEE Pereyra, MS, ACSM-CEP 06/05/2024 3:19 PM

## 2024-06-05 NOTE — Progress Notes (Signed)
 Progress Note  Patient Name: NAYTHAN DOUTHIT Date of Encounter: 06/05/2024 Primary Cardiologist: [name], MD   Subjective   Overnight transitioned from AHF service.  Off ECMO 11/11, of milrinone and impella.  CVP 5, On GDMT. 2 Vessel CABG. Patient notes no CP, SOB, Palpitations. Mother, on the phone, note remarkable improvement. He is curious why he is still here.   Vital Signs    Vitals:   06/04/24 2313 06/05/24 0345 06/05/24 0609 06/05/24 0818  BP: 111/86 116/72  126/88  Pulse: 97 99  98  Resp: 20 20 (!) 25 (!) 21  Temp: 98.4 F (36.9 C) 98.3 F (36.8 C)  98.1 F (36.7 C)  TempSrc: Oral Oral  Oral  SpO2:  95%  96%  Weight:   87.9 kg   Height:        Intake/Output Summary (Last 24 hours) at 06/05/2024 0903 Last data filed at 06/05/2024 9392 Gross per 24 hour  Intake 100.04 ml  Output 700 ml  Net -599.96 ml   Filed Weights   06/03/24 0425 06/04/24 0500 06/05/24 0609  Weight: 88.8 kg 87.1 kg 87.9 kg    Physical Exam   GEN: No acute distress.   Neck: No JVD Cardiac: RRR, systolic murmur with no rubs, or gallops.  Respiratory: Clear to auscultation bilaterally. GI: Soft, nontender, non-distended  MS: No edema  Labs   Telemetry: SR PVCs in couplets (Rare)   Chemistry Recent Labs  Lab 06/03/24 0415 06/03/24 1615 06/04/24 0435 06/05/24 0650  NA 141  --  142 139  K 3.3* 4.2 4.2 3.6  CL 104  --  106 107  CO2 22  --  21* 21*  GLUCOSE 114*  --  106* 90  BUN 13  --  19 19  CREATININE 1.70*  --  1.66* 1.66*  CALCIUM  8.8*  --  9.0 8.6*  PROT 7.4  --  7.0 6.8  ALBUMIN 3.0*  --  2.9* 2.9*  AST 86*  --  140* 138*  ALT 62*  --  126* 188*  ALKPHOS 72  --  100 85  BILITOT 2.0*  --  1.3* 1.3*  GFRNONAA 51*  --  53* 53*  ANIONGAP 15  --  15 11     Hematology Recent Labs  Lab 06/03/24 0415 06/04/24 0435 06/05/24 0650  WBC 7.0 9.0 8.6  RBC 2.92* 2.88* 3.05*  HGB 9.2* 9.2* 9.8*  HCT 27.8* 27.8* 29.2*  MCV 95.2 96.5 95.7  MCH 31.5  31.9 32.1  MCHC 33.1 33.1 33.6  RDW 15.2 15.0 15.0  PLT 145* 204 282      Cardiac Studies   Cardiac Studies & Procedures   ______________________________________________________________________________________________ CARDIAC CATHETERIZATION  CARDIAC CATHETERIZATION 05/27/2024  Conclusion Images from the original result were not included. Coronary angiography 05/27/2024: LM: Distal 20% stenosis LAD: Ostial 99% stenosis (Culprit lesion) Occluded mid LAD (likely chronic) Lcx: Occluded OM1 stent (likely chronic) Mid Lcx 30% disease Left-to-left collaterals to OM1 RCA: Dominant vessel Prox 30% disease Left-to-tight collaterals to mid LAD  LVEDP 14 mmHg  Successful percutaneous coronary intervention ostial LAD PTCA with 2.0 x 8, and 2.5 x 12 mm balloons with improvement in the LAD flow Intra-aortic balloon pump placement for stabilization pending CABG    Conclusion: Severe two-vessel disease  Recommendation: Admitted to 2H. Appreciate input from Dr. Shyrl, tentative plan for CABG tomorrow  [name], MD  Findings Coronary Findings Diagnostic  Dominance: Right  Left Main Dist LM to  Ost LAD lesion is 20% stenosed.  Left Anterior Descending Collaterals Dist LAD filled by collaterals from RPDA.  Ost LAD lesion is 99% stenosed. Mid LAD lesion is 100% stenosed.  Left Circumflex Mid Cx lesion is 30% stenosed.  First Obtuse Marginal Branch Vessel is large in size. Collaterals 1st Mrg filled by collaterals from 3rd Mrg.  Non-stenotic Ost 1st Mrg to 1st Mrg lesion was previously treated. High OM1 stent protruded into the native Cx  Covering the AV grove circumflex. 1st Mrg lesion is 100% stenosed. The lesion was previously treated .  Second Obtuse Marginal Branch Vessel is small in size.  Right Coronary Artery Prox RCA lesion is 20% stenosed. The lesion is segmental.  Right Posterior Descending Artery Vessel is small in size.  Right  Posterior Atrioventricular Artery Vessel is small in size. Non-stenotic RPAV lesion. The lesion is segmental.  Intervention  Ost LAD lesion Angioplasty CATH LAUNCHER 6FR EBU 3 guide catheter was inserted. WIRE RUNTHROUGH .[id number] guidewire used to cross lesion. Balloon angioplasty was performed using a BALLOON EMERGE MR 2.0X8. Maximum pressure: 10 atm. Inflation time: 10 sec.  A second ballloon was used, using a standard  BALLOON EMERGE MR 2.5X12. Maximum pressure:  8 atm. Inflation time:  10 sec. Post-Intervention Lesion Assessment The intervention was successful. Pre-interventional TIMI flow is 0. Post-intervention TIMI flow is 2. No complications occurred at this lesion. There is a 50% residual stenosis post intervention.   CARDIAC CATHETERIZATION  CARDIAC CATHETERIZATION 10/28/2015  Conclusion 1. Severe diffuse coronary artery disease. Small vessels. 2. Previously placed stent in the high OM branch, that protrudes into the native ostial circumflex coronary artery is widely patent. The native circumflex mid to distal segment has diffuse disease. There is tandem 60 and 70% stenosis which in some views appears to be 80%. Small vessel and diffusely diseased. 3. Diffuse coronary artery disease involving the RCA specifically PL branch of the RCA which is diffusely diseased and small. Has 70% diffuse disease. Proximal RCA has 40% stenosis. Catheter induced spasm was evident relieved with intracoronary nitroglycerin  at this site. 4. Mild disease in the LAD. 5. 20-25 mL of contrast utilized for diagnostic angiography.  Rec: Medical therapy with aggressive risk factor reduction.  If he continues to have exertional chest discomfort, I will consider angioplasty to the AV groove circumflex coronary artery.  Findings Coronary Findings Diagnostic  Dominance: Right  Left Circumflex  Diffuse. Prior scoring balloon PTCA site widely patent  First Obtuse Marginal Branch The vessel is large in  size. Previously placed Ost 1st Mrg to 1st Mrg drug eluting stent is patent. High OM1 stent protruded into the native Cx  Covering the AV grove circumflex.  Second Obtuse Marginal Branch The vessel is small in size.  Right Coronary Artery Diffuse.  Right Posterior Descending Artery The vessel is small in size.  Right Posterior Atrioventricular Artery The vessel is small in size. Diffuse.  Intervention  No interventions have been documented.   STRESS TESTS  PCV MYOCARDIAL PERFUSION WO LEXISCAN  08/06/2019  Narrative Lexiscan  (Walking with mod Bruce)Tetrofosmin  Stress Test  08/06/2019: Nondiagnostic ECG stress. There is a moderate sized  reversible defect in the anterior, septal and apical regions extending from the mid ventricle to apex. Overall function is abnormal with regional wall motion abnormalities, anterior wall hypokinesis. Stress LV EF: 51%. No previous exam available for comparison. Intermediate to high risk study.   ECHOCARDIOGRAM  ECHOCARDIOGRAM COMPLETE 06/04/2024  Narrative ECHOCARDIOGRAM REPORT    Patient Name:   JAMILL WETMORE  Champney Date of Exam: 06/04/2024 Medical Rec #:  [id number]        Height:       73.0 in Accession #:    [id number]       Weight:       192.0 lb Date of Birth:  December 26, 1980         BSA:          2.115 m Patient Age:    43 years         BP:           101/83 mmHg Patient Gender: M                HR:           98 bpm. Exam Location:  Inpatient  Procedure: 2D Echo and Strain Analysis (Both Spectral and Color Flow Doppler were utilized during procedure).  Indications:    CHF  History:        Patient has prior history of Echocardiogram examinations. CAD; Prior CABG.  Sonographer:    Charmaine Gaskins Referring Phys: [phone] DALTON S MCLEAN  IMPRESSIONS   1. Left ventricular ejection fraction, by estimation, is 50 to 55%. The left ventricle has low normal function. The left ventricle demonstrates regional wall motion abnormalities with  distal anterior/anterolateral and apical hypokinesis. Left ventricular diastolic parameters were normal. The average left ventricular global longitudinal strain is -13.4 %. The global longitudinal strain is abnormal. 2. Moderate MR by regurgitant volume with restricted posterior leaflet. The mitral valve is degenerative. Moderate mitral valve regurgitation. No evidence of mitral stenosis. 3. The aortic valve is tricuspid. Aortic valve regurgitation is not visualized. No aortic stenosis is present. 4. Right ventricular systolic function is normal. The right ventricular size is normal. 5. The inferior vena cava is normal in size with greater than 50% respiratory variability, suggesting right atrial pressure of 3 mmHg.  FINDINGS Left Ventricle: Left ventricular ejection fraction, by estimation, is 50 to 55%. The left ventricle has low normal function. The left ventricle demonstrates regional wall motion abnormalities. The average left ventricular global longitudinal strain is -13.4 %. Strain was performed and the global longitudinal strain is abnormal. The left ventricular internal cavity size was normal in size. There is no left ventricular hypertrophy. Left ventricular diastolic parameters were normal.  Right Ventricle: The right ventricular size is normal. No increase in right ventricular wall thickness. Right ventricular systolic function is normal.  Left Atrium: Left atrial size was normal in size.  Right Atrium: Right atrial size was normal in size.  Pericardium: There is no evidence of pericardial effusion.  Mitral Valve: Moderate MR by regurgitant volume with restricted posterior leaflet. The mitral valve is degenerative in appearance. Moderate mitral valve regurgitation. No evidence of mitral valve stenosis. MV peak gradient, 7.0 mmHg. The mean mitral valve gradient is 3.0 mmHg.  Tricuspid Valve: The tricuspid valve is normal in structure. Tricuspid valve regurgitation is mild . No  evidence of tricuspid stenosis.  Aortic Valve: The aortic valve is tricuspid. Aortic valve regurgitation is not visualized. No aortic stenosis is present.  Pulmonic Valve: The pulmonic valve was normal in structure. Pulmonic valve regurgitation is not visualized. No evidence of pulmonic stenosis.  Aorta: The aortic root is normal in size and structure.  Venous: The inferior vena cava is normal in size with greater than 50% respiratory variability, suggesting right atrial pressure of 3 mmHg.  IAS/Shunts: No atrial level shunt detected by color flow Doppler.   LEFT VENTRICLE  PLAX 2D LVIDd:         4.40 cm     Diastology LVIDs:         2.90 cm     LV e' medial:    8.27 cm/s LV PW:         0.90 cm     LV E/e' medial:  12.2 LV IVS:        0.90 cm     LV e' lateral:   12.20 cm/s LVOT diam:     2.12 cm     LV E/e' lateral: 8.3 LVOT Area:     3.53 cm 2D Longitudinal Strain 2D Strain GLS Avg:     -13.4 % LV Volumes (MOD) LV vol d, MOD A2C: 76.8 ml LV vol d, MOD A4C: 72.9 ml LV vol s, MOD A2C: 28.8 ml LV vol s, MOD A4C: 32.1 ml LV SV MOD A2C:     48.0 ml LV SV MOD A4C:     72.9 ml LV SV MOD BP:      44.2 ml  RIGHT VENTRICLE RV Basal diam:  3.04 cm RV Mid diam:    3.47 cm RV S prime:     11.40 cm/s TAPSE (M-mode): 1.7 cm  LEFT ATRIUM             Index        RIGHT ATRIUM           Index LA diam:        3.23 cm 1.53 cm/m   RA Area:     15.60 cm LA Vol (A2C):   51.0 ml 24.12 ml/m  RA Volume:   39.30 ml  18.58 ml/m LA Vol (A4C):   47.3 ml 22.37 ml/m LA Biplane Vol: 52.5 ml 24.83 ml/m  AORTA Ao Root diam: 3.19 cm Ao Asc diam:  3.08 cm  MITRAL VALVE                TRICUSPID VALVE MV Area (PHT): 5.54 cm     TR Peak grad:   20.4 mmHg MV Peak grad:  7.0 mmHg     TR Vmax:        226.00 cm/s MV Mean grad:  3.0 mmHg MV Vmax:       1.32 m/s     SHUNTS MV Vmean:      74.4 cm/s    Systemic Diam: 2.12 cm MV Decel Time: 137 msec MV E velocity: 101.00 cm/s MV A velocity: 91.20  cm/s MV E/A ratio:  1.11  Morene Brownie Electronically signed by Morene Brownie Signature Date/Time: 06/04/2024/11:35:49 AM    Final   TEE  ECHO INTRAOPERATIVE TEE 05/30/2024  Narrative *INTRAOPERATIVE TRANSESOPHAGEAL REPORT *    Patient Name:   CARON GORMAN LOUDER Date of Exam: 05/30/2024 Medical Rec #:  [id number]        Height:       73.0 in Accession #:    [id number]       Weight:       206.6 lb Date of Birth:  [birth year]         BSA:          2.18 m Patient Age:    43 years         BP:           111/82 mmHg Patient Gender: M                HR:  85 bpm. Exam Location:  Anesthesiology  Transesophogeal exam was perform intraoperatively during surgical procedure. Patient was closely monitored under general anesthesia during the entirety of examination.  Indications:     I50.9* Heart failure (unspecified) ECMO Decannulation Sonographer:     Damien Senior RDCS Performing Phys: [id number] FRANKY JONETTA BALD Diagnosing Phys: Franky Bald MD  Complications: No known complications during this procedure. POST-OP IMPRESSIONS Overall, there were no significant changes from pre-bypass. TEE placed for monitoring and brief evaluation of cardiac function and position of impella device.  PRE-OP FINDINGS Left Ventricle: The left ventricle Impella present. The cavity size was normal. Left ventrical global hypokinesis, unable to accurately assess for regional wall motion abnormalities. There is no left ventricular hypertrophy. Left ventricle decompressed due to presence of impella 5.5 and ECMO. Impella 4.0 cm (valve to inlet) in the left ventricle.  Right Ventricle: The right ventricle has moderately reduced systolic function. The cavity was dilated. There is no increase in right ventricular wall thickness. Catheter present in the right ventricle. There is no aneurysm seen.  Left Atrium: Left atrial size was normal in size. No left atrial/left atrial appendage thrombus was detected. The  left atrial appendage is well visualized and there is no evidence of thrombus present.  Right Atrium: Right atrial size was normal in size. Catheter present in the right atrium.  Interatrial Septum: No atrial level shunt detected by color flow Doppler. There is no evidence of a patent foramen ovale.  Pericardium: There is no evidence of pericardial effusion. There is no pleural effusion.  Mitral Valve: The mitral valve is normal in structure. Mitral valve regurgitation was not assessed by color flow Doppler. There is no evidence of mitral valve vegetation.  Tricuspid Valve: The tricuspid valve was normal in structure. Tricuspid valve regurgitation was not assessed by color flow Doppler.  Aortic Valve: The aortic valve is tricuspid Aortic valve regurgitation was not assessed by color flow Doppler. There is no evidence of aortic valve vegetation.  Pulmonic Valve: The pulmonic valve was normal in structure. Pulmonic valve regurgitation is not visualized by color flow Doppler.   Shunts: There is no evidence of an atrial septal defect.   Franky Bald MD Electronically signed by Franky Bald MD Signature Date/Time: 05/30/2024/5:00:43 PM    Final        ______________________________________________________________________________________________           Assessment & Plan   Post cardiotomy shock:  - Cannulated 11/5 for central ECMO - IABP removed 11/5 - Impella 5.5 placed 11/6 - Decannulated 11/7 - Extubated 11/9, doing well.  - Impella 5.5 removed 11/11.  - LVEF 50% with Decreased GLS and at LEAST moderate ischemic MR - Volume stable, CVP 5. He does not need Lasix.  - Farxiga 10 mg - MRA 12.5 daily.  - BB-0 with PVCs added succinate 12.5 mg PO Daily  CAD/STEMI: Initial EKG with lateral STEMI, changes and chest pain improved in the ED but given dynamic changes taking to the cath lab. - S/p POBA to the ostial LAD - Poor stent landing zone, multivessel CAD, reduced  EF - s/p CABG - On DAPT - Continue atorvastatin .  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  - Creatinine stable 1.66, this is actually better than his ranges in 2021 and 2023 and pre admission   Elevated LFTs: Trasaminitis has  increased again today, I have held his atorvastatin  40 mg PO daily today    ID: Covering with Zosyn  for possible aspiration. Completed ABX.  For 5 days with clinical resolution of potential sx.  Non further needed  PPX- Lovenox   Discussed at length with Patient, Mother, and AHF team; attempted to reach TCTS - no further cardiac indications for PICC line - needs within two week f/u with clinical cardiology (Dr. Fransisca team)  - needs f/u of LFTs and statin resumption (presumed etiology shock liver)  Agree with team for reasonable DC today   For questions or updates, please contact [facility] Please consult [email or web address] for contact info under Cardiology/STEMI.      Stanly Leavens, MD FASE [facility] Cardiologist [facility]  [address] [place], [address] [place], [place] [address] [phone]  9:03 AM

## 2024-06-05 NOTE — Progress Notes (Signed)
 Inpatient Rehab Admissions Coordinator:   Pt mobilizing well with therapy (>600' with RW and supervision/min assist for ADLs) and desires d/c home when cleared.  I would not be able to get insurance approval with him mobilizing at this level so we will sign off.    Reche Lowers, PT, DPT Admissions Coordinator [phone] 06/05/24 10:00 AM

## 2024-06-06 ENCOUNTER — Telehealth (HOSPITAL_COMMUNITY): Payer: Self-pay

## 2024-06-06 LAB — TYPE AND SCREEN
ABO/RH(D): O POS
Antibody Screen: NEGATIVE
Unit division: 0
Unit division: 0

## 2024-06-06 LAB — BPAM RBC
Blood Product Expiration Date: 202512102359
Blood Product Expiration Date: 202512102359
ISSUE DATE / TIME: 202511111017
ISSUE DATE / TIME: 202511111017
Unit Type and Rh: 5100
Unit Type and Rh: 5100

## 2024-06-06 NOTE — Telephone Encounter (Signed)
 Called to do CRPH1 ed. Ed given to pt and family. Discussed heart healthy diet, sternal precautions, IS use, wound care, and exercise guidelines. Will refer to [facility]II [place]. Pt at home with mother to take care of him.

## 2024-06-06 NOTE — Telephone Encounter (Signed)
 Called patient to see if he was interested in cardiac rehab. LVMTCB

## 2024-06-10 LAB — ECHO INTRAOPERATIVE TEE
AR max vel: 1.65 cm2
AV Area VTI: 1.44 cm2
AV Area mean vel: 1.75 cm2
AV Mean grad: 3 mmHg
AV Peak grad: 5.9 mmHg
Ao pk vel: 1.21 m/s
Height: 73 in
Weight: 3344 [oz_av]

## 2024-06-11 ENCOUNTER — Other Ambulatory Visit: Payer: Self-pay

## 2024-06-11 ENCOUNTER — Other Ambulatory Visit (HOSPITAL_COMMUNITY): Payer: Self-pay

## 2024-06-11 ENCOUNTER — Other Ambulatory Visit: Payer: Self-pay | Admitting: Pharmacist

## 2024-06-11 DIAGNOSIS — B20 Human immunodeficiency virus [HIV] disease: Secondary | ICD-10-CM

## 2024-06-11 MED ORDER — CABOTEGRAVIR & RILPIVIRINE ER 600 & 900 MG/3ML IM SUER
1.0000 | INTRAMUSCULAR | 5 refills | Status: AC
  Filled 2024-06-11: qty 6, 60d supply, fill #0
  Filled 2024-08-18: qty 6, 60d supply, fill #1

## 2024-06-11 NOTE — Progress Notes (Signed)
 Specialty Pharmacy Refill Coordination Note  Thomas [name] is a 43 y.o. male assessed today regarding refills of clinic administered specialty medication(s) Cabotegravir  & Rilpivirine  (CABENUVA )   Clinic requested Courier to Provider Office   Delivery date: 06/18/24   Verified address: [address] [place] [address]   Medication will be filled on 06/17/24.

## 2024-06-16 ENCOUNTER — Ambulatory Visit
Attending: Thoracic Surgery (Cardiothoracic Vascular Surgery) | Admitting: Thoracic Surgery (Cardiothoracic Vascular Surgery)

## 2024-06-16 ENCOUNTER — Other Ambulatory Visit (HOSPITAL_COMMUNITY): Payer: Self-pay

## 2024-06-16 DIAGNOSIS — Z951 Presence of aortocoronary bypass graft: Secondary | ICD-10-CM

## 2024-06-16 NOTE — Progress Notes (Signed)
     [address] [place] [address]             [phone]      Patient: Home Provider: Office Consent for Telemedicine visit obtained.  Today's visit was completed via a real-time telehealth (see specific modality noted below). The patient/authorized person provided oral consent at the time of the visit to engage in a telemedicine encounter with the present provider at [facility]. The patient/authorized person was informed of the potential benefits, limitations, and risks of telemedicine. The patient/authorized person expressed understanding that the laws that protect confidentiality also apply to telemedicine. The patient/authorized person acknowledged understanding that telemedicine does not provide emergency services and that he or she would need to call 911 or proceed to the nearest hospital for help if such a need arose.   Total time spent in the clinical discussion 10 minutes.  Telehealth Modality: Phone visit (audio only)  I had a telephone visit with  Thomas [name] who is s/p CABG, with impella placement.  Overall doing well.  Pain is minimal.  Ambulating well. Vitals have been stable.  Thomas [name] will see us  back in 1 month with a chest x-ray for cardiac rehab clearance.  Thomas [name]

## 2024-06-17 ENCOUNTER — Other Ambulatory Visit: Payer: Self-pay

## 2024-06-17 NOTE — Progress Notes (Unsigned)
 Cardiology Office Note    Date:  06/18/2024  ID:  Thomas [name], DOB [birth year], MRN [id number] PCP:  Pcp, No  Cardiologist:  Thomas JINNY Lawrence, MD  Electrophysiologist:  None   Chief Complaint: f/u CABG  History of Present Illness: .    Thomas [name] is a 43 y.o. male with visit-pertinent history of CAD (cardiac arrest in 2015 s/p PCI to OM1/Cx, NSTEMI with diffuse CAD/coronary vasospasm managed medically, lateral MI 05/2024 with POBA to ostial LAD with subsequent CABG, post-cardiotomy shock), mitral regurgitation, HIV, CKD 3b, prior right nephrectomy due to complications from kidney stone, chronic hydronephrosis, h/o Hodgkin's lymphoma treated with chemotherapy seen for follow-up.   Recently admitted earlier in November 05/2024 with CP And EKG changes concerning for aborted STEMI. Initial cath 05/27/24 with POBA to the ostial LAD, with IABP placement. Underwent CABGx2 05/28/24 with LIMA-D1, RSVG-OM with post cardiotomy shock requiing ECMO, Impella, pressors. He was followed by multidiscplinary team, also treated with course of abx and followed for AKI on CKD. CIR recommended and patient declined. Atorvastatin  was discontinued due to elevated LFTs felt due to shock liver with consideration to revisit as OP. Did not require abx at discharge. Pre-op echo 05/27/24 showed EF 30-35%. Repeat echo during stay 06/04/24 EF 50-55% + distal anterior/anterolateral and apical hypokinesis, moderate MR felt ischemic in etiology.  He returns for follow-up today with his mom. Overall he feels like he is doing pretty good. He has noticed some continued soreness of his chest wall worse with slight position changes, occasional dyspnea when lying back at night, and episodic productive sounding cough. He has not had any recurrent anginal-type chest pain. No pleuritic chest pain, lower extremity edema, fevers, chills, hemoptysis, wound concerns. He has not noticed any dyspnea simply sitting at rest and has felt  better up and walking. Taking all meds as listed below.   Labwork independently reviewed: 05/2024 Hgb 9.8, plt ok, MG 1.9, K 3.6, Cr 1.66, AST ALT 138/188, alb 2.9, Tbili 1.3, trops 2-3k range, LDL 130, trig 144  ROS: .    Please see the history of present illness. O All other systems are reviewed and otherwise negative.  Studies Reviewed: SABRA    EKG:  EKG is ordered today, personally reviewed, demonstrating   EKG Interpretation Date/Time:  Wednesday June 18 2024 11:18:55 EST Ventricular Rate:  103 PR Interval:  154 QRS Duration:  86 QT Interval:  324 QTC Calculation: 424 R Axis:   54  Text Interpretation: Sinus tachycardia Right atrial enlargement Diffuse nonspecific ST/TW changes, suspect evolution from recent MI Confirmed by Thomas [name] [phone]) on 06/18/2024 11:35:32 AM    CV Studies: Cardiac studies reviewed are outlined and summarized above. Otherwise please see EMR for full report.   Current Reported Medications:.    Current Meds  Medication Sig   aspirin  81 MG chewable tablet Chew 1 tablet (81 mg total) by mouth daily.   baclofen (LIORESAL) 10 MG tablet Take 10 mg by mouth as needed for muscle spasms.   cabotegravir  & rilpivirine  ER (CABENUVA ) 600 & 900 MG/3ML injection Inject 1 kit into the muscle every 2 (two) months.   clopidogrel  (PLAVIX ) 75 MG tablet Take 1 tablet (75 mg total) by mouth daily.   dapagliflozin  propanediol (FARXIGA ) 10 MG TABS tablet Take 1 tablet (10 mg total) by mouth daily.   metoprolol  succinate (TOPROL -XL) 25 MG 24 hr tablet Take 0.5 tablets (12.5 mg total) by mouth daily.   spironolactone  (ALDACTONE ) 25 MG tablet Take  0.5 tablets (12.5 mg total) by mouth daily.    Physical Exam:    VS:  BP 115/80   Pulse (!) 103   Ht 6' (1.829 m)   Wt 186 lb (84.4 kg)   SpO2 94%   BMI 25.23 kg/m    Wt Readings from Last 3 Encounters:  06/18/24 186 lb (84.4 kg)  06/05/24 193 lb 11.2 oz (87.9 kg)  04/30/24 209 lb (94.8 kg)    GEN: Well  nourished, well developed in no acute distress NECK: No JVD; No carotid bruits CARDIAC: Mildly tachycardic, regular, no murmurs, rubs, gallops. Sternal wound c/d/i RESPIRATORY:  L basilar rhonchi/coarse BS with no wheezing ABDOMEN: Soft, non-tender, non-distended EXTREMITIES:  No edema; No acute deformity. Right groin cath site without hematoma, ecchymosis, or bruit.   Asessement and Plan:.    1. CAD s/p PCIs, recent MI/CABG with cardiogenic shock/ischemic cardiomyopathy with chronic HFimpEF, mild sinus tachycardia - overall well appearing and feels like he is slowly improving though still notes occasional productive cough and some orthopnea occasionally when he lays back. He also has noted some chest incision soreness but no overt angina. No pleuritic chest pain or s/sx of VTE on exam. Lung exam demonstrates mild rhonchi in the LLL with coarse BS. No wheezing. No edema. Discussed clinical case and EKG with Dr. Elmira, including clinical question of PNA. HR is generally stable to recent hospital/OP values. Dr. Elmira recommends to obtain additional diagnostics before initiation of abx as some of this may represent atelectasis versus mild hypervolemia. Per our discussion, will obtain expedited 2V CXR and labs today to include CBC, proBNP, procalcitonin, and CMET (following up LFTs from recent hospitalization), get non-emergent TSH, and start trial of Lasix  20mg  daily x 3 days. If workup suggestive of pneumonia, would plan to touch base with ID team regarding antibiotic management given recent hospitalization as well as HIV status. If workup reassuring, he would advise the patient practice incentive spirometry and follow symptoms with short course of diuretic as mentioned. ER/return precautions discussed with patient who is agreeable to plan. Otherwise will continue ASA 81mg  daily, Plavix  75mg  daily, Farxiga  10mg  daily, Toprol  12.5mg  daily, and spironolactone  12.5mg  daily.   3. Mitral regurgitation  - follow clinically. Would discuss timing of next follow-up with MD at subsequent visit, possibly 6-12 months.  4. Hyperlipidemia - checking LFTs today to determine whether we can add atorvastatin .  5. CKD 3a - recheck kidney function today.     Cardiac Rehabilitation Eligibility Assessment  The patient is NOT ready to start cardiac rehabilitation due to: Other (Work-up ordered for possible PNA, can re-assess at follow-up)    Disposition: F/u with Dr. Elmira or me/APP in 2-3 weeks, sooner if does not continue to improve.  Signed, Estelita Iten N Rasheeda Mulvehill, PA-C

## 2024-06-18 ENCOUNTER — Ambulatory Visit: Payer: Self-pay | Admitting: Physician Assistant

## 2024-06-18 ENCOUNTER — Telehealth: Payer: Self-pay

## 2024-06-18 ENCOUNTER — Encounter: Payer: Self-pay | Admitting: Physician Assistant

## 2024-06-18 ENCOUNTER — Ambulatory Visit: Attending: Physician Assistant | Admitting: Physician Assistant

## 2024-06-18 ENCOUNTER — Ambulatory Visit (HOSPITAL_COMMUNITY)
Admission: RE | Admit: 2024-06-18 | Discharge: 2024-06-18 | Disposition: A | Discharge status: Home / Self Care | Source: Ambulatory Visit | Attending: Physician Assistant | Admitting: Physician Assistant

## 2024-06-18 VITALS — BP 115/80 | HR 103 | Temp 98.7°F | Ht 72.0 in | Wt 186.0 lb

## 2024-06-18 DIAGNOSIS — R0602 Shortness of breath: Secondary | ICD-10-CM

## 2024-06-18 DIAGNOSIS — R570 Cardiogenic shock: Secondary | ICD-10-CM | POA: Diagnosis not present

## 2024-06-18 DIAGNOSIS — I5020 Unspecified systolic (congestive) heart failure: Secondary | ICD-10-CM

## 2024-06-18 DIAGNOSIS — N1831 Chronic kidney disease, stage 3a: Secondary | ICD-10-CM

## 2024-06-18 DIAGNOSIS — I2510 Atherosclerotic heart disease of native coronary artery without angina pectoris: Secondary | ICD-10-CM

## 2024-06-18 DIAGNOSIS — I340 Nonrheumatic mitral (valve) insufficiency: Secondary | ICD-10-CM | POA: Diagnosis not present

## 2024-06-18 DIAGNOSIS — E785 Hyperlipidemia, unspecified: Secondary | ICD-10-CM

## 2024-06-18 LAB — COMPREHENSIVE METABOLIC PANEL WITH GFR
ALT: 21 IU/L (ref 0–50)
AST: 15 IU/L (ref 15–59)
Albumin: 4.5 g/dL (ref 4.1–5.1)
Alkaline Phosphatase: 90 IU/L (ref 47–123)
BUN/Creatinine Ratio: 6 — ABNORMAL LOW (ref 9–20)
BUN: 11 mg/dL (ref 6–24)
Bilirubin Total: 0.7 mg/dL (ref 0.0–1.2)
CO2: 24 mmol/L (ref 20–29)
Calcium: 10.1 mg/dL (ref 8.7–10.2)
Chloride: 103 mmol/L (ref 96–106)
Creatinine, Ser: 1.72 mg/dL — ABNORMAL HIGH (ref 0.76–1.27)
Globulin, Total: 3.2 g/dL (ref 1.5–4.5)
Glucose: 95 mg/dL (ref 70–99)
Potassium: 3.8 mmol/L (ref 3.5–5.2)
Sodium: 140 mmol/L (ref 134–144)
Total Protein: 7.7 g/dL (ref 6.0–8.5)
eGFR: 51 mL/min/1.73 — ABNORMAL LOW (ref >59)

## 2024-06-18 LAB — CBC
Hematocrit: 36.8 % — ABNORMAL LOW (ref 37.5–51.0)
Hemoglobin: 12.1 g/dL — ABNORMAL LOW (ref 13.0–17.7)
MCH: 30.9 pg (ref 26.6–33.0)
MCHC: 32.9 g/dL (ref 31.5–35.7)
MCV: 94 fL (ref 79–97)
Platelets: 416 x10E3/uL (ref 150–450)
RBC: 3.91 x10E6/uL — ABNORMAL LOW (ref 4.14–5.80)
RDW: 15.1 % (ref 11.6–15.4)
WBC: 5.7 x10E3/uL (ref 3.4–10.8)

## 2024-06-18 MED ORDER — FUROSEMIDE 20 MG PO TABS: 20.0000 mg | ORAL_TABLET | Freq: Every day | ORAL | 0 refills | Status: DC

## 2024-06-18 NOTE — Patient Instructions (Signed)
  Medication Instructions:   TAKE furosemide  (lasix ) 20mg  daily for 3 days. Best to take before 2pm  *If you need a refill on your cardiac medications before your next appointment, please call your pharmacy*  Lab Work:  Go to lab on first floor today  If you have labs (blood work) drawn today and your tests are completely normal, you will receive your results only by: MyChart Message (if you have MyChart) OR A paper copy in the mail If you have any lab test that is abnormal or we need to change your treatment, we will call you to review the results.  Testing/Procedures:  Chest X-Ray today  Follow-Up: At [facility], you and your health needs are our priority.  As part of our continuing mission to provide you with exceptional heart care, our providers are all part of one team.  This team includes your primary Cardiologist (physician) and Advanced Practice Providers or APPs (Physician Assistants and Nurse Practitioners) who all work together to provide you with the care you need, when you need it.  Your next appointment:    2-3 weeks with [facility] PA or another PA/NP or Dr. Elmira  We recommend signing up for the patient portal called MyChart.  Sign up information is provided on this After Visit Summary.  MyChart is used to connect with patients for Virtual Visits (Telemedicine).  Patients are able to view lab/test results, encounter notes, upcoming appointments, etc.  Non-urgent messages can be sent to your provider as well.   To learn more about what you can do with MyChart, go to [email or web address].   Other Instructions

## 2024-06-18 NOTE — Telephone Encounter (Signed)
 [facility] Patient Advocate Encounter  Patient's medications Cabenuva  have been couriered to [facility] from [facility] and will be administered at the patients appointment on 06/24/24.  Arland Hutchinson, CPhT Specialty Pharmacy Patient [facility] for Infectious Disease Phone: [phone] Fax:  [phone]

## 2024-06-20 LAB — COMPREHENSIVE METABOLIC PANEL WITH GFR
ALT: 18 IU/L (ref 0–44)
AST: 13 IU/L (ref 0–40)
Albumin: 4.4 g/dL (ref 4.1–5.1)
Alkaline Phosphatase: 82 IU/L (ref 47–123)
BUN/Creatinine Ratio: 7 — ABNORMAL LOW (ref 9–20)
BUN: 11 mg/dL (ref 6–24)
Bilirubin Total: 0.6 mg/dL (ref 0.0–1.2)
CO2: 20 mmol/L (ref 20–29)
Calcium: 10.2 mg/dL (ref 8.7–10.2)
Chloride: 102 mmol/L (ref 96–106)
Creatinine, Ser: 1.6 mg/dL — ABNORMAL HIGH (ref 0.76–1.27)
Globulin, Total: 3.2 g/dL (ref 1.5–4.5)
Glucose: 96 mg/dL (ref 70–99)
Potassium: 3.9 mmol/L (ref 3.5–5.2)
Sodium: 139 mmol/L (ref 134–144)
Total Protein: 7.6 g/dL (ref 6.0–8.5)
eGFR: 55 mL/min/1.73 — ABNORMAL LOW (ref >59)

## 2024-06-20 LAB — PROCALCITONIN

## 2024-06-20 LAB — TSH: TSH: 3.2 u[IU]/mL (ref 0.450–4.500)

## 2024-06-20 LAB — PRO B NATRIURETIC PEPTIDE: NT-Pro BNP: 1063 pg/mL — ABNORMAL HIGH (ref 0–86)

## 2024-06-23 ENCOUNTER — Telehealth: Payer: Self-pay | Admitting: Physician Assistant

## 2024-06-23 ENCOUNTER — Other Ambulatory Visit (HOSPITAL_COMMUNITY)

## 2024-06-23 DIAGNOSIS — Z5982 Transportation insecurity: Secondary | ICD-10-CM

## 2024-06-23 DIAGNOSIS — Z748 Other problems related to care provider dependency: Secondary | ICD-10-CM

## 2024-06-23 DIAGNOSIS — R059 Cough, unspecified: Secondary | ICD-10-CM

## 2024-06-23 MED ORDER — ATORVASTATIN CALCIUM 40 MG PO TABS: 40.0000 mg | ORAL_TABLET | Freq: Every day | ORAL | 3 refills | Status: DC

## 2024-06-23 NOTE — Telephone Encounter (Signed)
 I called patient to check on him since procalcitonin was cancelled by lab. I also sent msg to [facility] to see if the duplicate CMET can get no-charged, unclear why this ran twice. pBNP was elevated suggestive of fluid retention. CXR and WBC argue against infection. Per my discussion with Dr. Elmira, plan was to treat with short course of Lasix  20mg  daily which I discussed with patient on Wednesday 11/26 and sent into [facility] per the patient's request. He has not yet started this as he was expecting it to be delivered. He will call them today to discuss - I advised he pick up ASAP. He still feels OK, just still has a mild clear phlegm cough at times, still no fevers or chills. No progression in dyspnea from OV last week, overall about the same. We also discussed starting atorvastatin  40mg  daily since abnormal LFTs resolved - sent in to [facility] per his request. I advised he return to the lab to get procalcitonin drawn today - he will try but relies on transportation. He will call us  if symptoms persist beyond the 3 days of Lasix . He needs close follow-up to ensure fluid status stable. I do not see his follow-up got scheduled - would recommend f/u CMET at next OV to ensure labs stable with rx's above.  Triage: can you please help schedule follow-up with Dr. Elmira or APP within 1 week to reassess symptoms we are doing a short course of Lasix ? OK for TOC slot, add needs CMET to OV notes (order can be entered by provider at visit in case any additional labs needed).  Marit (social work): this patient reports transportation as a frequent barrier to care, anything we can do to help out?   Thank you!

## 2024-06-23 NOTE — Progress Notes (Unsigned)
 HPI: Thomas [name] is a 43 y.o. male who presents to the [facility] pharmacy clinic for Cabenuva  administration.  Referring ID Provider: Dr. Overton  Patient Active Problem List   Diagnosis Date Noted   Aspiration pneumonia of both lower lobes (HCC) 06/04/2024   Palliative care by specialist 06/04/2024   Malnutrition of moderate degree 06/02/2024   Acute respiratory failure with hypoxia (HCC) 05/30/2024   Mucus plug in respiratory tract 05/29/2024   Cardiogenic shock (HCC) 05/28/2024   Patient receiving ECMO 05/28/2024   S/P CABG x 2 05/28/2024   On mechanically assisted ventilation (HCC) 05/28/2024   ABLA (acute blood loss anemia) 05/28/2024   Thrombocytopenia 05/28/2024   STEMI involving left anterior descending coronary artery (HCC) 05/27/2024   Need for prophylactic vaccination and inoculation against influenza 05/09/2021   Encounter for long-term (current) use of high-risk medication 08/16/2020   Medication monitoring encounter 03/06/2017   Screening examination for venereal disease 02/24/2016   Acute coronary syndrome (HCC) 10/27/2015   Unstable angina (HCC) 10/27/2015   Obstructive uropathy 12/12/2014   Nephrolithiasis 12/12/2014   Hyperlipidemia 12/10/2014   CKD (chronic kidney disease) s/p Right nephrectomy (04/30/2014) 05/19/2014   Elevated transaminase level 05/19/2014   Renal neoplasm 04/30/2014   Hypokalemia 01/22/2014   Hodgkin's disease (HCC) 08/26/2013   Iron deficiency anemia, unspecified 08/04/2013   Tachycardia 08/06/2012   Penile cyst 08/06/2012   HIV disease (HCC) 06/25/2012    Patient's Medications  New Prescriptions   No medications on file  Previous Medications   ASPIRIN  81 MG CHEWABLE TABLET    Chew 1 tablet (81 mg total) by mouth daily.   ATORVASTATIN  (LIPITOR ) 40 MG TABLET    Take 1 tablet (40 mg total) by mouth daily.   BACLOFEN (LIORESAL) 10 MG TABLET    Take 10 mg by mouth as needed for muscle spasms.   CABOTEGRAVIR  & RILPIVIRINE  ER (CABENUVA ) 600  & 900 MG/3ML INJECTION    Inject 1 kit into the muscle every 2 (two) months.   CLOPIDOGREL  (PLAVIX ) 75 MG TABLET    Take 1 tablet (75 mg total) by mouth daily.   DAPAGLIFLOZIN  PROPANEDIOL (FARXIGA ) 10 MG TABS TABLET    Take 1 tablet (10 mg total) by mouth daily.   FUROSEMIDE  (LASIX ) 20 MG TABLET    Take 1 tablet (20 mg total) by mouth daily. Take for 3 days.   METOPROLOL  SUCCINATE (TOPROL -XL) 25 MG 24 HR TABLET    Take 0.5 tablets (12.5 mg total) by mouth daily.   SPIRONOLACTONE  (ALDACTONE ) 25 MG TABLET    Take 0.5 tablets (12.5 mg total) by mouth daily.  Modified Medications   No medications on file  Discontinued Medications   No medications on file    Allergies: Allergies  Allergen Reactions   Tylenol  [Acetaminophen ] Other (See Comments)    Cold sweats    Labs: Lab Results  Component Value Date   HIV1RNAQUANT NOT DETECTED 02/26/2024   HIV1RNAQUANT Not Detected 09/03/2023   HIV1RNAQUANT Not Detected 06/25/2023   CD4TABS 686 02/26/2024   CD4TABS 929 05/17/2023   CD4TABS 942 09/21/2022    RPR and STI Lab Results  Component Value Date   LABRPR REACTIVE (A) 09/03/2023   LABRPR REACTIVE (A) 05/17/2023   LABRPR REACTIVE (A) 09/21/2022   LABRPR NON-REACTIVE 08/16/2020   LABRPR NON-REACTIVE 02/17/2019   RPRTITER 1:16 (H) 09/03/2023   RPRTITER 1:16 (H) 05/17/2023   RPRTITER 1:32 (H) 09/21/2022    STI Results GC GC CT CT  Latest Ref Rng &  Units  NEGATIVE  NEGATIVE  05/17/2023  9:52 AM Negative    Negative   Negative    Negative    09/21/2022  9:37 AM Negative   Negative    08/16/2020 10:14 AM Negative   Negative    09/06/2017 12:00 AM Negative   Negative    02/16/2016 12:00 AM Negative   Negative    12/30/2015 12:00 AM Negative   Negative    02/23/2014  9:46 PM  NEGATIVE   NEGATIVE   08/03/2013  2:04 PM  NEGATIVE   NEGATIVE     Hepatitis B Lab Results  Component Value Date   HEPBSAB POS (A) 06/06/2012   HEPBSAG NEGATIVE 05/19/2014   HEPBCAB NEG 06/06/2012    Hepatitis C No results found for: HEPCAB, HCVRNAPCRQN Hepatitis A Lab Results  Component Value Date   HAV NEG 06/06/2012   Lipids: Lab Results  Component Value Date   CHOL 195 05/27/2024   TRIG 199 (H) 06/04/2024   HDL 46 05/27/2024   CHOLHDL 4.2 05/27/2024   VLDL 23 05/27/2024   LDLCALC 126 (H) 05/27/2024    Target Date: The 6th  Assessment: Thomas [name] presents today for his maintenance Cabenuva  injections. Past injections were tolerated well without issues. Last HIV RNA was not detected in August.  Since last visit, he had a heart attack that resulted in a CABG. He spent 2.5 weeks in the hospital. He is doing better now. He had to start 4-5 new medications post surgery and states that he takes those every day without issues or side effects.   Lab work:  None today  Eligible vaccinations:  Due for second and final Hepatitis A vaccine and accpets this today  Cabenuva : Administered cabotegravir  600mg /25mL in left upper outer quadrant of the gluteal muscle. Administered rilpivirine  900 mg/3mL in the right upper outer quadrant of the gluteal muscle. No issues with injections. He will follow up in 2 months for next set of injections.  Plan: - Cabenuva  injections administered - Hepatitis A vaccine #2 of 2 today - check for immunity at next lab draw - Next injections scheduled for 08/26/24 with me - Call with any issues or questions  Thomas Bartolomei L. Allexa Acoff, PharmD, BCIDP, AAHIVP, CPP Clinical Pharmacist Practitioner - Infectious Diseases Clinical Pharmacist Lead - Specialty Pharmacy [facility] for Infectious Disease

## 2024-06-23 NOTE — Telephone Encounter (Signed)
 Spoke with patient and appointment has been scheduled with APP on 06/26/24. Spoke with Dayna Dunn to advise. Pt mentioned while on call that he is have some chest pain. Pt states that the chest pain is noticeable specifically with cough and believes that it is due to chest congestion. After discussing with Dayna, pt was advised to go to emergency room for further evaluation due to recent inability to complete recent blood work and difficulty to manage as outpatient. Pt agrees with plan, but due to transportation challenges he is unable to go to the hospital today. Pt advised if symptoms worsen to call 911. Pt agrees and will also plan to go to the emergency room tomorrow for further evaluation.

## 2024-06-23 NOTE — Telephone Encounter (Signed)
 REF Entered. Triage: see remaining bolded question below, thanks!

## 2024-06-24 ENCOUNTER — Ambulatory Visit: Admitting: Pharmacist

## 2024-06-24 ENCOUNTER — Other Ambulatory Visit: Payer: Self-pay

## 2024-06-24 ENCOUNTER — Telehealth: Payer: Self-pay | Admitting: Licensed Clinical Social Worker

## 2024-06-24 DIAGNOSIS — B20 Human immunodeficiency virus [HIV] disease: Secondary | ICD-10-CM

## 2024-06-24 DIAGNOSIS — Z23 Encounter for immunization: Secondary | ICD-10-CM

## 2024-06-24 MED ORDER — CABOTEGRAVIR & RILPIVIRINE ER 600 & 900 MG/3ML IM SUER
1.0000 | Freq: Once | INTRAMUSCULAR | Status: AC
  Administered 2024-06-24: 1 via INTRAMUSCULAR

## 2024-06-24 NOTE — Telephone Encounter (Signed)
 H&V Care Navigation CSW Progress Note  Clinical Social Worker contacted patient by phone to f/u on transportation concerns. Was able to reach pt today at [phone] however he was currently at another appt. We agreed to try and connect later today.  Patient is participating in a Managed Medicaid Plan:  No, UHC Dual Complete  SDOH Screenings   Food Insecurity: Low Risk  (06/09/2024)   Received from [facility]  Housing: Low Risk  (06/09/2024)   Received from [facility]  Transportation Needs: No Transportation Needs (06/09/2024)   Received from [facility]  Utilities: Low Risk  (06/09/2024)   Received from [facility]  Depression (PHQ2-9): Low Risk  (04/30/2024)  Social Connections: Unknown (12/06/2021)   Received from [facility]  Tobacco Use: Medium Risk (06/18/2024)    Marit Lark, MSW, LCSW Clinical Social Worker II [facility] Heart/Vascular Care Navigation  [phone]- work cell phone (preferred)

## 2024-06-24 NOTE — Progress Notes (Deleted)
 Cardiology Office Note   Date:  06/24/2024  ID:  Thomas [name], DOB April 01, 1981, MRN [id number] PCP: Pcp, No  [facility] Providers Cardiologist:  [name], MD    History of Present Illness Thomas [name] is a 43 y.o. male with a past medical history of CAD with cardiac arrest in 2015, mitral regurgitation, HIV, CKD stage IIIb, prior right nephrectomy due to complications from kidney stone, chronic hydronephrosis, history of Hodgkin's lymphoma treated with chemotherapy.  Patient followed by Dr. Lawrence, presents for a follow-up appointment  Patient previously had cardiac arrest in 2015.  Underwent PCI to OM1/circumflex.  Later, left heart catheterization 10/2015 showed severe diffuse coronary artery disease.  Previously placed stent in OM branch was patent.  Catheter induced spasm evident, relieved with nitroglycerin .  Echocardiogram in 05/2027 showed EF 50-55%, normal wall motion, mild-moderate MR, mild TR.  Patient was admitted in 05/2024 with chest pain.  EKG changes concerning for aborted STEMI.  Underwent cath on 05/27/24 with Tolba to ostial LAD and IABP placement.  Ultimately underwent CABG x 2 on 05/28/2024 with LIMA-D1, SVG-OM.  Had post cardiotomy shock requiring ECMO, Impella, pressors.  Followed by multidisciplinary team, also treated with course of antibiotics and followed for AKI.  It was recommended that patient be discharged to CIR but patient declined.  Preop echo 05/27/24 showed EF 30-35%.  Repeat echocardiogram 06/04/2024 showed EF 50-55% with wall motion abnormalities, mild MR that was felt to be ischemic in etiology.  Patient was seen in clinic for follow-up on 06/18/2024.  At that time, patient had some continued soreness to his chest wall that was worse with position changes.  Also had occasional dyspnea when laying back at night and episodic cough.  Had not had any recurrent anginal type chest pain.  No pleuritic chest pain, lower extremity edema, fever or  chills.  Lung exam demonstrated mild rhonchi in LLL with coarse breath sounds.  No edema.  Recommended chest x-ray, CBC, proBNP, procalcitonin, c-Met.  Chest x-ray showed no acute findings.  proBNP was elevated to 1063.  Started on Lasix .  CAD s/p CABG  - Patient underwent CABG x 2 with LIMA-D1, SVG-OM on 05/28/2024.  After surgery had shock requiring ECMO, Impella, pressors -  - Continue aspirin  81 mg daily, Plavix  75 mg daily.  Patient is on DAPT because he presented with STEMI prior to CABG - Continue Lipitor  40 mg daily - Continue metoprolol  succinate 12.5 mg daily  Cough  Chronic heart failure with improved ejection fraction - At last visit on 11/26, patient reported occasional cough, orthopnea.  Chest x-ray showed no acute findings.  WBC normal at 5.7, not consistent with infection.  proBNP elevated to 1063.  Started on Lasix  20 mg daily for 3 days  - Echocardiogram from 06/04/2024 showed EF 50-55%, moderate MR.  EF had been 30-35% prior to CABG. - Ordered repeat pro-BNP, CMP today  - Continue Farxiga  10 mg daily, metoprolol  succinate 12.5 mg daily, spironolactone  12.5 mg daily  MR  - Echocardiogram on 06/04/24 post CABG showed moderate MR, felt to be ischemic - Ordered repeat echocardiogram to be completed in 4 months  HLD  - Lipid panel from 05/2024 showed LDL 126, HDL 46, triglycerides 113, total cholesterol 195.  Lipoprotein a 253 - During recent admission, statin was held due to elevated LFTs and patient in shock.  LFTs had normalized on 11/26 and patient was started on Lipitor  40 mg daily - Repeating CMP as above  CKD stage  IIIa  - Baseline creatinine appears to be around 1.6-1.7 - Creatinine 1.60 on 11/26.  Ordered repeat labs today  ROS: ***  Studies Reviewed      *** Risk Assessment/Calculations {Does this patient have ATRIAL FIBRILLATION?:[phone]} No BP recorded.  {Refresh Note OR Click here to enter BP  :1}***       Physical Exam VS:  There were no vitals  taken for this visit.       Wt Readings from Last 3 Encounters:  06/18/24 186 lb (84.4 kg)  06/05/24 193 lb 11.2 oz (87.9 kg)  04/30/24 209 lb (94.8 kg)    GEN: Well nourished, well developed in no acute distress NECK: No JVD; No carotid bruits CARDIAC: ***RRR, no murmurs, rubs, gallops RESPIRATORY:  Clear to auscultation without rales, wheezing or rhonchi  ABDOMEN: Soft, non-tender, non-distended EXTREMITIES:  No edema; No deformity   ASSESSMENT AND PLAN *** {The patient has an active order for outpatient cardiac rehabilitation.   Please indicate if the patient is ready to start. Do NOT delete this.  It will auto delete.  Refresh note, then sign.              Click here to document readiness and see contraindications.  :1}  Cardiac Rehabilitation Eligibility Assessment  The patient is NOT ready to start cardiac rehabilitation due to: Other (Work-up ordered for possible PNA, can re-assess at follow-up)    {Are you ordering a CV Procedure (e.g. stress test, cath, DCCV, TEE, etc)?   Press F2        :[id number]}  Dispo: ***  Signed, Rollo FABIENE Louder, PA-C

## 2024-06-24 NOTE — Progress Notes (Signed)
 Heart and Vascular Care Navigation  06/24/2024  Thomas [name] 1981-06-24 [id number]  Reason for Referral: transportation resources   Engaged with patient by telephone for initial visit for Heart and Vascular Care Coordination.                                                                                                   Assessment:          LCSW was able to reach pt again this afternoon at [phone]. Introduced self, role, reason for call. Confirmed home address, PCP, full name and DOB. Pt resides with family (mother). He shares that usually he uses ride benefits through his [facility] Medicare Dual Complete or borrows a vehicle from family friends to get to appts. They are usually able to get to and from places but just have to know ahead of time. Pt aware of recommendations from RN yesterday, encouraged him to call 911 if he does continue to have concerns related to what was shared with team yesterday as that is the quickest way to get care.  Otherwise pt shares no issues with access to food and medications- states he picked up those meds prescribed by Dayna today after ID appt. He also shares no issues with rent/utilities.   Encouraged him to reach out again as needed.                          HRT/VAS Care Coordination     Patients Home Cardiology Office [facility]   Outpatient Care Team Social Worker   Social Worker Name: Marit Lark, [place], [phone]   Living arrangements for the past 2 months Apartment   Lives with: Parents   Patient Current Insurance Coverage Managed Medicare; Medicaid   Patient Has Concern With Paying Medical Bills No   Does Patient Have Prescription Coverage? Yes   Home Assistive Devices/Equipment None       Social History:                                                                             SDOH Screenings   Food Insecurity: No Food Insecurity (06/24/2024)  Housing: Low Risk  (06/24/2024)  Transportation Needs: Unmet Transportation  Needs (06/24/2024)  Utilities: Not At Risk (06/24/2024)  Depression (PHQ2-9): Low Risk  (04/30/2024)  Financial Resource Strain: Low Risk  (06/24/2024)  Social Connections: Unknown (12/06/2021)   Received from [facility]  Tobacco Use: Medium Risk (06/18/2024)  Health Literacy: Adequate Health Literacy (06/24/2024)    SDOH Interventions: Financial Resources:  Financial Strain Interventions: Intervention Not Indicated  Food Insecurity:  Food Insecurity Interventions: Intervention Not Indicated  Housing Insecurity:  Housing Interventions: Intervention Not Indicated  Transportation:   Transportation Interventions: Patient Resources (Friends/Family), Payor Benefit, FORENSIC PSYCHOLOGIST ([employer]); [employer] by  [employer]     Other Care Navigation Interventions:     Provided Pharmacy assistance resources  Pt denies any issues obtaining or affording medications at this time  Patient expressed Mental Health concerns No.  Patient Referred to: [employer] by [employer]   Follow-up plan:   LCSW mailed pt the following: my card, [employer] and transportation resources. Encouraged pt to follow nursing guidelines to go to ED as needed regarding chest pain/call 911 as needed. No additional questions at this time, encouraged him to call me as needed moving forward.

## 2024-06-26 ENCOUNTER — Ambulatory Visit: Admitting: Cardiology

## 2024-06-27 ENCOUNTER — Ambulatory Visit: Admitting: Physician Assistant

## 2024-06-30 NOTE — Telephone Encounter (Signed)
 Patient never came to the ER and also cancelled office appointment last week. Can you call him to check on him and reinforce need for close f/u? Did not get r/s until February.

## 2024-06-30 NOTE — Telephone Encounter (Signed)
 Spoke with patient, he denies any chest pain since he spoke with nurse last week. He states he has been doing OK.  Offered next available appt with APP on 08/15/24, patient accepted appt with Kenzie Campbell, NP at 11:45 AM.

## 2024-07-07 ENCOUNTER — Telehealth: Payer: Self-pay | Admitting: Student

## 2024-07-07 ENCOUNTER — Telehealth: Payer: Self-pay | Admitting: Physician Assistant

## 2024-07-07 DIAGNOSIS — I5020 Unspecified systolic (congestive) heart failure: Secondary | ICD-10-CM

## 2024-07-07 DIAGNOSIS — Z951 Presence of aortocoronary bypass graft: Secondary | ICD-10-CM

## 2024-07-07 MED ORDER — CLOPIDOGREL BISULFATE 75 MG PO TABS: 75.0000 mg | ORAL_TABLET | Freq: Every day | ORAL | 11 refills | Status: DC

## 2024-07-07 MED ORDER — DAPAGLIFLOZIN PROPANEDIOL 10 MG PO TABS: 10.0000 mg | ORAL_TABLET | Freq: Every day | ORAL | 2 refills | Status: DC

## 2024-07-07 MED ORDER — SPIRONOLACTONE 25 MG PO TABS: 12.5000 mg | ORAL_TABLET | Freq: Every day | ORAL | 3 refills | Status: DC

## 2024-07-07 MED ORDER — METOPROLOL SUCCINATE ER 25 MG PO TB24: 12.5000 mg | ORAL_TABLET | Freq: Every day | ORAL | 2 refills | Status: DC

## 2024-07-07 NOTE — Telephone Encounter (Signed)
 Patient called the after-hours answering service requesting refills for his Aldactone , metoprolol , Farxiga , and Plavix .  Verified that these medications were on the assessment and plan when he was last seen by Lonell on 05/2024.  He requested that these be sent to [facility] pharmacy.  Will do this.  Signed,  Morse Clause, PA-C 07/07/2024, 5:56 PM

## 2024-07-07 NOTE — Telephone Encounter (Signed)
*  STAT* If patient is at the pharmacy, call can be transferred to refill team.     1. Which medications need to be refilled? (please list name of each medication and dose if known) dapagliflozin  propanediol (FARXIGA ) 10 MG TABS tablet  clopidogrel  (PLAVIX ) 75 MG tablet  spironolactone  (ALDACTONE ) 25 MG tablet    metoprolol  succinate (TOPROL -XL) 25 MG 24 hr tablet       2. Would you like to learn more about the convenience, safety, & potential cost savings by using the [facility]? No       3. Are you open to using the [facility] ([facility]     4. Which pharmacy/location (including street and city if local pharmacy) is medication to be sent to? [facility] - [place], [place] - [address]       5. Do they need a 30 day or 90 day supply?  90 day

## 2024-07-08 ENCOUNTER — Other Ambulatory Visit (HOSPITAL_COMMUNITY): Payer: Self-pay

## 2024-07-08 ENCOUNTER — Telehealth: Payer: Self-pay | Admitting: Internal Medicine

## 2024-07-08 ENCOUNTER — Telehealth: Payer: Self-pay | Admitting: Cardiology

## 2024-07-08 NOTE — Telephone Encounter (Signed)
 Patient's mother called overnight regarding about her son's prescriptions for his cardiac medications. Patient's mother stated he's been out of medications and  picked up a 1-2 day prescription of medications today because he could not pick up his long term prescription. She has been trying to get his medications refilled. Patient asked me to reach out to the primary cardiac team regarding his medications urgently. She was very upset that her son ran out of medications and is concerned that his heart conditions will worsen. She is expecting a call from her Cardiology team, otherwise, patient would like to speak to the director of Cardiology at [facility] and take further legal action if necessary. I told the patient that I would send a message to the team regarding our conversation. She was very adult nurse.     Henderson Frampton, MD

## 2024-07-08 NOTE — Telephone Encounter (Signed)
°*  STAT* If patient is at the pharmacy, call can be transferred to refill team.   1. Which medications need to be refilled? (please list name of each medication and dose if known)   clopidogrel  (PLAVIX ) 75 MG tablet  dapagliflozin  propanediol (FARXIGA ) 10 MG TABS tablet  metoprolol  succinate (TOPROL -XL) 25 MG 24 hr tablet  spironolactone  (ALDACTONE ) 25 MG tablet   2. Would you like to learn more about the convenience, safety, & potential cost savings by using the [facility]?   3. Are you open to using the [facility] ([facility]. ).  4. Which pharmacy/location (including street and city if local pharmacy) is medication to be sent to?  [place] [facility]   5. Do they need a 30 day or 90 day supply?   Patient stated he is completely out of these medications and wants to change his pharmacy to [facility].

## 2024-07-08 NOTE — Telephone Encounter (Signed)
 Pt contacted the answering service last night and the refills were sent in.

## 2024-07-09 ENCOUNTER — Other Ambulatory Visit: Payer: Self-pay | Admitting: Physician Assistant

## 2024-07-09 ENCOUNTER — Other Ambulatory Visit (HOSPITAL_COMMUNITY): Payer: Self-pay

## 2024-07-09 DIAGNOSIS — I5020 Unspecified systolic (congestive) heart failure: Secondary | ICD-10-CM

## 2024-07-09 DIAGNOSIS — Z951 Presence of aortocoronary bypass graft: Secondary | ICD-10-CM

## 2024-07-09 MED ORDER — ATORVASTATIN CALCIUM 40 MG PO TABS: 40.0000 mg | ORAL_TABLET | Freq: Every day | ORAL | 0 refills | Status: DC

## 2024-07-09 MED ORDER — DAPAGLIFLOZIN PROPANEDIOL 10 MG PO TABS: 10.0000 mg | ORAL_TABLET | Freq: Every day | ORAL | 3 refills | Status: DC

## 2024-07-09 MED ORDER — SPIRONOLACTONE 25 MG PO TABS
12.5000 mg | ORAL_TABLET | Freq: Every day | ORAL | 3 refills | Status: AC
  Filled 2024-07-09: qty 45, 90d supply, fill #0

## 2024-07-09 MED ORDER — SPIRONOLACTONE 25 MG PO TABS: 12.5000 mg | ORAL_TABLET | Freq: Every day | ORAL | 0 refills | Status: DC

## 2024-07-09 MED ORDER — CLOPIDOGREL BISULFATE 75 MG PO TABS: 75.0000 mg | ORAL_TABLET | Freq: Every day | ORAL | 0 refills | Status: DC

## 2024-07-09 MED ORDER — CLOPIDOGREL BISULFATE 75 MG PO TABS
75.0000 mg | ORAL_TABLET | Freq: Every day | ORAL | 3 refills | Status: AC
  Filled 2024-07-09: qty 90, 90d supply, fill #0

## 2024-07-09 MED ORDER — METOPROLOL SUCCINATE ER 25 MG PO TB24: 12.5000 mg | ORAL_TABLET | Freq: Every day | ORAL | 0 refills | Status: DC

## 2024-07-09 MED ORDER — METOPROLOL SUCCINATE ER 25 MG PO TB24
12.5000 mg | ORAL_TABLET | Freq: Every day | ORAL | 3 refills | Status: AC
  Filled 2024-07-09: qty 45, 90d supply, fill #0

## 2024-07-09 MED ORDER — FUROSEMIDE 20 MG PO TABS
20.0000 mg | ORAL_TABLET | Freq: Every day | ORAL | 0 refills | Status: DC
  Filled 2024-07-09: qty 3, 3d supply, fill #0

## 2024-07-09 MED ORDER — ATORVASTATIN CALCIUM 40 MG PO TABS
40.0000 mg | ORAL_TABLET | Freq: Every day | ORAL | 3 refills | Status: AC
  Filled 2024-07-09: qty 90, 90d supply, fill #0

## 2024-07-09 MED ORDER — DAPAGLIFLOZIN PROPANEDIOL 10 MG PO TABS
10.0000 mg | ORAL_TABLET | Freq: Every day | ORAL | 3 refills | Status: AC
  Filled 2024-07-09: qty 90, 90d supply, fill #0

## 2024-07-09 NOTE — Telephone Encounter (Signed)
 ICD-10-CM   1. Heart failure with improved ejection fraction (HFimpEF) (HCC)  I50.20 spironolactone  (ALDACTONE ) 25 MG tablet    metoprolol  succinate (TOPROL -XL) 25 MG 24 hr tablet    dapagliflozin  propanediol (FARXIGA ) 10 MG TABS tablet    2. S/P CABG x 2  Z95.1 clopidogrel  (PLAVIX ) 75 MG tablet    atorvastatin  (LIPITOR ) 40 MG tablet     Meds ordered this encounter  Medications   spironolactone  (ALDACTONE ) 25 MG tablet    Sig: Take 0.5 tablets (12.5 mg total) by mouth daily.    Dispense:  90 tablet    Refill:  0   metoprolol  succinate (TOPROL -XL) 25 MG 24 hr tablet    Sig: Take 0.5 tablets (12.5 mg total) by mouth daily.    Dispense:  90 tablet    Refill:  0   dapagliflozin  propanediol (FARXIGA ) 10 MG TABS tablet    Sig: Take 1 tablet (10 mg total) by mouth daily.    Dispense:  30 tablet    Refill:  3   clopidogrel  (PLAVIX ) 75 MG tablet    Sig: Take 1 tablet (75 mg total) by mouth daily.    Dispense:  90 tablet    Refill:  0   atorvastatin  (LIPITOR ) 40 MG tablet    Sig: Take 1 tablet (40 mg total) by mouth daily.    Dispense:  90 tablet    Refill:  0

## 2024-07-09 NOTE — Telephone Encounter (Signed)
 Pt mother called in highly upset about refills. She states she called [facility] and they only had two refills.   I called [facility] she said they will send out all pts medications this afternoon.

## 2024-07-09 NOTE — Telephone Encounter (Signed)
 ICD-10-CM   1. Heart failure with improved ejection fraction (HFimpEF) (HCC)  I50.20 spironolactone  (ALDACTONE ) 25 MG tablet    metoprolol  succinate (TOPROL -XL) 25 MG 24 hr tablet    dapagliflozin  propanediol (FARXIGA ) 10 MG TABS tablet    2. S/P CABG x 2  Z95.1 clopidogrel  (PLAVIX ) 75 MG tablet    atorvastatin  (LIPITOR ) 40 MG tablet     Meds ordered this encounter  Medications   spironolactone  (ALDACTONE ) 25 MG tablet    Sig: Take 0.5 tablets (12.5 mg total) by mouth daily.    Dispense:  45 tablet    Refill:  0   metoprolol  succinate (TOPROL -XL) 25 MG 24 hr tablet    Sig: Take 0.5 tablets (12.5 mg total) by mouth daily.    Dispense:  45 tablet    Refill:  0   dapagliflozin  propanediol (FARXIGA ) 10 MG TABS tablet    Sig: Take 1 tablet (10 mg total) by mouth daily.    Dispense:  30 tablet    Refill:  3   clopidogrel  (PLAVIX ) 75 MG tablet    Sig: Take 1 tablet (75 mg total) by mouth daily.    Dispense:  90 tablet    Refill:  0   atorvastatin  (LIPITOR ) 40 MG tablet    Sig: Take 1 tablet (40 mg total) by mouth daily.    Dispense:  90 tablet    Refill:  0    Redid the Rx as initial one did not go through E prescribe

## 2024-07-09 NOTE — Telephone Encounter (Signed)
 Will forward to triage to touch base with clinic admininistrator regarding mother's request for call back.   Please extend clinic apologies. I followed the trail of medication refills to see what happened. Patient called answering service on 12/15 and rx's were sent in for #30 for each med as requested by covering APP but the duration was selected as 2 days, unclear why this computer error occurred. The patient's family had a concomitant refill request sent same day and RMA stated that the patient called the answering service and refills were sent in under the presumption that request had been handled. Will cc to involved parties as FYI to be on the lookout for this in the future and notify if this occurs in another case.  Dr. Ladona sent in all refills this morning. Please make sure patient/mother are aware.   Triage - He also did not return for close follow-up as advised from last OV - got rescheduled to February initially then January. In November I wanted him seen back in 1-2 weeks but he has transportation issues. Please reschedule for ASAP f/u. Thank you.

## 2024-07-09 NOTE — Addendum Note (Signed)
 Addended by: LADONA MILAN on: 07/09/2024 06:30 AM   Modules accepted: Orders

## 2024-07-09 NOTE — Telephone Encounter (Signed)
 I called the patient and did not get a response. I left a message stating to give us  a call back. The patient's refills were put in and his follow up is for January 2026.

## 2024-07-09 NOTE — Telephone Encounter (Signed)
 Pt's medications were resent to [facility] outpatient pharmacy as requested by pharmacist. Confirmation received.,

## 2024-07-10 ENCOUNTER — Other Ambulatory Visit: Payer: Self-pay | Admitting: Thoracic Surgery (Cardiothoracic Vascular Surgery)

## 2024-07-10 ENCOUNTER — Other Ambulatory Visit (HOSPITAL_COMMUNITY): Payer: Self-pay

## 2024-07-10 DIAGNOSIS — Z951 Presence of aortocoronary bypass graft: Secondary | ICD-10-CM

## 2024-07-10 NOTE — Telephone Encounter (Addendum)
 Reviewed chart to ensure refills taken care of. Per chart review, old 3 day course of Lasix  inadvertantly refilled with batch. I contacted [facility] who confirmed meds are on hold and not yet filled - I asked them to discontinue Lasix  refill.  In phone note dated 12/16, I requested to please move up follow-up. Currently still listed as 08/15/24. At follow-up in 05/2024 he was supposed to return in 1-2 weeks but had issues at that time with transportation. It was subsequently rescheduled all the way out to February then end of January. Can we please work on moving this up to next available follow-up date? He relies on transportation so needs to be involved in desired day.  Can you also make sure that they know the subsequent refills of meds will be on hold at [facility] pharmacy when needed? Initial fills were sent to [facility] yesterday, but not sure if they were in the loop of transfer. I cannot tell who initiated the transfer but given the refill communication issues per 12/16 phone note, want to make sure everyone is on the same page going forward. Thank you so much!

## 2024-07-10 NOTE — Addendum Note (Signed)
 Addended by: Hermenia Fritcher N on: 07/10/2024 01:48 PM   Modules accepted: Orders

## 2024-07-16 ENCOUNTER — Ambulatory Visit (HOSPITAL_COMMUNITY)
Admission: RE | Admit: 2024-07-16 | Discharge: 2024-07-16 | Disposition: A | Discharge status: Home / Self Care | Source: Ambulatory Visit | Attending: Internal Medicine | Admitting: Internal Medicine

## 2024-07-16 ENCOUNTER — Ambulatory Visit

## 2024-07-16 VITALS — BP 120/77 | HR 80 | Resp 20 | Ht 72.0 in | Wt 193.0 lb

## 2024-07-16 DIAGNOSIS — Z951 Presence of aortocoronary bypass graft: Secondary | ICD-10-CM | POA: Diagnosis present

## 2024-07-16 MED ORDER — LIDOCAINE 5 % EX PTCH: 1.0000 | MEDICATED_PATCH | CUTANEOUS | 0 refills | Status: AC

## 2024-07-16 NOTE — Progress Notes (Signed)
 "     [address] [place] [address]             [phone]       HPI:  Patient returns for routine postoperative follow-up having undergone CABG X 2, LIMA D-1, RSVG OM, endoscopic greater saphenous vein harvest on the right, central ECMO cannulation on 05/28/2024 with Dr. Shyrl. He required Impella placement on 05/29/2024 and removal on 06/03/2024.   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 He required initiation of VA ECMO. An Impella 5.5 was then placed on 05/29/2024. He was able to be weaned off Impella support and on 06/03/2024 it was removed. He was started on metoprolol .  He was stable for discharge home on 06/05/2024.   He presents to the clinic today with his mom and he reports that he has been doing well.  He has some incisional pain along his right chest.  This has been improving with time.  He also had some pain between his scapula which he was prescribed a muscle relaxer for.  This has not helped much.  He has been active and has been walking 30 minutes a day.  His incision sites have been healing appropriately. He denies chest pain, shortness of breath and lower leg swelling.    Allergies as of 07/16/2024       Reactions   Tylenol  [acetaminophen ] Other (See Comments)   Cold sweats        Medication List        Accurate as of July 16, 2024 12:27 PM. If you have any questions, ask your nurse or doctor.          Aspirin  Low Dose 81 MG chewable tablet Generic drug: aspirin  Chew 1 tablet (81 mg total) by mouth daily.   atorvastatin  40 MG tablet Commonly known as: LIPITOR  Take 1 tablet (40 mg total) by mouth daily.   Cabenuva  600 & 900 MG/3ML injection Generic drug: cabotegravir  & rilpivirine  ER Inject 1 kit into the muscle every 2 (two) months.   clopidogrel  75 MG tablet Commonly known as: PLAVIX  Take 1 tablet (75 mg total)  by mouth daily.   dapagliflozin  propanediol 10 MG Tabs tablet Commonly known as: FARXIGA  Take 1 tablet (10 mg total) by mouth daily.   lidocaine  5 % Commonly known as: Lidoderm  Place 1 patch onto the skin daily. Remove & Discard patch within 12 hours or as directed by MD   metoprolol  succinate 25 MG 24 hr tablet Commonly known as: TOPROL -XL Take 1/2 tablet (12.5 mg total) by mouth daily.   spironolactone  25 MG tablet Commonly known as: ALDACTONE  Take 1/2 tablet (12.5 mg total) by mouth daily.         ROS Review of Systems  Constitutional: Negative.  Negative for fever and malaise/fatigue.  Respiratory:  Negative for cough and shortness of breath.   Cardiovascular:  Negative for chest pain, palpitations and leg swelling.      BP 120/77   Pulse 80   Resp 20   Ht 6' (1.829 m)   Wt 193 lb (87.5 kg)   SpO2 100% Comment: RA  BMI 26.18 kg/m   Physical Exam Constitutional:      Appearance: Normal appearance.  HENT:     Head: Normocephalic and atraumatic.  Cardiovascular:     Rate and Rhythm: Normal rate and regular rhythm.     Heart  sounds: Normal heart sounds, S1 normal and S2 normal.  Pulmonary:     Effort: Pulmonary effort is normal.     Breath sounds: Normal breath sounds.  Skin:    General: Skin is warm and dry.      Neurological:     General: No focal deficit present.     Mental Status: He is alert and oriented to person, place, and time.       Imaging: CLINICAL DATA:  CABG.   EXAM: CHEST - 2 VIEW   COMPARISON:  June 18, 2024   FINDINGS: Multiple sternal wires and vascular clips are noted. The heart size and mediastinal contours are within normal limits. Radiopaque surgical clips are seen overlying the lateral aspect of the upper right lung. Both lungs are otherwise clear. The visualized skeletal structures are unremarkable.   IMPRESSION: 1. Evidence of prior median sternotomy/CABG. 2. No acute cardiopulmonary disease.      Electronically Signed   By: Suzen Dials M.D.   On: 07/16/2024 10:35   Assessment/Plan:  S/P CABG x 2 -We reviewed today's chest x ray. ** -We discussed driving and he is able to start. First time driving should be a short distance in the day time and he can increase from there -Discussed participation in cardiac rehab and he is cleared at this time -He is 6 week from surgery so sternal precautions can be lifted. He should continue to not lift over 10 pounds until a full 3 months from surgery -He is to continue to increase his activity as tolerated  -He has been seen by cardiology and he is to continue with aspirin  81 mg, Plavix  75 mg, Farxiga  10 mg, Toprol  12.5 mg, and spironolactone  12.5 mg.    -Prescribed topical lidocaine  patches for patient to try for back pain between his scapula. This should continue to improve with time.  -Follow up with [facility] as needed   Manuelita CHRISTELLA Rough, PA-C 12:27 PM 07/16/2024  "

## 2024-07-16 NOTE — Patient Instructions (Signed)

## 2024-08-01 ENCOUNTER — Encounter (HOSPITAL_COMMUNITY): Payer: Self-pay

## 2024-08-01 ENCOUNTER — Other Ambulatory Visit: Payer: Self-pay

## 2024-08-01 ENCOUNTER — Emergency Department (HOSPITAL_COMMUNITY)
Admission: EM | Admit: 2024-08-01 | Discharge: 2024-08-01 | Discharge status: Against Medical Advice | Attending: Emergency Medicine | Admitting: Emergency Medicine

## 2024-08-01 DIAGNOSIS — Z5321 Procedure and treatment not carried out due to patient leaving prior to being seen by health care provider: Secondary | ICD-10-CM | POA: Diagnosis not present

## 2024-08-01 DIAGNOSIS — Z760 Encounter for issue of repeat prescription: Secondary | ICD-10-CM | POA: Insufficient documentation

## 2024-08-01 NOTE — ED Triage Notes (Signed)
 POV/ ambulatory/ needs medication refill for baclofen, does not remember who PCP is and who prescribed meds/ no complaints at this time

## 2024-08-01 NOTE — ED Notes (Signed)
 Pt called x3 for vitals and registration, no response. Unable to locate in lobby.

## 2024-08-14 NOTE — Progress Notes (Unsigned)
 " Cardiology Office Note:    Date:  08/14/2024   ID:  Thomas [name], DOB 29-Jan-1981, MRN [id number]  PCP:  Raymond Tyron Roers, MD   [facility] Providers Cardiologist:  [name], MD { Click to update primary MD,subspecialty MD or APP then REFRESH:1}    Referring MD: Raymond Tyron Roers, MD   Chief complaint: 38-month follow-up     History of Present Illness:   Thomas [name] is a 44 y.o. male with a hx of CAD (cardiac arrest in 2015 s/p PCI to OM1/Cx, NSTEMI with diffuse CAD/coronary vasospasm managed medically, lateral MI 05/2024 with POBA to ostial LAD with subsequent CABG, post-cardiotomy shock), mitral regurgitation, HIV, CKD 3b, prior right nephrectomy due to complications from kidney stone, chronic hydronephrosis, h/o Hodgkin's lymphoma treated with chemotherapy seen for follow-up.   Admitted 05/2024 d/t CP and EKG changes.  LHC 05/27/2024: PTCA to 99% LAD stenosis with placement of IABP for stabilization and referral to CABG. Underwent CABGx2 05/28/24 with LIMA-D1, RSVG-OM with post cardiotomy shock requiing ECMO, Impella, pressors. He was followed by multidiscplinary team, also treated with course of abx and followed for AKI on CKD. CIR recommended and patient declined. Atorvastatin  was discontinued due to elevated LFTs felt due to shock liver with consideration to revisit as OP. Did not require abx at discharge. Pre-op echo 05/27/24 showed EF 30-35%. Repeat echo during stay 06/04/24 EF 50-55% + distal anterior/anterolateral and apical hypokinesis, moderate MR felt ischemic in etiology.   Most recent follow-up outpatient with Dayna Dunn PA-C on 06/18/2024 patient was found to be doing well at that time, with mild continued soreness of chest wall with slight positional changes.  No anginal complaints.  Taking meds as prescribed.  There was some concern for pneumonia with abnormal lung sounds, mild tachycardia.  CXR 06/18/2024 demonstrated no  acute findings concerning for pneumonia, no leukocytosis, however proBNP was 1063.  Treated with Lasix .  Close follow-up was recommended in 2-3 weeks, however was not completed due to problems with patient transportation.  Was subsequently scheduled for follow-up in late February, earlier appointment requested, was placed on my schedule for today.    ROS:   Please see the history of present illness.    *** All other systems reviewed and are negative.     Past Medical History:  Diagnosis Date   Acute respiratory failure with hypoxemia: post cardiac arrest 05/19/2014   Cancer (HCC)    hodgkins, no current treatment for   Cardiac arrest (HCC) 05/15/2014   Chronic renal insufficiency    CKD (chronic kidney disease) s/p Right nephrectomy (04/30/2014) 05/19/2014   CLABSI (central line-associated bloodstream infection) 02/27/2014   Enteritis 01/22/2014   History of blood transfusion july 2015 per pt   HIV disease (HCC) 06/25/2012   Hydronephrosis, right & hydroureter: secondary to obstructing ureteral calculi 03/01/2014   Leg pain 07/03/2012   Nephrolithiasis    NSTEMI (non-ST elevated myocardial infarction) (HCC) 05/19/2014   Obstructive uropathy 12/12/2014   Status post left renal stent   Penile cyst 08/06/2012   Septic shock (HCC) 01/18/2014   Shingles rash 02/25/2014   Sinus tachycardia 08/06/2012   Skin lesion 07/03/2012   Staphylococcus aureus bacteremia 02/25/2014   Transaminitis 05/19/2014    Past Surgical History:  Procedure Laterality Date   ABCESS DRAINAGE  08/2008   drainage of perirectal 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  this after several previous I and Ds of rectal abcess.    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   CANNULATION FOR ECMO (EXTRACORPOREAL MEMBRANE OXYGENATION)  05/28/2024   Procedure: CANNULATION FOR ECMO (EXTRACORPOREAL MEMBRANE OXYGENATION);  Surgeon: Shyrl Linnie KIDD, MD;  Location: [facility] OR;  Service: Open Heart Surgery;;   CARDIAC  CATHETERIZATION  05/19/2014   CARDIAC CATHETERIZATION N/A 10/28/2015   Procedure: Left Heart Cath and Coronary Angiography;  Surgeon: Gordy Bergamo, MD;  Location: [facility] INVASIVE CV LAB;  Service: Cardiovascular;  Laterality: N/A;   CORONARY ARTERY BYPASS GRAFT N/A 05/28/2024   Procedure: CORONARY ARTERY BYPASS GRAFTING (CABG) TIMES TWO, UTILIZING LEFT INTERNAL MAMMARY ARTERY AND ENDOVEIN HARVEST RIGHT GREATER SAPHENOUS VEIN;  Surgeon: Shyrl Linnie KIDD, MD;  Location: [facility] OR;  Service: Open Heart Surgery;  Laterality: N/A;   CORONARY STENT PLACEMENT  05/19/2014   OMI   & PROXIMAL CIRCUMFLEX   CORONARY/GRAFT ACUTE MI REVASCULARIZATION N/A 05/27/2024   Procedure: Coronary/Graft Acute MI Revascularization;  Surgeon: Elmira Newman PARAS, MD;  Location: [facility] INVASIVE CV LAB;  Service: Cardiovascular;  Laterality: N/A;   CYSTOSCOPY WITH RETROGRADE PYELOGRAM, URETEROSCOPY AND STENT PLACEMENT Left 12/11/2014   Procedure: CYSTOSCOPY WITH RETROGRADE PYELOGRAM, URETEROSCOPY, STONE REMOVAL, AND STENT PLACEMENT;  Surgeon: Mark Ottelin, MD;  Location: [facility] ORS;  Service: Urology;  Laterality: Left;   HOLMIUM LASER APPLICATION Left 12/11/2014   Procedure: HOLMIUM LASER APPLICATION;  Surgeon: Mark Ottelin, MD;  Location: [facility] ORS;  Service: Urology;  Laterality: Left;   IABP INSERTION N/A 05/27/2024   Procedure: IABP Insertion;  Surgeon: Elmira Newman PARAS, MD;  Location: [facility] INVASIVE CV LAB;  Service: Cardiovascular;  Laterality: N/A;   INTRAOPERATIVE TRANSESOPHAGEAL ECHOCARDIOGRAM N/A 06/03/2024   Procedure: ECHOCARDIOGRAM, TRANSESOPHAGEAL, INTRAOPERATIVE;  Surgeon: Daniel Con RAMAN, MD;  Location: [facility] OR;  Service: Open Heart Surgery;  Laterality: N/A;   LAPAROSCOPIC NEPHRECTOMY Right 04/30/2014   Procedure: LAPAROSCOPIC NEPHRECTOMY AND URETERECTOMY;  Surgeon: Gretel Ferrara, MD;  Location: [facility] ORS;  Service: Urology;  Laterality: Right;   LEFT HEART CATH AND CORONARY ANGIOGRAPHY N/A 05/27/2024   Procedure: LEFT HEART CATH AND  CORONARY ANGIOGRAPHY;  Surgeon: Elmira Newman PARAS, MD;  Location: [facility] INVASIVE CV LAB;  Service: Cardiovascular;  Laterality: N/A;   LEFT HEART CATHETERIZATION WITH CORONARY ANGIOGRAM N/A 05/18/2014   Procedure: LEFT HEART CATHETERIZATION WITH CORONARY ANGIOGRAM;  Surgeon: Erick JONELLE Bergamo, MD;  Location: [facility] CATH LAB;  Service: Cardiovascular;  Laterality: N/A;   pac placed and later removed     PERCUTANEOUS CORONARY STENT INTERVENTION (PCI-S) N/A 05/19/2014   Procedure: PERCUTANEOUS CORONARY STENT INTERVENTION (PCI-S);  Surgeon: Erick JONELLE Bergamo, MD;  Location: [facility] CATH LAB;  Service: Cardiovascular;  Laterality: N/A;   PLACEMENT OF IMPELLA LEFT VENTRICULAR ASSIST DEVICE Right 05/29/2024   Procedure: INSERTION OF CARDIAC ASSIST DEVICE, IMPELLA 5.5;  Surgeon: Shyrl Linnie KIDD, MD;  Location: [facility] OR;  Service: Open Heart Surgery;  Laterality: Right;   REMOVAL OF IMPELLA LEFT VENTRICULAR ASSIST DEVICE N/A 06/03/2024   Procedure: REMOVAL, CARDIAC ASSIST DEVICE, IMPELLA;  Surgeon: Daniel Con RAMAN, MD;  Location: [facility] OR;  Service: Open Heart Surgery;  Laterality: N/A;   right nephrostomy tube  for last month and 1/2   SUPRACLAVICAL NODE BIOPSY Right 08/08/2013   Procedure: SUPRACLAVICULAR LYMPH NODE BIOPSY;  Surgeon: Dorise MARLA Fellers, MD;  Location: [facility] OR;  Service: Thoracic;  Laterality: Right;   TEE WITHOUT CARDIOVERSION N/A 02/27/2014   Procedure: TRANSESOPHAGEAL ECHOCARDIOGRAM (TEE);  Surgeon: Vinie KYM Maxcy, MD;  Location: [facility] ENDOSCOPY;  Service: Cardiovascular;  Laterality: N/A;   TEE WITHOUT CARDIOVERSION N/A 05/28/2024   Procedure: ECHOCARDIOGRAM,  TRANSESOPHAGEAL;  Surgeon: Shyrl Linnie KIDD, MD;  Location: [facility] OR;  Service: Open Heart Surgery;  Laterality: N/A;    Current Medications: Active Medications[1]   Allergies:   Tylenol  [acetaminophen ]   Social History   Socioeconomic History   Marital status: Single    Spouse name: Not on file   Number of children: 0   Years of education:  Not on file   Highest education level: Not on file  Occupational History   Not on file  Tobacco Use   Smoking status: Former    Types: Cigarettes   Smokeless tobacco: Never   Tobacco comments:    quitline info, counseling  Vaping Use   Vaping status: Never Used  Substance and Sexual Activity   Alcohol use: No   Drug use: No    Types: Marijuana    Comment: hasn't smoked marijuana in a while.    Sexual activity: Not Currently    Partners: Female    Birth control/protection: Condom    Comment: given condoms  Other Topics Concern   Not on file  Social History Narrative   Not on file   Social Drivers of Health   Tobacco Use: Medium Risk (08/01/2024)   Patient History    Smoking Tobacco Use: Former    Smokeless Tobacco Use: Never    Passive Exposure: Not on file  Financial Resource Strain: Low Risk (06/24/2024)   Overall Financial Resource Strain (CARDIA)    Difficulty of Paying Living Expenses: Not very hard  Food Insecurity: No Food Insecurity (06/24/2024)   Epic    Worried About Programme Researcher, Broadcasting/film/video in the Last Year: Never true    Ran Out of Food in the Last Year: Never true  Transportation Needs: Unmet Transportation Needs (06/24/2024)   Epic    Lack of Transportation (Medical): Yes    Lack of Transportation (Non-Medical): Yes  Physical Activity: Not on file  Stress: Not on file  Social Connections: Unknown (12/06/2021)   Received from [facility]   Social Network    Social Network: Not on file  Depression (PHQ2-9): Low Risk (04/30/2024)   Depression (PHQ2-9)    PHQ-2 Score: 0  Alcohol Screen: Not on file  Housing: Low Risk (06/24/2024)   Epic    Unable to Pay for Housing in the Last Year: No    Number of Times Moved in the Last Year: 1    Homeless in the Last Year: No  Utilities: Not At Risk (06/24/2024)   Epic    Threatened with loss of utilities: No  Health Literacy: Adequate Health Literacy (06/24/2024)   B1300 Health Literacy    Frequency of need for help  with medical instructions: Rarely     Family History: The patient's ***family history includes Diabetes in his mother.  EKGs/Labs/Other Studies Reviewed:    The following studies were reviewed today: ***      Recent Labs: 06/05/2024: Magnesium  1.9 06/18/2024: ALT 21; ALT 18; BUN 11; BUN 11; Creatinine, Ser 1.72; Creatinine, Ser 1.60; Hemoglobin 12.1; NT-Pro BNP 1,063; Platelets 416; Potassium 3.8; Potassium 3.9; Sodium 140; Sodium 139; TSH 3.200  Recent Lipid Panel    Component Value Date/Time   CHOL 195 05/27/2024 1139   CHOL 223 (H) 12/03/2018 1330   TRIG 199 (H) 06/04/2024 0435   HDL 46 05/27/2024 1139   HDL 37 (L) 12/03/2018 1330   CHOLHDL 4.2 05/27/2024 1139   VLDL 23 05/27/2024 1139   LDLCALC 126 (H) 05/27/2024 1139   LDLCALC 150 (  H) 09/03/2023 1358     Risk Assessment/Calculations:   {Does this patient have ATRIAL FIBRILLATION?:[phone]}  No BP recorded.  {Refresh Note OR Click here to enter BP  :1}***         Physical Exam:    VS:  There were no vitals taken for this visit.       Wt Readings from Last 3 Encounters:  08/01/24 189 lb 9.5 oz (86 kg)  07/16/24 193 lb (87.5 kg)  06/18/24 186 lb (84.4 kg)     GEN: *** Well nourished, well developed in no acute distress HEENT: Normal NECK: No JVD; No carotid bruits CARDIAC: *** S1-S2 normal, RRR, no murmurs, rubs, gallops RESPIRATORY:  Clear to auscultation without rales, wheezing or rhonchi  MUSCULOSKELETAL:  No edema; No deformity  SKIN: Warm and dry NEUROLOGIC:  Alert and oriented x 3 PSYCHIATRIC:  Normal affect       Assessment & Plan   Disposition: *** Route to primary cardiologist  {The patient has an active order for outpatient cardiac rehabilitation.   Please indicate if the patient is ready to start. Do NOT delete this.  It will auto delete.  Refresh note, then sign.              Click here to document readiness and see contraindications.  :1}  Cardiac Rehabilitation Eligibility  Assessment       {Are you ordering a CV Procedure (e.g. stress test, cath, DCCV, TEE, etc)?   Press F2        :[id number]}    Medication Adjustments/Labs and Tests Ordered: Current medicines are reviewed at length with the patient today.  Concerns regarding medicines are outlined above.  No orders of the defined types were placed in this encounter.  No orders of the defined types were placed in this encounter.   There are no Patient Instructions on file for this visit.   Signed, Diem Dicocco E Costella Schwarz, NP  08/14/2024 6:32 PM    [facility]    [1]  No outpatient medications have been marked as taking for the 08/15/24 encounter (Appointment) with Rolene Andrades E, NP.   "

## 2024-08-15 ENCOUNTER — Ambulatory Visit: Admitting: Emergency Medicine

## 2024-08-18 ENCOUNTER — Other Ambulatory Visit (HOSPITAL_COMMUNITY): Payer: Self-pay

## 2024-08-18 ENCOUNTER — Other Ambulatory Visit: Payer: Self-pay

## 2024-08-18 NOTE — Progress Notes (Signed)
 Specialty Pharmacy Refill Coordination Note  Thomas [name] is a 44 y.o. male assessed today regarding refills of clinic administered specialty medication(s) Cabotegravir  & Rilpivirine  (CABENUVA )   Clinic requested Courier to Provider Office   Delivery date: 08/25/24   Verified address: [address] [place] [address]   Medication will be filled on 08/22/24.

## 2024-08-22 ENCOUNTER — Other Ambulatory Visit: Payer: Self-pay

## 2024-08-26 ENCOUNTER — Other Ambulatory Visit: Payer: Self-pay

## 2024-08-26 ENCOUNTER — Telehealth: Payer: Self-pay

## 2024-08-26 ENCOUNTER — Ambulatory Visit: Admitting: Pharmacist

## 2024-08-26 DIAGNOSIS — B20 Human immunodeficiency virus [HIV] disease: Secondary | ICD-10-CM | POA: Diagnosis not present

## 2024-08-26 MED ORDER — CABOTEGRAVIR & RILPIVIRINE ER 600 & 900 MG/3ML IM SUER
1.0000 | Freq: Once | INTRAMUSCULAR | Status: AC
  Administered 2024-08-26: 1 via INTRAMUSCULAR

## 2024-08-26 NOTE — Progress Notes (Signed)
 "  HPI: Thomas [name] is a 44 y.o. male who presents to the [facility] pharmacy clinic for Cabenuva  administration.  Referring ID Provider: Dr. Overton  Patient Active Problem List   Diagnosis Date Noted   Aspiration pneumonia of both lower lobes (HCC) 06/04/2024   Palliative care by specialist 06/04/2024   Malnutrition of moderate degree 06/02/2024   Acute respiratory failure with hypoxia (HCC) 05/30/2024   Mucus plug in respiratory tract 05/29/2024   Cardiogenic shock (HCC) 05/28/2024   Patient receiving ECMO 05/28/2024   S/P CABG x 2 05/28/2024   On mechanically assisted ventilation (HCC) 05/28/2024   ABLA (acute blood loss anemia) 05/28/2024   Thrombocytopenia 05/28/2024   STEMI involving left anterior descending coronary artery (HCC) 05/27/2024   Need for prophylactic vaccination and inoculation against influenza 05/09/2021   Encounter for long-term (current) use of high-risk medication 08/16/2020   Medication monitoring encounter 03/06/2017   Screening examination for venereal disease 02/24/2016   Acute coronary syndrome (HCC) 10/27/2015   Unstable angina (HCC) 10/27/2015   Obstructive uropathy 12/12/2014   Nephrolithiasis 12/12/2014   Hyperlipidemia 12/10/2014   CKD (chronic kidney disease) s/p Right nephrectomy (04/30/2014) 05/19/2014   Elevated transaminase level 05/19/2014   Renal neoplasm 04/30/2014   Hypokalemia 01/22/2014   Hodgkin's disease (HCC) 08/26/2013   Iron deficiency anemia, unspecified 08/04/2013   Tachycardia 08/06/2012   Penile cyst 08/06/2012   HIV disease (HCC) 06/25/2012    Patient's Medications  New Prescriptions   No medications on file  Previous Medications   ASPIRIN  81 MG CHEWABLE TABLET    Chew 1 tablet (81 mg total) by mouth daily.   ATORVASTATIN  (LIPITOR ) 40 MG TABLET    Take 1 tablet (40 mg total) by mouth daily.   CABOTEGRAVIR  & RILPIVIRINE  ER (CABENUVA ) 600 & 900 MG/3ML INJECTION    Inject 1 kit into the muscle every 2 (two) months.    CLOPIDOGREL  (PLAVIX ) 75 MG TABLET    Take 1 tablet (75 mg total) by mouth daily.   DAPAGLIFLOZIN  PROPANEDIOL (FARXIGA ) 10 MG TABS TABLET    Take 1 tablet (10 mg total) by mouth daily.   LIDOCAINE  (LIDODERM ) 5 %    Place 1 patch onto the skin daily. Remove & Discard patch within 12 hours or as directed by MD   METOPROLOL  SUCCINATE (TOPROL -XL) 25 MG 24 HR TABLET    Take 1/2 tablet (12.5 mg total) by mouth daily.   SPIRONOLACTONE  (ALDACTONE ) 25 MG TABLET    Take 1/2 tablet (12.5 mg total) by mouth daily.  Modified Medications   No medications on file  Discontinued Medications   No medications on file    Allergies: Allergies[1]  Labs: Lab Results  Component Value Date   HIV1RNAQUANT NOT DETECTED 02/26/2024   HIV1RNAQUANT Not Detected 09/03/2023   HIV1RNAQUANT Not Detected 06/25/2023   CD4TABS 686 02/26/2024   CD4TABS 929 05/17/2023   CD4TABS 942 09/21/2022    RPR and STI Lab Results  Component Value Date   LABRPR REACTIVE (A) 09/03/2023   LABRPR REACTIVE (A) 05/17/2023   LABRPR REACTIVE (A) 09/21/2022   LABRPR NON-REACTIVE 08/16/2020   LABRPR NON-REACTIVE 02/17/2019   RPRTITER 1:16 (H) 09/03/2023   RPRTITER 1:16 (H) 05/17/2023   RPRTITER 1:32 (H) 09/21/2022    STI Results GC GC CT CT  Latest Ref Rng & Units  NEGATIVE  NEGATIVE  05/17/2023  9:52 AM Negative    Negative   Negative    Negative    09/21/2022  9:37 AM Negative  Negative    08/16/2020 10:14 AM Negative   Negative    09/06/2017 12:00 AM Negative   Negative    02/16/2016 12:00 AM Negative   Negative    12/30/2015 12:00 AM Negative   Negative    02/23/2014  9:46 PM  NEGATIVE   NEGATIVE   08/03/2013  2:04 PM  NEGATIVE   NEGATIVE     Hepatitis B Lab Results  Component Value Date   HEPBSAB POS (A) 06/06/2012   HEPBSAG NEGATIVE 05/19/2014   HEPBCAB NEG 06/06/2012   Hepatitis C No results found for: HEPCAB, HCVRNAPCRQN Hepatitis A Lab Results  Component Value Date   HAV NEG 06/06/2012    Lipids: Lab Results  Component Value Date   CHOL 195 05/27/2024   TRIG 199 (H) 06/04/2024   HDL 46 05/27/2024   CHOLHDL 4.2 05/27/2024   VLDL 23 05/27/2024   LDLCALC 126 (H) 05/27/2024    Target Date: The   Assessment: Thomas [name] presents today for his maintenance Cabenuva  injections. Past injections were tolerated well without issues. Last HIV RNA was not detected in August. Doing well with no issues today.  Lab work:  None today  Cabenuva : Administered cabotegravir  600mg /30mL in left upper outer quadrant of the gluteal muscle. Administered rilpivirine  900 mg/3mL in the right upper outer quadrant of the gluteal muscle. No issues with injections. He will follow up in 2 months for next set of injections.  Plan: - Cabenuva  injections administered - Next injections scheduled for 10/28/24 with [name] (Dr. Overton unavailable) and 12/24/24 with [name] - Call with any issues or questions  Yanil Dawe L. Anushree Dorsi, PharmD, BCIDP, AAHIVP, CPP Clinical Pharmacist Practitioner - Infectious Diseases Clinical Pharmacist Lead - Specialty Pharmacy [facility] for Infectious Disease     [1]  Allergies Allergen Reactions   Tylenol  [Acetaminophen ] Other (See Comments)    Cold sweats   "

## 2024-08-26 NOTE — Telephone Encounter (Signed)
 [facility] Patient Advocate Encounter  Patient's medications CABENUVA  have been couriered to [facility] from [facility] and will be administered at the patients appointment on 08/26/24.  Charmaine Sharps, CPhT Specialty Pharmacy Patient [facility] for Infectious Disease Phone: [phone] Fax:  [phone]

## 2024-08-27 ENCOUNTER — Ambulatory Visit: Admitting: Cardiology

## 2024-10-01 ENCOUNTER — Ambulatory Visit: Admitting: Emergency Medicine

## 2024-10-28 ENCOUNTER — Ambulatory Visit: Payer: Self-pay | Admitting: Infectious Diseases

## 2024-12-24 ENCOUNTER — Ambulatory Visit: Payer: Self-pay | Admitting: Pharmacist
